# Patient Record
Sex: Female | Born: 1935 | Race: White | Hispanic: No | Marital: Married
Health system: Southern US, Community
[De-identification: ages and names within clinical notes are randomized; demographics above are authoritative.]

## PROBLEM LIST (undated history)

## (undated) ENCOUNTER — Emergency Department (HOSPITAL_COMMUNITY): Admission: EM | Disposition: A | Discharge status: Home / Self Care | Payer: Medicare PPO

## (undated) DIAGNOSIS — K7581 Nonalcoholic steatohepatitis (NASH): Secondary | ICD-10-CM

## (undated) DIAGNOSIS — I495 Sick sinus syndrome: Secondary | ICD-10-CM

## (undated) DIAGNOSIS — I619 Nontraumatic intracerebral hemorrhage, unspecified: Secondary | ICD-10-CM

## (undated) DIAGNOSIS — I639 Cerebral infarction, unspecified: Secondary | ICD-10-CM

## (undated) DIAGNOSIS — I071 Rheumatic tricuspid insufficiency: Secondary | ICD-10-CM

## (undated) DIAGNOSIS — K625 Hemorrhage of anus and rectum: Secondary | ICD-10-CM

## (undated) DIAGNOSIS — K649 Unspecified hemorrhoids: Secondary | ICD-10-CM

## (undated) DIAGNOSIS — Z950 Presence of cardiac pacemaker: Secondary | ICD-10-CM

## (undated) DIAGNOSIS — R600 Localized edema: Secondary | ICD-10-CM

## (undated) DIAGNOSIS — Z9289 Personal history of other medical treatment: Secondary | ICD-10-CM

## (undated) DIAGNOSIS — Z87440 Personal history of urinary (tract) infections: Secondary | ICD-10-CM

## (undated) DIAGNOSIS — E785 Hyperlipidemia, unspecified: Secondary | ICD-10-CM

## (undated) DIAGNOSIS — M353 Polymyalgia rheumatica: Secondary | ICD-10-CM

## (undated) DIAGNOSIS — E039 Hypothyroidism, unspecified: Secondary | ICD-10-CM

## (undated) DIAGNOSIS — I712 Thoracic aortic aneurysm, without rupture, unspecified: Secondary | ICD-10-CM

## (undated) DIAGNOSIS — Z860101 Personal history of adenomatous and serrated colon polyps: Secondary | ICD-10-CM

## (undated) DIAGNOSIS — M1990 Unspecified osteoarthritis, unspecified site: Secondary | ICD-10-CM

## (undated) DIAGNOSIS — I4821 Permanent atrial fibrillation: Secondary | ICD-10-CM

## (undated) DIAGNOSIS — I872 Venous insufficiency (chronic) (peripheral): Secondary | ICD-10-CM

## (undated) DIAGNOSIS — K759 Inflammatory liver disease, unspecified: Secondary | ICD-10-CM

## (undated) DIAGNOSIS — Z8673 Personal history of transient ischemic attack (TIA), and cerebral infarction without residual deficits: Secondary | ICD-10-CM

## (undated) DIAGNOSIS — Z87442 Personal history of urinary calculi: Secondary | ICD-10-CM

## (undated) DIAGNOSIS — I2510 Atherosclerotic heart disease of native coronary artery without angina pectoris: Secondary | ICD-10-CM

## (undated) DIAGNOSIS — G709 Myoneural disorder, unspecified: Secondary | ICD-10-CM

## (undated) DIAGNOSIS — R001 Bradycardia, unspecified: Secondary | ICD-10-CM

## (undated) DIAGNOSIS — I499 Cardiac arrhythmia, unspecified: Secondary | ICD-10-CM

## (undated) DIAGNOSIS — I5032 Chronic diastolic (congestive) heart failure: Secondary | ICD-10-CM

## (undated) DIAGNOSIS — Z86010 Personal history of colonic polyps: Secondary | ICD-10-CM

## (undated) DIAGNOSIS — N390 Urinary tract infection, site not specified: Secondary | ICD-10-CM

## (undated) DIAGNOSIS — I10 Essential (primary) hypertension: Secondary | ICD-10-CM

## (undated) HISTORY — PX: HERNIA REPAIR: SHX51

## (undated) HISTORY — DX: Cerebral infarction, unspecified: I63.9

## (undated) HISTORY — DX: Thoracic aortic aneurysm, without rupture, unspecified: I71.20

## (undated) HISTORY — DX: Bradycardia, unspecified: R00.1

## (undated) HISTORY — PX: BREAST SURGERY: SHX581

## (undated) HISTORY — PX: CARDIAC CATHETERIZATION: SHX172

## (undated) HISTORY — DX: Atherosclerotic heart disease of native coronary artery without angina pectoris: I25.10

## (undated) HISTORY — PX: CARDIAC PACEMAKER PLACEMENT: SHX583

## (undated) HISTORY — DX: Localized edema: R60.0

## (undated) HISTORY — DX: Personal history of other medical treatment: Z92.89

## (undated) HISTORY — DX: Presence of cardiac pacemaker: Z95.0

## (undated) HISTORY — DX: Rheumatic tricuspid insufficiency: I07.1

## (undated) HISTORY — PX: UMBILICAL HERNIA REPAIR: SHX196

## (undated) HISTORY — DX: Permanent atrial fibrillation: I48.21

## (undated) HISTORY — DX: Chronic diastolic (congestive) heart failure: I50.32

## (undated) HISTORY — DX: Polymyalgia rheumatica: M35.3

## (undated) HISTORY — DX: Nonalcoholic steatohepatitis (NASH): K75.81

## (undated) HISTORY — DX: Essential (primary) hypertension: I10

## (undated) HISTORY — DX: Thoracic aortic aneurysm, without rupture: I71.2

## (undated) HISTORY — PX: CYSTOSCOPY WITH RETROGRADE PYELOGRAM, URETEROSCOPY AND STENT PLACEMENT: SHX5789

## (undated) HISTORY — DX: Hyperlipidemia, unspecified: E78.5

---

## 1971-07-16 HISTORY — PX: INCISION AND DRAINAGE BREAST ABSCESS: SUR672

## 1998-03-08 ENCOUNTER — Other Ambulatory Visit: Admission: RE | Admit: 1998-03-08 | Discharge: 1998-03-08 | Payer: Self-pay | Admitting: Obstetrics and Gynecology

## 1999-02-23 ENCOUNTER — Encounter: Payer: Self-pay | Admitting: Family Medicine

## 1999-02-23 ENCOUNTER — Ambulatory Visit (HOSPITAL_COMMUNITY): Admission: RE | Admit: 1999-02-23 | Discharge: 1999-02-23 | Payer: Self-pay | Admitting: Family Medicine

## 1999-02-23 ENCOUNTER — Other Ambulatory Visit: Admission: RE | Admit: 1999-02-23 | Discharge: 1999-02-23 | Payer: Self-pay | Admitting: Family Medicine

## 1999-07-12 ENCOUNTER — Encounter: Admission: RE | Admit: 1999-07-12 | Discharge: 1999-07-12 | Payer: Self-pay | Admitting: Family Medicine

## 1999-07-12 ENCOUNTER — Encounter: Payer: Self-pay | Admitting: Family Medicine

## 1999-11-22 ENCOUNTER — Ambulatory Visit (HOSPITAL_COMMUNITY): Admission: RE | Admit: 1999-11-22 | Discharge: 1999-11-22 | Payer: Self-pay | Admitting: Family Medicine

## 1999-11-22 ENCOUNTER — Encounter: Payer: Self-pay | Admitting: Family Medicine

## 2000-01-08 ENCOUNTER — Encounter: Payer: Self-pay | Admitting: Family Medicine

## 2000-01-08 ENCOUNTER — Encounter: Admission: RE | Admit: 2000-01-08 | Discharge: 2000-01-08 | Payer: Self-pay | Admitting: Family Medicine

## 2000-02-26 ENCOUNTER — Other Ambulatory Visit: Admission: RE | Admit: 2000-02-26 | Discharge: 2000-02-26 | Payer: Self-pay | Admitting: Family Medicine

## 2001-01-13 ENCOUNTER — Encounter: Admission: RE | Admit: 2001-01-13 | Discharge: 2001-01-13 | Payer: Self-pay | Admitting: Family Medicine

## 2001-01-13 ENCOUNTER — Encounter: Payer: Self-pay | Admitting: Family Medicine

## 2001-02-26 ENCOUNTER — Other Ambulatory Visit: Admission: RE | Admit: 2001-02-26 | Discharge: 2001-02-26 | Payer: Self-pay | Admitting: Family Medicine

## 2001-02-27 ENCOUNTER — Encounter: Admission: RE | Admit: 2001-02-27 | Discharge: 2001-02-27 | Payer: Self-pay | Admitting: Family Medicine

## 2001-02-27 ENCOUNTER — Encounter: Payer: Self-pay | Admitting: Family Medicine

## 2002-01-20 ENCOUNTER — Encounter: Admission: RE | Admit: 2002-01-20 | Discharge: 2002-01-20 | Payer: Self-pay | Admitting: Family Medicine

## 2002-01-20 ENCOUNTER — Encounter: Payer: Self-pay | Admitting: Family Medicine

## 2002-03-01 ENCOUNTER — Other Ambulatory Visit: Admission: RE | Admit: 2002-03-01 | Discharge: 2002-03-01 | Payer: Self-pay | Admitting: Family Medicine

## 2003-01-25 ENCOUNTER — Encounter: Admission: RE | Admit: 2003-01-25 | Discharge: 2003-01-25 | Payer: Self-pay | Admitting: Family Medicine

## 2003-01-25 ENCOUNTER — Encounter: Payer: Self-pay | Admitting: Family Medicine

## 2003-03-03 ENCOUNTER — Other Ambulatory Visit: Admission: RE | Admit: 2003-03-03 | Discharge: 2003-03-03 | Payer: Self-pay | Admitting: Family Medicine

## 2003-03-08 ENCOUNTER — Encounter: Admission: RE | Admit: 2003-03-08 | Discharge: 2003-03-23 | Payer: Self-pay | Admitting: Family Medicine

## 2003-03-29 ENCOUNTER — Encounter: Admission: RE | Admit: 2003-03-29 | Discharge: 2003-03-29 | Payer: Self-pay | Admitting: Family Medicine

## 2003-03-29 ENCOUNTER — Encounter: Payer: Self-pay | Admitting: Family Medicine

## 2004-01-31 ENCOUNTER — Encounter: Admission: RE | Admit: 2004-01-31 | Discharge: 2004-01-31 | Payer: Self-pay | Admitting: Family Medicine

## 2004-05-16 ENCOUNTER — Ambulatory Visit: Payer: Self-pay | Admitting: Gastroenterology

## 2004-05-22 ENCOUNTER — Ambulatory Visit: Payer: Self-pay | Admitting: Cardiology

## 2004-05-29 ENCOUNTER — Ambulatory Visit: Payer: Self-pay | Admitting: Cardiology

## 2004-06-12 ENCOUNTER — Ambulatory Visit: Payer: Self-pay | Admitting: Cardiovascular Disease

## 2004-06-12 ENCOUNTER — Ambulatory Visit: Payer: Self-pay | Admitting: Internal Medicine

## 2004-06-25 ENCOUNTER — Ambulatory Visit: Payer: Self-pay | Admitting: Cardiology

## 2004-07-10 ENCOUNTER — Ambulatory Visit: Payer: Self-pay | Admitting: Internal Medicine

## 2004-07-23 ENCOUNTER — Ambulatory Visit: Payer: Self-pay | Admitting: Cardiovascular Disease

## 2004-08-13 ENCOUNTER — Ambulatory Visit: Payer: Self-pay | Admitting: Internal Medicine

## 2004-08-27 ENCOUNTER — Ambulatory Visit: Payer: Self-pay | Admitting: Cardiology

## 2004-09-03 ENCOUNTER — Ambulatory Visit: Payer: Self-pay | Admitting: *Deleted

## 2004-09-13 ENCOUNTER — Encounter: Admission: RE | Admit: 2004-09-13 | Discharge: 2004-09-13 | Payer: Self-pay | Admitting: Family Medicine

## 2004-09-17 ENCOUNTER — Ambulatory Visit: Payer: Self-pay | Admitting: Cardiovascular Disease

## 2004-10-08 ENCOUNTER — Ambulatory Visit: Payer: Self-pay | Admitting: Cardiology

## 2004-11-05 ENCOUNTER — Ambulatory Visit: Payer: Self-pay | Admitting: Cardiology

## 2004-11-19 ENCOUNTER — Ambulatory Visit: Payer: Self-pay | Admitting: Internal Medicine

## 2004-11-23 ENCOUNTER — Ambulatory Visit: Payer: Self-pay | Admitting: Cardiology

## 2004-12-07 ENCOUNTER — Ambulatory Visit: Payer: Self-pay | Admitting: Cardiology

## 2004-12-12 ENCOUNTER — Ambulatory Visit: Payer: Self-pay | Admitting: Internal Medicine

## 2004-12-28 ENCOUNTER — Ambulatory Visit: Payer: Self-pay | Admitting: Cardiology

## 2005-01-21 ENCOUNTER — Ambulatory Visit: Payer: Self-pay | Admitting: Internal Medicine

## 2005-01-31 ENCOUNTER — Encounter: Admission: RE | Admit: 2005-01-31 | Discharge: 2005-01-31 | Payer: Self-pay | Admitting: Family Medicine

## 2005-02-04 ENCOUNTER — Ambulatory Visit: Payer: Self-pay | Admitting: Internal Medicine

## 2005-02-25 ENCOUNTER — Ambulatory Visit: Payer: Self-pay | Admitting: Internal Medicine

## 2005-03-19 ENCOUNTER — Other Ambulatory Visit: Admission: RE | Admit: 2005-03-19 | Discharge: 2005-03-19 | Payer: Self-pay | Admitting: Family Medicine

## 2005-03-22 ENCOUNTER — Encounter: Admission: RE | Admit: 2005-03-22 | Discharge: 2005-03-22 | Payer: Self-pay | Admitting: Family Medicine

## 2005-03-25 ENCOUNTER — Ambulatory Visit: Payer: Self-pay | Admitting: Cardiology

## 2005-04-22 ENCOUNTER — Ambulatory Visit: Payer: Self-pay | Admitting: Cardiology

## 2005-05-17 ENCOUNTER — Ambulatory Visit: Payer: Self-pay | Admitting: Cardiology

## 2005-05-29 ENCOUNTER — Ambulatory Visit: Payer: Self-pay | Admitting: Internal Medicine

## 2005-06-03 ENCOUNTER — Ambulatory Visit: Payer: Self-pay | Admitting: Cardiology

## 2005-07-04 ENCOUNTER — Ambulatory Visit: Payer: Self-pay | Admitting: Cardiology

## 2005-07-19 ENCOUNTER — Encounter: Admission: RE | Admit: 2005-07-19 | Discharge: 2005-07-19 | Payer: Self-pay | Admitting: Family Medicine

## 2005-07-22 ENCOUNTER — Ambulatory Visit: Payer: Self-pay | Admitting: Cardiology

## 2005-08-12 ENCOUNTER — Ambulatory Visit: Payer: Self-pay | Admitting: Cardiology

## 2005-09-02 ENCOUNTER — Ambulatory Visit: Payer: Self-pay | Admitting: Cardiology

## 2005-09-23 ENCOUNTER — Ambulatory Visit: Payer: Self-pay | Admitting: Internal Medicine

## 2005-10-21 ENCOUNTER — Ambulatory Visit: Payer: Self-pay | Admitting: Internal Medicine

## 2005-11-12 ENCOUNTER — Ambulatory Visit: Payer: Self-pay | Admitting: Internal Medicine

## 2005-11-18 ENCOUNTER — Ambulatory Visit: Payer: Self-pay | Admitting: Cardiology

## 2005-12-02 ENCOUNTER — Ambulatory Visit: Payer: Self-pay | Admitting: Cardiology

## 2005-12-23 ENCOUNTER — Ambulatory Visit: Payer: Self-pay | Admitting: Cardiology

## 2006-01-13 ENCOUNTER — Ambulatory Visit: Payer: Self-pay | Admitting: Cardiology

## 2006-01-20 ENCOUNTER — Ambulatory Visit: Payer: Self-pay | Admitting: Internal Medicine

## 2006-01-31 ENCOUNTER — Ambulatory Visit: Payer: Self-pay | Admitting: Cardiology

## 2006-02-07 ENCOUNTER — Encounter: Admission: RE | Admit: 2006-02-07 | Discharge: 2006-02-07 | Payer: Self-pay | Admitting: Family Medicine

## 2006-02-11 ENCOUNTER — Ambulatory Visit: Payer: Self-pay | Admitting: Cardiology

## 2006-02-25 ENCOUNTER — Ambulatory Visit: Payer: Self-pay | Admitting: Cardiology

## 2006-03-24 ENCOUNTER — Ambulatory Visit: Payer: Self-pay | Admitting: Cardiology

## 2006-04-21 ENCOUNTER — Ambulatory Visit: Payer: Self-pay | Admitting: Cardiovascular Disease

## 2006-04-29 ENCOUNTER — Ambulatory Visit: Payer: Self-pay | Admitting: Cardiology

## 2006-05-09 ENCOUNTER — Ambulatory Visit: Payer: Self-pay | Admitting: Internal Medicine

## 2006-05-23 ENCOUNTER — Ambulatory Visit: Payer: Self-pay | Admitting: Internal Medicine

## 2006-06-09 ENCOUNTER — Ambulatory Visit: Payer: Self-pay | Admitting: Cardiology

## 2006-06-20 ENCOUNTER — Ambulatory Visit: Payer: Self-pay | Admitting: Cardiology

## 2006-07-10 ENCOUNTER — Ambulatory Visit: Payer: Self-pay | Admitting: Internal Medicine

## 2006-07-31 ENCOUNTER — Ambulatory Visit: Payer: Self-pay | Admitting: *Deleted

## 2006-08-07 ENCOUNTER — Ambulatory Visit: Payer: Self-pay | Admitting: Cardiology

## 2006-08-21 ENCOUNTER — Ambulatory Visit: Payer: Self-pay | Admitting: Cardiology

## 2006-09-05 ENCOUNTER — Ambulatory Visit: Payer: Self-pay | Admitting: Internal Medicine

## 2006-09-17 ENCOUNTER — Ambulatory Visit: Payer: Self-pay | Admitting: Cardiology

## 2006-09-26 ENCOUNTER — Ambulatory Visit: Payer: Self-pay | Admitting: Cardiology

## 2006-10-06 ENCOUNTER — Ambulatory Visit: Payer: Self-pay | Admitting: Internal Medicine

## 2006-10-20 ENCOUNTER — Ambulatory Visit: Payer: Self-pay | Admitting: Cardiovascular Disease

## 2006-10-27 ENCOUNTER — Encounter: Admission: RE | Admit: 2006-10-27 | Discharge: 2006-10-27 | Payer: Self-pay | Admitting: Family Medicine

## 2006-11-10 ENCOUNTER — Ambulatory Visit: Payer: Self-pay | Admitting: Cardiology

## 2006-11-28 ENCOUNTER — Ambulatory Visit: Payer: Self-pay | Admitting: Internal Medicine

## 2006-11-28 ENCOUNTER — Ambulatory Visit (HOSPITAL_COMMUNITY): Admission: RE | Admit: 2006-11-28 | Discharge: 2006-11-28 | Payer: Self-pay | Admitting: Internal Medicine

## 2006-11-28 ENCOUNTER — Emergency Department (HOSPITAL_COMMUNITY): Admission: EM | Admit: 2006-11-28 | Discharge: 2006-11-29 | Payer: Self-pay | Admitting: Emergency Medicine

## 2006-11-28 LAB — CONVERTED CEMR LAB
BUN: 17 mg/dL (ref 6–23)
CO2: 31 meq/L (ref 19–32)
Calcium: 9.9 mg/dL (ref 8.4–10.5)
Chloride: 101 meq/L (ref 96–112)
Creatinine, Ser: 1 mg/dL (ref 0.4–1.2)
INR: 2.1 — ABNORMAL HIGH (ref 0.9–2.0)
MCHC: 33.4 g/dL (ref 30.0–36.0)
MCV: 96.1 fL (ref 78.0–100.0)
Platelets: 253 10*3/uL (ref 150–400)
RBC: 4.41 M/uL (ref 3.87–5.11)
RDW: 13.4 % (ref 11.5–14.6)
WBC: 6.8 10*3/uL (ref 4.5–10.5)

## 2006-11-29 ENCOUNTER — Emergency Department (HOSPITAL_COMMUNITY): Admission: EM | Admit: 2006-11-29 | Discharge: 2006-11-30 | Payer: Self-pay | Admitting: Emergency Medicine

## 2006-12-02 ENCOUNTER — Encounter: Admission: RE | Admit: 2006-12-02 | Discharge: 2006-12-02 | Payer: Self-pay | Admitting: Gastroenterology

## 2006-12-05 ENCOUNTER — Ambulatory Visit: Payer: Self-pay | Admitting: Cardiovascular Disease

## 2006-12-05 ENCOUNTER — Inpatient Hospital Stay (HOSPITAL_BASED_OUTPATIENT_CLINIC_OR_DEPARTMENT_OTHER): Admission: RE | Admit: 2006-12-05 | Discharge: 2006-12-05 | Payer: Self-pay | Admitting: Cardiovascular Disease

## 2006-12-10 ENCOUNTER — Encounter: Admission: RE | Admit: 2006-12-10 | Discharge: 2006-12-10 | Payer: Self-pay | Admitting: Gastroenterology

## 2006-12-12 ENCOUNTER — Ambulatory Visit: Payer: Self-pay | Admitting: Internal Medicine

## 2006-12-12 LAB — CONVERTED CEMR LAB
INR: 5.2 — ABNORMAL HIGH (ref 0.9–2.0)
Prothrombin Time: 29.5 s — ABNORMAL HIGH (ref 10.0–14.0)

## 2006-12-24 ENCOUNTER — Ambulatory Visit: Payer: Self-pay | Admitting: Cardiology

## 2006-12-30 ENCOUNTER — Ambulatory Visit: Payer: Self-pay | Admitting: Internal Medicine

## 2007-01-05 ENCOUNTER — Ambulatory Visit: Payer: Self-pay | Admitting: Cardiology

## 2007-01-12 ENCOUNTER — Ambulatory Visit: Payer: Self-pay | Admitting: Cardiology

## 2007-01-19 ENCOUNTER — Ambulatory Visit: Payer: Self-pay | Admitting: Cardiology

## 2007-01-27 ENCOUNTER — Ambulatory Visit: Payer: Self-pay | Admitting: Cardiology

## 2007-02-09 ENCOUNTER — Ambulatory Visit: Payer: Self-pay | Admitting: Cardiology

## 2007-02-09 ENCOUNTER — Encounter: Admission: RE | Admit: 2007-02-09 | Discharge: 2007-02-09 | Payer: Self-pay | Admitting: Family Medicine

## 2007-02-23 ENCOUNTER — Ambulatory Visit: Payer: Self-pay | Admitting: Internal Medicine

## 2007-03-09 ENCOUNTER — Ambulatory Visit: Payer: Self-pay | Admitting: Cardiology

## 2007-03-23 ENCOUNTER — Ambulatory Visit: Payer: Self-pay | Admitting: Cardiology

## 2007-04-02 ENCOUNTER — Other Ambulatory Visit: Admission: RE | Admit: 2007-04-02 | Discharge: 2007-04-02 | Payer: Self-pay | Admitting: Family Medicine

## 2007-04-07 ENCOUNTER — Encounter: Admission: RE | Admit: 2007-04-07 | Discharge: 2007-04-07 | Payer: Self-pay | Admitting: Family Medicine

## 2007-04-10 ENCOUNTER — Encounter: Admission: RE | Admit: 2007-04-10 | Discharge: 2007-04-10 | Payer: Self-pay | Admitting: Gastroenterology

## 2007-04-13 ENCOUNTER — Ambulatory Visit: Payer: Self-pay | Admitting: Cardiology

## 2007-05-11 ENCOUNTER — Ambulatory Visit: Payer: Self-pay | Admitting: Cardiology

## 2007-05-27 ENCOUNTER — Ambulatory Visit: Payer: Self-pay | Admitting: Internal Medicine

## 2007-06-08 ENCOUNTER — Ambulatory Visit: Payer: Self-pay | Admitting: Cardiology

## 2007-07-01 ENCOUNTER — Ambulatory Visit: Payer: Self-pay | Admitting: Cardiovascular Disease

## 2007-07-29 ENCOUNTER — Ambulatory Visit: Payer: Self-pay | Admitting: Internal Medicine

## 2007-08-17 ENCOUNTER — Ambulatory Visit: Payer: Self-pay | Admitting: Internal Medicine

## 2007-09-15 ENCOUNTER — Ambulatory Visit: Payer: Self-pay | Admitting: Internal Medicine

## 2007-10-05 ENCOUNTER — Ambulatory Visit: Payer: Self-pay | Admitting: Cardiovascular Disease

## 2007-10-19 ENCOUNTER — Ambulatory Visit: Payer: Self-pay | Admitting: Cardiology

## 2007-11-02 ENCOUNTER — Ambulatory Visit: Payer: Self-pay | Admitting: Internal Medicine

## 2007-11-16 ENCOUNTER — Ambulatory Visit: Payer: Self-pay | Admitting: Cardiovascular Disease

## 2007-12-03 ENCOUNTER — Ambulatory Visit: Payer: Self-pay | Admitting: Internal Medicine

## 2007-12-08 ENCOUNTER — Ambulatory Visit: Payer: Self-pay | Admitting: Cardiology

## 2008-01-01 ENCOUNTER — Ambulatory Visit: Payer: Self-pay | Admitting: Internal Medicine

## 2008-02-01 ENCOUNTER — Ambulatory Visit: Payer: Self-pay | Admitting: Cardiology

## 2008-02-03 ENCOUNTER — Emergency Department (HOSPITAL_COMMUNITY): Admission: EM | Admit: 2008-02-03 | Discharge: 2008-02-03 | Payer: Self-pay | Admitting: Emergency Medicine

## 2008-02-10 ENCOUNTER — Encounter: Admission: RE | Admit: 2008-02-10 | Discharge: 2008-02-10 | Payer: Self-pay | Admitting: Family Medicine

## 2008-02-25 ENCOUNTER — Ambulatory Visit: Payer: Self-pay | Admitting: Internal Medicine

## 2008-02-29 ENCOUNTER — Ambulatory Visit: Payer: Self-pay | Admitting: Internal Medicine

## 2008-03-28 ENCOUNTER — Ambulatory Visit: Payer: Self-pay | Admitting: Cardiology

## 2008-04-25 ENCOUNTER — Ambulatory Visit: Payer: Self-pay | Admitting: Cardiology

## 2008-04-28 DIAGNOSIS — I10 Essential (primary) hypertension: Secondary | ICD-10-CM | POA: Insufficient documentation

## 2008-04-28 DIAGNOSIS — I4891 Unspecified atrial fibrillation: Secondary | ICD-10-CM | POA: Insufficient documentation

## 2008-05-23 ENCOUNTER — Ambulatory Visit: Payer: Self-pay | Admitting: Cardiovascular Disease

## 2008-05-23 ENCOUNTER — Ambulatory Visit: Payer: Self-pay | Admitting: Internal Medicine

## 2008-06-13 ENCOUNTER — Ambulatory Visit: Payer: Self-pay | Admitting: Cardiovascular Disease

## 2008-06-27 ENCOUNTER — Ambulatory Visit: Payer: Self-pay | Admitting: Cardiology

## 2008-07-18 ENCOUNTER — Ambulatory Visit: Payer: Self-pay | Admitting: Cardiology

## 2008-09-12 ENCOUNTER — Ambulatory Visit: Payer: Self-pay | Admitting: Cardiovascular Disease

## 2008-10-10 ENCOUNTER — Ambulatory Visit: Payer: Self-pay | Admitting: Internal Medicine

## 2008-11-07 ENCOUNTER — Ambulatory Visit: Payer: Self-pay | Admitting: Internal Medicine

## 2008-11-17 ENCOUNTER — Ambulatory Visit: Payer: Self-pay | Admitting: Internal Medicine

## 2008-11-17 ENCOUNTER — Encounter: Payer: Self-pay | Admitting: Cardiology

## 2008-11-17 DIAGNOSIS — I495 Sick sinus syndrome: Secondary | ICD-10-CM | POA: Insufficient documentation

## 2008-11-18 ENCOUNTER — Telehealth: Payer: Self-pay | Admitting: Internal Medicine

## 2008-11-29 ENCOUNTER — Ambulatory Visit: Payer: Self-pay | Admitting: Internal Medicine

## 2008-12-05 ENCOUNTER — Ambulatory Visit: Payer: Self-pay | Admitting: Cardiology

## 2008-12-14 ENCOUNTER — Encounter: Payer: Self-pay | Admitting: *Deleted

## 2009-01-02 ENCOUNTER — Ambulatory Visit: Payer: Self-pay | Admitting: Internal Medicine

## 2009-01-02 LAB — CONVERTED CEMR LAB
POC INR: 2
Protime: 17.3

## 2009-01-05 ENCOUNTER — Encounter: Admission: RE | Admit: 2009-01-05 | Discharge: 2009-01-05 | Payer: Self-pay | Admitting: Internal Medicine

## 2009-01-18 ENCOUNTER — Encounter: Payer: Self-pay | Admitting: *Deleted

## 2009-01-30 ENCOUNTER — Ambulatory Visit: Payer: Self-pay | Admitting: Cardiovascular Disease

## 2009-01-30 LAB — CONVERTED CEMR LAB
POC INR: 2.4
Prothrombin Time: 19 s

## 2009-02-10 ENCOUNTER — Encounter: Admission: RE | Admit: 2009-02-10 | Discharge: 2009-02-10 | Payer: Self-pay | Admitting: Internal Medicine

## 2009-02-27 ENCOUNTER — Ambulatory Visit: Payer: Self-pay | Admitting: Cardiology

## 2009-02-27 LAB — CONVERTED CEMR LAB: POC INR: 3.1

## 2009-03-27 ENCOUNTER — Ambulatory Visit: Payer: Self-pay | Admitting: Internal Medicine

## 2009-03-29 ENCOUNTER — Ambulatory Visit: Payer: Self-pay | Admitting: Internal Medicine

## 2009-04-07 ENCOUNTER — Telehealth: Payer: Self-pay | Admitting: Internal Medicine

## 2009-04-10 ENCOUNTER — Telehealth: Payer: Self-pay | Admitting: Internal Medicine

## 2009-04-24 ENCOUNTER — Ambulatory Visit: Payer: Self-pay | Admitting: Internal Medicine

## 2009-04-24 LAB — CONVERTED CEMR LAB: POC INR: 2.5

## 2009-04-25 ENCOUNTER — Encounter (INDEPENDENT_AMBULATORY_CARE_PROVIDER_SITE_OTHER): Payer: Self-pay | Admitting: *Deleted

## 2009-05-22 ENCOUNTER — Ambulatory Visit: Payer: Self-pay | Admitting: Cardiology

## 2009-06-05 ENCOUNTER — Telehealth: Payer: Self-pay | Admitting: Internal Medicine

## 2009-06-13 ENCOUNTER — Ambulatory Visit: Payer: Self-pay | Admitting: Cardiology

## 2009-06-13 LAB — CONVERTED CEMR LAB: POC INR: 3.3

## 2009-06-26 ENCOUNTER — Encounter: Payer: Self-pay | Admitting: Internal Medicine

## 2009-06-26 ENCOUNTER — Ambulatory Visit: Payer: Self-pay | Admitting: Cardiology

## 2009-06-26 LAB — CONVERTED CEMR LAB: POC INR: 2.7

## 2009-06-29 ENCOUNTER — Encounter: Payer: Self-pay | Admitting: Internal Medicine

## 2009-07-17 ENCOUNTER — Telehealth (INDEPENDENT_AMBULATORY_CARE_PROVIDER_SITE_OTHER): Payer: Self-pay | Admitting: *Deleted

## 2009-07-24 ENCOUNTER — Ambulatory Visit: Payer: Self-pay | Admitting: Cardiology

## 2009-07-24 LAB — CONVERTED CEMR LAB: POC INR: 2.5

## 2009-07-26 ENCOUNTER — Telehealth: Payer: Self-pay | Admitting: Internal Medicine

## 2009-07-27 ENCOUNTER — Telehealth: Payer: Self-pay | Admitting: Internal Medicine

## 2009-08-01 ENCOUNTER — Telehealth: Payer: Self-pay | Admitting: Internal Medicine

## 2009-08-01 ENCOUNTER — Telehealth (INDEPENDENT_AMBULATORY_CARE_PROVIDER_SITE_OTHER): Payer: Self-pay | Admitting: Cardiology

## 2009-08-02 ENCOUNTER — Encounter: Payer: Self-pay | Admitting: Internal Medicine

## 2009-08-04 ENCOUNTER — Encounter: Payer: Self-pay | Admitting: Internal Medicine

## 2009-08-04 ENCOUNTER — Telehealth: Payer: Self-pay | Admitting: Internal Medicine

## 2009-08-04 ENCOUNTER — Ambulatory Visit: Payer: Self-pay | Admitting: Cardiology

## 2009-08-07 ENCOUNTER — Ambulatory Visit: Payer: Self-pay | Admitting: Cardiology

## 2009-08-08 ENCOUNTER — Encounter: Payer: Self-pay | Admitting: Cardiology

## 2009-08-14 ENCOUNTER — Ambulatory Visit: Payer: Self-pay | Admitting: Cardiology

## 2009-08-14 LAB — CONVERTED CEMR LAB: POC INR: 2.2

## 2009-08-21 ENCOUNTER — Ambulatory Visit: Payer: Self-pay | Admitting: Cardiology

## 2009-08-28 ENCOUNTER — Ambulatory Visit: Payer: Self-pay | Admitting: Cardiology

## 2009-08-31 ENCOUNTER — Telehealth (INDEPENDENT_AMBULATORY_CARE_PROVIDER_SITE_OTHER): Payer: Self-pay | Admitting: *Deleted

## 2009-09-01 ENCOUNTER — Ambulatory Visit: Payer: Self-pay | Admitting: Internal Medicine

## 2009-09-04 ENCOUNTER — Ambulatory Visit: Payer: Self-pay | Admitting: Cardiovascular Disease

## 2009-09-04 LAB — CONVERTED CEMR LAB: POC INR: 4.1

## 2009-09-08 ENCOUNTER — Telehealth: Payer: Self-pay | Admitting: Cardiology

## 2009-09-11 ENCOUNTER — Telehealth (INDEPENDENT_AMBULATORY_CARE_PROVIDER_SITE_OTHER): Payer: Self-pay | Admitting: *Deleted

## 2009-09-14 ENCOUNTER — Ambulatory Visit: Payer: Self-pay | Admitting: Internal Medicine

## 2009-09-14 LAB — CONVERTED CEMR LAB: POC INR: 2.2

## 2009-09-22 ENCOUNTER — Telehealth (INDEPENDENT_AMBULATORY_CARE_PROVIDER_SITE_OTHER): Payer: Self-pay | Admitting: *Deleted

## 2009-09-22 ENCOUNTER — Encounter: Payer: Self-pay | Admitting: Cardiology

## 2009-09-22 ENCOUNTER — Ambulatory Visit: Payer: Self-pay | Admitting: Cardiology

## 2009-09-27 ENCOUNTER — Ambulatory Visit: Payer: Self-pay | Admitting: Internal Medicine

## 2009-09-27 LAB — CONVERTED CEMR LAB: POC INR: 3.9

## 2009-10-02 ENCOUNTER — Ambulatory Visit: Payer: Self-pay | Admitting: Internal Medicine

## 2009-10-02 ENCOUNTER — Encounter (INDEPENDENT_AMBULATORY_CARE_PROVIDER_SITE_OTHER): Payer: Self-pay | Admitting: Cardiology

## 2009-10-02 LAB — CONVERTED CEMR LAB: POC INR: 1.9

## 2009-10-16 ENCOUNTER — Ambulatory Visit: Payer: Self-pay | Admitting: Internal Medicine

## 2009-10-16 LAB — CONVERTED CEMR LAB: POC INR: 1.4

## 2009-10-17 ENCOUNTER — Ambulatory Visit: Payer: Self-pay | Admitting: Internal Medicine

## 2009-10-17 DIAGNOSIS — I4949 Other premature depolarization: Secondary | ICD-10-CM

## 2009-10-24 ENCOUNTER — Telehealth: Payer: Self-pay | Admitting: Internal Medicine

## 2009-10-26 ENCOUNTER — Ambulatory Visit: Payer: Self-pay | Admitting: Cardiology

## 2009-10-26 LAB — CONVERTED CEMR LAB: POC INR: 2.2

## 2009-11-13 ENCOUNTER — Ambulatory Visit: Payer: Self-pay | Admitting: Cardiology

## 2009-11-27 ENCOUNTER — Ambulatory Visit: Payer: Self-pay | Admitting: Internal Medicine

## 2009-12-15 ENCOUNTER — Encounter (INDEPENDENT_AMBULATORY_CARE_PROVIDER_SITE_OTHER): Payer: Self-pay | Admitting: *Deleted

## 2009-12-25 ENCOUNTER — Ambulatory Visit: Payer: Self-pay | Admitting: Cardiovascular Disease

## 2009-12-25 LAB — CONVERTED CEMR LAB: POC INR: 2.9

## 2010-01-08 ENCOUNTER — Telehealth (INDEPENDENT_AMBULATORY_CARE_PROVIDER_SITE_OTHER): Payer: Self-pay | Admitting: *Deleted

## 2010-01-22 ENCOUNTER — Ambulatory Visit: Payer: Self-pay | Admitting: Cardiology

## 2010-02-02 ENCOUNTER — Ambulatory Visit: Payer: Self-pay | Admitting: Cardiology

## 2010-02-12 ENCOUNTER — Encounter: Admission: RE | Admit: 2010-02-12 | Discharge: 2010-02-12 | Payer: Self-pay | Admitting: Internal Medicine

## 2010-02-19 ENCOUNTER — Ambulatory Visit: Payer: Self-pay | Admitting: Internal Medicine

## 2010-02-19 LAB — CONVERTED CEMR LAB: POC INR: 3.3

## 2010-03-12 ENCOUNTER — Ambulatory Visit: Payer: Self-pay | Admitting: Cardiology

## 2010-03-12 LAB — CONVERTED CEMR LAB: POC INR: 3.1

## 2010-04-02 ENCOUNTER — Ambulatory Visit: Payer: Self-pay | Admitting: Internal Medicine

## 2010-04-02 LAB — CONVERTED CEMR LAB: POC INR: 3

## 2010-04-23 ENCOUNTER — Ambulatory Visit: Payer: Self-pay | Admitting: Internal Medicine

## 2010-04-23 LAB — CONVERTED CEMR LAB: POC INR: 2.9

## 2010-04-24 ENCOUNTER — Ambulatory Visit: Payer: Self-pay | Admitting: Internal Medicine

## 2010-05-04 ENCOUNTER — Ambulatory Visit: Payer: Self-pay | Admitting: Internal Medicine

## 2010-05-04 LAB — CONVERTED CEMR LAB: POC INR: 4

## 2010-05-08 ENCOUNTER — Telehealth: Payer: Self-pay | Admitting: Internal Medicine

## 2010-05-21 ENCOUNTER — Ambulatory Visit: Payer: Self-pay | Admitting: Cardiovascular Disease

## 2010-05-21 LAB — CONVERTED CEMR LAB: POC INR: 1.9

## 2010-05-24 ENCOUNTER — Ambulatory Visit: Payer: Self-pay | Admitting: Internal Medicine

## 2010-05-24 ENCOUNTER — Telehealth: Payer: Self-pay | Admitting: Internal Medicine

## 2010-06-06 ENCOUNTER — Ambulatory Visit: Payer: Self-pay | Admitting: Internal Medicine

## 2010-06-18 ENCOUNTER — Ambulatory Visit: Payer: Self-pay | Admitting: Cardiovascular Disease

## 2010-06-25 ENCOUNTER — Encounter: Payer: Self-pay | Admitting: Internal Medicine

## 2010-07-10 ENCOUNTER — Ambulatory Visit: Payer: Self-pay | Admitting: Internal Medicine

## 2010-08-05 ENCOUNTER — Encounter: Payer: Self-pay | Admitting: Gastroenterology

## 2010-08-06 ENCOUNTER — Ambulatory Visit: Admission: RE | Admit: 2010-08-06 | Discharge: 2010-08-06 | Payer: Self-pay | Source: Home / Self Care

## 2010-08-12 LAB — CONVERTED CEMR LAB
BUN: 16 mg/dL (ref 6–23)
CO2: 30 meq/L (ref 19–32)
Calcium: 9.4 mg/dL (ref 8.4–10.5)
Calcium: 9.4 mg/dL (ref 8.4–10.5)
Chloride: 105 meq/L (ref 96–112)
Creatinine, Ser: 0.9 mg/dL (ref 0.4–1.2)
Creatinine, Ser: 0.9 mg/dL (ref 0.4–1.2)
GFR calc non Af Amer: 64.64 mL/min (ref >60)
GFR calc non Af Amer: 64.73 mL/min (ref >60)
Magnesium: 1.9 mg/dL (ref 1.5–2.5)

## 2010-08-13 ENCOUNTER — Encounter: Payer: Self-pay | Admitting: Internal Medicine

## 2010-08-16 NOTE — Medication Information (Signed)
Summary: [name] [name]  Anticoagulant Therapy  Managed by: Tula Nakayama, RN, BSN Referring MD: Virl Axe MD PCP: Dr. [name] Supervising MD: Ron Parker MD, Dellis Filbert Indication 1: Atrial Fibrillation (ICD-427.31) Indication 2: DCCV Pending (08/08/2009) see weekly Lab Used: [facility] Site: [facility] INR POC 3.1 INR RANGE 2 - 3  Dietary changes: no    Health status changes: no    Bleeding/hemorrhagic complications: no    Recent/future hospitalizations: no    Any changes in medication regimen? no    Recent/future dental: no  Any missed doses?: no       Is patient compliant with meds? yes       Allergies: 1)  ! Pcn 2)  ! Codeine 3)  ! * Statins  Anticoagulation Management History:      The patient is taking warfarin and comes in today for a routine follow up visit.  Positive risk factors for bleeding include an age of 75 years or older.  Negative risk factors for bleeding include no history of CVA/TIA.  The bleeding index is 'intermediate risk'.  Positive CHADS2 values include History of HTN and Age > 1 years old.  Negative CHADS2 values include History of Diabetes and Prior Stroke/CVA/TIA.  The start date was 12/24/2002.  Her last INR was 5.2 RATIO.  Anticoagulation responsible Koree Schopf: Ron Parker MD, Dellis Filbert.  INR POC: 3.1.  Cuvette Lot#: [id number].  Exp: 04/2011.    Anticoagulation Management Assessment/Plan:      The patient's current anticoagulation dose is Warfarin sodium 2.5 mg tabs: Use as directed by Anticoagualtion Clinic.  The target INR is 2 - 3.  The next INR is due 04/02/2010.  Anticoagulation instructions were given to patient.  Results were reviewed/authorized by Tula Nakayama, RN, BSN.  She was notified by Tula Nakayama, RN, BSN.         Prior Anticoagulation Instructions: INR 3.3  Skip today's dose.  Then resume taking regular regimen of 2 tablets (32m) every day except take 1 tablet (2.542m on Tues and Thurs.   Current Anticoagulation Instructions: INR 3.1 Today  take only take 2.56m36mthen resume 56mg656mveryday except 2.56mgs33m Tuesdays and Thursdays. Recheck in 3 weeks. Incorporate another serving of leafy green veggie per week.

## 2010-08-16 NOTE — Medication Information (Signed)
Summary: rov/eac  Anticoagulant Therapy  Managed by: Alinda Deem, PharmD, BCPS, CPP Referring MD: Virl Axe MD PCP: Dr. [name] Supervising MD: Harrington Challenger MD, Nevin Bloodgood Indication 1: Atrial Fibrillation (ICD-427.31) Indication 2: DCCV Pending (08/08/2009) see weekly Lab Used: [facility] Site: [facility] INR POC 1.9 INR RANGE 2 - 3  Dietary changes: no    Health status changes: no    Bleeding/hemorrhagic complications: no    Recent/future hospitalizations: no    Any changes in medication regimen? no    Recent/future dental: no  Any missed doses?: no       Is patient compliant with meds? yes       Current Medications (verified): 1)  Acebutolol Hcl 200 Mg Caps (Acebutolol Hcl) .Marland Kitchen.. 1 Tab By Mouth Two Times A Day. 2)  Diovan Hct 320-12.5 Mg Tabs (Valsartan-Hydrochlorothiazide) .... Take One Tablet Once Daily 3)  Propranolol Hcl 10 Mg Tabs (Propranolol Hcl) .... Take 1 Tablet As Needed 4)  Calcium 600/vitamin D 600-400 Mg-Unit Chew (Calcium Carbonate-Vitamin D) .... Take 1 Tablet Two Times A Day 5)  Multivitamins  Tabs (Multiple Vitamin) .... Take One Tablet Once Daily 6)  D 1000 Plus  Tabs (Fa-Cyanocobalamin-B6-D-Ca) .Marland Kitchen.. 1 Tab Daily 7)  Warfarin Sodium 2.5 Mg Tabs (Warfarin Sodium) .... Use As Directed By Anticoagualtion Clinic 8)  Tramadol Hcl 50 Mg Tabs (Tramadol Hcl) .... 1/2 To 1 Tablet Every 6 Hrs As Needed  Allergies (verified): 1)  ! Pcn 2)  ! Codeine 3)  ! * Statins  Anticoagulation Management History:      The patient is taking warfarin and comes in today for a routine follow up visit.  Positive risk factors for bleeding include an age of 75 years or older.  Negative risk factors for bleeding include no history of CVA/TIA.  The bleeding index is 'intermediate risk'.  Positive CHADS2 values include History of HTN and Age > 43 years old.  Negative CHADS2 values include History of Diabetes and Prior Stroke/CVA/TIA.  The start date was 12/24/2002.  Her last INR was 5.2  RATIO.  Anticoagulation responsible provider: Harrington Challenger MD, Nevin Bloodgood.  INR POC: 1.9.  Cuvette Lot#: [id number].  Exp: 11/2010.    Anticoagulation Management Assessment/Plan:      The patient's current anticoagulation dose is Warfarin sodium 2.5 mg tabs: Use as directed by Anticoagualtion Clinic.  The target INR is 2 - 3.  The next INR is due 10/16/2009.  Anticoagulation instructions were given to patient.  Results were reviewed/authorized by Alinda Deem, PharmD, BCPS, CPP.  She was notified by Alinda Deem PharmD, BCPS, CPP.         Prior Anticoagulation Instructions: INR 3.9  Skip today's dosage of coumadin, then take 1 tablet on Thursday and Friday, then resume normal dosage 1 tablet daily except 2 tablets on Mondays, Wednesdays, and Fridays.  Keep Monday appt.  Current Anticoagulation Instructions: INR 1.9  Continue normal dose 1 tab daily except 2 tabs on Monday, Wednesday, Friday.

## 2010-08-16 NOTE — Medication Information (Signed)
Summary: rov/ewj  Anticoagulant Therapy  Managed by: Freddrick March, RN, BSN Referring MD: Virl Axe MD PCP: Dr. [name] Supervising MD: Rayann Heman MD, Jeneen Rinks Indication 1: Atrial Fibrillation (ICD-427.31) Indication 2: DCCV Pending (08/08/2009) see weekly Lab Used: [facility] Site: [facility] INR POC 3.9 INR RANGE 2 - 3  Dietary changes: yes       Details: Still having decr appetite d/t GI bug.  Health status changes: no    Bleeding/hemorrhagic complications: no    Recent/future hospitalizations: no    Any changes in medication regimen? yes       Details: Continue on Flagyl until Friday 09/29/09.    Recent/future dental: no  Any missed doses?: no       Is patient compliant with meds? yes       Allergies: 1)  ! Pcn 2)  ! Codeine 3)  ! * Statins  Anticoagulation Management History:      The patient is taking warfarin and comes in today for a routine follow up visit.  Positive risk factors for bleeding include an age of 75 years or older.  Negative risk factors for bleeding include no history of CVA/TIA.  The bleeding index is 'intermediate risk'.  Positive CHADS2 values include History of HTN and Age > 75 years old.  Negative CHADS2 values include History of Diabetes and Prior Stroke/CVA/TIA.  The start date was 12/24/2002.  Her last INR was 5.2 RATIO.  Anticoagulation responsible provider: Prescious Hurless MD, Jeneen Rinks.  INR POC: 3.9.  Cuvette Lot#: [id number].  Exp: 11/2010.    Anticoagulation Management Assessment/Plan:      The patient's current anticoagulation dose is Warfarin sodium 2.5 mg tabs: Use as directed by Anticoagualtion Clinic.  The target INR is 2 - 3.  The next INR is due 10/02/2009.  Anticoagulation instructions were given to patient.  Results were reviewed/authorized by Freddrick March, RN, BSN.  She was notified by Freddrick March RN.         Prior Anticoagulation Instructions: INR 4.7  Skip Sat and Sun dosages of coumadin, take 1 tablet on Monday and Tuesday, then  recheck on Wednesday.    Current Anticoagulation Instructions: INR 3.9  Skip today's dosage of coumadin, then take 1 tablet on Thursday and Friday, then resume normal dosage 1 tablet daily except 2 tablets on Mondays, Wednesdays, and Fridays.  Keep Monday appt.

## 2010-08-16 NOTE — Progress Notes (Signed)
Summary: questions about coumadin/cortisone injections  Phone Note Call from Patient Call back at Home Phone [phone]   Reason for Call: Talk to Nurse Summary of Call: Received two cortisone injections today (hip and knee)  Will this effect Coumadin?  Does she need earlier appt? Initial call taken by: Alexander Bergeron,  July 17, 2009 3:51 PM  Follow-up for Phone Call        Spoke with pt. she is aware that there is no interaction with coumadin. She aware to call if there are any other concerns or questions.  Follow-up by: Tula Nakayama, RN, BSN,  July 17, 2009 4:44 PM

## 2010-08-16 NOTE — Medication Information (Signed)
Summary: rov/ez  Anticoagulant Therapy  Managed by: Alinda Deem, PharmD Referring MD: Virl Axe MD PCP: [name] Supervising MD: Stanford Breed MD, Aaron Edelman Indication 1: Atrial Fibrillation (ICD-427.31) Lab Used: [facility] Site: [facility] INR POC 2.5 INR RANGE 2 - 3  Dietary changes: no    Health status changes: no    Bleeding/hemorrhagic complications: no    Recent/future hospitalizations: no    Any changes in medication regimen? no       Details: Pt. finished Lovenox on 1/23.  Recent/future dental: no  Any missed doses?: no       Is patient compliant with meds? yes       Allergies: 1)  ! Pcn 2)  ! Codeine 3)  ! * Statins  Anticoagulation Management History:      The patient is taking warfarin and comes in today for a routine follow up visit.  Positive risk factors for bleeding include an age of 75 years or older.  Negative risk factors for bleeding include no history of CVA/TIA.  The bleeding index is 'intermediate risk'.  Positive CHADS2 values include History of HTN and Age > 63 years old.  Negative CHADS2 values include History of Diabetes and Prior Stroke/CVA/TIA.  The start date was 12/24/2002.  Her last INR was 5.2 RATIO.  Anticoagulation responsible provider: Stanford Breed MD, Aaron Edelman.  INR POC: 2.5.  Cuvette Lot#: [id number].  Exp: 08/2010.    Anticoagulation Management Assessment/Plan:      The patient's current anticoagulation dose is Warfarin sodium 2.5 mg tabs: Use as directed by Anticoagualtion Clinic.  The target INR is 2 - 3.  The next INR is due 08/14/2009.  Anticoagulation instructions were given to patient.  Results were reviewed/authorized by Alinda Deem, PharmD.  She was notified by Belenda Cruise, PharmD Candidate.         Prior Anticoagulation Instructions: INR 2.5 Continue same dose. If cleared last dose will be 07/27/09 for Colonoscopy. Follow up on 08/09/09 post procedure.   Current Anticoagulation Instructions: INR 2.5  Stop Lovenox. Continue taking  warfarin 2.2m daily except 547mon Mondays, Wednesdays, and Fridays. Recheck in 1 week.

## 2010-08-16 NOTE — Assessment & Plan Note (Signed)
Summary: per check out/sf   Primary Provider:  Dr. [name]  CC:  follow up.   [name] [name]  History of Present Illness: [name] [name] is seen in followup for paroxysmal atrial fibrillation.  she had a recent 14 day episode that occurred around the timing of her colonoscopy. Because of concerns that she had regarding thromboembolism she was bridged with Lovenox following the procedure. My thanks to GI for that.  Other night she has had only minimal episodes. She thinks that they are "easier now". She's had no palpitations today (see below).    As related to her knee surgery and consideration for placement, cortisone injections have been markedly beneficial     Current Medications (verified): 1)  Acebutolol Hcl 200 Mg Caps (Acebutolol Hcl) .[name] [name].. 1 Tab By Mouth Two Times A Day. 2)  Diovan Hct 320-12.5 Mg Tabs (Valsartan-Hydrochlorothiazide) .... Take One Tablet Once Daily 3)  Propranolol Hcl 10 Mg Tabs (Propranolol Hcl) .... Take 1 Tablet As Needed 4)  Calcium 600/vitamin D 600-400 Mg-Unit Chew (Calcium Carbonate-Vitamin D) .... Take 1 Tablet Two Times A Day 5)  Multivitamins  Tabs (Multiple Vitamin) .... Take One Tablet Once Daily 6)  D 1000 Plus  Tabs (Fa-Cyanocobalamin-B6-D-Ca) .[name] [name].. 1 Tab Daily 7)  Warfarin Sodium 2.5 Mg Tabs (Warfarin Sodium) .... Use As Directed By Anticoagualtion Clinic 8)  Tramadol Hcl 50 Mg Tabs (Tramadol Hcl) .... 1/2 To 1 Tablet Every 6 Hrs As Needed  Allergies (verified): 1)  ! Pcn 2)  ! Codeine 3)  ! * Statins  Past History:  Past Medical History: Last updated: 04/28/2008 1. Paroxysmal atrial fibrillation 2. Bradycardia. 3. Hypertension. 4. Nonobstructive CAD by [facility] 12/05/06 5. NASH 6. PMR, chronically treated with steroids 7. Hyperlipidemia  Past Surgical History: Last updated: 04/28/2008 1.  Umbilical hernia repair 5056P 2.  Breast abscess removed 1973  Family History: Last updated: 04/28/2008 Family History of Coronary Artery Disease:  Father  has Alzheimers Disease  Social History: Last updated: 04/28/2008 Retired Geophysical data processor.   She denies tobacco, ETOH, or drugs.  Vital Signs:  Patient profile:   77 year old female Height:      65 inches Weight:      180 pounds BMI:     30.06 Pulse rate:   57 / minute Pulse rhythm:   regular BP sitting:   136 / 82  (right arm) Cuff size:   regular  Vitals Entered By: Doug Sou CMA (October 17, 2009 9:38 AM)  Physical Exam  General:  The patient was alert and oriented  in no acute distress. HEENT Normal.  Neck veins were flat, carotids were brisk.  Lungs were clear.  Heart sounds were irregular without murmurs or gallops.  Abdomen was soft with active bowel sounds. There is no clubbing cyanosis or edema. Skin Warm and dry affect is engaging    EKG  Procedure date:  10/17/2009  Findings:      sinus rhythm at 57 Intervals 0.15/0.08/0.47 PVCs with a inferior axis morphology-frequent  Impression & Recommendations:  Problem # 1:  PAROXYSMAL ATRIAL FIBRILLATION (ICD-427.31)  She continues to have episodes of atrial fibrillation. She is quite satisfied with the current frequency Her updated medication list for this problem includes:    Acebutolol Hcl 200 Mg Caps (Acebutolol hcl) .[name] [name]... 1 tab by mouth two times a day.    Propranolol Hcl 10 Mg Tabs (Propranolol hcl) .[name] [name]... Take 1 tablet as needed    Warfarin Sodium 2.5 Mg Tabs (Warfarin sodium) .[name] KitchenMarland KitchenMarland KitchenMarland [name]  Use as directed by anticoagualtion clinic  Orders: TLB-BMP (Basic Metabolic Panel-BMET) (74142-[id number]) TLB-Magnesium (Mg) (83735-MG)  Problem # 2:  PREMATURE VENTRICULAR CONTRACTIONS (ICD-427.69) review cardiograms back through the EMR she had PVCs identified in September with identical morphology. Review of her laboratories at that time demonstrated a potassium of 3.5. Will plan to recheck her potassium and her magnesium today. Her updated medication list for this problem includes:    Acebutolol Hcl 200 Mg  Caps (Acebutolol hcl) .[name] [name]... 1 tab by mouth two times a day.    Propranolol Hcl 10 Mg Tabs (Propranolol hcl) .[name] [name]... Take 1 tablet as needed    Warfarin Sodium 2.5 Mg Tabs (Warfarin sodium) ..... Use as directed by anticoagualtion clinic  Patient Instructions: 1)  Your physician recommends that you return for lab work today. 2)  Your physician wants you to follow-up in: 6 months with Dr Caryl Comes.  You will receive a reminder letter in the mail two months in advance. If you don't receive a letter, please call our office to schedule the follow-up appointment.

## 2010-08-16 NOTE — Letter (Signed)
Summary: [facility]   Imported By: Marilynne Drivers 01/08/2010 08:20:57  _____________________________________________________________________  External Attachment:    Type:   Image     Comment:   External Document

## 2010-08-16 NOTE — Progress Notes (Signed)
  Phone Note Outgoing Call   Call placed by: Barnett Abu, RN, BSN,  September 11, 2009 3:58 PM Call placed to: Patient Summary of Call: Called patient and left message on machine To let the pt know that if there is any question if she still needs cardioversion, the answer is no. She was back in rythm at her nurse visit when she had her EKG. She will call me back if she has any further questions.  Initial call taken by: Barnett Abu, RN, BSN,  September 11, 2009 3:59 PM

## 2010-08-16 NOTE — Assessment & Plan Note (Signed)
Summary: EKG-please let Rhonda know when pt arrives  Nurse Visit   Impression & Recommendations: Pt. here for EKG. EKG done and reviewed by Dr.Klein. Per Dr. Caryl Comes pt OK to go off Coumadin as previously instructed for upcoming surgery. Pt. informed of information from Dr. Caryl Comes.   Allergies: 1)  ! Pcn 2)  ! Codeine 3)  ! * Statins

## 2010-08-16 NOTE — Medication Information (Signed)
Summary: rov/tm  Anticoagulant Therapy  Managed by: Freddrick March, RN, BSN Referring MD: Virl Axe MD PCP: Dr. [name] Supervising MD: Harrington Challenger MD, Nevin Bloodgood Indication 1: Atrial Fibrillation (ICD-427.31) Indication 2: DCCV Pending (08/08/2009) see weekly Lab Used: [facility] Site: [facility] INR POC 4.0 INR RANGE 2 - 3  Dietary changes: no    Health status changes: no    Bleeding/hemorrhagic complications: no    Recent/future hospitalizations: yes       Details: Right arm cyst will be surgically removed on 05/14/10. Awaiting surgeons reply to let pt know when to stop COumadin -either 4 or 5 days prior to sugery.  Any changes in medication regimen? yes       Details: 10 day course of Doxycycline for cyst infection under right arm - last dose take today.    Recent/future dental: no  Any missed doses?: no       Is patient compliant with meds? yes       Allergies: 1)  ! Pcn 2)  ! Codeine 3)  ! * Statins  Anticoagulation Management History:      The patient is taking warfarin and comes in today for a routine follow up visit.  Positive risk factors for bleeding include an age of 38 years or older.  Negative risk factors for bleeding include no history of CVA/TIA.  The bleeding index is 'intermediate risk'.  Positive CHADS2 values include History of HTN and Age > 88 years old.  Negative CHADS2 values include History of Diabetes and Prior Stroke/CVA/TIA.  The start date was 12/24/2002.  Her last INR was 5.2 RATIO.  Anticoagulation responsible Candise Crabtree: Harrington Challenger MD, Nevin Bloodgood.  INR POC: 4.0.  Cuvette Lot#: [id number].  Exp: 05/2011.    Anticoagulation Management Assessment/Plan:      The patient's current anticoagulation dose is Warfarin sodium 2.5 mg tabs: Use as directed by Anticoagualtion Clinic.  The target INR is 2 - 3.  The next INR is due 05/21/2010.  Anticoagulation instructions were given to patient.  Results were reviewed/authorized by Freddrick March, RN, BSN.  She was notified by  Ralene Bathe, PharmD Candidate.         Prior Anticoagulation Instructions: INR 2.9  Continue taking Coumadin 5 mg (2 tabs) on Sun, Mon, Wed, Fri, Sat and Coumadin 2.5 mg (1 tab) on Tues, Thur.  Return to clinic in 4 weeks.   Current Anticoagulation Instructions: INR 4.0  Skip tomorrow's dose, and take 1 tablet on Sunday.  Then resume normal Coumadin schedule of 2 tablets daily except 1 tablet on Tuesday and Thursday.    We will call you to inform you if the surgeon wants you to stop your Coumadin on Wednesday 10/26 or Thursday 10/27.    Resume normal Coumadin schedule after surgery, and return to clinic one week later, on November 7th, 2011.

## 2010-08-16 NOTE — Medication Information (Signed)
Summary: rov/tp  Anticoagulant Therapy  Managed by: Vanessa [place], PharmD Referring MD: Virl Axe MD PCP: Dr. [name] Supervising MD: Harrington Challenger MD, Nevin Bloodgood Indication 1: Atrial Fibrillation (ICD-427.31) Indication 2: DCCV Pending (08/08/2009) see weekly Lab Used: [facility] Site: [facility] INR POC 2.7 INR RANGE 2 - 3  Dietary changes: no    Health status changes: no    Bleeding/hemorrhagic complications: no    Recent/future hospitalizations: no    Any changes in medication regimen? no    Recent/future dental: no  Any missed doses?: no       Is patient compliant with meds? yes       Allergies: 1)  ! Pcn 2)  ! Codeine 3)  ! * Statins  Anticoagulation Management History:      The patient is taking warfarin and comes in today for a routine follow up visit.  Positive risk factors for bleeding include an age of 75 years or older.  Negative risk factors for bleeding include no history of CVA/TIA.  The bleeding index is 'intermediate risk'.  Positive CHADS2 values include History of HTN and Age > 5 years old.  Negative CHADS2 values include History of Diabetes and Prior Stroke/CVA/TIA.  The start date was 12/24/2002.  Her last INR was 5.2 RATIO.  Anticoagulation responsible provider: Harrington Challenger MD, Nevin Bloodgood.  INR POC: 2.7.  Cuvette Lot#: [id number].  Exp: 07/2011.    Anticoagulation Management Assessment/Plan:      The patient's current anticoagulation dose is Warfarin sodium 2.5 mg tabs: Use as directed by Anticoagualtion Clinic.  The target INR is 2 - 3.  The next INR is due 09/03/2010.  Anticoagulation instructions were given to patient.  Results were reviewed/authorized by Vanessa Stearns, PharmD.         Prior Anticoagulation Instructions: Cont with current regimen Return to clinic on Jan 23rd, at 9 am  Current Anticoagulation Instructions: INR:  2.7 (goal 2-3)  Your INR is at goal today.  Continue 5 mg everyday EXCEPT 2.5 mg on Tuesdays and Thursdays. Return in 4 weeks for  another INR check.  Give [place] a call when you know more about your shoulder surgery.

## 2010-08-16 NOTE — Letter (Signed)
Summary: Handout Printed  Printed Handout:  - Coumadin Instructions-w/out Meds 

## 2010-08-16 NOTE — Medication Information (Signed)
Summary: rov/cb  Anticoagulant Therapy  Managed by: Margaretha Sheffield, PharmD Referring MD: Virl Axe MD PCP: Dr. [name] Supervising MD: Ron Parker MD, Dellis Filbert Indication 1: Atrial Fibrillation (ICD-427.31) Indication 2: DCCV Pending (08/08/2009) see weekly Lab Used: [place] Site: [facility] INR POC 2.0 INR RANGE 2 - 3  Dietary changes: no    Health status changes: no    Bleeding/hemorrhagic complications: no    Recent/future hospitalizations: no    Any changes in medication regimen? no    Recent/future dental: no  Any missed doses?: no       Is patient compliant with meds? yes       Allergies: 1)  ! Pcn 2)  ! Codeine 3)  ! * Statins  Anticoagulation Management History:      The patient is taking warfarin and comes in today for a routine follow up visit.  Positive risk factors for bleeding include an age of 47 years or older.  Negative risk factors for bleeding include no history of CVA/TIA.  The bleeding index is 'intermediate risk'.  Positive CHADS2 values include History of HTN and Age > 49 years old.  Negative CHADS2 values include History of Diabetes and Prior Stroke/CVA/TIA.  The start date was 12/24/2002.  Her last INR was 5.2 RATIO.  Anticoagulation responsible provider: Ron Parker MD, Dellis Filbert.  INR POC: 2.0.  Cuvette Lot#: [id number].  Exp: 12/2010.    Anticoagulation Management Assessment/Plan:      The patient's current anticoagulation dose is Warfarin sodium 2.5 mg tabs: Use as directed by Anticoagualtion Clinic.  The target INR is 2 - 3.  The next INR is due 11/27/2009.  Anticoagulation instructions were given to patient.  Results were reviewed/authorized by Margaretha Sheffield, PharmD.  She was notified by Margaretha Sheffield.         Prior Anticoagulation Instructions: INR 2.2. Take 2 tablets daily except 1 tablet on Tues, Thurs, Sun.  Recheck in 2-3 weeks.  Current Anticoagulation Instructions: INR 2.0  Start NEW dosing schedule of 2 tablets every day, except take 1  tablet on Tuesday and Thursday.  Return to clinic in 2 weeks.

## 2010-08-16 NOTE — Miscellaneous (Signed)
Summary: change in GI care  Patient changed GI care to Dr. Penelope Coop we will note it in [place].

## 2010-08-16 NOTE — Medication Information (Signed)
Summary: rov/tm  Anticoagulant Therapy  Managed by: Gypsy Lore, PharmD Referring MD: Virl Axe MD PCP: Dr. [name] Supervising MD: Harrington Challenger MD, Nevin Bloodgood Indication 1: Atrial Fibrillation (ICD-427.31) Indication 2: DCCV Pending (08/08/2009) see weekly Lab Used: [facility] Site: [facility] INR POC 2.9 INR RANGE 2 - 3  Dietary changes: no    Health status changes: no    Bleeding/hemorrhagic complications: no    Recent/future hospitalizations: no    Any changes in medication regimen? no    Recent/future dental: no  Any missed doses?: no       Is patient compliant with meds? yes       Current Medications (verified): 1)  Acebutolol Hcl 200 Mg Caps (Acebutolol Hcl) .Marland Kitchen.. 1 Tab By Mouth Two Times A Day. 2)  Diovan Hct 320-12.5 Mg Tabs (Valsartan-Hydrochlorothiazide) .... Take One Tablet Once Daily 3)  Propranolol Hcl 10 Mg Tabs (Propranolol Hcl) .... Take 1 Tablet As Needed 4)  Calcium 600/vitamin D 600-400 Mg-Unit Chew (Calcium Carbonate-Vitamin D) .... Take 1 Tablet Two Times A Day 5)  Multivitamins  Tabs (Multiple Vitamin) .... Take One Tablet Once Daily 6)  D 1000 Plus  Tabs (Fa-Cyanocobalamin-B6-D-Ca) .Marland Kitchen.. 1 Tab Daily 7)  Warfarin Sodium 2.5 Mg Tabs (Warfarin Sodium) .... Use As Directed By Anticoagualtion Clinic 8)  Tramadol Hcl 50 Mg Tabs (Tramadol Hcl) .... 1/2 To 1 Tablet Every 6 Hrs As Needed  Allergies (verified): 1)  ! Pcn 2)  ! Codeine 3)  ! * Statins  Anticoagulation Management History:      The patient is taking warfarin and comes in today for a routine follow up visit.  Positive risk factors for bleeding include an age of 75 years or older.  Negative risk factors for bleeding include no history of CVA/TIA.  The bleeding index is 'intermediate risk'.  Positive CHADS2 values include History of HTN and Age > 29 years old.  Negative CHADS2 values include History of Diabetes and Prior Stroke/CVA/TIA.  The start date was 12/24/2002.  Her last INR was 5.2 RATIO.   Anticoagulation responsible [name] [name]: Harrington Challenger MD, Nevin Bloodgood.  INR POC: 2.9.  Cuvette Lot#: [id number].  Exp: 05/2011.    Anticoagulation Management Assessment/Plan:      The patient's current anticoagulation dose is Warfarin sodium 2.5 mg tabs: Use as directed by Anticoagualtion Clinic.  The target INR is 2 - 3.  The next INR is due 05/21/2010.  Anticoagulation instructions were given to patient.  Results were reviewed/authorized by Gypsy Lore, PharmD.  She was notified by Gypsy Lore, PharmD.         Prior Anticoagulation Instructions: INR 3.0 Today only take 1 pill then resume 2 pills everyday except 1 pill on Tuesdays and Thursdays. Recheck in 3 weeks.   Current Anticoagulation Instructions: INR 2.9  Continue taking Coumadin 5 mg (2 tabs) on Sun, Mon, Wed, Fri, Sat and Coumadin 2.5 mg (1 tab) on Tues, Thur.  Return to clinic in 4 weeks.

## 2010-08-16 NOTE — Medication Information (Signed)
Summary: rov/tm  Anticoagulant Therapy  Managed by: Bonnita Nasuti, PharmD, BCPS, CPP Referring MD: Virl Axe MD PCP: Dr. [name] Supervising MD: Haroldine Laws MD, Quillian Quince Indication 1: Atrial Fibrillation (ICD-427.31) Indication 2: DCCV Pending (08/08/2009) see weekly Lab Used: [facility] [place] Site: [facility] INR POC 2.6 INR RANGE 2 - 3  Dietary changes: no    Health status changes: no    Bleeding/hemorrhagic complications: no    Recent/future hospitalizations: no    Any changes in medication regimen? yes       Details: bactrium day 5/5  Recent/future dental: no  Any missed doses?: yes     Details: below   Comments: s/p abcess removal - off couamdin  - restarted wed 11/16   Current Medications (verified): 1)  Acebutolol Hcl 200 Mg Caps (Acebutolol Hcl) .Marland Kitchen.. 1 Tab By Mouth Two Times A Day. 2)  Diovan Hct 320-12.5 Mg Tabs (Valsartan-Hydrochlorothiazide) .... Take One Tablet Once Daily 3)  Propranolol Hcl 10 Mg Tabs (Propranolol Hcl) .... Take 1 Tablet As Needed 4)  Calcium 600/vitamin D 600-400 Mg-Unit Chew (Calcium Carbonate-Vitamin D) .... Take 1 Tablet Two Times A Day 5)  Multivitamins  Tabs (Multiple Vitamin) .... Take One Tablet Once Daily 6)  D 1000 Plus  Tabs (Fa-Cyanocobalamin-B6-D-Ca) .Marland Kitchen.. 1 Tab Daily 7)  Warfarin Sodium 2.5 Mg Tabs (Warfarin Sodium) .... Use As Directed By Anticoagualtion Clinic  Allergies: 1)  ! Pcn 2)  ! Codeine 3)  ! * Statins  Anticoagulation Management History:      Positive risk factors for bleeding include an age of 90 years or older.  Negative risk factors for bleeding include no history of CVA/TIA.  The bleeding index is 'intermediate risk'.  Positive CHADS2 values include History of HTN and Age > 13 years old.  Negative CHADS2 values include History of Diabetes and Prior Stroke/CVA/TIA.  The start date was 12/24/2002.  Her last INR was 5.2 RATIO.  Anticoagulation responsible provider: Afrika Brick MD, Quillian Quince.  INR POC: 2.6.  Exp: 05/2011.     Anticoagulation Management Assessment/Plan:      The patient's current anticoagulation dose is Warfarin sodium 2.5 mg tabs: Use as directed by Anticoagualtion Clinic.  The target INR is 2 - 3.  The next INR is due 06/18/2010.  Anticoagulation instructions were given to patient.  Results were reviewed/authorized by Bonnita Nasuti, PharmD, BCPS, CPP.         Prior Anticoagulation Instructions: INR 1.9 Today take 7.458ms then continue with 5758m everyday except 2.58m34mon Tuesdays and Thursdays.  Per MD insturctions take last dose on 05/27/10. Will call pt after Dr KleAquilla Hackerrse discuss this with him today.   Resume post procedure taking 2 tablets daily.   Current Anticoagulation Instructions: INR 2.6  Coumadin 2.58mg73mbs Take 1 tab today WED 11/23 then 1 tab Tue, Thur and 2 tabs all other days

## 2010-08-16 NOTE — Medication Information (Signed)
Summary: rov/tm  Anticoagulant Therapy  Managed by: Tula Nakayama, RN, BSN Referring MD: Virl Axe MD PCP: [name] Supervising MD: Stanford Breed MD, Aaron Edelman Indication 1: Atrial Fibrillation (ICD-427.31) Lab Used: [facility] Site: [facility] INR POC 2.5 INR RANGE 2 - 3  Dietary changes: no    Health status changes: no    Bleeding/hemorrhagic complications: no    Recent/future hospitalizations: no    Any changes in medication regimen? no    Recent/future dental: no  Any missed doses?: no       Is patient compliant with meds? yes      Comments: Pending Colonoscopy on 08/02/09 with Dr. Penelope Coop  Allergies: 1)  ! Pcn 2)  ! Codeine 3)  ! * Statins  Anticoagulation Management History:      The patient is taking warfarin and comes in today for a routine follow up visit.  Positive risk factors for bleeding include an age of 75 years or older.  Negative risk factors for bleeding include no history of CVA/TIA.  The bleeding index is 'intermediate risk'.  Positive CHADS2 values include History of HTN and Age > 57 years old.  Negative CHADS2 values include History of Diabetes and Prior Stroke/CVA/TIA.  The start date was 12/24/2002.  Her last INR was 5.2 RATIO.  Anticoagulation responsible provider: Stanford Breed MD, Aaron Edelman.  INR POC: 2.5.  Cuvette Lot#: [id number].  Exp: 08/2010.    Anticoagulation Management Assessment/Plan:      The patient's current anticoagulation dose is Warfarin sodium 2.5 mg tabs: Use as directed by Anticoagualtion Clinic.  The target INR is 2 - 3.  The next INR is due 08/09/2009.  Anticoagulation instructions were given to patient.  Results were reviewed/authorized by Tula Nakayama, RN, BSN.  She was notified by Tula Nakayama, RN, BSN.         Prior Anticoagulation Instructions: INR 2.7 Continue 2.7ms daily except 532m Mondays, Wednesdays, and Fridays. Recheck in 4 weeks.   Current Anticoagulation Instructions: INR 2.5 Continue same dose. If cleared last dose will be  07/27/09 for Colonoscopy. Follow up on 08/09/09 post procedure.

## 2010-08-16 NOTE — Medication Information (Signed)
Summary: rov/eac  Anticoagulant Therapy  Managed by: Margaretha Sheffield, PharmD Referring MD: Virl Axe MD PCP: Dr. [name] Supervising MD: Stanford Breed MD, Aaron Edelman Indication 1: Atrial Fibrillation (ICD-427.31) Indication 2: DCCV Pending (08/08/2009) see weekly Lab Used: [facility] Site: [facility] INR POC 2.1 INR RANGE 2 - 3  Dietary changes: no    Health status changes: no    Bleeding/hemorrhagic complications: no    Recent/future hospitalizations: no    Any changes in medication regimen? no    Recent/future dental: no  Any missed doses?: no       Is patient compliant with meds? yes      Comments: Pending cardioversion; however, pt is unsure if she is actually having a cardioversion.  Pt says she is back in rhythm and can feel when she goes in and out of rhythm.  I have flagged Dr. Caryl Comes to determine cardioversion status of this patient.  Allergies: 1)  ! Pcn 2)  ! Codeine 3)  ! * Statins  Anticoagulation Management History:      The patient is taking warfarin and comes in today for a routine follow up visit.  Positive risk factors for bleeding include an age of 54 years or older.  Negative risk factors for bleeding include no history of CVA/TIA.  The bleeding index is 'intermediate risk'.  Positive CHADS2 values include History of HTN and Age > 92 years old.  Negative CHADS2 values include History of Diabetes and Prior Stroke/CVA/TIA.  The start date was 12/24/2002.  Her last INR was 5.2 RATIO.  Anticoagulation responsible provider: Stanford Breed MD, Aaron Edelman.  INR POC: 2.1.  Cuvette Lot#: [id number].  Exp: 10/2010.    Anticoagulation Management Assessment/Plan:      The patient's current anticoagulation dose is Warfarin sodium 2.5 mg tabs: Use as directed by Anticoagualtion Clinic.  The target INR is 2 - 3.  The next INR is due 08/28/2009.  Anticoagulation instructions were given to patient.  Results were reviewed/authorized by Margaretha Sheffield, PharmD.  She was notified by Margaretha Sheffield.         Prior Anticoagulation Instructions: INR 2.2  Pending cardioversion. Start new dosing schedule of 2 tablets daily, except 1 tablet on Thursday and Saturday. Return to clinic 1 week.  Current Anticoagulation Instructions: INR 2.1  Take 2.5 tablets today.  Then start NEW dosing schedule of 2 tablets every day, except take 1 tablet on Saturday.   I will follow-up with Dr. Blake Divine and call you with their recommendation as well as to schedule a follow-up visit.

## 2010-08-16 NOTE — Assessment & Plan Note (Addendum)
Summary: 6 month rov.sl   Primary Provider:  Dr. [name]   History of Present Illness: [name] [name] is seen in followup for paroxysmal atrial fibrillation.  since her 14 day episode that occurred around the timing of her colonoscopylast winter she has had very few episodes none lasting more than an hour or so. Overall she feels great  there's been no chest pain shortness of breath or peripheral edema. She does have her blood pressure is elevated today at her primary care physician's office     Current Medications (verified): 1)  Acebutolol Hcl 200 Mg Caps (Acebutolol Hcl) .Marland Kitchen.. 1 Tab By Mouth Two Times A Day. 2)  Diovan Hct 320-12.5 Mg Tabs (Valsartan-Hydrochlorothiazide) .... Take One Tablet Once Daily 3)  Propranolol Hcl 10 Mg Tabs (Propranolol Hcl) .... Take 1 Tablet As Needed 4)  Calcium 600/vitamin D 600-400 Mg-Unit Chew (Calcium Carbonate-Vitamin D) .... Take 1 Tablet Two Times A Day 5)  Multivitamins  Tabs (Multiple Vitamin) .... Take One Tablet Once Daily 6)  D 1000 Plus  Tabs (Fa-Cyanocobalamin-B6-D-Ca) .Marland Kitchen.. 1 Tab Daily 7)  Warfarin Sodium 2.5 Mg Tabs (Warfarin Sodium) .... Use As Directed By Anticoagualtion Clinic  Allergies (verified): 1)  ! Pcn 2)  ! Codeine 3)  ! * Statins  Past History:  Past Medical History: Last updated: 04/28/2008 1. Paroxysmal atrial fibrillation 2. Bradycardia. 3. Hypertension. 4. Nonobstructive CAD by [facility] 12/05/06 5. NASH 6. PMR, chronically treated with steroids 7. Hyperlipidemia  Past Surgical History: Last updated: 04/28/2008 1.  Umbilical hernia repair 4196Q 2.  Breast abscess removed 1973  Family History: Last updated: 04/28/2008 Family History of Coronary Artery Disease:  Father has Alzheimers Disease  Social History: Last updated: 04/28/2008 Retired Geophysical data processor.   She denies tobacco, ETOH, or drugs.  Vital Signs:  Patient profile:   77 year old female Height:      65 inches Weight:      184  pounds BMI:     30.73 Pulse rate:   49 / minute Resp:     16 per minute BP sitting:   122 / 70  (right arm)  Vitals Entered By: Levora Angel, CNA (April 24, 2010 2:26 PM)  Physical Exam  General:  The patient was alert and oriented in no acute distress. HEENT Normal.  Neck veins were flat, carotids were brisk.  Lungs were clear.  Heart sounds were regular without murmurs; S4 present Abdomen was soft with active bowel sounds. There is no clubbing cyanosis or edema. Skin Warm and dry    EKG  Procedure date:  04/24/2010  Findings:      sinus rhythm at 49 Intervals 0.15/2007/0.47 Axis 24 nonspecificST-T changes  Impression & Recommendations:  Problem # 1:  PAROXYSMAL ATRIAL FIBRILLATION (ICD-427.31) the patient has had a few palpitations of either atrial fibrillation or PVCs and we will continue her on her current medication Her updated medication list for this problem includes:    Acebutolol Hcl 200 Mg Caps (Acebutolol hcl) .Marland Kitchen... 1 tab by mouth two times a day.    Propranolol Hcl 10 Mg Tabs (Propranolol hcl) .Marland Kitchen... Take 1 tablet as needed    Warfarin Sodium 2.5 Mg Tabs (Warfarin sodium) ..... Use as directed by anticoagualtion clinic  Problem # 2:  PREMATURE VENTRICULAR CONTRACTIONS (ICD-427.69) as above Her updated medication list for this problem includes:    Acebutolol Hcl 200 Mg Caps (Acebutolol hcl) .Marland Kitchen... 1 tab by mouth two times a day.    Propranolol Hcl 10  Mg Tabs (Propranolol hcl) .Marland Kitchen... Take 1 tablet as needed    Warfarin Sodium 2.5 Mg Tabs (Warfarin sodium) ..... Use as directed by anticoagualtion clinic  Problem # 3:  SICK SINUS/ TACHY-BRADY SYNDROME (ICD-427.81) her heart rate is adequate for her tasks. It does limit up titration over a subdural as a blood pressure option Her updated medication list for this problem includes:    Acebutolol Hcl 200 Mg Caps (Acebutolol hcl) .Marland Kitchen... 1 tab by mouth two times a day.    Propranolol Hcl 10 Mg Tabs (Propranolol hcl)  .Marland Kitchen... Take 1 tablet as needed    Warfarin Sodium 2.5 Mg Tabs (Warfarin sodium) ..... Use as directed by anticoagualtion clinic  Problem # 4:  HYPERTENSION, BENIGN (EYE-233.6) systolic blood pressure  was 170 at the primary care doctor's office today here at 1 20; she will record the numbers at home and follow up with her PCP Her updated medication list for this problem includes:    Acebutolol Hcl 200 Mg Caps (Acebutolol hcl) .Marland Kitchen... 1 tab by mouth two times a day.    Diovan Hct 320-12.5 Mg Tabs (Valsartan-hydrochlorothiazide) .Marland Kitchen... Take one tablet once daily    Propranolol Hcl 10 Mg Tabs (Propranolol hcl) .Marland Kitchen... Take 1 tablet as needed  Patient Instructions: 1)  Your physician recommends that you continue on your current medications as directed. Please refer to the Current Medication list given to you today. 2)  Your physician wants you to follow-up in: 6 months  You will receive a reminder letter in the mail two months in advance. If you don't receive a letter, please call our office to schedule the follow-up appointment.

## 2010-08-16 NOTE — Medication Information (Signed)
Summary: rov/tm  Anticoagulant Therapy  Managed by: Tula Nakayama, RN, BSN Referring MD: Virl Axe MD PCP: Dr. [name] Supervising MD: Harrington Challenger MD, Nevin Bloodgood Indication 1: Atrial Fibrillation (ICD-427.31) Indication 2: DCCV Pending (08/08/2009) see weekly Lab Used: [facility] Site: [facility] INR POC 3.0 INR RANGE 2 - 3  Dietary changes: no    Health status changes: no    Bleeding/hemorrhagic complications: no    Recent/future hospitalizations: no    Any changes in medication regimen? no    Recent/future dental: no  Any missed doses?: no       Is patient compliant with meds? yes      Comments: Pending Left eye cataract removal on 04/16/10.   Allergies: 1)  ! Pcn 2)  ! Codeine 3)  ! * Statins  Anticoagulation Management History:      The patient is taking warfarin and comes in today for a routine follow up visit.  Positive risk factors for bleeding include an age of 75 years or older.  Negative risk factors for bleeding include no history of CVA/TIA.  The bleeding index is 'intermediate risk'.  Positive CHADS2 values include History of HTN and Age > 3 years old.  Negative CHADS2 values include History of Diabetes and Prior Stroke/CVA/TIA.  The start date was 12/24/2002.  Her last INR was 5.2 RATIO.  Anticoagulation responsible Ottie Neglia: Harrington Challenger MD, Nevin Bloodgood.  INR POC: 3.0.  Cuvette Lot#: [id number].  Exp: 05/2011.    Anticoagulation Management Assessment/Plan:      The patient's current anticoagulation dose is Warfarin sodium 2.5 mg tabs: Use as directed by Anticoagualtion Clinic.  The target INR is 2 - 3.  The next INR is due 04/23/2010.  Anticoagulation instructions were given to patient.  Results were reviewed/authorized by Tula Nakayama, RN, BSN.  She was notified by Tula Nakayama, RN, BSN.         Prior Anticoagulation Instructions: INR 3.1 Today take only take 2.528ms then resume 512m everyday except 2.28m36mon Tuesdays and Thursdays. Recheck in 3 weeks. Incorporate another  serving of leafy green veggie per week.   Current Anticoagulation Instructions: INR 3.0 Today only take 1 pill then resume 2 pills everyday except 1 pill on Tuesdays and Thursdays. Recheck in 3 weeks.

## 2010-08-16 NOTE — Medication Information (Signed)
Summary: rov/tm  Anticoagulant Therapy  Managed by: Margaretha Sheffield, PharmD Referring MD: Virl Axe MD PCP: Dr. [name] Supervising MD: Harrington Challenger MD, Nevin Bloodgood Indication 1: Atrial Fibrillation (ICD-427.31) Indication 2: DCCV Pending (08/08/2009) see weekly Lab Used: [facility] Site: [facility] INR POC 2.2 INR RANGE 2 - 3  Dietary changes: no    Health status changes: no    Bleeding/hemorrhagic complications: no    Recent/future hospitalizations: no    Any changes in medication regimen? no    Recent/future dental: no  Any missed doses?: no       Is patient compliant with meds? yes      Comments: Pt back in SR, no DCCV planned.  Allergies: 1)  ! Pcn 2)  ! Codeine 3)  ! * Statins  Anticoagulation Management History:      The patient is taking warfarin and comes in today for a routine follow up visit.  Positive risk factors for bleeding include an age of 75 years or older.  Negative risk factors for bleeding include no history of CVA/TIA.  The bleeding index is 'intermediate risk'.  Positive CHADS2 values include History of HTN and Age > 17 years old.  Negative CHADS2 values include History of Diabetes and Prior Stroke/CVA/TIA.  The start date was 12/24/2002.  Her last INR was 5.2 RATIO.  Anticoagulation responsible provider: Harrington Challenger MD, Nevin Bloodgood.  INR POC: 2.2.  Cuvette Lot#: [id number].  Exp: 10/2010.    Anticoagulation Management Assessment/Plan:      The patient's current anticoagulation dose is Warfarin sodium 2.5 mg tabs: Use as directed by Anticoagualtion Clinic.  The target INR is 2 - 3.  The next INR is due 10/02/2009.  Anticoagulation instructions were given to patient.  Results were reviewed/authorized by Margaretha Sheffield, PharmD.  She was notified by Margaretha Sheffield.         Prior Anticoagulation Instructions: INR 4.1 Skip today's dose then change dose to 1 tablet everyday excpet 2 tablets on Mondays, Wednesdays and Fridays. Recheck in 10 days.   Current Anticoagulation  Instructions: INR 2.2  Continue current dosing schedule of 2 tablets on Monday, Wednesday, and Friday and 1 tablet all other days.  Return to clinic in 2 weeks.

## 2010-08-16 NOTE — Progress Notes (Signed)
Summary: stop coumadin  Phone Note From Other Clinic   Caller: nurse  shannon Summary of Call: pt needs to stop coumadin 3-5 days prior to colonoscopy on the 19th. Ofc [id number] fax [phone]. nurse states she sent a form to the doctor to fill out. Initial call taken by: Lorraine Lax,  July 26, 2009 11:01 AM  Follow-up for Phone Call        Form completed and faxed to Dr. Penelope Coop. Per Dr. Caryl Comes OK to stop as needed, resume ASAP. Follow-up by: Barnett Abu, RN, BSN,  July 26, 2009 5:51 PM

## 2010-08-16 NOTE — Medication Information (Signed)
Summary: Coumadin Clinic  Anticoagulant Therapy  Managed by: Freddrick March, RN, BSN Referring MD: Virl Axe MD PCP: Dr. [name] Supervising MD: Percival Spanish MD, Jeneen Rinks Indication 1: Atrial Fibrillation (ICD-427.31) Indication 2: DCCV Pending (08/08/2009) see weekly Lab Used: [facility] Site: [facility] INR POC 4.7 INR RANGE 2 - 3  Dietary changes: no    Health status changes: yes       Details: Pt has had the stomach flu x 1 week.  Diarrhea x 1 week.    Bleeding/hemorrhagic complications: no    Recent/future hospitalizations: no    Any changes in medication regimen? yes       Details: Started on Flagyl 3 days ago, has 4 more days.    Recent/future dental: no  Any missed doses?: no       Is patient compliant with meds? yes       Allergies: 1)  ! Pcn 2)  ! Codeine 3)  ! * Statins  Anticoagulation Management History:      The patient is taking warfarin and comes in today for a routine follow up visit.  Positive risk factors for bleeding include an age of 75 years or older.  Negative risk factors for bleeding include no history of CVA/TIA.  The bleeding index is 'intermediate risk'.  Positive CHADS2 values include History of HTN and Age > 75 years old.  Negative CHADS2 values include History of Diabetes and Prior Stroke/CVA/TIA.  The start date was 12/24/2002.  Her last INR was 5.2 RATIO.  Anticoagulation responsible provider: Percival Spanish MD, Jeneen Rinks.  INR POC: 4.7.  Cuvette Lot#: [id number].  Exp: 11/2010.    Anticoagulation Management Assessment/Plan:      The patient's current anticoagulation dose is Warfarin sodium 2.5 mg tabs: Use as directed by Anticoagualtion Clinic.  The target INR is 2 - 3.  The next INR is due 09/27/2009.  Anticoagulation instructions were given to patient.  Results were reviewed/authorized by Freddrick March, RN, BSN.  She was notified by Freddrick March RN.         Prior Anticoagulation Instructions: INR 2.2  Continue current dosing schedule of 2 tablets  on Monday, Wednesday, and Friday and 1 tablet all other days.  Return to clinic in 2 weeks.   Current Anticoagulation Instructions: INR 4.7  Skip Sat and Sun dosages of coumadin, take 1 tablet on Monday and Tuesday, then recheck on Wednesday.

## 2010-08-16 NOTE — Medication Information (Signed)
Summary: rov/sp  Anticoagulant Therapy  Managed by: Porfirio Oar, PharmD Referring MD: Virl Axe MD PCP: Dr. [name] Supervising MD: Harrington Challenger MD, Nevin Bloodgood Indication 1: Atrial Fibrillation (ICD-427.31) Indication 2: DCCV Pending (08/08/2009) see weekly Lab Used: [facility] Site: [facility] INR POC 3.3 INR RANGE 2 - 3  Dietary changes: no    Health status changes: no    Bleeding/hemorrhagic complications: no    Recent/future hospitalizations: no    Any changes in medication regimen? no    Recent/future dental: no  Any missed doses?: no       Is patient compliant with meds? yes       Allergies: 1)  ! Pcn 2)  ! Codeine 3)  ! * Statins  Anticoagulation Management History:      The patient is taking warfarin and comes in today for a routine follow up visit.  Positive risk factors for bleeding include an age of 7 years or older.  Negative risk factors for bleeding include no history of CVA/TIA.  The bleeding index is 'intermediate risk'.  Positive CHADS2 values include History of HTN and Age > 50 years old.  Negative CHADS2 values include History of Diabetes and Prior Stroke/CVA/TIA.  The start date was 12/24/2002.  Her last INR was 5.2 RATIO.  Anticoagulation responsible provider: Harrington Challenger MD, Nevin Bloodgood.  INR POC: 3.3.  Cuvette Lot#: [id number].  Exp: 04/2011.    Anticoagulation Management Assessment/Plan:      The patient's current anticoagulation dose is Warfarin sodium 2.5 mg tabs: Use as directed by Anticoagualtion Clinic.  The target INR is 2 - 3.  The next INR is due 03/12/2010.  Anticoagulation instructions were given to patient.  Results were reviewed/authorized by Porfirio Oar, PharmD.  She was notified by Vassie Loll, PharmD Candidate.         Prior Anticoagulation Instructions: INR 2.6  Continue same dose of 2 tablets every day except 1 tablet on Tuesday and Thursday.  Eat some extra green vegetables over weekend.   Current Anticoagulation Instructions: INR 3.3  Skip  today's dose.  Then resume taking regular regimen of 2 tablets (35m) every day except take 1 tablet (2.574m on Tues and Thurs.

## 2010-08-16 NOTE — Progress Notes (Signed)
Summary: speak to Coumadin Clinic  Phone Note Call from Patient Call back at Home Phone [phone] Call back at [phone]   Caller: Patient Reason for Call: Talk to Nurse Summary of Call: request to speak to [place] Initial call taken by: Darnell Level,  January 08, 2010 8:59 AM  Follow-up for Phone Call        Pt missed Saturday's dose and wanted to know what she should do.  Instructed her to take 2 tablets on Tuesday (rather than 1 tablet) and then continue same dose.  Will keep her original appt on 7/11.  Follow-up by: Porfirio Oar PharmD,  January 08, 2010 2:37 PM

## 2010-08-16 NOTE — Medication Information (Signed)
Summary: Coumadin Clinic  Anticoagulant Therapy  Managed by: Alinda Deem, PharmD Referring MD: Virl Axe MD PCP: Dr. [name] Supervising MD: Verl Blalock MD, Marcello Moores Indication 1: Atrial Fibrillation (ICD-427.31) Indication 2: DCCV Pending (08/08/2009) see weekly Lab Used: [place] Site: [facility] INR RANGE 2 - 3           Allergies: 1)  ! Pcn 2)  ! Codeine 3)  ! * Statins  Anticoagulation Management History:      Positive risk factors for bleeding include an age of 52 years or older.  Negative risk factors for bleeding include no history of CVA/TIA.  The bleeding index is 'intermediate risk'.  Positive CHADS2 values include History of HTN and Age > 67 years old.  Negative CHADS2 values include History of Diabetes and Prior Stroke/CVA/TIA.  The start date was 12/24/2002.  Her last INR was 5.2 RATIO.  Anticoagulation responsible provider: Verl Blalock MD, Marcello Moores.  Exp: 08/2010.    Anticoagulation Management Assessment/Plan:      The patient's current anticoagulation dose is Warfarin sodium 2.5 mg tabs: Use as directed by Anticoagualtion Clinic.  The target INR is 2 - 3.  The next INR is due 08/14/2009.  Anticoagulation instructions were given to patient.  Results were reviewed/authorized by Alinda Deem, PharmD.         Prior Anticoagulation Instructions: INR 2.5  Stop Lovenox. Continue taking warfarin 2.72m daily except 533mon Mondays, Wednesdays, and Fridays. Recheck in 1 week.

## 2010-08-16 NOTE — Medication Information (Signed)
Summary: rov/tm  Anticoagulant Therapy  Managed by: Alinda Deem, PharmD, BCPS, CPP Referring MD: Virl Axe MD PCP: Dr. [name] Supervising MD: Verl Blalock MD, Marcello Moores Indication 1: Atrial Fibrillation (ICD-427.31) Lab Used: [facility] Site: [facility] INR POC 1.7 INR RANGE 2 - 3  Vital Signs: Pulse Rate: 96  Rhythm: irregular  Respirations: 18     Dietary changes: no    Health status changes: yes       Details: Patient had a colonoscopy done on last Wednesday and was off coumadin 5 days before the procedure  Bleeding/hemorrhagic complications: no    Recent/future hospitalizations: no    Any changes in medication regimen? no    Recent/future dental: no  Any missed doses?: yes     Details: resumed warfarin on 1/18 after colon  Is patient compliant with meds? yes      Comments: Patient resarted coumadin, 7.50m on Wednesday and Thursday along with lovenox 30 mg sq bid per GI (pt concerned after procedure that she was in AF.)  Current Medications (verified): 1)  Acebutolol Hcl 200 Mg Caps (Acebutolol Hcl) ..Marland Kitchen. 1 Tab By Mouth Two Times A Day. 2)  Diovan Hct 320-12.5 Mg Tabs (Valsartan-Hydrochlorothiazide) .... Take One Tablet Once Daily 3)  Propranolol Hcl 10 Mg Tabs (Propranolol Hcl) .... Take 1 Tablet As Needed 4)  Calcium 600/vitamin D 600-400 Mg-Unit Chew (Calcium Carbonate-Vitamin D) .... Take 1 Tablet Two Times A Day 5)  Multivitamins  Tabs (Multiple Vitamin) .... Take One Tablet Once Daily 6)  D 1000 Plus  Tabs (Fa-Cyanocobalamin-B6-D-Ca) ..Marland Kitchen. 1 Tab Daily 7)  Warfarin Sodium 2.5 Mg Tabs (Warfarin Sodium) .... Use As Directed By Anticoagualtion Clinic 8)  Tramadol Hcl 50 Mg Tabs (Tramadol Hcl) .... 1/2 To 1 Tablet Every 6 Hrs As Needed 9)  Claritin 10 Mg Tabs (Loratadine) .... Take 1 Tablet By Mouth Once A Day As Needed  Allergies (verified): 1)  ! Pcn 2)  ! Codeine 3)  ! * Statins  Anticoagulation Management History:      The patient is taking warfarin and comes  in today for a routine follow up visit.  Positive risk factors for bleeding include an age of 861years or older.  Negative risk factors for bleeding include no history of CVA/TIA.  The bleeding index is 'intermediate risk'.  Positive CHADS2 values include History of HTN and Age > 739years old.  Negative CHADS2 values include History of Diabetes and Prior Stroke/CVA/TIA.  The start date was 12/24/2002.  Her last INR was 5.2 RATIO.  Anticoagulation responsible provider: WVerl BlalockMD, TMarcello Moores  INR POC: 1.7.  Cuvette Lot#: [id number]  Exp: 08/2010.    Anticoagulation Management Assessment/Plan:      The patient's current anticoagulation dose is Warfarin sodium 2.5 mg tabs: Use as directed by Anticoagualtion Clinic.  The target INR is 2 - 3.  The next INR is due 08/07/2009.  Anticoagulation instructions were given to patient.  Results were reviewed/authorized by MAlinda Deem PharmD, BCPS, CPP.  She was notified by EMerlyn Albert Pharm D Candidate.         Prior Anticoagulation Instructions: INR 2.5 Continue same dose. If cleared last dose will be 07/27/09 for Colonoscopy. Follow up on 08/09/09 post procedure.   Current Anticoagulation Instructions: INR: 1.7  Take 3 tablets  (total of 7.561m for 2 days then resume to 2.70m570maily except 70mg62m Mondays, Wednesdays and Fridays.  Continue lovenox at increased dose of 80 mg subcutaneously two times a day until  next visit.    Recheck on Monday 08/07/2009.  Patient verbalizes understanding that if HR elevates greater than 110 consistently, she will go to ER.   Prescriptions: LOVENOX 80 MG/0.8ML SOLN (ENOXAPARIN SODIUM) Inject one syringe subcutaneously into abdomen two times a day.  #5 x 0   Entered by:   Barnett Abu, RN, BSN   Authorized by:   Nikki Dom, MD, [facility]   Signed by:   Alinda Deem PharmD, BCPS, CPP on 08/04/2009   Method used:   Electronically to        [id number] (retail)       [address]       [place],   [address]       Ph: [phone] or [phone]       Fax: [phone]   RxID:   [id number]

## 2010-08-16 NOTE — Medication Information (Signed)
Summary: rov/ln  Anticoagulant Therapy  Managed by: Porfirio Oar, PharmD Referring MD: Virl Axe MD PCP: Dr. [name] Supervising MD: Stanford Breed MD, Aaron Edelman Indication 1: Atrial Fibrillation (ICD-427.31) Indication 2: DCCV Pending (08/08/2009) see weekly Lab Used: [facility] Site: [facility] INR POC 2.6 INR RANGE 2 - 3  Dietary changes: no    Health status changes: no    Bleeding/hemorrhagic complications: no    Recent/future hospitalizations: no    Any changes in medication regimen? yes       Details: on day 5 of 10 of Avelox  Recent/future dental: no  Any missed doses?: no       Is patient compliant with meds? yes       Allergies: 1)  ! Pcn 2)  ! Codeine 3)  ! * Statins  Anticoagulation Management History:      The patient is taking warfarin and comes in today for a routine follow up visit.  Positive risk factors for bleeding include an age of 75 years or older.  Negative risk factors for bleeding include no history of CVA/TIA.  The bleeding index is 'intermediate risk'.  Positive CHADS2 values include History of HTN and Age > 75 years old.  Negative CHADS2 values include History of Diabetes and Prior Stroke/CVA/TIA.  The start date was 12/24/2002.  Her last INR was 5.2 RATIO.  Anticoagulation responsible provider: Stanford Breed MD, Aaron Edelman.  INR POC: 2.6.  Cuvette Lot#: [id number].  Exp: 04/2011.    Anticoagulation Management Assessment/Plan:      The patient's current anticoagulation dose is Warfarin sodium 2.5 mg tabs: Use as directed by Anticoagualtion Clinic.  The target INR is 2 - 3.  The next INR is due 02/19/2010.  Anticoagulation instructions were given to patient.  Results were reviewed/authorized by Porfirio Oar, PharmD.  She was notified by Porfirio Oar PharmD.         Prior Anticoagulation Instructions: INR 2.3  Continue same regimen of 1 tab on Tuesday and Thursday and 2 tabs all other days. Re-check INR in 4 weeks.  Current Anticoagulation Instructions: INR  2.6  Continue same dose of 2 tablets every day except 1 tablet on Tuesday and Thursday.  Eat some extra green vegetables over weekend.

## 2010-08-16 NOTE — Assessment & Plan Note (Signed)
Summary: EKG (427.31)/ MMR  Nurse Visit   Vital Signs:  Patient profile:   77 year old female Height:      65 inches Weight:      187 pounds Pulse rate:   52 / minute Pulse rhythm:   regular Resp:     14 per minute CC: here for f/u EKG Comments PT HERE FOR F/U EKG WHICH DEMONSTRATES SINUS BRADY WITH OCCASIONAL PVC'S   Allergies: 1)  ! Pcn 2)  ! Codeine 3)  ! * Statins

## 2010-08-16 NOTE — Progress Notes (Signed)
Summary: Off Coumadin for Procedure  Phone Note Call from Patient   Caller: Patient Summary of Call: Pt called to ask when she was suppose to stop her Coumadin for upcoming procedure on 10/31.  Note was sent to Dr. Caryl Comes last week but no response yet.  Discussed with Dr. Caryl Comes.  Pt has no history of stroke and only risk factors are age and HTN.  Okay to stop Coumadin without Lovenox bridge.  Pt is aware.  Will take last dose of Coumadin on 10/19.  Will restart after procedure with 2 tablets every day until appt on 11/7.   Initial call taken by: Porfirio Oar PharmD,  May 08, 2010 11:57 AM     Appended Document: Off Coumadin for Procedure left message for pt that per Dr. Caryl Comes ok to stop coumadin on 11/13 for procedure on 11/17. Joan Flores, RN, BSN

## 2010-08-16 NOTE — Medication Information (Signed)
Summary: rov/eac  Anticoagulant Therapy  Managed by: Tula Nakayama, RN, BSN Referring MD: Virl Axe MD PCP: Dr. [name] Supervising MD: Johnsie Cancel MD, Collier Salina Indication 1: Atrial Fibrillation (ICD-427.31) Indication 2: DCCV Pending (08/08/2009) see weekly Lab Used: [facility] Site: [facility] INR POC 4.1 INR RANGE 2 - 3  Dietary changes: no    Health status changes: no    Bleeding/hemorrhagic complications: no    Recent/future hospitalizations: no    Any changes in medication regimen? no    Recent/future dental: no  Any missed doses?: no       Is patient compliant with meds? yes       Allergies: 1)  ! Pcn 2)  ! Codeine 3)  ! * Statins  Anticoagulation Management History:      The patient is taking warfarin and comes in today for a routine follow up visit.  Positive risk factors for bleeding include an age of 24 years or older.  Negative risk factors for bleeding include no history of CVA/TIA.  The bleeding index is 'intermediate risk'.  Positive CHADS2 values include History of HTN and Age > 37 years old.  Negative CHADS2 values include History of Diabetes and Prior Stroke/CVA/TIA.  The start date was 12/24/2002.  Her last INR was 5.2 RATIO.  Anticoagulation responsible provider: Johnsie Cancel MD, Collier Salina.  INR POC: 4.1.  Cuvette Lot#: [id number].  Exp: 10/2010.    Anticoagulation Management Assessment/Plan:      The patient's current anticoagulation dose is Warfarin sodium 2.5 mg tabs: Use as directed by Anticoagualtion Clinic.  The target INR is 2 - 3.  The next INR is due 09/14/2009.  Anticoagulation instructions were given to patient.  Results were reviewed/authorized by Tula Nakayama, RN, BSN.  She was notified by Tula Nakayama, RN, BSN.         Prior Anticoagulation Instructions: INR 2.1  Take 2.5 tablets today.  Then start NEW dosing schedule of 2 tablets every day, except take 1 tablet on Saturday.   I will follow-up with Dr. Blake Divine and call you with their  recommendation as well as to schedule a follow-up visit.  Current Anticoagulation Instructions: INR 4.1 Skip today's dose then change dose to 1 tablet everyday excpet 2 tablets on Mondays, Wednesdays and Fridays. Recheck in 10 days.  Prescriptions: WARFARIN SODIUM 2.5 MG TABS (WARFARIN SODIUM) Use as directed by Anticoagualtion Clinic Brand medically necessary #50 x 3   Entered by:   Tula Nakayama, RN, BSN   Authorized by:   Nikki Dom, MD, [facility]   Signed by:   Tula Nakayama, RN, BSN on 09/04/2009   Method used:   Electronically to        [id number]* (retail)       [address]       [place], [place]  [address]       Ph: [phone] or [phone]       Fax: [phone]   RxID:   [id number]

## 2010-08-16 NOTE — Progress Notes (Signed)
Summary: med questions regarding coumadin/ colonscopy  Phone Note Call from Patient Call back at Home Phone [phone] Call back at [phone]   Caller: Patient Reason for Call: Talk to Nurse Summary of Call: has some questions about stopping coumadin for colonoscopy Initial call taken by: Darnell Level,  July 27, 2009 8:24 AM  Follow-up for Phone Call        per shannon,  faxed form on 07-19-09. pt having colonscopy on 1-19-. need to stop coumadin 5 day prior. need an answer today. phone # [phone] Neil Crouch  July 27, 2009 8:49 AM   Additional Follow-up for Phone Call Additional follow up Details #1::        Pt wanted to know if Dr. Caryl Comes did in fact say it was ok for her to go off of her Coumadin. I told her yes that I had s/w him yesterday. She was concerned about having an episode of AF while off of her coumadin and if it would increase her stroke risk. I assured her that Dr. Caryl Comes would not have OK'd it if he did not feel that it was safe for her. She was thankful and felt reassured. Additional Follow-up by: Barnett Abu, RN, BSN,  July 27, 2009 12:15 PM

## 2010-08-16 NOTE — Progress Notes (Signed)
Summary:  /pt rtn your call/lg  Phone Note Outgoing Call Call back at Home Phone [phone]   Call placed by: Barnett Abu, RN, BSN,  August 04, 2009 3:29 PM Call placed to: Patient Summary of Call: Called patient and left message on machine Per Dr. Caryl Comes, the pt can either have a Cardioversion after 4 weeks of therapeutic INR's or if she doesn't want to wait we can do a TEE/Cardioversion after her INR is above 2.0. Barnett Abu, RN, BSN  August 04, 2009 3:31 PM   Follow-up for Phone Call        returning call, [name] [name]  August 04, 2009 4:38 PM   pt rtn your call you can reach her at home number/lg [name] [name]  August 07, 2009 1:51 PM   Additional Follow-up for Phone Call Additional follow up Details #1::        Pt would like to wait for 3 more INR's in hopes that she converts on her own. She will let me know if things change and she becomes symptomatic and decides to move forward with the TEE/DCCV.  Additional Follow-up by: Barnett Abu, RN, BSN,  August 07, 2009 5:26 PM

## 2010-08-16 NOTE — Progress Notes (Signed)
Summary: pt off coumadin/a fib x 6 days  Phone Note Call from Patient   Caller: Patient [phone] or [phone] Reason for Call: Talk to Nurse Summary of Call: pt calling re going off coumadin on sunday but has been in a fib for 6 days-pls advise Initial call taken by: Lorenda Hatchet,  May 24, 2010 8:50 AM  Follow-up for Phone Call        Discussed with her that risks are similar for AF parxoysms v persistent.  Her irregulaity is not similar to prior AF  ; she does have history of PVCc;  she will come in for ECG.  Her preference was to have her minor surgery as scheduled next week, and we would thus defer DCCV until reanticoagulated Follow-up by: Nikki Dom, MD, [facility],  May 24, 2010 9:55 AM

## 2010-08-16 NOTE — Medication Information (Signed)
Summary: rov/sp  Anticoagulant Therapy  Managed by: Ella Jubilee, PharmD Referring MD: Virl Axe MD PCP: Dr. [name] Supervising MD: Caryl Comes MD, Remo Lipps Indication 1: Atrial Fibrillation (ICD-427.31) Indication 2: DCCV Pending (08/08/2009) see weekly Lab Used: [place] Site: [facility] INR POC 2.5 INR RANGE 2 - 3  Dietary changes: no    Health status changes: no    Bleeding/hemorrhagic complications: no    Recent/future hospitalizations: no    Any changes in medication regimen? no    Recent/future dental: no  Any missed doses?: no       Is patient compliant with meds? yes       Current Medications (verified): 1)  Acebutolol Hcl 200 Mg Caps (Acebutolol Hcl) .Marland Kitchen.. 1 Tab By Mouth Two Times A Day. 2)  Diovan Hct 320-12.5 Mg Tabs (Valsartan-Hydrochlorothiazide) .... Take One Tablet Once Daily 3)  Propranolol Hcl 10 Mg Tabs (Propranolol Hcl) .... Take 1 Tablet As Needed 4)  Calcium 600/vitamin D 600-400 Mg-Unit Chew (Calcium Carbonate-Vitamin D) .... Take 1 Tablet Two Times A Day 5)  Multivitamins  Tabs (Multiple Vitamin) .... Take One Tablet Once Daily 6)  D 1000 Plus  Tabs (Fa-Cyanocobalamin-B6-D-Ca) .Marland Kitchen.. 1 Tab Daily 7)  Warfarin Sodium 2.5 Mg Tabs (Warfarin Sodium) .... Use As Directed By Anticoagualtion Clinic  Allergies (verified): 1)  ! Pcn 2)  ! Codeine 3)  ! * Statins  Anticoagulation Management History:      Positive risk factors for bleeding include an age of 75 years or older.  Negative risk factors for bleeding include no history of CVA/TIA.  The bleeding index is 'intermediate risk'.  Positive CHADS2 values include History of HTN and Age > 75 years old.  Negative CHADS2 values include History of Diabetes and Prior Stroke/CVA/TIA.  The start date was 12/24/2002.  Her last INR was 5.2 RATIO.  Anticoagulation responsible Anicka Stuckert: Caryl Comes MD, Remo Lipps.  INR POC: 2.5.  Exp: 04/2011.    Anticoagulation Management Assessment/Plan:      The patient's current  anticoagulation dose is Warfarin sodium 2.5 mg tabs: Use as directed by Anticoagualtion Clinic.  The target INR is 2 - 3.  The next INR is due 08/07/2010.  Anticoagulation instructions were given to patient.  Results were reviewed/authorized by Ella Jubilee, PharmD.         Prior Anticoagulation Instructions: INR 3.0  Continue same dose of 2 tablets every day except 1 tablet on Tuesday and Thursday.  Recheck INR in 3 weeks.   Current Anticoagulation Instructions: Cont with current regimen Return to clinic on Jan 23rd, at 9 am

## 2010-08-16 NOTE — Progress Notes (Signed)
Summary: pt has questions  Phone Note Call from Patient Call back at Home Phone [phone]   Caller: Patient Reason for Call: Talk to Nurse, Talk to Doctor Summary of Call: pt has questions because she is on an antibiotic and she has not been eating good and she wants to know if she needs a sooner appt Initial call taken by: Shelda Pal,  September 22, 2009 9:56 AM  Follow-up for Phone Call        Called spoke with pt.  Pt states she has had an intestinal flu and has been taking Flagyl and has had diarrhea x 1 week and decr appetite.  Made pt appt to have coumadin checked today in the office.   Follow-up by: Freddrick March RN,  September 22, 2009 10:02 AM

## 2010-08-16 NOTE — Medication Information (Signed)
Summary: rov/ewj  Anticoagulant Therapy  Managed by: Freddrick March, RN, BSN Referring MD: Virl Axe MD PCP: Dr. [name] Supervising MD: Stanford Breed MD, Aaron Edelman Indication 1: Atrial Fibrillation (ICD-427.31) Indication 2: DCCV Pending (08/08/2009) see weekly Lab Used: [facility] Site: [facility] INR POC 2.2 INR RANGE 2 - 3  Dietary changes: no    Health status changes: no    Bleeding/hemorrhagic complications: no    Recent/future hospitalizations: no    Any changes in medication regimen? no    Recent/future dental: no  Any missed doses?: no       Is patient compliant with meds? yes       Allergies (verified): 1)  ! Pcn 2)  ! Codeine 3)  ! * Statins  Anticoagulation Management History:      The patient is taking warfarin and comes in today for a routine follow up visit.  Positive risk factors for bleeding include an age of 45 years or older.  Negative risk factors for bleeding include no history of CVA/TIA.  The bleeding index is 'intermediate risk'.  Positive CHADS2 values include History of HTN and Age > 87 years old.  Negative CHADS2 values include History of Diabetes and Prior Stroke/CVA/TIA.  The start date was 12/24/2002.  Her last INR was 5.2 RATIO.  Anticoagulation responsible provider: Stanford Breed MD, Aaron Edelman.  INR POC: 2.2.  Cuvette Lot#: [id number].  Exp: 10/2010.    Anticoagulation Management Assessment/Plan:      The patient's current anticoagulation dose is Warfarin sodium 2.5 mg tabs: Use as directed by Anticoagualtion Clinic.  The target INR is 2 - 3.  The next INR is due 08/21/2009.  Anticoagulation instructions were given to patient.  Results were reviewed/authorized by Freddrick March, RN, BSN.  She was notified by Freddrick March RN.         Prior Anticoagulation Instructions: INR 2.5  Stop Lovenox. Continue taking warfarin 2.74m daily except 561mon Mondays, Wednesdays, and Fridays. Recheck in 1 week.  Current Anticoagulation Instructions: INR 2.2  Pending  cardioversion.  Take 2 tablets today and tomorrow then resume same dosage 2 tablets daily except 1 tablet on Tuesdays, Thursdays and Sundays.  Recheck in 1 week.

## 2010-08-16 NOTE — Progress Notes (Signed)
Summary: colonoscopy/afib  Phone Note Call from Patient Call back at Home Phone [phone]   Caller: Patient Reason for Call: Talk to Nurse Summary of Call: request to speak to nurse about colonoscopy tomorrow, pt is in afib now Initial call taken by: Darnell Level,  August 01, 2009 8:38 AM  Follow-up for Phone Call        Pt calling concerned because she is off her Coumadin for her procedure tomorrow and is in AF.  I called Dr. Caryl Comes and his suggestion is that she take one tablet Coumadin tonight, which will not take full effect until 48hours. He also wanted to pass on to her that people normally only know 15% of the time that they are in AF (she may actually be in it more than she even realizes).  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 States she would not be nervous at all had she not went back into AF.  This is a 5 yr routine procedure. I told her that she always has the option to reschedule and try again later, if she is that nervous. Pt understands and will s/w her surgeon.  Follow-up by: Barnett Abu, RN, BSN,  August 01, 2009 11:32 AM

## 2010-08-16 NOTE — Medication Information (Signed)
Summary: [facility]v coumadin - [facility]  Anticoagulant Therapy  Managed by: Porfirio Oar, PharmD Referring MD: Virl Axe MD PCP: Dr. [name] Supervising MD: Johnsie Cancel MD, Collier Salina Indication 1: Atrial Fibrillation (ICD-427.31) Indication 2: DCCV Pending (08/08/2009) see weekly Lab Used: [facility] Site: [facility] INR POC 3.0 INR RANGE 2 - 3  Dietary changes: no    Health status changes: no    Bleeding/hemorrhagic complications: no    Recent/future hospitalizations: no    Any changes in medication regimen? no    Recent/future dental: no  Any missed doses?: no       Is patient compliant with meds? yes       Allergies: 1)  ! Pcn 2)  ! Codeine 3)  ! * Statins  Anticoagulation Management History:      The patient is taking warfarin and comes in today for a routine follow up visit.  Positive risk factors for bleeding include an age of 42 years or older.  Negative risk factors for bleeding include no history of CVA/TIA.  The bleeding index is 'intermediate risk'.  Positive CHADS2 values include History of HTN and Age > 66 years old.  Negative CHADS2 values include History of Diabetes and Prior Stroke/CVA/TIA.  The start date was 12/24/2002.  Her last INR was 5.2 RATIO.  Anticoagulation responsible provider: Johnsie Cancel MD, Collier Salina.  INR POC: 3.0.  Cuvette Lot#: [id number].  Exp: 04/2011.    Anticoagulation Management Assessment/Plan:      The patient's current anticoagulation dose is Warfarin sodium 2.5 mg tabs: Use as directed by Anticoagualtion Clinic.  The target INR is 2 - 3.  The next INR is due 07/10/2010.  Anticoagulation instructions were given to patient.  Results were reviewed/authorized by Porfirio Oar, PharmD.  She was notified by Porfirio Oar PharmD.         Prior Anticoagulation Instructions: INR 2.6  Coumadin 2.42m tabs Take 1 tab today WED 11/23 then 1 tab Tue, Thur and 2 tabs all other days  Current Anticoagulation Instructions: INR 3.0  Continue same dose of 2 tablets every day  except 1 tablet on Tuesday and Thursday.  Recheck INR in 3 weeks.

## 2010-08-16 NOTE — Medication Information (Signed)
Summary: [name] [name]  Anticoagulant Therapy  Managed by: Gwynneth Albright, PharmD Referring MD: Virl Axe MD PCP: Dr. [name] Supervising MD: Johnsie Cancel MD, Collier Salina Indication 1: Atrial Fibrillation (ICD-427.31) Indication 2: DCCV Pending (08/08/2009) see weekly Lab Used: [facility] Site: [facility] INR POC 2.2 INR RANGE 2 - 3  Dietary changes: no    Health status changes: no    Bleeding/hemorrhagic complications: no    Recent/future hospitalizations: no    Any changes in medication regimen? no    Recent/future dental: no  Any missed doses?: no       Is patient compliant with meds? yes       Allergies: 1)  ! Pcn 2)  ! Codeine 3)  ! * Statins  Anticoagulation Management History:      The patient is taking warfarin and comes in today for a routine follow up visit.  Positive risk factors for bleeding include an age of 75 years or older.  Negative risk factors for bleeding include no history of CVA/TIA.  The bleeding index is 'intermediate risk'.  Positive CHADS2 values include History of HTN and Age > 75 years old.  Negative CHADS2 values include History of Diabetes and Prior Stroke/CVA/TIA.  The start date was 12/24/2002.  Her last INR was 5.2 RATIO.  Anticoagulation responsible provider: Johnsie Cancel MD, Collier Salina.  INR POC: 2.2.  Cuvette Lot#: [id number].  Exp: 11/2010.    Anticoagulation Management Assessment/Plan:      The patient's current anticoagulation dose is Warfarin sodium 2.5 mg tabs: Use as directed by Anticoagualtion Clinic.  The target INR is 2 - 3.  The next INR is due 11/13/2009.  Anticoagulation instructions were given to patient.  Results were reviewed/authorized by Gwynneth Albright, PharmD.  She was notified by Gwynneth Albright, PharmD.         Prior Anticoagulation Instructions: INR 1.4   Take an extra 1/2 tablet today, then 2 tablets tomorrow then start taking 2 tablets daily except 1 tablet on Tuesdays, Thursdays and Saturdays.  Recheck in 10 days.     Current  Anticoagulation Instructions: INR 2.2. Take 2 tablets daily except 1 tablet on Tues, Thurs, Sun.  Recheck in 2-3 weeks.

## 2010-08-16 NOTE — Medication Information (Signed)
Summary: rov/sl  Anticoagulant Therapy  Managed by: Tula Nakayama, RN, BSN Referring MD: Virl Axe MD PCP: Dr. [name] Supervising MD: Acie Fredrickson MD, Arnette Norris Indication 1: Atrial Fibrillation (ICD-427.31) Indication 2: DCCV Pending (08/08/2009) see weekly Lab Used: [facility] Site: [facility] INR POC 1.9 INR RANGE 2 - 3  Dietary changes: no    Health status changes: yes       Details: Pt states she has been in Afib since Saturday.   Bleeding/hemorrhagic complications: no    Recent/future hospitalizations: no    Any changes in medication regimen? no    Recent/future dental: no  Any missed doses?: yes     Details: Was holding for procedure but cancelled and restarted on  05/15/10  Is patient compliant with meds? yes      Comments: Previous Sx cancelled due to lingering infection, R/S to 05/31/10 per MD instructions take last dose on 05/27/10. Flag sent to Dr Olin Pia nurse about procedure and AFib.   Allergies: 1)  ! Pcn 2)  ! Codeine 3)  ! * Statins  Anticoagulation Management History:      The patient is taking warfarin and comes in today for a routine follow up visit.  Positive risk factors for bleeding include an age of 61 years or older.  Negative risk factors for bleeding include no history of CVA/TIA.  The bleeding index is 'intermediate risk'.  Positive CHADS2 values include History of HTN and Age > 67 years old.  Negative CHADS2 values include History of Diabetes and Prior Stroke/CVA/TIA.  The start date was 12/24/2002.  Her last INR was 5.2 RATIO.  Anticoagulation responsible provider: Nahser MD, Arnette Norris.  INR POC: 1.9.  Cuvette Lot#: [id number].  Exp: 05/2011.    Anticoagulation Management Assessment/Plan:      The patient's current anticoagulation dose is Warfarin sodium 2.5 mg tabs: Use as directed by Anticoagualtion Clinic.  The target INR is 2 - 3.  The next INR is due 06/06/2010.  Anticoagulation instructions were given to patient.  Results were reviewed/authorized  by Tula Nakayama, RN, BSN.  She was notified by Tula Nakayama, RN, BSN.         Prior Anticoagulation Instructions: INR 4.0  Skip tomorrow's dose, and take 1 tablet on Sunday.  Then resume normal Coumadin schedule of 2 tablets daily except 1 tablet on Tuesday and Thursday.    We will call you to inform you if the surgeon wants you to stop your Coumadin on Wednesday 10/26 or Thursday 10/27.    Resume normal Coumadin schedule after surgery, and return to clinic one week later, on November 7th, 2011.    Current Anticoagulation Instructions: INR 1.9 Today take 7.5mgs then continue with 5mgs everyday except 2.5mgs on Tuesdays and Thursdays.  Per MD insturctions take last dose on 05/27/10. Will call pt after Dr Kleins nurse discuss this with him today.   Resume post procedure taking 2 tablets daily.  Prescriptions: WARFARIN SODIUM 2.5 MG TABS (WARFARIN SODIUM) Use as directed by Anticoagualtion Clinic  #50 x 3   Entered by:   Tiffany Muse, RN, BSN   Authorized by:   Steven Cochran Klein, MD, FACC   Signed by:   Tiffany Muse, RN, BSN on 05/21/2010   Method used:   Electronically to        [facility]* (retail)       [address]  [place], [place]  [address]       Ph: [phone] or [phone]       Fax: [phone]   RxID:   [id number]

## 2010-08-16 NOTE — Medication Information (Signed)
Summary: rov/eac  Anticoagulant Therapy  Managed by: Porfirio Oar, PharmD Referring MD: Virl Axe MD PCP: Dr. [name] Supervising MD: Harrington Challenger MD, Nevin Bloodgood Indication 1: Atrial Fibrillation (ICD-427.31) Indication 2: DCCV Pending (08/08/2009) see weekly Lab Used: [facility] Site: [facility] INR POC 2.5 INR RANGE 2 - 3  Dietary changes: no    Health status changes: no    Bleeding/hemorrhagic complications: no    Recent/future hospitalizations: no    Any changes in medication regimen? no    Recent/future dental: no  Any missed doses?: no       Is patient compliant with meds? yes       Allergies: 1)  ! Pcn 2)  ! Codeine 3)  ! * Statins  Anticoagulation Management History:      The patient is taking warfarin and comes in today for a routine follow up visit.  Positive risk factors for bleeding include an age of 75 years or older.  Negative risk factors for bleeding include no history of CVA/TIA.  The bleeding index is 'intermediate risk'.  Positive CHADS2 values include History of HTN and Age > 35 years old.  Negative CHADS2 values include History of Diabetes and Prior Stroke/CVA/TIA.  The start date was 12/24/2002.  Her last INR was 5.2 RATIO.  Anticoagulation responsible provider: Harrington Challenger MD, Nevin Bloodgood.  INR POC: 2.5.  Cuvette Lot#: [id number].  Exp: 02/2011.    Anticoagulation Management Assessment/Plan:      The patient's current anticoagulation dose is Warfarin sodium 2.5 mg tabs: Use as directed by Anticoagualtion Clinic.  The target INR is 2 - 3.  The next INR is due 12/25/2009.  Anticoagulation instructions were given to patient.  Results were reviewed/authorized by Porfirio Oar, PharmD.  She was notified by Porfirio Oar PharmD.         Prior Anticoagulation Instructions: INR 2.0  Start NEW dosing schedule of 2 tablets every day, except take 1 tablet on Tuesday and Thursday.  Return to clinic in 2 weeks.    Current Anticoagulation Instructions: INR 2.5  Continue same dose of 2  tablets every day except 1 tablet on Tuesday and Thursday

## 2010-08-16 NOTE — Medication Information (Signed)
Summary: rov.mp  Anticoagulant Therapy  Managed by: Freddrick March, RN, BSN Referring MD: Virl Axe MD PCP: Dr. [name] Supervising MD: Harrington Challenger MD, Nevin Bloodgood Indication 1: Atrial Fibrillation (ICD-427.31) Indication 2: DCCV Pending (08/08/2009) see weekly Lab Used: [facility] Site: [facility] INR POC 1.4 INR RANGE 2 - 3  Dietary changes: no    Health status changes: no    Bleeding/hemorrhagic complications: no    Recent/future hospitalizations: no    Any changes in medication regimen? no    Recent/future dental: no  Any missed doses?: no       Is patient compliant with meds? yes       Allergies (verified): 1)  ! Pcn 2)  ! Codeine 3)  ! * Statins  Anticoagulation Management History:      The patient is taking warfarin and comes in today for a routine follow up visit.  Positive risk factors for bleeding include an age of 26 years or older.  Negative risk factors for bleeding include no history of CVA/TIA.  The bleeding index is 'intermediate risk'.  Positive CHADS2 values include History of HTN and Age > 95 years old.  Negative CHADS2 values include History of Diabetes and Prior Stroke/CVA/TIA.  The start date was 12/24/2002.  Her last INR was 5.2 RATIO.  Anticoagulation responsible provider: Harrington Challenger MD, Nevin Bloodgood.  INR POC: 1.4.  Cuvette Lot#: [id number].  Exp: 11/2010.    Anticoagulation Management Assessment/Plan:      The patient's current anticoagulation dose is Warfarin sodium 2.5 mg tabs: Use as directed by Anticoagualtion Clinic.  The target INR is 2 - 3.  The next INR is due 10/26/2009.  Anticoagulation instructions were given to patient.  Results were reviewed/authorized by Freddrick March, RN, BSN.  She was notified by Freddrick March RN.         Prior Anticoagulation Instructions: INR 1.9  Continue normal dose 1 tab daily except 2 tabs on Monday, Wednesday, Friday.    Current Anticoagulation Instructions: INR 1.4   Take an extra 1/2 tablet today, then 2 tablets tomorrow  then start taking 2 tablets daily except 1 tablet on Tuesdays, Thursdays and Saturdays.  Recheck in 10 days.

## 2010-08-16 NOTE — Progress Notes (Signed)
Summary: coumadin question  Phone Note Call from Patient Call back at Home Phone [phone] Call back at [phone]   Caller: Patient Reason for Call: Talk to Nurse Summary of Call: has question about coumadin Initial call taken by: Darnell Level,  September 08, 2009 9:38 AM  Follow-up for Phone Call        Spoke with pt. about Rx- generic called in and not brand. She will switch over to generic next week when Rx runs out. She is willing to switch to generic. Follow-up by: Tula Nakayama, RN, BSN,  September 08, 2009 11:00 AM

## 2010-08-16 NOTE — Progress Notes (Signed)
Summary: question about coumadin  Phone Note Call from Patient Call back at Home Phone [phone]   Caller: Patient Summary of Call: pt have question about her coumadin Initial call taken by: Delsa Sale,  August 01, 2009 12:58 PM  Follow-up for Phone Call        D/W pt at 08/01/2009 at 200 pm...Marland Kitchenok to restart warfarin immediately after procedure.   Follow-up by: Alinda Deem PharmD, BCPS, CPP,  August 01, 2009 2:32 PM

## 2010-08-16 NOTE — Medication Information (Signed)
Summary: rov/ewj  Anticoagulant Therapy  Managed by: Margaretha Sheffield, PharmD Referring MD: Virl Axe MD PCP: Dr. [name] Supervising MD: Stanford Breed MD, Aaron Edelman Indication 1: Atrial Fibrillation (ICD-427.31) Indication 2: DCCV Pending (08/08/2009) see weekly Lab Used: [facility] Site: [facility] INR POC 2.2 INR RANGE 2 - 3  Dietary changes: no    Health status changes: no    Bleeding/hemorrhagic complications: no    Recent/future hospitalizations: no    Any changes in medication regimen? no    Recent/future dental: no  Any missed doses?: no       Is patient compliant with meds? yes      Comments: Pt maybe pending cardioversion...not 100% sure if procedure still planned  Current Medications (verified): 1)  Acebutolol Hcl 200 Mg Caps (Acebutolol Hcl) .Marland Kitchen.. 1 Tab By Mouth Two Times A Day. 2)  Diovan Hct 320-12.5 Mg Tabs (Valsartan-Hydrochlorothiazide) .... Take One Tablet Once Daily 3)  Propranolol Hcl 10 Mg Tabs (Propranolol Hcl) .... Take 1 Tablet As Needed 4)  Calcium 600/vitamin D 600-400 Mg-Unit Chew (Calcium Carbonate-Vitamin D) .... Take 1 Tablet Two Times A Day 5)  Multivitamins  Tabs (Multiple Vitamin) .... Take One Tablet Once Daily 6)  D 1000 Plus  Tabs (Fa-Cyanocobalamin-B6-D-Ca) .Marland Kitchen.. 1 Tab Daily 7)  Warfarin Sodium 2.5 Mg Tabs (Warfarin Sodium) .... Use As Directed By Anticoagualtion Clinic 8)  Tramadol Hcl 50 Mg Tabs (Tramadol Hcl) .... 1/2 To 1 Tablet Every 6 Hrs As Needed 9)  Claritin 10 Mg Tabs (Loratadine) .... Take 1 Tablet By Mouth Once A Day As Needed  Allergies (verified): 1)  ! Pcn 2)  ! Codeine 3)  ! * Statins  Anticoagulation Management History:      The patient is taking warfarin and comes in today for a routine follow up visit.  Positive risk factors for bleeding include an age of 75 years or older.  Negative risk factors for bleeding include no history of CVA/TIA.  The bleeding index is 'intermediate risk'.  Positive CHADS2 values include  History of HTN and Age > 6 years old.  Negative CHADS2 values include History of Diabetes and Prior Stroke/CVA/TIA.  The start date was 12/24/2002.  Her last INR was 5.2 RATIO.  Anticoagulation responsible provider: Stanford Breed MD, Aaron Edelman.  INR POC: 2.2.  Cuvette Lot#: [id number].  Exp: 10/2010.    Anticoagulation Management Assessment/Plan:      The patient's current anticoagulation dose is Warfarin sodium 2.5 mg tabs: Use as directed by Anticoagualtion Clinic.  The target INR is 2 - 3.  The next INR is due 08/28/2009.  Anticoagulation instructions were given to patient.  Results were reviewed/authorized by Margaretha Sheffield, PharmD.  She was notified by Margaretha Sheffield.         Prior Anticoagulation Instructions: INR 2.2  Pending cardioversion.  Take 2 tablets today and tomorrow then resume same dosage 2 tablets daily except 1 tablet on Tuesdays, Thursdays and Sundays.  Recheck in 1 week.     Current Anticoagulation Instructions: INR 2.2  Pending cardioversion. Start new dosing schedule of 2 tablets daily, except 1 tablet on Thursday and Saturday. Return to clinic 1 week.

## 2010-08-16 NOTE — Medication Information (Signed)
Summary: rov/sp  Anticoagulant Therapy  Managed by: Porfirio Oar, PharmD Referring MD: Virl Axe MD PCP: Dr. [name] Supervising MD: Aundra Dubin MD, Kirk Ruths Indication 1: Atrial Fibrillation (ICD-427.31) Indication 2: DCCV Pending (08/08/2009) see weekly Lab Used: [facility] Site: [facility] INR POC 2.3 INR RANGE 2 - 3  Dietary changes: yes       Details: haven't had any green vegetables this week  Health status changes: yes       Details: has a cold  Bleeding/hemorrhagic complications: no    Recent/future hospitalizations: no    Any changes in medication regimen? yes       Details: started azithromycin 5 day course on friday 7/8  Recent/future dental: no  Any missed doses?: yes     Details: missed one dose, called in and was instructed to take an extra half dose  Is patient compliant with meds? yes       Allergies: 1)  ! Pcn 2)  ! Codeine 3)  ! * Statins  Anticoagulation Management History:      The patient is taking warfarin and comes in today for a routine follow up visit.  Positive risk factors for bleeding include an age of 75 years or older.  Negative risk factors for bleeding include no history of CVA/TIA.  The bleeding index is 'intermediate risk'.  Positive CHADS2 values include History of HTN and Age > 72 years old.  Negative CHADS2 values include History of Diabetes and Prior Stroke/CVA/TIA.  The start date was 12/24/2002.  Her last INR was 5.2 RATIO.  Anticoagulation responsible provider: Aundra Dubin MD, Neira Bentsen.  INR POC: 2.3.  Cuvette Lot#: [id number].  Exp: 03/2011.    Anticoagulation Management Assessment/Plan:      The patient's current anticoagulation dose is Warfarin sodium 2.5 mg tabs: Use as directed by Anticoagualtion Clinic.  The target INR is 2 - 3.  The next INR is due 02/16/2010.  Anticoagulation instructions were given to patient.  Results were reviewed/authorized by Porfirio Oar, PharmD.  She was notified by Lind Covert.         Prior Anticoagulation  Instructions: INR 2.9  Continue same dose of 2 tablets every day except 1 tablet on Tuesday and Thursday  Current Anticoagulation Instructions: INR 2.3  Continue same regimen of 1 tab on Tuesday and Thursday and 2 tabs all other days. Re-check INR in 4 weeks.

## 2010-08-16 NOTE — Progress Notes (Signed)
  Phone Note Outgoing Call   Call placed by: Barnett Abu, RN, BSN,  August 31, 2009 11:00 AM Call placed to: Patient Summary of Call: Pt will come in for Nurse Room visit for an EKG. This will determine whether she needs the Cardioversion or not.  Initial call taken by: Barnett Abu, RN, BSN,  August 31, 2009 11:01 AM

## 2010-08-16 NOTE — Progress Notes (Signed)
Summary: lab results  Phone Note Call from Patient Call back at Home Phone [phone] Call back at [phone]   Caller: Patient Reason for Call: Lab or Test Results Summary of Call: request lab results Initial call taken by: Darnell Level,  October 24, 2009 9:36 AM  Follow-up for Phone Call        pt aware labs ok Kevan Rosebush, RN  October 24, 2009 10:09 AM

## 2010-08-16 NOTE — Medication Information (Signed)
Summary: rov/sp  Anticoagulant Therapy  Managed by: Porfirio Oar, PharmD Referring MD: Virl Axe MD PCP: Dr. [name] Supervising MD: Johnsie Cancel MD, Collier Salina Indication 1: Atrial Fibrillation (ICD-427.31) Indication 2: DCCV Pending (08/08/2009) see weekly Lab Used: [facility] Site: [facility] INR POC 2.9 INR RANGE 2 - 3  Dietary changes: no    Health status changes: no    Bleeding/hemorrhagic complications: no    Recent/future hospitalizations: no    Any changes in medication regimen? no    Recent/future dental: no  Any missed doses?: no       Is patient compliant with meds? yes       Allergies: 1)  ! Pcn 2)  ! Codeine 3)  ! * Statins  Anticoagulation Management History:      The patient is taking warfarin and comes in today for a routine follow up visit.  Positive risk factors for bleeding include an age of 10 years or older.  Negative risk factors for bleeding include no history of CVA/TIA.  The bleeding index is 'intermediate risk'.  Positive CHADS2 values include History of HTN and Age > 57 years old.  Negative CHADS2 values include History of Diabetes and Prior Stroke/CVA/TIA.  The start date was 12/24/2002.  Her last INR was 5.2 RATIO.  Anticoagulation responsible provider: Johnsie Cancel MD, Collier Salina.  INR POC: 2.9.  Cuvette Lot#: [id number].  Exp: 02/2011.    Anticoagulation Management Assessment/Plan:      The patient's current anticoagulation dose is Warfarin sodium 2.5 mg tabs: Use as directed by Anticoagualtion Clinic.  The target INR is 2 - 3.  The next INR is due 01/22/2010.  Anticoagulation instructions were given to patient.  Results were reviewed/authorized by Porfirio Oar, PharmD.  She was notified by Porfirio Oar PharmD.         Prior Anticoagulation Instructions: INR 2.5  Continue same dose of 2 tablets every day except 1 tablet on Tuesday and Thursday   Current Anticoagulation Instructions: INR 2.9  Continue same dose of 2 tablets every day except 1 tablet on  Tuesday and Thursday

## 2010-08-20 ENCOUNTER — Telehealth: Payer: Self-pay | Admitting: Internal Medicine

## 2010-08-29 ENCOUNTER — Telehealth: Payer: Self-pay | Admitting: Internal Medicine

## 2010-08-30 ENCOUNTER — Encounter (INDEPENDENT_AMBULATORY_CARE_PROVIDER_SITE_OTHER): Payer: Self-pay | Admitting: *Deleted

## 2010-08-30 ENCOUNTER — Encounter: Payer: Self-pay | Admitting: Internal Medicine

## 2010-08-30 ENCOUNTER — Encounter (INDEPENDENT_AMBULATORY_CARE_PROVIDER_SITE_OTHER): Payer: Medicare Other

## 2010-08-30 DIAGNOSIS — I4891 Unspecified atrial fibrillation: Secondary | ICD-10-CM

## 2010-08-30 NOTE — Progress Notes (Addendum)
Summary: shoulder surgery  Phone Note Call from Patient Call back at Home Phone [phone]   Caller: Patient Summary of Call: pt having shoulder surgery. pt states  dr. supple  office sent a letter re pt coumadin dr office is waiting to her from the nurse or doctor re meds. Initial call taken by: Regan Lemming,  August 20, 2010 12:18 PM  Follow-up for Phone Call        Phone Call Completed NOTE FAXED  TO DR SUPPLE'S OFFICE Follow-up by: Devra Dopp, LPN,  August 20, 8370 5:50 PM     Appended Document: shoulder surgery NO BRIDGING  MAY HOLD COUMADIN 5 DAYS PRIOR TO SURGERY OR NEEDS CARDIOVERSION FIRST AND RESCHEDULE PROCEDURE./CY

## 2010-09-03 ENCOUNTER — Encounter: Payer: Self-pay | Admitting: Internal Medicine

## 2010-09-03 ENCOUNTER — Encounter (INDEPENDENT_AMBULATORY_CARE_PROVIDER_SITE_OTHER): Payer: Medicare Other

## 2010-09-03 DIAGNOSIS — Z7901 Long term (current) use of anticoagulants: Secondary | ICD-10-CM

## 2010-09-03 DIAGNOSIS — I4891 Unspecified atrial fibrillation: Secondary | ICD-10-CM

## 2010-09-04 DIAGNOSIS — I4891 Unspecified atrial fibrillation: Secondary | ICD-10-CM

## 2010-09-05 NOTE — Assessment & Plan Note (Signed)
Summary: EKG/427.31/dr. klein/wpa  Nurse Visit   Vital Signs:  Patient profile:   77 year old female Height:      65 inches Pulse rate:   88 / minute Pulse rhythm:   irregularly irregular Resp:     18 per minute BP sitting:   148 / 88  (right arm) Cuff size:   regular  Vitals Entered By: Sim Boast, RN (August 30, 2010 10:09 AM)  Current Medications (verified): 1)  Acebutolol Hcl 200 Mg Caps (Acebutolol Hcl) .Marland Kitchen.. 1 Tab By Mouth Two Times A Day. 2)  Diovan Hct 320-12.5 Mg Tabs (Valsartan-Hydrochlorothiazide) .... Take One Tablet Once Daily 3)  Calcium 600/vitamin D 600-400 Mg-Unit Chew (Calcium Carbonate-Vitamin D) .... Take 1 Tablet Two Times A Day 4)  Multivitamins  Tabs (Multiple Vitamin) .... Take One Tablet Once Daily 5)  D 1000 Plus  Tabs (Fa-Cyanocobalamin-B6-D-Ca) .Marland Kitchen.. 1 Tab Daily 6)  Warfarin Sodium 2.5 Mg Tabs (Warfarin Sodium) .... Use As Directed By Anticoagualtion Clinic  Allergies (verified): 1)  ! Pcn 2)  ! Codeine 3)  ! * Statins  Comments:  Provider: Pt here for EKG as she feels as though she is in At Fib and has been for 9 days now.  EKG does demonstrate At Fib with a rate of 88 to 92 with a rare PVC.  Pt is on coumadin but is concerned because she is to have right shoulder surgery in one week.  Reviewed with Dr Caryl Comes - at this point the pts options are to be cardioverted and delay her surgery for 1 month or go on and have the surgery and cardiovert 1 month after if she remains in At Fib.  The patient states she does not want to be "shocked" and that it is her intention to go ahead with surgery as planned.  She is hopeful she will convert on her on as she has in the past.  She was encouraged to call back if she become symptomatic or has any concerns.  She is in agreement and left with all questions answered.

## 2010-09-05 NOTE — Letter (Signed)
Summary: [facility]:   [facility]:   Imported By: Roddie Mc 08/30/2010 16:01:32  _____________________________________________________________________  External Attachment:    Type:   Image     Comment:   External Document

## 2010-09-05 NOTE — Progress Notes (Addendum)
Summary: pt in a-fib  Phone Note Call from Patient Call back at [phone]   Caller: Patient Reason for Call: Talk to Nurse, Talk to Doctor Summary of Call: pt has been in a-fib since last Wednesday and should she do anything or just let it go cause it is not bothering her  Initial call taken by: Shelda Pal,  August 29, 2010 8:05 AM  Follow-up for Phone Call        Patient stated that her pulse has been irregular and she feels she may be in afib. She has been feeling okay although she feels dizzy sometimes. Her BP is 114/70 and HR has been in the 80s. She is supposed to have shoulder surgery in a few weeks and will prob. need to hold her Coumadin. I have asked her to come into the office tomorrow for an EKG. She will come @ 10am. I will forward this to Dr. Caryl Comes for review. Whitney Jannett Celestine RN  August 29, 2010 8:54 AM  Follow-up by: Whitney Jannett Celestine RN,  August 29, 2010 8:55 AM  Additional Follow-up for Phone Call Additional follow up Details #1::        pt came in for EKG  Additional Follow-up by: Sim Boast, RN,  August 30, 2010 3:02 PM

## 2010-09-11 NOTE — Medication Information (Signed)
Summary: rov/sp  Anticoagulant Therapy  Managed by: Alycia Rossetti, PharmD Referring MD: Virl Axe MD PCP: Dr. [name] Supervising MD: Harrington Challenger MD, Nevin Bloodgood Indication 1: Atrial Fibrillation (ICD-427.31) Indication 2: DCCV Pending (08/08/2009) see weekly Lab Used: [facility]  Site: [facility] INR POC 3.6 INR RANGE 2 - 3  Dietary changes: no    Health status changes: no    Bleeding/hemorrhagic complications: no    Recent/future hospitalizations: yes       Details: Will begin holding coumadin on Thursday (2/22) for an orthoscopic shouldersurgery on 2/28 for bone spurs in the right shoulder  Any changes in medication regimen? yes       Details: Azithromycin 5 day course--ended about 1 1/2 weeks ago.  Recent/future dental: no  Any missed doses?: no       Is patient compliant with meds? yes       Allergies: 1)  ! Pcn 2)  ! Codeine 3)  ! * Statins  Anticoagulation Management History:      Positive risk factors for bleeding include an age of 81 years or older.  Negative risk factors for bleeding include no history of CVA/TIA.  The bleeding index is 'intermediate risk'.  Positive CHADS2 values include History of HTN and Age > 30 years old.  Negative CHADS2 values include History of Diabetes and Prior Stroke/CVA/TIA.  The start date was 12/24/2002.  Her last INR was 5.2 RATIO.  Anticoagulation responsible provider: Harrington Challenger MD, Nevin Bloodgood.  INR POC: 3.6.  Cuvette Lot#: [id number].  Exp: 07/2011.    Anticoagulation Management Assessment/Plan:      The patient's current anticoagulation dose is Warfarin sodium 2.5 mg tabs: Use as directed by Anticoagualtion Clinic.  The target INR is 2 - 3.  The next INR is due 09/03/2010.  Anticoagulation instructions were given to patient.  Results were reviewed/authorized by Alycia Rossetti, PharmD.  She was notified by Alycia Rossetti PharmD.         Prior Anticoagulation Instructions: INR:  2.7 (goal 2-3)  Your INR is at goal today.  Continue 5 mg  everyday EXCEPT 2.5 mg on Tuesdays and Thursdays. Return in 4 weeks for another INR check.  Give [place] a call when you know more about your shoulder surgery.  Current Anticoagulation Instructions: Take 1 tablet (2.5 mg) daily until Thursday. Then proceed with how your doctor has told you to hold your coumadin.  INR 3.6

## 2010-09-11 NOTE — Progress Notes (Signed)
Summary: [facility]: Office Visit  [facility]: Office Visit   Imported By: Roddie Mc 08/29/2010 17:11:52  _____________________________________________________________________  External Attachment:    Type:   Image     Comment:   External Document

## 2010-09-19 ENCOUNTER — Telehealth: Payer: Self-pay | Admitting: Internal Medicine

## 2010-09-19 ENCOUNTER — Encounter: Payer: Self-pay | Admitting: Cardiology

## 2010-09-19 ENCOUNTER — Encounter (INDEPENDENT_AMBULATORY_CARE_PROVIDER_SITE_OTHER): Payer: Medicare Other

## 2010-09-19 DIAGNOSIS — Z7901 Long term (current) use of anticoagulants: Secondary | ICD-10-CM

## 2010-09-19 DIAGNOSIS — I4891 Unspecified atrial fibrillation: Secondary | ICD-10-CM

## 2010-09-19 LAB — CONVERTED CEMR LAB: POC INR: 2.4

## 2010-09-25 NOTE — Medication Information (Signed)
Summary: rov/tm  Anticoagulant Therapy  Managed by: Danella Penton, RN Referring MD: Virl Axe MD PCP: Dr. [name] Supervising MD: Ron Parker MD, Dellis Filbert Indication 1: Atrial Fibrillation (ICD-427.31) Indication 2: DCCV Pending (08/08/2009) see weekly Lab Used: [facility] Site: [facility] INR POC 2.4 INR RANGE 2 - 3  Dietary changes: no    Health status changes: no    Bleeding/hemorrhagic complications: no    Recent/future hospitalizations: no    Any changes in medication regimen? no    Recent/future dental: no  Any missed doses?: yes     Details: Resumed coumadin Tuesday 09/11/2010 after cancelled surgery  Is patient compliant with meds? yes       Allergies: 1)  ! Pcn 2)  ! Codeine 3)  ! * Statins  Anticoagulation Management History:      The patient is taking warfarin and comes in today for a routine follow up visit.  Positive risk factors for bleeding include an age of 75 years or older.  Negative risk factors for bleeding include no history of CVA/TIA.  The bleeding index is 'intermediate risk'.  Positive CHADS2 values include History of HTN and Age > 75 years old.  Negative CHADS2 values include History of Diabetes and Prior Stroke/CVA/TIA.  The start date was 12/24/2002.  Her last INR was 5.2 RATIO.  Anticoagulation responsible provider: Ron Parker MD, Dellis Filbert.  INR POC: 2.4.  Cuvette Lot#: [id number].  Exp: 07/2011.    Anticoagulation Management Assessment/Plan:      The patient's current anticoagulation dose is Warfarin sodium 2.5 mg tabs: Use as directed by Anticoagualtion Clinic.  The target INR is 2 - 3.  The next INR is due 10/15/2010.  Anticoagulation instructions were given to patient.  Results were reviewed/authorized by Danella Penton, RN.  She was notified by Danella Penton, RN.         Prior Anticoagulation Instructions: Take 1 tablet (2.5 mg) daily until Thursday. Then proceed with how your doctor has told you to hold your coumadin.  INR 3.6  Current Anticoagulation  Instructions: INR 2.4 Continue taking 2 tablets every day, except take 1 tablet on Tuesdays and Thursdays. Recheck in 4 weeks.

## 2010-09-25 NOTE — Letter (Signed)
Summary: [facility] Addendum to Previous Note   [facility] Addendum to Previous Note   Imported By: Sallee Provencal 09/17/2010 15:47:09  _____________________________________________________________________  External Attachment:    Type:   Image     Comment:   External Document

## 2010-09-25 NOTE — Progress Notes (Signed)
Summary: c/o afib  Phone Note Call from Patient Call back at Home Phone [phone] P PH     Caller: Patient Reason for Call: Talk to Nurse Summary of Call: c/o afib for 4 weeks. no other side effect.  Initial call taken by: Neil Crouch,  September 19, 2010 2:58 PM  Follow-up for Phone Call        SPOKE WITH PT  CONT TO REMAIN IN AFIB AT A RATE AROUND 32  STATES FEELS FINE CANNOT TELL IS OUT OF RHTHYM ONLY WHEN CHECKS HR . DOES NOT WANT TO DO CARDIOVERSION IS THERE ANOTEHR OPTION? INR CHECKED TODAY WAS 2.4 . Follow-up by: Devra Dopp, LPN,  September 19, 3702 4:37 PM  Additional Follow-up for Phone Call Additional follow up Details #1::        if she feels fine than we needent doanything else thanks  o/w needs followup Additional Follow-up by: Nikki Dom, MD, [facility],  September 19, 2010 6:38 PM

## 2010-09-27 ENCOUNTER — Telehealth: Payer: Self-pay | Admitting: Internal Medicine

## 2010-10-02 NOTE — Progress Notes (Signed)
Summary: pt doing follow up call  Phone Note Call from Patient Call back at Home Phone [phone]   Caller: Patient Reason for Call: Talk to Nurse, Talk to Doctor Summary of Call: pt still has not heard anything after she talk to you last week so she is following up Initial call taken by: Shelda Pal,  September 27, 2010 12:19 PM  Follow-up for Phone Call        Phone Call Completed PER PT FEELS GOOD MAJORITY OF THE TIME HAS ON [place] FELT SOB  WHEN NOTES ELEVATD HR HAD PERSCRIPTION  FOR PROPRANOLOL  STATED NEEDED NEW SCRIPT ONE SENT VIA EMR TO [facility] PER PT APPT MADE OFR 10/30/10 WITH DR Caryl Comes IS DUE FOR 6 MO AT HTAT TIME Follow-up by: Devra Dopp, LPN,  September 27, 6757 9:35 AM  Additional Follow-up for Phone Call Additional follow up Details #1::        ok Additional Follow-up by: Nikki Dom, MD, [facility],  September 28, 2010 9:58 PM    New/Updated Medications: PROPRANOLOL HCL 10 MG TABS (PROPRANOLOL HCL) 1 once daily  as needed Prescriptions: PROPRANOLOL HCL 10 MG TABS (PROPRANOLOL HCL) 1 once daily  as needed  #30 x 2   Entered by:   Devra Dopp, LPN   Authorized by:   Nikki Dom, MD, [facility]   Signed by:   Devra Dopp, LPN on 16/38/4665   Method used:   Electronically to        [facility] ([facility])       [address] [place], [place]  [address]       Ph: [phone] or [phone]       Fax: [phone]   RxID:   [id number]

## 2010-10-08 ENCOUNTER — Telehealth: Payer: Self-pay | Admitting: Internal Medicine

## 2010-10-08 NOTE — Telephone Encounter (Signed)
Pt calling re setting up cardioversion if can't reach try [phone]

## 2010-10-08 NOTE — Telephone Encounter (Signed)
Pt returning your call

## 2010-10-08 NOTE — Telephone Encounter (Signed)
UNABLE TO LEAVE MESSAGE WILL TRY AGAIN TOMORROW.

## 2010-10-09 NOTE — Telephone Encounter (Signed)
Per pt is starting to feel bad  with afib per dr Caryl Comes needs f/u appt  Pt has f/u on 10/30/10.  Will keep appt and discuss tx plan at that visit pt verbalized  Understanding.

## 2010-10-15 ENCOUNTER — Ambulatory Visit (INDEPENDENT_AMBULATORY_CARE_PROVIDER_SITE_OTHER): Payer: Medicare Other | Admitting: *Deleted

## 2010-10-15 DIAGNOSIS — I4891 Unspecified atrial fibrillation: Secondary | ICD-10-CM

## 2010-10-15 DIAGNOSIS — Z7901 Long term (current) use of anticoagulants: Secondary | ICD-10-CM | POA: Insufficient documentation

## 2010-10-15 LAB — POCT INR: INR: 3.7

## 2010-10-15 NOTE — Patient Instructions (Signed)
Skip today's dosage of Coumadin, then resume same dosage 2 tablets daily except 1 tablet on Tuesdays and Thursdays.  Recheck in 3 weeks.

## 2010-10-30 ENCOUNTER — Encounter: Payer: Self-pay | Admitting: Internal Medicine

## 2010-10-30 ENCOUNTER — Ambulatory Visit (INDEPENDENT_AMBULATORY_CARE_PROVIDER_SITE_OTHER): Payer: Medicare Other | Admitting: Internal Medicine

## 2010-10-30 VITALS — BP 135/87 | HR 87 | Ht 66.0 in | Wt 186.0 lb

## 2010-10-30 DIAGNOSIS — I4891 Unspecified atrial fibrillation: Secondary | ICD-10-CM

## 2010-10-30 MED ORDER — ATENOLOL 50 MG PO TABS: ORAL_TABLET | ORAL | Status: DC

## 2010-10-30 MED ORDER — PROPRANOLOL HCL ER 120 MG PO CP24: ORAL_CAPSULE | ORAL | Status: DC

## 2010-10-30 MED ORDER — METOPROLOL SUCCINATE ER 50 MG PO TB24: ORAL_TABLET | ORAL | Status: DC

## 2010-10-30 NOTE — Patient Instructions (Addendum)
Your physician recommends that you schedule a follow-up appointment in: 6 weeks with DR [place] physician has recommended you make the following change in your medication: 3 NEW SCRIPTS TRY EACH MED 1 AT A TIME  FOR 2 WEEKS AND CHOOSE THE ONE THAT WORKS BEST FOR  S/S CALL OFFICE AND WILL GIVE MORE REFILLS ON THE ONE THAT WORKS BEST.

## 2010-10-30 NOTE — Progress Notes (Signed)
  HPI  [name] [name] is a 77 y.o. female seen in followup for paroxysmal atrial fibrillation which has been persistent since mid February. She is on which he thinks about 80% of the time and this is rather new. She does find however that she has dyspnea on exertion with rapid palpitations. She has had no peripheral edema.  She notes that she is very anxious about the assistance of atrial fibrillation and its attendant risk of stroke as well as the risks associated with discontinuing her anticoagulation in the event of surgery.  No past medical history on file.  No past surgical history on file.  Current Outpatient Prescriptions  Medication Sig Dispense Refill  . acebutolol (SECTRAL) 400 MG capsule Take 400 mg by mouth 2 (two) times daily.        . Calcium Carbonate-Vitamin D (CALCIUM + D) 600-200 MG-UNIT TABS Take by mouth. 1 BID       . Cholecalciferol (VITAMIN D3) 1000 UNITS tablet Take 1,000 Units by mouth daily.        . multivitamin (THERAGRAN) per tablet Take 1 tablet by mouth daily.        . propranolol (INDERAL) 10 MG tablet Take 10 mg by mouth 3 (three) times daily. AS DIRECTED       . valsartan-hydrochlorothiazide (DIOVAN-HCT) 320-25 MG per tablet Take 1 tablet by mouth daily.        Marland Kitchen warfarin (COUMADIN) 2.5 MG tablet Take by mouth as directed.          Allergies  Allergen Reactions  . Codeine     REACTION: feel funny  . Penicillins     REACTION: anaphylaxis  . Statins     REACTION: elevated LFT's  . Tylenol (Acetaminophen)     Review of Systems negative except from HPI and PMH  Physical Exam Well developed and well nourished in no acute distress HENT normal E scleral and icterus clear Neck Supple JVP flat; carotids brisk and full Mild kyphosis Clear to ausculation Irregularly irregular rate and rhythm without murmursSoft with active bowel sounds No clubbing cyanosis and edema Alert and oriented, grossly normal motor and sensory function Skin Warm and  Dry  ECG atrial fibrillation at 94 beats per minute intervals-/0.09/0.35  Assessment and  Plan

## 2010-10-30 NOTE — Assessment & Plan Note (Signed)
The bradycardia is a concern as noted previously.

## 2010-10-30 NOTE — Assessment & Plan Note (Signed)
The patient has atrial fibrillation that is persistent. Right now it is only moderately symptomatic. We had a lengthy greater than 25 minute discussion regarding alternatives including antiarrhythmic therapy and the potential for life-threatening proarrhythmia. She is disinclined at this time to pursue that. Hence we will try a rate controlling experiment for about 6 weeks. We have discussed the side effects of the beta blockers. We will prescribe atenolol 50, metoprolol succinate 50, and Inderal LA 120. We will see if she can tolerate one of these and if so whether it is successful in reducing her symptoms related to rapid rates. She has had bradycardia in sinus rhythm and we have not used these for her paroxysmal state.

## 2010-11-01 NOTE — Progress Notes (Signed)
Addended by: Fredia Beets on: 11/01/2010 12:03 PM   Modules accepted: Orders

## 2010-11-05 ENCOUNTER — Ambulatory Visit (INDEPENDENT_AMBULATORY_CARE_PROVIDER_SITE_OTHER): Payer: Medicare Other | Admitting: *Deleted

## 2010-11-05 DIAGNOSIS — I4891 Unspecified atrial fibrillation: Secondary | ICD-10-CM

## 2010-11-05 LAB — POCT INR: INR: 4

## 2010-11-19 ENCOUNTER — Ambulatory Visit (INDEPENDENT_AMBULATORY_CARE_PROVIDER_SITE_OTHER): Payer: Medicare Other | Admitting: *Deleted

## 2010-11-19 DIAGNOSIS — I4891 Unspecified atrial fibrillation: Secondary | ICD-10-CM

## 2010-11-19 LAB — POCT INR: INR: 3.3

## 2010-11-27 NOTE — Assessment & Plan Note (Signed)
[facility]                                 ON-CALL NOTE   NAME:[name] [name], [name] [name]                        MRN:          [id number]  DATE:12/04/2006                            DOB:          [birth year]    PRIMARY CARDIOLOGIST:  [name], MD, [facility]   [name] called because she is scheduled to have a heart  catheterization in the morning and had had some problems with abdominal  pain that was felt secondary to a fatty liver. She stated that her liver  enzymes had also been abnormal. She stated that she had felt bad on the  17th and 18th and in fact had been to the emergency room, but was  currently feeling much better and felt like her condition was  approximately 85-90% of normal. Of note, her liver enzymes were checked  on Nov 29, 2006 and her SGOT was 357 with an SGPT of 345. At that time,  Her BUN and creatinine were within normal limits at 11 and 0.89. Her  potassium was slightly low at 3.2 and this will be rechecked in the  morning. It is supplemented on a p.r.n. basis.   I told [name] that if she felt like she did not feel well enough for  the procedure then I would happy to cancel it for her. She also relayed  that she was having some anxiety about the procedure and felt like she  should get it over with as soon as possible. I advised her that she  might need to lie on the table for as long as six hours after the  procedure and she would need to be sure she could lie very still during  the procedure and she stated she thought she could do both of those  things. [name] stated that she was not sure and wanted to discuss it  with somebody. We did discuss it and she decided that she probably did  feel well enough to have the procedure done and wanted to go ahead and  get it out of the way. She stated that she would come in for the  procedure as scheduled on Dec 05, 2006.      Rosaria Ferries, PA-C  Electronically Signed      Denice Bors. Stanford Breed, MD, [facility]  Electronically Signed   RB/MedQ  DD: 12/04/2006  DT: 12/04/2006  Job #: [id number]

## 2010-11-27 NOTE — Cardiovascular Report (Signed)
NAMEOREAN, GIARRATANO NO.:  [id number]   MEDICAL RECORD NO.:  [id number]          PATIENT TYPE:  OIB   LOCATION:  1963                         FACILITY:  [facility]   PHYSICIAN:  Juanda Bond. Burt Knack, MD  DATE OF BIRTH:  1931/06/20   DATE OF PROCEDURE:  12/05/2006  DATE OF DISCHARGE:                            CARDIAC CATHETERIZATION   PROCEDURE:  Left heart catheterization, selective coronary angiography,  left ventricular angiography.   INDICATIONS:  Ms. Batt is a 77 year old woman who was recently seen by  Dr. Caryl Comes and presented there with chest pain.  She has atrial  fibrillation and a family history of coronary artery disease.  She was  referred for diagnostic catheterization in the setting of her risk  factors, need for intermittent antiarrhythmic use, and chest pain  syndrome.  Of note, since she has seeing Dr. Caryl Comes, she has developed  epigastric and right upper quadrant pain.  She has been evaluated in the  emergency room as well as by a gastroenterologist and has been diagnosed  with fatty liver.  Her transaminases are elevated.  She has discontinued  her statin and is scheduled to have recheck LFTs in the near future.  Her abdominal pain has improved, and she is clinically stable at this  time.  We elected to proceed with her diagnostic catheterization.   Risks and indications of the procedure were explained to the patient in  detail.  Informed consent was obtained.  The right groin was prepped,  draped, and anesthetized with 1% lidocaine.  Using the modified  Seldinger technique, a 4-French sheath was placed in the right femoral  artery without difficulty.  Multiple views of the left and right  coronary arteries were taken with standard catheters.  Following  selective coronary angiography, a 4-French angled pigtail catheter was  inserted into the left ventricle, and pressures were recorded.  A left  ventriculogram was performed.  Pullback across the  aortic valve was  done.  All catheter exchanges were performed over a guidewire.  There  were no immediate complications.   FINDINGS:  Aortic pressure 158/58 with mean of 96, left ventricular  pressure 157/5 with an end-diastolic pressure of 11.   Coronary angiography:  The left mainstem is angiographically normal.  It  bifurcates into the LAD and left circumflex.  The LAD is a medium-size  vessel that courses down and reaches the left ventricular apex.  There  is a large first diagonal branch and a medium-size second diagonal  branch.  There is no significant angiographic disease throughout the  LAD.  The mid and distal portions of the LAD are small.   The left circumflex is medium size.  It provides a large first OM  branch, and beyond that, the remaining portions of the AV groove  circumflex and the mid and distal segments are small.  The OM branch has  a nonobstructive plaque in its proximal aspect of about 20%.   The right coronary artery is dominant.  It is a medium caliber vessel  throughout its proximal mid and distal portions.  There is 20% to  30%  plaque in the proximal portion of the right coronary artery that is  clearly nonobstructive.  The remaining portions of the vessel are  angiographically normal.  It terminates in a PDA branch and posterior AV  segment that supplies two right posterolateral branches.   Left ventricular function assessed by 30-degree RAO ventriculography  showed normal wall motion with normal left ventricular ejection fraction  of 60% with no mitral regurgitation.   ASSESSMENT:  1. Angiographically normal LAD.  2. Nonobstructive plaque in the left circumflex.  3. Nonobstructive plaque in the right coronary artery.  4. Normal left ventricular function.   PLAN:  Ms. Kocsis has minor nonobstructive coronary artery disease and  is an excellent candidate for for medical therapy.  She has normal left  ventricular function as well.      Juanda Bond. Burt Knack, MD  Electronically Signed     MDC/MEDQ  D:  12/05/2006  T:  12/05/2006  Job:  [id number]   cc:   [name], MD, [facility]  Nelda Severe. Juventino Slovak, M.D.

## 2010-11-27 NOTE — Assessment & Plan Note (Signed)
[facility]                         ELECTROPHYSIOLOGY OFFICE NOTE   NAME:Malinoski, DERENDA GIDDINGS                      MRN:          [id number]  DATE:12/03/2007                            DOB:          30-May-1931    [name] [name] is seen in followup for atrial fibrillation which remains  paroxysmal.  The spells are occurring every couple of weeks.  They last  anywhere from minutes to a day and a half or so.  Many of these episodes  are largely now relatively asymptomatic.  Others when they awaken her  from sleep are more problematic.  She is taking the Inderal 10 mg as  needed.   She also describes recurrent substernal chest discomfort.  It  is  actually rather superficial and lasts but seconds.   She has chronic ankle edema, but has been no worse for some time.   MEDICATIONS:  1. Prednisone.  2. Atacand/HCT 32/12.5.  3. Acebutolol 200 b.i.d.  4. Coumadin.  5. Propranolol 10 p.r.n.   PHYSICAL EXAMINATION:  VITAL SIGNS:  Her weight today was 184, which is  stable.  Her blood pressure is 147/79, her pulse was 58.  LUNGS:  Clear.  HEART:  Sounds were regular, without murmurs or gallops.  ABDOMEN:  Soft.  EXTREMITIES:  Had no edema.  SKIN:  Warm and dry.   Electrocardiogram dated today demonstrated sinus rhythm at 54, with  intervals of 0.16/0.08/0.46.   IMPRESSION:  Paroxysmal atrial fibrillation - stable.  I told her to  take the Inderal up the 2 pills q.6 h.  Her blood pressures may need  augmented therapy.  I will defer this to Dr. Juventino Slovak.   I think her chest pains are noncardiac   I will see her again in 6 months' time.     [name], MD, [facility]  Electronically Signed    SCK/MedQ  DD: 12/03/2007  DT: 12/03/2007  Job #: [id number]   cc:   Nelda Severe. Juventino Slovak, M.D.  [place], M.D.

## 2010-11-27 NOTE — Assessment & Plan Note (Signed)
[facility] OFFICE NOTE   NAME:[name], [name] ROWLANDS                      MRN:          [id number]  DATE:11/28/2006                            DOB:          October 29, 1930    [name] [name] comes in today.  She has paroxysmal atrial fibrillation.  She is having more spells.  She has had a dozen in the last six months,  lasting from 17 to 41 hours.  There is some variability in the symptoms,  which she thinks tracks with the rate, the palpable rate of which is  between 70 and 90, and undoubtedly the electrical rate is somewhat  faster than that.   She also describes a new-onset left parasternal chest discomfort without  radiation.  It is unassociated with exertion.   Her cardiac risk factors are notable for dyslipidemia, hypertension, and  a very strong family history.   She also complains of dizziness that occurs with prolonged standing.  She notes that she has had a little bit of peripheral edema of late,  though this is more recently resolved.   CURRENT MEDICATIONS INCLUDE:  1. Atacand/HCT 32/12.5.  2. Fosamax.  3. Acebutolol 200 b.i.d.  4. Coumadin.  5. Vytorin 10/20.  6. AcipHex.  7. Prednisone 4 mg.   ON EXAMINATION:  Her blood pressure today was 134/63 with a pulse of 56.  LUNGS:  Clear.  HEART SOUNDS:  Regular with an S4.  EXTREMITIES:  Without edema.   Electrocardiogram demonstrates sinus rhythm at 56 with interval of  0.15/0.08/0.45.  The axis was 14 degrees.  She has long-standing ST-  segment depression and scooping in V4 to V6.  She now has it also  evident in 2, 3 and F.  It was previously present also in 1 and L.   IMPRESSION:  1. Chest pain, atypical, with multiple cardiac risk factors, as noted      above.  2. Atrial fibrillation, paroxysmal, with increasing symptoms,      presumably related to rate, both increased and recurrent anxiety.  3. Peripheral edema.  4. Light-headedness,  question orthostatic.   I have discussed with [name] [name] the abnormal electrocardiogram and the  potential role of using long-term antiarrhythmic drug therapy.  Given  the progressive abnormalities of the electrocardiogram and her strong  family history, we have elected to proceed with catheterization.  I have  reviewed with her the potential benefits, as well as the potential risks  of that, including, but not limited to death and vascular injury.  She  understands these risks and is willing to proceed.   In addition, we will check her orthostatics today.  I have reviewed with  her a salt-lowering diet.  We have also given her a prescription today  for Inderal 10 to take every 6-8 hours for symptomatic atrial  fibrillation.   We will see her again in six months.     [name] Sprang, MD, [facility]  Electronically Signed    SCK/MedQ  DD: 11/28/2006  DT: 11/28/2006  Job #: [id number]   cc:   [name] [name]. [name] [name], M.D.

## 2010-11-27 NOTE — Assessment & Plan Note (Signed)
[facility]                         ELECTROPHYSIOLOGY OFFICE NOTE   NAME:[name] [name], [name] [name]                      MRN:          [id number]  DATE:05/27/2007                            DOB:          [birth year]    The patient continues to have paroxysms of atrial fibrillation.  They  have been occurring about once a week over the last month or so lasting  about 24 hours.  They are associated rarely with a rapid ventricular  response.  This can be quite disruptive.  Mostly she tolerates them  pretty well and is able to continue.   She has had problems with bradycardia.  She takes acebutolol and  Coumadin.  She also takes Atacand hydrochlorothiazide and prednisone for  her polymyalgia rheumatica.   PHYSICAL EXAMINATION:  VITAL SIGNS:  Blood pressure today was 152/71,  pulse 55.  LUNGS:  Clear.  HEART: Sounds were regular.  EXTREMITIES:  Without edema.  SKIN:  Warm and dry.   Electrocardiogram dated today demonstrated a sinus rhythm at 55 with  intervals of 0.15/0.08/0.44.   IMPRESSION:  1. Atrial fibrillation, paroxysmal.  2. Tendency toward bradycardia.  3. Hypertension - elevated today.  4. Polymyalgia rheumatica on steroids.  5. Atypical chest pain.  6. Recent episode of hepatitis question mechanism.   The patient's atrial fibrillation is more frequent.  It is rapid when it  occurs.  We have discussed in the past entering a drug therapy; she is  not inclined to pursue that at this time.   I would concur.  I have given her a prescription today for Inderal 10 mg  to take with an episode of atrial fibrillation to see if that does not  help ameliorate her symptoms which are attributable at least in part to  a rapid ventricular response.  Given her bradycardia, she will only take  it with her AF.   She will be following up with Dr. Juventino Slovak and Dr. Marveen Reeks.   As it relates to chest pain, she has no obstructive coronary artery  disease.  These  episodes are quite brief and are unrelated almost  certainly to her heart.   I will see her again in six months' time.     [name], MD, [facility]  Electronically Signed    SCK/MedQ  DD: 05/27/2007  DT: 05/27/2007  Job #: [id number]   cc:   Nelda Severe. Juventino Slovak, M.D.  Anson [place], M.D.

## 2010-11-27 NOTE — Assessment & Plan Note (Signed)
[facility]                         ELECTROPHYSIOLOGY OFFICE NOTE   NAME:[name], [name] GLASPY                      MRN:          [id number]  DATE:05/23/2008                            DOB:          1931/04/28    [name] [name] comes in.  She continues to have complaints of paroxysmal  atrial fibrillation, although it does not bother her tremendously.  She  does have some fatigue and dyspnea with it.  She takes Inderal 20 mg q.6  while she is in atrial fibrillation episodes, they have been lasting 24-  48 hours.  She thinks they have shorter duration since she takes the  Inderal with it.   PHYSICAL EXAMINATION:  VITAL SIGNS:  Her blood pressure is 142/90, her  pulse is 75, her weight is 186, which is stable.  LUNGS:  Clear.  HEART:  Heart sounds were regular.  EXTREMITIES:  Without edema.   IMPRESSION:  1. Paroxysmal atrial fibrillation.  2. Propensity towards bradycardia.   [name] [name] is doing fine.  We will continue on her current medications,  which include the Coumadin and the acebutolol as well as Atacand HCT and  we will see her again in 6 months' time.     [name] Sprang, MD, [facility]  Electronically Signed    SCK/MedQ  DD: 05/23/2008  DT: 05/24/2008  Job #: [id number]   cc:   [name] [name], M.D.

## 2010-11-27 NOTE — Assessment & Plan Note (Signed)
[facility]                            CARDIOLOGY OFFICE NOTE   NAME:[name], [name] TELLO                      MRN:          [id number]  DATE:12/24/2006                            DOB:          Apr 27, 1931    PRIMARY CARDIOLOGIST:  [name] [name], M.D.   [name] is a 77 year old white female, who underwent left heart  catheterization on Dec 05, 2006, for chest pain.  This was performed by  Dr. Burt [name] and she was found to have minor nonobstructive coronary  artery disease and normal LV function, medical therapy recommended.  She  still says she has twinging chest pain that is more like a surface pain,  but is not very bothersome.  Her main problem is her liver.  She was  recently diagnosed with fatty liver with increased LFTs and was in the  emergency room over the weekend with more abdominal pain.  She was  referred to Dr. Penelope [name] and they stopped her Vytorin and Fosamax and are  consulting with [facility].  She says she may have to have a liver biopsy.  Her Coumadin has been on hold because her INR was so high.  She did have  an episode of atrial fibrillation yesterday.  She said it was controlled  rate.  Sometimes she has fast atrial fibrillation, but this was  controlled and started at 3 a.m. and lasted until 2 in the afternoon.  They drew her INR yesterday at Dr. Estell [name] office because she needed  other blood work and the Coumadin clinic here is aware that she is on  hold.  She has not heard the results of her INR.   CURRENT MEDICATIONS:  1. Atacand/HCTZ 32/12.5 mg daily.  2. Acebutolol 200 mg b.i.d.  3. Prednisone 4 mg daily.  4. AcipHex 20 mg every other day.   Her Fosamax, Coumadin, Vytorin, calcium and multivitamin are all on  hold.   PHYSICAL EXAM:  This is a pleasant, 77 year old white female, in no  acute distress.  Blood pressure 99/58, pulse 52, weight 183.  NECK:  Without JVD, HJR, bruit or thyroid enlargement.  LUNGS:  Clear, anterior,  posterior and lateral.  HEART:  Regular rate and rhythm at 52 beats per minute.  Normal S1 and  S2.  No murmur, rub, bruit, thrill or heave noted.  ABDOMEN:  Soft, without organomegaly, masses, lesions or abnormal  tenderness.  EXTREMITIES:  Without cyanosis, clubbing or edema.  She has good distal  pulses.  Right groin without hematoma or hemorrhage.   IMPRESSION:  1. Chest pain, felt to be noncardiac.  Nonobstructive coronary artery      disease and normal LV function on catheterization on Dec 05, 2006.  2. Abdominal pain with diagnosis of fatty liver, increased LFTs.      Question drug-induced.  Currently being worked up by Dr. Penelope [name].  3. PMR on steroids.  4. Paroxysmal atrial fibrillation.  5. Coumadin therapy, on hold secondary to increased INR and LFTs.  6. History of peripheral edema.  7. Dyslipidemia.  8. Hypertension.  33. Very strong family history of coronary artery  disease.   PLAN AT THIS TIME:  Patient is stable from a cardiac standpoint.  She  will closely monitor her INRs through our Coumadin clinic and Dr. Penelope [name]  and she will see Dr. Caryl [name] back in six months.      [name] Barrios, PA-C  Electronically Signed      Minus Breeding, MD, [facility]  Electronically Signed   ML/MedQ  DD: 12/24/2006  DT: 12/24/2006  Job #: [id number]

## 2010-11-27 NOTE — Assessment & Plan Note (Signed)
[facility]                         ELECTROPHYSIOLOGY OFFICE NOTE   NAME:[name] [name], [name] [name]                      MRN:          [id number]  DATE:02/25/2008                            DOB:          [birth year]    HISTORY OF PRESENT ILLNESS:  [name] [name] was seen at the hospital at the  end of July because of atrial fibrillation with a rapid ventricular  response.  She has had a propensity towards bradycardia and we have  given her at the last visit Inderal to take as needed.  She was taking  10 mg p.r.n., would sometimes will take 2.   These episodes were specifically lasting a day or so, they occur once a  week and may be recently a little more frequently.  They remain quite  discombobulated.  We have discussed treatment options in the past  including pacers and antiarrhythmic drugs and the risks in the range of  1-2 per 1000 are excessive in her mind.   CURRENT MEDICATIONS:  1. Atacand HCT.  2. Acebutolol  200 b.i.d.  3. Coumadin.   PHYSICAL EXAMINATION:  VITAL SIGNS:  Her blood pressure 122/72, her  pulse is 51.  Her weight was 183.  LUNGS:  Clear.  HEART:  Her heart sounds were regular.  EXTREMITIES: Without edema.  GENERAL:  She was in no acute distress.  SKIN:  Warm and dry.   Electrocardiogram demonstrated sinus rhythm at 51 with intervals of  0.15/0.08/0.47, the axis  was 45 degrees.   IMPRESSION:  1. Paroxysmal atrial fibrillation with a rapid ventricular response.  2. Bradycardia.  3. Hypertension.  4. Coumadin for the above.   [name] [name] would like to continue on her current course.  She does not  want to take any medications or procedures that have any risks at this  point.  I did till her she could take her Inderal up to 30 mg q. 6h.   We will see her again as scheduled in November.     [name], MD, [facility]  Electronically Signed    SCK/MedQ  DD: 02/25/2008  DT: 02/26/2008  Job #: [id number]

## 2010-11-30 NOTE — Assessment & Plan Note (Signed)
[facility]                           ELECTROPHYSIOLOGY OFFICE NOTE   NAME:[name], [name] SCARPELLI                      MRN:          [id number]  DATE:05/23/2006                            DOB:          [birth year]    [name] [name] is in today.  She has paroxysmal atrial fibrillation which she  continues to have, though these symptoms are becoming less oppressive to  her.  She has also had to go back on prednisone because of her polymyalgia.  She is tolerating her medications well.  Her blood pressure today was  140/76, pulse was 64, lungs were clear.  Heart sounds were regular.  Extremities were without edema.   Electrocardiogram demonstrates sinus rhythm at 64 with intervals at  .15/.08/.45.   IMPRESSION:  1. Paroxysmal atrial fibrillation.  2. Hypertension.  3. Polymyalgia rheumatica.   [name] [name] is doing fine.  She continues to carry a prescription for  flecainide to be given in a 300 mg dose in the event of a prolonged  recurrence.  We set an arbitrary time of about 2 days as most of her spells  are less than that.   I have encouraged her to just keep going on with her life and to not to be  too put out by her episodes and she is actually doing much better in this  regard.  We will see her back in one year's time.     [name], MD, [facility]  Electronically Signed    SCK/MedQ  DD: 05/23/2006  DT: 05/23/2006  Job #: [id number]   cc:   Nelda Severe. Juventino Slovak, M.D.

## 2010-12-03 ENCOUNTER — Ambulatory Visit (INDEPENDENT_AMBULATORY_CARE_PROVIDER_SITE_OTHER): Payer: Medicare Other | Admitting: *Deleted

## 2010-12-03 DIAGNOSIS — I4891 Unspecified atrial fibrillation: Secondary | ICD-10-CM

## 2010-12-06 ENCOUNTER — Telehealth: Payer: Self-pay | Admitting: Internal Medicine

## 2010-12-06 DIAGNOSIS — I4891 Unspecified atrial fibrillation: Secondary | ICD-10-CM

## 2010-12-06 MED ORDER — PROPRANOLOL HCL ER 120 MG PO CP24: 120.0000 mg | ORAL_CAPSULE | Freq: Every day | ORAL | Status: DC

## 2010-12-06 NOTE — Telephone Encounter (Signed)
Called patient back. She advised me that she liked the inderal better then the other 2 meds that Dr.Klein gave her. She needs more medication sent to [facility]. Will send in Inderal  LA 120. Has ROV with Dr. Caryl Comes in the beginning of June.

## 2010-12-06 NOTE — Telephone Encounter (Signed)
Dr Caryl Comes gave Pt 2 weeks worth of three different meds finished propanilol and wants to know if she needs to refill it until she see's him 6-4? What to do?  uses [facility] pt cell# [phone]

## 2010-12-07 ENCOUNTER — Encounter: Payer: Self-pay | Admitting: Internal Medicine

## 2010-12-07 ENCOUNTER — Telehealth: Payer: Self-pay | Admitting: Internal Medicine

## 2010-12-07 NOTE — Telephone Encounter (Signed)
Needs to talk to you about some medications.  This was mentioned yesterday during phone call.  She wants to know if she can go back on the old medicine.  Please call her.

## 2010-12-07 NOTE — Telephone Encounter (Signed)
Patient wanted to go back to take Acebutolol 200 mg twice a day. She was taken this medication prior starting Inderal. Pt.  though Inderal was making her heart rate to go up.  I let pt. Know that  inderal will bring the HR down instead. Patient changed her mind . She will stay on Inderal and will talk with MD on her next office visit.

## 2010-12-17 ENCOUNTER — Encounter: Payer: Self-pay | Admitting: *Deleted

## 2010-12-17 ENCOUNTER — Ambulatory Visit (INDEPENDENT_AMBULATORY_CARE_PROVIDER_SITE_OTHER): Payer: Medicare Other | Admitting: Internal Medicine

## 2010-12-17 ENCOUNTER — Telehealth: Payer: Self-pay | Admitting: Internal Medicine

## 2010-12-17 ENCOUNTER — Encounter: Payer: Self-pay | Admitting: Internal Medicine

## 2010-12-17 DIAGNOSIS — I10 Essential (primary) hypertension: Secondary | ICD-10-CM

## 2010-12-17 DIAGNOSIS — I4891 Unspecified atrial fibrillation: Secondary | ICD-10-CM

## 2010-12-17 DIAGNOSIS — I495 Sick sinus syndrome: Secondary | ICD-10-CM

## 2010-12-17 MED ORDER — FLECAINIDE ACETATE 100 MG PO TABS: 100.0000 mg | ORAL_TABLET | Freq: Two times a day (BID) | ORAL | Status: DC

## 2010-12-17 NOTE — Telephone Encounter (Signed)
Pt was seen today- pt on new medication . the printed out version is different from what  dr. Caryl Comes had told pt. pls clarify

## 2010-12-17 NOTE — Assessment & Plan Note (Signed)
Her blood pressure is elevated today; however, will address his following cardioversion.

## 2010-12-17 NOTE — Telephone Encounter (Signed)
I spoke with the patient. She called to clarify that she is to start flecainide on 12/18/10- this was on her patient instructions, but her DCCV letter stated to start on 12/20/10. I explained that was an error, and she is to start flecainide on 12/18/10. She voices understanding.

## 2010-12-17 NOTE — Assessment & Plan Note (Signed)
The patient has persistent atrial fibrillation with relatively reasonably controlled ventricular response. She has persistent symptoms of exercise intolerance and as such would like to pursue antiarrhythmic therapy and cardioversion. This comes after many months of discussion. We reviewed again today with her daughter present; she is a CCU nurse.  Given her propensity towards bradycardia we will use of flecainide. Will have her hold her acebutolol    for 48 hours prior to cardioversion. She will need a post cardioversion treadmill.  In anticipation of this we will stop her propranolol and resume her  acebutolol

## 2010-12-17 NOTE — Progress Notes (Signed)
HPI  [name] [name] is a 77 y.o. female seen in followup for paroxysmal atrial fibrillation which had been persistent since mid February. When we saw her in April we decided to undertake a beta blocker trial atenolol 50, metoprolol succinate 50, and Inderal LA 120 to see if she can tolerate one of these and if so whether it is successful in reducing her symptoms related to rapid rates.  Unfortunately we have not been able to effect a significant improvement in her symptoms. Both she and her daughter (CCU nurse) think that her symptoms are consistently worse and attributable to her atrial fibrillation. She is interested now in considering antiarrhythmic therapy which she was not interested in considering before.  She has had a catheterization in 2008 demonstrating normal left ventricular function and nonobstructive coronary disease   She has had bradycardia in sinus rhythm and we have not used these for her paroxysmal state.    .  Past Medical History  Diagnosis Date  . PAF (paroxysmal atrial fibrillation)   . Bradycardia   . HTN (hypertension)   . CAD (coronary artery disease)   . NASH (nonalcoholic steatohepatitis)   . PMR (polymyalgia rheumatica)   . HLD (hyperlipidemia)     Past Surgical History  Procedure Date  . Umbilical hernia repair 7062'B  . Incision and drainage breast abscess 1973    Current Outpatient Prescriptions  Medication Sig Dispense Refill  . acebutolol (SECTRAL) 200 MG capsule Take 1 tablet by mouth Twice daily.      . Calcium Carbonate-Vitamin D (CALCIUM + D) 600-200 MG-UNIT TABS Take by mouth. 1 BID       . Cholecalciferol (VITAMIN D3) 1000 UNITS tablet Take 1,000 Units by mouth daily.        . multivitamin (THERAGRAN) per tablet Take 1 tablet by mouth daily.        . propranolol (INDERAL LA) 120 MG 24 hr capsule Take 1 capsule (120 mg total) by mouth daily.  30 capsule  11  . propranolol (INDERAL LA) 120 MG 24 hr capsule 1 EVERY DAY         .  valsartan-hydrochlorothiazide (DIOVAN-HCT) 320-25 MG per tablet Take 1 tablet by mouth daily.        Marland Kitchen warfarin (COUMADIN) 2.5 MG tablet Take by mouth as directed.        Marland Kitchen DISCONTD: propranolol (INDERAL LA) 120 MG 24 hr capsule 1 EVERY DAY FOR 14 DAYS  14 capsule  0  . DISCONTD: atenolol (TENORMIN) 50 MG tablet 1 EVERY DAY FOR 14 DAYS  14 tablet  0  . DISCONTD: metoprolol (TOPROL XL) 50 MG 24 hr tablet 1 EVERY DAY FOR 14 DAYS  14 tablet  0  . DISCONTD: propranolol (INDERAL) 10 MG tablet Take 10 mg by mouth 3 (three) times daily. AS DIRECTED         Allergies  Allergen Reactions  . Codeine     REACTION: feel funny  . Penicillins     REACTION: anaphylaxis  . Statins     REACTION: elevated LFT's  . Tylenol (Acetaminophen)     Review of Systems negative except from HPI and PMH  Physical Exam Well developed and well nourished in no acute distress HENT normal E scleral and icterus clear Neck Supple JVP flat; carotids brisk and full Clear to ausculation Irregularly irregular without murmurs or gallops Soft with active bowel sounds No clubbing cyanosis and edema Alert and oriented, grossly normal motor and sensory function Skin Warm  and Dry    Assessment and  Plan

## 2010-12-17 NOTE — Telephone Encounter (Signed)
I attempted to call the patient at her home #- busy x 3 attempts. I have tried her alternate #'s and left a message for her to call at both #"s.

## 2010-12-17 NOTE — Assessment & Plan Note (Signed)
As above.

## 2010-12-17 NOTE — Patient Instructions (Addendum)
Your physician has recommended you make the following change in your medication:  1) stop inderal 2) we will resume acebutolol after your cardioversion, but do not take any prior to the cardioversion. 3) start Flecainide 164m twice daily on 12/18/10.  Your physician has recommended that you have a Cardioversion (DCCV). Electrical Cardioversion uses a jolt of electricity to your heart either through paddles or wired patches attached to your chest. This is a controlled, usually prescheduled, procedure. Defibrillation is done under light anesthesia in the hospital, and you usually go home the day of the procedure. This is done to get your heart back into a normal rhythm. You are not awake for the procedure. Please see the instruction sheet given to you today.  Your physician recommends that you schedule a follow-up appointment in: 4 weeks.

## 2010-12-19 ENCOUNTER — Telehealth: Payer: Self-pay | Admitting: Internal Medicine

## 2010-12-19 NOTE — Telephone Encounter (Signed)
Spoke with pt, she took the third dose of flecainide and her heart rate is running 100-115. She feels fine with no SOB or other symptoms. She is to be at the hosp at 9am in the morning for cardioversion. Per dr Lovena Le, should be fine [name] [name]

## 2010-12-19 NOTE — Telephone Encounter (Signed)
Pt told to go off propanalol and start flecinide  before cardioversion heart rate seems to being going up since stopping yesterday

## 2010-12-20 ENCOUNTER — Ambulatory Visit (HOSPITAL_COMMUNITY)
Admission: RE | Admit: 2010-12-20 | Discharge: 2010-12-20 | Disposition: A | Discharge status: Home / Self Care | Payer: Medicare Other | Source: Ambulatory Visit | Attending: Internal Medicine | Admitting: Internal Medicine

## 2010-12-20 DIAGNOSIS — I10 Essential (primary) hypertension: Secondary | ICD-10-CM | POA: Insufficient documentation

## 2010-12-20 DIAGNOSIS — I4891 Unspecified atrial fibrillation: Secondary | ICD-10-CM | POA: Insufficient documentation

## 2010-12-20 DIAGNOSIS — I2510 Atherosclerotic heart disease of native coronary artery without angina pectoris: Secondary | ICD-10-CM | POA: Insufficient documentation

## 2010-12-20 LAB — PROTIME-INR
INR: 2.45 — ABNORMAL HIGH (ref 0.00–1.49)
Prothrombin Time: 26.7 seconds — ABNORMAL HIGH (ref 11.6–15.2)

## 2010-12-20 LAB — BASIC METABOLIC PANEL
BUN: 16 mg/dL (ref 6–23)
Creatinine, Ser: 0.85 mg/dL (ref 0.4–1.2)
GFR calc non Af Amer: >60 mL/min (ref >60)
Potassium: 3.4 mEq/L — ABNORMAL LOW (ref 3.5–5.1)

## 2010-12-20 LAB — CBC
Hemoglobin: 13.6 g/dL (ref 12.0–15.0)
MCHC: 33.3 g/dL (ref 30.0–36.0)
RBC: 4.37 MIL/uL (ref 3.87–5.11)

## 2010-12-24 ENCOUNTER — Ambulatory Visit (INDEPENDENT_AMBULATORY_CARE_PROVIDER_SITE_OTHER): Payer: Medicare Other | Admitting: *Deleted

## 2010-12-24 ENCOUNTER — Encounter: Payer: Medicare Other | Admitting: *Deleted

## 2010-12-24 DIAGNOSIS — I4891 Unspecified atrial fibrillation: Secondary | ICD-10-CM

## 2010-12-24 LAB — POCT INR: INR: 2.7

## 2010-12-26 ENCOUNTER — Telehealth: Payer: Self-pay | Admitting: Internal Medicine

## 2010-12-26 NOTE — Telephone Encounter (Signed)
I spoke with the patient. She states she has been feeling fatigue and tiredness to her legs since Sunday. She also states she has been woken up during the night breathing hard. She was cardioverted on 12/20/10. She states that 2 days after her DCCV that she felt a couple episodes of fluttering that lasted about 10 minutes. She thinks that her HR has been in the 60's and her bp has been ok. She wonders if this is related to flecainide. I explained this could be her medication, but she may also be out of rhythm. I reviewed this with Dr. Caryl Comes and he wants her to come tomorrow for an EKG. He also recommended that she could decrease her flecainide to 15m twice daily. I explained this to her and she verbalizes understanding. She will come in the morning for an EKG. If she is in rhythm, we will leave her on flecainide 559mtwice daily to see if her symptoms improve at the lower dose. She is agreeable.

## 2010-12-26 NOTE — Telephone Encounter (Signed)
C/o Sided effect from 1 of medication - flecinide 100 mg.

## 2010-12-27 ENCOUNTER — Ambulatory Visit (INDEPENDENT_AMBULATORY_CARE_PROVIDER_SITE_OTHER): Payer: Medicare Other | Admitting: Physician Assistant

## 2010-12-27 ENCOUNTER — Inpatient Hospital Stay (HOSPITAL_COMMUNITY): Payer: Medicare Other

## 2010-12-27 ENCOUNTER — Encounter: Payer: Self-pay | Admitting: Physician Assistant

## 2010-12-27 ENCOUNTER — Inpatient Hospital Stay (HOSPITAL_COMMUNITY)
Admission: AD | Admit: 2010-12-27 | Discharge: 2010-12-28 | DRG: 293 | Disposition: A | Discharge status: Home Health | Payer: Medicare Other | Source: Ambulatory Visit | Attending: Cardiology | Admitting: Cardiology

## 2010-12-27 VITALS — BP 178/115 | HR 66 | Resp 14 | Ht 66.0 in | Wt 193.0 lb

## 2010-12-27 DIAGNOSIS — I4891 Unspecified atrial fibrillation: Secondary | ICD-10-CM

## 2010-12-27 DIAGNOSIS — Z886 Allergy status to analgesic agent status: Secondary | ICD-10-CM

## 2010-12-27 DIAGNOSIS — I5033 Acute on chronic diastolic (congestive) heart failure: Secondary | ICD-10-CM

## 2010-12-27 DIAGNOSIS — Z888 Allergy status to other drugs, medicaments and biological substances status: Secondary | ICD-10-CM

## 2010-12-27 DIAGNOSIS — I5032 Chronic diastolic (congestive) heart failure: Secondary | ICD-10-CM | POA: Insufficient documentation

## 2010-12-27 DIAGNOSIS — I5031 Acute diastolic (congestive) heart failure: Principal | ICD-10-CM | POA: Diagnosis present

## 2010-12-27 DIAGNOSIS — Z7901 Long term (current) use of anticoagulants: Secondary | ICD-10-CM

## 2010-12-27 DIAGNOSIS — E785 Hyperlipidemia, unspecified: Secondary | ICD-10-CM | POA: Diagnosis present

## 2010-12-27 DIAGNOSIS — Z882 Allergy status to sulfonamides status: Secondary | ICD-10-CM

## 2010-12-27 DIAGNOSIS — I509 Heart failure, unspecified: Secondary | ICD-10-CM | POA: Diagnosis present

## 2010-12-27 DIAGNOSIS — I10 Essential (primary) hypertension: Secondary | ICD-10-CM | POA: Diagnosis present

## 2010-12-27 DIAGNOSIS — M353 Polymyalgia rheumatica: Secondary | ICD-10-CM | POA: Diagnosis present

## 2010-12-27 LAB — COMPREHENSIVE METABOLIC PANEL
Alkaline Phosphatase: 106 U/L (ref 39–117)
BUN: 16 mg/dL (ref 6–23)
CO2: 26 mEq/L (ref 19–32)
Chloride: 104 mEq/L (ref 96–112)
Creatinine, Ser: 0.72 mg/dL (ref 0.4–1.2)
GFR calc Af Amer: >60 mL/min (ref >60)
GFR calc non Af Amer: >60 mL/min (ref >60)
Glucose, Bld: 86 mg/dL (ref 70–99)
Potassium: 3.8 mEq/L (ref 3.5–5.1)
Total Bilirubin: 0.5 mg/dL (ref 0.3–1.2)

## 2010-12-27 LAB — TSH: TSH: 0.933 u[IU]/mL (ref 0.350–4.500)

## 2010-12-27 LAB — CBC
HCT: 38 % (ref 36.0–46.0)
MCHC: 34.2 g/dL (ref 30.0–36.0)
MCV: 92.7 fL (ref 78.0–100.0)
RDW: 14.5 % (ref 11.5–15.5)

## 2010-12-27 LAB — CARDIAC PANEL(CRET KIN+CKTOT+MB+TROPI)
CK, MB: 1.8 ng/mL (ref 0.3–4.0)
Total CK: 50 U/L (ref 7–177)
Troponin I: <0.3 ng/mL (ref <0.30)

## 2010-12-27 NOTE — Assessment & Plan Note (Signed)
She had normal LVF at cath in 2008.  Likely had diastolic dysfunction.  Flecainide may cause new onset CHF 1-10% of the time (26% of patients with previous CHF develop worsened symptoms).  She is not interested in taking any antiarrhythmic now.  Stop flecainide.  Long discussion regarding diuresis at home.  But, patient very nervious.  Will therefore admit for diuresis.  Stop HCTZ.  Start Lasix 40 mg IV bid. Daily K+ 20 mEq BID.  Check echo.

## 2010-12-27 NOTE — Progress Notes (Addendum)
History of Present Illness: Primary Electrophysiologist:  Dr. Virl Axe  [name] [name] is a 77 y.o. female with paroxysmal/persistent AFib on coumadin.  She had a cardiac cath in 2008 with pOM 20% and pRCA 20-30% with EF 60%.  She was seen recently by Dr. Caryl Comes with adjustments in rate controlling meds.  Then, it was decided to pursue antiarrhythmic therapy and DCCV.  She started on flecainide and had DCCV last week.  She came today for EKG.  She is still in NSR.  But, notes problems with dyspnea and swelling.  She was added on to my schedule.  She describes class 3 DOE.  She has slept in a recliner for years.  She notes PND.  No chest pain.  No syncope.  She notes significant swelling.  Weight up 7 pounds since last week.   No history of CHF.  Past Medical History  Diagnosis Date  . PAF (paroxysmal atrial fibrillation)   . Bradycardia   . HTN (hypertension)   . CAD (coronary artery disease)   . NASH (nonalcoholic steatohepatitis)   . PMR (polymyalgia rheumatica)   . HLD (hyperlipidemia)     Current Outpatient Prescriptions  Medication Sig Dispense Refill  . acebutolol (SECTRAL) 200 MG capsule Take 1 tablet by mouth Twice daily.      . Calcium Carbonate-Vitamin D (CALCIUM + D) 600-200 MG-UNIT TABS Take by mouth. 1 BID       . Cholecalciferol (VITAMIN D3) 1000 UNITS tablet Take 1,000 Units by mouth daily.        . flecainide (TAMBOCOR) 100 MG tablet Take 50 mg by mouth 2 (two) times daily.        . multivitamin (THERAGRAN) per tablet Take 1 tablet by mouth daily.        . potassium chloride (KLOR-CON) 10 MEQ CR tablet Take 10 mEq by mouth daily.        . valsartan-hydrochlorothiazide (DIOVAN-HCT) 320-25 MG per tablet Take 1 tablet by mouth daily.        Marland Kitchen warfarin (COUMADIN) 2.5 MG tablet Take by mouth as directed.        Marland Kitchen DISCONTD: flecainide (TAMBOCOR) 100 MG tablet Take 1 tablet (100 mg total) by mouth 2 (two) times daily.  60 tablet  11    Allergies: Allergies  Allergen  Reactions  . Codeine     REACTION: feel funny  . Penicillins     REACTION: anaphylaxis  . Statins     REACTION: elevated LFT's  . Tylenol (Acetaminophen)     Social Hx:  Non smoker.  Married.  ROS:  See HPI.  No fevers, chills, cough, melena, hematochezia.  All other systems reviewed and negative.   Vital Signs: BP 178/115  Pulse 66  Resp 14  Ht 5' 6"  (1.676 m)  Wt 193 lb (87.544 kg)  BMI 31.15 kg/m2 Repeat BP by me on right:  178/84  PHYSICAL EXAM: Well nourished, well developed, in no acute distress HEENT: normal Neck: + JVD to angle of jaw Endo: no thyromegaly Vascular:  No carotid bruits Cardiac:  normal S1, S2; RRR; no murmur; + S3 Lungs:  clear to auscultation bilaterally, no wheezing, rhonchi or rales Abd: soft, nontender, no hepatomegaly Ext: 2+ bilateral edema Skin: warm and dry Neuro:  CNs 2-12 intact, no focal abnormalities noted Psych:  Normal affect; anxious  EKG:  NSR, HR 66, 1st degree AV block, NSSTTW changes  ASSESSMENT AND PLAN: Patient seen and examined. It will be optimal to admit  her to treat CHF and stop flecainide.

## 2010-12-27 NOTE — Assessment & Plan Note (Signed)
In NSR for now.  As above, stop flecainide.

## 2010-12-28 DIAGNOSIS — I059 Rheumatic mitral valve disease, unspecified: Secondary | ICD-10-CM

## 2010-12-28 DIAGNOSIS — I5031 Acute diastolic (congestive) heart failure: Secondary | ICD-10-CM

## 2010-12-28 LAB — CARDIAC PANEL(CRET KIN+CKTOT+MB+TROPI)
CK, MB: 1.5 ng/mL (ref 0.3–4.0)
Relative Index: INVALID (ref 0.0–2.5)
Total CK: 44 U/L (ref 7–177)
Total CK: 35 U/L (ref 7–177)
Troponin I: <0.3 ng/mL (ref <0.30)

## 2010-12-28 LAB — BASIC METABOLIC PANEL
Calcium: 9.7 mg/dL (ref 8.4–10.5)
GFR calc Af Amer: >60 mL/min (ref >60)
GFR calc non Af Amer: >60 mL/min (ref >60)
Glucose, Bld: 99 mg/dL (ref 70–99)
Potassium: 3.6 mEq/L (ref 3.5–5.1)
Sodium: 140 mEq/L (ref 135–145)

## 2011-01-02 ENCOUNTER — Other Ambulatory Visit (INDEPENDENT_AMBULATORY_CARE_PROVIDER_SITE_OTHER): Payer: Medicare Other | Admitting: *Deleted

## 2011-01-02 DIAGNOSIS — I5032 Chronic diastolic (congestive) heart failure: Secondary | ICD-10-CM

## 2011-01-02 DIAGNOSIS — I509 Heart failure, unspecified: Secondary | ICD-10-CM

## 2011-01-02 LAB — BASIC METABOLIC PANEL
BUN: 19 mg/dL (ref 6–23)
Chloride: 106 mEq/L (ref 96–112)
Creatinine, Ser: 0.9 mg/dL (ref 0.4–1.2)
GFR: 63.62 mL/min (ref >60.00)
Potassium: 4.1 mEq/L (ref 3.5–5.1)

## 2011-01-14 ENCOUNTER — Ambulatory Visit (INDEPENDENT_AMBULATORY_CARE_PROVIDER_SITE_OTHER): Payer: Medicare Other | Admitting: Internal Medicine

## 2011-01-14 ENCOUNTER — Encounter: Payer: Self-pay | Admitting: Internal Medicine

## 2011-01-14 DIAGNOSIS — I4891 Unspecified atrial fibrillation: Secondary | ICD-10-CM

## 2011-01-14 DIAGNOSIS — I495 Sick sinus syndrome: Secondary | ICD-10-CM

## 2011-01-14 DIAGNOSIS — I509 Heart failure, unspecified: Secondary | ICD-10-CM

## 2011-01-14 DIAGNOSIS — I10 Essential (primary) hypertension: Secondary | ICD-10-CM

## 2011-01-14 DIAGNOSIS — I5032 Chronic diastolic (congestive) heart failure: Secondary | ICD-10-CM

## 2011-01-14 LAB — BASIC METABOLIC PANEL
CO2: 31 mEq/L (ref 19–32)
Calcium: 9.4 mg/dL (ref 8.4–10.5)
GFR: 59.09 mL/min — ABNORMAL LOW (ref >60.00)
Potassium: 3.8 mEq/L (ref 3.5–5.1)
Sodium: 143 mEq/L (ref 135–145)

## 2011-01-14 NOTE — Assessment & Plan Note (Signed)
We'll continue her on furosemide; check a metabolic profile today

## 2011-01-14 NOTE — Assessment & Plan Note (Signed)
Bradycardia continues to be a background issue having an impact on antiarrhythmic drug choice

## 2011-01-14 NOTE — Progress Notes (Signed)
  HPI  [name] [name] is a 77 y.o. female Seen in followup for atrial fibrillation. She was recently hospitalized because of congestive heart failure that developed following cardioversion on flecainide. Retrospectively she appreciates that there have been problems with fluid overload prior to the cardioversion. She was hospitalized and treated with intravenous diuretics with marked improvement. She is currently continuing to take Lasix.  Evaluation in hospital demonstrated normal left ventricular function  Past Medical History  Diagnosis Date  . PAF (paroxysmal atrial fibrillation)   . Bradycardia   . HTN (hypertension)   . CAD (coronary artery disease)   . NASH (nonalcoholic steatohepatitis)   . PMR (polymyalgia rheumatica)   . HLD (hyperlipidemia)     Past Surgical History  Procedure Date  . Umbilical hernia repair 3845'X  . Incision and drainage breast abscess 1973    Current Outpatient Prescriptions  Medication Sig Dispense Refill  . acebutolol (SECTRAL) 200 MG capsule Take 1 tablet by mouth Twice daily.      . Calcium Carbonate-Vitamin D (CALCIUM + D) 600-200 MG-UNIT TABS Take by mouth. 1 BID       . Cholecalciferol (VITAMIN D3) 1000 UNITS tablet Take 1,000 Units by mouth daily.        . furosemide (LASIX) 20 MG tablet Take 20 mg by mouth daily.        . multivitamin (THERAGRAN) per tablet Take 1 tablet by mouth daily.        . potassium chloride SA (K-DUR,KLOR-CON) 20 MEQ tablet Take 20 mEq by mouth daily.        . valsartan (DIOVAN) 320 MG tablet Take 320 mg by mouth daily.        Marland Kitchen warfarin (COUMADIN) 2.5 MG tablet Take by mouth as directed.        Marland Kitchen DISCONTD: flecainide (TAMBOCOR) 100 MG tablet Take 1 tablet (100 mg total) by mouth 2 (two) times daily.  60 tablet  11  . DISCONTD: flecainide (TAMBOCOR) 100 MG tablet Take 50 mg by mouth 2 (two) times daily.        Marland Kitchen DISCONTD: potassium chloride (KLOR-CON) 10 MEQ CR tablet Take 10 mEq by mouth daily.        Marland Kitchen DISCONTD:  valsartan-hydrochlorothiazide (DIOVAN-HCT) 320-25 MG per tablet Take 1 tablet by mouth daily.          Allergies  Allergen Reactions  . Codeine     REACTION: feel funny  . Penicillins     REACTION: anaphylaxis  . Statins     REACTION: elevated LFT's  . Tylenol (Acetaminophen)     Review of Systems negative except from HPI and PMH  Physical Exam Well developed and well nourished in no acute distress HENT normal E scleral and icterus clear Neck Supple Clear to ausculation Regular rate and rhythm, no murmurs gallops or rub Soft with active bowel sounds No clubbing cyanosis and edema Alert and oriented, grossly normal motor and sensory function Skin Warm and Dry    Assessment and  Plan

## 2011-01-14 NOTE — Assessment & Plan Note (Signed)
Well-controlled currently

## 2011-01-14 NOTE — Patient Instructions (Signed)
Your physician has recommended you make the following change in your medication:  1) Restart flecainide at 118m 1/2 tablet by mouth twice daily.  Your physician recommends that you have lab work today: bmp ([phone].  Your physician wants you to follow-up in: 3 months. You will receive a reminder letter in the mail two months in advance. If you don't receive a letter, please call our office to schedule the follow-up appointment.

## 2011-01-14 NOTE — Assessment & Plan Note (Signed)
The patient is in sinus rhythm. His heart rate imagine that the flecainide was unresponsive for heart failure although it is a negative inotropic drugs. We will try her back on low-dose50 mg twice daily and next week she will let us know how she is doing weight was    We discussed alternatives to the flecainide including amiodarone andpropafenone which might aggravate her bradycardia and tikosyun which requires hospital initiation

## 2011-01-15 NOTE — Op Note (Signed)
  [name] [name], [name] [name] NO.:  [id number]  MEDICAL RECORD NO.:  [id number]  LOCATION:  [facility]                         FACILITY:  [facility]  PHYSICIAN:  [name], MD, FACCDATE OF BIRTH:  [birth year]  DATE OF PROCEDURE:  12/20/2010 DATE OF DISCHARGE:  12/20/2010                              OPERATIVE REPORT   PREOPERATIVE DIAGNOSIS:  Atrial fibrillation.  POSTOPERATIVE DIAGNOSIS:  Sinus rhythm.  INTERMEDIATE DIAGNOSIS:  Supraventricular tachycardia.  PROCEDURE:  Cardioversion and carotid sinus massage.  The patient received 80 mg of propofol under the care Dr. Al Corpus.  A 120 joules shock was delivered synchronously into atrial fibrillation restoring sinus rhythm.  About 3 minutes into sinus rhythm, the patient developed a regular supraventricular tachycardia.  No P-waves were discernible. Carotid sinus massage terminated the tachycardia.  The patient tolerated the procedures well.  The patient's laboratories were notable for potassium of 3.4.  The patient was supplemented with 40 mEq of potassium and then put on a prescription for 10 mEq a day in the setting of her ACE inhibitor therapy that is accompanied by hydrochlorothiazide.     [name], MD, [facility]     SCK/MEDQ  D:  12/20/2010  T:  12/21/2010  Job:  [id number]  Electronically Signed by Virl Axe MD [facility] on 01/15/2011 04:30:01 PM

## 2011-01-17 ENCOUNTER — Other Ambulatory Visit: Payer: Self-pay | Admitting: Internal Medicine

## 2011-01-17 DIAGNOSIS — Z1231 Encounter for screening mammogram for malignant neoplasm of breast: Secondary | ICD-10-CM

## 2011-01-17 NOTE — Discharge Summary (Signed)
[name] [name], APPERSON NO.:  [id number]  MEDICAL RECORD NO.:  [id number]  LOCATION:  3825                         FACILITY:  [facility]  PHYSICIAN:  Carlena Bjornstad, MD, FACCDATE OF BIRTH:  09-01-1930  DATE OF ADMISSION:  12/27/2010 DATE OF DISCHARGE:  12/28/2010                              DISCHARGE SUMMARY   PRIMARY CARDIOLOGIST:  [name], MD, [facility]  DISCHARGE DIAGNOSIS:  Acute diastolic congestive heart failure.  SECONDARY DIAGNOSES: 1. Paroxysmal atrial fibrillation, previously on flecainide therapy,     which is discontinued. 2. History of bradycardia. 3. Hypertension. 4. History of nonalcoholic steatohepatitis. 5. Polymyalgia rheumatica. 6. Hyperlipidemia.  ALLERGIES:  CODEINE, PENICILLIN, STATINS, TYLENOL.  PROCEDURES:  A 2-D echocardiogram performed on December 28, 2010, showing an EF of 60-65% with normal wall motion, mild mitral and tricuspid regurgitations.  HISTORY OF PRESENT ILLNESS:  A 77 year old female with history of atrial fibrillation who was recently started on flecainide and underwent cardioversion on December 20, 2010.  The patient was seen in the office on December 27, 2010, and was in sinus rhythm, but reported problems with dyspnea and swelling.  The patient was felt to have volume overload on exam and decision was made to admit her for further evaluation diuresis.  HOSPITAL COURSE:  The patient was treated with intravenous Lasix with symptomatic improvement and weight reduction down to 83.745 kg from 84.4 kg on admission.  The patient has expressed the wish to discontinue flecainide as this was her the most recent addition to her medications and was added just prior to symptom start.  Her flecainide has thus discontinued.  She will continue on beta-blocker and Coumadin therapy, and we will plan for discharge today in good condition.  We will send her home on Lasix and have arranged for an outpatient basic metabolic panel next  week.  DISCHARGE LABORATORIES:  INR 2.09.  Hemoglobin 13.0, hematocrit 38.0, WBC 6.1, platelets 248.  Sodium 140, potassium 3.6, chloride 103, CO2 of 29, BUN 17, creatinine 0.77, glucose 99.  Total bilirubin 0.5, alkaline phosphatase 106, AST 67, ALT 94, total protein 7.1, albumin 3.1, calcium 9.7.  CK 35, MB 1.5, troponin I less than 0.30.  TSH 0.93.  DISPOSITION:  The patient will be discharged home today in good condition.  FOLLOWUP PLANS AND APPOINTMENTS:  The patient will have basic metabolic panel in our office on January 02, 2011.  She will follow up with Dr. Caryl Comes on January 14, 2011, at 9 a.m.  DISCHARGE MEDICATIONS: 1. Diovan 320 mg daily. 2. Lasix 40 mg daily. 3. Potassium chloride 20 mEq daily. 4. Calcium carbonate plus D 1 tablet daily. 5. Coumadin 2.5 mg 2 tablets on Monday, Wednesday, Friday, 1 tablet     the other days. 6. Multivitamin 1 tablet daily. 7. Propranolol 10 mg on daily p.r.n. 8. Acebutolol 200 mg b.i.d. 9. Vitamin D3 1000 units daily.  OUTSTANDING LABORATORY STUDIES:  Follow up BMET next week.Duration of discharge encounter 35 minutes including physician time.     Murray Hodgkins, ANP   ______________________________ Carlena Bjornstad, MD, [facility]    CB/MEDQ  D:  12/28/2010  T:  12/29/2010  Job:  [id number]  Electronically Signed by Murray Hodgkins ANP on 01/04/2011 02:32:36 PM Electronically Signed by Dola Argyle MD [place] on 01/17/2011 11:58:30 AM

## 2011-01-18 ENCOUNTER — Telehealth: Payer: Self-pay | Admitting: Internal Medicine

## 2011-01-18 NOTE — Telephone Encounter (Signed)
I spoke with the patient and made her aware of Dr. Olin Pia recommendations. She verbalizes understanding. I have encouraged her to continue to weigh every day and she will let us know on Monday if she is still out of rhythm.

## 2011-01-18 NOTE — Telephone Encounter (Signed)
Pt on flecainide, C/O Afib since this am. Wt gain 2.8 lbs.  Swelling in ankles. Should pt increase lasix,

## 2011-01-18 NOTE — Telephone Encounter (Signed)
Would increase lasix to 20 bid and double potsassium alsoadn anticipate AF would revert to sinus   Call next week if hasnt

## 2011-01-18 NOTE — Telephone Encounter (Signed)
Will review with Dr. Caryl Comes.

## 2011-01-21 ENCOUNTER — Ambulatory Visit (INDEPENDENT_AMBULATORY_CARE_PROVIDER_SITE_OTHER): Payer: Medicare Other | Admitting: *Deleted

## 2011-01-21 ENCOUNTER — Telehealth: Payer: Self-pay | Admitting: *Deleted

## 2011-01-21 DIAGNOSIS — I4891 Unspecified atrial fibrillation: Secondary | ICD-10-CM

## 2011-01-21 NOTE — Telephone Encounter (Signed)
The patient was in the office today for her INR check. She states she is still in a-fib. I did feel her pulse and she is irregular at a rate around 78 bpm. She states her weight has come down. I explained I will reivew with Dr. Caryl Comes to see what his recommendations are and I will call her back. She will continue her current meds until I call her back.

## 2011-01-22 ENCOUNTER — Encounter: Payer: Self-pay | Admitting: Internal Medicine

## 2011-01-22 NOTE — Telephone Encounter (Signed)
Have and with [name] [name]. We discussed alternative drug options for right now we will try increasing flecainide from 50-100 twice daily and repeat the cardioversion on Friday.she is currently on warfarin

## 2011-01-22 NOTE — Telephone Encounter (Signed)
Forwarding to Dr. Caryl Comes.

## 2011-01-24 ENCOUNTER — Encounter: Payer: Self-pay | Admitting: *Deleted

## 2011-01-24 ENCOUNTER — Telehealth: Payer: Self-pay | Admitting: Internal Medicine

## 2011-01-24 ENCOUNTER — Ambulatory Visit (INDEPENDENT_AMBULATORY_CARE_PROVIDER_SITE_OTHER): Payer: Medicare Other

## 2011-01-24 ENCOUNTER — Other Ambulatory Visit: Payer: Self-pay | Admitting: *Deleted

## 2011-01-24 DIAGNOSIS — Z01810 Encounter for preprocedural cardiovascular examination: Secondary | ICD-10-CM

## 2011-01-24 DIAGNOSIS — I4891 Unspecified atrial fibrillation: Secondary | ICD-10-CM

## 2011-01-24 NOTE — Telephone Encounter (Signed)
FRONT DESK TO CALL WHEN PT ARRIVES  FOR EKG ./CY

## 2011-01-24 NOTE — Telephone Encounter (Signed)
Or cell [phone], pt calling re procedure she was to have at [facility] and hasn't heard back yet

## 2011-01-24 NOTE — Telephone Encounter (Signed)
SEE PREVIOUS NOTE./CY

## 2011-01-24 NOTE — Telephone Encounter (Signed)
PER PREVIOUS  PHONE NOTE DR Caryl Comes INCREASED FLECAINIDE AND WANTS PT TO HAVE CARDIOVERSION DONE ON FRI SCHEDULED  01-25-11 AT  12:00 PM./CY

## 2011-01-24 NOTE — Telephone Encounter (Signed)
Per pt call, returning call to nurse regarding cardioversion tomorrow. Pt thinks heart has gone back into rhythm, therefore pt would like to cancel cardioversion. Pt coming in to get lab work done at 4 pm. Please call back back and ensure pt is back in rhythm, and advise pt if she should cancel cardioversion or not

## 2011-01-24 NOTE — Telephone Encounter (Signed)
PT CAME IN FOR PRE PROCEDURE LABS  AND EKG WAS PERFORMED  PT THOUGHT WENT BACK INTO NORMAL RHYTHM   EKG SHOWS SR  PER DR Caryl Comes, CARDIOVERSION CANCELED FOR 01-25-11  AND LABS FOR TODAY CX  ALSO.PT WANTING TO TAKE FUROSEMIDE AND KCL AS ORIGINALLY PRESCRIBED  DISCUSSED WITH DR Caryl Comes  OKAY  TO CUT BACK ON MEDS TO ORIGINAL  DOSAGE.PT MAY TAKE EXTRA FUROSEMIDE AND KCL   IF NOTES 2-3 POUND WEIGHT GAIN IN 24 HOUR PERIOD PT AWARE AND AGREES WITH  ABOVE TX PLAN  .

## 2011-01-25 ENCOUNTER — Ambulatory Visit (HOSPITAL_COMMUNITY): Admission: RE | Admit: 2011-01-25 | Payer: Medicare Other | Source: Ambulatory Visit | Admitting: Cardiovascular Disease

## 2011-01-29 ENCOUNTER — Telehealth: Payer: Self-pay | Admitting: Internal Medicine

## 2011-01-29 NOTE — Telephone Encounter (Signed)
Pt is concerned about pulse rate she thinks the meds are making it slower it has been 46 - 50 and she was wondering was that too low

## 2011-01-29 NOTE — Telephone Encounter (Signed)
Reviewed with Dr. Caryl Comes. If the patient is feeling ok, then this is ok right now. I spoke with her and she is feeling fine. She will call back should she start to feel bad or should her rate become sustained in the low 40's.

## 2011-02-06 ENCOUNTER — Telehealth: Payer: Self-pay | Admitting: Internal Medicine

## 2011-02-06 NOTE — Telephone Encounter (Signed)
Per Dr. Caryl Comes, probably was a-fib. If this reoccurs, then have patient come in for DCCV. I have explained this to the patient and she is agreeable.

## 2011-02-06 NOTE — Telephone Encounter (Signed)
Per pt call, pt would like to report a-fib episode pt had last night. Pt said it was different than any episode she has ever had. Pt would like to inform MD and nurse of episode considering pt is on a new medication.  Please return pt call to advise/discuss.    Please call [phone] (cell) if pt does not answer at home.

## 2011-02-06 NOTE — Telephone Encounter (Signed)
I spoke with the patient. She states she was up reading around 3am this morning and she felt her heart start racing. She states this felt different than her previous episodes and she did not feel well. She go up and checked her BP/ pulse with her machine- BP- 180/120, & HR- 119 per her machine. She states this lasted about 45 minutes. She did call EMS to her house because she felt so bad. She states they did not do an EKG on her while they were there. She did start to feel better while they were there and so she did not want to go to the hospital. She is still on flecainide 196m bid. I explained I would forward this message to Dr. KCaryl Comesand see if he has any new recommendations, but I have advised her if she develops this again & she is not coming out of it, she is to go to the ER because she may require IV meds to at least help slow her rate. She is agreeable.

## 2011-02-18 ENCOUNTER — Ambulatory Visit (INDEPENDENT_AMBULATORY_CARE_PROVIDER_SITE_OTHER): Payer: Medicare Other | Admitting: *Deleted

## 2011-02-18 DIAGNOSIS — I4891 Unspecified atrial fibrillation: Secondary | ICD-10-CM

## 2011-02-19 ENCOUNTER — Ambulatory Visit
Admission: RE | Admit: 2011-02-19 | Discharge: 2011-02-19 | Disposition: A | Discharge status: Home / Self Care | Payer: Medicare Other | Source: Ambulatory Visit | Attending: Internal Medicine | Admitting: Internal Medicine

## 2011-02-19 DIAGNOSIS — Z1231 Encounter for screening mammogram for malignant neoplasm of breast: Secondary | ICD-10-CM

## 2011-02-27 ENCOUNTER — Encounter: Payer: Self-pay | Admitting: Internal Medicine

## 2011-03-05 ENCOUNTER — Telehealth: Payer: Self-pay | Admitting: Internal Medicine

## 2011-03-05 DIAGNOSIS — R001 Bradycardia, unspecified: Secondary | ICD-10-CM

## 2011-03-05 NOTE — Telephone Encounter (Signed)
LMTC

## 2011-03-05 NOTE — Telephone Encounter (Signed)
Per pt call, pt has been taking flecainide. Pt c/o of having no energy and being tired all the time pt also reported HR is in the 40's. Please return pt call to advise/discuss.

## 2011-03-11 ENCOUNTER — Other Ambulatory Visit: Payer: Self-pay | Admitting: *Deleted

## 2011-03-11 MED ORDER — WARFARIN SODIUM 2.5 MG PO TABS: ORAL_TABLET | ORAL | Status: DC

## 2011-03-12 NOTE — Telephone Encounter (Signed)
I spoke with the patient. She states that she has fatigue with a small amount of exertion. She notes on her automatic BP cuff, that her HR are more consistently running in the 40's. She states that her rates are around 47-49 typically. She will occasionally run 50-51. She is on flecainide 134m twice daily & acebutolol 2098mtwice daily . She states that her bp's are good around 130's/60's usually. She is not due to see Dr. KlCaryl Comesntil 04/16/11. I explained to the patient that I will forward to Dr. KlCaryl Comesor review. She is agreeable. I will call her back with recommendations.

## 2011-03-13 NOTE — Telephone Encounter (Signed)
i am concerned that this is her bradycardia becoing a problem--lets get a 48 hr holter  We may end up having to stop the acebutolol or think about pacing steve

## 2011-03-15 ENCOUNTER — Ambulatory Visit (INDEPENDENT_AMBULATORY_CARE_PROVIDER_SITE_OTHER): Payer: Medicare Other | Admitting: *Deleted

## 2011-03-15 DIAGNOSIS — I4891 Unspecified atrial fibrillation: Secondary | ICD-10-CM

## 2011-03-20 NOTE — Telephone Encounter (Signed)
I spoke with the patient. She is agreeable with wearing a 48 hour holter. I explained I will order this for her and then Rod Holler should be calling her to schedule this.

## 2011-03-27 ENCOUNTER — Encounter (INDEPENDENT_AMBULATORY_CARE_PROVIDER_SITE_OTHER): Payer: Medicare Other

## 2011-03-27 DIAGNOSIS — R001 Bradycardia, unspecified: Secondary | ICD-10-CM

## 2011-03-27 DIAGNOSIS — I498 Other specified cardiac arrhythmias: Secondary | ICD-10-CM

## 2011-04-02 ENCOUNTER — Telehealth: Payer: Self-pay | Admitting: *Deleted

## 2011-04-02 NOTE — Telephone Encounter (Signed)
I called the patient with her holter results and Dr. Olin Pia recommendations to decrease acebutolol to 157m bid. She is on 2054mbid. The acebutolol is a capsule per the patient and in reviewing this does not look like it comes in a 10073mapsule. The patient will take acebutolol 200m27mce daily and f/u with Dr. KleiCaryl Comes10/2 as previously scheduled.

## 2011-04-12 ENCOUNTER — Other Ambulatory Visit: Payer: Self-pay | Admitting: *Deleted

## 2011-04-12 LAB — POCT I-STAT, CHEM 8
BUN: 21
Creatinine, Ser: 1.2
Glucose, Bld: 103 — ABNORMAL HIGH
Hemoglobin: 14.3
TCO2: 28

## 2011-04-12 LAB — DIFFERENTIAL
Basophils Absolute: 0
Basophils Relative: 1
Lymphocytes Relative: 33
Neutro Abs: 2.6
Neutrophils Relative %: 49

## 2011-04-12 LAB — PROTIME-INR
INR: 2.9 — ABNORMAL HIGH
Prothrombin Time: 32.2 — ABNORMAL HIGH

## 2011-04-12 LAB — CBC
Hemoglobin: 14.1
MCHC: 33.8
Platelets: 247
RDW: 13.4

## 2011-04-12 LAB — POCT CARDIAC MARKERS
Myoglobin, poc: 118
Operator id: 277751

## 2011-04-12 MED ORDER — ACEBUTOLOL HCL 200 MG PO CAPS: 200.0000 mg | ORAL_CAPSULE | Freq: Every day | ORAL | Status: DC

## 2011-04-16 ENCOUNTER — Ambulatory Visit (INDEPENDENT_AMBULATORY_CARE_PROVIDER_SITE_OTHER): Payer: Medicare Other | Admitting: Internal Medicine

## 2011-04-16 ENCOUNTER — Encounter: Payer: Self-pay | Admitting: Internal Medicine

## 2011-04-16 ENCOUNTER — Ambulatory Visit (INDEPENDENT_AMBULATORY_CARE_PROVIDER_SITE_OTHER): Payer: Medicare Other | Admitting: *Deleted

## 2011-04-16 DIAGNOSIS — I4891 Unspecified atrial fibrillation: Secondary | ICD-10-CM

## 2011-04-16 DIAGNOSIS — I495 Sick sinus syndrome: Secondary | ICD-10-CM

## 2011-04-16 DIAGNOSIS — I10 Essential (primary) hypertension: Secondary | ICD-10-CM

## 2011-04-16 DIAGNOSIS — I498 Other specified cardiac arrhythmias: Secondary | ICD-10-CM

## 2011-04-16 NOTE — Assessment & Plan Note (Signed)
She is doing quite well on her current dose of flecainide. We will continue.

## 2011-04-16 NOTE — Assessment & Plan Note (Signed)
Currently well controllled

## 2011-04-16 NOTE — Patient Instructions (Signed)
Your physician wants you to follow-up in: 6 months with Dr. Caryl Comes. You will receive a reminder letter in the mail two months in advance. If you don't receive a letter, please call our office to schedule the follow-up appointment.  Your physician recommends that you continue on your current medications as directed. Please refer to the Current Medication list given to you today.

## 2011-04-16 NOTE — Assessment & Plan Note (Signed)
Cardia was noted on her Holter monitor with a mean rate of 58. Her acebutotl was decreased from 200-100 b.i.d.

## 2011-04-16 NOTE — Progress Notes (Signed)
  HPI  [name] [name] is a 77 y.o. female Seen in followup for atrial fibrillation.  Earlier this summer she   hospitalized because of congestive heart failure that developed following cardioversion on flecainide. Retrospectively she appreciates that there have been problems with fluid overload prior to the cardioversion. She was hospitalized and treated with intravenous diuretics with marked improvement. She is currently continuing to take Lasix.  Her heart has been amazingly quiet over the last month or 2. She is very grateful for this. She has had an intervening event where her heart was racing. She called at that time and the heart rate was about 120. It was quite frightening for her. It resolved spontaneously.  Evaluation in hospital demonstrated normal left ventricular function  Past Medical History  Diagnosis Date  . PAF (paroxysmal atrial fibrillation)   . Bradycardia   . HTN (hypertension)   . CAD (coronary artery disease)   . NASH (nonalcoholic steatohepatitis)   . PMR (polymyalgia rheumatica)   . HLD (hyperlipidemia)     Past Surgical History  Procedure Date  . Umbilical hernia repair 1610'R  . Incision and drainage breast abscess 1973    Current Outpatient Prescriptions  Medication Sig Dispense Refill  . acebutolol (SECTRAL) 200 MG capsule Take 1 tablet by mouth Twice daily.      . Calcium Carbonate-Vitamin D (CALCIUM + D) 600-200 MG-UNIT TABS Take by mouth. 1 BID       . Cholecalciferol (VITAMIN D3) 1000 UNITS tablet Take 1,000 Units by mouth daily.        . furosemide (LASIX) 20 MG tablet Take 20 mg by mouth daily.        . multivitamin (THERAGRAN) per tablet Take 1 tablet by mouth daily.        . potassium chloride SA (K-DUR,KLOR-CON) 20 MEQ tablet Take 20 mEq by mouth daily.        . valsartan (DIOVAN) 320 MG tablet Take 320 mg by mouth daily.        Marland Kitchen warfarin (COUMADIN) 2.5 MG tablet Take by mouth as directed.        Marland Kitchen DISCONTD: flecainide (TAMBOCOR) 100 MG  tablet Take 1 tablet (100 mg total) by mouth 2 (two) times daily.  60 tablet  11  . DISCONTD: flecainide (TAMBOCOR) 100 MG tablet Take 50 mg by mouth 2 (two) times daily.        Marland Kitchen DISCONTD: potassium chloride (KLOR-CON) 10 MEQ CR tablet Take 10 mEq by mouth daily.        Marland Kitchen DISCONTD: valsartan-hydrochlorothiazide (DIOVAN-HCT) 320-25 MG per tablet Take 1 tablet by mouth daily.          Allergies  Allergen Reactions  . Codeine     REACTION: feel funny  . Penicillins     REACTION: anaphylaxis  . Statins     REACTION: elevated LFT's  . Tylenol (Acetaminophen)     Review of Systems negative except from HPI and PMH  Physical Exam Well developed and well nourished in no acute distress HENT normal E scleral and icterus clear Neck Supple Clear to ausculation Regular rate and rhythm, no murmurs gallops or rub Soft with active bowel sounds No clubbing cyanosis and edema Alert and oriented, grossly normal motor function Skin Warm and Dry    Assessment and  Plan

## 2011-05-13 ENCOUNTER — Ambulatory Visit (INDEPENDENT_AMBULATORY_CARE_PROVIDER_SITE_OTHER): Payer: Medicare Other | Admitting: *Deleted

## 2011-05-13 DIAGNOSIS — I4891 Unspecified atrial fibrillation: Secondary | ICD-10-CM

## 2011-05-13 DIAGNOSIS — Z7901 Long term (current) use of anticoagulants: Secondary | ICD-10-CM

## 2011-05-13 LAB — POCT INR: INR: 1.9

## 2011-05-24 ENCOUNTER — Telehealth: Payer: Self-pay | Admitting: Internal Medicine

## 2011-05-24 NOTE — Telephone Encounter (Signed)
Follow up with pcp

## 2011-05-24 NOTE — Telephone Encounter (Signed)
I spoke with the patient. She states that she woke up this morning feeling like her heart was pounding. She sat up and felt a heaviness in her chest and pressure in her head. She thinks her pulse was regular and not fast. Her BP this morning is 151/64 & HR- 52. She has not taken any meds this morning. She did feel like she was going to faint when she was sitting on the side of her bed. She reports she has had similar episodes prior to today, but cannot relay to me how frequently she thinks they are occuring. She states they mostly happen at night and in the early morning. She thinks she has had two actual episodes of a-fib since she last saw Dr. Caryl Comes. She reports receiving a cortisone shot to her knee yesterday and also starting on Ceftin for her sinuses (x 10 days). I asked if she had an actual sinus infection. She reports that she has had a cough since September and went to her PCP. She was told she had white patches to her throat, which are being treated as possible infection. I explained that I am not certain as to what the cause of her symptoms are, but I will review with Dr. Caryl Comes and call her back. She is agreeable.

## 2011-05-24 NOTE — Telephone Encounter (Signed)
Pt having a "feeling" in her chest and head wants to see klein, pls advise

## 2011-05-24 NOTE — Telephone Encounter (Signed)
I spoke with the patient. She is aware of Dr. Olin Pia recommendations.

## 2011-05-27 ENCOUNTER — Ambulatory Visit (INDEPENDENT_AMBULATORY_CARE_PROVIDER_SITE_OTHER): Payer: Medicare Other | Admitting: *Deleted

## 2011-05-27 DIAGNOSIS — I4891 Unspecified atrial fibrillation: Secondary | ICD-10-CM

## 2011-05-27 DIAGNOSIS — Z7901 Long term (current) use of anticoagulants: Secondary | ICD-10-CM

## 2011-05-28 ENCOUNTER — Telehealth: Payer: Self-pay | Admitting: Internal Medicine

## 2011-05-28 DIAGNOSIS — I5032 Chronic diastolic (congestive) heart failure: Secondary | ICD-10-CM

## 2011-05-28 DIAGNOSIS — I4891 Unspecified atrial fibrillation: Secondary | ICD-10-CM

## 2011-05-28 NOTE — Telephone Encounter (Signed)
Pt calling re flecanide , thinks she has been taking the wrong dose, med list shows 1/2 tab twice a day and she's taking 1 tab twice a day

## 2011-05-28 NOTE — Telephone Encounter (Signed)
I spoke with the patient. She has been on Flecainide 125m twice daily. It looks like she was on Flecainide 562mtwice daily on 7/2, but this was increased around 7/10 to 10055mwice daily. I explained she should stay on her current dose of flecainide and I will change this in her chart.

## 2011-06-03 ENCOUNTER — Telehealth: Payer: Self-pay | Admitting: Internal Medicine

## 2011-06-03 NOTE — Telephone Encounter (Signed)
New problem She had an afib episode yesterday and wants to talk to you about it

## 2011-06-03 NOTE — Telephone Encounter (Signed)
I spoke with the patient. She states that yesterday she had an episode of a-fib that lasted about 9 hours. She states she felt ok, but she reports that the last couple of times that she has been out of rhythm and converted back to sinus, she has felt funny. She relates this to feelings of vertigo. She reports she will have to lay down for several minutes, then she is just washed out the rest of the day and the following day. She is concerned that this is related to the flecainide. I explained I am not certain if this is effects of flecainide vs a shift in her heart rates from high to low. She reports readings of 98 bpm while she was out of rhythm and then readings of 51 bpm after she converted. I explained I will review with Dr. Caryl Comes and call her back. She is agreeable.

## 2011-06-04 NOTE — Telephone Encounter (Signed)
I spoke with the patient last evening. She describes 2 episodes where with the termination of her palpitations she has abrupt presyncope. It lasts just moments. I interpreted this as posttermination pausing. I explained to her that she's had problems with bradycardia which is limited our ability to control her rates and/or use antiarrhythmic drugs. I suggested that a pacemaker would help this. She will consider this and will talk further

## 2011-06-11 ENCOUNTER — Telehealth: Payer: Self-pay | Admitting: Internal Medicine

## 2011-06-11 DIAGNOSIS — I4891 Unspecified atrial fibrillation: Secondary | ICD-10-CM

## 2011-06-11 NOTE — Telephone Encounter (Signed)
N/A.  LMTC. 

## 2011-06-11 NOTE — Telephone Encounter (Signed)
New Msg: Pt calling stating that she talked with Dr. Caryl Comes last week and MD told pt that he wanted her to have a monitor and nurse would contact pt to schedule monitor however, pt has not heard back from nurse/MD regarding putting on a monitor. Please return pt call to discuss further.

## 2011-06-11 NOTE — Telephone Encounter (Signed)
Pt states she talked to Dr Caryl Comes and was told she needed a monitor.  Does one need to be ordered?  If so, a holter or event monitor.   I wasn't able to see an order.

## 2011-06-17 ENCOUNTER — Ambulatory Visit (INDEPENDENT_AMBULATORY_CARE_PROVIDER_SITE_OTHER): Payer: Medicare Other | Admitting: *Deleted

## 2011-06-17 DIAGNOSIS — Z7901 Long term (current) use of anticoagulants: Secondary | ICD-10-CM

## 2011-06-17 DIAGNOSIS — I4891 Unspecified atrial fibrillation: Secondary | ICD-10-CM

## 2011-06-17 NOTE — Telephone Encounter (Signed)
Will have to review with Dr Caryl Comes and/or Nira Conn as I do not see any documentation or orders for a monitor.

## 2011-06-17 NOTE — Telephone Encounter (Signed)
Fu call  Pt is following up on this Does she need to wear one?

## 2011-06-18 NOTE — Telephone Encounter (Signed)
We had discussed pacing and it sounds like she prefers a monitor to try and document   This is not unreasonable  Auto detect monitor plx

## 2011-06-19 NOTE — Telephone Encounter (Signed)
Left message for pt on voice mail that she will be contacted to schedule by Rod Holler.

## 2011-06-25 ENCOUNTER — Other Ambulatory Visit: Payer: Self-pay | Admitting: Internal Medicine

## 2011-06-25 MED ORDER — VALSARTAN 320 MG PO TABS: 320.0000 mg | ORAL_TABLET | Freq: Every day | ORAL | Status: DC

## 2011-07-01 ENCOUNTER — Other Ambulatory Visit: Payer: Self-pay

## 2011-07-01 MED ORDER — FUROSEMIDE 20 MG PO TABS: 20.0000 mg | ORAL_TABLET | Freq: Every day | ORAL | Status: DC

## 2011-07-01 NOTE — Telephone Encounter (Signed)
.   Requested Prescriptions   Signed Prescriptions Disp Refills  . furosemide (LASIX) 20 MG tablet 30 tablet 6    Sig: Take 1 tablet (20 mg total) by mouth daily.    Authorizing Provider: Sherren Mocha D    Ordering User: Laurence Compton   E-scribe to [facility].

## 2011-07-05 ENCOUNTER — Telehealth: Payer: Self-pay | Admitting: Internal Medicine

## 2011-07-05 NOTE — Telephone Encounter (Signed)
Follow- up:   Patient returned your phone call. Please call back.

## 2011-07-05 NOTE — Telephone Encounter (Signed)
New message:  Pt would like to confirm the correct dose of lasix.  Please call her back with this information.

## 2011-07-05 NOTE — Telephone Encounter (Signed)
As we wree confused, if she is stable on the 20 that is just fine,  If she notices her weight going up or shortness of breath she should let us know

## 2011-07-05 NOTE — Telephone Encounter (Signed)
According to chart records, pt is supposed to be taking 54m of Lasix.  No answer at her number to let her know this.  [place]

## 2011-07-05 NOTE — Telephone Encounter (Signed)
Patient called, no answer.

## 2011-07-05 NOTE — Telephone Encounter (Signed)
Mrs Gosser states she has been taking Lasix 67m daily, the chart says 231m  Her old bottle states 4051mnd the new bottle states 89m62mShe states she wasn't ever told to change her dose but is not sure if she should be taking 89mg53m40mg.107m signs of swelling currently on the 40mg.21m

## 2011-07-10 NOTE — Telephone Encounter (Signed)
Pt was notified.  

## 2011-07-12 ENCOUNTER — Telehealth: Payer: Self-pay | Admitting: Internal Medicine

## 2011-07-12 NOTE — Telephone Encounter (Signed)
I spoke with the patient. I explained to her that the order for event monitor was placed on 06/19/11 and apologized that I was not certain why she had not been contacted about her monitor. I advised I would speak with Rod Holler today to see if I could find out what happened and when we could get her in for a monitor. The patient states she has had a couple of more episodes of converting from a-fib and feeling dizzy and weak, almost like a vertigo symptom, afterward. I explained to her that we do need to have her wear the event monitor as she could be having post-termination pauses. Her most recent episode was on 07/10/11. I will call her back after speaking with Rod Holler. Alvis Lemmings, RN, BSN   I have left a message for Rod Holler to call me about this. Alvis Lemmings, RN, BSN

## 2011-07-12 NOTE — Telephone Encounter (Signed)
I spoke with Rod Holler, she never received the order. She is aware and will contact the patient. I have left a message for the patient that she will be contacted.

## 2011-07-12 NOTE — Telephone Encounter (Signed)
Follow-up:   Patient called concerned about her Afib and stating that she has been waiting since 11/20 to receive a monitor placement and the last time she called in someone told her that Rod Holler would be calling her back to schedule and no one ever did.  Also has other issues she really wants to speak with you about.  If you cannot reach her on her home phone please feel free to call her cell phone.

## 2011-07-15 ENCOUNTER — Ambulatory Visit (INDEPENDENT_AMBULATORY_CARE_PROVIDER_SITE_OTHER): Payer: Medicare Other | Admitting: *Deleted

## 2011-07-15 ENCOUNTER — Encounter (INDEPENDENT_AMBULATORY_CARE_PROVIDER_SITE_OTHER): Payer: Medicare Other

## 2011-07-15 ENCOUNTER — Telehealth: Payer: Self-pay | Admitting: Physician Assistant

## 2011-07-15 DIAGNOSIS — I4891 Unspecified atrial fibrillation: Secondary | ICD-10-CM

## 2011-07-15 DIAGNOSIS — Z7901 Long term (current) use of anticoagulants: Secondary | ICD-10-CM

## 2011-07-15 LAB — POCT INR: INR: 2.1

## 2011-07-15 NOTE — Telephone Encounter (Signed)
Tiffany from [facility] called to report that Ms. Simonich had a pause this evening. She was in afib, had a 3.6sec pause then went back into sinus bradycardia. They have contacted her and she said she may have felt briefly dizzy (while sitting and talking to friends) but was now feeling perfectly fine and asymptomatic. I conveyed this to Dr. Caryl Comes who does not feel any other intervention is needed at this time.

## 2011-07-16 HISTORY — PX: PACEMAKER INSERTION: SHX728

## 2011-07-17 ENCOUNTER — Telehealth: Payer: Self-pay | Admitting: *Deleted

## 2011-07-17 ENCOUNTER — Other Ambulatory Visit: Payer: Self-pay

## 2011-07-17 DIAGNOSIS — I495 Sick sinus syndrome: Secondary | ICD-10-CM

## 2011-07-17 DIAGNOSIS — Z01810 Encounter for preprocedural cardiovascular examination: Secondary | ICD-10-CM

## 2011-07-17 MED ORDER — OLMESARTAN MEDOXOMIL 20 MG PO TABS: 20.0000 mg | ORAL_TABLET | Freq: Every day | ORAL | Status: DC

## 2011-07-17 NOTE — Telephone Encounter (Signed)
Event strips brought to me this morning by Rod Holler. The patient had her monitor placed on 07/15/11. She did have a documented 3.6 second (post termination) pause on 12/31. I notified Dr. Caryl Comes of this. He has attempted to contact the patient at her home number just now, but her line is ringing busy at home. Cell # given to Dr. Caryl Comes.

## 2011-07-18 NOTE — Telephone Encounter (Signed)
Discussed with Mrs. Hintze the findings on the monitor which demonstrated a 6 second run of atrial tachycardia followed by 3.6 second post termination pause given her prior symptoms with presyncope associated with these episodes, I think it is prudent to proceed with pacing. She also has significant resting bradycardia with heart rates in the 40s. Patient will allow chronotropic competence, medical therapy for tachycardia as well as prevention of post termination pause in. The patient is agreeable to proceeding.

## 2011-07-19 ENCOUNTER — Other Ambulatory Visit: Payer: Self-pay | Admitting: Internal Medicine

## 2011-07-19 ENCOUNTER — Encounter: Payer: Self-pay | Admitting: *Deleted

## 2011-07-19 ENCOUNTER — Encounter (HOSPITAL_COMMUNITY): Payer: Self-pay | Admitting: Pharmacy Technician

## 2011-07-19 DIAGNOSIS — I4891 Unspecified atrial fibrillation: Secondary | ICD-10-CM

## 2011-07-19 NOTE — Telephone Encounter (Signed)
I spoke with the patient. She will have a PPM implant on 07/29/11. She will come on 07/24/11 for pre-procedure labs. I explained I would put a letter of instructions at the front desk for her to pick up on 1/9 when she comes for labs. Will forward phone note to Dr. Caryl Comes for orders.

## 2011-07-22 ENCOUNTER — Telehealth: Payer: Self-pay | Admitting: Cardiovascular Disease

## 2011-07-22 DIAGNOSIS — I5032 Chronic diastolic (congestive) heart failure: Secondary | ICD-10-CM

## 2011-07-22 MED ORDER — VALSARTAN 320 MG PO TABS: 320.0000 mg | ORAL_TABLET | Freq: Every day | ORAL | Status: DC

## 2011-07-22 NOTE — Telephone Encounter (Signed)
This is Dr Olin Pia patient.

## 2011-07-22 NOTE — Telephone Encounter (Signed)
Pt calling re taking diovan a long time, refill was for benacar, wants to make sure this is correct, ok to leave message

## 2011-07-22 NOTE — Telephone Encounter (Signed)
Spoke with pt. She states pharmacy called to let her know benicar was ready. She does not take benicar but needs refill on diovan.  She has not picked up benicar.  I reviewed medication list and pt should be on Diovan 320 mg daily and not benicar. Pt confirms this is what she is taking. Will send to [facility] per her request. I called and confirmed with pharmacist at [facility] that they have diovan with refills and have removed benicar from profile.

## 2011-07-24 ENCOUNTER — Other Ambulatory Visit (INDEPENDENT_AMBULATORY_CARE_PROVIDER_SITE_OTHER): Payer: Medicare Other | Admitting: *Deleted

## 2011-07-24 ENCOUNTER — Telehealth: Payer: Self-pay | Admitting: Internal Medicine

## 2011-07-24 DIAGNOSIS — Z01810 Encounter for preprocedural cardiovascular examination: Secondary | ICD-10-CM

## 2011-07-24 DIAGNOSIS — I495 Sick sinus syndrome: Secondary | ICD-10-CM

## 2011-07-24 LAB — CBC WITH DIFFERENTIAL/PLATELET
Basophils Absolute: 0 10*3/uL (ref 0.0–0.1)
Basophils Relative: 0.3 % (ref 0.0–3.0)
Eosinophils Absolute: 0.2 10*3/uL (ref 0.0–0.7)
Hemoglobin: 12.8 g/dL (ref 12.0–15.0)
Lymphocytes Relative: 20.1 % (ref 12.0–46.0)
MCHC: 33.3 g/dL (ref 30.0–36.0)
Monocytes Relative: 8.9 % (ref 3.0–12.0)
Neutro Abs: 4.1 10*3/uL (ref 1.4–7.7)
Neutrophils Relative %: 67.1 % (ref 43.0–77.0)
RBC: 3.99 Mil/uL (ref 3.87–5.11)
RDW: 14.2 % (ref 11.5–14.6)

## 2011-07-24 LAB — PROTIME-INR
INR: 2.1 ratio — ABNORMAL HIGH (ref 0.8–1.0)
Prothrombin Time: 23.4 s — ABNORMAL HIGH (ref 10.2–12.4)

## 2011-07-24 LAB — BASIC METABOLIC PANEL
CO2: 30 mEq/L (ref 19–32)
Chloride: 107 mEq/L (ref 96–112)
Potassium: 4 mEq/L (ref 3.5–5.1)

## 2011-07-24 NOTE — Telephone Encounter (Signed)
I told [name] [name] that I did not think she needs to stop her coumadin but I will check with Dr Olin Pia nurse to be sure.  Most recent inr was 2.1 and pt does not need a sooner appt as long as her coumadin is not stopped per the coumadin clinic.

## 2011-07-24 NOTE — Telephone Encounter (Signed)
New Problem:     Patient called in because she is having a procedure on Monday and would like to know when to stop her coumadin, and when to reschedule her next coumadin appointment after her procedure.  Please call back.

## 2011-07-25 NOTE — Telephone Encounter (Signed)
I spoke with the patient and made her aware that Dr. Caryl Comes does want her to start holding her coumadin now in preparation for her procedure on Monday.

## 2011-07-28 MED ORDER — SODIUM CHLORIDE 0.9 % IR SOLN
80.0000 mg | Status: DC
  Filled 2011-07-28 (×2): qty 2

## 2011-07-28 MED ORDER — SODIUM CHLORIDE 0.9 % IV SOLN: INTRAVENOUS | Status: DC

## 2011-07-28 MED ORDER — VANCOMYCIN HCL IN DEXTROSE 1-5 GM/200ML-% IV SOLN
1000.0000 mg | INTRAVENOUS | Status: DC
  Administered 2011-07-29: 1000 mg via INTRAVENOUS
  Filled 2011-07-28 (×2): qty 200

## 2011-07-28 MED ORDER — SODIUM CHLORIDE 0.45 % IV SOLN
INTRAVENOUS | Status: DC
  Administered 2011-07-29: 07:00:00 via INTRAVENOUS

## 2011-07-29 ENCOUNTER — Ambulatory Visit (HOSPITAL_COMMUNITY)
Admission: RE | Admit: 2011-07-29 | Discharge: 2011-07-30 | Disposition: A | Discharge status: Home / Self Care | Payer: Medicare Other | Source: Ambulatory Visit | Attending: Internal Medicine | Admitting: Internal Medicine

## 2011-07-29 ENCOUNTER — Encounter (HOSPITAL_COMMUNITY)
Admission: RE | Disposition: A | Discharge status: Home / Self Care | Payer: Self-pay | Source: Ambulatory Visit | Attending: Internal Medicine

## 2011-07-29 ENCOUNTER — Encounter (HOSPITAL_COMMUNITY): Payer: Self-pay | Admitting: General Practice

## 2011-07-29 DIAGNOSIS — I4891 Unspecified atrial fibrillation: Secondary | ICD-10-CM | POA: Insufficient documentation

## 2011-07-29 DIAGNOSIS — I498 Other specified cardiac arrhythmias: Secondary | ICD-10-CM

## 2011-07-29 DIAGNOSIS — I2510 Atherosclerotic heart disease of native coronary artery without angina pectoris: Secondary | ICD-10-CM | POA: Insufficient documentation

## 2011-07-29 DIAGNOSIS — I10 Essential (primary) hypertension: Secondary | ICD-10-CM | POA: Insufficient documentation

## 2011-07-29 DIAGNOSIS — I5032 Chronic diastolic (congestive) heart failure: Secondary | ICD-10-CM

## 2011-07-29 DIAGNOSIS — I495 Sick sinus syndrome: Secondary | ICD-10-CM | POA: Insufficient documentation

## 2011-07-29 HISTORY — DX: Inflammatory liver disease, unspecified: K75.9

## 2011-07-29 HISTORY — DX: Myoneural disorder, unspecified: G70.9

## 2011-07-29 HISTORY — PX: PERMANENT PACEMAKER INSERTION: SHX5480

## 2011-07-29 LAB — SURGICAL PCR SCREEN: Staphylococcus aureus: NEGATIVE

## 2011-07-29 SURGERY — PERMANENT PACEMAKER INSERTION
Anesthesia: LOCAL

## 2011-07-29 MED ORDER — WARFARIN SODIUM 7.5 MG PO TABS
7.5000 mg | ORAL_TABLET | Freq: Once | ORAL | Status: AC
  Administered 2011-07-29: 7.5 mg via ORAL
  Filled 2011-07-29: qty 1

## 2011-07-29 MED ORDER — TRAMADOL HCL 50 MG PO TABS: 50.0000 mg | ORAL_TABLET | Freq: Two times a day (BID) | ORAL | Status: DC | PRN

## 2011-07-29 MED ORDER — MIDAZOLAM HCL 5 MG/5ML IJ SOLN
INTRAMUSCULAR | Status: AC
  Filled 2011-07-29: qty 5

## 2011-07-29 MED ORDER — FENTANYL CITRATE 0.05 MG/ML IJ SOLN
INTRAMUSCULAR | Status: AC
  Filled 2011-07-29: qty 2

## 2011-07-29 MED ORDER — VITAMIN D3 25 MCG (1000 UNIT) PO TABS
1000.0000 [IU] | ORAL_TABLET | Freq: Every day | ORAL | Status: DC
  Administered 2011-07-29: 1000 [IU] via ORAL
  Filled 2011-07-29 (×2): qty 1

## 2011-07-29 MED ORDER — POTASSIUM CHLORIDE CRYS ER 20 MEQ PO TBCR
20.0000 meq | EXTENDED_RELEASE_TABLET | Freq: Every day | ORAL | Status: DC
  Administered 2011-07-29: 20 meq via ORAL
  Filled 2011-07-29 (×2): qty 1

## 2011-07-29 MED ORDER — SODIUM CHLORIDE 0.9 % IV SOLN: 250.0000 mL | INTRAVENOUS | Status: DC | PRN

## 2011-07-29 MED ORDER — FLECAINIDE ACETATE 100 MG PO TABS
100.0000 mg | ORAL_TABLET | Freq: Two times a day (BID) | ORAL | Status: DC
  Administered 2011-07-29 – 2011-07-30 (×2): 100 mg via ORAL
  Filled 2011-07-29 (×4): qty 1

## 2011-07-29 MED ORDER — ONDANSETRON HCL 4 MG/2ML IJ SOLN: 4.0000 mg | Freq: Four times a day (QID) | INTRAMUSCULAR | Status: DC | PRN

## 2011-07-29 MED ORDER — VANCOMYCIN HCL IN DEXTROSE 1-5 GM/200ML-% IV SOLN
1000.0000 mg | Freq: Two times a day (BID) | INTRAVENOUS | Status: AC
  Administered 2011-07-29: 1000 mg via INTRAVENOUS
  Filled 2011-07-29 (×2): qty 200

## 2011-07-29 MED ORDER — MUPIROCIN 2 % EX OINT
TOPICAL_OINTMENT | CUTANEOUS | Status: AC
  Administered 2011-07-29: 1 via NASAL
  Filled 2011-07-29: qty 22

## 2011-07-29 MED ORDER — OLMESARTAN 10 MG HALF TABLET
10.0000 mg | ORAL_TABLET | Freq: Every day | ORAL | Status: DC
  Administered 2011-07-30: 10 mg via ORAL
  Filled 2011-07-29 (×2): qty 1

## 2011-07-29 MED ORDER — SODIUM CHLORIDE 0.9 % IJ SOLN: 3.0000 mL | INTRAMUSCULAR | Status: DC | PRN

## 2011-07-29 MED ORDER — METOPROLOL TARTRATE 50 MG PO TABS
50.0000 mg | ORAL_TABLET | Freq: Two times a day (BID) | ORAL | Status: DC
  Administered 2011-07-29 – 2011-07-30 (×2): 50 mg via ORAL
  Filled 2011-07-29 (×4): qty 1

## 2011-07-29 MED ORDER — WARFARIN SODIUM 2.5 MG PO TABS: 2.5000 mg | ORAL_TABLET | Freq: Every day | ORAL | Status: DC

## 2011-07-29 MED ORDER — SODIUM CHLORIDE 0.9 % IJ SOLN
3.0000 mL | Freq: Two times a day (BID) | INTRAMUSCULAR | Status: DC
  Administered 2011-07-29: 3 mL via INTRAVENOUS

## 2011-07-29 MED ORDER — LIDOCAINE HCL (PF) 1 % IJ SOLN
INTRAMUSCULAR | Status: AC
  Filled 2011-07-29: qty 60

## 2011-07-29 MED ORDER — HEPARIN (PORCINE) IN NACL 2-0.9 UNIT/ML-% IJ SOLN
INTRAMUSCULAR | Status: AC
  Filled 2011-07-29: qty 1000

## 2011-07-29 NOTE — Progress Notes (Signed)
ANTICOAGULATION CONSULT NOTE - Initial Consult  Pharmacy Consult for coumadin Indication: atrial fibrillation  Allergies  Allergen Reactions  . Codeine     REACTION: feel funny  . Penicillins     REACTION: anaphylaxis  . Statins     REACTION: elevated LFT's  . Tylenol (Acetaminophen) Other (See Comments)    If taken with Vytorin at risk for liver damage    Patient Measurements: Height: 5' 6"  (167.6 cm) Weight: 173 lb (78.472 kg) IBW/kg (Calculated) : 59.3   Vital Signs: Temp: 97.6 F (36.4 C) (01/14 0956) Temp src: Oral (01/14 0956) BP: 120/63 mmHg (01/14 1100) Pulse Rate: 60  (01/14 1100)  Labs:  Basename 07/29/11 0558  HGB --  HCT --  PLT --  APTT --  LABPROT 15.2  INR 1.18  HEPARINUNFRC --  CREATININE --  CKTOTAL --  CKMB --  TROPONINI --   Estimated Creatinine Clearance: 49 ml/min (by C-G formula based on Cr of 1).  Medical History: Past Medical History  Diagnosis Date  . PAF (paroxysmal atrial fibrillation)     flecanide  . Bradycardia   . HTN (hypertension)   . CAD (coronary artery disease)   . NASH (nonalcoholic steatohepatitis)   . PMR (polymyalgia rheumatica)   . HLD (hyperlipidemia)   . Shortness of breath   . Neuromuscular disorder   . Hepatitis     hx of medication induced hepatitis    Medications:  Prescriptions prior to admission  Medication Sig Dispense Refill  . acebutolol (SECTRAL) 200 MG capsule Take 1 capsule (200 mg total) by mouth daily.  30 capsule  6  . Calcium Carbonate-Vitamin D (CALCIUM + D) 600-200 MG-UNIT TABS Take by mouth. 1 BID       . Cholecalciferol (VITAMIN D3) 1000 UNITS tablet Take 1,000 Units by mouth daily.        . flecainide (TAMBOCOR) 100 MG tablet Take 1 tablet (100 mg total) by mouth 2 (two) times daily.      . furosemide (LASIX) 20 MG tablet Take 1 tablet (20 mg total) by mouth daily.  30 tablet  6  . multivitamin (THERAGRAN) per tablet Take 1 tablet by mouth daily.        . potassium chloride SA  (K-DUR,KLOR-CON) 20 MEQ tablet Take 20 mEq by mouth daily.        . valsartan (DIOVAN) 320 MG tablet Take 1 tablet (320 mg total) by mouth daily.  30 tablet  11  . warfarin (COUMADIN) 2.5 MG tablet Take 2.5-5 mg by mouth daily. Take as directed by Anticoagulation clinic...currently 11m Mon, Wed, Fri and 2.544mall other days          Assessment: 7861of s/p PPM placement. Was on coumadin PTA for afib and now to resume post-op. INR is subtherapeutic at 1.18. No bleeding or other problems noted. Appears pt was stable on her home regimen so will give larger dose today but hopefully can resume home regimen soon.   Goal of Therapy:  INR 2-3   Plan:  1. Coumadin 7.54m43mO x 1 tonight 2. Daily INR  Halie Gass, RacRande Lawman14/2013,11:24 AM

## 2011-07-29 NOTE — H&P (Signed)
CARDIOLOGY ADMISSION NOTE [facility]    Patient ID: [name] [name] MRN: [id number], DOB/AGE: 76/08/1930 76 y.o.  Admit date: 07/29/2011   Primary Physician: [name] Gravel, MD, MD Primary Cardiologist: [name] [name]   Chief Complaint: pauses   HPI: <[name] [name] admitted for pacemaker insertion because of tachybrady syndrome with posttermination pauses resulting in presyncope and lightheadedness  Earlier this summer she hospitalized because of congestive heart failure that developed following cardioversion on flecainide. Retrospectively she appreciates that there have been problems with fluid overload prior to the cardioversion. She was hospitalized and treated with intravenous diuretics with marked improvement. She is currently continuing to take Lasix.     Evaluation in hospital demonstrated normal left ventricular function    Past Medical History  Diagnosis Date  . PAF (paroxysmal atrial fibrillation)     flecanide  . Bradycardia   . HTN (hypertension)   . CAD (coronary artery disease)   . NASH (nonalcoholic steatohepatitis)   . PMR (polymyalgia rheumatica)   . HLD (hyperlipidemia)       Surgical History:  Past Surgical History  Procedure Date  . Umbilical hernia repair 6073'[id number]  . Incision and drainage breast abscess 1973     Prescriptions prior to admission  Medication Sig Dispense Refill  . acebutolol (SECTRAL) 200 MG capsule Take 1 capsule (200 mg total) by mouth daily.  30 capsule  6  . Calcium Carbonate-Vitamin D (CALCIUM + D) 600-200 MG-UNIT TABS Take by mouth. 1 BID       . Cholecalciferol (VITAMIN D3) 1000 UNITS tablet Take 1,000 Units by mouth daily.        . flecainide (TAMBOCOR) 100 MG tablet Take 1 tablet (100 mg total) by mouth 2 (two) times daily.      . furosemide (LASIX) 20 MG tablet Take 1 tablet (20 mg total) by mouth daily.  30 tablet  6  . multivitamin (THERAGRAN) per tablet Take 1 tablet by mouth daily.        . potassium chloride SA (K-DUR,KLOR-CON)  20 MEQ tablet Take 20 mEq by mouth daily.        . valsartan (DIOVAN) 320 MG tablet Take 1 tablet (320 mg total) by mouth daily.  30 tablet  11  . warfarin (COUMADIN) 2.5 MG tablet Take 2.5-5 mg by mouth daily. Take as directed by Anticoagulation clinic...currently 40m Mon, Wed, Fri and 2.573mall other days          Inpatient Medications:    . fentaNYL      . gentamicin irrigation  80 mg Irrigation On Call  . heparin      . lidocaine      . midazolam      . mupirocin ointment      . vancomycin  1,000 mg Intravenous On Call    Allergies:  Allergies  Allergen Reactions  . Codeine     REACTION: feel funny  . Penicillins     REACTION: anaphylaxis  . Statins     REACTION: elevated LFT's  . Tylenol (Acetaminophen) Other (See Comments)    If taken with Vytorin at risk for liver damage    History   Social History  . Marital Status: Married    Spouse Name: N/A    Number of Children: N/A  . Years of Education: N/A   Occupational History  . Not on file.   Social History Main Topics  . Smoking status: Former Smoker    Types: Cigarettes    Quit date: 07/16/1990  .  Smokeless tobacco: Not on file  . Alcohol Use: No  . Drug Use: No  . Sexually Active: Not on file   Other Topics Concern  . Not on file   Social History Narrative  . No narrative on file     Family History  Problem Relation Age of Onset  . Coronary artery disease    . Alzheimer's disease         Physical Exam:  Blood pressure 163/72, pulse 55, temperature 97 F (36.1 C), temperature source Oral, resp. rate 16, height 5' 6"  (1.676 m), weight 173 lb (78.472 kg), SpO2 98.00%. Well developed and nourished in no acute distress HENT normal Neck supple with JVP-flat Carotids brisk and full without bruits Clear Regular rate and rhythm, no murmurs or gallops Abd-soft with active BS without hepatomegaly No Clubbing cyanosis edema Skin-warm and dry A & Oriented  Grossly normal sensory and motor  function      Labs: CBC:  Lab Results  Component Value Date   WBC 6.1 07/24/2011   HGB 12.8 07/24/2011   HCT 38.5 07/24/2011   MCV 96.5 07/24/2011   PLT 227.0 07/24/2011     BMET: Lab 07/24/11 0954  NA 144  K 4.0  CL 107  CO2 30  BUN 22  CREATININE 1.0  CALCIUM 9.5  PROT --  BILITOT --  ALKPHOS --  ALT --  AST --  GLUCOSE 80    Cardiac Enzymes:No results found for this basename: CKTOTAL:4,CKMB:4,TROPONINI:4 in the last 72 hours  Miscellaneous:  No components found with this basename: POCBNP:3 No results found for this basename: CHOL, HDL, LDLCALC, TRIG   Lab Results  Component Value Date   DDIMER 0.32 12/27/2010       Radiology/Studies: No results found.   For pacer  No interval change  Denies recent chest pain shortness of breath  But a little dizziness   Patient Active Hospital Problem List: post termination pauses/.sinus bradycardia (11/17/2008)   Assessment: for pacer insertion   Signed, Virl Axe MD

## 2011-07-29 NOTE — Brief Op Note (Signed)
07/29/2011  9:06 AM  PATIENT:  [name] [name]  78 y.o. female  PRE-OPERATIVE DIAGNOSIS:  Tachy/Brady  POST-OPERATIVE DIAGNOSIS:   Tachy/Brady PROCEDURE:  Procedure(s): PERMANENT PACEMAKER INSERTION  SURGEON:  Surgeon(s): [name], MD  PHYSICIAN ASSISTANT:   ASSISTANTS: none   ANESTHESIA:   local and IV sedation  DICTATION: .Other Dictation: Dictation Number [id number]

## 2011-07-30 ENCOUNTER — Other Ambulatory Visit: Payer: Self-pay

## 2011-07-30 ENCOUNTER — Ambulatory Visit (HOSPITAL_COMMUNITY): Payer: Medicare Other

## 2011-07-30 DIAGNOSIS — I495 Sick sinus syndrome: Secondary | ICD-10-CM

## 2011-07-30 MED ORDER — METOPROLOL TARTRATE 50 MG PO TABS: 50.0000 mg | ORAL_TABLET | Freq: Two times a day (BID) | ORAL | Status: DC

## 2011-07-30 NOTE — Progress Notes (Signed)
Called Water engineer ,RN concerning pt dizziness and elevated BP 174/75.  Pt leaned forward to assess lungs sounds and said she felt bad and dizzy.  Checked BP and gave metoprolol 94m, benicar 162mand flecainide 1001m Asked pt to stay in chair and rest till episode passes.  Will use bedside commode until reassessed.  Will continue to monitor and will recheck BP.  Pt feels like she needs to have a BM but does not want to use bedside commode.  Pt resting now.  Call bell within reach.  Will continue to monitor. BurPayton Emerald

## 2011-07-30 NOTE — Progress Notes (Signed)
   ELECTROPHYSIOLOGY ROUNDING NOTE    Patient Name: [name] [name] Date of Encounter: 07-30-2011    SUBJECTIVE:Patient is status post pacemaker insertion for tachy brady syndrome.  Feels well.  No chest pain or shortness of breath.  Only very mild incisional soreness.  She wants to go home  TELEMETRY: Reviewed telemetry pt atrial pacing with intrinsic ventricular conduction- PR 356mec  Physical Exam: Filed Vitals:   07/29/11 2002 07/29/11 2100 07/30/11 0643 07/30/11 0656  BP: 119/68  180/73 164/83  Pulse:  60 60   Temp:  98.1 F (36.7 C) 97.5 F (36.4 C)   TempSrc:  Oral Oral   Resp:  18 19   Height:      Weight:      SpO2:  96% 98%     GEN- The patient is well appearing, alert and oriented x 3 today. Up to chair   Head- normocephalic, atraumatic Eyes-  Sclera clear, conjunctiva pink Ears- hearing intact Oropharynx- clear Neck- supple, no JVP Lymph- no cervical lymphadenopathy Lungs- Clear to ausculation bilaterally, normal work of breathing Chest- pacemaker pocket is without hematoma Heart- Regular rate and rhythm, no murmurs, rubs or gallops, PMI not laterally displaced GI- soft, NT, ND, + BS Extremities- no clubbing, cyanosis, or edema  ekg today reveals atrial pacing  Pacemaker interrogation- reviewed in detail today    LABS: INR 1.18- Coumadin held prior to procedure  Radiology/Studies:  Final result pending, atrial lead pulled back   DEVICE INTERROGATION: Device interrogated by industry.  Lead values including impedence, sensing, threshold within normal values.  Atrial lead numbers consistent with implant.   A/P Bradycardia- doing well s/p PPM CXR reviewed,  Interrogation today is normal Will discharge to home Routine wound care and follow-up  JTrude Mcburney7:41 AM 07/30/2011

## 2011-07-30 NOTE — Progress Notes (Signed)
Pt/family given discharge instructions, medication list, follow up appointments, and s/sx of infection and when to call the doctor.  Pt/family verbalized understanding. Payton Emerald

## 2011-07-30 NOTE — Op Note (Signed)
NAMEKASHMERE, DAYWALT NO.:  [id number]  MEDICAL RECORD NO.:  [id number]  LOCATION:  2037                         FACILITY:  [facility]  PHYSICIAN:  [name], MD, FACCDATE OF BIRTH:  1930-12-21  DATE OF PROCEDURE: DATE OF DISCHARGE:                              OPERATIVE REPORT   PREOPERATIVE DIAGNOSIS:  Tachy-brady syndrome with post termination pauses and pre syncope.  POSTOPERATIVE DIAGNOSIS:  Tachy-brady syndrome with post termination pauses and pre syncope.  PROCEDURE:  Dual-chamber pacemaker implantation.  Following obtaining informed consent, the patient was brought to electrophysiology laboratory and placed on the fluoroscopic table in supine position.  After routine prep and drape of the left upper chest, lidocaine was infiltrated in prepectoral subclavicular region.  An incision was made and carried down to layer of the prepectoral fascia using electrocautery and sharp dissection.  A pocket was performed similarly.  Hemostasis was obtained.  Thereafter, attention was turned to gain access to the extrathoracic left subclavian vein, which was accomplished without difficulty without the aspiration of air or puncture of the artery.  Two separate venipunctures were accomplished.  Guidewires were placed and retained. Sequentially, 7-French sheaths were placed through which we passed a Britton, 58 cm passive fixation ventricular lead, serial number [id number] and a Medtronic 5076, 52 cm active fixation atrial lead, serial number [id number].  These leads were manipulated to the right ventricular apex and the right atrial posterior wall respectively where the bipolar R-wave was 14.8 with a pace impedance of 829, a threshold of 0.6 at 0.5.  There is no diaphragmatic pacing at 10 V.  This lead was secured to the prepectoral fascia.  Multiple sites in the right atrial appendage on the right atrial septal area were mapped with an adequate pacing.  On  our final location, the P- wave was 3.4 with a pace impedance of 835, a threshold of 2.6 at 0.5. Current of injury was reasonable, and there was no diaphragmatic pacing at 10 V.  These leads were secured to the prepectoral fascia and attached to Medtronic Adapta L pulse generator, serial number [id number].  Ventricular pacing and then atrial pacing were identified.  The pocket was copiously irrigated with antibiotic-containing saline solution.  Hemostasis was assured.  Leads and pulse generator were placed in the pocket and secured to the prepectoral fascia.  The wound was closed in 2 layers in a normal fashion.  The wound was washed, dried, and a benzoin and Steri-Strip dressing was applied.  Needle counts, sponge counts, and instrument counts were correct at the end of the procedure.  FLUOROSCOPY TIME:  4 minutes.    [name], MD, [facility]    SCK/MEDQ  D:  07/29/2011  T:  07/30/2011  Job:  [id number]

## 2011-07-30 NOTE — Plan of Care (Signed)
Problem: Phase II Progression Outcomes Goal: Other Phase II Outcomes/Goals Outcome: Completed/Met Date Met:  07/30/11 Activity level and plan of care for the night

## 2011-08-01 ENCOUNTER — Telehealth: Payer: Self-pay | Admitting: Internal Medicine

## 2011-08-01 DIAGNOSIS — R0602 Shortness of breath: Secondary | ICD-10-CM

## 2011-08-01 NOTE — Telephone Encounter (Signed)
I spoke with the patient and she reports that before she left the hospital on Tuesday s/p PPM implant, that she had an episode of dizziness and feelings of pre-syncope. She was told this was probably related to her BP- she thinks her SBP was around 170. Yesterday, she reports two episodes of dizziness and like she might pass out. She checked her BP and she was 183/85 in the morning, 154/68 mid-day, and 177/84 last night. This morning she reports her reading at 181/88. The week prior to her implant, she was about 140-169/72. Her acebutolol was d/c'ed in the hospital and replaced with metoprolol 43m BID. She is also on lasix 280monce daily and Diovan 32073mnce daily. She is concerned that the flecainide is causing some of the symptoms she is having. I explained to her that flecainide should not effect her BP. I will review with Dr. HocPercival SpanishOD) to see what he recommends for BP control today and then forward to Dr. KleCaryl Comes he can be aware. The patient is requesting that Dr. KleCaryl Comesow her concerns about the flecainide. She states she "googled" flecainide and her symptoms correlate with what she has read.

## 2011-08-01 NOTE — Telephone Encounter (Signed)
I spoke with Dr. Percival Spanish. Orders received to have the patient increase metoprolol tart to 70m TID. I have advised the patient of this and she verbalizes understanding. I made her aware that I will still pass along her concerns for the flecainide to Dr. KCaryl Comes She thinks that she did have some feelings of possible a-fib yesterday. I am uncertain if her episodes may actually be related to intermittent a-fib vs her elevated BP. She will continue to track her BP's. I will call her back after Dr. KCaryl Comesreviews.

## 2011-08-01 NOTE — Telephone Encounter (Signed)
Pt had pacer implant on Monday, came home yesterday, BP very high, has a headache, dizzy spells, pl [phone]

## 2011-08-05 NOTE — Telephone Encounter (Signed)
Flecanide can certainly cause dizziness and would agree that we should stop it.  If metoprolol is not doing the job with her blood pressure than we stop it and try labetolol 200 bid and add HCTZ to her diiovan 12.5 ;  Her BP should not be the explanation for her dizziness

## 2011-08-06 ENCOUNTER — Other Ambulatory Visit: Payer: Self-pay

## 2011-08-06 MED ORDER — POTASSIUM CHLORIDE CRYS ER 20 MEQ PO TBCR: 20.0000 meq | EXTENDED_RELEASE_TABLET | Freq: Every day | ORAL | Status: DC

## 2011-08-06 MED ORDER — HYDROCHLOROTHIAZIDE 12.5 MG PO CAPS: 12.5000 mg | ORAL_CAPSULE | Freq: Every day | ORAL | Status: DC

## 2011-08-06 NOTE — Telephone Encounter (Signed)
I spoke with the patient regarding her symptoms and Dr. Olin Pia recommendations. She is very concerned that her SOB is persistent and was not until after she had her device and was taken off her acebutolol. Dr. Caryl Comes was in the office and I reviewed this with him. He recommends that the patient have an echo and chest x-ray for SOB. We will d/c her flecainide as this may be contributing to her dizziness. Her BP yesterday was 142/75 (the lowest she has recorded since increasing her metoprolol last week) and 157/80 this morning. Per Dr. Caryl Comes, d/c metoprolol and resume acebutolol. The patient reports that her BP was controlled on acebutolol. She was previously on 200 mg once daily. We will resume this dose. We will also add hctz 12.5 mg to her regimen for possible fluid. Per Dr. Caryl Comes, he would like the patient to follow up with Truitt Merle, NP after her echo and chest x-ray. She will be here on Monday 1/28 for coumadin and a wound check. She will see Cecille Rubin at 9:15 am. She will come tomorrow for her echo to be done at 1:00pm and she will go to [place] for her x-ray. She will monitor and record her BP readings and a-fib. She verbalizes understanding of the above.

## 2011-08-06 NOTE — Telephone Encounter (Signed)
F/U  Patient is still experiencing dizziness and shortness of breath and requests a call from Dr. Danie Chandler Nurse.  Patient can be reached at hm# today.

## 2011-08-07 ENCOUNTER — Ambulatory Visit (HOSPITAL_COMMUNITY): Payer: Medicare Other | Attending: Cardiology | Admitting: Radiology

## 2011-08-07 ENCOUNTER — Ambulatory Visit
Admission: RE | Admit: 2011-08-07 | Discharge: 2011-08-07 | Disposition: A | Discharge status: Home / Self Care | Payer: Medicare Other | Source: Ambulatory Visit | Attending: Internal Medicine | Admitting: Internal Medicine

## 2011-08-07 DIAGNOSIS — R0602 Shortness of breath: Secondary | ICD-10-CM

## 2011-08-07 DIAGNOSIS — I10 Essential (primary) hypertension: Secondary | ICD-10-CM | POA: Insufficient documentation

## 2011-08-07 DIAGNOSIS — I4891 Unspecified atrial fibrillation: Secondary | ICD-10-CM | POA: Insufficient documentation

## 2011-08-07 DIAGNOSIS — E785 Hyperlipidemia, unspecified: Secondary | ICD-10-CM | POA: Insufficient documentation

## 2011-08-12 ENCOUNTER — Ambulatory Visit (INDEPENDENT_AMBULATORY_CARE_PROVIDER_SITE_OTHER): Payer: Medicare Other | Admitting: *Deleted

## 2011-08-12 ENCOUNTER — Encounter: Payer: Self-pay | Admitting: Nurse Practitioner

## 2011-08-12 ENCOUNTER — Encounter: Payer: Self-pay | Admitting: Internal Medicine

## 2011-08-12 ENCOUNTER — Ambulatory Visit (INDEPENDENT_AMBULATORY_CARE_PROVIDER_SITE_OTHER): Payer: Medicare Other | Admitting: Nurse Practitioner

## 2011-08-12 VITALS — BP 138/80 | HR 78 | Ht 66.0 in | Wt 171.8 lb

## 2011-08-12 DIAGNOSIS — I4891 Unspecified atrial fibrillation: Secondary | ICD-10-CM

## 2011-08-12 DIAGNOSIS — I10 Essential (primary) hypertension: Secondary | ICD-10-CM

## 2011-08-12 DIAGNOSIS — Z7901 Long term (current) use of anticoagulants: Secondary | ICD-10-CM

## 2011-08-12 DIAGNOSIS — I7789 Other specified disorders of arteries and arterioles: Secondary | ICD-10-CM

## 2011-08-12 LAB — BASIC METABOLIC PANEL
BUN: 21 mg/dL (ref 6–23)
CO2: 30 mEq/L (ref 19–32)
Calcium: 9.7 mg/dL (ref 8.4–10.5)
Chloride: 102 mEq/L (ref 96–112)
Creatinine, Ser: 1.1 mg/dL (ref 0.4–1.2)
GFR: 52.69 mL/min — ABNORMAL LOW (ref >60.00)
Glucose, Bld: 106 mg/dL — ABNORMAL HIGH (ref 70–99)
Potassium: 3.7 mEq/L (ref 3.5–5.1)
Sodium: 140 mEq/L (ref 135–145)

## 2011-08-12 NOTE — Assessment & Plan Note (Addendum)
She is currently in sinus. Pacemaker check shows that her rhythm has basically been ok. She remains off of her Flecainide. She is on coumadin. Could just manage with rate control and anticoagulation. I suspect that what she was feeling over the weekend was more anxiety driven.

## 2011-08-12 NOTE — Progress Notes (Signed)
[name] [name] Date of Birth: June 25, 1931 Medical Record #[id number]  History of Present Illness: [name] [name] is seen today for a work in visit. She is seen for Dr. Caryl Comes. She has had a recent PTVP implant for afib and bradycardia. She has had multiple complaints since her procedure. Her history is a little hard to follow. She is anxious as a general rule. She notes that she had her pacemaker implanted earlier this month. She has been on Flecainide for her PAF but this was stopped due to dizziness. She has had her beta blocker changed from acebutolol to metoprolol but is now back on her acebutolol. She was short of breath after the procedure, but this is better. She is now on HCTZ and Lasix in low doses. We will need to check her chemistries today. She has had a follow up CXR which was ok. She has had an echo since the pacemaker which showed a normal EF at 55% but with a dilated ascending aorta at 4.8 cm.   She notes that she feels no different off the Flecainide. She thinks she had some atrial fib over the weekend. She is back on her coumadin. Her blood pressure was elevated but has trended down nicely over the last week. She is quite anxious.   Current Outpatient Prescriptions on File Prior to Visit  Medication Sig Dispense Refill  . acebutolol (SECTRAL) 200 MG capsule Take 1 capsule (200 mg total) by mouth 2 (two) times daily.      . Calcium Carbonate-Vitamin D (CALCIUM + D) 600-200 MG-UNIT TABS Take by mouth. 1 BID       . Cholecalciferol (VITAMIN D3) 1000 UNITS tablet Take 1,000 Units by mouth daily.        . furosemide (LASIX) 20 MG tablet Take 1 tablet (20 mg total) by mouth daily.  30 tablet  6  . hydrochlorothiazide (MICROZIDE) 12.5 MG capsule Take 1 capsule (12.5 mg total) by mouth daily.  30 capsule  6  . multivitamin (THERAGRAN) per tablet Take 1 tablet by mouth daily.        . potassium chloride SA (K-DUR,KLOR-CON) 20 MEQ tablet Take 1 tablet (20 mEq total) by mouth daily.  30 tablet   6  . valsartan (DIOVAN) 320 MG tablet Take 1 tablet (320 mg total) by mouth daily.  30 tablet  11  . warfarin (COUMADIN) 2.5 MG tablet Take 2.5-5 mg by mouth daily. Take as directed by Anticoagulation clinic...currently 49m Mon, Wed, Fri and 2.534mall other days          Allergies  Allergen Reactions  . Codeine     REACTION: feel funny  . Penicillins     REACTION: anaphylaxis  . Statins     REACTION: elevated LFT's  . Tylenol (Acetaminophen) Other (See Comments)    If taken with Vytorin at risk for liver damage    Past Medical History  Diagnosis Date  . PAF (paroxysmal atrial fibrillation)     Previously on flecanide  . Bradycardia     s/p PTVP Jan 2014  . HTN (hypertension)   . CAD (coronary artery disease)   . NASH (nonalcoholic steatohepatitis)   . PMR (polymyalgia rheumatica)   . HLD (hyperlipidemia)   . Shortness of breath   . Neuromuscular disorder   . Hepatitis     hx of medication induced hepatitis  . Ascending aorta enlargement     4.8 cm per echo 08/07/11    Past Surgical History  Procedure  Date  . Umbilical hernia repair 7159'B  . Incision and drainage breast abscess 1973  . Cardiac catheterization   . Hernia repair   . Pacemaker insertion Jan 2013    History  Smoking status  . Former Smoker  . Types: Cigarettes  . Quit date: 07/16/1990  Smokeless tobacco  . Never Used    History  Alcohol Use No    Family History  Problem Relation Age of Onset  . Coronary artery disease    . Alzheimer's disease      Review of Systems: The review of systems is per the HPI.  All other systems were reviewed and are negative.  Physical Exam: BP 138/80  Pulse 78  Ht 5' 6"  (1.676 m)  Wt 171 lb 12.8 oz (77.928 kg)  BMI 27.73 kg/m2 Patient is very pleasant, but anxious and in no acute distress. Skin is warm and dry. Color is normal.  HEENT is unremarkable. Normocephalic/atraumatic. PERRL. Sclera are nonicteric. Neck is supple. No masses. No JVD. Lungs are  clear. Pacemaker in the left upper chest looks ok. Steri strips are loosened up. Cardiac exam shows a regular rate and rhythm. Abdomen is soft. Extremities are without edema. Gait and ROM are intact. No gross neurologic deficits noted.   LABORATORY DATA: Her pacemaker is checked today. Only one mode switch since implant but it was less than a minute. She is currently overriding her device. Rate response was turned on.    Assessment / Plan:

## 2011-08-12 NOTE — Patient Instructions (Signed)
We are going to check some labs today.  We need to arrange for a scan on your chest to see how big your aorta is. It does appear enlarged off the ultrasound of your heart.  Keep a check on your blood pressure.  I will see you in 2 weeks.  Call the [facility] office at [phone] if you have any questions, problems or concerns.

## 2011-08-12 NOTE — Assessment & Plan Note (Signed)
Echo shows aortic enlargement. Will arrange for CT angio to further define. Patient was informed. Stressed the importance of good blood pressure control. I will see her back in about 2 weeks with Dr. Caryl Comes. Patient is agreeable to this plan and will call if any problems develop in the interim.

## 2011-08-12 NOTE — Progress Notes (Signed)
Wound check pacer in clinic  

## 2011-08-12 NOTE — Assessment & Plan Note (Signed)
She has had several med changes over the last week or so. I have left her on her current regimen for now. We do need to check some chemistries today. I doubt she will need to be on both Lasix and HCTZ. She will continue to monitor at home.

## 2011-08-13 ENCOUNTER — Ambulatory Visit (INDEPENDENT_AMBULATORY_CARE_PROVIDER_SITE_OTHER)
Admission: RE | Admit: 2011-08-13 | Discharge: 2011-08-13 | Disposition: A | Discharge status: Home / Self Care | Payer: Medicare Other | Source: Ambulatory Visit | Attending: Nurse Practitioner | Admitting: Nurse Practitioner

## 2011-08-13 DIAGNOSIS — I7789 Other specified disorders of arteries and arterioles: Secondary | ICD-10-CM

## 2011-08-13 MED ORDER — IOHEXOL 300 MG/ML  SOLN
100.0000 mL | Freq: Once | INTRAMUSCULAR | Status: AC | PRN
  Administered 2011-08-13: 100 mL via INTRAVENOUS

## 2011-08-14 ENCOUNTER — Other Ambulatory Visit: Payer: Medicare Other

## 2011-08-16 ENCOUNTER — Ambulatory Visit (INDEPENDENT_AMBULATORY_CARE_PROVIDER_SITE_OTHER): Payer: Medicare Other | Admitting: Pharmacist

## 2011-08-16 DIAGNOSIS — I4891 Unspecified atrial fibrillation: Secondary | ICD-10-CM

## 2011-08-16 DIAGNOSIS — Z7901 Long term (current) use of anticoagulants: Secondary | ICD-10-CM

## 2011-08-16 LAB — POCT INR: INR: 2.5

## 2011-08-19 ENCOUNTER — Encounter: Payer: Self-pay | Admitting: *Deleted

## 2011-08-26 ENCOUNTER — Ambulatory Visit: Payer: Medicare Other | Admitting: Nurse Practitioner

## 2011-08-27 ENCOUNTER — Other Ambulatory Visit: Payer: Self-pay | Admitting: Pharmacist

## 2011-08-27 MED ORDER — WARFARIN SODIUM 2.5 MG PO TABS: ORAL_TABLET | ORAL | Status: DC

## 2011-09-03 ENCOUNTER — Ambulatory Visit (INDEPENDENT_AMBULATORY_CARE_PROVIDER_SITE_OTHER): Payer: Medicare Other | Admitting: Nurse Practitioner

## 2011-09-03 ENCOUNTER — Encounter: Payer: Self-pay | Admitting: Nurse Practitioner

## 2011-09-03 VITALS — BP 142/82 | HR 80 | Ht 66.0 in | Wt 172.0 lb

## 2011-09-03 DIAGNOSIS — I10 Essential (primary) hypertension: Secondary | ICD-10-CM

## 2011-09-03 DIAGNOSIS — I7789 Other specified disorders of arteries and arterioles: Secondary | ICD-10-CM

## 2011-09-03 DIAGNOSIS — I4891 Unspecified atrial fibrillation: Secondary | ICD-10-CM

## 2011-09-03 DIAGNOSIS — Z7901 Long term (current) use of anticoagulants: Secondary | ICD-10-CM

## 2011-09-03 MED ORDER — ACEBUTOLOL HCL 200 MG PO CAPS: 200.0000 mg | ORAL_CAPSULE | Freq: Two times a day (BID) | ORAL | Status: DC

## 2011-09-03 NOTE — Assessment & Plan Note (Signed)
Not interested in getting back on her Flecainide. She was on Sectral BID prior to her pacemaker implant. Will increase her dose back to BID. She will see Dr. Caryl Comes as scheduled in April. Patient is agreeable to this plan and will call if any problems develop in the interim.

## 2011-09-03 NOTE — Assessment & Plan Note (Signed)
CT measurement reading a 5.0 cm ascending aorta. Dr. Caryl Comes has been discussing this with Dr. Servando Snare. Will make a follow up visit with him.

## 2011-09-03 NOTE — Assessment & Plan Note (Signed)
Blood pressure looks good. Her last BMET was ok. Will continue to monitor.

## 2011-09-03 NOTE — Progress Notes (Signed)
[name] [name] Date of Birth: 1930/09/12 Medical Record #[id number]  History of Present Illness: [name] [name] is seen back today for a 2 week check. She is seen for Dr. Caryl Comes. She has PAF and has a new pacemaker in place. She is no longer on Flecainide due to dizziness and remains on her chronic Coumadin. She did not tolerate Metoprolol and is back on her Sectral. She did have shortness of breath post procedure. She is now on some low dose Lasix and HCTZ. Her echo was ok with a normal EF but with a dilated ascending aorta. She has had her CT which measures it as 5.0 cm. Dr. Caryl Comes has corresponded with Dr. Servando Snare and she will need an OV with him to discuss further.  She comes in today. She remains very anxious. Blood pressure readings from home are satisfactory. She continues to have some good days and bad days. Yesterday was a bad one. She felt like there was a "slow ballooning feeling that then recedes". No real palpitations. She used to take her Sectral BID and is asking to go back on this dose.   Current Outpatient Prescriptions on File Prior to Visit  Medication Sig Dispense Refill  . Calcium Carbonate-Vitamin D (CALCIUM + D) 600-200 MG-UNIT TABS Take by mouth. 1 BID       . Cholecalciferol (VITAMIN D3) 1000 UNITS tablet Take 1,000 Units by mouth daily.        . furosemide (LASIX) 20 MG tablet Take 1 tablet (20 mg total) by mouth daily.  30 tablet  6  . hydrochlorothiazide (MICROZIDE) 12.5 MG capsule Take 1 capsule (12.5 mg total) by mouth daily.  30 capsule  6  . multivitamin (THERAGRAN) per tablet Take 1 tablet by mouth daily.        . potassium chloride SA (K-DUR,KLOR-CON) 20 MEQ tablet Take 1 tablet (20 mEq total) by mouth daily.  30 tablet  6  . valsartan (DIOVAN) 320 MG tablet Take 1 tablet (320 mg total) by mouth daily.  30 tablet  11  . warfarin (COUMADIN) 2.5 MG tablet Take as directed by Anticoagulation clinic..  60 tablet  3  . DISCONTD: acebutolol (SECTRAL) 200 MG capsule Take  200 mg by mouth daily.         Allergies  Allergen Reactions  . Codeine     REACTION: feel funny  . Penicillins     REACTION: anaphylaxis  . Statins     REACTION: elevated LFT's  . Tylenol (Acetaminophen) Other (See Comments)    If taken with Vytorin at risk for liver damage    Past Medical History  Diagnosis Date  . PAF (paroxysmal atrial fibrillation)     Previously on flecanide  . Bradycardia     s/p PTVP Jan 2014  . HTN (hypertension)   . CAD (coronary artery disease)   . NASH (nonalcoholic steatohepatitis)   . PMR (polymyalgia rheumatica)   . HLD (hyperlipidemia)   . Shortness of breath   . Neuromuscular disorder   . Hepatitis     hx of medication induced hepatitis  . Ascending aorta enlargement     4.8 cm per echo 08/07/11; 5.0 by CT in Jan 2013    Past Surgical History  Procedure Date  . Umbilical hernia repair 7846'N  . Incision and drainage breast abscess 1973  . Cardiac catheterization   . Hernia repair   . Pacemaker insertion Jan 2013    History  Smoking status  . Former Smoker  .  Types: Cigarettes  . Quit date: 07/16/1990  Smokeless tobacco  . Never Used    History  Alcohol Use No    Family History  Problem Relation Age of Onset  . Coronary artery disease    . Alzheimer's disease      Review of Systems: The review of systems is per the HPI.  All other systems were reviewed and are negative.  Physical Exam: BP 142/82  Pulse 80  Ht 5' 6"  (1.676 m)  Wt 172 lb (78.019 kg)  BMI 27.76 kg/m2 Patient is very pleasant and in no acute distress. She is very anxious. Skin is warm and dry. Color is normal.  HEENT is unremarkable. Normocephalic/atraumatic. PERRL. Sclera are nonicteric. Neck is supple. No masses. No JVD. Lungs are clear. Cardiac exam shows a regular rate and rhythm. Pacemaker in the left upper chest looks good. Abdomen is soft. Extremities are without edema. Gait and ROM are intact. No gross neurologic deficits noted.   LABORATORY  DATA:   Assessment / Plan:

## 2011-09-03 NOTE — Assessment & Plan Note (Signed)
She remains on her coumadin. No adverse reaction noted.

## 2011-09-03 NOTE — Patient Instructions (Signed)
Increase the Sectral to two times a day.  We will get you an appointment to see Dr. Servando Snare regarding your aorta.  Keep your April appointment with Dr. Caryl Comes.  Call the [facility] office at [phone] if you have any questions, problems or concerns.

## 2011-09-04 ENCOUNTER — Telehealth: Payer: Self-pay | Admitting: Internal Medicine

## 2011-09-04 NOTE — Telephone Encounter (Signed)
Filled 09/03/11

## 2011-09-04 NOTE — Telephone Encounter (Signed)
Fu call Pt called back and said a rx has already been called to pharmacy

## 2011-09-04 NOTE — Telephone Encounter (Signed)
Pt wants to have rx called in for  acebutolol  was increased to two a day, will need 30 day supply, uses [facility]

## 2011-09-09 ENCOUNTER — Ambulatory Visit (INDEPENDENT_AMBULATORY_CARE_PROVIDER_SITE_OTHER): Payer: Medicare Other | Admitting: Pharmacist

## 2011-09-09 DIAGNOSIS — I4891 Unspecified atrial fibrillation: Secondary | ICD-10-CM

## 2011-09-09 DIAGNOSIS — Z7901 Long term (current) use of anticoagulants: Secondary | ICD-10-CM

## 2011-09-09 LAB — POCT INR: INR: 2.2

## 2011-09-26 ENCOUNTER — Institutional Professional Consult (permissible substitution) (INDEPENDENT_AMBULATORY_CARE_PROVIDER_SITE_OTHER): Payer: Medicare Other | Admitting: Cardiothoracic Surgery

## 2011-09-26 ENCOUNTER — Encounter: Payer: Self-pay | Admitting: Cardiothoracic Surgery

## 2011-09-26 VITALS — BP 144/92 | HR 76 | Resp 18 | Ht 66.0 in | Wt 175.0 lb

## 2011-09-26 DIAGNOSIS — I712 Thoracic aortic aneurysm, without rupture: Secondary | ICD-10-CM

## 2011-09-27 ENCOUNTER — Telehealth: Payer: Self-pay | Admitting: Internal Medicine

## 2011-09-27 NOTE — Telephone Encounter (Signed)
Pt saw Dr. Servando Snare yesterday for evaluation of AAA.  Dr. Servando Snare reccommended that pt contact our office to adjust her BP meds.  Her blood pressure while in his office yesterday was 140/90.  He said her bp should be lower.  Per Truitt Merle, NP in our office pt should keep an accurate log of blood pressures for 2 weeks then report back to [place] for bp med adjustment.  Pt agrees.

## 2011-09-27 NOTE — Telephone Encounter (Signed)
Please return call to patient at [facility] or mobile #   Patient would like to speak with nurse regarding blood pressure med. Patient is leaving to go out of town at [employer] today.

## 2011-10-01 ENCOUNTER — Encounter: Payer: Self-pay | Admitting: Cardiothoracic Surgery

## 2011-10-01 NOTE — Patient Instructions (Signed)
Thoracic Aortic Aneurysm An aneurysm is the enlargement (dilatation), bulging or ballooning out of part of the wall of a vein or artery. An Aortic Aneurysm is a bulging in the largest artery of the body. This artery supplies blood from the heart to the rest of the body. The first part of the aorta is called the thoracic aorta. It leaves the heart, ascends (rises), arches, and descends (goes down) through the chest until it reaches the diaphragm (the muscular partition between the chest and abdomen (belly). The second part of the aorta is then called the abdominal aorta after it has passed the diaphragm and continues down through the abdomen. The abdominal aorta ends where it splits to form the two iliac arteries that go to the legs. Aortic aneurysms can develop anywhere along the length of the aorta. A thoracic aortic aneurysm (TAA) occurs in the first part of the aorta, between the heart and the diaphragm. The major importance of an aneurysm is that it can rupture or tear (dissect), causing death unless diagnosed and treated promptly. CAUSES  Most thoracic aortic aneurysms are related to arteriosclerosis. The arteriosclerosis can weaken the aortic wall. The pressure of the blood being pumped through the aorta causes it to balloon out at the site of weakness. Therefore, elevated blood pressure (hypertension) is associated with aneurysm. Other risk factors include:  Age over 39.   Tobacco use.   Female sex.   Family history of aneurysm.  Additional causes of thoracic aortic aneurysms include:  Genetics (passed by birth).   Injury: After physical trauma to the aorta.   Inflammation of blood vessels.   Hardening of the arteries.   Infection.  SYMPTOMS  Many aneurysms do not cause problems. A small, unchanging or slowly changing aneurysm may produce no symptoms until it suddenly ruptures or dissects (separation of the layers of the aortic wall) without warning. It may then cause death. The  symptoms (problems) of a developing aneurysm will partly depend on its size and rate of growth. Thoracic aortic aneurysms may cause pain in the:  Chest.   Back.   Sides.   Abdomen.  The pain most often has a deep quality as if it is boring into the person. It may cause:  Heart failure.   Heart attack.   Hoarseness.   Cough.   Shortness of breath.   Swallowing problems.  DIAGNOSIS  A thoracic aortic aneurysm may be suspected based on your symptoms. It may also be detected by x-ray or CT studies done for unrelated reasons.  Several different imaging studies can be used to confirm a TAA:  An echocardiogram is an ultrasound test to examine the heart. It can also examine the first parts of the aorta. Sometimes, this test is done by putting you to sleep and inserting a flexible telescope through your mouth into your esophagus, which is next to the aorta; excellent pictures of the aorta can be obtained. This is called a transesophageal echocardiogram (TEE).   CT scanning of the chest is accurate at showing the exact size and shape of the aneurysm.   MRI scanning is accurate, and is used for certain types of TAA.   An aortic angiogram shows the source of the major blood vessels arising from the aorta. It reveals the size and extent of any aneurysm. It can also show a clot clinging to the wall of the aneurysm (mural thrombus). The angiogram may give information about a tear of the aorta.  TREATMENT  Treatment for a  thoracic aortic aneurysm depends on:  Location.   Size.   Other factors.   Rate of growth.   Underlying cause.  Medical treatment is used for smaller or complicated aneurysms, or those that do not cause symptoms. These include:  Stopping smoking.   Blood pressure control.   Control of cholesterol.  Surgical treatment is used for aneurysms that cause symptoms, or for those that are large or growing in size. The surgical technique depends on the location of the  aneurysm. HOME CARE INSTRUCTIONS   If you smoke, stop. If you do not, do not start.   Take all medications as prescribed.   Your caregiver will tell you when to have your aneurysm rechecked, either by echocardiogram or CT scan. Be sure to keep this and all follow-up appointments.  SEEK MEDICAL CARE IF:   You develop mild pain in your chest, upper back, sides, or abdomen.   You develop cough, hoarseness or trouble swallowing.  SEEK IMMEDIATE MEDICAL CARE IF:   You develop severe chest or abdominal pain, or severe pain moving (radiating) to your back.   You suddenly develop cold or blue toes or feet.   You suddenly develop lightheadedness or fainting spells.   You develop trouble breathing.  Document Released: 07/01/2005 Document Revised: 06/20/2011 Document Reviewed: 05/20/2007 [facility] Patient Information 2012 [place].

## 2011-10-01 NOTE — Progress Notes (Signed)
[address]            [place] [address]          [phone]      [name] [name] [place] Medical Record #[id number] Date of Birth: 03-May-1931  Referring: Burtis Junes, NP Primary Care: [name], MD, MD  Chief Complaint:    Chief Complaint  Patient presents with  . Thoracic Aortic Aneurysm    Referral from Dr Caryl Comes for eval on Thoracic Aortic Aneurysm, CTA Chest 08/13/11    History of Present Illness:    Patient is a 78 year old female with a history of atrial fib on Coumadin. On June 14 a pacemaker was placed. In followup an echocardiogram was performed that demonstrated dilated descending aorta. Further evaluation with a CT scan of the chest was performed. Patient's referred for evaluation of dilated descending aorta. She has no history of aortic valve disease.      Current Activity/ Functional Status: Patient is independent with mobility/ambulation, transfers, ADL's, IADL's.   Past Medical History  Diagnosis Date  . PAF (paroxysmal atrial fibrillation)     Previously on flecanide  . Bradycardia     s/p PTVP Jan 2014  . HTN (hypertension)   . CAD (coronary artery disease)   . NASH (nonalcoholic steatohepatitis)   . PMR (polymyalgia rheumatica)     no steriods for 5 years  . HLD (hyperlipidemia)   . Shortness of breath   . Neuromuscular disorder   . Hepatitis     hx of medication induced hepatitis  . Ascending aorta enlargement     4.8 cm per echo 08/07/11; 5.0 by CT in Jan 2013    Past Surgical History  Procedure Date  . Umbilical hernia repair 2585'I  . Incision and drainage breast abscess 1973  . Cardiac catheterization   . Hernia repair   . Pacemaker insertion Jan 2013    Family History  Problem Relation Age of Onset  . Coronary artery disease    . Alzheimer's disease        History  Smoking status  . Former Smoker  . Types: Cigarettes  . Quit date: 07/16/1990  Smokeless tobacco  . Never Used    History  Alcohol Use No     Allergies  Allergen Reactions  . Codeine     REACTION: feel funny  . Penicillins     REACTION: anaphylaxis  . Statins     REACTION: elevated LFT's  . Tylenol (Acetaminophen) Other (See Comments)    If taken with Vytorin at risk for liver damage    Current Outpatient Prescriptions  Medication Sig Dispense Refill  . acebutolol (SECTRAL) 200 MG capsule Take 1 capsule (200 mg total) by mouth 2 (two) times daily.  60 capsule  6  . Calcium Carbonate-Vitamin D (CALCIUM + D) 600-200 MG-UNIT TABS Take by mouth. 1 BID       . Cholecalciferol (VITAMIN D3) 1000 UNITS tablet Take 1,000 Units by mouth daily.        . furosemide (LASIX) 20 MG tablet Take 1 tablet (20 mg total) by mouth daily.  30 tablet  6  . hydrochlorothiazide (MICROZIDE) 12.5 MG capsule Take 1 capsule (12.5 mg total) by mouth daily.  30 capsule  6  . multivitamin (THERAGRAN) per tablet Take 1 tablet by mouth daily.        . potassium chloride  SA (K-DUR,KLOR-CON) 20 MEQ tablet Take 1 tablet (20 mEq total) by mouth daily.  30 tablet  6  . valsartan (DIOVAN) 320 MG tablet Take 1 tablet (320 mg total) by mouth daily.  30 tablet  11  . warfarin (COUMADIN) 2.5 MG tablet Take as directed by Anticoagulation clinic..  60 tablet  3       Review of Systems:     Cardiac Review of Systems: Y or N  Chest Pain [ n   ]  Resting SOB [   ] Exertional SOB  [n  ]  Orthopnea Florencio.Farrier ]   Pedal Edema [ n  ]    Palpitations Blue.Reese  ] Syncope  Florencio.Farrier  ]   Presyncope Florencio.Farrier   ]  General Review of Systems: [Y] = yes [  ]=no Constitional: recent weight change [  ]; anorexia [  ]; fatigue [  ]; nausea [  ]; night sweats [  ]; fever [  ]; or chills [  ];                                                                                                                                          Dental: poor dentition[  n]; Last Dentist visit: regular every 6 months  Eye : blurred vision [  ]; diplopia [   ]; vision changes [  ];  Amaurosis  fugax[  ]; Resp: cough [  ];  wheezing[  ];  hemoptysis[  ]; shortness of breath[  ]; paroxysmal nocturnal dyspnea[  ]; dyspnea on exertion[  ]; or orthopnea[  ];  GI:  gallstones[  ], vomiting[  ];  dysphagia[  ]; melena[  ];  hematochezia [  ]; heartburn[  ];   Hx of  Colonoscopy[  ]; GU: kidney stones [  ]; hematuria[  ];   dysuria [  ];  nocturia[  ];  history of     obstruction [  ];             Skin: rash, swelling[  ];, hair loss[  ];  peripheral edema[  ];  or itching[  ]; Musculosketetal: myalgias[ y ];  joint swelling[ y ];  joint erythema[  ];  joint pain[  ];  back pain[  ];  Heme/Lymph: bruising[ n ];  bleeding[  n];  anemia[  ];  Neuro: TIA[  ];  headaches[  ];  stroke[  ];  vertigo[  ];  seizures[  ];   paresthesias[  ];  difficulty walking[  ];  Psych:depression[  ]; anxiety[  ];  Endocrine: diabetes[ n ];  thyroid dysfunction[ n ];  Immunizations: Flu Blue.Reese  ]; Pneumococcal[y  ];  Other:  Physical Exam: BP 144/92  Pulse 76  Resp 18  Ht 5' 6"  (1.676 m)  Wt 175 lb (79.379 kg)  BMI 28.25 kg/m2  SpO2 98%  General  appearance: alert, cooperative, appears stated age and no distress Neurologic: intact Heart: irregularly irregular rhythm Lungs: clear to auscultation bilaterally Abdomen: soft, non-tender; bowel sounds normal; no masses,  no organomegaly Extremities: extremities normal, atraumatic, no cyanosis or edema and Homans sign is negative, no sign of DVT   Diagnostic Studies & Laboratory data:     Recent Radiology Findings:  Clinical Data: Shortness of breath post pacemaker placement.  CT ANGIOGRAPHY CHEST  Technique: Multidetector CT imaging of the chest using the  standard protocol during bolus administration of intravenous  contrast. Multiplanar reconstructed images including MIPs were  obtained and reviewed to evaluate the vascular anatomy.  Contrast: 1104m OMNIPAQUE IOHEXOL 300 MG/ML IV SOLN  Comparison: None.  Findings: There is good contrast opacification  of the pulmonary  artery branches. No discrete filling defect to suggest acute  PE.There is fusiform dilatation of the ascending aorta,  measuring 3 cm diameter at the sinotubular junction, 5 cm in its  midportion, 3.9 cm distally. Proximal arch 3.8 cm diameter, distal  arch 3.1 cm, proximal descending 3.1 cm, tapering to a diameter of  2.5 cm above the diaphragm. No dissection. Scattered atheromatous  calcifications throughout the thoracic aorta. There is classic  three-vessel brachiocephalic arterial arch anatomy without proximal  stenosis. No significant mural thrombus.  No mediastinal hematoma. No pleural or pericardial effusion. No  hilar or mediastinal adenopathy. Sub centimeter AP window and  precarinal nodes are noted. Left subclavian transvenous pacemaker  leads extend into the right atrium and to the right ventricular  apex. No pneumothorax. Minimal linear scarring or subsegmental  atelectasis in the posterior basal segment right lower lobe and  inferior lingula. Lungs otherwise clear. Visualized portions of  upper abdomen unremarkable. Minimal spondylitic changes in the mid  thoracic spine.  IMPRESSION:  1. Negative for acute PE.  2. 5 cm fusiform ascending aortic aneurysm without complicating  features.  Original Report Authenticated By: DTrecia Rogers M.D.      I have reviewed films and measure aorta maxi um at 4.8 cm    ECHO: Transthoracic Echocardiography  Patient: WSharayah, RenfrowMR #: [id number] Date: 08/07/2011 Gender: F Age: [age]Height: 167.6cm Weight: 78.5kg BSA: 1.832m Pt. Status: Room:  SONOGRAPHER PaCharlann NossTTENDING DaLoralie ChampagneMD PERFORMING [facility] ORDERING KlVirl AxeECarney Livingc: Dr. JaJani Gravel------------------------------------------------------------ LV EF: 55% - 60%  ------------------------------------------------------------ Indications: Atrial fibrillation -  427.31.  ------------------------------------------------------------ History: PMH: Acquired from the patient and from the patient's chart. Atrial fibrillation. Risk factors: Hypertension. Dyslipidemia.  ------------------------------------------------------------ Study Conclusions  - Left ventricle: The cavity size was normal. Wall thickness was normal. Systolic function was normal. The estimated ejection fraction was 55%. Although no diagnostic regional wall motion abnormality was identified, this possibility cannot be completely excluded on the basis of this study. Features are consistent with a pseudonormal left ventricular filling pattern, with concomitant abnormal relaxation and increased filling pressure (grade 2 diastolic dysfunction). - Aortic valve: There was no stenosis. - Aorta: Normal aortic root size. Ascending aortic aneurysm reaching 4.8 cm. - Mitral valve: Mild regurgitation. - Left atrium: The atrium was moderately dilated. - Right ventricle: The cavity size was normal. Pacer wire or catheter noted in right ventricle. Systolic function was normal. - Right atrium: The atrium was mildly dilated. - Tricuspid valve: Peak RV-RA gradient:2127mg (S). - Pulmonary arteries: PA systolic pressure 27-97-67Hg. - Systemic veins: IVC measured 2.0 cm with normal respirophasic variation, suggesting RA pressure 6-10  mmHg. - Pericardium, extracardiac: A trivial pericardial effusion was identified. Impressions:  - Normal LV size and systolic function, EF 56%. Moderate diastolic dysfunction. Biatrial enlargement. Normal RV size and systolic function. Mild mitral regurgitation. Ascending aortic aneurysm reaching 4.8 cm by echo. Would consider MRA chest to fully assess thoracic aorta. Transthoracic echocardiography. M-mode, complete 2D, spectral Doppler, and color Doppler. Height: Height: 167.6cm. Height: 66in. Weight: Weight: 78.5kg. Weight: 172.6lb. Body mass index: BMI:  27.9kg/m^2. Body surface area: BSA: 1.86m2. Blood pressure: 164/83. Patient status: Outpatient. Location: [facility]  ------------------------------------------------------------  ------------------------------------------------------------ Left ventricle: The cavity size was normal. Wall thickness was normal. Systolic function was normal. The estimated ejection fraction was 55%. Although no diagnostic regional wall motion abnormality was identified, this possibility cannot be completely excluded on the basis of this study. Features are consistent with a pseudonormal left ventricular filling pattern, with concomitant abnormal relaxation and increased filling pressure (grade 2 diastolic dysfunction).  ------------------------------------------------------------ Aortic valve: Trileaflet; mildly calcified leaflets. Doppler: There was no stenosis. No regurgitation.  ------------------------------------------------------------ Aorta: Normal aortic root size. Ascending aortic aneurysm reaching 4.8 cm.  ------------------------------------------------------------ Mitral valve: Doppler: There was no evidence for stenosis. Mild regurgitation.  ------------------------------------------------------------ Left atrium: The atrium was moderately dilated.  ------------------------------------------------------------ Right ventricle: The cavity size was normal. Pacer wire or catheter noted in right ventricle. Systolic function was normal.  ------------------------------------------------------------ Pulmonic valve: Structurally normal valve. Cusp separation was normal. Doppler: Transvalvular velocity was within the normal range. No regurgitation.  ------------------------------------------------------------ Tricuspid valve: Doppler: Trivial regurgitation.  ------------------------------------------------------------ Pulmonary artery: PA systolic pressure 231-49 mmHg.  ------------------------------------------------------------ Right atrium: The atrium was mildly dilated.  ------------------------------------------------------------ Pericardium: A trivial pericardial effusion was identified.  ------------------------------------------------------------ Systemic veins: IVC measured 2.0 cm with normal respirophasic variation, suggesting RA pressure 6-10 mmHg.  ------------------------------------------------------------ Post procedure conclusions Ascending Aorta:  - Normal aortic root size. Ascending aortic aneurysm reaching 4.8 cm.  ------------------------------------------------------------  2D measurements Normal Doppler measurements Norma Left ventricle l LVID ED, 44.3 mm 43-52 Left ventricle chord, Ea, lat 8.6 cm/s ----- PLAX ann, tiss 6 LVID ES, 30.9 mm 23-38 DP chord, E/Ea, lat 6.6 ----- PLAX ann, tiss 7 FS, chord, 30 % >29 DP PLAX Ea, med 4.9 cm/s ----- LVPW, ED 8.92 mm ------ ann, tiss 3 IVS/LVPW 0.88 <1.3 DP ratio, ED E/Ea, med 11. ----- Ventricular septum ann, tiss 72 IVS, ED 7.85 mm ------ DP LVOT LVOT Diam, S 21 mm ------ Peak vel, 103 cm/s ----- Area 3.46 cm^2 ------ S Diam 21 mm ------ VTI, S 25. cm ----- Aorta 8 Root diam, 32 mm ------ HR 60 bpm ----- ED Stroke 89. ml ----- Left atrium vol 4 AP dim 2.23 cm/m^2 <2.2 Cardiac 5.4 L/min ----- index output Cardiac 2.9 L/(min-m ----- index ^2) Stroke 47. ml/m^2 ----- index 5 Mitral valve Peak E 57. cm/s ----- vel 8 Peak A 54. cm/s ----- vel 8 Decelerat 391 ms 150-2 ion time 30 Peak E/A 1.1 ----- ratio Tricuspid valve Peak 19 mm Hg ----- RV-RA gradient, S  ------------------------------------------------------------ Prepared and Electronically Authenticated by  DLoralie Champagne MD 2013-01-23T20:42:[id number]   Recent Lab Findings: Lab Results  Component Value Date   WBC 6.1 07/24/2011   HGB 12.8 07/24/2011   HCT 38.5 07/24/2011   PLT 227.0 07/24/2011    GLUCOSE 106* 08/12/2011   ALT 94* 12/27/2010   AST 67* 12/27/2010   NA 140 08/12/2011   K 3.7 08/12/2011   CL 102 08/12/2011   CREATININE 1.1 08/12/2011   BUN 21  08/12/2011   CO2 30 08/12/2011   TSH 0.933 12/27/2010   INR 2.2 09/09/2011      Assessment / Plan:     I have reviewed the patient's films echocardiogram and CT scan, she does have a dilated ascending aorta approximately 4.8 cm maximum diameter without evidence of aortic valve disease. At this point with her age of 60 years and a aorta measuring approximately 4.8 cm I would not recommend elective repair of her descending aorta. I've discussed with her the risks of dissection and consequences of that versus the risk of elective repair. She understands a good blood pressure control including beta blockers important. We'll plan to see her back in approximately 6 months with a followup CTA of the aorta to evaluate any change in size.    Grace Isaac MD  Beeper [phone] Office [phone] 10/01/2011 4:59 PM

## 2011-10-07 ENCOUNTER — Ambulatory Visit (INDEPENDENT_AMBULATORY_CARE_PROVIDER_SITE_OTHER): Payer: Medicare Other

## 2011-10-07 DIAGNOSIS — I4891 Unspecified atrial fibrillation: Secondary | ICD-10-CM

## 2011-10-07 DIAGNOSIS — Z7901 Long term (current) use of anticoagulants: Secondary | ICD-10-CM

## 2011-10-07 LAB — POCT INR: INR: 2.1

## 2011-10-14 ENCOUNTER — Telehealth: Payer: Self-pay | Admitting: Internal Medicine

## 2011-10-14 NOTE — Telephone Encounter (Signed)
LMTCB ./CY 

## 2011-10-14 NOTE — Telephone Encounter (Signed)
Spoke with pt, aware of lori gerhadt's recommendations.

## 2011-10-14 NOTE — Telephone Encounter (Signed)
Fu call Pt returning your call

## 2011-10-14 NOTE — Telephone Encounter (Signed)
These readings look great. No change in her current regimen. She should continue to keep a BP diary and bring in at her next appointment for review.

## 2011-10-14 NOTE — Telephone Encounter (Signed)
Spoke with pt, [name] gerhardt, np asked pt to check her bp and report how it runs at home. She reports bp ranging from 103-129/65-77. Will forward for [name]'s review.

## 2011-10-14 NOTE — Telephone Encounter (Signed)
Pt calling re keeping track of BP for two weeks and needs to report it to [facility]

## 2011-10-30 ENCOUNTER — Telehealth: Payer: Self-pay | Admitting: *Deleted

## 2011-10-30 NOTE — Telephone Encounter (Signed)
Patient missed her coumadin dose last night, due to her last INR of 2.1 on 2-3 range 10/07/11, instructed her to take her normal dose today of 5 mg and then tomorrow take 5 mg instead of 2.5 mg, she states she understands. Instructed to call for any problems.

## 2011-11-04 ENCOUNTER — Ambulatory Visit (INDEPENDENT_AMBULATORY_CARE_PROVIDER_SITE_OTHER): Payer: Medicare Other

## 2011-11-04 DIAGNOSIS — I4891 Unspecified atrial fibrillation: Secondary | ICD-10-CM

## 2011-11-04 DIAGNOSIS — Z7901 Long term (current) use of anticoagulants: Secondary | ICD-10-CM

## 2011-11-11 ENCOUNTER — Encounter: Payer: Self-pay | Admitting: *Deleted

## 2011-11-12 ENCOUNTER — Encounter: Payer: Self-pay | Admitting: Internal Medicine

## 2011-11-12 ENCOUNTER — Ambulatory Visit (INDEPENDENT_AMBULATORY_CARE_PROVIDER_SITE_OTHER): Payer: Medicare Other | Admitting: Internal Medicine

## 2011-11-12 VITALS — BP 116/74 | HR 75 | Ht 66.5 in | Wt 176.0 lb

## 2011-11-12 DIAGNOSIS — Z950 Presence of cardiac pacemaker: Secondary | ICD-10-CM

## 2011-11-12 DIAGNOSIS — I495 Sick sinus syndrome: Secondary | ICD-10-CM

## 2011-11-12 DIAGNOSIS — I4891 Unspecified atrial fibrillation: Secondary | ICD-10-CM

## 2011-11-12 DIAGNOSIS — I10 Essential (primary) hypertension: Secondary | ICD-10-CM

## 2011-11-12 HISTORY — DX: Presence of cardiac pacemaker: Z95.0

## 2011-11-12 LAB — PACEMAKER DEVICE OBSERVATION
AL AMPLITUDE: 5.6 mv
AL IMPEDENCE PM: 556 Ohm
ATRIAL PACING PM: 93
RV LEAD IMPEDENCE PM: 908 Ohm
RV LEAD THRESHOLD: 0.5 V
VENTRICULAR PACING PM: 1

## 2011-11-12 MED ORDER — METOPROLOL SUCCINATE ER 50 MG PO TB24: 50.0000 mg | ORAL_TABLET | Freq: Every day | ORAL | Status: DC

## 2011-11-12 MED ORDER — ATENOLOL 50 MG PO TABS: 50.0000 mg | ORAL_TABLET | Freq: Every day | ORAL | Status: DC

## 2011-11-12 MED ORDER — NADOLOL 20 MG PO TABS: 20.0000 mg | ORAL_TABLET | Freq: Every day | ORAL | Status: DC

## 2011-11-12 NOTE — Patient Instructions (Addendum)
Your physician has recommended you make the following change in your medication:  1) Stop acebutolol. - you are being given prescriptions for 3 different beta blockers to try. You may try these in any order. Please try to give at least 2 weeks on each drug to see how you tolerate them.  DO NOT take more than medication at a time. 2) atenolol 50 mg once daily. 3) metoprolol succinate 50 mg once daily. 4) corgard 20 mg once daily.  Your physician recommends that you schedule a follow-up appointment in: 3 months with Dr. Caryl Comes.

## 2011-11-12 NOTE — Assessment & Plan Note (Signed)
The patient's device was interrogated and the information was fully reviewed.  The device was reprogrammed to  [facility] longevity

## 2011-11-12 NOTE — Assessment & Plan Note (Signed)
She is now 100% atrially paced. She feels better.

## 2011-11-12 NOTE — Progress Notes (Signed)
  HPI  [name] [name] is a 78 y.o. female S/p Pacer for pace termination pauses  She has PAF was prev managed with AAD but now w rate control  She also has an aortic root enlargement followed by Dr Gerilyn Pilgrim; she is to retain betablocker therapy  She has had periodic episodes of atrial fibrillation which remained quite disruptive.  Overall however, her energy is improved. Her  Past Medical History  Diagnosis Date  . PAF (paroxysmal atrial fibrillation)     Previously on flecanide  . Bradycardia     s/p PTVP Jan 2014  . HTN (hypertension)   . CAD (coronary artery disease)   . NASH (nonalcoholic steatohepatitis)   . PMR (polymyalgia rheumatica)     no steriods for 5 years  . HLD (hyperlipidemia)   . Shortness of breath   . Neuromuscular disorder   . Hepatitis     hx of medication induced hepatitis  . Ascending aorta enlargement     4.8 cm per echo 08/07/11; 5.0 by CT in Jan 2013    Past Surgical History  Procedure Date  . Umbilical hernia repair 2353'I  . Incision and drainage breast abscess 1973  . Cardiac catheterization   . Hernia repair   . Pacemaker insertion Jan 2013    Current Outpatient Prescriptions  Medication Sig Dispense Refill  . acebutolol (SECTRAL) 200 MG capsule Take 200 mg by mouth daily.      . Calcium Carbonate-Vitamin D (CALCIUM + D) 600-200 MG-UNIT TABS Take by mouth. 1 BID       . Cholecalciferol (VITAMIN D3) 1000 UNITS tablet Take 1,000 Units by mouth daily.        . furosemide (LASIX) 20 MG tablet Take 1 tablet (20 mg total) by mouth daily.  30 tablet  6  . hydrochlorothiazide (MICROZIDE) 12.5 MG capsule Take 1 capsule (12.5 mg total) by mouth daily.  30 capsule  6  . multivitamin (THERAGRAN) per tablet Take 1 tablet by mouth daily.        . potassium chloride SA (K-DUR,KLOR-CON) 20 MEQ tablet Take 1 tablet (20 mEq total) by mouth daily.  30 tablet  6  . valsartan (DIOVAN) 320 MG tablet Take 1 tablet (320 mg total) by mouth daily.  30 tablet  11  .  warfarin (COUMADIN) 2.5 MG tablet Take as directed by Anticoagulation clinic..  60 tablet  3  . DISCONTD: acebutolol (SECTRAL) 200 MG capsule Take 1 capsule (200 mg total) by mouth 2 (two) times daily.  60 capsule  6    Allergies  Allergen Reactions  . Codeine     REACTION: feel funny  . Penicillins     REACTION: anaphylaxis  . Statins     REACTION: elevated LFT's  . Tylenol (Acetaminophen) Other (See Comments)    If taken with Vytorin at risk for liver damage    Review of Systems negative except from HPI and PMH  Physical Exam BP 116/74  Pulse 75  Ht 5' 6.5" (1.689 m)  Wt 176 lb (79.833 kg)  BMI 27.98 kg/m2 Well developed and well nourished in no acute distress HENT normal E scleral and icterus clear Neck Supple JVP flat; carotids brisk and full Clear to ausculation Regular rate and rhythm, 2/6 systolic murmur  Soft with active bowel sounds No clubbing cyanosis none Edema Alert and oriented, grossly normal motor and sensory function Skin Warm and Dry   Assessment and  Plan

## 2011-11-12 NOTE — Assessment & Plan Note (Signed)
Well controlled 

## 2011-11-12 NOTE — Assessment & Plan Note (Signed)
Her symptoms remain quite disruptive. Now that we have backup bradycardia pacing, we can take a more aggressive approach of rate control and see if we cannot improve the situation.  To this end we will change her so we change her ISA blocker to a standard beta blocker and I have given her prescriptions for atenolol, metoprolol succinate, and Corgard. She'll take them sequentially and see if we can find one that she tolerates well. She does have alopecia. She continues to be symptomatic despite adequate rate control, consideration can be re given to rhythm control either by way of drugs or by ablation

## 2011-11-12 NOTE — Assessment & Plan Note (Signed)
We will continue her on beta blockers as we stop her acebutolol  See bleow

## 2011-11-25 ENCOUNTER — Ambulatory Visit (INDEPENDENT_AMBULATORY_CARE_PROVIDER_SITE_OTHER): Payer: Medicare Other | Admitting: *Deleted

## 2011-11-25 DIAGNOSIS — I4891 Unspecified atrial fibrillation: Secondary | ICD-10-CM

## 2011-11-25 DIAGNOSIS — Z7901 Long term (current) use of anticoagulants: Secondary | ICD-10-CM

## 2011-11-25 LAB — POCT INR: INR: 1.9

## 2011-11-26 ENCOUNTER — Telehealth: Payer: Self-pay | Admitting: Internal Medicine

## 2011-11-26 DIAGNOSIS — I495 Sick sinus syndrome: Secondary | ICD-10-CM

## 2011-11-26 DIAGNOSIS — I4891 Unspecified atrial fibrillation: Secondary | ICD-10-CM

## 2011-11-26 MED ORDER — ATENOLOL 50 MG PO TABS: 50.0000 mg | ORAL_TABLET | Freq: Every day | ORAL | Status: DC

## 2011-11-26 NOTE — Telephone Encounter (Signed)
Patient return call to patient regarding drug sample and selection of medication. She can be reached at [phone].

## 2011-11-26 NOTE — Telephone Encounter (Signed)
I left a message for the patient to call. 

## 2011-11-26 NOTE — Telephone Encounter (Signed)
I spoke with the patient. She states she started taking the atenolol as the first beta blocker in the trial of drugs Dr. Caryl Comes gave her. She states she feels good on this. I explained I would go ahead and refill this for her. She is agreeable. She will hold on to the other 2 RX for beta blockers that was given to her in case she needs to try these at some point.

## 2011-12-11 ENCOUNTER — Ambulatory Visit (INDEPENDENT_AMBULATORY_CARE_PROVIDER_SITE_OTHER): Payer: Medicare Other | Admitting: *Deleted

## 2011-12-11 DIAGNOSIS — I4891 Unspecified atrial fibrillation: Secondary | ICD-10-CM

## 2011-12-11 DIAGNOSIS — Z7901 Long term (current) use of anticoagulants: Secondary | ICD-10-CM

## 2011-12-11 LAB — POCT INR: INR: 2.8

## 2011-12-30 ENCOUNTER — Ambulatory Visit (INDEPENDENT_AMBULATORY_CARE_PROVIDER_SITE_OTHER): Payer: Medicare Other | Admitting: *Deleted

## 2011-12-30 DIAGNOSIS — Z7901 Long term (current) use of anticoagulants: Secondary | ICD-10-CM

## 2011-12-30 DIAGNOSIS — I4891 Unspecified atrial fibrillation: Secondary | ICD-10-CM

## 2011-12-30 LAB — POCT INR: INR: 4.2

## 2012-01-02 ENCOUNTER — Other Ambulatory Visit: Payer: Self-pay | Admitting: Cardiothoracic Surgery

## 2012-01-02 DIAGNOSIS — I712 Thoracic aortic aneurysm, without rupture: Secondary | ICD-10-CM

## 2012-01-13 ENCOUNTER — Ambulatory Visit (INDEPENDENT_AMBULATORY_CARE_PROVIDER_SITE_OTHER): Payer: Medicare Other | Admitting: *Deleted

## 2012-01-13 DIAGNOSIS — I4891 Unspecified atrial fibrillation: Secondary | ICD-10-CM

## 2012-01-13 DIAGNOSIS — Z7901 Long term (current) use of anticoagulants: Secondary | ICD-10-CM

## 2012-01-13 LAB — POCT INR: INR: 3.1

## 2012-01-22 ENCOUNTER — Telehealth: Payer: Self-pay | Admitting: Internal Medicine

## 2012-01-22 NOTE — Telephone Encounter (Signed)
New Problem:    PAtient missed on of her doses and would like to know how to make it up.  Please clal back.

## 2012-01-22 NOTE — Telephone Encounter (Signed)
Telephoned back and discussed missed dose, dose adjustment given to have pt take 2 tabs on Thursday instead of 1 tablet. Keep regular appt.

## 2012-01-24 ENCOUNTER — Other Ambulatory Visit: Payer: Self-pay | Admitting: Cardiothoracic Surgery

## 2012-01-24 DIAGNOSIS — I712 Thoracic aortic aneurysm, without rupture: Secondary | ICD-10-CM

## 2012-01-24 LAB — BUN: BUN: 25 mg/dL — ABNORMAL HIGH (ref 6–23)

## 2012-01-25 LAB — CREATININE, SERUM: Creat: 0.91 mg/dL (ref 0.50–1.10)

## 2012-01-27 ENCOUNTER — Other Ambulatory Visit: Payer: Self-pay | Admitting: Internal Medicine

## 2012-01-27 ENCOUNTER — Other Ambulatory Visit: Payer: Self-pay | Admitting: *Deleted

## 2012-01-27 DIAGNOSIS — Z1231 Encounter for screening mammogram for malignant neoplasm of breast: Secondary | ICD-10-CM

## 2012-01-27 MED ORDER — FUROSEMIDE 20 MG PO TABS: 20.0000 mg | ORAL_TABLET | Freq: Every day | ORAL | Status: DC

## 2012-01-28 ENCOUNTER — Ambulatory Visit
Admission: RE | Admit: 2012-01-28 | Discharge: 2012-01-28 | Disposition: A | Discharge status: Home / Self Care | Payer: Medicare Other | Source: Ambulatory Visit | Attending: Cardiothoracic Surgery | Admitting: Cardiothoracic Surgery

## 2012-01-28 DIAGNOSIS — I712 Thoracic aortic aneurysm, without rupture: Secondary | ICD-10-CM

## 2012-01-28 MED ORDER — IOHEXOL 300 MG/ML  SOLN
100.0000 mL | Freq: Once | INTRAMUSCULAR | Status: AC | PRN
  Administered 2012-01-28: 100 mL via INTRAVENOUS

## 2012-01-30 ENCOUNTER — Encounter: Payer: Self-pay | Admitting: Cardiothoracic Surgery

## 2012-01-30 ENCOUNTER — Ambulatory Visit (INDEPENDENT_AMBULATORY_CARE_PROVIDER_SITE_OTHER): Payer: Medicare Other | Admitting: Cardiothoracic Surgery

## 2012-01-30 VITALS — BP 137/76 | HR 83 | Resp 16 | Ht 66.0 in | Wt 170.0 lb

## 2012-01-30 DIAGNOSIS — I712 Thoracic aortic aneurysm, without rupture: Secondary | ICD-10-CM

## 2012-01-30 DIAGNOSIS — Z09 Encounter for follow-up examination after completed treatment for conditions other than malignant neoplasm: Secondary | ICD-10-CM

## 2012-01-30 NOTE — Progress Notes (Signed)
[address]            [place] [address]          [phone]      Amita W Drozdowski [place] Medical Record #[id number] Date of Birth: 05-07-1931  Referring: [name], MD Primary Care: [name], MD  Chief Complaint:    Chief Complaint  Patient presents with  . TAA    4 month f/u with CTA CHEST    History of Present Illness:    Patient is a 78 year old female with a history of atrial fib on Coumadin. On January 14 a pacemaker was placed. In followup an echocardiogram was performed that demonstrated dilated descending aorta. Further evaluation with a CT scan of the chest was performed. Patient's referred for evaluation of dilated descending aorta. She has no history of aortic valve disease.  The patient returns today with a followup CT scan for comparison to the scan done in January of 2013.   She has no family history of aneurysm disease or sudden death, her grandfather did die in his 64s of a cerebral aneurysm.  She has been diligent about blood pressure control and taking her beta blocker.     Current Activity/ Functional Status: Patient is independent with mobility/ambulation, transfers, ADL's, IADL's.   Past Medical History  Diagnosis Date  . PAF (paroxysmal atrial fibrillation)     Previously on flecanide  . Bradycardia     s/p PTVP Jan 2014  . HTN (hypertension)   . CAD (coronary artery disease)   . NASH (nonalcoholic steatohepatitis)   . PMR (polymyalgia rheumatica)     no steriods for 5 years  . HLD (hyperlipidemia)   . Shortness of breath   . Neuromuscular disorder   . Hepatitis     hx of medication induced hepatitis  . Ascending aorta enlargement     4.8 cm per echo 08/07/11; 5.0 by CT in Jan 2013    Past Surgical History  Procedure Date  . Umbilical hernia repair 5643'P  . Incision and drainage breast abscess 1973  . Cardiac catheterization   . Hernia repair   . Pacemaker insertion Jan 2013     Family History  Problem Relation Age of Onset  . Coronary artery disease    . Alzheimer's disease        History  Smoking status  . Former Smoker  . Types: Cigarettes  . Quit date: 07/16/1990  Smokeless tobacco  . Never Used    History  Alcohol Use No     Allergies  Allergen Reactions  . Codeine     REACTION: feel funny  . Penicillins     REACTION: anaphylaxis  . Statins     REACTION: elevated LFT's  . Tylenol (Acetaminophen) Other (See Comments)    If taken with Vytorin at risk for liver damage    Current Outpatient Prescriptions  Medication Sig Dispense Refill  . atenolol (TENORMIN) 50 MG tablet Take 1 tablet (50 mg total) by mouth daily.  30 tablet  6  . Calcium Carbonate-Vitamin D (CALCIUM + D) 600-200 MG-UNIT TABS Take by mouth. 1 BID       . Cholecalciferol (VITAMIN D3) 1000 UNITS tablet Take 1,000 Units by mouth daily.        . furosemide (LASIX) 20 MG tablet Take 1 tablet (20 mg total) by mouth daily.  30 tablet  6  . hydrochlorothiazide (MICROZIDE) 12.5 MG capsule Take 1 capsule (12.5 mg total) by mouth daily.  30 capsule  6  . multivitamin (THERAGRAN) per tablet Take 1 tablet by mouth daily.        . potassium chloride SA (K-DUR,KLOR-CON) 20 MEQ tablet Take 1 tablet (20 mEq total) by mouth daily.  30 tablet  6  . valsartan (DIOVAN) 320 MG tablet Take 1 tablet (320 mg total) by mouth daily.  30 tablet  11  . warfarin (COUMADIN) 2.5 MG tablet Take as directed by Anticoagulation clinic..  60 tablet  3       Review of Systems:     Cardiac Review of Systems: Y or N  Chest Pain [ n   ]  Resting SOB [   ] Exertional SOB  [n  ]  Orthopnea Florencio.Farrier ]   Pedal Edema [ n  ]    Palpitations Blue.Reese  ] Syncope  Florencio.Farrier  ]   Presyncope Florencio.Farrier   ]  General Review of Systems: [Y] = yes [  ]=no Constitional: recent weight change [  ]; anorexia [  ]; fatigue [  ]; nausea [  ]; night sweats [  ]; fever [  ]; or chills [  ];                                                                                                                                           Dental: poor dentition[  n]; Last Dentist visit: regular every 6 months  Eye : blurred vision [  ]; diplopia [   ]; vision changes [  ];  Amaurosis fugax[  ]; Resp: cough [  ];  wheezing[  ];  hemoptysis[  ]; shortness of breath[  ]; paroxysmal nocturnal dyspnea[  ]; dyspnea on exertion[  ]; or orthopnea[  ];  GI:  gallstones[  ], vomiting[  ];  dysphagia[  ]; melena[  ];  hematochezia [  ]; heartburn[  ];   Hx of  Colonoscopy[  ]; GU: kidney stones [  ]; hematuria[  ];   dysuria [  ];  nocturia[  ];  history of     obstruction [  ];             Skin: rash, swelling[  ];, hair loss[  ];  peripheral edema[  ];  or itching[  ]; Musculosketetal: myalgias[ y ];  joint swelling[ y ];  joint erythema[  ];  joint pain[  ];  back pain[  ];  Heme/Lymph: bruising[ n ];  bleeding[  n];  anemia[  ];  Neuro: TIA[  ];  headaches[  ];  stroke[  ];  vertigo[  ];  seizures[  ];   paresthesias[  ];  difficulty walking[  ];  Psych:depression[  ]; anxiety[  ];  Endocrine: diabetes[ n ];  thyroid dysfunction[ n ];  Immunizations: Flu Blue.Reese  ]; Pneumococcal[y  ];  Other:  Physical Exam: BP 137/76  Pulse 83  Resp 16  Ht 5' 6"  (1.676 m)  Wt 170 lb (77.111 kg)  BMI 27.44 kg/m2  SpO2 98%  General appearance: alert, cooperative, appears stated age and no distress Neurologic: intact Heart: irregularly irregular rhythm Lungs: clear to auscultation bilaterally Abdomen: soft, non-tender; bowel sounds normal; no masses,  no organomegaly Extremities: extremities normal, atraumatic, no cyanosis or edema and Homans sign is negative, no sign of DVT   Diagnostic Studies & Laboratory data:     Recent Radiology Findings:  Clinical Data: Shortness of breath post pacemaker placement.  CT ANGIOGRAPHY CHEST  Technique: Multidetector CT imaging of the chest using the  standard protocol during bolus administration of intravenous  contrast. Multiplanar  reconstructed images including MIPs were  obtained and reviewed to evaluate the vascular anatomy.  Contrast: 173m OMNIPAQUE IOHEXOL 300 MG/ML IV SOLN  Comparison: None.  Findings: There is good contrast opacification of the pulmonary  artery branches. No discrete filling defect to suggest acute  PE.There is fusiform dilatation of the ascending aorta,  measuring 3 cm diameter at the sinotubular junction, 5 cm in its  midportion, 3.9 cm distally. Proximal arch 3.8 cm diameter, distal  arch 3.1 cm, proximal descending 3.1 cm, tapering to a diameter of  2.5 cm above the diaphragm. No dissection. Scattered atheromatous  calcifications throughout the thoracic aorta. There is classic  three-vessel brachiocephalic arterial arch anatomy without proximal  stenosis. No significant mural thrombus.  No mediastinal hematoma. No pleural or pericardial effusion. No  hilar or mediastinal adenopathy. Sub centimeter AP window and  precarinal nodes are noted. Left subclavian transvenous pacemaker  leads extend into the right atrium and to the right ventricular  apex. No pneumothorax. Minimal linear scarring or subsegmental  atelectasis in the posterior basal segment right lower lobe and  inferior lingula. Lungs otherwise clear. Visualized portions of  upper abdomen unremarkable. Minimal spondylitic changes in the mid  thoracic spine.  IMPRESSION:  1. Negative for acute PE.  2. 5 cm fusiform ascending aortic aneurysm without complicating  features.  Original Report Authenticated By: DTrecia Rogers M.D.      I have reviewed films and measure aorta maxi um at 4.8 cm    ECHO: Transthoracic Echocardiography  Patient: WSagan, WurzelMR #: [id number] Date: 08/07/2011 Gender: F Age: [age]Height: 167.6cm Weight: 78.5kg BSA: 1.8107m Pt. Status: Room:  SONOGRAPHER PaCharlann NossTTENDING DaLoralie ChampagneMD PERFORMING [facility] PontesSite [address] ORDERING KlVirl AxeECarney Livingc: Dr. JaJani Gravel------------------------------------------------------------ LV EF: 55% - 60%  ------------------------------------------------------------ Indications: Atrial fibrillation - 427.31.  ------------------------------------------------------------ History: PMH: Acquired from the patient and from the patient's chart. Atrial fibrillation. Risk factors: Hypertension. Dyslipidemia.  ------------------------------------------------------------ Study Conclusions  - Left ventricle: The cavity size was normal. Wall thickness was normal. Systolic function was normal. The estimated ejection fraction was 55%. Although no diagnostic regional wall motion abnormality was identified, this possibility cannot be completely excluded on the basis of this study. Features are consistent with a pseudonormal left ventricular filling pattern, with concomitant abnormal relaxation and increased filling pressure (grade 2 diastolic dysfunction). - Aortic valve: There was no stenosis. - Aorta: Normal aortic root size. Ascending aortic aneurysm reaching 4.8 cm. - Mitral valve: Mild regurgitation. - Left atrium: The atrium was moderately dilated. - Right ventricle: The cavity size was normal. Pacer wire or catheter noted in right ventricle. Systolic  function was normal. - Right atrium: The atrium was mildly dilated. - Tricuspid valve: Peak RV-RA gradient:57m Hg (S). - Pulmonary arteries: PA systolic pressure 265-99mmHg. - Systemic veins: IVC measured 2.0 cm with normal respirophasic variation, suggesting RA pressure 6-10 mmHg. - Pericardium, extracardiac: A trivial pericardial effusion was identified. Impressions:  - Normal LV size and systolic function, EF 535% Moderate diastolic dysfunction. Biatrial enlargement. Normal RV size and systolic function. Mild mitral regurgitation. Ascending aortic aneurysm reaching 4.8 cm by echo. Would consider MRA chest to fully assess  thoracic aorta. Transthoracic echocardiography. M-mode, complete 2D, spectral Doppler, and color Doppler. Height: Height: 167.6cm. Height: 66in. Weight: Weight: 78.5kg. Weight: 172.6lb. Body mass index: BMI: 27.9kg/m^2. Body surface area: BSA: 1.852m. Blood pressure: 164/83. Patient status: Outpatient. Location: [facility]  ------------------------------------------------------------  ------------------------------------------------------------ Left ventricle: The cavity size was normal. Wall thickness was normal. Systolic function was normal. The estimated ejection fraction was 55%. Although no diagnostic regional wall motion abnormality was identified, this possibility cannot be completely excluded on the basis of this study. Features are consistent with a pseudonormal left ventricular filling pattern, with concomitant abnormal relaxation and increased filling pressure (grade 2 diastolic dysfunction).  ------------------------------------------------------------ Aortic valve: Trileaflet; mildly calcified leaflets. Doppler: There was no stenosis. No regurgitation.  ------------------------------------------------------------ Aorta: Normal aortic root size. Ascending aortic aneurysm reaching 4.8 cm.  ------------------------------------------------------------ Mitral valve: Doppler: There was no evidence for stenosis. Mild regurgitation.  ------------------------------------------------------------ Left atrium: The atrium was moderately dilated.  ------------------------------------------------------------ Right ventricle: The cavity size was normal. Pacer wire or catheter noted in right ventricle. Systolic function was normal.  ------------------------------------------------------------ Pulmonic valve: Structurally normal valve. Cusp separation was normal. Doppler: Transvalvular velocity was within the normal range. No  regurgitation.  ------------------------------------------------------------ Tricuspid valve: Doppler: Trivial regurgitation.  ------------------------------------------------------------ Pulmonary artery: PA systolic pressure 2770-17mHg.  ------------------------------------------------------------ Right atrium: The atrium was mildly dilated.  ------------------------------------------------------------ Pericardium: A trivial pericardial effusion was identified.  ------------------------------------------------------------ Systemic veins: IVC measured 2.0 cm with normal respirophasic variation, suggesting RA pressure 6-10 mmHg.  ------------------------------------------------------------ Post procedure conclusions Ascending Aorta:  - Normal aortic root size. Ascending aortic aneurysm reaching 4.8 cm.  ------------------------------------------------------------  2D measurements Normal Doppler measurements Norma Left ventricle l LVID ED, 44.3 mm 43-52 Left ventricle chord, Ea, lat 8.6 cm/s ----- PLAX ann, tiss 6 LVID ES, 30.9 mm 23-38 DP chord, E/Ea, lat 6.6 ----- PLAX ann, tiss 7 FS, chord, 30 % >29 DP PLAX Ea, med 4.9 cm/s ----- LVPW, ED 8.92 mm ------ ann, tiss 3 IVS/LVPW 0.88 <1.3 DP ratio, ED E/Ea, med 11. ----- Ventricular septum ann, tiss 72 IVS, ED 7.85 mm ------ DP LVOT LVOT Diam, S 21 mm ------ Peak vel, 103 cm/s ----- Area 3.46 cm^2 ------ S Diam 21 mm ------ VTI, S 25. cm ----- Aorta 8 Root diam, 32 mm ------ HR 60 bpm ----- ED Stroke 89. ml ----- Left atrium vol 4 AP dim 2.23 cm/m^2 <2.2 Cardiac 5.4 L/min ----- index output Cardiac 2.9 L/(min-m ----- index ^2) Stroke 47. ml/m^2 ----- index 5 Mitral valve Peak E 57. cm/s ----- vel 8 Peak A 54. cm/s ----- vel 8 Decelerat 391 ms 150-2 ion time 30 Peak E/A 1.1 ----- ratio Tricuspid valve Peak 19 mm Hg  ----- RV-RA gradient, S  ------------------------------------------------------------ Prepared and Electronically Authenticated by  DaLoralie ChampagneMD 2013-01-23T20:42:[id number]   Followup CT scan of the chest done July 2013 Ct Angio Chest W/cm &/or Wo Cm  01/28/2012  *RADIOLOGY REPORT*  Clinical Data: Follow-up aortic  aneurysm.  CT ANGIOGRAPHY CHEST  Technique:  Multidetector CT imaging of the chest using the standard protocol during bolus administration of intravenous contrast. Multiplanar reconstructed images including MIPs were obtained and reviewed to evaluate the vascular anatomy.  Contrast: 143m OMNIPAQUE IOHEXOL 300 MG/ML  SOLN  Comparison: 08/13/2011  Findings: Again noted is the mild aneurysmal dilatation of the ascending aorta, currently measuring maximally 4.7 cm compared 5.0 cm previously.  No dissection or significant change.  Proximal descending thoracic aorta measures maximally 3.1 cm.  Descending thoracic aorta at the aortic hiatus measures 2.8 cm.  Distal thoracic aortic calcifications noted.  Left-sided pacer remains in place, unchanged.  Coronary artery calcifications present. No mediastinal, hilar, or axillary adenopathy.  Linear scarring in the lung bases.  Lungs otherwise clear.  No pleural effusions.  No suspicious pulmonary nodules or masses.  Visualized chest wall soft tissues are unremarkable.  Imaging into the upper abdomen demonstrates no acute findings.  No acute bony abnormality.  Mild degenerative changes and kyphosis in the thoracic spine.  IMPRESSION: Stable ascending aortic aneurysm, measuring maximally 4.7 cm.  Coronary artery disease.  Original Report Authenticated By: KRaelyn Number M.D.    Recent Lab Findings: Lab Results  Component Value Date   WBC 6.1 07/24/2011   HGB 12.8 07/24/2011   HCT 38.5 07/24/2011   PLT 227.0 07/24/2011   GLUCOSE 106* 08/12/2011   ALT 94* 12/27/2010   AST 67* 12/27/2010   NA 140 08/12/2011   K 3.7 08/12/2011   CL 102 08/12/2011    CREATININE 0.91 01/24/2012   BUN 25* 01/24/2012   CO2 30 08/12/2011   TSH 0.933 12/27/2010   INR 3.1 01/13/2012      Assessment / Plan:     I have reviewed the patient's films echocardiogram and CT scan, she does have a dilated ascending aorta approximately 4.6-4.7  cm maximum diameter without evidence of aortic valve disease. There has been no change over a 6 month interval.  At this point with her age of 89years  I would not recommend elective repair of her  ascending aorta. I've discussed with her the risks of dissection and consequences of that versus the risk of elective repair. She understands a good blood pressure control including beta blockers important. We'll plan to see her back in approximately 8 months with a followup CTA of the aorta to evaluate any change in size.    EGrace IsaacMD  Beeper [phone] [phone] 67797/18/2013 1:49 PM

## 2012-02-03 ENCOUNTER — Ambulatory Visit (INDEPENDENT_AMBULATORY_CARE_PROVIDER_SITE_OTHER): Payer: Medicare Other | Admitting: *Deleted

## 2012-02-03 DIAGNOSIS — Z7901 Long term (current) use of anticoagulants: Secondary | ICD-10-CM

## 2012-02-03 DIAGNOSIS — I4891 Unspecified atrial fibrillation: Secondary | ICD-10-CM

## 2012-02-12 ENCOUNTER — Other Ambulatory Visit: Payer: Self-pay | Admitting: Cardiology

## 2012-02-12 MED ORDER — WARFARIN SODIUM 2.5 MG PO TABS: ORAL_TABLET | ORAL | Status: DC

## 2012-02-17 ENCOUNTER — Telehealth: Payer: Self-pay | Admitting: Internal Medicine

## 2012-02-17 NOTE — Telephone Encounter (Signed)
I spoke with the patient. She states that a week ago this past Saturday, she went in to a-fib. She typically will stay in this about a day and a half. She was out of town at the time and believes that she did stay in a-fib about a week. She states that in the last 2 days, she thinks she may be going in and out of a-fib. She feels ok. Her HR is in the 80-90's. She is currently on the atenolol. She did not try the metoprolol succinate or nadolol that was also given to her back in April as part of her beta blocker trial. She thinks she is in rhythm now. I advised that if she goes back out of rhythm, she should try filling one of the other beta blockers to see how this helps her. I do not think we need to do anything further at this point as she is asymptomatic. She will call me back if she cannot find the other prescriptions. She is agreeable with plan. She has an appointment with Dr. Caryl Comes on 02/25/12.

## 2012-02-17 NOTE — Telephone Encounter (Signed)
New problem:  Patient calling C/O Afib for 10 days.

## 2012-02-21 ENCOUNTER — Ambulatory Visit
Admission: RE | Admit: 2012-02-21 | Discharge: 2012-02-21 | Disposition: A | Discharge status: Home / Self Care | Payer: Medicare Other | Source: Ambulatory Visit | Attending: Internal Medicine | Admitting: Internal Medicine

## 2012-02-21 DIAGNOSIS — Z1231 Encounter for screening mammogram for malignant neoplasm of breast: Secondary | ICD-10-CM

## 2012-02-24 ENCOUNTER — Other Ambulatory Visit: Payer: Self-pay

## 2012-02-24 MED ORDER — POTASSIUM CHLORIDE CRYS ER 20 MEQ PO TBCR: 20.0000 meq | EXTENDED_RELEASE_TABLET | Freq: Every day | ORAL | Status: DC

## 2012-02-24 NOTE — Telephone Encounter (Signed)
..   Requested Prescriptions   Signed Prescriptions Disp Refills  . potassium chloride SA (K-DUR,KLOR-CON) 20 MEQ tablet 30 tablet 6    Sig: Take 1 tablet (20 mEq total) by mouth daily.    Authorizing Provider: Sherren Mocha    Ordering User: Ardis Hughs, Donley Harland Jerilynn Mages

## 2012-02-25 ENCOUNTER — Encounter: Payer: Self-pay | Admitting: *Deleted

## 2012-02-25 ENCOUNTER — Ambulatory Visit (INDEPENDENT_AMBULATORY_CARE_PROVIDER_SITE_OTHER): Payer: Medicare Other | Admitting: Pharmacist

## 2012-02-25 ENCOUNTER — Encounter: Payer: Self-pay | Admitting: Internal Medicine

## 2012-02-25 ENCOUNTER — Ambulatory Visit (INDEPENDENT_AMBULATORY_CARE_PROVIDER_SITE_OTHER): Payer: Medicare Other | Admitting: Internal Medicine

## 2012-02-25 VITALS — BP 102/68 | HR 108 | Ht 66.0 in | Wt 178.4 lb

## 2012-02-25 DIAGNOSIS — Z950 Presence of cardiac pacemaker: Secondary | ICD-10-CM

## 2012-02-25 DIAGNOSIS — Z7901 Long term (current) use of anticoagulants: Secondary | ICD-10-CM

## 2012-02-25 DIAGNOSIS — I4891 Unspecified atrial fibrillation: Secondary | ICD-10-CM

## 2012-02-25 DIAGNOSIS — I5032 Chronic diastolic (congestive) heart failure: Secondary | ICD-10-CM

## 2012-02-25 DIAGNOSIS — I10 Essential (primary) hypertension: Secondary | ICD-10-CM

## 2012-02-25 LAB — PACEMAKER DEVICE OBSERVATION
AL IMPEDENCE PM: 509 Ohm
ATRIAL PACING PM: 88
BATTERY VOLTAGE: 2.79 V
BRDY-0004RV: 120 {beats}/min
RV LEAD IMPEDENCE PM: 850 Ohm
RV LEAD THRESHOLD: 1 V
VENTRICULAR PACING PM: 1

## 2012-02-25 LAB — BASIC METABOLIC PANEL
BUN: 19 mg/dL (ref 6–23)
CO2: 28 mEq/L (ref 19–32)
Calcium: 9.7 mg/dL (ref 8.4–10.5)
GFR: 63.43 mL/min (ref >60.00)
Glucose, Bld: 85 mg/dL (ref 70–99)
Sodium: 138 mEq/L (ref 135–145)

## 2012-02-25 MED ORDER — PROPAFENONE HCL 225 MG PO TABS: ORAL_TABLET | ORAL | Status: DC

## 2012-02-25 MED ORDER — VALSARTAN 160 MG PO TABS: 160.0000 mg | ORAL_TABLET | Freq: Every day | ORAL | Status: DC

## 2012-02-25 NOTE — Assessment & Plan Note (Signed)
Will decrease diovan as we had propafenone

## 2012-02-25 NOTE — Patient Instructions (Addendum)
Your physician has recommended you make the following change in your medication:  1) Decrease diovan to 160 mg once daily. 2) Start Rhythmol 225 mg twice daily.  Your physician recommends that you have lab work today: bmp/magnesium.  Your physician has recommended that you have a Cardioversion (DCCV). Electrical Cardioversion uses a jolt of electricity to your heart either through paddles or wired patches attached to your chest. This is a controlled, usually prescheduled, procedure. Defibrillation is done under light anesthesia in the hospital, and you usually go home the day of the procedure. This is done to get your heart back into a normal rhythm. You are not awake for the procedure. Please see the instruction sheet given to you today.

## 2012-02-25 NOTE — Progress Notes (Signed)
HPI  [name] [name] is a 78 y.o. female S/p Pacer Jan 2013 for pace termination pauses  She has PAF was prev managed with AAD but now w rate control  She reverted to AF a few weeks ago and this was assoc with both palps-intermittent and exercise intolerance   She also has an aortic root enlargement followed by Dr Gerilyn Pilgrim; she is to retain betablocker therapy       Past Medical History  Diagnosis Date  . PAF (paroxysmal atrial fibrillation)     Previously on flecanide  . Bradycardia     s/p PTVP Jan 2014  . HTN (hypertension)   . CAD (coronary artery disease)   . NASH (nonalcoholic steatohepatitis)   . PMR (polymyalgia rheumatica)     no steriods for 5 years  . HLD (hyperlipidemia)   . Shortness of breath   . Neuromuscular disorder   . Hepatitis     hx of medication induced hepatitis  . Ascending aorta enlargement     4.8 cm per echo 08/07/11; 5.0 by CT in Jan 2013    Past Surgical History  Procedure Date  . Umbilical hernia repair 2706'C  . Incision and drainage breast abscess 1973  . Cardiac catheterization   . Hernia repair   . Pacemaker insertion Jan 2013    Current Outpatient Prescriptions  Medication Sig Dispense Refill  . atenolol (TENORMIN) 50 MG tablet Take 1 tablet (50 mg total) by mouth daily.  30 tablet  6  . Calcium Carbonate-Vitamin D (CALCIUM + D) 600-200 MG-UNIT TABS Take by mouth. 1 BID       . Cholecalciferol (VITAMIN D3) 1000 UNITS tablet Take 1,000 Units by mouth daily.        . furosemide (LASIX) 20 MG tablet Take 1 tablet (20 mg total) by mouth daily.  30 tablet  6  . hydrochlorothiazide (MICROZIDE) 12.5 MG capsule Take 1 capsule (12.5 mg total) by mouth daily.  30 capsule  6  . multivitamin (THERAGRAN) per tablet Take 1 tablet by mouth daily.        . potassium chloride SA (K-DUR,KLOR-CON) 20 MEQ tablet Take 1 tablet (20 mEq total) by mouth daily.  30 tablet  6  . valsartan (DIOVAN) 320 MG tablet Take 1 tablet (320 mg total) by mouth daily.  30  tablet  11  . warfarin (COUMADIN) 2.5 MG tablet Take as directed by Anticoagulation clinic..  60 tablet  3    Allergies  Allergen Reactions  . Codeine     REACTION: feel funny  . Penicillins     REACTION: anaphylaxis  . Statins     REACTION: elevated LFT's  . Tylenol (Acetaminophen) Other (See Comments)    If taken with Vytorin at risk for liver damage    Review of Systems negative except from HPI and PMH  Physical Exam BP 102/68  Pulse 108  Ht 5' 6"  (1.676 m)  Wt 178 lb 6.4 oz (80.922 kg)  BMI 28.79 kg/m2 Well developed and well nourished in no acute distress HENT normal E scleral and icterus clear Neck Supple JVP flat; carotids brisk and full Clear to ausculation iregularly irregular 2/6 systolic murmur  Soft with active bowel sounds No clubbing cyanosis none Edema Alert and oriented, grossly normal motor and sensory function Skin Warm and Dry  ECG: AF  @108             Intervals  -/08/42  Axis -10     Assessment and  Plan

## 2012-02-25 NOTE — Assessment & Plan Note (Signed)
The patient's device was interrogated.  The information was reviewed. No changes were made in the programming.    

## 2012-02-25 NOTE — Assessment & Plan Note (Signed)
atrail fibrillation is persisting and we discussed rate vs rhythm control sstrrategies.  She would like to pursue the former,  She had a problem concurrent with flecanide in the past so will try propafenone  INR is 2.7 Will check mg adn BMET  Discussed for more than 50%of 30 minute visist

## 2012-02-25 NOTE — Assessment & Plan Note (Signed)
Acute on chronic CHF with out evidence of sign volume overload, likely related to af and rapid rate

## 2012-02-26 ENCOUNTER — Other Ambulatory Visit: Payer: Self-pay | Admitting: Cardiology

## 2012-02-26 MED ORDER — HYDROCHLOROTHIAZIDE 12.5 MG PO CAPS: 12.5000 mg | ORAL_CAPSULE | Freq: Every day | ORAL | Status: DC

## 2012-02-28 ENCOUNTER — Other Ambulatory Visit: Payer: Self-pay | Admitting: Internal Medicine

## 2012-02-28 DIAGNOSIS — I4891 Unspecified atrial fibrillation: Secondary | ICD-10-CM

## 2012-03-02 ENCOUNTER — Encounter (HOSPITAL_COMMUNITY): Payer: Self-pay | Admitting: *Deleted

## 2012-03-02 ENCOUNTER — Ambulatory Visit (HOSPITAL_COMMUNITY): Payer: Medicare Other | Admitting: *Deleted

## 2012-03-02 ENCOUNTER — Ambulatory Visit (HOSPITAL_COMMUNITY)
Admission: RE | Admit: 2012-03-02 | Discharge: 2012-03-02 | Disposition: A | Discharge status: Home / Self Care | Payer: Medicare Other | Source: Ambulatory Visit | Attending: Internal Medicine | Admitting: Internal Medicine

## 2012-03-02 ENCOUNTER — Encounter (HOSPITAL_COMMUNITY)
Admission: RE | Disposition: A | Discharge status: Home / Self Care | Payer: Self-pay | Source: Ambulatory Visit | Attending: Internal Medicine

## 2012-03-02 DIAGNOSIS — I5032 Chronic diastolic (congestive) heart failure: Secondary | ICD-10-CM | POA: Insufficient documentation

## 2012-03-02 DIAGNOSIS — I10 Essential (primary) hypertension: Secondary | ICD-10-CM | POA: Insufficient documentation

## 2012-03-02 DIAGNOSIS — Z950 Presence of cardiac pacemaker: Secondary | ICD-10-CM | POA: Insufficient documentation

## 2012-03-02 DIAGNOSIS — I4891 Unspecified atrial fibrillation: Secondary | ICD-10-CM

## 2012-03-02 HISTORY — PX: CARDIOVERSION: SHX1299

## 2012-03-02 LAB — BASIC METABOLIC PANEL
Calcium: 9.8 mg/dL (ref 8.4–10.5)
GFR calc Af Amer: 65 mL/min — ABNORMAL LOW (ref >90)
GFR calc non Af Amer: 56 mL/min — ABNORMAL LOW (ref >90)
Glucose, Bld: 100 mg/dL — ABNORMAL HIGH (ref 70–99)
Potassium: 3.3 mEq/L — ABNORMAL LOW (ref 3.5–5.1)
Sodium: 143 mEq/L (ref 135–145)

## 2012-03-02 LAB — PROTIME-INR
INR: 2.53 — ABNORMAL HIGH (ref 0.00–1.49)
Prothrombin Time: 27.7 seconds — ABNORMAL HIGH (ref 11.6–15.2)

## 2012-03-02 SURGERY — CARDIOVERSION
Anesthesia: Monitor Anesthesia Care | Wound class: Clean

## 2012-03-02 MED ORDER — SODIUM CHLORIDE 0.9 % IV SOLN
INTRAVENOUS | Status: AC
  Administered 2012-03-02: 500 mL via INTRAVENOUS

## 2012-03-02 MED ORDER — PROPOFOL 10 MG/ML IV BOLUS
INTRAVENOUS | Status: DC | PRN
  Administered 2012-03-02: 140 mg via INTRAVENOUS

## 2012-03-02 NOTE — Procedures (Signed)
Electrical Cardioversion Procedure Note [name] [name] [id number] 07-31-1930  Procedure: Electrical Cardioversion Indications:  Atrial Fibrillation  Procedure Details Consent: Risks of procedure as well as the alternatives and risks of each were explained to the (patient/caregiver).  Consent for procedure obtained. Time Out: Verified patient identification, verified procedure, site/side was marked, verified correct patient position, special equipment/implants available, medications/allergies/relevent history reviewed, required imaging and test results available.  Performed  Patient placed on cardiac monitor, pulse oximetry, supplemental oxygen as necessary.  Sedation given: Diprovan 150 mg IV administered by anesthesia. Pacer pads placed anterior and posterior chest.  Cardioverted 1 time(s).  Cardioverted at 120J.  Evaluation Findings: Post procedure EKG shows: NSR Complications: None Patient did tolerate procedure well.   Kirk Ruths 03/02/2012, 10:51 AM

## 2012-03-02 NOTE — Anesthesia Preprocedure Evaluation (Signed)
Anesthesia Evaluation  Patient identified by MRN, date of birth, ID band Patient awake    Reviewed: Allergy & Precautions, H&P , NPO status , Patient's Chart, lab work & pertinent test results, reviewed documented beta blocker date and time   Airway Mallampati: II TM Distance: >3 FB Neck ROM: Full    Dental  (+) Teeth Intact and Dental Advisory Given   Pulmonary shortness of breath,          Cardiovascular hypertension, Pt. on medications and Pt. on home beta blockers + CAD + dysrhythmias Atrial Fibrillation + pacemaker     Neuro/Psych    GI/Hepatic (+) Hepatitis -, Unspecified  Endo/Other    Renal/GU      Musculoskeletal   Abdominal   Peds  Hematology   Anesthesia Other Findings   Reproductive/Obstetrics                           Anesthesia Physical Anesthesia Plan  ASA: III  Anesthesia Plan: MAC   Post-op Pain Management:    Induction: Intravenous  Airway Management Planned: Mask  Additional Equipment:   Intra-op Plan:   Post-operative Plan:   Informed Consent: I have reviewed the patients History and Physical, chart, labs and discussed the procedure including the risks, benefits and alternatives for the proposed anesthesia with the patient or authorized representative who has indicated his/her understanding and acceptance.     Plan Discussed with: CRNA, Anesthesiologist and Surgeon  Anesthesia Plan Comments:         Anesthesia Quick Evaluation

## 2012-03-02 NOTE — Transfer of Care (Signed)
Immediate Anesthesia Transfer of Care Note  Patient: [name] [name]  Procedure(s) Performed: Procedure(s) (LRB): CARDIOVERSION (N/A)  Patient Location: PACU and Endoscopy Unit  Anesthesia Type: General  Level of Consciousness: awake, alert  and oriented  Airway & Oxygen Therapy: Patient Spontanous Breathing and Patient connected to nasal cannula oxygen  Post-op Assessment: Report given to PACU RN and Post -op Vital signs reviewed and stable  Post vital signs: Reviewed and stable  Complications: No apparent anesthesia complications

## 2012-03-02 NOTE — Anesthesia Postprocedure Evaluation (Signed)
  Anesthesia Post-op Note  Patient: [name] [name]  Procedure(s) Performed: Procedure(s) (LRB): CARDIOVERSION (N/A)  Patient Location: PACU and Endoscopy Unit  Anesthesia Type: MAC  Level of Consciousness: awake, alert  and oriented  Airway and Oxygen Therapy: Patient Spontanous Breathing and Patient connected to nasal cannula oxygen  Post-op Pain: none  Post-op Assessment: Post-op Vital signs reviewed and Patient's Cardiovascular Status Stable  Post-op Vital Signs: Reviewed and stable  Complications: No apparent anesthesia complications

## 2012-03-03 ENCOUNTER — Encounter (HOSPITAL_COMMUNITY): Payer: Self-pay | Admitting: Cardiology

## 2012-03-03 ENCOUNTER — Telehealth: Payer: Self-pay | Admitting: Physician Assistant

## 2012-03-03 NOTE — Telephone Encounter (Signed)
Pt called because she has been in afib most of the day. Tonight, she noticed her heart rate higher and her BP was higher as well. She was concerned.   Advised pt she could take an extra 1/2 Nadolol tonight. Call back if she gets chest pain, dizziness or SOB.   The office is to call her in am to get her in to be seen.

## 2012-03-03 NOTE — H&P (Signed)
[name] [name]   02/25/2012 10:45 AM Office Visit  MRN: [id number]   Description: 78 year old female  Provider: Virl Axe, MD  Department: [facility]        Referring Provider     Arlyss Repress, MD      Diagnoses     Atrial fibrillation   - Primary    427.31    Atrial fibrillation     427.31    Pacemaker     V45.01    Chronic diastolic heart failure     428.32    Essential hypertension, benign     401.1      Reason for Visit     Follow-up    Device Check         Progress Notes     Virl Axe, MD  02/28/2012  5:41 PM  Signed   HPI   [name] [name] is a 78 y.o. female S/p Pacer Jan 2013 for pace termination pauses   She has PAF was prev managed with AAD but now w rate control   She reverted to AF a few weeks ago and this was assoc with both palps-intermittent and exercise intolerance    She also has an aortic root enlargement followed by Dr Gerilyn Pilgrim; she is to retain betablocker therapy           Past Medical History   Diagnosis  Date   .  PAF (paroxysmal atrial fibrillation)         Previously on flecanide   .  Bradycardia         s/p PTVP Jan 2014   .  HTN (hypertension)     .  CAD (coronary artery disease)     .  NASH (nonalcoholic steatohepatitis)     .  PMR (polymyalgia rheumatica)         no steriods for 5 years   .  HLD (hyperlipidemia)     .  Shortness of breath     .  Neuromuscular disorder     .  Hepatitis         hx of medication induced hepatitis   .  Ascending aorta enlargement         4.8 cm per echo 08/07/11; 5.0 by CT in Jan 2013       Past Surgical History   Procedure  Date   .  Umbilical hernia repair  1950's   .  Incision and drainage breast abscess  1973   .  Cardiac catheterization     .  Hernia repair     .  Pacemaker insertion  Jan 2013       Current Outpatient Prescriptions   Medication  Sig  Dispense  Refill   .  atenolol (TENORMIN) 50 MG tablet  Take 1 tablet (50 mg total) by mouth daily.   30  tablet   6   .  Calcium Carbonate-Vitamin D (CALCIUM + D) 600-200 MG-UNIT TABS  Take by mouth. 1 BID          .  Cholecalciferol (VITAMIN D3) 1000 UNITS tablet  Take 1,000 Units by mouth daily.           .  furosemide (LASIX) 20 MG tablet  Take 1 tablet (20 mg total) by mouth daily.   30 tablet   6   .  hydrochlorothiazide (MICROZIDE) 12.5 MG capsule  Take 1 capsule (12.5 mg total) by mouth daily.  30 capsule   6   .  multivitamin (THERAGRAN) per tablet  Take 1 tablet by mouth daily.           .  potassium chloride SA (K-DUR,KLOR-CON) 20 MEQ tablet  Take 1 tablet (20 mEq total) by mouth daily.   30 tablet   6   .  valsartan (DIOVAN) 320 MG tablet  Take 1 tablet (320 mg total) by mouth daily.   30 tablet   11   .  warfarin (COUMADIN) 2.5 MG tablet  Take as directed by Anticoagulation clinic..   60 tablet   3       Allergies   Allergen  Reactions   .  Codeine         REACTION: feel funny   .  Penicillins         REACTION: anaphylaxis   .  Statins         REACTION: elevated LFT's   .  Tylenol (Acetaminophen)  Other (See Comments)       If taken with Vytorin at risk for liver damage      Review of Systems negative except from HPI and PMH   Physical Exam BP 102/68  Pulse 108  Ht 5' 6"  (1.676 m)  Wt 178 lb 6.4 oz (80.922 kg)  BMI 28.79 kg/m2 Well developed and well nourished in no acute distress HENT normal E scleral and icterus clear Neck Supple JVP flat; carotids brisk and full Clear to ausculation iregularly irregular 2/6 systolic murmur   Soft with active bowel sounds No clubbing cyanosis none Edema Alert and oriented, grossly normal motor and sensory function Skin Warm and Dry   ECG: AF  @108               Intervals  -/08/42             Axis -10                            Assessment and  Plan        atrial fibrillation-paroxysmal - Virl Axe, MD  02/25/2012 11:21 AM  Signed atrail fibrillation is persisting and we discussed rate vs rhythm control  sstrrategies.  She would like to pursue the former,  She had a problem concurrent with flecanide in the past so will try propafenone  INR is 2.7 Will check mg adn BMET   Discussed for more than 50%of 30 minute visist  Pacemaker-Medtronic - Virl Axe, MD  02/25/2012 11:21 AM  Signed The patient's device was interrogated.  The information was reviewed. No changes were made in the programming.       Chronic diastolic heart failure - Virl Axe, MD  02/25/2012 11:21 AM  Signed Acute on chronic CHF with out evidence of sign volume overload, likely related to af and rapid rate  HYPERTENSION, BENIGN - Virl Axe, MD  02/25/2012 11:22 AM  Signed Will decrease diovan as we had propafenone     Electronic signature on 02/25/2012          Vitals - Last Recorded       BP Pulse Ht Wt BMI      102/68 108 5' 6"  (1.676 m) 178 lb 6.4 oz (80.922 kg) 28.79 kg/m2         Orders Placed This Encounter     Basic Metabolic Panel (BMET) [FHL45 Custom]    Magnesium [LAB103 Custom]    Pacemaker Device Observation [  VAS2001 Custom]    EKG 12-Lead [EKG1 Custom]       Results are available for this encounter       Ordered Medications         Disp Refills Start End    valsartan (DIOVAN) 160 MG tablet 30 tablet 6 02/25/2012 02/24/2013    Take 1 tablet (160 mg total) by mouth daily. - Oral    propafenone (RYTHMOL) 225 MG tablet 60 tablet 6 02/25/2012      Take one tablet by mouth twice daily.       Discontinued Medications         Reason for Discontinue    atenolol (TENORMIN) 50 MG tablet Error    valsartan (DIOVAN) 320 MG tablet Dose change       Level of Service     PR OFFICE OUTPATIENT VISIT 40 MINUTES [94585]         All Charges for This Encounter       Code Description Service Date Service Provider Modifiers Quantity    93000 PR ELECTROCARDIOGRAM, COMPLETE 02/25/2012 [name], MD 59 1    [phone] PR PROGRAM EVAL IMPLANTABLE IN PERSN DUAL LD PACER 02/25/2012 [name], MD    1    [phone] PR OFFICE OUTPATIENT VISIT 40 MINUTES 02/25/2012 [name], MD   1    [phone] CHG BASIC METABOLIC PANEL CALCIUM TOTAL 02/25/2012 [name], MD   1    [phone] CHG ASSAY OF MAGNESIUM 02/25/2012 [name], MD   1    [phone] PR COLLECTION VENOUS BLOOD,VENIPUNCTURE 02/25/2012 [name], MD   1    [id number] PR RX CERTIFIED EHR 04/03/1659 [name], MD   1    [phone] PR CURRENT TOBACCO NON-USER 02/25/2012 [name], MD   1      Patient Instructions     Your physician has recommended you make the following change in your medication:   1) Decrease diovan to 160 mg once daily. 2) Start Rhythmol 225 mg twice daily.   Your physician recommends that you have lab work today: bmp/magnesium.   Your physician has recommended that you have a Cardioversion (DCCV). Electrical Cardioversion uses a jolt of electricity to your heart either through paddles or wired patches attached to your chest. This is a controlled, usually prescheduled, procedure. Defibrillation is done under light anesthesia in the hospital, and you usually go home the day of the procedure. This is done to get your heart back into a normal rhythm. You are not awake for the procedure. Please see the instruction sheet given to you today.             Patient Instructions History Recorded          Previous Visit         Provider Department Encounter #    02/24/2012  2:38 PM Virl Axe, MD [place] [id number]

## 2012-03-04 ENCOUNTER — Telehealth: Payer: Self-pay | Admitting: Cardiovascular Disease

## 2012-03-04 ENCOUNTER — Encounter: Payer: Self-pay | Admitting: *Deleted

## 2012-03-04 DIAGNOSIS — I4891 Unspecified atrial fibrillation: Secondary | ICD-10-CM

## 2012-03-04 NOTE — Telephone Encounter (Signed)
New problem:  Patient calling C/O Afib last night. Called the on-call MD.

## 2012-03-04 NOTE — Telephone Encounter (Signed)
The patient has been set up for a DCCV for 83/0 with Dr. Johnsie Cancel at 1:00 pm. She is aware of her instructions.

## 2012-03-04 NOTE — Telephone Encounter (Signed)
I reviewed the patient with Dr. Caryl Comes, he has recommended increasing her rhythmol to 225 mg every 8 hours with plans for a DCCV in 3-4 days. I explained to him that the patient is going out of town Sunday. He states she can have this done when she gets back. Alvis Lemmings, RN, BSN  The patient is aware of Dr. Olin Pia recommendations to increase rhythmol. She verbalizes understanding. She will be gone out of town from Sunday through Thursday. I have explained to her that I will set her up for DCCV for 8/30. She can call and let us know if she converts on her own. Alvis Lemmings, RN, BSN

## 2012-03-04 NOTE — Telephone Encounter (Signed)
I spoke with the patient. She had a DCCV on 8/19. She woke up yesterday morning and had a little flutter feeling in her chest, but states this did not bother her and she felt great until about 3-4 pm yesterday when she went back out of rhythm. Per the patient, she became concerned when her BP went up to  143/99 and her HR was around 125 bpm. She called the PA on call and was instructed to take an extra 1/2 of nadolol last night, which she did about 9:30 pm. She is still out of rhythm now. Her BP this morning around 11:30 am was 99/61 HR- 90. She did take her nadolol and rhythmol this morning. I advised I will review with Dr. Caryl Comes what he would like to do. The patient is supposed to go out of town to [facility] on Sunday and she wants to make sure she will be able to go. I will call her back after reviewing with Dr. Caryl Comes. She is agreeable.

## 2012-03-10 ENCOUNTER — Other Ambulatory Visit: Payer: Self-pay | Admitting: Internal Medicine

## 2012-03-10 MED ORDER — NADOLOL 20 MG PO TABS: 20.0000 mg | ORAL_TABLET | Freq: Every day | ORAL | Status: DC

## 2012-03-12 ENCOUNTER — Telehealth: Payer: Self-pay | Admitting: *Deleted

## 2012-03-12 NOTE — Telephone Encounter (Signed)
I spoke with the patient. She just returned from an out of town trip this afternoon. She will come to our office tomorrow at 11:00 am for a CVRR appointment to check her INR prior to DCCV at 1:00 pm.

## 2012-03-12 NOTE — Telephone Encounter (Signed)
Message also left for [facility] in device clinic to help me arrange for [employer] to be available for DCCV tomorrow.

## 2012-03-12 NOTE — Telephone Encounter (Signed)
I left a message for the patient yesterday to call regarding coming today for an INR check. I spoke with her husband today who states she is coming back in town today and is on her way. I have left a message with him to have her call me when she is back. I explained I need to have her INR checked again before her DCCV on 8/30, but that I could bring her in tomorrow morning. He will have her call me when she is back.

## 2012-03-12 NOTE — Telephone Encounter (Signed)
Follow-up:    Patient returned your call.  Please call back. 

## 2012-03-13 ENCOUNTER — Encounter (HOSPITAL_COMMUNITY): Payer: Self-pay | Admitting: Anesthesiology

## 2012-03-13 ENCOUNTER — Ambulatory Visit (HOSPITAL_COMMUNITY): Payer: Medicare Other | Admitting: Anesthesiology

## 2012-03-13 ENCOUNTER — Ambulatory Visit (INDEPENDENT_AMBULATORY_CARE_PROVIDER_SITE_OTHER): Payer: Medicare Other | Admitting: Pharmacist

## 2012-03-13 ENCOUNTER — Encounter (HOSPITAL_COMMUNITY): Payer: Self-pay | Admitting: *Deleted

## 2012-03-13 ENCOUNTER — Encounter (HOSPITAL_COMMUNITY)
Admission: RE | Disposition: A | Discharge status: Home / Self Care | Payer: Self-pay | Source: Ambulatory Visit | Attending: Cardiovascular Disease

## 2012-03-13 ENCOUNTER — Ambulatory Visit (HOSPITAL_COMMUNITY)
Admission: RE | Admit: 2012-03-13 | Discharge: 2012-03-13 | Disposition: A | Discharge status: Home / Self Care | Payer: Medicare Other | Source: Ambulatory Visit | Attending: Cardiovascular Disease | Admitting: Cardiovascular Disease

## 2012-03-13 DIAGNOSIS — I10 Essential (primary) hypertension: Secondary | ICD-10-CM | POA: Insufficient documentation

## 2012-03-13 DIAGNOSIS — I5032 Chronic diastolic (congestive) heart failure: Secondary | ICD-10-CM | POA: Insufficient documentation

## 2012-03-13 DIAGNOSIS — Z7901 Long term (current) use of anticoagulants: Secondary | ICD-10-CM

## 2012-03-13 DIAGNOSIS — Z472 Encounter for removal of internal fixation device: Secondary | ICD-10-CM | POA: Insufficient documentation

## 2012-03-13 DIAGNOSIS — I4891 Unspecified atrial fibrillation: Secondary | ICD-10-CM

## 2012-03-13 DIAGNOSIS — I509 Heart failure, unspecified: Secondary | ICD-10-CM | POA: Insufficient documentation

## 2012-03-13 HISTORY — PX: CARDIOVERSION: SHX1299

## 2012-03-13 LAB — POCT INR: INR: 4

## 2012-03-13 SURGERY — CARDIOVERSION
Anesthesia: Monitor Anesthesia Care

## 2012-03-13 NOTE — Anesthesia Postprocedure Evaluation (Signed)
  Anesthesia Post-op Note  Patient: [name] [name]  Procedure(s) Performed: Procedure(s) (LRB): CARDIOVERSION (N/A)  Patient Location: PACU and Endoscopy Unit  Anesthesia Type: General  Level of Consciousness: awake and alert   Airway and Oxygen Therapy: Patient Spontanous Breathing  Post-op Pain: none  Post-op Assessment: Post-op Vital signs reviewed, Patient's Cardiovascular Status Stable, Respiratory Function Stable, Patent Airway, No signs of Nausea or vomiting and Pain level controlled  Post-op Vital Signs: stable  Complications: No apparent anesthesia complications

## 2012-03-13 NOTE — Transfer of Care (Signed)
Immediate Anesthesia Transfer of Care Note  Patient: [name] [name]  Procedure(s) Performed: Procedure(s) (LRB): CARDIOVERSION (N/A)  Patient Location: PACU and Endoscopy Unit  Anesthesia Type: General  Level of Consciousness: awake, alert  and oriented  Airway & Oxygen Therapy: Patient Spontanous Breathing and Patient connected to nasal cannula oxygen  Post-op Assessment: Report given to PACU RN, Post -op Vital signs reviewed and stable and Patient moving all extremities X 4  Post vital signs: Reviewed and stable  Complications: No apparent anesthesia complications

## 2012-03-13 NOTE — H&P (View-Only) (Signed)
[name] [name]   02/25/2012 10:45 AM Office Visit  MRN: [id number]   Description: 78 year old female  Provider: Virl Axe, MD  Department: [facility]        Referring Provider     Arlyss Repress, MD      Diagnoses     Atrial fibrillation   - Primary    427.31    Atrial fibrillation     427.31    Pacemaker     V45.01    Chronic diastolic heart failure     428.32    Essential hypertension, benign     401.1      Reason for Visit     Follow-up    Device Check         Progress Notes     Virl Axe, MD  02/28/2012  5:41 PM  Signed   HPI   [name] [name] is a 78 y.o. female S/p Pacer Jan 2013 for pace termination pauses   She has PAF was prev managed with AAD but now w rate control   She reverted to AF a few weeks ago and this was assoc with both palps-intermittent and exercise intolerance    She also has an aortic root enlargement followed by Dr Gerilyn Pilgrim; she is to retain betablocker therapy           Past Medical History   Diagnosis  Date   .  PAF (paroxysmal atrial fibrillation)         Previously on flecanide   .  Bradycardia         s/p PTVP Jan 2014   .  HTN (hypertension)     .  CAD (coronary artery disease)     .  NASH (nonalcoholic steatohepatitis)     .  PMR (polymyalgia rheumatica)         no steriods for 5 years   .  HLD (hyperlipidemia)     .  Shortness of breath     .  Neuromuscular disorder     .  Hepatitis         hx of medication induced hepatitis   .  Ascending aorta enlargement         4.8 cm per echo 08/07/11; 5.0 by CT in Jan 2013       Past Surgical History   Procedure  Date   .  Umbilical hernia repair  1950's   .  Incision and drainage breast abscess  1973   .  Cardiac catheterization     .  Hernia repair     .  Pacemaker insertion  Jan 2013       Current Outpatient Prescriptions   Medication  Sig  Dispense  Refill   .  atenolol (TENORMIN) 50 MG tablet  Take 1 tablet (50 mg total) by mouth daily.   30  tablet   6   .  Calcium Carbonate-Vitamin D (CALCIUM + D) 600-200 MG-UNIT TABS  Take by mouth. 1 BID          .  Cholecalciferol (VITAMIN D3) 1000 UNITS tablet  Take 1,000 Units by mouth daily.           .  furosemide (LASIX) 20 MG tablet  Take 1 tablet (20 mg total) by mouth daily.   30 tablet   6   .  hydrochlorothiazide (MICROZIDE) 12.5 MG capsule  Take 1 capsule (12.5 mg total) by mouth daily.  30 capsule   6   .  multivitamin (THERAGRAN) per tablet  Take 1 tablet by mouth daily.           .  potassium chloride SA (K-DUR,KLOR-CON) 20 MEQ tablet  Take 1 tablet (20 mEq total) by mouth daily.   30 tablet   6   .  valsartan (DIOVAN) 320 MG tablet  Take 1 tablet (320 mg total) by mouth daily.   30 tablet   11   .  warfarin (COUMADIN) 2.5 MG tablet  Take as directed by Anticoagulation clinic..   60 tablet   3       Allergies   Allergen  Reactions   .  Codeine         REACTION: feel funny   .  Penicillins         REACTION: anaphylaxis   .  Statins         REACTION: elevated LFT's   .  Tylenol (Acetaminophen)  Other (See Comments)       If taken with Vytorin at risk for liver damage      Review of Systems negative except from HPI and PMH   Physical Exam BP 102/68  Pulse 108  Ht 5' 6"  (1.676 m)  Wt 178 lb 6.4 oz (80.922 kg)  BMI 28.79 kg/m2 Well developed and well nourished in no acute distress HENT normal E scleral and icterus clear Neck Supple JVP flat; carotids brisk and full Clear to ausculation iregularly irregular 2/6 systolic murmur   Soft with active bowel sounds No clubbing cyanosis none Edema Alert and oriented, grossly normal motor and sensory function Skin Warm and Dry   ECG: AF  @108               Intervals  -/08/42             Axis -10                            Assessment and  Plan        atrial fibrillation-paroxysmal - Virl Axe, MD  02/25/2012 11:21 AM  Signed atrail fibrillation is persisting and we discussed rate vs rhythm control  sstrrategies.  She would like to pursue the former,  She had a problem concurrent with flecanide in the past so will try propafenone  INR is 2.7 Will check mg adn BMET   Discussed for more than 50%of 30 minute visist  Pacemaker-Medtronic - Virl Axe, MD  02/25/2012 11:21 AM  Signed The patient's device was interrogated.  The information was reviewed. No changes were made in the programming.       Chronic diastolic heart failure - Virl Axe, MD  02/25/2012 11:21 AM  Signed Acute on chronic CHF with out evidence of sign volume overload, likely related to af and rapid rate  HYPERTENSION, BENIGN - Virl Axe, MD  02/25/2012 11:22 AM  Signed Will decrease diovan as we had propafenone     Electronic signature on 02/25/2012          Vitals - Last Recorded       BP Pulse Ht Wt BMI      102/68 108 5' 6"  (1.676 m) 178 lb 6.4 oz (80.922 kg) 28.79 kg/m2         Orders Placed This Encounter     Basic Metabolic Panel (BMET) [ZRA07 Custom]    Magnesium [LAB103 Custom]    Pacemaker Device Observation [  VAS2001 Custom]    EKG 12-Lead [EKG1 Custom]       Results are available for this encounter       Ordered Medications         Disp Refills Start End    valsartan (DIOVAN) 160 MG tablet 30 tablet 6 02/25/2012 02/24/2013    Take 1 tablet (160 mg total) by mouth daily. - Oral    propafenone (RYTHMOL) 225 MG tablet 60 tablet 6 02/25/2012      Take one tablet by mouth twice daily.       Discontinued Medications         Reason for Discontinue    atenolol (TENORMIN) 50 MG tablet Error    valsartan (DIOVAN) 320 MG tablet Dose change       Level of Service     PR OFFICE OUTPATIENT VISIT 40 MINUTES [82641]         All Charges for This Encounter       Code Description Service Date Service Provider Modifiers Quantity    93000 PR ELECTROCARDIOGRAM, COMPLETE 02/25/2012 [name], MD 59 1    [phone] PR PROGRAM EVAL IMPLANTABLE IN PERSN DUAL LD PACER 02/25/2012 [name], MD    1    [phone] PR OFFICE OUTPATIENT VISIT 40 MINUTES 02/25/2012 [name], MD   1    [phone] CHG BASIC METABOLIC PANEL CALCIUM TOTAL 02/25/2012 [name], MD   1    [phone] CHG ASSAY OF MAGNESIUM 02/25/2012 [name], MD   1    [phone] PR COLLECTION VENOUS BLOOD,VENIPUNCTURE 02/25/2012 [name], MD   1    [id number] PR RX CERTIFIED EHR 01/10/6380 [name], MD   1    [phone] PR CURRENT TOBACCO NON-USER 02/25/2012 [name], MD   1      Patient Instructions     Your physician has recommended you make the following change in your medication:   1) Decrease diovan to 160 mg once daily. 2) Start Rhythmol 225 mg twice daily.   Your physician recommends that you have lab work today: bmp/magnesium.   Your physician has recommended that you have a Cardioversion (DCCV). Electrical Cardioversion uses a jolt of electricity to your heart either through paddles or wired patches attached to your chest. This is a controlled, usually prescheduled, procedure. Defibrillation is done under light anesthesia in the hospital, and you usually go home the day of the procedure. This is done to get your heart back into a normal rhythm. You are not awake for the procedure. Please see the instruction sheet given to you today.             Patient Instructions History Recorded          Previous Visit         Provider Department Encounter #    02/24/2012  2:38 PM Virl Axe, MD [place] [id number]

## 2012-03-13 NOTE — Preoperative (Signed)
Beta Blockers   Reason not to administer Beta Blockers:Not Applicable 

## 2012-03-13 NOTE — Interval H&P Note (Signed)
History and Physical Interval Note:  03/13/2012 1:29 PM  [name] [name]  has presented today for surgery, with the diagnosis of atrial fib  The various methods of treatment have been discussed with the patient and family. After consideration of risks, benefits and other options for treatment, the patient has consented to  Procedure(s) (LRB): CARDIOVERSION (N/A) as a surgical intervention .  The patient's history has been reviewed, patient examined, no change in status, stable for surgery.  I have reviewed the patient's chart and labs.  Questions were answered to the patient's satisfaction.     Jenkins Rouge

## 2012-03-13 NOTE — CV Procedure (Signed)
Card[facility] INR 4.0 Rhythm Atrial fibrillation rate 92 DCC x1 120 J biphasic Converted to NSR and AV pacing rate 72 Received 42m of Propofol from anesthesia  No immediate neurologic sequelae  F/U Dr KCristino Martes

## 2012-03-13 NOTE — Progress Notes (Signed)
1336 Direct current cardioversion 150 joules. Converted to sinus brady with pvc's.

## 2012-03-13 NOTE — Anesthesia Preprocedure Evaluation (Addendum)
Anesthesia Evaluation  Patient identified by MRN, date of birth, ID band Patient awake    Reviewed: Allergy & Precautions, H&P , NPO status , Patient's Chart, lab work & pertinent test results, reviewed documented beta blocker date and time   Airway Mallampati: II TM Distance: >3 FB Neck ROM: Full    Dental  (+) Teeth Intact and Dental Advisory Given   Pulmonary shortness of breath,  breath sounds clear to auscultation        Cardiovascular hypertension, Pt. on medications and Pt. on home beta blockers + CAD + dysrhythmias Atrial Fibrillation + pacemaker Rhythm:Irregular Rate:Normal     Neuro/Psych    GI/Hepatic (+) Hepatitis -, Unspecified  Endo/Other    Renal/GU      Musculoskeletal   Abdominal   Peds  Hematology   Anesthesia Other Findings   Reproductive/Obstetrics                           Anesthesia Physical Anesthesia Plan  ASA: III  Anesthesia Plan: General   Post-op Pain Management:    Induction: Intravenous  Airway Management Planned: Mask  Additional Equipment:   Intra-op Plan:   Post-operative Plan:   Informed Consent: I have reviewed the patients History and Physical, chart, labs and discussed the procedure including the risks, benefits and alternatives for the proposed anesthesia with the patient or authorized representative who has indicated his/her understanding and acceptance.   Dental Advisory Given  Plan Discussed with: CRNA and Surgeon  Anesthesia Plan Comments:        Anesthesia Quick Evaluation

## 2012-03-17 ENCOUNTER — Encounter (HOSPITAL_COMMUNITY): Payer: Self-pay | Admitting: Cardiovascular Disease

## 2012-03-18 ENCOUNTER — Telehealth: Payer: Self-pay | Admitting: Internal Medicine

## 2012-03-18 NOTE — Telephone Encounter (Signed)
I spoke with the patient. She states that her BP after her DCCV has been ranging 140/70 range. She noticed today that her face was red around 1:00 pm - her BP at that time was 175/106. At 2 pm - 149/86, at 4 pm- 139/76. She has a follow up appointment with the PA tomorrow at 9:00 am. I have advised that since her readings are coming down, that I do not think we need to to anything urgently with her medications. I have advised her to check this again around bed time. I have advised if her readings are going back up, she can take an extra hctz 12.5 mg. Otherwise, we will see her in the morning and adjust her meds then. She verbalizes understanding.

## 2012-03-18 NOTE — Telephone Encounter (Signed)
Pt had cardioversion on Thursday the 29th and her b/p has been a little elevated and today it was 172/107 and she would like to know what to do because she is not in a-fib

## 2012-03-19 ENCOUNTER — Encounter: Payer: Self-pay | Admitting: Physician Assistant

## 2012-03-19 ENCOUNTER — Ambulatory Visit (INDEPENDENT_AMBULATORY_CARE_PROVIDER_SITE_OTHER): Payer: Medicare Other | Admitting: Physician Assistant

## 2012-03-19 ENCOUNTER — Ambulatory Visit (INDEPENDENT_AMBULATORY_CARE_PROVIDER_SITE_OTHER): Payer: Medicare Other | Admitting: Pharmacist

## 2012-03-19 VITALS — BP 160/82 | HR 62 | Ht 66.5 in | Wt 179.0 lb

## 2012-03-19 DIAGNOSIS — I4891 Unspecified atrial fibrillation: Secondary | ICD-10-CM

## 2012-03-19 DIAGNOSIS — I5032 Chronic diastolic (congestive) heart failure: Secondary | ICD-10-CM

## 2012-03-19 DIAGNOSIS — I10 Essential (primary) hypertension: Secondary | ICD-10-CM

## 2012-03-19 DIAGNOSIS — Z7901 Long term (current) use of anticoagulants: Secondary | ICD-10-CM

## 2012-03-19 LAB — POCT INR: INR: 2.6

## 2012-03-19 MED ORDER — VALSARTAN 320 MG PO TABS: 320.0000 mg | ORAL_TABLET | Freq: Every day | ORAL | Status: DC

## 2012-03-19 NOTE — Progress Notes (Signed)
HPI:  This is a very  pleasant 78 year old white female patient of Dr. Jolyn Nap who has a history of recurrent atrial fibrillation who underwent 2 cardioversions last month on 8/19 and 8/29. Her Rythmol was increased to 225 mg q.8 hours. Her Diovan was decreased. She felt great the morning after the cardioversion but since then she says her blood pressures been elevated as high as 172/107. She also has mild ankle edema. She has a history of diastolic heart failure and is watching her sodium closely. She denies palpitations, chest pain, dyspnea, dizziness, or presyncope.   Allergies:  -- Codeine    --  REACTION: feel funny  -- Penicillins    --  REACTION: anaphylaxis  -- Statins    --  REACTION: elevated LFT's  -- Tylenol (Acetaminophen) -- Other (See Comments)   --  If taken with Vytorin at risk for liver damage  Current Outpatient Prescriptions on File Prior to Visit: Calcium Carbonate-Vitamin D (CALCIUM + D) 600-200 MG-UNIT TABS, Take by mouth. 1 BID , Disp: , Rfl:  Cholecalciferol (VITAMIN D3) 1000 UNITS tablet, Take 1,000 Units by mouth daily.  , Disp: , Rfl:  furosemide (LASIX) 20 MG tablet, Take 1 tablet (20 mg total) by mouth daily., Disp: 30 tablet, Rfl: 6 hydrochlorothiazide (MICROZIDE) 12.5 MG capsule, Take 1 capsule (12.5 mg total) by mouth daily., Disp: 30 capsule, Rfl: 6 multivitamin (THERAGRAN) per tablet, Take 1 tablet by mouth daily.  , Disp: , Rfl:  nadolol (CORGARD) 20 MG tablet, Take 1 tablet (20 mg total) by mouth daily., Disp: 30 tablet, Rfl: 5 potassium chloride SA (K-DUR,KLOR-CON) 20 MEQ tablet, Take 1 tablet (20 mEq total) by mouth daily., Disp: 30 tablet, Rfl: 6 propafenone (RYTHMOL) 225 MG tablet, Take 1 tablet (225 mg total) by mouth every 8 (eight) hours., Disp: , Rfl:  warfarin (COUMADIN) 2.5 MG tablet, Take as directed by Anticoagulation clinic.., Disp: 60 tablet, Rfl: 3    Past Medical History:   PAF (paroxysmal atrial fibrillation)                             Comment:Previously on flecanide   sinus node dysfunction//post termination pauses              HTN (hypertension)                                           CAD (coronary artery disease)                                NASH (nonalcoholic steatohepatitis)                          PMR (polymyalgia rheumatica)                                   Comment:no steriods for 5 years   HLD (hyperlipidemia)                                         Shortness of breath  Neuromuscular disorder                                       Hepatitis                                                      Comment:hx of medication induced hepatitis   Ascending aorta enlargement                                    Comment:4.8 cm per echo 08/07/11; 5.0 by CT in Jan 2013   Pacemaker-Medtronic                             11/12/2011      Comment:Implanted 2013   Past Surgical History:   UMBILICAL HERNIA REPAIR                         1950's       INCISION AND DRAINAGE BREAST ABSCESS            1973         CARDIAC CATHETERIZATION                                      HERNIA REPAIR                                                PACEMAKER INSERTION                             Jan 2013     CARDIOVERSION                                   03/02/2012      Comment:Procedure: CARDIOVERSION;  Surgeon: Lelon Perla, MD;  Location: [facility] ENDOSCOPY;                Service: Cardiovascular;  Laterality: N/A;   CARDIOVERSION                                   03/13/2012      Comment:Procedure: CARDIOVERSION;  Surgeon: Josue Hector, MD;  Location: [facility] ENDOSCOPY;  Service:               Cardiovascular;  Laterality: N/A;  Review of patient's family history indicates:   Coronary artery disease                                 Alzheimer's  disease                                     Social History   Marital Status: Married             Spouse Name:                       Years of Education:                 Number of children:             Occupational History   None on file  Social History Main Topics   Smoking Status: Former Smoker                   Packs/Day:       Years:           Types: Cigarettes     Quit date: 07/16/1990   Smokeless Status: Never Used                       Alcohol Use: No             Drug Use: No             Sexual Activity: No                 Other Topics            Concern   None on file  Social History Narrative   None on file    ROS:see history of present illness otherwise negative   PHYSICAL EXAM: Well-nournished, in no acute distress. Neck: No JVD, HJR, Bruit, or thyroid enlargement  Lungs: No tachypnea, clear without wheezing, rales, or rhonchi  Cardiovascular: RRR, PMI not displaced, positive S4 and 2/6 systolic murmur at the left sternal border, no bruit, thrill, or heave.  Abdomen: BS normal. Soft without organomegaly, masses, lesions or tenderness.  Extremities:trace of ankle edema bilaterally, lower extremities without cyanosis, clubbing . Good distal pulses bilateral  SKin: Warm, no lesions or rashes   Musculoskeletal: No deformities  Neuro: no focal signs  BP 160/82  Pulse 62  Ht 5' 6.5" (1.689 m)  Wt 179 lb (81.194 kg)  BMI 28.46 kg/m2   VQQ:UIVHOY sinus rhythm

## 2012-03-19 NOTE — Assessment & Plan Note (Signed)
Patient developed mild ankle edema since her blood pressures been elevated. I asked her to take an extra Lasix tomorrow morning if it is not improved.

## 2012-03-19 NOTE — Assessment & Plan Note (Signed)
Patient's blood pressures been up the past few days as high as 172/107. Her Diovan was decreased. I will increase her Diovan to 320 mg daily. She will continue to take her blood pressure at home. She states she is on a low-sodium diet which she will continue.

## 2012-03-19 NOTE — Assessment & Plan Note (Signed)
Patient underwent cardioversion on 819/and a 29/13 she is now on Rythmol 225 mg q.8 hours. She is maintaining normal sinus rhythm.

## 2012-03-19 NOTE — Patient Instructions (Addendum)
Please increase your Diovan to 320 mg a day You may take an extra 20 mg of Furosemide daily if needed for swelling Continue all other medications as listed.  Follow up with Dr Caryl Comes in 2 weeks.  2 Gram Low Sodium Diet A 2 gram sodium diet restricts the amount of sodium in the diet to no more than 2 g or 2000 mg daily. Limiting the amount of sodium is often used to help lower blood pressure. It is important if you have heart, liver, or kidney problems. Many foods contain sodium for flavor and sometimes as a preservative. When the amount of sodium in a diet needs to be low, it is important to know what to look for when choosing foods and drinks. The following includes some information and guidelines to help make it easier for you to adapt to a low sodium diet. QUICK TIPS  Do not add salt to food.   Avoid convenience items and fast food.   Choose unsalted snack foods.   Buy lower sodium products, often labeled as "lower sodium" or "no salt added."   Check food labels to learn how much sodium is in 1 serving.   When eating at a restaurant, ask that your food be prepared with less salt or none, if possible.  READING FOOD LABELS FOR SODIUM INFORMATION The nutrition facts label is a good place to find how much sodium is in foods. Look for products with no more than 500 to 600 mg of sodium per meal and no more than 150 mg per serving. Remember that 2 g = 2000 mg. The food label may also list foods as:  Sodium-free: Less than 5 mg in a serving.   Very low sodium: 35 mg or less in a serving.   Low-sodium: 140 mg or less in a serving.   Light in sodium: 50% less sodium in a serving. For example, if a food that usually has 300 mg of sodium is changed to become light in sodium, it will have 150 mg of sodium.   Reduced sodium: 25% less sodium in a serving. For example, if a food that usually has 400 mg of sodium is changed to reduced sodium, it will have 300 mg of sodium.  CHOOSING  FOODS Grains  Avoid: Salted crackers and snack items. Some cereals, including instant hot cereals. Bread stuffing and biscuit mixes. Seasoned rice or pasta mixes.   Choose: Unsalted snack items. Low-sodium cereals, oats, puffed wheat and rice, shredded wheat. English muffins and bread. Pasta.  Meats  Avoid: Salted, canned, smoked, spiced, pickled meats, including fish and poultry. Bacon, ham, sausage, cold cuts, hot dogs, anchovies.   Choose: Low-sodium canned tuna and salmon. Fresh or frozen meat, poultry, and fish.  Dairy  Avoid: Processed cheese and spreads. Cottage cheese. Buttermilk and condensed milk. Regular cheese.   Choose: Milk. Low-sodium cottage cheese. Yogurt. Sour cream. Low-sodium cheese.  Fruits and Vegetables  Avoid: Regular canned vegetables. Regular canned tomato sauce and paste. Frozen vegetables in sauces. Olives. Angie Fava. Relishes. Sauerkraut.   Choose: Low-sodium canned vegetables. Low-sodium tomato sauce and paste. Frozen or fresh vegetables. Fresh and frozen fruit.  Condiments  Avoid: Canned and packaged gravies. Worcestershire sauce. Tartar sauce. Barbecue sauce. Soy sauce. Steak sauce. Ketchup. Onion, garlic, and table salt. Meat flavorings and tenderizers.   Choose: Fresh and dried herbs and spices. Low-sodium varieties of mustard and ketchup. Lemon juice. Tabasco sauce. Horseradish.  SAMPLE 2 GRAM SODIUM MEAL PLAN Breakfast / Sodium (mg)  1  cup low-fat milk / 768 mg   2 slices whole-wheat toast / 270 mg   1 tbs heart-healthy margarine / 153 mg   1 hard-boiled egg / 139 mg   1 small orange / 0 mg  Lunch / Sodium (mg)  1 cup raw carrots / 76 mg    cup hummus / 298 mg   1 cup low-fat milk / 143 mg    cup red grapes / 2 mg   1 whole-wheat pita bread / 356 mg  Dinner / Sodium (mg)  1 cup whole-wheat pasta / 2 mg   1 cup low-sodium tomato sauce / 73 mg   3 oz lean ground beef / 57 mg   1 small side salad (1 cup raw spinach leaves,   cup cucumber,  cup yellow bell pepper) with 1 tsp olive oil and 1 tsp red wine vinegar / 25 mg  Snack / Sodium (mg)  1 container low-fat vanilla yogurt / 107 mg   3 graham cracker squares / 127 mg  Nutrient Analysis  Calories: 2033   Protein: 77 g   Carbohydrate: 282 g   Fat: 72 g   Sodium: 1971 mg  Document Released: 07/01/2005 Document Revised: 06/20/2011 Document Reviewed: 10/02/2009 [facility] Patient Information 2012 [place], [place].

## 2012-04-06 ENCOUNTER — Ambulatory Visit (INDEPENDENT_AMBULATORY_CARE_PROVIDER_SITE_OTHER): Payer: Medicare Other | Admitting: *Deleted

## 2012-04-06 VITALS — BP 120/80 | HR 88 | Resp 18

## 2012-04-06 DIAGNOSIS — Z7901 Long term (current) use of anticoagulants: Secondary | ICD-10-CM

## 2012-04-06 DIAGNOSIS — I4891 Unspecified atrial fibrillation: Secondary | ICD-10-CM

## 2012-04-06 LAB — POCT INR: INR: 3.8

## 2012-04-06 NOTE — Progress Notes (Signed)
The patient was in the office today for an INR check (3.8). She informed me that she had been in a-fib for the last 3 days. EKG does confirm a-fib with a HR of 88. She feels ok, but does notice that her HR will increase a little with activity. She has not tolerated flecainide in the past. She is currently on rhythmol 225 mg every 8 hours. She also take nadolol 20 mg daily. I reviewed with Dr. Rayann Heman. He made no recommendations for changes today, but to make Dr. Caryl Comes aware. I have advised the patient that I will forward a message to Dr. Caryl Comes that she is back in a-fib and see what his recommendations will be. She has been advised to call back should she become symptomatic with her a-fib. She also questioned if she could take an additional lasix as needed for lower extremity edema that she tends to develop with her a-fib. Last renal function from 8/19 was normal, but K+ low at 3.3. I have advised if her weight is going up and she has edema, she may take an extra lasix as needed with an additional potassium. She verbalizes understanding.

## 2012-04-07 ENCOUNTER — Other Ambulatory Visit: Payer: Self-pay | Admitting: Internal Medicine

## 2012-04-07 DIAGNOSIS — I4891 Unspecified atrial fibrillation: Secondary | ICD-10-CM

## 2012-04-07 MED ORDER — PROPAFENONE HCL 225 MG PO TABS: 225.0000 mg | ORAL_TABLET | Freq: Three times a day (TID) | ORAL | Status: DC

## 2012-04-08 ENCOUNTER — Other Ambulatory Visit: Payer: Self-pay | Admitting: Cardiology

## 2012-04-09 ENCOUNTER — Telehealth: Payer: Self-pay | Admitting: Internal Medicine

## 2012-04-09 NOTE — Telephone Encounter (Signed)
Patient called stated she has been in Atrial Fib since last Saturday 04/04/12.Stated her B/P today 142/90.Stated she talked to [facility] 04/06/12 and she was going to send message to [place].Patient was told Dr.Klein out of office.Stated she has appointment with Richardson Dopp PA 04/14/12.Advised to keep appointment and call back if needed.

## 2012-04-09 NOTE — Telephone Encounter (Signed)
Pt calling re BP running high, been in a-fib all week , pls call  [phone]

## 2012-04-14 ENCOUNTER — Telehealth: Payer: Self-pay | Admitting: Internal Medicine

## 2012-04-14 ENCOUNTER — Encounter: Payer: Self-pay | Admitting: Physician Assistant

## 2012-04-14 ENCOUNTER — Ambulatory Visit (INDEPENDENT_AMBULATORY_CARE_PROVIDER_SITE_OTHER): Payer: Medicare Other | Admitting: Physician Assistant

## 2012-04-14 ENCOUNTER — Encounter: Payer: Self-pay | Admitting: *Deleted

## 2012-04-14 ENCOUNTER — Ambulatory Visit (INDEPENDENT_AMBULATORY_CARE_PROVIDER_SITE_OTHER): Payer: Medicare Other

## 2012-04-14 VITALS — BP 144/102 | HR 105 | Ht 66.5 in | Wt 177.1 lb

## 2012-04-14 DIAGNOSIS — I4891 Unspecified atrial fibrillation: Secondary | ICD-10-CM

## 2012-04-14 DIAGNOSIS — I7789 Other specified disorders of arteries and arterioles: Secondary | ICD-10-CM

## 2012-04-14 DIAGNOSIS — Z7901 Long term (current) use of anticoagulants: Secondary | ICD-10-CM

## 2012-04-14 DIAGNOSIS — I10 Essential (primary) hypertension: Secondary | ICD-10-CM

## 2012-04-14 LAB — CBC WITH DIFFERENTIAL/PLATELET
Basophils Absolute: 0 10*3/uL (ref 0.0–0.1)
Basophils Relative: 0.4 % (ref 0.0–3.0)
Eosinophils Absolute: 0.2 10*3/uL (ref 0.0–0.7)
Lymphocytes Relative: 17.8 % (ref 12.0–46.0)
MCHC: 32.2 g/dL (ref 30.0–36.0)
MCV: 95.7 fl (ref 78.0–100.0)
Monocytes Absolute: 0.7 10*3/uL (ref 0.1–1.0)
Neutrophils Relative %: 71.4 % (ref 43.0–77.0)
Platelets: 278 10*3/uL (ref 150.0–400.0)
RDW: 14.4 % (ref 11.5–14.6)

## 2012-04-14 LAB — HEPATIC FUNCTION PANEL
ALT: 53 U/L — ABNORMAL HIGH (ref 0–35)
AST: 42 U/L — ABNORMAL HIGH (ref 0–37)
Albumin: 3.4 g/dL — ABNORMAL LOW (ref 3.5–5.2)
Total Protein: 6.8 g/dL (ref 6.0–8.3)

## 2012-04-14 LAB — BASIC METABOLIC PANEL
BUN: 25 mg/dL — ABNORMAL HIGH (ref 6–23)
CO2: 27 mEq/L (ref 19–32)
Calcium: 9.3 mg/dL (ref 8.4–10.5)
Chloride: 105 mEq/L (ref 96–112)
Creatinine, Ser: 1 mg/dL (ref 0.4–1.2)
Glucose, Bld: 95 mg/dL (ref 70–99)

## 2012-04-14 MED ORDER — NADOLOL 80 MG PO TABS: 80.0000 mg | ORAL_TABLET | Freq: Every day | ORAL | Status: DC

## 2012-04-14 MED ORDER — AMIODARONE HCL 200 MG PO TABS: 400.0000 mg | ORAL_TABLET | Freq: Two times a day (BID) | ORAL | Status: DC

## 2012-04-14 MED ORDER — AMIODARONE HCL 200 MG PO TABS: 400.0000 mg | ORAL_TABLET | ORAL | Status: DC

## 2012-04-14 NOTE — Patient Instructions (Addendum)
Your physician has recommended you make the following change in your medication:  1.)  INCREASE NADOLOL ONE TABLET (80 MG) DAILY 2.) START AMIODARONE ONE TABLET ( 400 MG) TWICE A DAY FOR TWO WEEKS THAN DECREASE TO ONE HALF TABLET ( 200 MG) TWICE A DAY 3.) 24 HOURS AFTER CARDIOVERSION DISCONTINUE RYTHMOL  Your physician recommends that you HAVE lab work TODAY: tsh/lft/bmet/cbc/w diff  Your physician has recommended that you have a Cardioversion (DCCV). DX: AFIB  Electrical Cardioversion uses a jolt of electricity to your heart either through paddles or wired patches attached to your chest. This is a controlled, usually prescheduled, procedure. Defibrillation is done under light anesthesia in the hospital, and you usually go home the day of the procedure. This is done to get your heart back into a normal rhythm. You are not awake for the procedure. Please see the instruction sheet given to you today.  Your physician recommends that you schedule a follow-up appointment with coumadin on the October 9th 8:45am You have been referred to DR. ALLRED IN 4-6 WEEKS FOR AFIB ABLATION.  IF YOU HAVE ANY QUESTIONS PLEASE FEEL FREE TO CONTACT OUR OFFICE AT [phone]

## 2012-04-14 NOTE — H&P (Signed)
History and Physical  Date:  04/14/2012   Name:  [name] [name]   DOB:  1930-12-15   MRN:  [id number]  PCP:  [name], MD  Primary Cardiologist/Primary Electrophysiologist:  Dr. Virl Axe    History of Present Illness: [name] [name] is a 78 y.o. female who returns for follow up.  She has a hx of AFib, previously on Flecainide, post termination pauses, s/p pacer, ascending aortic aneurysm, non-obstructive CAD at cath in 2008, HTN, NASH, PMR, HL.  Noted to be back in AFib 02/28/12 and Dr. Virl Axe placed her on Propafenone.  DCCV done 8/19.  She returned to AFib and dose was increased.  Repeat DCCV done 8/30.  Saw Gerrianne Scale, PA-C 03/19/12.  BP was elevated and she had some ankle edema.  She was in NSR then.  Diovan was increased.  She was asked to follow up today.  States she went back in AFib 11 days ago.  She feels awful in this.  Feels her heart beating hard and fast, especially when lying on her back.  Feels somewhat more dyspneic.  No chest pain. No syncope.  Does feel lightheaded.  No significant edema.  She is concerned that her BP is high.  It seems to be more elevated since she started taking Rythmol TID.     Wt Readings from Last 3 Encounters:  04/14/12 177 lb 1.9 oz (80.341 kg)  03/19/12 179 lb (81.194 kg)  03/02/12 178 lb (80.74 kg)     Past Medical History  Diagnosis Date  . PAF (paroxysmal atrial fibrillation)     Previously on flecanide  . sinus node dysfunction//post termination pauses   . HTN (hypertension)   . CAD (coronary artery disease)     LHC 5/08:  pOM 20%, pRCA 20-30%, EF 60%  . NASH (nonalcoholic steatohepatitis)   . PMR (polymyalgia rheumatica)     no steriods for 5 years  . HLD (hyperlipidemia)   . Shortness of breath   . Neuromuscular disorder   . Hepatitis     hx of medication induced hepatitis  . Ascending aorta enlargement     4.8 cm per echo 08/07/11; 5.0 by CT in Jan 2013  . Pacemaker-Medtronic 11/12/2011    Implanted 2013     Current  Outpatient Prescriptions  Medication Sig Dispense Refill  . Calcium Carbonate-Vitamin D (CALCIUM + D) 600-200 MG-UNIT TABS Take by mouth. 1 BID       . Cholecalciferol (VITAMIN D3) 1000 UNITS tablet Take 1,000 Units by mouth daily.        . furosemide (LASIX) 20 MG tablet Take 1 tablet (20 mg total) by mouth daily.  30 tablet  6  . hydrochlorothiazide (MICROZIDE) 12.5 MG capsule Take 1 capsule (12.5 mg total) by mouth daily.  30 capsule  6  . multivitamin (THERAGRAN) per tablet Take 1 tablet by mouth daily.        . nadolol (CORGARD) 20 MG tablet Take 1 tablet (20 mg total) by mouth daily.  30 tablet  5  . potassium chloride SA (K-DUR,KLOR-CON) 20 MEQ tablet Take 1 tablet (20 mEq total) by mouth daily.  30 tablet  6  . propafenone (RYTHMOL) 225 MG tablet Take 1 tablet (225 mg total) by mouth every 8 (eight) hours.  90 tablet  6  . valsartan (DIOVAN) 320 MG tablet Take 1 tablet (320 mg total) by mouth daily.  30 tablet  6  . warfarin (COUMADIN) 2.5 MG tablet Take as directed  by Anticoagulation clinic..  60 tablet  3    Allergies: Allergies  Allergen Reactions  . Codeine     REACTION: feel funny  . Penicillins     REACTION: anaphylaxis  . Statins     REACTION: elevated LFT's  . Tylenol (Acetaminophen) Other (See Comments)    If taken with Vytorin at risk for liver damage    History  Substance Use Topics  . Smoking status: Former Smoker    Types: Cigarettes    Quit date: 07/16/1990  . Smokeless tobacco: Never Used  . Alcohol Use: No    Family History  Problem Relation Age of Onset  . Coronary artery disease    . Alzheimer's disease      ROS:  Please see the history of present illness.   She has had some sinus congestion.  All other systems reviewed and negative.   PHYSICAL EXAM: VS:  BP 144/102  Pulse 105  Ht 5' 6.5" (1.689 m)  Wt 177 lb 1.9 oz (80.341 kg)  BMI 28.16 kg/m2 Well nourished, well developed, in no acute distress HEENT: normal Neck: no JVD Cardiac:  normal  S1, S2; rapid irregular rhythm, no obvious murmur Lungs:  clear to auscultation bilaterally, no wheezing, rhonchi or rales Abd: soft, nontender, no hepatomegaly Ext: no edema Skin: warm and dry Neuro:  CNs 2-12 intact, no focal abnormalities noted  EKG:  AFib, HR 105, normal axis, NSSTTW changes      ASSESSMENT AND PLAN:  1. Atrial Fibrillation:  She has now failed 2 AADs.  Reviewed with Dr. Virl Axe.  He also saw the patient.  Her QTc is likely too long to try Tikosyn.  Will start Amiodarone 400 mg bid.  Stay on Rythmol.  INR today therapeutic.  Schedule DCCV this week.  She will stop Rythmol 24 hrs after DCCV.  Decrease amiodarone to 200 mg bid after 2 weeks.  Check LFTs and TSH.  Refer to Dr. Thompson Grayer to discuss possible AFib ablation.   Lab Results  Component Value Date   INR 3.0 04/14/2012   INR 3.8 04/06/2012   INR 2.6 03/19/2012         2. Hypertension:  BPs elevated recently.  Will increase Nadolol to 80 mg QD.  Need long term BP control due to ascending aorta aneurysm.  3. Ascending Aorta Aneurysm:  Followed by Dr. Festus Barren.  4. NASH:  Will need to keep close eye on LFTs with amiodarone.   Signed, Richardson Dopp, PA-C  9:19 AM 04/14/2012

## 2012-04-14 NOTE — Telephone Encounter (Signed)
plz return call to patient [phone] regarding prep for hosp procedure

## 2012-04-14 NOTE — Telephone Encounter (Signed)
Okay to take fluid pill tomorrow  Patient aware

## 2012-04-14 NOTE — Progress Notes (Signed)
[place], [place]  [address] Phone: [phone] Fax:  [phone]  Date:  04/14/2012   Name:  [name] [name]   DOB:  Jul 03, 1931   MRN:  [id number]  PCP:  [name], MD  Primary Cardiologist/Primary Electrophysiologist:  Dr. Virl Axe    History of Present Illness: [name] [name] is a 78 y.o. female who returns for follow up.  She has a hx of AFib, previously on Flecainide, post termination pauses, s/p pacer, ascending aortic aneurysm, non-obstructive CAD at cath in 2008, HTN, NASH, PMR, HL.  Noted to be back in AFib 02/28/12 and Dr. Virl Axe placed her on Propafenone.  DCCV done 8/19.  She returned to AFib and dose was increased.  Repeat DCCV done 8/30.  Saw Gerrianne Scale, PA-C 03/19/12.  BP was elevated and she had some ankle edema.  She was in NSR then.  Diovan was increased.  She was asked to follow up today.  States she went back in AFib 11 days ago.  She feels awful in this.  Feels her heart beating hard and fast, especially when lying on her back.  Feels somewhat more dyspneic.  No chest pain. No syncope.  Does feel lightheaded.  No significant edema.  She is concerned that her BP is high.  It seems to be more elevated since she started taking Rythmol TID.     Wt Readings from Last 3 Encounters:  04/14/12 177 lb 1.9 oz (80.341 kg)  03/19/12 179 lb (81.194 kg)  03/02/12 178 lb (80.74 kg)     Past Medical History  Diagnosis Date  . PAF (paroxysmal atrial fibrillation)     Previously on flecanide  . sinus node dysfunction//post termination pauses   . HTN (hypertension)   . CAD (coronary artery disease)     LHC 5/08:  pOM 20%, pRCA 20-30%, EF 60%  . NASH (nonalcoholic steatohepatitis)   . PMR (polymyalgia rheumatica)     no steriods for 5 years  . HLD (hyperlipidemia)   . Shortness of breath   . Neuromuscular disorder   . Hepatitis     hx of medication induced hepatitis  . Ascending aorta enlargement     4.8 cm per echo 08/07/11; 5.0  by CT in Jan 2013  . Pacemaker-Medtronic 11/12/2011    Implanted 2013     Current Outpatient Prescriptions  Medication Sig Dispense Refill  . Calcium Carbonate-Vitamin D (CALCIUM + D) 600-200 MG-UNIT TABS Take by mouth. 1 BID       . Cholecalciferol (VITAMIN D3) 1000 UNITS tablet Take 1,000 Units by mouth daily.        . furosemide (LASIX) 20 MG tablet Take 1 tablet (20 mg total) by mouth daily.  30 tablet  6  . hydrochlorothiazide (MICROZIDE) 12.5 MG capsule Take 1 capsule (12.5 mg total) by mouth daily.  30 capsule  6  . multivitamin (THERAGRAN) per tablet Take 1 tablet by mouth daily.        . nadolol (CORGARD) 20 MG tablet Take 1 tablet (20 mg total) by mouth daily.  30 tablet  5  . potassium chloride SA (K-DUR,KLOR-CON) 20 MEQ tablet Take 1 tablet (20 mEq total) by mouth daily.  30 tablet  6  . propafenone (RYTHMOL) 225 MG tablet Take 1 tablet (225 mg total) by mouth every 8 (eight) hours.  90 tablet  6  . valsartan (DIOVAN) 320 MG tablet Take 1 tablet (320 mg total) by mouth daily.  30 tablet  6  . warfarin (COUMADIN) 2.5 MG tablet Take as directed by Anticoagulation clinic..  60 tablet  3    Allergies: Allergies  Allergen Reactions  . Codeine     REACTION: feel funny  . Penicillins     REACTION: anaphylaxis  . Statins     REACTION: elevated LFT's  . Tylenol (Acetaminophen) Other (See Comments)    If taken with Vytorin at risk for liver damage    History  Substance Use Topics  . Smoking status: Former Smoker    Types: Cigarettes    Quit date: 07/16/1990  . Smokeless tobacco: Never Used  . Alcohol Use: No    Family History  Problem Relation Age of Onset  . Coronary artery disease    . Alzheimer's disease      ROS:  Please see the history of present illness.   She has had some sinus congestion.  All other systems reviewed and negative.   PHYSICAL EXAM: VS:  BP 144/102  Pulse 105  Ht 5' 6.5" (1.689 m)  Wt 177 lb 1.9 oz (80.341 kg)  BMI 28.16 kg/m2 Well  nourished, well developed, in no acute distress HEENT: normal Neck: no JVD Cardiac:  normal S1, S2; rapid irregular rhythm, no obvious murmur Lungs:  clear to auscultation bilaterally, no wheezing, rhonchi or rales Abd: soft, nontender, no hepatomegaly Ext: no edema Skin: warm and dry Neuro:  CNs 2-12 intact, no focal abnormalities noted  EKG:  AFib, HR 105, normal axis, NSSTTW changes      ASSESSMENT AND PLAN:  1. Atrial Fibrillation:  She has now failed 2 AADs.  Reviewed with Dr. Virl Axe.  He also saw the patient.  Her QTc is likely too long to try Tikosyn.  Will start Amiodarone 400 mg bid.  Stay on Rythmol.  INR today therapeutic.  Schedule DCCV this week.  She will stop Rythmol 24 hrs after DCCV.  Decrease amiodarone to 200 mg bid after 2 weeks.  Check LFTs and TSH.  Refer to Dr. Thompson Grayer to discuss possible AFib ablation.   Lab Results  Component Value Date   INR 3.0 04/14/2012   INR 3.8 04/06/2012   INR 2.6 03/19/2012         2. Hypertension:  BPs elevated recently.  Will increase Nadolol to 80 mg QD.  Need long term BP control due to ascending aorta aneurysm.  3. Ascending Aorta Aneurysm:  Followed by Dr. Festus Barren.  4. NASH:  Will need to keep close eye on LFTs with amiodarone.   Signed, Richardson Dopp, PA-C  9:19 AM 04/14/2012

## 2012-04-15 ENCOUNTER — Ambulatory Visit (HOSPITAL_COMMUNITY)
Admission: RE | Admit: 2012-04-15 | Discharge: 2012-04-15 | Disposition: A | Discharge status: Home / Self Care | Payer: Medicare Other | Source: Ambulatory Visit | Attending: Cardiovascular Disease | Admitting: Cardiovascular Disease

## 2012-04-15 ENCOUNTER — Ambulatory Visit (HOSPITAL_COMMUNITY): Payer: Medicare Other | Admitting: Anesthesiology

## 2012-04-15 ENCOUNTER — Encounter (HOSPITAL_COMMUNITY): Payer: Self-pay | Admitting: Anesthesiology

## 2012-04-15 ENCOUNTER — Encounter (HOSPITAL_COMMUNITY)
Admission: RE | Disposition: A | Discharge status: Home / Self Care | Payer: Self-pay | Source: Ambulatory Visit | Attending: Cardiovascular Disease

## 2012-04-15 DIAGNOSIS — I10 Essential (primary) hypertension: Secondary | ICD-10-CM | POA: Insufficient documentation

## 2012-04-15 DIAGNOSIS — I4891 Unspecified atrial fibrillation: Secondary | ICD-10-CM | POA: Insufficient documentation

## 2012-04-15 DIAGNOSIS — Z950 Presence of cardiac pacemaker: Secondary | ICD-10-CM | POA: Insufficient documentation

## 2012-04-15 DIAGNOSIS — I2510 Atherosclerotic heart disease of native coronary artery without angina pectoris: Secondary | ICD-10-CM | POA: Insufficient documentation

## 2012-04-15 HISTORY — PX: CARDIOVERSION: SHX1299

## 2012-04-15 SURGERY — CARDIOVERSION
Anesthesia: Monitor Anesthesia Care | Wound class: Clean

## 2012-04-15 MED ORDER — PROPOFOL 10 MG/ML IV BOLUS
INTRAVENOUS | Status: DC | PRN
  Administered 2012-04-15: 60 mg via INTRAVENOUS

## 2012-04-15 MED ORDER — SODIUM CHLORIDE 0.9 % IV SOLN
INTRAVENOUS | Status: DC
  Administered 2012-04-15: 15:00:00 via INTRAVENOUS

## 2012-04-15 NOTE — Transfer of Care (Signed)
Immediate Anesthesia Transfer of Care Note  Patient: [name] [name]  Procedure(s) Performed: Procedure(s) (LRB) with comments: CARDIOVERSION (N/A)  Patient Location: PACU  Anesthesia Type: MAC  Level of Consciousness: awake, alert  and oriented  Airway & Oxygen Therapy: Patient Spontanous Breathing and Patient connected to nasal cannula oxygen  Post-op Assessment: Report given to PACU RN, Post -op Vital signs reviewed and stable and Patient moving all extremities X 4  Post vital signs: Reviewed and stable  Complications: No apparent anesthesia complications

## 2012-04-15 NOTE — Anesthesia Preprocedure Evaluation (Addendum)
Anesthesia Evaluation  Patient identified by MRN, date of birth, ID band Patient awake    Reviewed: Allergy & Precautions, H&P , NPO status , Patient's Chart, lab work & pertinent test results  Airway Mallampati: II TM Distance: >3 FB Neck ROM: Full    Dental  (+) Dental Advisory Given and Teeth Intact   Pulmonary  breath sounds clear to auscultation        Cardiovascular hypertension, Pt. on medications and Pt. on home beta blockers + CAD + dysrhythmias Atrial Fibrillation + pacemaker Rhythm:Irregular Rate:Tachycardia     Neuro/Psych    GI/Hepatic   Endo/Other    Renal/GU      Musculoskeletal   Abdominal   Peds  Hematology   Anesthesia Other Findings   Reproductive/Obstetrics                         Anesthesia Physical Anesthesia Plan  ASA: III  Anesthesia Plan: MAC   Post-op Pain Management:    Induction: Intravenous  Airway Management Planned: Mask  Additional Equipment:   Intra-op Plan:   Post-operative Plan:   Informed Consent: I have reviewed the patients History and Physical, chart, labs and discussed the procedure including the risks, benefits and alternatives for the proposed anesthesia with the patient or authorized representative who has indicated his/her understanding and acceptance.   Dental advisory given  Plan Discussed with: Anesthesiologist, Surgeon and CRNA  Anesthesia Plan Comments:        Anesthesia Quick Evaluation

## 2012-04-15 NOTE — CV Procedure (Signed)
Electrical Cardioversion Procedure Note [name] [name] [id number] [birth year]  Procedure: Electrical Cardioversion Indications:  Atrial Fibrillation  Procedure Details Consent: Risks of procedure as well as the alternatives and risks of each were explained to the (patient/caregiver).  Consent for procedure obtained. Time Out: Verified patient identification, verified procedure, site/side was marked, verified correct patient position, special equipment/implants available, medications/allergies/relevent history reviewed, required imaging and test results available.  Performed  Patient placed on cardiac monitor, pulse oximetry, supplemental oxygen as necessary.  Sedation given: Muscle relaxants Pacer pads placed anterior and posterior chest.  Cardioverted 1 time(s).  Cardioverted at 120J.  Evaluation Findings: Post procedure EKG shows: NSR Complications: None Patient did tolerate procedure well.   [name] [name] 04/15/2012, 2:29 PM

## 2012-04-15 NOTE — Preoperative (Signed)
Beta Blockers   Reason not to administer Beta Blockers:Not Applicable 

## 2012-04-15 NOTE — Anesthesia Postprocedure Evaluation (Signed)
  Anesthesia Post-op Note  Patient: [name] [name]  Procedure(s) Performed: Procedure(s) (LRB) with comments: CARDIOVERSION (N/A)  Patient Location:Endoscopy  Anesthesia Type: General  Level of Consciousness: awake and alert   Airway and Oxygen Therapy: Patient Spontanous Breathing and Patient connected to nasal cannula oxygen  Post-op Pain: none  Post-op Assessment: Post-op Vital signs reviewed and Patient's Cardiovascular Status Stable  Post-op Vital Signs: stable  Complications: No apparent anesthesia complications

## 2012-04-16 ENCOUNTER — Other Ambulatory Visit: Payer: Self-pay | Admitting: *Deleted

## 2012-04-16 ENCOUNTER — Telehealth: Payer: Self-pay | Admitting: *Deleted

## 2012-04-16 ENCOUNTER — Encounter (HOSPITAL_COMMUNITY): Payer: Self-pay | Admitting: Cardiology

## 2012-04-16 DIAGNOSIS — I5032 Chronic diastolic (congestive) heart failure: Secondary | ICD-10-CM

## 2012-04-16 NOTE — Telephone Encounter (Signed)
Message copied by Michae Kava on Thu Apr 16, 2012  3:48 PM ------      Message from: [place], [place] T      Created: Tue Apr 14, 2012  4:42 PM       Mildly elevated transaminases      With addition of Amiodarone, repeat LFTs in 4 weeks.            TSH ok      Hgb ok.      Creatinine ok            K+ low normal      Increase K+ from 20 mEq QD to 20 mEq BID.      Check BMET in 1-2 weeks.      Richardson Dopp, PA-C  4:42 PM 04/14/2012

## 2012-04-16 NOTE — Telephone Encounter (Signed)
Pt notified about lab results and to increase K+ 20 bid, bmet 04/29/12 and pt states she feels LFT should be sooner than 4 weeks since she has had a h/o hepatitis due to statins and is very uncomfortable about waiting 4 weeks. Told pt that I will d/w PA and let him know of her concern.

## 2012-04-17 ENCOUNTER — Telehealth: Payer: Self-pay | Admitting: *Deleted

## 2012-04-17 DIAGNOSIS — I10 Essential (primary) hypertension: Secondary | ICD-10-CM

## 2012-04-17 NOTE — Telephone Encounter (Signed)
Message copied by Michae Kava on Fri Apr 17, 2012 12:25 PM ------      Message from: [place], [place] T      Created: Fri Apr 17, 2012  8:40 AM       Ok      Repeat LFTs in 2 weeks and make sure results go to Dr. Virl Axe       Richardson Dopp, PA-C  8:40 AM 04/17/2012

## 2012-04-17 NOTE — Telephone Encounter (Signed)
lmom ok per Brynda Rim. to have LFT done in 2 weeks with bmet 10/16

## 2012-04-22 ENCOUNTER — Ambulatory Visit (INDEPENDENT_AMBULATORY_CARE_PROVIDER_SITE_OTHER): Payer: Medicare Other | Admitting: Pharmacist

## 2012-04-22 DIAGNOSIS — I4891 Unspecified atrial fibrillation: Secondary | ICD-10-CM

## 2012-04-22 DIAGNOSIS — Z7901 Long term (current) use of anticoagulants: Secondary | ICD-10-CM

## 2012-04-29 ENCOUNTER — Other Ambulatory Visit (INDEPENDENT_AMBULATORY_CARE_PROVIDER_SITE_OTHER): Payer: Medicare Other

## 2012-04-29 DIAGNOSIS — I10 Essential (primary) hypertension: Secondary | ICD-10-CM

## 2012-04-29 DIAGNOSIS — I5032 Chronic diastolic (congestive) heart failure: Secondary | ICD-10-CM

## 2012-04-29 LAB — BASIC METABOLIC PANEL
BUN: 21 mg/dL (ref 6–23)
Calcium: 9.4 mg/dL (ref 8.4–10.5)
Creatinine, Ser: 1.1 mg/dL (ref 0.4–1.2)
GFR: 50.41 mL/min — ABNORMAL LOW (ref >60.00)
Glucose, Bld: 86 mg/dL (ref 70–99)

## 2012-04-29 LAB — HEPATIC FUNCTION PANEL
Albumin: 3.1 g/dL — ABNORMAL LOW (ref 3.5–5.2)
Total Protein: 6.6 g/dL (ref 6.0–8.3)

## 2012-05-01 ENCOUNTER — Telehealth: Payer: Self-pay | Admitting: *Deleted

## 2012-05-01 DIAGNOSIS — I5032 Chronic diastolic (congestive) heart failure: Secondary | ICD-10-CM

## 2012-05-01 MED ORDER — POTASSIUM CHLORIDE CRYS ER 20 MEQ PO TBCR: 40.0000 meq | EXTENDED_RELEASE_TABLET | Freq: Every day | ORAL | Status: DC

## 2012-05-01 NOTE — Telephone Encounter (Signed)
lmptcb to increase K+ to 40 meq daily and bmet 2 weeks. I put in a tenative lab appt for 05/15/12, lm tcb if not a good day for lab appt

## 2012-05-01 NOTE — Telephone Encounter (Signed)
Message copied by Michae Kava on Fri May 01, 2012  3:25 PM ------      Message from: [place], [place] T      Created: Wed Apr 29, 2012  5:29 PM       She is on K+ 20 mEq QD.  Increase K+ to 40 mEq QD.      Check BMET in 2 weeks.      Richardson Dopp, PA-C  5:29 PM 04/29/2012

## 2012-05-04 ENCOUNTER — Ambulatory Visit (INDEPENDENT_AMBULATORY_CARE_PROVIDER_SITE_OTHER): Payer: Medicare Other | Admitting: *Deleted

## 2012-05-04 ENCOUNTER — Other Ambulatory Visit: Payer: Self-pay | Admitting: *Deleted

## 2012-05-04 DIAGNOSIS — I4891 Unspecified atrial fibrillation: Secondary | ICD-10-CM

## 2012-05-04 DIAGNOSIS — I10 Essential (primary) hypertension: Secondary | ICD-10-CM

## 2012-05-04 DIAGNOSIS — Z7901 Long term (current) use of anticoagulants: Secondary | ICD-10-CM

## 2012-05-04 DIAGNOSIS — I5032 Chronic diastolic (congestive) heart failure: Secondary | ICD-10-CM

## 2012-05-04 LAB — POCT INR: INR: 6

## 2012-05-04 LAB — PROTIME-INR: INR: 5.7 ratio (ref 0.8–1.0)

## 2012-05-04 MED ORDER — AMIODARONE HCL 200 MG PO TABS: 200.0000 mg | ORAL_TABLET | Freq: Two times a day (BID) | ORAL | Status: DC

## 2012-05-04 MED ORDER — POTASSIUM CHLORIDE CRYS ER 20 MEQ PO TBCR: 60.0000 meq | EXTENDED_RELEASE_TABLET | Freq: Every day | ORAL | Status: DC

## 2012-05-04 NOTE — Patient Instructions (Addendum)
Anticoagulation safety

## 2012-05-08 ENCOUNTER — Ambulatory Visit (INDEPENDENT_AMBULATORY_CARE_PROVIDER_SITE_OTHER): Payer: Medicare Other

## 2012-05-08 DIAGNOSIS — I4891 Unspecified atrial fibrillation: Secondary | ICD-10-CM

## 2012-05-08 DIAGNOSIS — Z7901 Long term (current) use of anticoagulants: Secondary | ICD-10-CM

## 2012-05-08 LAB — POCT INR: INR: 1.8

## 2012-05-13 ENCOUNTER — Ambulatory Visit (INDEPENDENT_AMBULATORY_CARE_PROVIDER_SITE_OTHER): Payer: Medicare Other | Admitting: Internal Medicine

## 2012-05-13 ENCOUNTER — Encounter: Payer: Self-pay | Admitting: Internal Medicine

## 2012-05-13 ENCOUNTER — Other Ambulatory Visit (INDEPENDENT_AMBULATORY_CARE_PROVIDER_SITE_OTHER): Payer: Medicare Other

## 2012-05-13 ENCOUNTER — Other Ambulatory Visit: Payer: Self-pay | Admitting: *Deleted

## 2012-05-13 VITALS — BP 153/86 | HR 82 | Ht 66.5 in | Wt 174.0 lb

## 2012-05-13 DIAGNOSIS — I495 Sick sinus syndrome: Secondary | ICD-10-CM

## 2012-05-13 DIAGNOSIS — I4949 Other premature depolarization: Secondary | ICD-10-CM

## 2012-05-13 DIAGNOSIS — I4891 Unspecified atrial fibrillation: Secondary | ICD-10-CM

## 2012-05-13 DIAGNOSIS — I5032 Chronic diastolic (congestive) heart failure: Secondary | ICD-10-CM

## 2012-05-13 DIAGNOSIS — I10 Essential (primary) hypertension: Secondary | ICD-10-CM

## 2012-05-13 DIAGNOSIS — I4819 Other persistent atrial fibrillation: Secondary | ICD-10-CM

## 2012-05-13 LAB — HEPATIC FUNCTION PANEL
Albumin: 3.4 g/dL — ABNORMAL LOW (ref 3.5–5.2)
Alkaline Phosphatase: 81 U/L (ref 39–117)
Bilirubin, Direct: 0.1 mg/dL (ref 0.0–0.3)

## 2012-05-13 LAB — BASIC METABOLIC PANEL
Calcium: 9.9 mg/dL (ref 8.4–10.5)
GFR: 52.02 mL/min — ABNORMAL LOW (ref >60.00)
Glucose, Bld: 90 mg/dL (ref 70–99)
Sodium: 141 mEq/L (ref 135–145)

## 2012-05-13 NOTE — Assessment & Plan Note (Signed)
The patient has symptomatic persistent atrial fibrillation.  She has failed medical therapy with flecainide and rhythmol.  She has recently begun amiodarone and has had no afib or side effects from the medicine since that time. Therapeutic strategies for afib including medicine and ablation were discussed in detail with the patient today. Risk, benefits, and alternatives to EP study and radiofrequency ablation for afib were also discussed in detail today.   At this time, she would like to continue medical therapy with amiodarone.  I have advised that she decrease amiodarone to 231m daily in 1 week.  She will then follow-up with Dr KCaryl Comesfor LFTs/TFTs in 6 weeks.  If she has further episodes of afib or does not tolerate amiodarone long term then she would favor ablation at that time. She will follow-up with Dr KCaryl Comesand I will see as needed.

## 2012-05-13 NOTE — Patient Instructions (Signed)
Your physician recommends that you schedule a follow-up appointment in: 6 weeks with Dr Caryl Comes  In 7 days decrease your Amiodarone to 262m daily

## 2012-05-13 NOTE — Progress Notes (Signed)
Primary Care Physician: [name], MD Referring Physician:  Dr Porfirio Oar is a 78 y.o. female with a h/o persistent atrial fibrillation who presents today for further evaluation and consideration for ablation.  She was originally diagnosed with atrial fibrillation several years ago after presenting to her primary care physician with fatigue, SOB, and palpitations.  She was found to have afib with RVR.  She was placed on coumadin and a beta blocker.  Initially afib would only last several hours at a time.  She had increasing frequency and duration of atrial fibrillation.  She was started on flecainide 2012 but did not find this helpful.  She did not tolerate this due to dizziness.  She began having presyncope and was discovered to have post termination pauses for which she underwent PPM (MDT) implantation by Dr Caryl Comes 1/13.  She was placed on rhythmol several months later. She required cardioversion 8/19 and 8/30 of this year.  Due to subsequent recurrence of afib, she was placed on amiodarone rhythmol was discontinued.  She underwent most recent cardioversion 04/15/12.  She has been in sinus rhythm since that time.  She reports feeling "much better".  Her exercise tolerance and energy is much improved.  She is unaware of difficulties with amiodarone thus far. Today, she denies symptoms of palpitations, chest pain, shortness of breath, orthopnea, PND, lower extremity edema, dizziness, presyncope, syncope, or neurologic sequela. The patient is tolerating medications without difficulties and is otherwise without complaint today.   Past Medical History  Diagnosis Date  . Persistent atrial fibrillation   . sinus node dysfunction//post termination pauses   . HTN (hypertension)   . CAD (coronary artery disease)     LHC 5/08:  pOM 20%, pRCA 20-30%, EF 60%  . NASH (nonalcoholic steatohepatitis)   . PMR (polymyalgia rheumatica)     no steriods for 5 years  . HLD (hyperlipidemia)   .  Neuromuscular disorder   . Hepatitis     hx of medication induced hepatitis (Vytorin per pt)  . Ascending aorta enlargement     4.8 cm per echo 08/07/11; 5.0 by CT in Jan 2013  . Pacemaker-Medtronic 11/12/2011    Implanted 2013   . Hx of echocardiogram     echo 1/13: EF 55%, Gr 2 diast dysfn, Asc Ao aneurysm 4.8 cm, mild MR, mod LAE, mild RAE   Past Surgical History  Procedure Date  . Umbilical hernia repair [id number]  . Incision and drainage breast abscess 1973  . Cardiac catheterization   . Hernia repair   . Pacemaker insertion Jan 2013  . Cardioversion 03/02/2012    Procedure: CARDIOVERSION;  Surgeon: Lelon Perla, MD;  Location: [facility] ENDOSCOPY;  Service: Cardiovascular;  Laterality: N/A;  . Cardioversion 03/13/2012    Procedure: CARDIOVERSION;  Surgeon: Josue Hector, MD;  Location: [facility] ENDOSCOPY;  Service: Cardiovascular;  Laterality: N/A;  . Cardioversion 04/15/2012    Procedure: CARDIOVERSION;  Surgeon: [name], MD;  Location: [facility] ENDOSCOPY;  Service: Cardiovascular;  Laterality: N/A;    Current Outpatient Prescriptions  Medication Sig Dispense Refill  . amiodarone (PACERONE) 200 MG tablet Take 1 tablet (200 mg total) by mouth 2 (two) times daily.      . Calcium Carbonate-Vitamin D (CALCIUM + D) 600-200 MG-UNIT TABS Take by mouth. 1 BID       . Cholecalciferol (VITAMIN D3) 1000 UNITS tablet Take 1,000 Units by mouth daily.        . furosemide (LASIX) 20 MG  tablet Take 1 tablet (20 mg total) by mouth daily.  30 tablet  6  . hydrochlorothiazide (MICROZIDE) 12.5 MG capsule Take 1 capsule (12.5 mg total) by mouth daily.  30 capsule  6  . multivitamin (THERAGRAN) per tablet Take 1 tablet by mouth daily.        . nadolol (CORGARD) 80 MG tablet Take 1 tablet (80 mg total) by mouth daily.  30 tablet  9  . potassium chloride SA (K-DUR,KLOR-CON) 20 MEQ tablet Take 3 tablets (60 mEq total) by mouth daily.  90 tablet  11  . valsartan (DIOVAN) 320 MG tablet Take 1 tablet (320 mg total)  by mouth daily.  30 tablet  6  . warfarin (COUMADIN) 2.5 MG tablet Take as directed by Anticoagulation clinic..  60 tablet  3  . WELCHOL 625 MG tablet         Allergies  Allergen Reactions  . Codeine     REACTION: feel funny  . Penicillins     REACTION: anaphylaxis  . Statins     REACTION: elevated LFT's  . Tylenol (Acetaminophen) Other (See Comments)    If taken with Vytorin at risk for liver damage    History   Social History  . Marital Status: Married    Spouse Name: N/A    Number of Children: N/A  . Years of Education: N/A   Occupational History  . Not on file.   Social History Main Topics  . Smoking status: Former Smoker    Types: Cigarettes    Quit date: 07/16/1990  . Smokeless tobacco: Never Used  . Alcohol Use: No  . Drug Use: No  . Sexually Active: No   Other Topics Concern  . Not on file   Social History Narrative   Pt lives in [place] with spouse.  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 Coronary artery disease    . Alzheimer's disease      ROS- All systems are reviewed and negative except as per the HPI above  Physical Exam: Filed Vitals:   05/13/12 1112  BP: 153/86  Pulse: 82  Height: 5' 6.5" (1.689 m)  Weight: 174 lb (78.926 kg)    GEN- The patient is well appearing, alert and oriented x 3 today.   Head- normocephalic, atraumatic Eyes-  Sclera clear, conjunctiva pink Ears- hearing intact Oropharynx- clear Neck- supple, no JVP Lymph- no cervical lymphadenopathy Lungs- Clear to ausculation bilaterally, normal work of breathing Heart- Regular rate and rhythm, no murmurs, rubs or gallops, PMI not laterally displaced GI- soft, NT, ND, + BS Extremities- no clubbing, cyanosis, or edema MS- no significant deformity or atrophy Skin- no rash or lesion Psych- euthymic mood, full affect Neuro- strength and sensation are intact  ekgs and echo are reviewed  today  Assessment and Plan:

## 2012-05-13 NOTE — Assessment & Plan Note (Signed)
Normal pacemaker function See Pace Art report No changes today  Device interrogation reveals no afib since starting amiodarone

## 2012-05-13 NOTE — Assessment & Plan Note (Signed)
Stable No change required today  

## 2012-05-18 ENCOUNTER — Ambulatory Visit (INDEPENDENT_AMBULATORY_CARE_PROVIDER_SITE_OTHER): Payer: Medicare Other | Admitting: *Deleted

## 2012-05-18 DIAGNOSIS — I4819 Other persistent atrial fibrillation: Secondary | ICD-10-CM

## 2012-05-18 DIAGNOSIS — I4891 Unspecified atrial fibrillation: Secondary | ICD-10-CM

## 2012-05-18 DIAGNOSIS — Z7901 Long term (current) use of anticoagulants: Secondary | ICD-10-CM

## 2012-05-25 ENCOUNTER — Ambulatory Visit (INDEPENDENT_AMBULATORY_CARE_PROVIDER_SITE_OTHER): Payer: Medicare Other | Admitting: Pharmacist

## 2012-05-25 DIAGNOSIS — I4819 Other persistent atrial fibrillation: Secondary | ICD-10-CM

## 2012-05-25 DIAGNOSIS — Z7901 Long term (current) use of anticoagulants: Secondary | ICD-10-CM

## 2012-05-25 DIAGNOSIS — I4891 Unspecified atrial fibrillation: Secondary | ICD-10-CM

## 2012-05-25 LAB — POCT INR: INR: 3.2

## 2012-06-08 ENCOUNTER — Ambulatory Visit (INDEPENDENT_AMBULATORY_CARE_PROVIDER_SITE_OTHER): Payer: Medicare Other | Admitting: Pharmacist

## 2012-06-08 DIAGNOSIS — I4891 Unspecified atrial fibrillation: Secondary | ICD-10-CM

## 2012-06-08 DIAGNOSIS — I4819 Other persistent atrial fibrillation: Secondary | ICD-10-CM

## 2012-06-08 DIAGNOSIS — Z7901 Long term (current) use of anticoagulants: Secondary | ICD-10-CM

## 2012-06-22 ENCOUNTER — Ambulatory Visit (INDEPENDENT_AMBULATORY_CARE_PROVIDER_SITE_OTHER): Payer: Medicare Other

## 2012-06-22 DIAGNOSIS — I4819 Other persistent atrial fibrillation: Secondary | ICD-10-CM

## 2012-06-22 DIAGNOSIS — I4891 Unspecified atrial fibrillation: Secondary | ICD-10-CM

## 2012-06-22 DIAGNOSIS — Z7901 Long term (current) use of anticoagulants: Secondary | ICD-10-CM

## 2012-06-22 LAB — POCT INR: INR: 2.7

## 2012-06-25 ENCOUNTER — Encounter: Payer: Self-pay | Admitting: Internal Medicine

## 2012-06-25 ENCOUNTER — Ambulatory Visit (INDEPENDENT_AMBULATORY_CARE_PROVIDER_SITE_OTHER): Payer: Medicare Other | Admitting: Internal Medicine

## 2012-06-25 VITALS — BP 132/70 | HR 65 | Resp 18 | Ht 67.0 in | Wt 180.8 lb

## 2012-06-25 DIAGNOSIS — R42 Dizziness and giddiness: Secondary | ICD-10-CM | POA: Insufficient documentation

## 2012-06-25 DIAGNOSIS — I5032 Chronic diastolic (congestive) heart failure: Secondary | ICD-10-CM

## 2012-06-25 DIAGNOSIS — I4891 Unspecified atrial fibrillation: Secondary | ICD-10-CM

## 2012-06-25 DIAGNOSIS — I495 Sick sinus syndrome: Secondary | ICD-10-CM

## 2012-06-25 DIAGNOSIS — I10 Essential (primary) hypertension: Secondary | ICD-10-CM

## 2012-06-25 LAB — BASIC METABOLIC PANEL
CO2: 31 mEq/L (ref 19–32)
Chloride: 101 mEq/L (ref 96–112)
Glucose, Bld: 70 mg/dL (ref 70–99)
Sodium: 137 mEq/L (ref 135–145)

## 2012-06-25 LAB — PACEMAKER DEVICE OBSERVATION
AL IMPEDENCE PM: 454 Ohm
ATRIAL PACING PM: 100
BATTERY VOLTAGE: 2.79 V
RV LEAD IMPEDENCE PM: 851 Ohm

## 2012-06-25 MED ORDER — HYDROCHLOROTHIAZIDE 12.5 MG PO CAPS: 25.0000 mg | ORAL_CAPSULE | Freq: Every day | ORAL | Status: DC

## 2012-06-25 MED ORDER — NADOLOL 80 MG PO TABS: 80.0000 mg | ORAL_TABLET | Freq: Every day | ORAL | Status: DC

## 2012-06-25 MED ORDER — POTASSIUM CHLORIDE CRYS ER 20 MEQ PO TBCR: 40.0000 meq | EXTENDED_RELEASE_TABLET | Freq: Every day | ORAL | Status: DC

## 2012-06-25 NOTE — Patient Instructions (Signed)
Stop Lasix.  Increase HCTZ to 36m daily.  Change Corgard to daily at bedtime.  Labs today:  BMET  Labs in Mid-January:  CMET and TSH  Labs in April:  Liver Panel and TSH  Your physician wants you to follow-up in: April with Dr. KCaryl Comes You will receive a reminder letter in the mail two months in advance. If you don't receive a letter, please call our office to schedule the follow-up appointment.

## 2012-06-25 NOTE — Assessment & Plan Note (Signed)
As above.

## 2012-06-25 NOTE — Assessment & Plan Note (Signed)
She is euvolemic and holding sinus rhythm

## 2012-06-25 NOTE — Assessment & Plan Note (Signed)
Is 100% atrially paced. I suspect that palpitations associated with changes in gait are related to the rate sense her. We discussed the programming the device; she would like to keep an eye on it.

## 2012-06-25 NOTE — Assessment & Plan Note (Addendum)
Sh e is currently on amiodarone having previously failed other antiarrhythmic drugs. She is tolerating it well. There is a little bit of dizziness which may be a manifestation of neurotoxicity we will have to keep our eye on this. It could also be related to orthostasis from hypertensive heart disease Amiodarone surveillance laboratories are doing January and I will see her again in April

## 2012-06-25 NOTE — Assessment & Plan Note (Signed)
The patient's device was interrogated.  The information was reviewed. No changes were made in the programming.    

## 2012-06-25 NOTE — Progress Notes (Signed)
Patient Care Team: Arlyss Repress, MD as PCP - General (Internal Medicine) [name], MD (Cardiology)   HPI  [name] [name] is a 78 y.o. female Seen in followup for atrial fibrillation in the setting of modest nonobstructive coronary disease and normal left ventricular function. She also has a thoracic aortic aneurysm followed by Dr. Servando Snare She is status post pacemaker implantation for sinus node dysfunction and posttermination pauses. She's been treated with flecainide and subsequently with Rythmol and most recently amiodarone. He saw Dr. Rayann Heman to consider catheter ablation but at this juncture would like to continue taking amiodarone. Liver function tests 10/13 with normal as was her TSH    She has a multitude of concerns. The first is that she has noticed her blood pressure, following cardioversion, has gone from 120/70 to about 138/85-90. In the context of her aneurysm she is concerned about that.  She has noted some lightheadedness which she thinks is new. It is while standing and often shortly after standing.  She has noted some palpitations associated with vigorous changes in gait. She is 100% atrially paced.  She also had an episode the other day that was quite brief where she was profoundly weak and thought that she had gone into atrial fibrillation. Her monitor in her pacemaker confirms one episode although there is no daytime standpoint.  She is not having any significant chest pain or shortness of breath and is otherwise tolerating her amiodarone well without complaints of nausea or cough  Past Medical History  Diagnosis Date  . Persistent atrial fibrillation   . sinus node dysfunction//post termination pauses   . HTN (hypertension)   . CAD (coronary artery disease)     LHC 5/08:  pOM 20%, pRCA 20-30%, EF 60%  . NASH (nonalcoholic steatohepatitis)   . PMR (polymyalgia rheumatica)     no steriods for 5 years  . HLD (hyperlipidemia)   . Neuromuscular disorder   .  Hepatitis     hx of medication induced hepatitis (Vytorin per pt)  . Ascending aorta enlargement     4.8 cm per echo 08/07/11; 5.0 by CT in Jan 2013  . Pacemaker-Medtronic 11/12/2011    Implanted 2013   . Hx of echocardiogram     echo 1/13: EF 55%, Gr 2 diast dysfn, Asc Ao aneurysm 4.8 cm, mild MR, mod LAE, mild RAE    Past Surgical History  Procedure Date  . Umbilical hernia repair 9470'J  . Incision and drainage breast abscess 1973  . Cardiac catheterization   . Hernia repair   . Pacemaker insertion Jan 2013  . Cardioversion 03/02/2012    Procedure: CARDIOVERSION;  Surgeon: Lelon Perla, MD;  Location: [facility] ENDOSCOPY;  Service: Cardiovascular;  Laterality: N/A;  . Cardioversion 03/13/2012    Procedure: CARDIOVERSION;  Surgeon: Josue Hector, MD;  Location: [facility] ENDOSCOPY;  Service: Cardiovascular;  Laterality: N/A;  . Cardioversion 04/15/2012    Procedure: CARDIOVERSION;  Surgeon: [name], MD;  Location: [facility] ENDOSCOPY;  Service: Cardiovascular;  Laterality: N/A;    Current Outpatient Prescriptions  Medication Sig Dispense Refill  . amiodarone (PACERONE) 200 MG tablet Take 1 tablet (200 mg total) by mouth 2 (two) times daily.      . Calcium Carbonate-Vitamin D (CALCIUM + D) 600-200 MG-UNIT TABS Take by mouth. 1 BID       . Cholecalciferol (VITAMIN D3) 1000 UNITS tablet Take 1,000 Units by mouth daily.        . furosemide (LASIX) 20 MG  tablet Take 1 tablet (20 mg total) by mouth daily.  30 tablet  6  . hydrochlorothiazide (MICROZIDE) 12.5 MG capsule Take 1 capsule (12.5 mg total) by mouth daily.  30 capsule  6  . multivitamin (THERAGRAN) per tablet Take 1 tablet by mouth daily.        . nadolol (CORGARD) 80 MG tablet Take 1 tablet (80 mg total) by mouth daily.  30 tablet  9  . potassium chloride SA (K-DUR,KLOR-CON) 20 MEQ tablet Take 3 tablets (60 mEq total) by mouth daily.  90 tablet  11  . valsartan (DIOVAN) 320 MG tablet Take 1 tablet (320 mg total) by mouth daily.  30  tablet  6  . warfarin (COUMADIN) 2.5 MG tablet Take as directed by Anticoagulation clinic..  60 tablet  3  . WELCHOL 625 MG tablet         Allergies  Allergen Reactions  . Codeine     REACTION: feel funny  . Penicillins     REACTION: anaphylaxis  . Statins     REACTION: elevated LFT's  . Tylenol (Acetaminophen) Other (See Comments)    If taken with Vytorin at risk for liver damage    Review of Systems negative except from HPI and PMH  Physical Exam BP 132/70  Pulse 65  Resp 18  Ht 5' 7"  (1.702 m)  Wt 180 lb 12.8 oz (82.01 kg)  BMI 28.32 kg/m2  SpO2 98% Well developed and well nourished in no acute distress HENT normal E scleral and icterus clear Neck Supple JVP flat; carotids brisk and full Clear to ausculation  Regular rate and rhythm, no murmurs gallops or rub Soft with active bowel sounds No clubbing cyanosis none Edema Alert and oriented, grossly normal motor and sensory function Skin Warm and Dry  ECG demonstrates atrial pacing at 62 intervals 16/09/43 Assessment and  Plan

## 2012-06-29 ENCOUNTER — Other Ambulatory Visit: Payer: Self-pay | Admitting: *Deleted

## 2012-06-29 DIAGNOSIS — I5032 Chronic diastolic (congestive) heart failure: Secondary | ICD-10-CM

## 2012-06-29 DIAGNOSIS — I4891 Unspecified atrial fibrillation: Secondary | ICD-10-CM

## 2012-06-29 DIAGNOSIS — I10 Essential (primary) hypertension: Secondary | ICD-10-CM

## 2012-06-29 DIAGNOSIS — I495 Sick sinus syndrome: Secondary | ICD-10-CM

## 2012-06-29 MED ORDER — HYDROCHLOROTHIAZIDE 25 MG PO TABS: 25.0000 mg | ORAL_TABLET | Freq: Every day | ORAL | Status: DC

## 2012-07-06 ENCOUNTER — Telehealth: Payer: Self-pay | Admitting: Internal Medicine

## 2012-07-06 NOTE — Telephone Encounter (Signed)
Pt states she is having slight increase in ankle/ feet edema since being taken off lasix and put on hctz. She wanted to take a lasix. Advised pt to continue to monitor her sodium restrictions, elevate legs 3-4 times a day for 15 minutes and wear support hose / non prescription. Pt agreed to plan and told her to call with further questions or concerns, pt agreed to plan.

## 2012-07-06 NOTE — Telephone Encounter (Signed)
Pt has had edema in ankles and feet and she wants to know can she take some lasix

## 2012-07-13 ENCOUNTER — Ambulatory Visit (INDEPENDENT_AMBULATORY_CARE_PROVIDER_SITE_OTHER): Payer: Medicare Other | Admitting: *Deleted

## 2012-07-13 ENCOUNTER — Other Ambulatory Visit (INDEPENDENT_AMBULATORY_CARE_PROVIDER_SITE_OTHER): Payer: Medicare Other

## 2012-07-13 DIAGNOSIS — Z7901 Long term (current) use of anticoagulants: Secondary | ICD-10-CM

## 2012-07-13 DIAGNOSIS — I4891 Unspecified atrial fibrillation: Secondary | ICD-10-CM

## 2012-07-13 DIAGNOSIS — I10 Essential (primary) hypertension: Secondary | ICD-10-CM

## 2012-07-13 LAB — BASIC METABOLIC PANEL
BUN: 22 mg/dL (ref 6–23)
Chloride: 106 mEq/L (ref 96–112)
Creatinine, Ser: 1 mg/dL (ref 0.4–1.2)

## 2012-07-13 LAB — POCT INR: INR: 2.7

## 2012-07-16 ENCOUNTER — Encounter (HOSPITAL_COMMUNITY): Payer: Self-pay | Admitting: Emergency Medicine

## 2012-07-16 ENCOUNTER — Emergency Department (HOSPITAL_COMMUNITY)
Admission: EM | Admit: 2012-07-16 | Discharge: 2012-07-16 | Disposition: A | Discharge status: Home / Self Care | Payer: PRIVATE HEALTH INSURANCE | Attending: Emergency Medicine | Admitting: Emergency Medicine

## 2012-07-16 ENCOUNTER — Emergency Department (HOSPITAL_COMMUNITY): Payer: PRIVATE HEALTH INSURANCE

## 2012-07-16 DIAGNOSIS — M25561 Pain in right knee: Secondary | ICD-10-CM

## 2012-07-16 DIAGNOSIS — S8990XA Unspecified injury of unspecified lower leg, initial encounter: Secondary | ICD-10-CM | POA: Insufficient documentation

## 2012-07-16 DIAGNOSIS — Z87891 Personal history of nicotine dependence: Secondary | ICD-10-CM | POA: Insufficient documentation

## 2012-07-16 DIAGNOSIS — Z7901 Long term (current) use of anticoagulants: Secondary | ICD-10-CM | POA: Insufficient documentation

## 2012-07-16 DIAGNOSIS — Z8679 Personal history of other diseases of the circulatory system: Secondary | ICD-10-CM | POA: Insufficient documentation

## 2012-07-16 DIAGNOSIS — S0003XA Contusion of scalp, initial encounter: Secondary | ICD-10-CM | POA: Insufficient documentation

## 2012-07-16 DIAGNOSIS — E785 Hyperlipidemia, unspecified: Secondary | ICD-10-CM | POA: Insufficient documentation

## 2012-07-16 DIAGNOSIS — Z950 Presence of cardiac pacemaker: Secondary | ICD-10-CM | POA: Insufficient documentation

## 2012-07-16 DIAGNOSIS — S20219A Contusion of unspecified front wall of thorax, initial encounter: Secondary | ICD-10-CM

## 2012-07-16 DIAGNOSIS — Z8619 Personal history of other infectious and parasitic diseases: Secondary | ICD-10-CM | POA: Insufficient documentation

## 2012-07-16 DIAGNOSIS — S99929A Unspecified injury of unspecified foot, initial encounter: Secondary | ICD-10-CM | POA: Insufficient documentation

## 2012-07-16 DIAGNOSIS — W19XXXA Unspecified fall, initial encounter: Secondary | ICD-10-CM

## 2012-07-16 DIAGNOSIS — W010XXA Fall on same level from slipping, tripping and stumbling without subsequent striking against object, initial encounter: Secondary | ICD-10-CM | POA: Insufficient documentation

## 2012-07-16 DIAGNOSIS — Z8719 Personal history of other diseases of the digestive system: Secondary | ICD-10-CM | POA: Insufficient documentation

## 2012-07-16 DIAGNOSIS — S59909A Unspecified injury of unspecified elbow, initial encounter: Secondary | ICD-10-CM | POA: Insufficient documentation

## 2012-07-16 DIAGNOSIS — S1093XA Contusion of unspecified part of neck, initial encounter: Secondary | ICD-10-CM | POA: Insufficient documentation

## 2012-07-16 DIAGNOSIS — I2510 Atherosclerotic heart disease of native coronary artery without angina pectoris: Secondary | ICD-10-CM | POA: Insufficient documentation

## 2012-07-16 DIAGNOSIS — Y9389 Activity, other specified: Secondary | ICD-10-CM | POA: Insufficient documentation

## 2012-07-16 DIAGNOSIS — S0083XA Contusion of other part of head, initial encounter: Secondary | ICD-10-CM

## 2012-07-16 DIAGNOSIS — Z8669 Personal history of other diseases of the nervous system and sense organs: Secondary | ICD-10-CM | POA: Insufficient documentation

## 2012-07-16 DIAGNOSIS — Y9229 Other specified public building as the place of occurrence of the external cause: Secondary | ICD-10-CM | POA: Insufficient documentation

## 2012-07-16 DIAGNOSIS — Z9861 Coronary angioplasty status: Secondary | ICD-10-CM | POA: Insufficient documentation

## 2012-07-16 DIAGNOSIS — Z79899 Other long term (current) drug therapy: Secondary | ICD-10-CM | POA: Insufficient documentation

## 2012-07-16 DIAGNOSIS — I10 Essential (primary) hypertension: Secondary | ICD-10-CM | POA: Insufficient documentation

## 2012-07-16 DIAGNOSIS — Z8739 Personal history of other diseases of the musculoskeletal system and connective tissue: Secondary | ICD-10-CM | POA: Insufficient documentation

## 2012-07-16 DIAGNOSIS — M25532 Pain in left wrist: Secondary | ICD-10-CM

## 2012-07-16 DIAGNOSIS — S6990XA Unspecified injury of unspecified wrist, hand and finger(s), initial encounter: Secondary | ICD-10-CM | POA: Insufficient documentation

## 2012-07-16 MED ORDER — OXYCODONE HCL 5 MG PO TABS: 5.0000 mg | ORAL_TABLET | ORAL | Status: DC | PRN

## 2012-07-16 NOTE — ED Notes (Signed)
Ortho at bedside.

## 2012-07-16 NOTE — ED Notes (Signed)
Ticket to ride placed on pts bed.

## 2012-07-16 NOTE — ED Notes (Signed)
Pt leaving ED in wheelchair. Pt has wrist splint applied by ortho tech before leaving ED. Pt has been educated on sign and symptoms that she needs to watch for and return to ED if certain symptoms occur. Pt verbalized understanding of d/c teaching and pt has no further questions. Pt does not appear to be in acute distress upon d/c.

## 2012-07-16 NOTE — Progress Notes (Signed)
Orthopedic Tech Progress Note Patient Details:  [name] [name] [birth year] [id number]  Ortho Devices Type of Ortho Device: Thumb velcro splint Ortho Device/Splint Location: (L) UE Ortho Device/Splint Interventions: Application   Braulio Bosch 07/16/2012, 7:20 PM

## 2012-07-16 NOTE — ED Provider Notes (Signed)
History     CSN: [id number]  Arrival date & time 07/16/12  1402   First MD Initiated Contact with Patient 07/16/12 1732      Chief Complaint  Patient presents with  . Fall    (Consider location/radiation/quality/duration/timing/severity/associated sxs/prior treatment) Patient is a 79 y.o. female presenting with fall. The history is provided by the patient.  Fall  She tripped and fell while in a supermarket and hit her head against the wall without loss of consciousness. She does so I injured her left wrist, left chest wall, and right knee. Pain is moderate to severe and she rates it at 7/10. She is on warfarin for atrial fibrillation last INR was done 3 days ago was 2.7. She denies headache, nausea, vomiting, dizziness, incoordination.  Past Medical History  Diagnosis Date  . Persistent atrial fibrillation   . sinus node dysfunction//post termination pauses   . HTN (hypertension)   . CAD (coronary artery disease)     LHC 5/08:  pOM 20%, pRCA 20-30%, EF 60%  . NASH (nonalcoholic steatohepatitis)   . PMR (polymyalgia rheumatica)     no steriods for 5 years  . HLD (hyperlipidemia)   . Neuromuscular disorder   . Hepatitis     hx of medication induced hepatitis (Vytorin per pt)  . Ascending aorta enlargement     4.8 cm per echo 08/07/11; 5.0 by CT in Jan 2013  . Pacemaker-Medtronic 11/12/2011    Implanted 2013   . Hx of echocardiogram     echo 1/13: EF 55%, Gr 2 diast dysfn, Asc Ao aneurysm 4.8 cm, mild MR, mod LAE, mild RAE    Past Surgical History  Procedure Date  . Umbilical hernia repair 2585'I  . Incision and drainage breast abscess 1973  . Cardiac catheterization   . Hernia repair   . Pacemaker insertion Jan 2013  . Cardioversion 03/02/2012    Procedure: CARDIOVERSION;  Surgeon: Lelon Perla, MD;  Location: [facility] ENDOSCOPY;  Service: Cardiovascular;  Laterality: N/A;  . Cardioversion 03/13/2012    Procedure: CARDIOVERSION;  Surgeon: Josue Hector, MD;  Location: [place]  ENDOSCOPY;  Service: Cardiovascular;  Laterality: N/A;  . Cardioversion 04/15/2012    Procedure: CARDIOVERSION;  Surgeon: [name], MD;  Location: [facility] ENDOSCOPY;  Service: Cardiovascular;  Laterality: N/A;    Family History  Problem Relation Age of Onset  . Coronary artery disease    . Alzheimer's disease      History  Substance Use Topics  . Smoking status: Former Smoker    Types: Cigarettes    Quit date: 07/16/1990  . Smokeless tobacco: Never Used  . Alcohol Use: No    OB History    Grav Para Term Preterm Abortions TAB SAB Ect Mult Living                  Review of Systems  All other systems reviewed and are negative.    Allergies  Codeine; Penicillins; Statins; and Tylenol  Home Medications   Current Outpatient Rx  Name  Route  Sig  Dispense  Refill  . AMIODARONE HCL 200 MG PO TABS   Oral   Take 200 mg by mouth daily.         Marland Kitchen CALCIUM CARBONATE-VITAMIN D 600-200 MG-UNIT PO TABS   Oral   Take by mouth. 1 BID          . VITAMIN D3 1000 UNITS PO TABS   Oral   Take 1,000 Units by mouth daily.           Marland Kitchen  HYDROCHLOROTHIAZIDE 25 MG PO TABS   Oral   Take 1 tablet (25 mg total) by mouth daily.   90 tablet   3     Please file Rx for next refill.   . MULTIVITAMINS PO TABS   Oral   Take 1 tablet by mouth daily.           Marland Kitchen NADOLOL 80 MG PO TABS   Oral   Take 1 tablet (80 mg total) by mouth at bedtime.   30 tablet   9   . POTASSIUM CHLORIDE CRYS ER 20 MEQ PO TBCR   Oral   Take 2 tablets (40 mEq total) by mouth daily.   90 tablet   11   . VALSARTAN 320 MG PO TABS   Oral   Take 1 tablet (320 mg total) by mouth daily.   30 tablet   6   . WARFARIN SODIUM 2.5 MG PO TABS      Take as directed by Anticoagulation clinic..   60 tablet   3   . WELCHOL 625 MG PO TABS   Oral   Take 625 mg by mouth 2 (two) times daily with a meal.            BP 151/73  Pulse 62  Temp 98.7 F (37.1 C) (Oral)  Resp 16  SpO2 97%  Physical Exam   Nursing note and vitals reviewed. 79 year old female, resting comfortably and in no acute distress. Vital signs are significant for hypertension with blood pressure 121/73. Oxygen saturation is 97%, which is normal. Head is normocephalic and atraumatic. PERRLA, EOMI. Oropharynx is clear. Neck is nontender without adenopathy or JVD. Back is nontender and there is no CVA tenderness. Lungs are clear without rales, wheezes, or rhonchi. Chest is mildly tender on the left side laterally without any point tenderness. Heart has regular rate and rhythm without murmur. Abdomen is soft, flat, nontender without masses or hepatosplenomegaly and peristalsis is normoactive. Extremities have no cyanosis or edema, full range of motion is present. There is mild swelling of the right knee without any localized tenderness. There is no instability of the right knee. Left wrist has mild tenderness on the radial side with some tenderness in the anatomic snuffbox, but note tenderness with axial loading of the thumb. There is no significant swelling of the left wrist and full passive range of motion is present. Skin is warm and dry without rash. Neurologic: Mental status is normal, cranial nerves are intact, there are no motor or sensory deficits.\   ED Course  Procedures (including critical care time)  Results for orders placed in visit on 01/75/10  BASIC METABOLIC PANEL      Component Value Range   Sodium 142  135 - 145 mEq/L   Potassium 3.8  3.5 - 5.1 mEq/L   Chloride 106  96 - 112 mEq/L   CO2 30  19 - 32 mEq/L   Glucose, Bld 91  70 - 99 mg/dL   BUN 22  6 - 23 mg/dL   Creatinine, Ser 1.0  0.4 - 1.2 mg/dL   Calcium 9.5  8.4 - 10.5 mg/dL   GFR 57.49 (*) >60.00 mL/min   Dg Ribs Unilateral W/chest Left  07/16/2012  *RADIOLOGY REPORT*  Clinical Data: Fall, left mid and lower rib pain  LEFT RIBS AND CHEST - 3+ VIEW  Comparison: Prior CT chest 01/28/2012  Findings: Unchanged mild cardiomegaly.  Aortic  atherosclerosis. Left subclavian cardiac rhythm maintenance device with  leads projecting right atrium and right ventricle.  Negative for pulmonary edema, focal airspace consolidation, pleural effusion or pneumothorax.  No focal displaced rib fracture.  Extensive costochondral calcifications.  IMPRESSION:  1.  No acute cardiopulmonary disease 2.  No acute rib fracture identified   Original Report Authenticated By: Jacqulynn Cadet, M.D.    Dg Wrist Complete Left  07/16/2012  *RADIOLOGY REPORT*  Clinical Data: Left wrist pain secondary to a fall.  LEFT WRIST - COMPLETE 3+ VIEW  Comparison: None.  Findings: No fracture or dislocation.  Osteopenia.  Arthritis at the first carpal metacarpal joint.  IMPRESSION: No acute abnormality.   Original Report Authenticated By: Lorriane Shire, M.D.    Ct Head Wo Contrast  07/16/2012  *RADIOLOGY REPORT*  Clinical Data: Fall.  CT HEAD WITHOUT CONTRAST  Technique:  Contiguous axial images were obtained from the base of the skull through the vertex without contrast.  Comparison: None.  Findings: No skull fracture or intracranial hemorrhage.  No CT evidence of large acute infarct.  No intracranial mass lesion detected on this unenhanced exam.  No hydrocephalus.  Vascular calcifications.  Orbital structures intact.  Visualized paranasal sinuses, mastoid air cells and middle ear cavities are clear.  IMPRESSION: No skull fracture or intracranial hemorrhage.   Original Report Authenticated By: Genia Del, M.D.    Dg Knee Complete 4 Views Right  07/16/2012  *RADIOLOGY REPORT*  Clinical Data: [place] hitting knee.  Pain just below patella anteriorly.  RIGHT KNEE - COMPLETE 4+ VIEW  Comparison: None.  Findings: There is no fracture or dislocation.  Joint space narrowing is prominent medially.  There is a small suprapatellar bursa effusion.  In an infrapatellar location, there is an area of rounded soft tissue calcification, possibly dystrophic related to prior trauma. It does not represent  an acute bony fragment from the patella.  IMPRESSION: Chronic changes as described.  Small effusion.  No visible fracture.   Original Report Authenticated By: Rolla Flatten, M.D.     Images viewed by me.   1. Fall   2. Chest wall contusion   3. Pain in left wrist   4. Pain in right knee   5. Forehead contusion       MDM  Fall with injury to the head, left chest, left wrist, right knee. X-rays of ribs and right knee are unremarkable. Wrist x-ray has been ordered. CT of the head is unremarkable. However, because she is on warfarin, she is advised of the need to observe for signs of delayed bleeding for the next 24-48 hours. She is placed in a wrist splint and is to maintain that until she is seen by hand surgery and followup. Followup appointment with hand surgery in 5 days. Prescription is given for oxycodone for pain.        Delora Fuel, MD 91/69/45 [id number]

## 2012-07-16 NOTE — ED Notes (Addendum)
Pt states she accidently hit her head on a wall while walking at the grocery store just prior to arrival. Pt reports she fell down to the ground. Pt did not pass out. Also c/o pain to left ribs and right knee. Pt does take coumadin. No neuro deficits noted. NAD.

## 2012-07-16 NOTE — ED Notes (Signed)
Pt states she tripped over an item in the grocery store. Pt states she hit her head on wall during fall. Pt denies numbness and tingling. Pt denies nausea. Pt denies blurred vision. Pt mentating appropriately. Pt able to move all extremities.

## 2012-07-16 NOTE — ED Notes (Signed)
Paged Ortho

## 2012-07-29 ENCOUNTER — Other Ambulatory Visit (INDEPENDENT_AMBULATORY_CARE_PROVIDER_SITE_OTHER): Payer: Medicare Other

## 2012-07-29 DIAGNOSIS — I4891 Unspecified atrial fibrillation: Secondary | ICD-10-CM

## 2012-07-29 LAB — COMPREHENSIVE METABOLIC PANEL
Albumin: 3.2 g/dL — ABNORMAL LOW (ref 3.5–5.2)
BUN: 17 mg/dL (ref 6–23)
CO2: 30 mEq/L (ref 19–32)
Calcium: 9.4 mg/dL (ref 8.4–10.5)
Chloride: 107 mEq/L (ref 96–112)
Creatinine, Ser: 1 mg/dL (ref 0.4–1.2)
GFR: 60.29 mL/min (ref >60.00)
Glucose, Bld: 99 mg/dL (ref 70–99)
Potassium: 4.1 mEq/L (ref 3.5–5.1)

## 2012-07-29 LAB — TSH: TSH: 1.64 u[IU]/mL (ref 0.35–5.50)

## 2012-08-04 ENCOUNTER — Encounter: Payer: Medicare Other | Admitting: Internal Medicine

## 2012-08-10 ENCOUNTER — Ambulatory Visit (INDEPENDENT_AMBULATORY_CARE_PROVIDER_SITE_OTHER): Payer: Medicare Other

## 2012-08-10 DIAGNOSIS — Z7901 Long term (current) use of anticoagulants: Secondary | ICD-10-CM

## 2012-08-10 DIAGNOSIS — I4891 Unspecified atrial fibrillation: Secondary | ICD-10-CM

## 2012-08-10 LAB — POCT INR: INR: 2.3

## 2012-08-18 ENCOUNTER — Telehealth: Payer: Self-pay | Admitting: Internal Medicine

## 2012-08-18 NOTE — Telephone Encounter (Signed)
New problem   Patient calling wants to know can she take a lasix . If so should an extra potassium be taken as well.     C/O swelling in ankle & feet .

## 2012-08-18 NOTE — Telephone Encounter (Signed)
I spoke with the patient. She states she has noticed her weight going up a little, but she is unsure if this is caloric intake versus fluid. She does have a little edema in her ankles to report. She is off lasix daily, but wanted to know if she could take a lasix 20 mg x one dose to see how this helps her swelling. I have advised this is ok. K+ was 4.1 on 07/29/12. I advised a little extra potassium in her diet when taking the lasix. She verbalizes understanding.

## 2012-08-24 ENCOUNTER — Other Ambulatory Visit: Payer: Self-pay | Admitting: *Deleted

## 2012-08-24 DIAGNOSIS — I7789 Other specified disorders of arteries and arterioles: Secondary | ICD-10-CM

## 2012-09-07 ENCOUNTER — Ambulatory Visit (INDEPENDENT_AMBULATORY_CARE_PROVIDER_SITE_OTHER): Payer: Medicare Other

## 2012-09-07 DIAGNOSIS — I4891 Unspecified atrial fibrillation: Secondary | ICD-10-CM

## 2012-09-07 DIAGNOSIS — Z7901 Long term (current) use of anticoagulants: Secondary | ICD-10-CM

## 2012-09-07 LAB — POCT INR: INR: 2.6

## 2012-09-14 ENCOUNTER — Other Ambulatory Visit: Payer: Self-pay | Admitting: Cardiothoracic Surgery

## 2012-09-14 LAB — BUN: BUN: 20 mg/dL (ref 6–23)

## 2012-09-14 LAB — CREATININE, SERUM: Creat: 0.9 mg/dL (ref 0.50–1.10)

## 2012-09-17 ENCOUNTER — Other Ambulatory Visit: Payer: Medicare Other

## 2012-09-17 ENCOUNTER — Ambulatory Visit: Payer: Medicare Other | Admitting: Cardiothoracic Surgery

## 2012-09-22 ENCOUNTER — Other Ambulatory Visit: Payer: Self-pay | Admitting: Emergency Medicine

## 2012-09-22 MED ORDER — WARFARIN SODIUM 2.5 MG PO TABS: ORAL_TABLET | ORAL | Status: DC

## 2012-09-22 NOTE — Telephone Encounter (Signed)
Refill request from [facility]

## 2012-09-23 ENCOUNTER — Ambulatory Visit
Admission: RE | Admit: 2012-09-23 | Discharge: 2012-09-23 | Disposition: A | Discharge status: Home / Self Care | Payer: Medicare Other | Source: Ambulatory Visit | Attending: Cardiothoracic Surgery | Admitting: Cardiothoracic Surgery

## 2012-09-23 DIAGNOSIS — I7789 Other specified disorders of arteries and arterioles: Secondary | ICD-10-CM

## 2012-09-23 MED ORDER — IOHEXOL 350 MG/ML SOLN
80.0000 mL | Freq: Once | INTRAVENOUS | Status: AC | PRN
  Administered 2012-09-23: 80 mL via INTRAVENOUS

## 2012-09-28 ENCOUNTER — Other Ambulatory Visit: Payer: Self-pay

## 2012-09-28 ENCOUNTER — Ambulatory Visit (INDEPENDENT_AMBULATORY_CARE_PROVIDER_SITE_OTHER): Payer: Medicare Other | Admitting: *Deleted

## 2012-09-28 DIAGNOSIS — Z950 Presence of cardiac pacemaker: Secondary | ICD-10-CM

## 2012-09-28 DIAGNOSIS — I495 Sick sinus syndrome: Secondary | ICD-10-CM

## 2012-10-04 LAB — REMOTE PACEMAKER DEVICE
AL IMPEDENCE PM: 435 Ohm
AL THRESHOLD: 0.625 V
ATRIAL PACING PM: 100
BAMS-0001: 150 {beats}/min
BATTERY VOLTAGE: 2.79 V
RV LEAD AMPLITUDE: 22.4 mv
RV LEAD IMPEDENCE PM: 847 Ohm
RV LEAD THRESHOLD: 0.625 V
VENTRICULAR PACING PM: 1

## 2012-10-05 ENCOUNTER — Ambulatory Visit (INDEPENDENT_AMBULATORY_CARE_PROVIDER_SITE_OTHER): Payer: Medicare Other | Admitting: *Deleted

## 2012-10-05 DIAGNOSIS — I4891 Unspecified atrial fibrillation: Secondary | ICD-10-CM

## 2012-10-05 DIAGNOSIS — Z7901 Long term (current) use of anticoagulants: Secondary | ICD-10-CM

## 2012-10-05 LAB — POCT INR: INR: 4

## 2012-10-08 ENCOUNTER — Telehealth: Payer: Self-pay | Admitting: Internal Medicine

## 2012-10-08 NOTE — Telephone Encounter (Signed)
New problem     Pt has question about a medication-wants to make sure it doesn't interfere with her coumadin

## 2012-10-15 ENCOUNTER — Encounter: Payer: Self-pay | Admitting: *Deleted

## 2012-10-15 ENCOUNTER — Ambulatory Visit (INDEPENDENT_AMBULATORY_CARE_PROVIDER_SITE_OTHER): Payer: Medicare Other | Admitting: Cardiothoracic Surgery

## 2012-10-15 ENCOUNTER — Encounter: Payer: Self-pay | Admitting: Cardiothoracic Surgery

## 2012-10-15 VITALS — BP 138/70 | HR 83 | Resp 20 | Ht 67.0 in | Wt 182.0 lb

## 2012-10-15 DIAGNOSIS — I712 Thoracic aortic aneurysm, without rupture: Secondary | ICD-10-CM

## 2012-10-15 DIAGNOSIS — I7789 Other specified disorders of arteries and arterioles: Secondary | ICD-10-CM

## 2012-10-15 NOTE — Patient Instructions (Signed)
Thoracic Aortic Aneurysm An aneurysm is the enlargement (dilatation), bulging or ballooning out of part of the wall of a vein or artery. An Aortic Aneurysm is a bulging in the largest artery of the body. This artery supplies blood from the heart to the rest of the body. The first part of the aorta is called the thoracic aorta. It leaves the heart, ascends (rises), arches, and descends (goes down) through the chest until it reaches the diaphragm (the muscular partition between the chest and abdomen (belly). The second part of the aorta is then called the abdominal aorta after it has passed the diaphragm and continues down through the abdomen. The abdominal aorta ends where it splits to form the two iliac arteries that go to the legs. Aortic aneurysms can develop anywhere along the length of the aorta. A thoracic aortic aneurysm (TAA) occurs in the first part of the aorta, between the heart and the diaphragm. The major importance of an aneurysm is that it can rupture or tear (dissect), causing death unless diagnosed and treated promptly. CAUSES  Most thoracic aortic aneurysms are related to arteriosclerosis. The arteriosclerosis can weaken the aortic wall. The pressure of the blood being pumped through the aorta causes it to balloon out at the site of weakness. Therefore, elevated blood pressure (hypertension) is associated with aneurysm. Other risk factors include:  Age over 23.  Tobacco use.  Female sex.  Family history of aneurysm. Additional causes of thoracic aortic aneurysms include:  Genetics (passed by birth).  Injury: After physical trauma to the aorta.  Inflammation of blood vessels.  Hardening of the arteries.  Infection. SYMPTOMS  Many aneurysms do not cause problems. A small, unchanging or slowly changing aneurysm may produce no symptoms until it suddenly ruptures or dissects (separation of the layers of the aortic wall) without warning. It may then cause death. The symptoms  (problems) of a developing aneurysm will partly depend on its size and rate of growth. Thoracic aortic aneurysms may cause pain in the:  Chest.  Back.  Sides.  Abdomen. The pain most often has a deep quality as if it is boring into the person. It may cause:  Heart failure.  Heart attack.  Hoarseness.  Cough.  Shortness of breath.  Swallowing problems. DIAGNOSIS  A thoracic aortic aneurysm may be suspected based on your symptoms. It may also be detected by x-ray or CT studies done for unrelated reasons.  Several different imaging studies can be used to confirm a TAA:  An echocardiogram is an ultrasound test to examine the heart. It can also examine the first parts of the aorta. Sometimes, this test is done by putting you to sleep and inserting a flexible telescope through your mouth into your esophagus, which is next to the aorta; excellent pictures of the aorta can be obtained. This is called a transesophageal echocardiogram (TEE).  CT scanning of the chest is accurate at showing the exact size and shape of the aneurysm.  MRI scanning is accurate, and is used for certain types of TAA.  An aortic angiogram shows the source of the major blood vessels arising from the aorta. It reveals the size and extent of any aneurysm. It can also show a clot clinging to the wall of the aneurysm (mural thrombus). The angiogram may give information about a tear of the aorta. TREATMENT  Treatment for a thoracic aortic aneurysm depends on:  Location.  Size.  Other factors.  Rate of growth.  Underlying cause. Medical treatment is used  for smaller or complicated aneurysms, or those that do not cause symptoms. These include:  Stopping smoking.  Blood pressure control.  Control of cholesterol. Surgical treatment is used for aneurysms that cause symptoms, or for those that are large or growing in size. The surgical technique depends on the location of the aneurysm. HOME CARE INSTRUCTIONS     If you smoke, stop. If you do not, do not start.  Take all medications as prescribed.  Your caregiver will tell you when to have your aneurysm rechecked, either by echocardiogram or CT scan. Be sure to keep this and all follow-up appointments. SEEK MEDICAL CARE IF:   You develop mild pain in your chest, upper back, sides, or abdomen.  You develop cough, hoarseness or trouble swallowing. SEEK IMMEDIATE MEDICAL CARE IF:   You develop severe chest or abdominal pain, or severe pain moving (radiating) to your back.  You suddenly develop cold or blue toes or feet.  You suddenly develop lightheadedness or fainting spells.  You develop trouble breathing. Document Released: 07/01/2005 Document Revised: 09/23/2011 Document Reviewed: 05/20/2007 [facility] Patient Information 2013 [place].

## 2012-10-15 NOTE — Progress Notes (Signed)
[address]            [place] [address]          [phone]      Harlow W Schmale  Medical Record #[id number] Date of Birth: 04/11/1931  Referring: Arlyss Repress., MD Primary Care: [name], MD  Chief Complaint:    Chief Complaint  Patient presents with  . Thoracic Aortic Aneurysm    8 month f/u with CTA Chest surveillance on dilated ascending aortic aneurysm    History of Present Illness:    Patient is a 79 year old female with a history of atrial fib on Coumadin. On January 14 a pacemaker was placed. In followup an echocardiogram was performed that demonstrated dilated descending aorta. Further evaluation with a CT scan of the chest was performed. Patient's referred for evaluation of dilated acescending aorta. She has no history of aortic valve disease.  The patient returns today with a followup CT scan for comparison to the scan done in 8 months ago.  She has no family history of aneurysm disease or sudden death, her grandfather did die in his 64s of a cerebral aneurysm.  She has been diligent about blood pressure control and taking her beta blocker.   Patient denies any shortness of breath or chest discomfort, she has had several episodes of cardioversion over the past 8 months.  Current Activity/ Functional Status: Patient is independent with mobility/ambulation, transfers, ADL's, IADL's.   Past Medical History  Diagnosis Date  . Persistent atrial fibrillation   . sinus node dysfunction//post termination pauses   . HTN (hypertension)   . CAD (coronary artery disease)     LHC 5/08:  pOM 20%, pRCA 20-30%, EF 60%  . NASH (nonalcoholic steatohepatitis)   . PMR (polymyalgia rheumatica)     no steriods for 5 years  . HLD (hyperlipidemia)   . Neuromuscular disorder   . Hepatitis     hx of medication induced hepatitis (Vytorin per pt)  . Ascending aorta enlargement     4.8 cm per echo 08/07/11; 5.0 by CT in Jan 2013  .  Pacemaker-Medtronic 11/12/2011    Implanted 2013   . Hx of echocardiogram     echo 1/13: EF 55%, Gr 2 diast dysfn, Asc Ao aneurysm 4.8 cm, mild MR, mod LAE, mild RAE    Past Surgical History  Procedure Laterality Date  . Umbilical hernia repair  1950's  . Incision and drainage breast abscess  1973  . Cardiac catheterization    . Hernia repair    . Pacemaker insertion  Jan 2013  . Cardioversion  03/02/2012    Procedure: CARDIOVERSION;  Surgeon: Lelon Perla, MD;  Location: [facility] ENDOSCOPY;  Service: Cardiovascular;  Laterality: N/A;  . Cardioversion  03/13/2012    Procedure: CARDIOVERSION;  Surgeon: Josue Hector, MD;  Location: [facility] ENDOSCOPY;  Service: Cardiovascular;  Laterality: N/A;  . Cardioversion  04/15/2012    Procedure: CARDIOVERSION;  Surgeon: [name], MD;  Location: [facility] ENDOSCOPY;  Service: Cardiovascular;  Laterality: N/A;    Family History  Problem Relation Age of Onset  . Coronary artery disease    . Alzheimer's disease        History  Smoking status  . Former Smoker  . Types: Cigarettes  . Quit date: 07/16/1990  Smokeless tobacco  . Never Used    History  Alcohol Use No  Allergies  Allergen Reactions  . Codeine     REACTION: feel funny  . Penicillins     REACTION: anaphylaxis  . Statins     REACTION: elevated LFT's  . Tylenol (Acetaminophen) Other (See Comments)    If taken with Vytorin at risk for liver damage    Current Outpatient Prescriptions  Medication Sig Dispense Refill  . amiodarone (PACERONE) 200 MG tablet Take 200 mg by mouth daily.      . Calcium Carbonate-Vitamin D (CALCIUM + D) 600-200 MG-UNIT TABS Take 1 tablet by mouth 2 (two) times daily.       . cetirizine (ZYRTEC) 10 MG tablet Take 10 mg by mouth as needed.       . Cholecalciferol (VITAMIN D3) 1000 UNITS tablet Take 1,000 Units by mouth daily.        . furosemide (LASIX) 20 MG tablet Take 20 mg by mouth daily.      . hydrochlorothiazide (HYDRODIURIL) 25 MG tablet  Take 25 mg by mouth daily.      . multivitamin (THERAGRAN) per tablet Take 1 tablet by mouth daily.        . nadolol (CORGARD) 40 MG tablet Take 80 mg by mouth every morning.      . potassium chloride SA (K-DUR,KLOR-CON) 20 MEQ tablet Take 40 mEq by mouth daily.      . valsartan (DIOVAN) 320 MG tablet Take 1 tablet (320 mg total) by mouth daily.  30 tablet  6  . warfarin (COUMADIN) 2.5 MG tablet Take as directed by coumadin clinic  40 tablet  3  . WELCHOL 625 MG tablet Take 625 mg by mouth 2 (two) times daily with a meal.        No current facility-administered medications for this visit.       Review of Systems:     Cardiac Review of Systems: Y or N  Chest Pain [ n   ]  Resting SOB [   ] Exertional SOB  [n  ]  Orthopnea Florencio.Farrier ]   Pedal Edema [ n  ]    Palpitations Blue.Reese  ] Syncope  Florencio.Farrier  ]   Presyncope Florencio.Farrier   ]  General Review of Systems: [Y] = yes [  ]=no Constitional: recent weight change [  ]; anorexia [  ]; fatigue [  ]; nausea [  ]; night sweats [  ]; fever [  ]; or chills [  ];                                                                                                                                          Dental: poor dentition[  n]; Last Dentist visit: regular every 6 months  Eye : blurred vision [  ]; diplopia [   ]; vision changes [  ];  Amaurosis fugax[  ]; Resp: cough [  ];  wheezing[  ];  hemoptysis[  ]; shortness of breath[  ]; paroxysmal nocturnal dyspnea[  ]; dyspnea on exertion[  ]; or orthopnea[  ];  GI:  gallstones[  ], vomiting[  ];  dysphagia[  ]; melena[  ];  hematochezia [  ]; heartburn[  ];   Hx of  Colonoscopy[  ]; GU: kidney stones [  ]; hematuria[  ];   dysuria [  ];  nocturia[  ];  history of     obstruction [  ];             Skin: rash, swelling[  ];, hair loss[  ];  peripheral edema[  ];  or itching[  ]; Musculosketetal: myalgias[ y ];  joint swelling[ y ];  joint erythema[  ];  joint pain[  ];  back pain[  ];  Heme/Lymph: bruising[ n ];  bleeding[  n];   anemia[  ];  Neuro: TIA[  ];  headaches[  ];  stroke[  ];  vertigo[  ];  seizures[  ];   paresthesias[  ];  difficulty walking[  ];  Psych:depression[  ]; anxiety[  ];  Endocrine: diabetes[ n ];  thyroid dysfunction[ n ];  Immunizations: Flu Blue.Reese  ]; Pneumococcal[y  ];  Other:  Physical Exam: BP 138/70  Pulse 83  Resp 20  Ht 5' 7"  (1.702 m)  Wt 182 lb (82.555 kg)  BMI 28.5 kg/m2  SpO2 98%  General appearance: alert, cooperative, appears stated age and no distress Neurologic: intact Heart: irregularly irregular rhythm, she has no murmur of aortic insufficiency Lungs: clear to auscultation bilaterally Abdomen: soft, non-tender; bowel sounds normal; no masses,  no organomegaly Extremities: extremities normal, atraumatic, no cyanosis or edema and Homans sign is negative, no sign of DVT   Diagnostic Studies & Laboratory data:     Recent Radiology Findings:  Ct Angio Chest W/cm &/or Wo Cm  09/23/2012  *RADIOLOGY REPORT*  Clinical Data: Follow-up ascending aortic aneurysm.  CT ANGIOGRAPHY CHEST  Technique:  Multidetector CT imaging of the chest using the standard protocol during bolus administration of intravenous contrast. Multiplanar reconstructed images including MIPs were obtained and reviewed to evaluate the vascular anatomy.  Contrast: 46m OMNIPAQUE IOHEXOL 350 MG/ML SOLN  Comparison: 01/28/2012.  Findings: Low attenuation lesions in the thyroid measure up to 1.4 cm on the right, as before.  Ascending aorta measures up to 4.9 cm, unchanged from the prior examination.  Transverse aorta measures up to 3.2 cm and proximal descending thoracic aorta, 3.1 cm, also unchanged.  There is atherosclerotic irregularity and calcification of the aorta with calcification seen in the coronary arteries. Heart is mildly enlarged.  No pericardial effusion.  No pathologically enlarged mediastinal, hilar or axillary lymph nodes.  Scattered scarring in the right lower lobe and along the left major fissure.  No  pleural fluid.  Airway is unremarkable.  Incidental imaging of the upper abdomen shows no acute findings. No worrisome lytic or sclerotic lesions.  IMPRESSION:  1.  Stable ascending aortic aneurysm. Transverse and descending thoracic aortic diameters are stable as well. 2.  Coronary artery calcification.   Original Report Authenticated By: MLorin Picket M .     ECHO: Transthoracic Echocardiography  Patient: WJenna, RoutzahnMR #: [id number] Date: 08/07/2011 Gender: F Age: [age]Height: 167.6cm Weight: 78.5kg BSA: 1.833m Pt. Status: Room:  SONOGRAPHER PaCharlann NossTTENDING DaLoralie ChampagneMD PERFORMING MoZacarias PontesSite 3 ORDERING KlVirl AxeECarney Livingc: Dr. JaJani Gravel------------------------------------------------------------ LV EF: 55% - 60%  ------------------------------------------------------------  Indications: Atrial fibrillation - 427.31.  ------------------------------------------------------------ History: PMH: Acquired from the patient and from the patient's chart. Atrial fibrillation. Risk factors: Hypertension. Dyslipidemia.  ------------------------------------------------------------ Study Conclusions  - Left ventricle: The cavity size was normal. Wall thickness was normal. Systolic function was normal. The estimated ejection fraction was 55%. Although no diagnostic regional wall motion abnormality was identified, this possibility cannot be completely excluded on the basis of this study. Features are consistent with a pseudonormal left ventricular filling pattern, with concomitant abnormal relaxation and increased filling pressure (grade 2 diastolic dysfunction). - Aortic valve: There was no stenosis. - Aorta: Normal aortic root size. Ascending aortic aneurysm reaching 4.8 cm. - Mitral valve: Mild regurgitation. - Left atrium: The atrium was moderately dilated. - Right ventricle: The cavity size was normal. Pacer wire  or catheter noted in right ventricle. Systolic function was normal. - Right atrium: The atrium was mildly dilated. - Tricuspid valve: Peak RV-RA gradient:13m Hg (S). - Pulmonary arteries: PA systolic pressure 263-14mmHg. - Systemic veins: IVC measured 2.0 cm with normal respirophasic variation, suggesting RA pressure 6-10 mmHg. - Pericardium, extracardiac: A trivial pericardial effusion was identified. Impressions:  - Normal LV size and systolic function, EF 597% Moderate diastolic dysfunction. Biatrial enlargement. Normal RV size and systolic function. Mild mitral regurgitation. Ascending aortic aneurysm reaching 4.8 cm by echo. Would consider MRA chest to fully assess thoracic aorta. Transthoracic echocardiography. M-mode, complete 2D, spectral Doppler, and color Doppler. Height: Height: 167.6cm. Height: 66in. Weight: Weight: 78.5kg. Weight: 172.6lb. Body mass index: BMI: 27.9kg/m^2. Body surface area: BSA: 1.849m. Blood pressure: 164/83. Patient status: Outpatient. Location: [facility]  ------------------------------------------------------------  ------------------------------------------------------------ Left ventricle: The cavity size was normal. Wall thickness was normal. Systolic function was normal. The estimated ejection fraction was 55%. Although no diagnostic regional wall motion abnormality was identified, this possibility cannot be completely excluded on the basis of this study. Features are consistent with a pseudonormal left ventricular filling pattern, with concomitant abnormal relaxation and increased filling pressure (grade 2 diastolic dysfunction).  ------------------------------------------------------------ Aortic valve: Trileaflet; mildly calcified leaflets. Doppler: There was no stenosis. No regurgitation.  ------------------------------------------------------------ Aorta: Normal aortic root size. Ascending aortic aneurysm reaching 4.8  cm.  ------------------------------------------------------------ Mitral valve: Doppler: There was no evidence for stenosis. Mild regurgitation.  ------------------------------------------------------------ Left atrium: The atrium was moderately dilated.  ------------------------------------------------------------ Right ventricle: The cavity size was normal. Pacer wire or catheter noted in right ventricle. Systolic function was normal.  ------------------------------------------------------------ Pulmonic valve: Structurally normal valve. Cusp separation was normal. Doppler: Transvalvular velocity was within the normal range. No regurgitation.  ------------------------------------------------------------ Tricuspid valve: Doppler: Trivial regurgitation.  ------------------------------------------------------------ Pulmonary artery: PA systolic pressure 2702-63mHg.  ------------------------------------------------------------ Right atrium: The atrium was mildly dilated.  ------------------------------------------------------------ Pericardium: A trivial pericardial effusion was identified.  ------------------------------------------------------------ Systemic veins: IVC measured 2.0 cm with normal respirophasic variation, suggesting RA pressure 6-10 mmHg.  ------------------------------------------------------------ Post procedure conclusions Ascending Aorta:  - Normal aortic root size. Ascending aortic aneurysm reaching 4.8 cm.  ------------------------------------------------------------  2D measurements Normal Doppler measurements Norma Left ventricle l LVID ED, 44.3 mm 43-52 Left ventricle chord, Ea, lat 8.6 cm/s ----- PLAX ann, tiss 6 LVID ES, 30.9 mm 23-38 DP chord, E/Ea, lat 6.6 ----- PLAX ann, tiss 7 FS, chord, 30 % >29 DP PLAX Ea, med 4.9 cm/s ----- LVPW, ED 8.92 mm ------ ann, tiss 3 IVS/LVPW 0.88 <1.3 DP ratio, ED E/Ea, med 11. ----- Ventricular  septum ann, tiss 72 IVS, ED 7.85 mm ------ DP LVOT LVOT Diam, S  21 mm ------ Peak vel, 103 cm/s ----- Area 3.46 cm^2 ------ S Diam 21 mm ------ VTI, S 25. cm ----- Aorta 8 Root diam, 32 mm ------ HR 60 bpm ----- ED Stroke 89. ml ----- Left atrium vol 4 AP dim 2.23 cm/m^2 <2.2 Cardiac 5.4 L/min ----- index output Cardiac 2.9 L/(min-m ----- index ^2) Stroke 47. ml/m^2 ----- index 5 Mitral valve Peak E 57. cm/s ----- vel 8 Peak A 54. cm/s ----- vel 8 Decelerat 391 ms 150-2 ion time 30 Peak E/A 1.1 ----- ratio Tricuspid valve Peak 19 mm Hg ----- RV-RA gradient, S  ------------------------------------------------------------ Prepared and Electronically Authenticated by  Loralie Champagne, MD 2013-01-23T20:42:[id number]   Followup CT scan of the chest done July 2013 Ct Angio Chest W/cm &/or Wo Cm  01/28/2012  *RADIOLOGY REPORT*  Clinical Data: Follow-up aortic aneurysm.  CT ANGIOGRAPHY CHEST  Technique:  Multidetector CT imaging of the chest using the standard protocol during bolus administration of intravenous contrast. Multiplanar reconstructed images including MIPs were obtained and reviewed to evaluate the vascular anatomy.  Contrast: 153m OMNIPAQUE IOHEXOL 300 MG/ML  SOLN  Comparison: 08/13/2011  Findings: Again noted is the mild aneurysmal dilatation of the ascending aorta, currently measuring maximally 4.7 cm compared 5.0 cm previously.  No dissection or significant change.  Proximal descending thoracic aorta measures maximally 3.1 cm.  Descending thoracic aorta at the aortic hiatus measures 2.8 cm.  Distal thoracic aortic calcifications noted.  Left-sided pacer remains in place, unchanged.  Coronary artery calcifications present. No mediastinal, hilar, or axillary adenopathy.  Linear scarring in the lung bases.  Lungs otherwise clear.  No pleural effusions.  No suspicious pulmonary nodules or masses.  Visualized chest wall soft tissues are unremarkable.  Imaging into the upper  abdomen demonstrates no acute findings.  No acute bony abnormality.  Mild degenerative changes and kyphosis in the thoracic spine.  IMPRESSION: Stable ascending aortic aneurysm, measuring maximally 4.7 cm.  Coronary artery disease.  Original Report Authenticated By: KRaelyn Number M.D.    Recent Lab Findings: Lab Results  Component Value Date   WBC 8.7 04/14/2012   HGB 13.3 04/14/2012   HCT 41.1 04/14/2012   PLT 278.0 04/14/2012   GLUCOSE 99 07/29/2012   ALT 25 07/29/2012   AST 24 07/29/2012   NA 141 07/29/2012   K 4.1 07/29/2012   CL 107 07/29/2012   CREATININE 0.90 09/14/2012   BUN 20 09/14/2012   CO2 30 07/29/2012   TSH 1.64 07/29/2012   INR 4.0 10/05/2012      Assessment / Plan:   Aortic Size Index=      4.9   /Body surface area is 1.98 meters squared. = 2.47 index         Ascending/decending= 1.6  < 2.75 cm/m2      4% risk per year 2.75 to 4.25          8% risk per year > 4.25 cm/m2    20% risk per year   I have reviewed the patient'sCT scan, she does have a dilated ascending aorta approximately 4.9 cm maximum diameter without evidence of aortic valve disease. There has been no change over a 8 month interval. At this point with her age of 89years size less then 5.5 cm and index 2.47  I would not recommend elective repair of her ascending aorta as her yearly risk from aortic  rupture is 4%. I've discussed with her the risks of dissection and consequences of that versus the risk  of elective repair. She understands a good blood pressure control including beta blockers important. We'll plan to see her back in approximately 12 months with a followup CTA of the aorta to evaluate any change in size.    Grace Isaac MD  Beeper [phone] Office [phone] 10/15/2012 5:56 PM

## 2012-10-19 ENCOUNTER — Ambulatory Visit (INDEPENDENT_AMBULATORY_CARE_PROVIDER_SITE_OTHER): Payer: Medicare Other | Admitting: *Deleted

## 2012-10-19 DIAGNOSIS — I4891 Unspecified atrial fibrillation: Secondary | ICD-10-CM

## 2012-10-19 DIAGNOSIS — Z7901 Long term (current) use of anticoagulants: Secondary | ICD-10-CM

## 2012-10-19 LAB — POCT INR: INR: 2.9

## 2012-11-09 ENCOUNTER — Ambulatory Visit (INDEPENDENT_AMBULATORY_CARE_PROVIDER_SITE_OTHER): Payer: Medicare Other

## 2012-11-09 DIAGNOSIS — Z7901 Long term (current) use of anticoagulants: Secondary | ICD-10-CM

## 2012-11-09 DIAGNOSIS — I4891 Unspecified atrial fibrillation: Secondary | ICD-10-CM

## 2012-11-20 ENCOUNTER — Ambulatory Visit (INDEPENDENT_AMBULATORY_CARE_PROVIDER_SITE_OTHER): Payer: Medicare Other | Admitting: Pharmacist

## 2012-11-20 ENCOUNTER — Other Ambulatory Visit (INDEPENDENT_AMBULATORY_CARE_PROVIDER_SITE_OTHER): Payer: Medicare Other

## 2012-11-20 DIAGNOSIS — I5032 Chronic diastolic (congestive) heart failure: Secondary | ICD-10-CM

## 2012-11-20 DIAGNOSIS — I4891 Unspecified atrial fibrillation: Secondary | ICD-10-CM

## 2012-11-20 DIAGNOSIS — I495 Sick sinus syndrome: Secondary | ICD-10-CM

## 2012-11-20 DIAGNOSIS — I10 Essential (primary) hypertension: Secondary | ICD-10-CM

## 2012-11-20 DIAGNOSIS — Z7901 Long term (current) use of anticoagulants: Secondary | ICD-10-CM

## 2012-11-20 LAB — HEPATIC FUNCTION PANEL
Albumin: 3.3 g/dL — ABNORMAL LOW (ref 3.5–5.2)
Alkaline Phosphatase: 67 U/L (ref 39–117)
Total Protein: 6.8 g/dL (ref 6.0–8.3)

## 2012-11-20 LAB — BASIC METABOLIC PANEL
CO2: 27 mEq/L (ref 19–32)
Calcium: 9.3 mg/dL (ref 8.4–10.5)
Creatinine, Ser: 1.1 mg/dL (ref 0.4–1.2)
GFR: 53.67 mL/min — ABNORMAL LOW (ref >60.00)
Glucose, Bld: 89 mg/dL (ref 70–99)
Sodium: 139 mEq/L (ref 135–145)

## 2012-11-20 LAB — POCT INR: INR: 2.8

## 2012-11-27 ENCOUNTER — Ambulatory Visit (INDEPENDENT_AMBULATORY_CARE_PROVIDER_SITE_OTHER): Payer: Medicare Other

## 2012-11-27 ENCOUNTER — Encounter: Payer: Self-pay | Admitting: Internal Medicine

## 2012-11-27 ENCOUNTER — Ambulatory Visit (INDEPENDENT_AMBULATORY_CARE_PROVIDER_SITE_OTHER): Payer: Medicare Other | Admitting: Internal Medicine

## 2012-11-27 VITALS — BP 142/74 | HR 79 | Ht 66.0 in | Wt 182.0 lb

## 2012-11-27 DIAGNOSIS — I4891 Unspecified atrial fibrillation: Secondary | ICD-10-CM

## 2012-11-27 DIAGNOSIS — Z7901 Long term (current) use of anticoagulants: Secondary | ICD-10-CM

## 2012-11-27 DIAGNOSIS — I495 Sick sinus syndrome: Secondary | ICD-10-CM

## 2012-11-27 LAB — POCT INR: INR: 3

## 2012-11-27 LAB — PACEMAKER DEVICE OBSERVATION
AL AMPLITUDE: 4 mv
AL IMPEDENCE PM: 423 Ohm
AL THRESHOLD: 0.75 V
ATRIAL PACING PM: 100
RV LEAD AMPLITUDE: 22.4 mv
RV LEAD IMPEDENCE PM: 800 Ohm
RV LEAD THRESHOLD: 0.75 V

## 2012-11-27 MED ORDER — AMIODARONE HCL 200 MG PO TABS: ORAL_TABLET | ORAL | Status: DC

## 2012-11-27 MED ORDER — FUROSEMIDE 20 MG PO TABS: ORAL_TABLET | ORAL | Status: DC

## 2012-11-27 NOTE — Patient Instructions (Addendum)
STOP HCTZ  Take your Lasix (furosemide) 79m every other day  Decrease your Amiodarone to 5 days a week  Please send a carelink transmission on 03/01/13  Your physician recommends that you schedule a follow-up appointment in: January 2015 with Dr. KCaryl Comes

## 2012-11-27 NOTE — Assessment & Plan Note (Signed)
The patient's device was interrogated.  The information was reviewed. No changes were made in the programming.    

## 2012-11-27 NOTE — Assessment & Plan Note (Signed)
She has chronic diastolic heart failure and a little bit volume overloaded. While her story of her edema isn't noted unusual in that it is largely over the forefoot of her foot as well as not particularly worse at the end of the day he did respond nicely to Lasix. The other part of the differential diagnosis would be lymphedema. Will anticipate increasing her diuresis by taking her HCTZ to Lasix 20 every other day. She'll continue on her potassium supplementation.  Part of her dyspnea may also be related to chronotropic competence. We spent a long time, greater than 20 minutes discussing the mechanism of rate response with her pacemaker i.e. Accelerometer. After these discussed and she elected not to have [place] try to "tweak" her rate response program

## 2012-11-27 NOTE — Progress Notes (Signed)
Patient Care Team: Arlyss Repress, MD as PCP - General (Internal Medicine) [name], MD (Cardiology)   HPI  [name] [name] is a 79 y.o. female Seen in followup for atrial fibrillation in the setting of modest nonobstructive coronary disease and normal left ventricular function. She also has a thoracic aortic aneurysm followed by Dr. Servando Snare She is status post pacemaker implantation for sinus node dysfunction and posttermination pauses. She's been treated with flecainide and subsequently with Rythmol and most recently amiodarone.  She saw Dr. Rayann Heman to consider catheter ablation but at this juncture would like to continue taking amiodarone. Liver function tests 5/14 with normal as was her TSH   She complains of problems with exercise tolerance. This is with stairs or walking too far. Notably she denies a cough.  She also complains of swelling of her feet and ankles. She is not sure whether this is worse at the beginning of the day or the end of the day but she does recall that one day she took her Lasix and this was associated with a marked improvement in her foot swelling.      Past Medical History  Diagnosis Date  . Persistent atrial fibrillation   . sinus node dysfunction//post termination pauses   . HTN (hypertension)   . CAD (coronary artery disease)     LHC 5/08:  pOM 20%, pRCA 20-30%, EF 60%  . NASH (nonalcoholic steatohepatitis)   . PMR (polymyalgia rheumatica)     no steriods for 5 years  . HLD (hyperlipidemia)   . Neuromuscular disorder   . Hepatitis     hx of medication induced hepatitis (Vytorin per pt)  . Ascending aorta enlargement     4.8 cm per echo 08/07/11; 5.0 by CT in Jan 2013  . Pacemaker-Medtronic 11/12/2011    Implanted 2013   . Hx of echocardiogram     echo 1/13: EF 55%, Gr 2 diast dysfn, Asc Ao aneurysm 4.8 cm, mild MR, mod LAE, mild RAE    Past Surgical History  Procedure Laterality Date  . Umbilical hernia repair  1950's  . Incision and  drainage breast abscess  1973  . Cardiac catheterization    . Hernia repair    . Pacemaker insertion  Jan 2013  . Cardioversion  03/02/2012    Procedure: CARDIOVERSION;  Surgeon: Lelon Perla, MD;  Location: [facility] ENDOSCOPY;  Service: Cardiovascular;  Laterality: N/A;  . Cardioversion  03/13/2012    Procedure: CARDIOVERSION;  Surgeon: Josue Hector, MD;  Location: [facility] ENDOSCOPY;  Service: Cardiovascular;  Laterality: N/A;  . Cardioversion  04/15/2012    Procedure: CARDIOVERSION;  Surgeon: [name], MD;  Location: [facility] ENDOSCOPY;  Service: Cardiovascular;  Laterality: N/A;    Current Outpatient Prescriptions  Medication Sig Dispense Refill  . amiodarone (PACERONE) 200 MG tablet Take 200 mg by mouth daily.      . Calcium Carbonate-Vitamin D (CALCIUM + D) 600-200 MG-UNIT TABS Take 1 tablet by mouth 2 (two) times daily.       . cetirizine (ZYRTEC) 10 MG tablet Take 10 mg by mouth as needed.       . Cholecalciferol (VITAMIN D3) 1000 UNITS tablet Take 1,000 Units by mouth daily.        . hydrochlorothiazide (HYDRODIURIL) 25 MG tablet Take 25 mg by mouth daily.      . multivitamin (THERAGRAN) per tablet Take 1 tablet by mouth daily.        . nadolol (CORGARD) 40 MG tablet  Take 80 mg by mouth every morning.      . potassium chloride SA (K-DUR,KLOR-CON) 20 MEQ tablet 2 tablets daily and 3 tablets every other day      . valsartan (DIOVAN) 320 MG tablet Take 1 tablet (320 mg total) by mouth daily.  30 tablet  6  . warfarin (COUMADIN) 2.5 MG tablet Take as directed by coumadin clinic  40 tablet  3  . WELCHOL 625 MG tablet Take 625 mg by mouth 2 (two) times daily with a meal.        No current facility-administered medications for this visit.    Allergies  Allergen Reactions  . Codeine     REACTION: feel funny  . Penicillins     REACTION: anaphylaxis  . Statins     REACTION: elevated LFT's  . Tylenol (Acetaminophen) Other (See Comments)    If taken with Vytorin at risk for liver damage     Review of Systems negative except from HPI and PMH  Physical Exam BP 142/74  Pulse 79  Ht 5' 6"  (1.676 m)  Wt 182 lb (82.555 kg)  BMI 29.39 kg/m2 Well developed and well nourished in no acute distress HENT normal E scleral and icterus clear Neck Supple JVP flat; carotids brisk and full Clear to ausculation  Regular rate and rhythm, no murmurs gallops or rub Soft with active bowel sounds No clubbing cyanosis Trace Edema Alert and oriented, grossly normal motor and sensory function Skin Warm and Dry  Electrocardiogram today demonstrates atrial pacing with intrinsic conduction to long AV interval Intervals 30/10/41 Axis is leftward at -15  Assessment and  Plan

## 2012-11-27 NOTE — Assessment & Plan Note (Signed)
She is chronotropic incompetence and 100% atrially paced.

## 2012-11-27 NOTE — Assessment & Plan Note (Signed)
Permanent. We will decrease her amiodarone dose from 7 days a week to 5 days a week. She is on anticoagulation. She feels terrific and sinus rhythm

## 2012-12-03 ENCOUNTER — Telehealth: Payer: Self-pay | Admitting: Internal Medicine

## 2012-12-03 NOTE — Telephone Encounter (Signed)
New problem     Recently seen in the office was ask by Dr. Caryl Comes if her feet are swollen patient did not notice at that time.     C/O both feet are swollen.

## 2012-12-03 NOTE — Telephone Encounter (Signed)
Spoke with pt, when she saw dr Caryl Comes he changed her HCTZ to furosemide every other day. She reports today is not a day for the lasix and she has a lot of swelling in her feet and legs. She is up one lb overnight. Told pt ok to take furosemide everyday for the next three days to get the edema under control. She will also elevate her legs when sitting and she will also try support hose. She is currently taking a potassium pill and will cont that. She will call back after three days if she cont to have issues with edema. She denies SOB or trouble lying flat to sleep.

## 2012-12-16 ENCOUNTER — Encounter: Payer: Self-pay | Admitting: Internal Medicine

## 2012-12-17 ENCOUNTER — Encounter: Payer: Self-pay | Admitting: Internal Medicine

## 2012-12-21 ENCOUNTER — Ambulatory Visit (INDEPENDENT_AMBULATORY_CARE_PROVIDER_SITE_OTHER): Payer: Medicare Other | Admitting: *Deleted

## 2012-12-21 DIAGNOSIS — Z7901 Long term (current) use of anticoagulants: Secondary | ICD-10-CM

## 2012-12-21 DIAGNOSIS — I4891 Unspecified atrial fibrillation: Secondary | ICD-10-CM

## 2013-01-04 ENCOUNTER — Ambulatory Visit (INDEPENDENT_AMBULATORY_CARE_PROVIDER_SITE_OTHER): Payer: Medicare Other | Admitting: *Deleted

## 2013-01-04 DIAGNOSIS — I4891 Unspecified atrial fibrillation: Secondary | ICD-10-CM

## 2013-01-04 DIAGNOSIS — Z7901 Long term (current) use of anticoagulants: Secondary | ICD-10-CM

## 2013-01-19 ENCOUNTER — Ambulatory Visit (INDEPENDENT_AMBULATORY_CARE_PROVIDER_SITE_OTHER): Payer: Medicare Other | Admitting: Pharmacist

## 2013-01-19 DIAGNOSIS — Z7901 Long term (current) use of anticoagulants: Secondary | ICD-10-CM

## 2013-01-19 DIAGNOSIS — I4891 Unspecified atrial fibrillation: Secondary | ICD-10-CM

## 2013-01-21 ENCOUNTER — Telehealth: Payer: Self-pay | Admitting: Internal Medicine

## 2013-01-21 MED ORDER — FUROSEMIDE 20 MG PO TABS: ORAL_TABLET | ORAL | Status: DC

## 2013-01-21 MED ORDER — HYDROCHLOROTHIAZIDE 25 MG PO TABS: 25.0000 mg | ORAL_TABLET | Freq: Every day | ORAL | Status: DC

## 2013-01-21 NOTE — Telephone Encounter (Signed)
Discussed with dr Caryl Comes, Spoke with pt, she will restart the HCTZ 25 mg once daily, it will be at controlling her bp. She will decrease the lasix to prn swelling or SOB. She will cont to track her bp and let us know of problems.

## 2013-01-21 NOTE — Telephone Encounter (Signed)
New Problem  Pt states that her BP has been running high. She said that the highest it has gotten is 176/101

## 2013-01-21 NOTE — Telephone Encounter (Signed)
Spoke with pt, last week she started having headaches, which is unusual for her, so she started tracking her bp. She is consistantly getting 150's-160's to 170's/ 77-101. Her meds were confirm. She denies edema and reports her episodes of SOB have lessened since starting the lasix. Will discuss with dr Caryl Comes

## 2013-01-25 ENCOUNTER — Telehealth: Payer: Self-pay | Admitting: Internal Medicine

## 2013-01-25 NOTE — Telephone Encounter (Signed)
Discussed with dr Caryl Comes, pt instructed to cont to monitor bp and call back on Friday if not better. Pt agreed with this plan.

## 2013-01-25 NOTE — Telephone Encounter (Signed)
New problem  Pt wants to speak with you regarding her BP. She said that she spoke with you on Thursday regarding it.

## 2013-01-25 NOTE — Telephone Encounter (Signed)
Spoke with pt, she restarted the HCTZ on Thursday. Her bp has gotten some better but this morning her bp before 155/99. Her meds were confirmed. Will discuss with dr Caryl Comes

## 2013-01-29 ENCOUNTER — Telehealth: Payer: Self-pay | Admitting: Internal Medicine

## 2013-01-29 DIAGNOSIS — I4891 Unspecified atrial fibrillation: Secondary | ICD-10-CM

## 2013-01-29 MED ORDER — AMIODARONE HCL 200 MG PO TABS: ORAL_TABLET | ORAL | Status: DC

## 2013-01-29 NOTE — Telephone Encounter (Signed)
Spoke with pt, she is still having some issues with her bp. It is ranging from 130/70 to 151/106. She also reports that since the decrease in the amiodarone she is having episodes of fast heart rate that causes chest discomfort and headache. Will discuss with dr Caryl Comes

## 2013-01-29 NOTE — Telephone Encounter (Signed)
Discussed with dr Caryl Comes, Spoke with pt, she was instructed to send a carelink transmission on Monday morning. She will also increase amiodarone to 2 tablets once daily x 2 weeks then decrease to one tablet daily. New script sent to pharm. Pt voiced understanding.

## 2013-01-29 NOTE — Telephone Encounter (Signed)
New problem  Pt would like to speak with you regarding her bp

## 2013-02-01 ENCOUNTER — Encounter: Payer: Self-pay | Admitting: Internal Medicine

## 2013-02-02 NOTE — Telephone Encounter (Signed)
Spoke with pt, aware the care-link transmission looked good. Pt reports her bp is still running high 150-160/80-90. Her resting heart rate is running 80's and 90's. Will make dr Caryl Comes aware.

## 2013-02-02 NOTE — Telephone Encounter (Signed)
Discussed with dr Caryl Comes, Damaris Schooner with pt, office visit scheduled for Thursday at 3:45 pm

## 2013-02-03 ENCOUNTER — Other Ambulatory Visit: Payer: Self-pay

## 2013-02-03 ENCOUNTER — Encounter: Payer: Self-pay | Admitting: *Deleted

## 2013-02-03 DIAGNOSIS — Z1231 Encounter for screening mammogram for malignant neoplasm of breast: Secondary | ICD-10-CM

## 2013-02-04 ENCOUNTER — Ambulatory Visit (INDEPENDENT_AMBULATORY_CARE_PROVIDER_SITE_OTHER): Payer: Medicare Other | Admitting: Internal Medicine

## 2013-02-04 ENCOUNTER — Encounter: Payer: Self-pay | Admitting: Internal Medicine

## 2013-02-04 VITALS — BP 160/81 | HR 70 | Ht 66.0 in | Wt 181.4 lb

## 2013-02-04 DIAGNOSIS — I10 Essential (primary) hypertension: Secondary | ICD-10-CM

## 2013-02-04 DIAGNOSIS — I5032 Chronic diastolic (congestive) heart failure: Secondary | ICD-10-CM

## 2013-02-04 DIAGNOSIS — I4891 Unspecified atrial fibrillation: Secondary | ICD-10-CM

## 2013-02-04 DIAGNOSIS — R079 Chest pain, unspecified: Secondary | ICD-10-CM

## 2013-02-04 DIAGNOSIS — Z950 Presence of cardiac pacemaker: Secondary | ICD-10-CM

## 2013-02-04 MED ORDER — NADOLOL 40 MG PO TABS: ORAL_TABLET | ORAL | Status: DC

## 2013-02-04 NOTE — Assessment & Plan Note (Signed)
She has had increasing problems with hypertension particularly in the mornings. The context of her ascending aortic aneurysm  a reasonable target may be 120-130. To that end, we will increase her nocturnal Corgard from 80--120

## 2013-02-04 NOTE — Assessment & Plan Note (Signed)
She has atypical chest pain. She had years ago minor nonobstructive coronary disease. She has known vascular disease. Given her pacemaker and her baseline ST segment changes we'll undertake Myoview scanning

## 2013-02-04 NOTE — Progress Notes (Signed)
Patient Care Team: Arlyss Repress, MD as PCP - General (Internal Medicine) [name], MD (Cardiology)   HPI  [name] [name] is a 79 y.o. female Seen because of concerns of blood pressure in the setting of ascending aortic aneurysm.  She also has had some episodic fullness in her chest that develops and received over seconds. It also extends up into her head she is left with a residual fatigue. There is no associated brackish taste.Catheterization 2008 demonstrated no obstructive disease; she has had no Myoview scanning  She's also had some palpitations; these however did not correlate with any identified arrhythmia in her device  Past Medical History  Diagnosis Date  . Persistent atrial fibrillation   . sinus node dysfunction//post termination pauses   . HTN (hypertension)   . CAD (coronary artery disease)     LHC 5/08:  pOM 20%, pRCA 20-30%, EF 60%  . NASH (nonalcoholic steatohepatitis)   . PMR (polymyalgia rheumatica)     no steriods for 5 years  . HLD (hyperlipidemia)   . Neuromuscular disorder   . Hepatitis     hx of medication induced hepatitis (Vytorin per pt)  . Ascending aorta enlargement     4.8 cm per echo 08/07/11; 5.0 by CT in Jan 2013  . Pacemaker-Medtronic 11/12/2011    Implanted 2013   . Hx of echocardiogram     echo 1/13: EF 55%, Gr 2 diast dysfn, Asc Ao aneurysm 4.8 cm, mild MR, mod LAE, mild RAE    Past Surgical History  Procedure Laterality Date  . Umbilical hernia repair  1950's  . Incision and drainage breast abscess  1973  . Cardiac catheterization    . Hernia repair    . Pacemaker insertion  Jan 2013  . Cardioversion  03/02/2012    Procedure: CARDIOVERSION;  Surgeon: Lelon Perla, MD;  Location: [facility] ENDOSCOPY;  Service: Cardiovascular;  Laterality: N/A;  . Cardioversion  03/13/2012    Procedure: CARDIOVERSION;  Surgeon: Josue Hector, MD;  Location: [facility] ENDOSCOPY;  Service: Cardiovascular;  Laterality: N/A;  . Cardioversion  04/15/2012     Procedure: CARDIOVERSION;  Surgeon: [name], MD;  Location: [facility] ENDOSCOPY;  Service: Cardiovascular;  Laterality: N/A;    Current Outpatient Prescriptions  Medication Sig Dispense Refill  . amiodarone (PACERONE) 200 MG tablet Take 200 mg by mouth daily. 2 tablets once daily.      . Calcium Carbonate-Vitamin D (CALCIUM + D) 600-200 MG-UNIT TABS Take 1 tablet by mouth 2 (two) times daily.       . cetirizine (ZYRTEC) 10 MG tablet Take 10 mg by mouth as needed.       . Cholecalciferol (VITAMIN D3) 1000 UNITS tablet Take 1,000 Units by mouth daily.        . hydrochlorothiazide (HYDRODIURIL) 25 MG tablet Take 1 tablet (25 mg total) by mouth daily.  90 tablet  3  . multivitamin (THERAGRAN) per tablet Take 1 tablet by mouth daily.        . nadolol (CORGARD) 40 MG tablet Take 80 mg by mouth every morning.      . potassium chloride SA (K-DUR,KLOR-CON) 20 MEQ tablet 2 tablets daily and 3 tablets every other day      . valsartan (DIOVAN) 320 MG tablet Take 1 tablet (320 mg total) by mouth daily.  30 tablet  6  . warfarin (COUMADIN) 2.5 MG tablet Take as directed by coumadin clinic  40 tablet  3  . WELCHOL 625 MG  tablet Take 625 mg by mouth 2 (two) times daily with a meal.        No current facility-administered medications for this visit.    Allergies  Allergen Reactions  . Codeine     REACTION: feel funny  . Penicillins     REACTION: anaphylaxis  . Statins     REACTION: elevated LFT's  . Tylenol (Acetaminophen) Other (See Comments)    If taken with Vytorin at risk for liver damage    Review of Systems negative except from HPI and PMH  Physical Exam BP 160/81  Pulse 70  Ht 5' 6"  (1.676 m)  Wt 181 lb 6.4 oz (82.283 kg)  BMI 29.29 kg/m2 Well developed and well nourished in no acute distress HENT normal E scleral and icterus clear Neck Supple JVP flat; carotids brisk and full Clear to ausculation regular rate and rhythm, no murmurs gallops or rub Soft with active bowel  sounds No clubbing cyanosis none Edema Alert and oriented, grossly normal motor and sensory function Skin Warm and Dry  ECG demonstrates atrial paced rhythm at 70 Interval 17/10/41 Diffuse ST segment depression of 1 mm. This is unchanged from 5/14  Assessment and  Plan

## 2013-02-04 NOTE — Assessment & Plan Note (Signed)
She is euvolemic.

## 2013-02-04 NOTE — Patient Instructions (Addendum)
Your physician has requested that you have a lexiscan myoview. For further information please visit [email or web address]. Please follow instruction sheet, as given.  Your physician has recommended you make the following change in your medication:  1) Increase nadolol (corgard) to 40 mg three tablets by mouth every evening  Remote monitoring is used to monitor your Pacemaker of ICD from home. This monitoring reduces the number of office visits required to check your device to one time per year. It allows [place] to keep an eye on the functioning of your device to ensure it is working properly. You are scheduled for a device check from home on 05/10/13. You may send your transmission at any time that day. If you have a wireless device, the transmission will be sent automatically. After your physician reviews your transmission, you will receive a postcard with your next transmission date.  Your physician wants you to follow-up in: January 2015 with Dr. Caryl Comes. You will receive a reminder letter in the mail two months in advance. If you don't receive a letter, please call our office to schedule the follow-up appointment.

## 2013-02-04 NOTE — Assessment & Plan Note (Addendum)
No intercurrent atrial fibrillation. We'll continue her on amiodarone 200 mg a day. Last surveillance labs were 5/14

## 2013-02-08 ENCOUNTER — Ambulatory Visit (INDEPENDENT_AMBULATORY_CARE_PROVIDER_SITE_OTHER): Payer: Medicare Other | Admitting: *Deleted

## 2013-02-08 DIAGNOSIS — I4891 Unspecified atrial fibrillation: Secondary | ICD-10-CM

## 2013-02-08 DIAGNOSIS — Z7901 Long term (current) use of anticoagulants: Secondary | ICD-10-CM

## 2013-02-08 LAB — POCT INR: INR: 2.7

## 2013-02-10 ENCOUNTER — Ambulatory Visit (HOSPITAL_COMMUNITY): Payer: Medicare Other | Attending: Cardiovascular Disease | Admitting: Radiology

## 2013-02-10 ENCOUNTER — Encounter: Payer: Self-pay | Admitting: Internal Medicine

## 2013-02-10 VITALS — BP 180/99 | Ht 66.0 in | Wt 181.0 lb

## 2013-02-10 DIAGNOSIS — I2510 Atherosclerotic heart disease of native coronary artery without angina pectoris: Secondary | ICD-10-CM | POA: Insufficient documentation

## 2013-02-10 DIAGNOSIS — R079 Chest pain, unspecified: Secondary | ICD-10-CM

## 2013-02-10 DIAGNOSIS — R5383 Other fatigue: Secondary | ICD-10-CM | POA: Insufficient documentation

## 2013-02-10 DIAGNOSIS — I714 Abdominal aortic aneurysm, without rupture, unspecified: Secondary | ICD-10-CM | POA: Insufficient documentation

## 2013-02-10 DIAGNOSIS — I739 Peripheral vascular disease, unspecified: Secondary | ICD-10-CM | POA: Insufficient documentation

## 2013-02-10 DIAGNOSIS — R0602 Shortness of breath: Secondary | ICD-10-CM

## 2013-02-10 DIAGNOSIS — R5381 Other malaise: Secondary | ICD-10-CM | POA: Insufficient documentation

## 2013-02-10 DIAGNOSIS — I4891 Unspecified atrial fibrillation: Secondary | ICD-10-CM

## 2013-02-10 DIAGNOSIS — I10 Essential (primary) hypertension: Secondary | ICD-10-CM | POA: Insufficient documentation

## 2013-02-10 DIAGNOSIS — R0789 Other chest pain: Secondary | ICD-10-CM

## 2013-02-10 DIAGNOSIS — J438 Other emphysema: Secondary | ICD-10-CM | POA: Insufficient documentation

## 2013-02-10 DIAGNOSIS — Z950 Presence of cardiac pacemaker: Secondary | ICD-10-CM | POA: Insufficient documentation

## 2013-02-10 MED ORDER — REGADENOSON 0.4 MG/5ML IV SOLN
0.4000 mg | Freq: Once | INTRAVENOUS | Status: AC
  Administered 2013-02-10: 0.4 mg via INTRAVENOUS

## 2013-02-10 MED ORDER — TECHNETIUM TC 99M SESTAMIBI GENERIC - CARDIOLITE
33.0000 | Freq: Once | INTRAVENOUS | Status: AC | PRN
  Administered 2013-02-10: 33 via INTRAVENOUS

## 2013-02-10 MED ORDER — AMINOPHYLLINE 25 MG/ML IV SOLN
150.0000 mg | Freq: Two times a day (BID) | INTRAVENOUS | Status: DC | PRN
  Administered 2013-02-10: 150 mg via INTRAVENOUS

## 2013-02-10 MED ORDER — TECHNETIUM TC 99M SESTAMIBI GENERIC - CARDIOLITE
11.0000 | Freq: Once | INTRAVENOUS | Status: AC | PRN
  Administered 2013-02-10: 11 via INTRAVENOUS

## 2013-02-10 NOTE — Progress Notes (Signed)
[facility] [address] [place], [place] [address] [phone]    Cardiology Nuclear Med Study  [name] [name] is a 79 y.o. female     MRN : [id number]     DOB: 08-06-1930  Procedure Date: 02/10/2013  Nuclear Med Background Indication for Stress Test:  Evaluation for Ischemia History:  Emphysema and AFIB, AAA 5.0,&#39;08 Heart Cath: N/O CAD EF: 60%, 11/29/08 GXT,  01/13 CT: AAA 5.a cm with calcification  2013: Cardioversion/Pacemaker 1/13 ECHO: EF: 55% mild MR, mod LAE  Cardiac Risk Factors: Hypertension, Lipids and PVD  Symptoms:  Chest Pressure and Fatigue   Nuclear Pre-Procedure Caffeine/Decaff Intake:  None> 12hrs NPO After: 7:00pm   Lungs:  clear O2 Sat: 97% on room air. IV 0.9% NS with Angio Cath:  20g  IV Site: R Antecubital x 1, tolerated well IV Started by:  Irven Baltimore, RN  Chest Size (in):  38 Cup Size: C  Height: 5' 6"  (1.676 m)  Weight:  181 lb (82.101 kg)  BMI:  Body mass index is 29.23 kg/(m^2). Tech Comments:  Took Amiodarone, Diovan this am, and Corgard last night. This patient received 150 mg for extreme symptoms with the Lexiscan injection. All symptoms were resolved after the Aminophylline injection.    Nuclear Med Study 1 or 2 day study: 1 day  Stress Test Type:  Carlton Adam  Reading MD: Jenkins Rouge, MD  Order Authorizing Provider:  Virl Axe, MD  Resting Radionuclide: Technetium 44mSestamibi  Resting Radionuclide Dose: 11.0 mCi   Stress Radionuclide:  Technetium 918mestamibi  Stress Radionuclide Dose: 33.0 mCi           Stress Protocol Rest HR: 60 Stress HR: 61  Rest BP: 180/99 Stress BP: 200/94  Exercise Time (min): n/a METS: n/a   Predicted Max HR: 141 bpm % Max HR: 43.26 bpm Rate Pressure Product: 12200   Dose of Adenosine (mg):  n/a Dose of Lexiscan: 0.4 mg  Dose of Atropine (mg): n/a Dose of Dobutamine: n/a mcg/kg/min (at max HR)  Stress Test Technologist: SaPerrin MalteseEMT-P  Nuclear Technologist:   StCharlton AmorCNMT     Rest Procedure:  Myocardial perfusion imaging was performed at rest 45 minutes following the intravenous administration of Technetium 9974mstamibi. Rest ECG: NSR LVH and lateral ST segment chagnes  Stress Procedure:  The patient received IV Lexiscan 0.4 mg over 15-seconds.  Technetium 11m30mtamibi injected at 30-seconds. The patient was sob, Lt.headed, nausea, and headache. Quantitative spect images were obtained after a 45 minute delay. Stress ECG: No significant change from baseline ECG  QPS Raw Data Images:  Patient motion noted. Stress Images:  Normal homogeneous uptake in all areas of the myocardium. Rest Images:  Normal homogeneous uptake in all areas of the myocardium. Subtraction (SDS):  No evidence of ischemia. Transient Ischemic Dilatation (Normal <1.22):  n/a Lung/Heart Ratio (Normal <0.45):  0.39  Quantitative Gated Spect Images QGS EDV:  63 ml QGS ESV:  22 ml  Impression Exercise Capacity:  Lexiscan with no exercise. BP Response:  Normal BP response Clinical Symptoms:  There is dyspnea. ECG Impression:  No significant ST segment change suggestive of ischemia. Comparison with Prior Nuclear Study: No images to compare  Overall Impression:  Normal stress nuclear study.  LV Ejection Fraction: 64%.  LV Wall Motion:  NL LV Function; NL Wall Motion  PeteJenkins Rouge

## 2013-02-16 ENCOUNTER — Telehealth: Payer: Self-pay | Admitting: Internal Medicine

## 2013-02-16 NOTE — Telephone Encounter (Signed)
Follow Up    Pt following up on stress test results. Please call.

## 2013-02-16 NOTE — Telephone Encounter (Signed)
I spoke with the patient gave her prelim results on her myoview. I made her aware Dr. Caryl Comes has not signed off. I will call her back should her make any recommendations for her. She is agreeable.

## 2013-02-16 NOTE — Telephone Encounter (Signed)
I left a message for the patient to call. 

## 2013-02-22 ENCOUNTER — Ambulatory Visit
Admission: RE | Admit: 2013-02-22 | Discharge: 2013-02-22 | Disposition: A | Discharge status: Home / Self Care | Payer: Medicare Other | Source: Ambulatory Visit

## 2013-02-22 DIAGNOSIS — Z1231 Encounter for screening mammogram for malignant neoplasm of breast: Secondary | ICD-10-CM

## 2013-03-08 ENCOUNTER — Ambulatory Visit (INDEPENDENT_AMBULATORY_CARE_PROVIDER_SITE_OTHER): Payer: Medicare Other | Admitting: *Deleted

## 2013-03-08 DIAGNOSIS — I4891 Unspecified atrial fibrillation: Secondary | ICD-10-CM

## 2013-03-08 DIAGNOSIS — Z7901 Long term (current) use of anticoagulants: Secondary | ICD-10-CM

## 2013-03-09 ENCOUNTER — Other Ambulatory Visit: Payer: Self-pay | Admitting: *Deleted

## 2013-03-09 DIAGNOSIS — I4891 Unspecified atrial fibrillation: Secondary | ICD-10-CM

## 2013-03-09 MED ORDER — VALSARTAN 320 MG PO TABS: 320.0000 mg | ORAL_TABLET | Freq: Every day | ORAL | Status: DC

## 2013-03-29 ENCOUNTER — Other Ambulatory Visit: Payer: Self-pay | Admitting: *Deleted

## 2013-03-29 MED ORDER — WARFARIN SODIUM 2.5 MG PO TABS: ORAL_TABLET | ORAL | Status: DC

## 2013-04-02 ENCOUNTER — Other Ambulatory Visit: Payer: Self-pay | Admitting: Registered Nurse

## 2013-04-02 ENCOUNTER — Other Ambulatory Visit: Payer: Self-pay | Admitting: Internal Medicine

## 2013-04-02 ENCOUNTER — Other Ambulatory Visit (HOSPITAL_COMMUNITY)
Admission: RE | Admit: 2013-04-02 | Discharge: 2013-04-02 | Disposition: A | Discharge status: Home / Self Care | Payer: Medicare Other | Source: Ambulatory Visit | Attending: Internal Medicine | Admitting: Internal Medicine

## 2013-04-02 DIAGNOSIS — N950 Postmenopausal bleeding: Secondary | ICD-10-CM

## 2013-04-02 DIAGNOSIS — Z8041 Family history of malignant neoplasm of ovary: Secondary | ICD-10-CM

## 2013-04-02 DIAGNOSIS — Z124 Encounter for screening for malignant neoplasm of cervix: Secondary | ICD-10-CM | POA: Insufficient documentation

## 2013-04-05 ENCOUNTER — Ambulatory Visit (INDEPENDENT_AMBULATORY_CARE_PROVIDER_SITE_OTHER): Payer: Medicare Other | Admitting: *Deleted

## 2013-04-05 DIAGNOSIS — I4891 Unspecified atrial fibrillation: Secondary | ICD-10-CM

## 2013-04-05 DIAGNOSIS — Z7901 Long term (current) use of anticoagulants: Secondary | ICD-10-CM

## 2013-04-06 ENCOUNTER — Ambulatory Visit
Admission: RE | Admit: 2013-04-06 | Discharge: 2013-04-06 | Disposition: A | Discharge status: Home / Self Care | Payer: Medicare Other | Source: Ambulatory Visit | Attending: Internal Medicine | Admitting: Internal Medicine

## 2013-04-06 DIAGNOSIS — N950 Postmenopausal bleeding: Secondary | ICD-10-CM

## 2013-04-06 DIAGNOSIS — Z8041 Family history of malignant neoplasm of ovary: Secondary | ICD-10-CM

## 2013-04-19 ENCOUNTER — Other Ambulatory Visit: Payer: Self-pay | Admitting: Obstetrics and Gynecology

## 2013-04-23 ENCOUNTER — Ambulatory Visit (INDEPENDENT_AMBULATORY_CARE_PROVIDER_SITE_OTHER): Payer: Medicare Other | Admitting: *Deleted

## 2013-04-23 DIAGNOSIS — I4891 Unspecified atrial fibrillation: Secondary | ICD-10-CM

## 2013-04-23 DIAGNOSIS — Z7901 Long term (current) use of anticoagulants: Secondary | ICD-10-CM

## 2013-04-23 LAB — POCT INR: INR: 2.4

## 2013-05-10 ENCOUNTER — Ambulatory Visit (INDEPENDENT_AMBULATORY_CARE_PROVIDER_SITE_OTHER): Payer: Medicare Other | Admitting: *Deleted

## 2013-05-10 ENCOUNTER — Encounter: Payer: Self-pay | Admitting: Internal Medicine

## 2013-05-10 DIAGNOSIS — I495 Sick sinus syndrome: Secondary | ICD-10-CM

## 2013-05-10 DIAGNOSIS — Z950 Presence of cardiac pacemaker: Secondary | ICD-10-CM

## 2013-05-14 LAB — REMOTE PACEMAKER DEVICE
AL THRESHOLD: 0.625 V
BAMS-0001: 150 {beats}/min
BATTERY VOLTAGE: 2.79 V
RV LEAD AMPLITUDE: 22.4 mv
RV LEAD THRESHOLD: 0.625 V
VENTRICULAR PACING PM: 1

## 2013-05-21 ENCOUNTER — Ambulatory Visit (INDEPENDENT_AMBULATORY_CARE_PROVIDER_SITE_OTHER): Payer: Medicare Other | Admitting: *Deleted

## 2013-05-21 ENCOUNTER — Encounter: Payer: Self-pay | Admitting: *Deleted

## 2013-05-21 DIAGNOSIS — I4891 Unspecified atrial fibrillation: Secondary | ICD-10-CM

## 2013-05-21 DIAGNOSIS — Z23 Encounter for immunization: Secondary | ICD-10-CM

## 2013-05-21 DIAGNOSIS — Z7901 Long term (current) use of anticoagulants: Secondary | ICD-10-CM

## 2013-05-21 LAB — POCT INR: INR: 3.1

## 2013-06-08 ENCOUNTER — Other Ambulatory Visit: Payer: Self-pay

## 2013-06-18 ENCOUNTER — Other Ambulatory Visit: Payer: Self-pay

## 2013-06-18 MED ORDER — POTASSIUM CHLORIDE CRYS ER 20 MEQ PO TBCR: EXTENDED_RELEASE_TABLET | ORAL | Status: DC

## 2013-06-21 ENCOUNTER — Ambulatory Visit (INDEPENDENT_AMBULATORY_CARE_PROVIDER_SITE_OTHER): Payer: Medicare Other | Admitting: *Deleted

## 2013-06-21 DIAGNOSIS — Z7901 Long term (current) use of anticoagulants: Secondary | ICD-10-CM

## 2013-06-21 DIAGNOSIS — I4891 Unspecified atrial fibrillation: Secondary | ICD-10-CM

## 2013-07-19 ENCOUNTER — Ambulatory Visit (INDEPENDENT_AMBULATORY_CARE_PROVIDER_SITE_OTHER): Payer: Medicare Other | Admitting: *Deleted

## 2013-07-19 DIAGNOSIS — I4891 Unspecified atrial fibrillation: Secondary | ICD-10-CM

## 2013-07-19 DIAGNOSIS — Z7901 Long term (current) use of anticoagulants: Secondary | ICD-10-CM

## 2013-07-19 LAB — POCT INR: INR: 2.1

## 2013-07-26 ENCOUNTER — Ambulatory Visit (INDEPENDENT_AMBULATORY_CARE_PROVIDER_SITE_OTHER): Payer: Medicare Other | Admitting: Internal Medicine

## 2013-07-26 ENCOUNTER — Encounter: Payer: Self-pay | Admitting: Internal Medicine

## 2013-07-26 VITALS — BP 148/86 | HR 86 | Ht 66.5 in | Wt 185.1 lb

## 2013-07-26 DIAGNOSIS — R0609 Other forms of dyspnea: Secondary | ICD-10-CM

## 2013-07-26 DIAGNOSIS — I10 Essential (primary) hypertension: Secondary | ICD-10-CM

## 2013-07-26 DIAGNOSIS — R0989 Other specified symptoms and signs involving the circulatory and respiratory systems: Secondary | ICD-10-CM

## 2013-07-26 DIAGNOSIS — I495 Sick sinus syndrome: Secondary | ICD-10-CM

## 2013-07-26 DIAGNOSIS — I4891 Unspecified atrial fibrillation: Secondary | ICD-10-CM

## 2013-07-26 DIAGNOSIS — R42 Dizziness and giddiness: Secondary | ICD-10-CM

## 2013-07-26 DIAGNOSIS — I7789 Other specified disorders of arteries and arterioles: Secondary | ICD-10-CM

## 2013-07-26 DIAGNOSIS — Z950 Presence of cardiac pacemaker: Secondary | ICD-10-CM

## 2013-07-26 LAB — MDC_IDC_ENUM_SESS_TYPE_INCLINIC
Battery Impedance: 138 Ohm
Battery Remaining Longevity: 129 mo
Battery Voltage: 2.79 V
Brady Statistic AP VP Percent: 2 %
Brady Statistic AP VS Percent: 98 %
Lead Channel Pacing Threshold Amplitude: 0.75 V
Lead Channel Pacing Threshold Pulse Width: 0.4 ms
Lead Channel Sensing Intrinsic Amplitude: 22.4 mV
Lead Channel Sensing Intrinsic Amplitude: 4 mV
Lead Channel Setting Pacing Amplitude: 2 V
Lead Channel Setting Pacing Amplitude: 2.5 V
Lead Channel Setting Pacing Pulse Width: 0.4 ms
MDC IDC MSMT LEADCHNL RA IMPEDANCE VALUE: 417 Ohm
MDC IDC MSMT LEADCHNL RA PACING THRESHOLD AMPLITUDE: 0.75 V
MDC IDC MSMT LEADCHNL RA PACING THRESHOLD PULSEWIDTH: 0.4 ms
MDC IDC MSMT LEADCHNL RV IMPEDANCE VALUE: 817 Ohm
MDC IDC SESS DTM: 20150112094502
MDC IDC SET LEADCHNL RV SENSING SENSITIVITY: 5.6 mV
MDC IDC STAT BRADY AS VP PERCENT: 0 %
MDC IDC STAT BRADY AS VS PERCENT: 0 %

## 2013-07-26 LAB — HEPATIC FUNCTION PANEL
ALK PHOS: 58 U/L (ref 39–117)
ALT: 25 U/L (ref 0–35)
AST: 27 U/L (ref 0–37)
Albumin: 3.5 g/dL (ref 3.5–5.2)
BILIRUBIN DIRECT: 0.1 mg/dL (ref 0.0–0.3)
BILIRUBIN TOTAL: 0.7 mg/dL (ref 0.3–1.2)
Total Protein: 7.1 g/dL (ref 6.0–8.3)

## 2013-07-26 LAB — TSH: TSH: 0.78 u[IU]/mL (ref 0.35–5.50)

## 2013-07-26 MED ORDER — AMIODARONE HCL 200 MG PO TABS: ORAL_TABLET | ORAL | Status: DC

## 2013-07-26 NOTE — Assessment & Plan Note (Signed)
Stable follwoed by Dr Gerilyn Pilgrim

## 2013-07-26 NOTE — Assessment & Plan Note (Addendum)
I suspect is related to have test. It may be aggravated by her heart rate excursion on her pacemaker. We'll undertake an in office exercise test.  It turned out that she did better with her rate parameters programmed at 2 instead of 3 urea

## 2013-07-26 NOTE — Patient Instructions (Signed)
Your physician has recommended you make the following change in your medication:  1) Change how you take your Amiodarone - only take it 5 days a week  Your physician recommends that you return for lab work today: TSH/LFT  Remote monitoring is used to monitor your Pacemaker of ICD from home. This monitoring reduces the number of office visits required to check your device to one time per year. It allows [place] to keep an eye on the functioning of your device to ensure it is working properly. You are scheduled for a device check from home on 10/27/13. You may send your transmission at any time that day. If you have a wireless device, the transmission will be sent automatically. After your physician reviews your transmission, you will receive a postcard with your next transmission date.  Your physician wants you to follow-up in: 6 months with Ileene Hutchinson, PAC.  You will receive a reminder letter in the mail two months in advance. If you don't receive a letter, please call our office to schedule the follow-up appointment.

## 2013-07-26 NOTE — Progress Notes (Signed)
Patient Care Team: [name], MD as PCP - General (Internal Medicine) [name], MD (Cardiology)   HPI  [name] [name] is a 80 y.o. female  Seen in followup for atrial fibrillation in the setting of modest nonobstructive coronary disease and normal left ventricular function. She also has a thoracic aortic aneurysm followed by Dr. Servando Snare She is status post pacemaker implantation for sinus node dysfunction and posttermination pauses. She's been treated with flecainide and subsequently with Rythmol and most recently amiodarone.    She saw Dr. Rayann Heman to consider catheter ablation but at this juncture would like to continue taking amiodarone      When she was last seen in May 2014 the issue of edema with a differential of lymphedema as was in the dorsum of her foot. Her diuretics were changed from HCTZ--Lasix because of inadequate blood pressure control HCTZ was resumed.  Myoview scanning for atypical chest pain and dyspnea on exertion was undertaken 7/14 and was normal  She has DOE and feels like she cant do what she wouldn like to do   Past Medical History  Diagnosis Date  . Persistent atrial fibrillation   . sinus node dysfunction//post termination pauses   . HTN (hypertension)   . CAD (coronary artery disease)     LHC 5/08:  pOM 20%, pRCA 20-30%, EF 60%  . NASH (nonalcoholic steatohepatitis)   . PMR (polymyalgia rheumatica)     no steriods for 5 years  . HLD (hyperlipidemia)   . Neuromuscular disorder   . Hepatitis     hx of medication induced hepatitis (Vytorin per pt)  . Ascending aorta enlargement     4.8 cm per echo 08/07/11; 5.0 by CT in Jan 2013  . Pacemaker-Medtronic 11/12/2011    Implanted 2013   . Hx of echocardiogram     echo 1/13: EF 55%, Gr 2 diast dysfn, Asc Ao aneurysm 4.8 cm, mild MR, mod LAE, mild RAE    Past Surgical History  Procedure Laterality Date  . Umbilical hernia repair  1950's  . Incision and drainage breast abscess  1973  .  Cardiac catheterization    . Hernia repair    . Pacemaker insertion  Jan 2013  . Cardioversion  03/02/2012    Procedure: CARDIOVERSION;  Surgeon: Lelon Perla, MD;  Location: [facility] ENDOSCOPY;  Service: Cardiovascular;  Laterality: N/A;  . Cardioversion  03/13/2012    Procedure: CARDIOVERSION;  Surgeon: Josue Hector, MD;  Location: [facility] ENDOSCOPY;  Service: Cardiovascular;  Laterality: N/A;  . Cardioversion  04/15/2012    Procedure: CARDIOVERSION;  Surgeon: [name], MD;  Location: [facility] ENDOSCOPY;  Service: Cardiovascular;  Laterality: N/A;    Current Outpatient Prescriptions  Medication Sig Dispense Refill  . amiodarone (PACERONE) 200 MG tablet Take 200 mg by mouth daily. 2 tablets once daily.      . Calcium Carbonate-Vitamin D (CALCIUM + D) 600-200 MG-UNIT TABS Take 1 tablet by mouth 2 (two) times daily.       . cetirizine (ZYRTEC) 10 MG tablet Take 10 mg by mouth as needed.       . Cholecalciferol (VITAMIN D3) 1000 UNITS tablet Take 1,000 Units by mouth daily.        . hydrochlorothiazide (HYDRODIURIL) 25 MG tablet Take 1 tablet (25 mg total) by mouth daily.  90 tablet  3  . multivitamin (THERAGRAN) per tablet Take 1 tablet by mouth daily.        . nadolol (CORGARD)  40 MG tablet Take three tablets by mouth every evening  90 tablet  11  . potassium chloride SA (K-DUR,KLOR-CON) 20 MEQ tablet 2 tablets daily and 3 tablets every other day  75 tablet  6  . valsartan (DIOVAN) 320 MG tablet Take 1 tablet (320 mg total) by mouth daily.  30 tablet  11  . warfarin (COUMADIN) 2.5 MG tablet Take as directed by coumadin clinic  40 tablet  3  . WELCHOL 625 MG tablet Take 625 mg by mouth 2 (two) times daily with a meal.        No current facility-administered medications for this visit.    Allergies  Allergen Reactions  . Penicillins     REACTION: anaphylaxis  . Codeine     REACTION: feel funny  . Statins     REACTION: elevated LFT's  . Tylenol [Acetaminophen] Other (See Comments)    If  taken with Vytorin at risk for liver damage    Review of Systems negative except from HPI and PMH  Physical Exam BP 148/86  Pulse 86  Ht 5' 6.5" (1.689 m)  Wt 185 lb 1.9 oz (83.97 kg)  BMI 29.44 kg/m2 Well developed and nourished in no acute distress HENT normal Neck supple with JVP-flat Clear Regular rate and rhythm, no murmurs or gallops Abd-soft with active BS No Clubbing cyanosis tr edema Skin-warm and dry A & Oriented  Grossly normal sensory and motor function ry    Assessment and  Plan

## 2013-07-26 NOTE — Assessment & Plan Note (Signed)
Is significant atrial fibrillation. We'll continue her currently on amiodarone at 200 mg daily. We will decrease amiodarone to 200 mg 5 days a week. We'll check surveillance laboratories. Continue anticoagulation

## 2013-07-26 NOTE — Assessment & Plan Note (Signed)
imprved

## 2013-07-26 NOTE — Assessment & Plan Note (Signed)
.  sfy The patient's device was interrogated and the information was fully reviewed.  The device was reprogrammed as above

## 2013-07-26 NOTE — Assessment & Plan Note (Signed)
Not excessive

## 2013-07-29 ENCOUNTER — Encounter: Payer: Self-pay | Admitting: Internal Medicine

## 2013-08-10 ENCOUNTER — Telehealth: Payer: Self-pay | Admitting: Internal Medicine

## 2013-08-10 NOTE — Telephone Encounter (Signed)
New message  Patient would like to know her test results. Please call and advise.

## 2013-08-11 NOTE — Telephone Encounter (Signed)
A user error has taken place: encounter opened in error, closed for administrative reasons.    Pt informed and documented in lab results

## 2013-08-16 ENCOUNTER — Ambulatory Visit (INDEPENDENT_AMBULATORY_CARE_PROVIDER_SITE_OTHER): Payer: Medicare Other

## 2013-08-16 DIAGNOSIS — Z7901 Long term (current) use of anticoagulants: Secondary | ICD-10-CM

## 2013-08-16 DIAGNOSIS — I4891 Unspecified atrial fibrillation: Secondary | ICD-10-CM

## 2013-08-16 LAB — POCT INR: INR: 2.5

## 2013-08-29 ENCOUNTER — Encounter (HOSPITAL_COMMUNITY): Payer: Self-pay | Admitting: Emergency Medicine

## 2013-08-29 ENCOUNTER — Emergency Department (HOSPITAL_COMMUNITY)
Admission: EM | Admit: 2013-08-29 | Discharge: 2013-08-29 | Disposition: A | Discharge status: Home / Self Care | Payer: Medicare Other | Source: Home / Self Care

## 2013-08-29 DIAGNOSIS — R05 Cough: Secondary | ICD-10-CM

## 2013-08-29 DIAGNOSIS — J988 Other specified respiratory disorders: Secondary | ICD-10-CM

## 2013-08-29 DIAGNOSIS — R059 Cough, unspecified: Secondary | ICD-10-CM

## 2013-08-29 MED ORDER — LEVOFLOXACIN 250 MG PO TABS: 250.0000 mg | ORAL_TABLET | Freq: Every day | ORAL | Status: DC

## 2013-08-29 MED ORDER — METHYLPREDNISOLONE ACETATE 80 MG/ML IJ SUSP
80.0000 mg | Freq: Once | INTRAMUSCULAR | Status: AC
  Administered 2013-08-29: 80 mg via INTRAMUSCULAR

## 2013-08-29 MED ORDER — METHYLPREDNISOLONE ACETATE 80 MG/ML IJ SUSP
INTRAMUSCULAR | Status: AC
  Filled 2013-08-29: qty 1

## 2013-08-29 NOTE — ED Notes (Signed)
Pt  Reports    Symptoms  Of  Body  Aches           Coughing        sorethroat          sorethroat    The  Cough has been  Lingering and  Has  Been persistant for  sev  Weeks       The  Pt is  awwake  And  Alert  And  Oriented       Speaking in  Complete  sentances  And  Is in no  Acute  distress

## 2013-08-29 NOTE — Discharge Instructions (Signed)
Cough, Adult  A cough is a reflex that helps clear your throat and airways. It can help heal the body or may be a reaction to an irritated airway. A cough may only last 2 or 3 weeks (acute) or may last more than 8 weeks (chronic).  CAUSES Acute cough:  Viral or bacterial infections. Chronic cough:  Infections.  Allergies.  Asthma.  Post-nasal drip.  Smoking.  Heartburn or acid reflux.  Some medicines.  Chronic lung problems (COPD).  Cancer. SYMPTOMS   Cough.  Fever.  Chest pain.  Increased breathing rate.  High-pitched whistling sound when breathing (wheezing).  Colored mucus that you cough up (sputum). TREATMENT   A bacterial cough may be treated with antibiotic medicine.  A viral cough must run its course and will not respond to antibiotics.  Your caregiver may recommend other treatments if you have a chronic cough. HOME CARE INSTRUCTIONS   Only take over-the-counter or prescription medicines for pain, discomfort, or fever as directed by your caregiver. Use cough suppressants only as directed by your caregiver.  Use a cold steam vaporizer or humidifier in your bedroom or home to help loosen secretions.  Sleep in a semi-upright position if your cough is worse at night.  Rest as needed.  Stop smoking if you smoke. SEEK IMMEDIATE MEDICAL CARE IF:   You have pus in your sputum.  Your cough starts to worsen.  You cannot control your cough with suppressants and are losing sleep.  You begin coughing up blood.  You have difficulty breathing.  You develop pain which is getting worse or is uncontrolled with medicine.  You have a fever. MAKE SURE YOU:   Understand these instructions.  Will watch your condition.  Will get help right away if you are not doing well or get worse. Document Released: 12/28/2010 Document Revised: 09/23/2011 Document Reviewed: 12/28/2010 [facility] Patient Information 2014 [place].   Use Delsym OTC as directed  for cough. Complete ABX and f/u with PCP if no improvement.

## 2013-08-29 NOTE — ED Provider Notes (Signed)
CSN: [id number]     Arrival date & time 08/29/13  1511 History   None    Chief Complaint  Patient presents with  . URI     (Consider location/radiation/quality/duration/timing/severity/associated sxs/prior Treatment) HPI Comments: Patient reports a 2 week history of a non-productive "nagging" cough; however in the last 2 days she has been having malaise, low grade temps and myalgias. Cough is still non-productive and no upper resp symptoms.   Patient is a 80 y.o. female presenting with URI. The history is provided by the patient.  URI   Past Medical History  Diagnosis Date  . Persistent atrial fibrillation   . sinus node dysfunction//post termination pauses   . HTN (hypertension)   . CAD (coronary artery disease)     LHC 5/08:  pOM 20%, pRCA 20-30%, EF 60%  . NASH (nonalcoholic steatohepatitis)   . PMR (polymyalgia rheumatica)     no steriods for 5 years  . HLD (hyperlipidemia)   . Neuromuscular disorder   . Hepatitis     hx of medication induced hepatitis (Vytorin per pt)  . Ascending aorta enlargement     4.8 cm per echo 08/07/11; 5.0 by CT in Jan 2013  . Pacemaker-[employer] 11/12/2011    Implanted 2013   . Hx of echocardiogram     echo 1/13: EF 55%, Gr 2 diast dysfn, Asc Ao aneurysm 4.8 cm, mild MR, mod LAE, mild RAE   Past Surgical History  Procedure Laterality Date  . Umbilical hernia repair  1950's  . Incision and drainage breast abscess  1973  . Cardiac catheterization    . Hernia repair    . Pacemaker insertion  Jan 2013  . Cardioversion  03/02/2012    Procedure: CARDIOVERSION;  Surgeon: Lelon Perla, MD;  Location: [facility] ENDOSCOPY;  Service: Cardiovascular;  Laterality: N/A;  . Cardioversion  03/13/2012    Procedure: CARDIOVERSION;  Surgeon: Josue Hector, MD;  Location: [facility] ENDOSCOPY;  Service: Cardiovascular;  Laterality: N/A;  . Cardioversion  04/15/2012    Procedure: CARDIOVERSION;  Surgeon: [name], MD;  Location: [facility] ENDOSCOPY;  Service:  Cardiovascular;  Laterality: N/A;   Family History  Problem Relation Age of Onset  . Coronary artery disease    . Alzheimer's disease     History  Substance Use Topics  . Smoking status: Former Smoker    Types: Cigarettes    Quit date: 07/16/1990  . Smokeless tobacco: Never Used  . Alcohol Use: No   OB History   Grav Para Term Preterm Abortions TAB SAB Ect Mult Living                 Review of Systems  All other systems reviewed and are negative.      Allergies  Penicillins; Codeine; Statins; and Tylenol  Home Medications   Current Outpatient Rx  Name  Route  Sig  Dispense  Refill  . amiodarone (PACERONE) 200 MG tablet      Take one tablet (236m total) by mouth 5 days per week   20 tablet   11   . Calcium Carbonate-Vitamin D (CALCIUM + D) 600-200 MG-UNIT TABS   Oral   Take 1 tablet by mouth 2 (two) times daily.          . cetirizine (ZYRTEC) 10 MG tablet   Oral   Take 10 mg by mouth as needed.          . Cholecalciferol (VITAMIN D3) 1000 UNITS tablet   Oral  Take 1,000 Units by mouth daily.           . hydrochlorothiazide (HYDRODIURIL) 25 MG tablet   Oral   Take 1 tablet (25 mg total) by mouth daily.   90 tablet   3   . levofloxacin (LEVAQUIN) 250 MG tablet   Oral   Take 1 tablet (250 mg total) by mouth daily.   7 tablet   0   . multivitamin (THERAGRAN) per tablet   Oral   Take 1 tablet by mouth daily.           . nadolol (CORGARD) 40 MG tablet      Take three tablets by mouth every evening   90 tablet   11   . potassium chloride SA (K-DUR,KLOR-CON) 20 MEQ tablet      2 tablets daily and 3 tablets every other day   75 tablet   6   . valsartan (DIOVAN) 320 MG tablet   Oral   Take 1 tablet (320 mg total) by mouth daily.   30 tablet   11   . warfarin (COUMADIN) 2.5 MG tablet      Take as directed by coumadin clinic   40 tablet   3   . WELCHOL 625 MG tablet   Oral   Take 625 mg by mouth 2 (two) times daily with a  meal.           BP 158/83  Pulse 86  Temp(Src) 98.9 F (37.2 C) (Oral)  Resp 18  SpO2 98% Physical Exam  Nursing note and vitals reviewed. Constitutional: She is oriented to person, place, and time. She appears well-developed and well-nourished. No distress.  HENT:  Head: Normocephalic and atraumatic.  Right Ear: External ear normal.  Left Ear: External ear normal.  Mouth/Throat: Oropharynx is clear and moist. No oropharyngeal exudate.  Eyes: Pupils are equal, round, and reactive to light. Right eye exhibits no discharge. Left eye exhibits no discharge.  Neck: Normal range of motion. Neck supple. No tracheal deviation present.  Cardiovascular: Normal rate and regular rhythm.   Pulmonary/Chest: Effort normal. No respiratory distress. She has no wheezes. She has no rales.  Rhonci bilateral bases  Abdominal: Soft.  Musculoskeletal: Normal range of motion.  Lymphadenopathy:    She has no cervical adenopathy.  Neurological: She is alert and oriented to person, place, and time. Coordination normal.  Skin: Skin is warm and dry. She is not diaphoretic.  Psychiatric: Her behavior is normal.    ED Course  Procedures (including critical care time) Labs Review Labs Reviewed - No data to display Imaging Review No results found.    MDM   Final diagnoses:  Respiratory infection  Cough    Greater than 2 weeks of cough; most likely bacterial at this point, cover with an abx. Delsym for cough. F/U with PCP if worsens.    Bjorn Pippin, PA-C 08/29/13 [phone]

## 2013-08-29 NOTE — ED Provider Notes (Signed)
Medical screening examination/treatment/procedure(s) were performed by a resident physician or non-physician practitioner and as the supervising physician I was immediately available for consultation/collaboration.  Lynne Leader, MD    Gregor Hams, MD 08/29/13 [phone]

## 2013-09-03 ENCOUNTER — Ambulatory Visit (INDEPENDENT_AMBULATORY_CARE_PROVIDER_SITE_OTHER): Payer: Medicare Other | Admitting: Pharmacist

## 2013-09-03 DIAGNOSIS — Z7901 Long term (current) use of anticoagulants: Secondary | ICD-10-CM

## 2013-09-03 DIAGNOSIS — I4891 Unspecified atrial fibrillation: Secondary | ICD-10-CM

## 2013-09-03 LAB — POCT INR: INR: 2.8

## 2013-09-13 ENCOUNTER — Other Ambulatory Visit: Payer: Self-pay

## 2013-09-13 MED ORDER — HYDROCHLOROTHIAZIDE 25 MG PO TABS: 25.0000 mg | ORAL_TABLET | Freq: Every day | ORAL | Status: DC

## 2013-09-22 LAB — PROTIME-INR: INR: 3.2 — AB (ref 0.9–1.1)

## 2013-09-23 ENCOUNTER — Ambulatory Visit (INDEPENDENT_AMBULATORY_CARE_PROVIDER_SITE_OTHER): Payer: Medicare Other | Admitting: Internal Medicine

## 2013-09-23 DIAGNOSIS — I4891 Unspecified atrial fibrillation: Secondary | ICD-10-CM

## 2013-09-23 DIAGNOSIS — Z7901 Long term (current) use of anticoagulants: Secondary | ICD-10-CM

## 2013-09-30 ENCOUNTER — Other Ambulatory Visit: Payer: Self-pay

## 2013-09-30 DIAGNOSIS — I7789 Other specified disorders of arteries and arterioles: Secondary | ICD-10-CM

## 2013-10-04 ENCOUNTER — Ambulatory Visit (INDEPENDENT_AMBULATORY_CARE_PROVIDER_SITE_OTHER): Payer: Medicare Other | Admitting: *Deleted

## 2013-10-04 DIAGNOSIS — Z7901 Long term (current) use of anticoagulants: Secondary | ICD-10-CM

## 2013-10-04 DIAGNOSIS — Z5181 Encounter for therapeutic drug level monitoring: Secondary | ICD-10-CM

## 2013-10-04 DIAGNOSIS — I4891 Unspecified atrial fibrillation: Secondary | ICD-10-CM

## 2013-10-04 LAB — POCT INR: INR: 3

## 2013-10-11 ENCOUNTER — Other Ambulatory Visit: Payer: Self-pay | Admitting: Cardiothoracic Surgery

## 2013-10-11 LAB — CREATININE, SERUM: Creat: 0.87 mg/dL (ref 0.50–1.10)

## 2013-10-11 LAB — BUN: BUN: 15 mg/dL (ref 6–23)

## 2013-10-18 ENCOUNTER — Telehealth: Payer: Self-pay | Admitting: *Deleted

## 2013-10-18 NOTE — Telephone Encounter (Signed)
Called patient this morning to inform her that handicap parking form completed and left at front desk for pick up. She is agreeable to this.  We also discussed possible GXT testing - pt tells me that since adjustments at last visit she has felt better, and she has bad knees making it hard to walk at times. After discussion with Caryl Comes, we will hold off on testing for now.  Left message explaining this and asking pt to call us if symptoms re-occur/worsen and we can consider pursing testing at a later date if necessary.

## 2013-10-25 ENCOUNTER — Ambulatory Visit (INDEPENDENT_AMBULATORY_CARE_PROVIDER_SITE_OTHER): Payer: Medicare Other | Admitting: Pharmacist

## 2013-10-25 DIAGNOSIS — Z7901 Long term (current) use of anticoagulants: Secondary | ICD-10-CM

## 2013-10-25 DIAGNOSIS — Z5181 Encounter for therapeutic drug level monitoring: Secondary | ICD-10-CM

## 2013-10-25 DIAGNOSIS — I4891 Unspecified atrial fibrillation: Secondary | ICD-10-CM

## 2013-10-25 LAB — POCT INR: INR: 2.6

## 2013-10-27 ENCOUNTER — Encounter: Payer: Medicare Other | Admitting: *Deleted

## 2013-10-27 ENCOUNTER — Other Ambulatory Visit: Payer: Self-pay | Admitting: *Deleted

## 2013-10-27 DIAGNOSIS — I495 Sick sinus syndrome: Secondary | ICD-10-CM

## 2013-10-27 LAB — MDC_IDC_ENUM_SESS_TYPE_REMOTE
Battery Impedance: 162 Ohm
Battery Voltage: 2.79 V
Brady Statistic AP VP Percent: 1 %
Brady Statistic AP VS Percent: 98 %
Brady Statistic AS VP Percent: 0 %
Brady Statistic AS VS Percent: 0 %
Date Time Interrogation Session: 20150415110426
Lead Channel Impedance Value: 828 Ohm
Lead Channel Pacing Threshold Amplitude: 0.5 V
Lead Channel Pacing Threshold Amplitude: 0.625 V
Lead Channel Pacing Threshold Pulse Width: 0.4 ms
Lead Channel Setting Pacing Amplitude: 2.5 V
Lead Channel Setting Pacing Pulse Width: 0.4 ms
MDC IDC MSMT BATTERY REMAINING LONGEVITY: 124 mo
MDC IDC MSMT LEADCHNL RA IMPEDANCE VALUE: 407 Ohm
MDC IDC MSMT LEADCHNL RV PACING THRESHOLD PULSEWIDTH: 0.4 ms
MDC IDC MSMT LEADCHNL RV SENSING INTR AMPL: 16 mV
MDC IDC SET LEADCHNL RA PACING AMPLITUDE: 2 V
MDC IDC SET LEADCHNL RV SENSING SENSITIVITY: 5.6 mV

## 2013-10-27 MED ORDER — WARFARIN SODIUM 2.5 MG PO TABS: ORAL_TABLET | ORAL | Status: DC

## 2013-11-04 ENCOUNTER — Ambulatory Visit (INDEPENDENT_AMBULATORY_CARE_PROVIDER_SITE_OTHER): Payer: Medicare Other | Admitting: Cardiothoracic Surgery

## 2013-11-04 ENCOUNTER — Ambulatory Visit
Admission: RE | Admit: 2013-11-04 | Discharge: 2013-11-04 | Disposition: A | Discharge status: Home / Self Care | Payer: Medicare Other | Source: Ambulatory Visit | Attending: Cardiothoracic Surgery | Admitting: Cardiothoracic Surgery

## 2013-11-04 ENCOUNTER — Encounter: Payer: Self-pay | Admitting: Cardiothoracic Surgery

## 2013-11-04 VITALS — BP 133/73 | HR 78 | Resp 16 | Ht 67.0 in | Wt 179.0 lb

## 2013-11-04 DIAGNOSIS — I712 Thoracic aortic aneurysm, without rupture, unspecified: Secondary | ICD-10-CM

## 2013-11-04 DIAGNOSIS — I7789 Other specified disorders of arteries and arterioles: Secondary | ICD-10-CM

## 2013-11-04 MED ORDER — IOHEXOL 350 MG/ML SOLN
80.0000 mL | Freq: Once | INTRAVENOUS | Status: AC | PRN
  Administered 2013-11-04: 80 mL via INTRAVENOUS

## 2013-11-04 NOTE — Progress Notes (Signed)
[address]       [place] [address]             [phone]        Jaleya W Doeden [facility] Medical Record #[id number] Date of Birth: [birth year]  Referring: [name], MD Primary Care: [name], MD  Chief Complaint:    Chief Complaint  Patient presents with  . TAA    1 yr f/u with CTA CHEST    History of Present Illness:    Patient is a 80 -year-old female with a history of atrial fib on Coumadin. On July 29 2011 a pacemaker was placed. In followup an echocardiogram was performed that demonstrated dilated descending aorta. Further evaluation with a CT scan of the chest was performed. Patient's referred for evaluation of dilated acescending aorta. She has no history of aortic valve disease.  The patient returns today with a followup CT scan for comparison to the scan done in 12 months ago.  She has no family history of aneurysm disease or sudden death, her grandfather did die in his 71s of a cerebral aneurysm.  She has been diligent about blood pressure control and taking her beta blocker but notes it is as high as 140's /80's  Patient denies any shortness of breath or chest discomfort at rest, some mild sob with exertion , no angina Current Activity/ Functional Status: Patient is independent with mobility/ambulation, transfers, ADL's, IADL's.   Past Medical History  Diagnosis Date  . Persistent atrial fibrillation   . sinus node dysfunction//post termination pauses   . HTN (hypertension)   . CAD (coronary artery disease)     LHC 5/08:  pOM 20%, pRCA 20-30%, EF 60%  . NASH (nonalcoholic steatohepatitis)   . PMR (polymyalgia rheumatica)     no steriods for 5 years  . HLD (hyperlipidemia)   . Neuromuscular disorder   . Hepatitis     hx of medication induced hepatitis (Vytorin per pt)  . Ascending aorta enlargement     4.8 cm per echo 08/07/11; 5.0 by CT in Jan 2013  . Pacemaker-Medtronic 11/12/2011    Implanted 2013   . Hx of  echocardiogram     echo 1/13: EF 55%, Gr 2 diast dysfn, Asc Ao aneurysm 4.8 cm, mild MR, mod LAE, mild RAE    Past Surgical History  Procedure Laterality Date  . Umbilical hernia repair  1950's  . Incision and drainage breast abscess  1973  . Cardiac catheterization    . Hernia repair    . Pacemaker insertion  Jan 2013  . Cardioversion  03/02/2012    Procedure: CARDIOVERSION;  Surgeon: Lelon Perla, MD;  Location: [facility] ENDOSCOPY;  Service: Cardiovascular;  Laterality: N/A;  . Cardioversion  03/13/2012    Procedure: CARDIOVERSION;  Surgeon: Josue Hector, MD;  Location: [facility] ENDOSCOPY;  Service: Cardiovascular;  Laterality: N/A;  . Cardioversion  04/15/2012    Procedure: CARDIOVERSION;  Surgeon: [name], MD;  Location: [facility] ENDOSCOPY;  Service: Cardiovascular;  Laterality: N/A;    Family History  Problem Relation Age of Onset  . Coronary artery disease    . Alzheimer's disease        History  Smoking status  . Former Smoker  . Types: Cigarettes  . Quit date: 07/16/1990  Smokeless tobacco  . Never Used    History  Alcohol Use No     Allergies  Allergen Reactions  . Penicillins     REACTION:  anaphylaxis  . Codeine     REACTION: feel funny  . Statins     REACTION: elevated LFT's  . Tylenol [Acetaminophen] Other (See Comments)    If taken with Vytorin at risk for liver damage    Current Outpatient Prescriptions  Medication Sig Dispense Refill  . amiodarone (PACERONE) 200 MG tablet Take one tablet (261m total) by mouth 5 days per week  20 tablet  11  . Calcium Carbonate-Vitamin D (CALCIUM + D) 600-200 MG-UNIT TABS Take 1 tablet by mouth 2 (two) times daily.       . cetirizine (ZYRTEC) 10 MG tablet Take 10 mg by mouth as needed.       . Cholecalciferol (VITAMIN D3) 1000 UNITS tablet Take 1,000 Units by mouth daily.        . hydrochlorothiazide (HYDRODIURIL) 25 MG tablet Take 1 tablet (25 mg total) by mouth daily.  90 tablet  3  . multivitamin (THERAGRAN) per  tablet Take 1 tablet by mouth daily.        . nadolol (CORGARD) 40 MG tablet Take three tablets by mouth every evening  90 tablet  11  . potassium chloride SA (K-DUR,KLOR-CON) 20 MEQ tablet 2 tablets daily and 3 tablets every other day  75 tablet  6  . valsartan (DIOVAN) 320 MG tablet Take 1 tablet (320 mg total) by mouth daily.  30 tablet  11  . warfarin (COUMADIN) 2.5 MG tablet Take as directed by coumadin clinic  40 tablet  3  . WELCHOL 625 MG tablet Take 625 mg by mouth 2 (two) times daily with a meal.        No current facility-administered medications for this visit.       Review of Systems:     Cardiac Review of Systems: Y or N  Chest Pain [ n   ]  Resting SOB [   ] Exertional SOB  [n  ]  Orthopnea [Florencio.Farrier]   Pedal Edema [ n  ]    Palpitations [Blue.Reese ] Syncope  [Florencio.Farrier ]   Presyncope [Florencio.Farrier  ]  General Review of Systems: [Y] = yes [  ]=no Constitional: recent weight change [  ]; anorexia [  ]; fatigue [  ]; nausea [  ]; night sweats [  ]; fever [  ]; or chills [  ];                                                                                                                                          Dental: poor dentition[  n]; Last Dentist visit: regular every 6 months  Eye : blurred vision [  ]; diplopia [   ]; vision changes [  ];  Amaurosis fugax[  ]; Resp: cough [  ];  wheezing[  ];  hemoptysis[  ]; shortness of breath[  ]; paroxysmal nocturnal dyspnea[  ];  dyspnea on exertion[  ]; or orthopnea[  ];  GI:  gallstones[  ], vomiting[  ];  dysphagia[  ]; melena[  ];  hematochezia [  ]; heartburn[  ];   Hx of  Colonoscopy[  ]; GU: kidney stones [  ]; hematuria[  ];   dysuria [  ];  nocturia[  ];  history of     obstruction [  ];             Skin: rash, swelling[  ];, hair loss[  ];  peripheral edema[  ];  or itching[  ]; Musculosketetal: myalgias[ y ];  joint swelling[ y ];  joint erythema[  ];  joint pain[  ];  back pain[  ];  Heme/Lymph: bruising[ n ];  bleeding[  n];  anemia[  ];  Neuro:  TIA[  ];  headaches[  ];  stroke[  ];  vertigo[  ];  seizures[  ];   paresthesias[  ];  difficulty walking[  ];  Psych:depression[  ]; anxiety[  ];  Endocrine: diabetes[ n ];  thyroid dysfunction[ n ];  Immunizations: Flu Blue.Reese  ]; Pneumococcal[y  ];  Other:  Physical Exam: BP 133/73  Pulse 78  Resp 16  Ht 5' 7"  (1.702 m)  Wt 179 lb (81.194 kg)  BMI 28.03 kg/m2  SpO2 98%  General appearance: alert, cooperative, appears stated age and no distress Neurologic: intact Heart: irregularly irregular rhythm, she has no murmur of aortic insufficiency or MR Lungs: clear to auscultation bilaterally Abdomen: soft, non-tender; bowel sounds normal; no masses,  no organomegaly Extremities: extremities normal, atraumatic, no cyanosis or edema and Homans sign is negative, no sign of DVT, does have venous statis changes both lower extremities No carotid bruits, 1+ dp and PT pulses  Diagnostic Studies & Laboratory data:     Recent Radiology Findings:  Ct Angio Chest Aorta W/cm &/or Wo/cm  11/04/2013   CLINICAL DATA:  Ascending aortic enlargement  EXAM: CT ANGIOGRAPHY CHEST WITH CONTRAST  TECHNIQUE: Multidetector CT imaging of the chest was performed using the standard protocol during bolus administration of intravenous contrast. Multiplanar CT image reconstructions and MIPs were obtained to evaluate the vascular anatomy.  CONTRAST:  73m OMNIPAQUE IOHEXOL 350 MG/ML SOLN  COMPARISON:  09/23/2012  FINDINGS: Stable small hypodense thyroid lesions. Dual lead pacer noted. At the level of the pulmonary artery on image 57 of series 4, the ascending aortic transverse caliber is 4.9 cm, no change from 09/23/2012. Central arch 3.2 cm on image 28 of series 4 (stable) and proximal descending thoracic aorta 3.1 cm (likewise stable) if scattered atherosclerotic calcification involving the ascending aorta, aortic arch, descending thoracic aorta, and branch vasculature noted. Mild cardiomegaly is again noted. No pathologic  thoracic adenopathy or pericardial effusion.  There is a slightly greater amount of right eccentric mural thrombus in the descending thoracic aorta adjacent to the aortic hiatus on image 89 of series 4. The celiac trunk and nodes prompt proximal branches appear patent. An exophytic right kidney upper pole lesion is likely a cyst, measuring 13 Hounsfield units.  Stable mild right lower lobe scarring. Thoracic kyphosis and thoracic spondylosis noted.  Review of the MIP images confirms the above findings.  IMPRESSION: 1. No change in size or appearance of ascending aortic aneurysm (which measures up to 4.9 cm) or descending thoracic aortic ectasia. 2. Thoracic kyphosis and spondylosis.   Electronically Signed   By: WSherryl BartersM.D.   On: 11/04/2013 09:10   Ct Angio Chest  W/cm &/or Wo Cm  09/23/2012  *RADIOLOGY REPORT*  Clinical Data: Follow-up ascending aortic aneurysm.  CT ANGIOGRAPHY CHEST  Technique:  Multidetector CT imaging of the chest using the standard protocol during bolus administration of intravenous contrast. Multiplanar reconstructed images including MIPs were obtained and reviewed to evaluate the vascular anatomy.  Contrast: 53m OMNIPAQUE IOHEXOL 350 MG/ML SOLN  Comparison: 01/28/2012.  Findings: Low attenuation lesions in the thyroid measure up to 1.4 cm on the right, as before.  Ascending aorta measures up to 4.9 cm, unchanged from the prior examination.  Transverse aorta measures up to 3.2 cm and proximal descending thoracic aorta, 3.1 cm, also unchanged.  There is atherosclerotic irregularity and calcification of the aorta with calcification seen in the coronary arteries. Heart is mildly enlarged.  No pericardial effusion.  No pathologically enlarged mediastinal, hilar or axillary lymph nodes.  Scattered scarring in the right lower lobe and along the left major fissure.  No pleural fluid.  Airway is unremarkable.  Incidental imaging of the upper abdomen shows no acute findings. No worrisome  lytic or sclerotic lesions.  IMPRESSION:  1.  Stable ascending aortic aneurysm. Transverse and descending thoracic aortic diameters are stable as well. 2.  Coronary artery calcification.   Original Report Authenticated By: MLorin Picket M .     ECHO: most recent echo 2013  Transthoracic Echocardiography  Patient: WMeagon, DuskinMR #: [id number] Date: 08/07/2011 Gender: F Age: [age]Height: 167.6cm Weight: 78.5kg BSA: 1.816m Pt. Status: Room:  SONOGRAPHER PaCharlann NossTTENDING DaLoralie ChampagneMD PERFORMING MoZacarias PontesSite 3 ORDERING KlVirl AxeECarney Livingc: Dr. JaJani Gravel------------------------------------------------------------ LV EF: 55% - 60%  ------------------------------------------------------------ Indications: Atrial fibrillation - 427.31.  ------------------------------------------------------------ History: PMH: Acquired from the patient and from the patient's chart. Atrial fibrillation. Risk factors: Hypertension. Dyslipidemia.  ------------------------------------------------------------ Study Conclusions  - Left ventricle: The cavity size was normal. Wall thickness was normal. Systolic function was normal. The estimated ejection fraction was 55%. Although no diagnostic regional wall motion abnormality was identified, this possibility cannot be completely excluded on the basis of this study. Features are consistent with a pseudonormal left ventricular filling pattern, with concomitant abnormal relaxation and increased filling pressure (grade 2 diastolic dysfunction). - Aortic valve: There was no stenosis. - Aorta: Normal aortic root size. Ascending aortic aneurysm reaching 4.8 cm. - Mitral valve: Mild regurgitation. - Left atrium: The atrium was moderately dilated. - Right ventricle: The cavity size was normal. Pacer wire or catheter noted in right ventricle. Systolic function was normal. - Right atrium: The atrium was  mildly dilated. - Tricuspid valve: Peak RV-RA gradient:2133mg (S). - Pulmonary arteries: PA systolic pressure 27-85-63Hg. - Systemic veins: IVC measured 2.0 cm with normal respirophasic variation, suggesting RA pressure 6-10 mmHg. - Pericardium, extracardiac: A trivial pericardial effusion was identified. Impressions:  - Normal LV size and systolic function, EF 55%14%oderate diastolic dysfunction. Biatrial enlargement. Normal RV size and systolic function. Mild mitral regurgitation. Ascending aortic aneurysm reaching 4.8 cm by echo. Would consider MRA chest to fully assess thoracic aorta. Transthoracic echocardiography. M-mode, complete 2D, spectral Doppler, and color Doppler. Height: Height: 167.6cm. Height: 66in. Weight: Weight: 78.5kg. Weight: 172.6lb. Body mass index: BMI: 27.9kg/m^2. Body surface area: BSA: 1.20m20mBlood pressure: 164/83. Patient status: Outpatient. Location: [place] [facility]  ------------------------------------------------------------  ------------------------------------------------------------ Left ventricle: The cavity size was normal. Wall thickness was normal. Systolic function was normal. The estimated ejection fraction was 55%. Although no diagnostic regional wall motion abnormality  was identified, this possibility cannot be completely excluded on the basis of this study. Features are consistent with a pseudonormal left ventricular filling pattern, with concomitant abnormal relaxation and increased filling pressure (grade 2 diastolic dysfunction).  ------------------------------------------------------------ Aortic valve: Trileaflet; mildly calcified leaflets. Doppler: There was no stenosis. No regurgitation.  ------------------------------------------------------------ Aorta: Normal aortic root size. Ascending aortic aneurysm reaching 4.8 cm.  ------------------------------------------------------------ Mitral valve: Doppler: There was no  evidence for stenosis. Mild regurgitation.  ------------------------------------------------------------ Left atrium: The atrium was moderately dilated.  ------------------------------------------------------------ Right ventricle: The cavity size was normal. Pacer wire or catheter noted in right ventricle. Systolic function was normal.  ------------------------------------------------------------ Pulmonic valve: Structurally normal valve. Cusp separation was normal. Doppler: Transvalvular velocity was within the normal range. No regurgitation.  ------------------------------------------------------------ Tricuspid valve: Doppler: Trivial regurgitation.  ------------------------------------------------------------ Pulmonary artery: PA systolic pressure 62-26 mmHg.  ------------------------------------------------------------ Right atrium: The atrium was mildly dilated.  ------------------------------------------------------------ Pericardium: A trivial pericardial effusion was identified.  ------------------------------------------------------------ Systemic veins: IVC measured 2.0 cm with normal respirophasic variation, suggesting RA pressure 6-10 mmHg.  ------------------------------------------------------------ Post procedure conclusions Ascending Aorta:  - Normal aortic root size. Ascending aortic aneurysm reaching 4.8 cm.  ------------------------------------------------------------  2D measurements Normal Doppler measurements Norma Left ventricle l LVID ED, 44.3 mm 43-52 Left ventricle chord, Ea, lat 8.6 cm/s ----- PLAX ann, tiss 6 LVID ES, 30.9 mm 23-38 DP chord, E/Ea, lat 6.6 ----- PLAX ann, tiss 7 FS, chord, 30 % >29 DP PLAX Ea, med 4.9 cm/s ----- LVPW, ED 8.92 mm ------ ann, tiss 3 IVS/LVPW 0.88 <1.3 DP ratio, ED E/Ea, med 11. ----- Ventricular septum ann, tiss 72 IVS, ED 7.85 mm ------ DP LVOT LVOT Diam, S 21 mm ------ Peak vel, 103 cm/s  ----- Area 3.46 cm^2 ------ S Diam 21 mm ------ VTI, S 25. cm ----- Aorta 8 Root diam, 32 mm ------ HR 60 bpm ----- ED Stroke 89. ml ----- Left atrium vol 4 AP dim 2.23 cm/m^2 <2.2 Cardiac 5.4 L/min ----- index output Cardiac 2.9 L/(min-m ----- index ^2) Stroke 47. ml/m^2 ----- index 5 Mitral valve Peak E 57. cm/s ----- vel 8 Peak A 54. cm/s ----- vel 8 Decelerat 391 ms 150-2 ion time 30 Peak E/A 1.1 ----- ratio Tricuspid valve Peak 19 mm Hg ----- RV-RA gradient, S  ------------------------------------------------------------ Prepared and Electronically Authenticated by  Loralie Champagne, MD 2013-01-23T20:42:[id number]   Followup CT scan of the chest done July 2013 Ct Angio Chest W/cm &/or Wo Cm  01/28/2012  *RADIOLOGY REPORT*  Clinical Data: Follow-up aortic aneurysm.  CT ANGIOGRAPHY CHEST  Technique:  Multidetector CT imaging of the chest using the standard protocol during bolus administration of intravenous contrast. Multiplanar reconstructed images including MIPs were obtained and reviewed to evaluate the vascular anatomy.  Contrast: 166m OMNIPAQUE IOHEXOL 300 MG/ML  SOLN  Comparison: 08/13/2011  Findings: Again noted is the mild aneurysmal dilatation of the ascending aorta, currently measuring maximally 4.7 cm compared 5.0 cm previously.  No dissection or significant change.  Proximal descending thoracic aorta measures maximally 3.1 cm.  Descending thoracic aorta at the aortic hiatus measures 2.8 cm.  Distal thoracic aortic calcifications noted.  Left-sided pacer remains in place, unchanged.  Coronary artery calcifications present. No mediastinal, hilar, or axillary adenopathy.  Linear scarring in the lung bases.  Lungs otherwise clear.  No pleural effusions.  No suspicious pulmonary nodules or masses.  Visualized chest wall soft tissues are unremarkable.  Imaging into the upper abdomen demonstrates no acute findings.  No acute bony abnormality.  Mild degenerative changes and  kyphosis in the thoracic spine.  IMPRESSION: Stable ascending aortic aneurysm, measuring maximally 4.7 cm.  Coronary artery disease.  Original Report Authenticated By: Raelyn Number, M.D.    Recent Lab Findings: Lab Results  Component Value Date   WBC 8.7 04/14/2012   HGB 13.3 04/14/2012   HCT 41.1 04/14/2012   PLT 278.0 04/14/2012   GLUCOSE 89 11/20/2012   ALT 25 07/26/2013   AST 27 07/26/2013   NA 139 11/20/2012   K 3.9 11/20/2012   CL 104 11/20/2012   CREATININE 0.87 10/11/2013   BUN 15 10/11/2013   CO2 27 11/20/2012   TSH 0.78 07/26/2013   INR 2.6 10/25/2013      Assessment / Plan:  1/  ascending aortic aneurysm (which measures up to 4.9 cm)  with no change by CTA over the past year          Aortic Size Index=      4.9   /Body surface area is 1.96 meters squared. = 2.47 index         Ascending/decending= 1.6               < 2.75 cm/m2      4% risk per year               2.75 to 4.25          8% risk per year              > 4.25 cm/m2    20% risk per year  2/descending thoracic aortic ectasia 3/Thoracic kyphosis and spondylosis 4/afib on coumadin  I have reviewed the patient'sCT scan, she does have a dilated ascending aorta approximately 4.9 cm maximum diameter without evidence of aortic valve disease. There has been no change over a 18  month interval. At this point with her age of 36 years size less then 5.5 cm and index 2.47  I would not recommend elective repair of her ascending aorta as her yearly risk from aortic  rupture is 4%. I've discussed with her the risks of dissection and consequences of that versus the risk of elective repair. She understands a good blood pressure control including beta blockers important. We'll plan to see her back in approximately 12 months with a followup CTA of the aorta to evaluate any change in size.    Grace Isaac MD  Beeper [phone] Office [phone] 11/04/2013 10:21 AM

## 2013-11-11 ENCOUNTER — Encounter: Payer: Self-pay | Admitting: *Deleted

## 2013-11-19 ENCOUNTER — Telehealth: Payer: Self-pay | Admitting: Internal Medicine

## 2013-11-19 NOTE — Telephone Encounter (Signed)
New message          Pt is gaining water weight and feet are swelling. How much lasix can she take?

## 2013-11-23 ENCOUNTER — Ambulatory Visit (INDEPENDENT_AMBULATORY_CARE_PROVIDER_SITE_OTHER): Payer: Medicare Other | Admitting: Pharmacist

## 2013-11-23 DIAGNOSIS — Z5181 Encounter for therapeutic drug level monitoring: Secondary | ICD-10-CM

## 2013-11-23 DIAGNOSIS — I4891 Unspecified atrial fibrillation: Secondary | ICD-10-CM

## 2013-11-23 DIAGNOSIS — Z7901 Long term (current) use of anticoagulants: Secondary | ICD-10-CM

## 2013-11-23 LAB — POCT INR: INR: 2.8

## 2013-11-24 NOTE — Telephone Encounter (Signed)
Patient was in office yesterday for Coumadin check and asked me to look at her pedal edema. Noted 2+ pitting upon assessment. Pt stated she had gained 3.4 pds since last Monday, and swelling has never stayed like it is this time. infomred patient that I would  Review this w/ Dr. Caryl Comes and let her know recommendations. She was agreeable to this.    Instructed patient to take Lasix 20 mg daily for the next 5 days - she then proceeds to tell me that she has been taking Lasix since last Tuesday (she did not tell me this yesterday when we spoke and states she may have forgotten to tell me). She also wants [place] aware that she watches her sodium intake. Explained that I would have Dr. Caryl Comes review again for suggestions (I told her that she may need to follow up with her PCP about this matter, but I would confirm w/ Caryl Comes first). She is agreeable to plan.

## 2013-11-26 NOTE — Telephone Encounter (Signed)
Reviewed w/ Caryl Comes this morning. Instructed pt to take Lasix 40 mg daily x 3 days. (pt is currently taking potassium pills). Pt purchased compression hose yesterday, which seems to be helping. She is also trying to elevate feet as advised. She will call next week if no improvement. Patient verbalized understanding and agreeable to plan.

## 2013-11-29 ENCOUNTER — Telehealth: Payer: Self-pay | Admitting: Internal Medicine

## 2013-11-29 ENCOUNTER — Encounter: Payer: Self-pay | Admitting: Internal Medicine

## 2013-11-29 NOTE — Telephone Encounter (Signed)
New message     Was told to take an extra dosage of medication . Call the office back     The swelling has gone away everything is fine.

## 2013-12-20 ENCOUNTER — Ambulatory Visit (INDEPENDENT_AMBULATORY_CARE_PROVIDER_SITE_OTHER): Payer: Medicare Other

## 2013-12-20 DIAGNOSIS — Z5181 Encounter for therapeutic drug level monitoring: Secondary | ICD-10-CM

## 2013-12-20 DIAGNOSIS — Z7901 Long term (current) use of anticoagulants: Secondary | ICD-10-CM

## 2013-12-20 DIAGNOSIS — I4891 Unspecified atrial fibrillation: Secondary | ICD-10-CM

## 2013-12-20 LAB — POCT INR: INR: 3.6

## 2013-12-29 ENCOUNTER — Encounter: Payer: Self-pay | Admitting: Cardiology

## 2014-01-03 ENCOUNTER — Ambulatory Visit (INDEPENDENT_AMBULATORY_CARE_PROVIDER_SITE_OTHER): Payer: Medicare Other | Admitting: *Deleted

## 2014-01-03 DIAGNOSIS — Z5181 Encounter for therapeutic drug level monitoring: Secondary | ICD-10-CM

## 2014-01-03 DIAGNOSIS — Z7901 Long term (current) use of anticoagulants: Secondary | ICD-10-CM

## 2014-01-03 DIAGNOSIS — I4891 Unspecified atrial fibrillation: Secondary | ICD-10-CM

## 2014-01-03 LAB — POCT INR: INR: 2.3

## 2014-01-25 ENCOUNTER — Ambulatory Visit: Payer: Medicare Other | Admitting: Cardiology

## 2014-01-25 ENCOUNTER — Ambulatory Visit (INDEPENDENT_AMBULATORY_CARE_PROVIDER_SITE_OTHER): Payer: Medicare Other | Admitting: *Deleted

## 2014-01-25 DIAGNOSIS — I4891 Unspecified atrial fibrillation: Secondary | ICD-10-CM

## 2014-01-25 DIAGNOSIS — Z5181 Encounter for therapeutic drug level monitoring: Secondary | ICD-10-CM

## 2014-01-25 DIAGNOSIS — Z7901 Long term (current) use of anticoagulants: Secondary | ICD-10-CM

## 2014-01-25 LAB — POCT INR: INR: 2.4

## 2014-01-26 ENCOUNTER — Other Ambulatory Visit: Payer: Self-pay

## 2014-01-26 DIAGNOSIS — I5032 Chronic diastolic (congestive) heart failure: Secondary | ICD-10-CM

## 2014-01-26 MED ORDER — NADOLOL 40 MG PO TABS: ORAL_TABLET | ORAL | Status: DC

## 2014-01-27 ENCOUNTER — Other Ambulatory Visit: Payer: Self-pay

## 2014-01-27 DIAGNOSIS — Z1231 Encounter for screening mammogram for malignant neoplasm of breast: Secondary | ICD-10-CM

## 2014-02-15 ENCOUNTER — Ambulatory Visit (INDEPENDENT_AMBULATORY_CARE_PROVIDER_SITE_OTHER): Payer: Medicare Other | Admitting: Internal Medicine

## 2014-02-15 ENCOUNTER — Encounter: Payer: Self-pay | Admitting: Internal Medicine

## 2014-02-15 VITALS — BP 148/81 | HR 84 | Ht 66.0 in | Wt 188.0 lb

## 2014-02-15 DIAGNOSIS — I480 Paroxysmal atrial fibrillation: Secondary | ICD-10-CM

## 2014-02-15 DIAGNOSIS — I4891 Unspecified atrial fibrillation: Secondary | ICD-10-CM

## 2014-02-15 DIAGNOSIS — I4819 Other persistent atrial fibrillation: Secondary | ICD-10-CM

## 2014-02-15 DIAGNOSIS — I495 Sick sinus syndrome: Secondary | ICD-10-CM

## 2014-02-15 DIAGNOSIS — Z950 Presence of cardiac pacemaker: Secondary | ICD-10-CM

## 2014-02-15 MED ORDER — FUROSEMIDE 20 MG PO TABS
20.0000 mg | ORAL_TABLET | Freq: Every day | ORAL | Status: DC
Start: 2014-02-15 — End: 2014-05-09

## 2014-02-15 NOTE — Progress Notes (Signed)
Patient Care Team: [name], MD as PCP - General (Internal Medicine) [name], MD (Cardiology)   HPI  [name] [name] is a 80 y.o. female  Seen in followup for atrial fibrillation in the setting of modest nonobstructive coronary disease and normal left ventricular function. She is status post pacemaker implantation for sinus node dysfunction and posttermination pauses. She's been treated with flecainide and subsequently with Rythmol and most recently amiodarone.   She saw Dr. Rayann Heman to consider catheter ablation but at this juncture would like to continue taking amiodarone  She also has a thoracic aortic aneurysm followed by Dr. Servando Snare    When she was last seen in May 2014 the issue of edema with a differential of lymphedema as was in the dorsum of her foot. Her diuretics were changed from HCTZ--Lasix because of inadequate blood pressure control HCTZ was resumed.  Myoview scanning for atypical chest pain and dyspnea on exertion was undertaken 7/14 and was normal  She has a sensation that she gets chest discomfort when she tries to get walking too quickly. It is not present when she walks more slowly. At our last office visit we did a lot of walking to try to optimize rate response on her device.   Past Medical History  Diagnosis Date  . Persistent atrial fibrillation   . sinus node dysfunction//post termination pauses   . HTN (hypertension)   . CAD (coronary artery disease)     LHC 5/08:  pOM 20%, pRCA 20-30%, EF 60%  . NASH (nonalcoholic steatohepatitis)   . PMR (polymyalgia rheumatica)     no steriods for 5 years  . HLD (hyperlipidemia)   . Neuromuscular disorder   . Hepatitis     hx of medication induced hepatitis (Vytorin per pt)  . Ascending aorta enlargement     4.8 cm per echo 08/07/11; 5.0 by CT in Jan 2013  . Pacemaker-Medtronic 11/12/2011    Implanted 2013   . Hx of echocardiogram     echo 1/13: EF 55%, Gr 2 diast dysfn, Asc Ao aneurysm 4.8 cm, mild  MR, mod LAE, mild RAE    Past Surgical History  Procedure Laterality Date  . Umbilical hernia repair  1950's  . Incision and drainage breast abscess  1973  . Cardiac catheterization    . Hernia repair    . Pacemaker insertion  Jan 2013  . Cardioversion  03/02/2012    Procedure: CARDIOVERSION;  Surgeon: Lelon Perla, MD;  Location: [facility] ENDOSCOPY;  Service: Cardiovascular;  Laterality: N/A;  . Cardioversion  03/13/2012    Procedure: CARDIOVERSION;  Surgeon: Josue Hector, MD;  Location: [facility] ENDOSCOPY;  Service: Cardiovascular;  Laterality: N/A;  . Cardioversion  04/15/2012    Procedure: CARDIOVERSION;  Surgeon: [name], MD;  Location: [facility] ENDOSCOPY;  Service: Cardiovascular;  Laterality: N/A;    Current Outpatient Prescriptions  Medication Sig Dispense Refill  . amiodarone (PACERONE) 200 MG tablet Take one tablet (235m total) by mouth 5 days per week  20 tablet  11  . Calcium Carbonate-Vitamin D (CALCIUM + D) 600-200 MG-UNIT TABS Take 1 tablet by mouth 2 (two) times daily.       . cetirizine (ZYRTEC) 10 MG tablet Take 10 mg by mouth as needed.       . Cholecalciferol (VITAMIN D3) 1000 UNITS tablet Take 1,000 Units by mouth daily.        . hydrochlorothiazide (HYDRODIURIL) 25 MG tablet Take 1 tablet (25 mg  total) by mouth daily.  90 tablet  3  . multivitamin (THERAGRAN) per tablet Take 1 tablet by mouth daily.        . nadolol (CORGARD) 40 MG tablet Take three tablets by mouth every evening  90 tablet  6  . potassium chloride SA (K-DUR,KLOR-CON) 20 MEQ tablet 2 tablets daily and 3 tablets every other day  75 tablet  6  . valsartan (DIOVAN) 320 MG tablet Take 1 tablet (320 mg total) by mouth daily.  30 tablet  11  . warfarin (COUMADIN) 2.5 MG tablet Take as directed by coumadin clinic  40 tablet  3  . WELCHOL 625 MG tablet Take 625 mg by mouth 2 (two) times daily with a meal. Every other day take 3 tabs.       No current facility-administered medications for this visit.     Allergies  Allergen Reactions  . Penicillins     REACTION: anaphylaxis  . Codeine     REACTION: feel funny  . Statins     REACTION: elevated LFT's  . Tylenol [Acetaminophen] Other (See Comments)    If taken with Vytorin at risk for liver damage    Review of Systems negative except from HPI and PMH  Physical Exam BP 148/81  Pulse 84  Ht 5' 6"  (1.676 m)  Wt 188 lb (85.276 kg)  BMI 30.36 kg/m2 Well developed and nourished in no acute distress HENT normal Neck supple with JVP 8-10 Clear Regular rate and rhythm, no murmurs or gallops Abd-soft with active BS No Clubbing cyanosis  R>L edema.; nonpitting and involving primarily the forefoot Skin-warm and dry A & Oriented  Grossly normal sensory and motor function ry   thisECG demonstrates atrial pacing at 84 Intervals-/12/41  Assessment and  Plan  Edema  Atrial fibrillaton  Sinus node dysfunction   Hypertension  Chest discomfort  Pacemaker  Pt with edema that while asymmetric did improve somewhat with diuretics previously and then recurred. She now has evidence of JVD.  The edema pattern is not strongly supportive however, of cardiogenic edema. However, given the constellation above, will undertake a diuretic trial . Last to follow with her PCP about this next week or 2. Metabolic profile be necessary to check her potassium.  Blood pressure is reasonably controlled.  We'll also check amiodarone surveillance labs  -Atypicalthis discomfort is noted with positive exercise. We discussed treatment device threshold. He is inclined to leave it alone for now.

## 2014-02-15 NOTE — Patient Instructions (Addendum)
Your physician has recommended you make the following change in your medication:  1) STOP Hydrochlorothiazide 2) START Furosemide 20 mg daily  Please have CMET and TSH in 10 days when you follow up with Dr. [name] -- (please change appointment out to 10-14 days with Dr. Maudie Mercury)  Remote monitoring is used to monitor your Pacemaker of ICD from home. This monitoring reduces the number of office visits required to check your device to one time per year. It allows [place] to keep an eye on the functioning of your device to ensure it is working properly. You are scheduled for a device check from home on 05/19/14. You may send your transmission at any time that day. If you have a wireless device, the transmission will be sent automatically. After your physician reviews your transmission, you will receive a postcard with your next transmission date.  Your physician wants you to follow-up in: 1 year with Dr. Caryl Comes.  You will receive a reminder letter in the mail two months in advance. If you don't receive a letter, please call our office to schedule the follow-up appointment.

## 2014-02-21 ENCOUNTER — Encounter: Payer: Self-pay | Admitting: Internal Medicine

## 2014-02-21 ENCOUNTER — Ambulatory Visit (INDEPENDENT_AMBULATORY_CARE_PROVIDER_SITE_OTHER): Payer: Medicare Other | Admitting: Pharmacist

## 2014-02-21 DIAGNOSIS — I4891 Unspecified atrial fibrillation: Secondary | ICD-10-CM

## 2014-02-21 DIAGNOSIS — Z5181 Encounter for therapeutic drug level monitoring: Secondary | ICD-10-CM

## 2014-02-21 DIAGNOSIS — Z7901 Long term (current) use of anticoagulants: Secondary | ICD-10-CM

## 2014-02-21 LAB — POCT INR: INR: 3

## 2014-02-23 ENCOUNTER — Ambulatory Visit
Admission: RE | Admit: 2014-02-23 | Discharge: 2014-02-23 | Disposition: A | Discharge status: Home / Self Care | Payer: Medicare Other | Source: Ambulatory Visit

## 2014-02-23 DIAGNOSIS — Z1231 Encounter for screening mammogram for malignant neoplasm of breast: Secondary | ICD-10-CM

## 2014-03-03 ENCOUNTER — Telehealth: Payer: Self-pay | Admitting: Internal Medicine

## 2014-03-03 ENCOUNTER — Other Ambulatory Visit: Payer: Self-pay | Admitting: *Deleted

## 2014-03-03 ENCOUNTER — Encounter: Payer: Self-pay | Admitting: Internal Medicine

## 2014-03-03 MED ORDER — VALSARTAN 320 MG PO TABS: 320.0000 mg | ORAL_TABLET | Freq: Every day | ORAL | Status: DC

## 2014-03-03 MED ORDER — AMIODARONE HCL 200 MG PO TABS: ORAL_TABLET | ORAL | Status: DC

## 2014-03-03 NOTE — Telephone Encounter (Deleted)
Error

## 2014-03-07 ENCOUNTER — Ambulatory Visit (INDEPENDENT_AMBULATORY_CARE_PROVIDER_SITE_OTHER): Payer: Medicare Other | Admitting: *Deleted

## 2014-03-07 DIAGNOSIS — Z5181 Encounter for therapeutic drug level monitoring: Secondary | ICD-10-CM

## 2014-03-07 DIAGNOSIS — I4891 Unspecified atrial fibrillation: Secondary | ICD-10-CM

## 2014-03-07 DIAGNOSIS — Z7901 Long term (current) use of anticoagulants: Secondary | ICD-10-CM

## 2014-03-07 LAB — POCT INR: INR: 2.5

## 2014-03-07 NOTE — Telephone Encounter (Signed)
Calling stating she is taking Lasix 20 mg daily for swelling in ankles and feet but doesn't seem to be helping.  States her feet and ankles stay swollen.  She keeps them elevated as much as she can and watches her salt intake. No SOB. Also states her abdomen seems to be swollen -clothes feel tighter around waist. She is taking KCL 20 meq 2 tabs daily and every other day 3 tabs.  She just wants to know if she needs another medication or increase Lasix. Advised Dr. Caryl Comes not in office today but will forward message to him for further recommendations regarding swelling. She understands and will wait for someone to call her back.

## 2014-03-07 NOTE — Telephone Encounter (Signed)
New Message  Pt called states that her feet are really puffy and the lasix isn't helping.. Requests a call back to determine if there is alternative medication that she can take to get the swelling down.

## 2014-03-08 ENCOUNTER — Other Ambulatory Visit: Payer: Self-pay

## 2014-03-08 MED ORDER — AMIODARONE HCL 200 MG PO TABS: ORAL_TABLET | ORAL | Status: DC

## 2014-03-09 ENCOUNTER — Other Ambulatory Visit: Payer: Self-pay | Admitting: *Deleted

## 2014-03-09 DIAGNOSIS — R609 Edema, unspecified: Secondary | ICD-10-CM

## 2014-03-09 DIAGNOSIS — Z79899 Other long term (current) drug therapy: Secondary | ICD-10-CM

## 2014-03-11 NOTE — Telephone Encounter (Addendum)
Late Entry: Spoke with paitent 03/09/14. Per Dr.Klein increase Lasix 40 mg BID for 3 days, then 40 mg daily for 7 days, then return to normal dosing.Take one extra K tablet while taking increased dosage of Lasix. Recent K level on 7/2 was 4.1. Patient will follow up with a BMET on 9/8. She will follow up with PCP in 2-3 weeks about this. She is agreeable to this.

## 2014-03-13 ENCOUNTER — Encounter: Payer: Medicare Other | Admitting: Internal Medicine

## 2014-03-22 ENCOUNTER — Other Ambulatory Visit: Payer: Medicare Other

## 2014-03-23 ENCOUNTER — Other Ambulatory Visit (INDEPENDENT_AMBULATORY_CARE_PROVIDER_SITE_OTHER): Payer: Medicare Other

## 2014-03-23 DIAGNOSIS — Z79899 Other long term (current) drug therapy: Secondary | ICD-10-CM

## 2014-03-23 DIAGNOSIS — R609 Edema, unspecified: Secondary | ICD-10-CM

## 2014-03-23 LAB — BASIC METABOLIC PANEL
BUN: 19 mg/dL (ref 6–23)
CHLORIDE: 108 meq/L (ref 96–112)
CO2: 25 meq/L (ref 19–32)
Calcium: 9.2 mg/dL (ref 8.4–10.5)
Creatinine, Ser: 1 mg/dL (ref 0.4–1.2)
GFR: 59.32 mL/min — ABNORMAL LOW (ref >60.00)
Glucose, Bld: 82 mg/dL (ref 70–99)
Potassium: 4.2 mEq/L (ref 3.5–5.1)
Sodium: 142 mEq/L (ref 135–145)

## 2014-03-25 ENCOUNTER — Telehealth: Payer: Self-pay | Admitting: *Deleted

## 2014-03-25 ENCOUNTER — Other Ambulatory Visit: Payer: Self-pay

## 2014-03-25 MED ORDER — POTASSIUM CHLORIDE CRYS ER 20 MEQ PO TBCR: EXTENDED_RELEASE_TABLET | ORAL | Status: DC

## 2014-03-25 NOTE — Telephone Encounter (Signed)
Called received from pt stating she has been in bathroom for about 30 minutes and is having rectal bleeding states she has hemorrhoids and the bleeding is dark red and has seen small clot Pt instructed to call her PCP and noitfy him and to call us back as we will check her INR today as well and she states understanding.

## 2014-03-25 NOTE — Telephone Encounter (Signed)
Spoke with pt to see how she was feeling and she states she did get bleeding stopped and did have supp that had been ordered earlier for hemorrhoids and she states she is going to use them and  states is not having any more bleeding   States she did not want to come today to have her INR checked Instructed to call again if needed

## 2014-04-04 ENCOUNTER — Ambulatory Visit (INDEPENDENT_AMBULATORY_CARE_PROVIDER_SITE_OTHER): Payer: Medicare Other | Admitting: *Deleted

## 2014-04-04 DIAGNOSIS — Z7901 Long term (current) use of anticoagulants: Secondary | ICD-10-CM

## 2014-04-04 DIAGNOSIS — I4891 Unspecified atrial fibrillation: Secondary | ICD-10-CM

## 2014-04-04 DIAGNOSIS — Z5181 Encounter for therapeutic drug level monitoring: Secondary | ICD-10-CM

## 2014-04-04 LAB — POCT INR: INR: 2.6

## 2014-05-09 ENCOUNTER — Ambulatory Visit (INDEPENDENT_AMBULATORY_CARE_PROVIDER_SITE_OTHER): Payer: Medicare Other | Admitting: Pharmacist

## 2014-05-09 ENCOUNTER — Other Ambulatory Visit: Payer: Self-pay | Admitting: Internal Medicine

## 2014-05-09 DIAGNOSIS — I4891 Unspecified atrial fibrillation: Secondary | ICD-10-CM

## 2014-05-09 DIAGNOSIS — Z5181 Encounter for therapeutic drug level monitoring: Secondary | ICD-10-CM

## 2014-05-09 DIAGNOSIS — Z7901 Long term (current) use of anticoagulants: Secondary | ICD-10-CM

## 2014-05-09 LAB — POCT INR: INR: 2.8

## 2014-05-19 ENCOUNTER — Ambulatory Visit (INDEPENDENT_AMBULATORY_CARE_PROVIDER_SITE_OTHER): Payer: Medicare Other | Admitting: *Deleted

## 2014-05-19 DIAGNOSIS — I495 Sick sinus syndrome: Secondary | ICD-10-CM

## 2014-05-19 LAB — MDC_IDC_ENUM_SESS_TYPE_REMOTE
Battery Remaining Longevity: 120 mo
Brady Statistic AP VS Percent: 98.9 %
Brady Statistic AS VP Percent: 0.1 %
Lead Channel Impedance Value: 432 Ohm
Lead Channel Impedance Value: 825 Ohm
Lead Channel Pacing Threshold Pulse Width: 0.4 ms
Lead Channel Setting Pacing Amplitude: 2.5 V
Lead Channel Setting Sensing Sensitivity: 5.6 mV
MDC IDC MSMT BATTERY VOLTAGE: 2.79 V
MDC IDC MSMT LEADCHNL RA PACING THRESHOLD AMPLITUDE: 0.625 V
MDC IDC MSMT LEADCHNL RV PACING THRESHOLD AMPLITUDE: 0.5 V
MDC IDC MSMT LEADCHNL RV PACING THRESHOLD PULSEWIDTH: 0.4 ms
MDC IDC MSMT LEADCHNL RV SENSING INTR AMPL: 16 mV
MDC IDC SET LEADCHNL RA PACING AMPLITUDE: 2 V
MDC IDC SET LEADCHNL RV PACING PULSEWIDTH: 0.4 ms
MDC IDC STAT BRADY AP VP PERCENT: 0.5 %
MDC IDC STAT BRADY AS VS PERCENT: 0.6 %

## 2014-05-19 NOTE — Progress Notes (Signed)
Remote pacemaker transmission.   

## 2014-05-30 ENCOUNTER — Other Ambulatory Visit: Payer: Self-pay | Admitting: *Deleted

## 2014-05-30 MED ORDER — WARFARIN SODIUM 2.5 MG PO TABS: ORAL_TABLET | ORAL | Status: DC

## 2014-06-06 ENCOUNTER — Encounter: Payer: Self-pay | Admitting: *Deleted

## 2014-06-06 ENCOUNTER — Encounter: Payer: Self-pay | Admitting: Internal Medicine

## 2014-06-06 ENCOUNTER — Ambulatory Visit (INDEPENDENT_AMBULATORY_CARE_PROVIDER_SITE_OTHER): Payer: Medicare Other | Admitting: Internal Medicine

## 2014-06-06 ENCOUNTER — Ambulatory Visit (INDEPENDENT_AMBULATORY_CARE_PROVIDER_SITE_OTHER): Payer: Medicare Other

## 2014-06-06 VITALS — BP 142/78 | HR 85 | Ht 66.0 in | Wt 182.0 lb

## 2014-06-06 DIAGNOSIS — I481 Persistent atrial fibrillation: Secondary | ICD-10-CM

## 2014-06-06 DIAGNOSIS — Z7901 Long term (current) use of anticoagulants: Secondary | ICD-10-CM

## 2014-06-06 DIAGNOSIS — Z01812 Encounter for preprocedural laboratory examination: Secondary | ICD-10-CM

## 2014-06-06 DIAGNOSIS — I495 Sick sinus syndrome: Secondary | ICD-10-CM

## 2014-06-06 DIAGNOSIS — I5032 Chronic diastolic (congestive) heart failure: Secondary | ICD-10-CM

## 2014-06-06 DIAGNOSIS — I4891 Unspecified atrial fibrillation: Secondary | ICD-10-CM

## 2014-06-06 DIAGNOSIS — Z5181 Encounter for therapeutic drug level monitoring: Secondary | ICD-10-CM

## 2014-06-06 LAB — MDC_IDC_ENUM_SESS_TYPE_INCLINIC
Battery Remaining Longevity: 125 mo
Battery Voltage: 2.79 V
Brady Statistic AP VP Percent: 0 %
Brady Statistic AP VS Percent: 98 %
Brady Statistic AS VP Percent: 0 %
Brady Statistic AS VS Percent: 2 %
Lead Channel Impedance Value: 828 Ohm
Lead Channel Sensing Intrinsic Amplitude: 4 mV
Lead Channel Setting Pacing Amplitude: 2 V
Lead Channel Setting Pacing Amplitude: 2.5 V
Lead Channel Setting Pacing Pulse Width: 0.4 ms
MDC IDC MSMT BATTERY IMPEDANCE: 162 Ohm
MDC IDC MSMT LEADCHNL RA IMPEDANCE VALUE: 441 Ohm
MDC IDC SESS DTM: 20151123120000
MDC IDC SET LEADCHNL RV SENSING SENSITIVITY: 5.6 mV

## 2014-06-06 LAB — CBC WITH DIFFERENTIAL/PLATELET
BASOS PCT: 0.5 % (ref 0.0–3.0)
Basophils Absolute: 0 10*3/uL (ref 0.0–0.1)
EOS PCT: 4.8 % (ref 0.0–5.0)
Eosinophils Absolute: 0.3 10*3/uL (ref 0.0–0.7)
HEMATOCRIT: 42.4 % (ref 36.0–46.0)
HEMOGLOBIN: 13.8 g/dL (ref 12.0–15.0)
LYMPHS ABS: 1.4 10*3/uL (ref 0.7–4.0)
Lymphocytes Relative: 24.3 % (ref 12.0–46.0)
MCHC: 32.6 g/dL (ref 30.0–36.0)
MCV: 96.8 fl (ref 78.0–100.0)
MONO ABS: 0.5 10*3/uL (ref 0.1–1.0)
Monocytes Relative: 9.1 % (ref 3.0–12.0)
NEUTROS ABS: 3.5 10*3/uL (ref 1.4–7.7)
Neutrophils Relative %: 61.3 % (ref 43.0–77.0)
Platelets: 210 10*3/uL (ref 150.0–400.0)
RBC: 4.38 Mil/uL (ref 3.87–5.11)
RDW: 15.8 % — ABNORMAL HIGH (ref 11.5–15.5)
WBC: 5.6 10*3/uL (ref 4.0–10.5)

## 2014-06-06 LAB — PROTIME-INR
INR: 3.6 ratio — ABNORMAL HIGH (ref 0.8–1.0)
Prothrombin Time: 38.1 s — ABNORMAL HIGH (ref 9.6–13.1)

## 2014-06-06 LAB — BASIC METABOLIC PANEL
BUN: 16 mg/dL (ref 6–23)
CHLORIDE: 106 meq/L (ref 96–112)
CO2: 25 mEq/L (ref 19–32)
Calcium: 9.4 mg/dL (ref 8.4–10.5)
Creatinine, Ser: 1 mg/dL (ref 0.4–1.2)
GFR: 60.01 mL/min (ref >60.00)
GLUCOSE: 106 mg/dL — AB (ref 70–99)
POTASSIUM: 4.2 meq/L (ref 3.5–5.1)
SODIUM: 139 meq/L (ref 135–145)

## 2014-06-06 LAB — POCT INR: INR: 3.8

## 2014-06-06 NOTE — Patient Instructions (Signed)
Your physician recommends that you continue on your current medications as directed. Please refer to the Current Medication list given to you today.  Pre-procedure labs today: BMET, CBCD, INR  Your physician has recommended that you have a Cardioversion (DCCV). Electrical Cardioversion uses a jolt of electricity to your heart either through paddles or wired patches attached to your chest. This is a controlled, usually prescheduled, procedure. Defibrillation is done under light anesthesia in the hospital, and you usually go home the day of the procedure. This is done to get your heart back into a normal rhythm. You are not awake for the procedure. Please see the instruction sheet given to you today.

## 2014-06-06 NOTE — Progress Notes (Signed)
Patient Care Team: [name], MD as PCP - General (Internal Medicine) [name], MD (Cardiology)   HPI  [name] [name] is a 80 y.o. female  Seen in followup for atrial fibrillation in the setting of modest nonobstructive coronary disease and normal left ventricular function. She is status post pacemaker implantation for sinus node dysfunction and posttermination pauses. She's been treated with flecainide and subsequently with Rythmol and most recently amiodarone.    she developed symptoms of exercise intolerance and shortness of breath on Thursday ; taking her pulse is relatively regular. She became aware that this might be atrial fibrillation. She came to Coumadin clinic today ECG verified in fact that she is in atrial fibrillation  She saw Dr. Rayann Heman to consider catheter ablation but at that juncture wanted to continue taking amiodarone  She also has a thoracic aortic aneurysm followed by Dr. Servando Snare    Myoview scanning for atypical chest pain and dyspnea on exertion was undertaken 7/14 and was normal      Past Medical History  Diagnosis Date  . Persistent atrial fibrillation   . sinus node dysfunction//post termination pauses   . HTN (hypertension)   . CAD (coronary artery disease)     LHC 5/08:  pOM 20%, pRCA 20-30%, EF 60%  . NASH (nonalcoholic steatohepatitis)   . PMR (polymyalgia rheumatica)     no steriods for 5 years  . HLD (hyperlipidemia)   . Neuromuscular disorder   . Hepatitis     hx of medication induced hepatitis (Vytorin per pt)  . Ascending aorta enlargement     4.8 cm per echo 08/07/11; 5.0 by CT in Jan 2013  . Pacemaker-Medtronic 11/12/2011    Implanted 2013   . Hx of echocardiogram     echo 1/13: EF 55%, Gr 2 diast dysfn, Asc Ao aneurysm 4.8 cm, mild MR, mod LAE, mild RAE    Past Surgical History  Procedure Laterality Date  . Umbilical hernia repair  1950's  . Incision and drainage breast abscess  1973  . Cardiac catheterization      . Hernia repair    . Pacemaker insertion  Jan 2013  . Cardioversion  03/02/2012    Procedure: CARDIOVERSION;  Surgeon: Lelon Perla, MD;  Location: [facility] ENDOSCOPY;  Service: Cardiovascular;  Laterality: N/A;  . Cardioversion  03/13/2012    Procedure: CARDIOVERSION;  Surgeon: Josue Hector, MD;  Location: [facility] ENDOSCOPY;  Service: Cardiovascular;  Laterality: N/A;  . Cardioversion  04/15/2012    Procedure: CARDIOVERSION;  Surgeon: [name], MD;  Location: [facility] ENDOSCOPY;  Service: Cardiovascular;  Laterality: N/A;    Current Outpatient Prescriptions  Medication Sig Dispense Refill  . amiodarone (PACERONE) 200 MG tablet Take one tablet (230m total) by mouth 5 days per week 30 tablet 10  . Calcium Carbonate-Vitamin D (CALCIUM + D) 600-200 MG-UNIT TABS Take 1 tablet by mouth 2 (two) times daily.     . cetirizine (ZYRTEC) 10 MG tablet Take 10 mg by mouth as needed.     . Cholecalciferol (VITAMIN D3) 1000 UNITS tablet Take 1,000 Units by mouth daily.      . furosemide (LASIX) 20 MG tablet TAKE 1 TABLET BY MOUTH DAILY 30 tablet 12  . multivitamin (THERAGRAN) per tablet Take 1 tablet by mouth daily.      . nadolol (CORGARD) 40 MG tablet Take three tablets by mouth every evening 90 tablet 6  . potassium chloride SA (K-DUR,KLOR-CON) 20 MEQ tablet 2  tablets daily and 3 tablets every other day 75 tablet 6  . valsartan (DIOVAN) 320 MG tablet Take 1 tablet (320 mg total) by mouth daily. 30 tablet 11  . warfarin (COUMADIN) 2.5 MG tablet Take as directed by coumadin clinic 40 tablet 3  . WELCHOL 625 MG tablet Take 625 mg by mouth 3 (three) times daily.      No current facility-administered medications for this visit.    Allergies  Allergen Reactions  . Penicillins Anaphylaxis    REACTION: anaphylaxis  . Statins Other (See Comments)    REACTION: elevated LFT's  . Tylenol [Acetaminophen] Other (See Comments)    If taken with Vytorin at risk for liver damage  . Codeine Other (See Comments)     REACTION: feel funny    Review of Systems negative except from HPI and PMH  Physical Exam BP 142/78 mmHg  Pulse 85  Ht 5' 6"  (1.676 m)  Wt 82.555 kg (182 lb)  BMI 29.39 kg/m2 Well developed and nourished in no acute distress HENT normal Neck supple with JVP 8-10 Clear Irregularly irregular rate and rhythm, no murmurs or gallops Abd-soft with active BS No Clubbing cyanosis  R>L edema.; nonpitting and involving primarily the forefoot Skin-warm and dry A & Oriented  Grossly normal sensory and motor function ry   thisECG demonstrates atrial pacing at 84 Intervals-/12/41  Assessment and  Plan  Edema  Atrial fibrillaton persistent  Sinus node dysfunction   Hypertension  Pacemaker The patient's device was interrogated.  The information was reviewed. No changes were made in the programming.      She has developed symptomatic atrial fibrillation   We will plan to  [facility] cardioversion. We have discussed the potential for reversion  Hopefully this will be infrequent. We will maintain her amiodarone dosing as it currently is.   blood pressure is modestly elevated; if this persists , especially given the evidence of volume overload,  Adding a little bit HCT to her ARB in conjunction with her Lasix) appropriate.     Her INR today is 3.8

## 2014-06-07 ENCOUNTER — Ambulatory Visit (HOSPITAL_COMMUNITY)
Admission: RE | Admit: 2014-06-07 | Discharge: 2014-06-07 | Disposition: A | Discharge status: Home / Self Care | Payer: Medicare Other | Source: Ambulatory Visit | Attending: Internal Medicine | Admitting: Internal Medicine

## 2014-06-07 ENCOUNTER — Encounter (HOSPITAL_COMMUNITY)
Admission: RE | Disposition: A | Discharge status: Home / Self Care | Payer: Medicare Other | Source: Ambulatory Visit | Attending: Internal Medicine

## 2014-06-07 ENCOUNTER — Ambulatory Visit (HOSPITAL_COMMUNITY): Payer: Medicare Other | Admitting: Anesthesiology

## 2014-06-07 ENCOUNTER — Encounter (HOSPITAL_COMMUNITY): Payer: Self-pay | Admitting: Internal Medicine

## 2014-06-07 DIAGNOSIS — I712 Thoracic aortic aneurysm, without rupture: Secondary | ICD-10-CM | POA: Insufficient documentation

## 2014-06-07 DIAGNOSIS — E785 Hyperlipidemia, unspecified: Secondary | ICD-10-CM | POA: Insufficient documentation

## 2014-06-07 DIAGNOSIS — I480 Paroxysmal atrial fibrillation: Secondary | ICD-10-CM

## 2014-06-07 DIAGNOSIS — I493 Ventricular premature depolarization: Secondary | ICD-10-CM | POA: Diagnosis not present

## 2014-06-07 DIAGNOSIS — I2510 Atherosclerotic heart disease of native coronary artery without angina pectoris: Secondary | ICD-10-CM | POA: Diagnosis not present

## 2014-06-07 DIAGNOSIS — I10 Essential (primary) hypertension: Secondary | ICD-10-CM | POA: Diagnosis not present

## 2014-06-07 DIAGNOSIS — Z87891 Personal history of nicotine dependence: Secondary | ICD-10-CM | POA: Diagnosis not present

## 2014-06-07 DIAGNOSIS — I4891 Unspecified atrial fibrillation: Secondary | ICD-10-CM | POA: Insufficient documentation

## 2014-06-07 DIAGNOSIS — I5032 Chronic diastolic (congestive) heart failure: Secondary | ICD-10-CM

## 2014-06-07 HISTORY — PX: CARDIOVERSION: SHX1299

## 2014-06-07 SURGERY — CARDIOVERSION
Anesthesia: General

## 2014-06-07 MED ORDER — LIDOCAINE HCL (CARDIAC) 20 MG/ML IV SOLN
INTRAVENOUS | Status: DC | PRN
  Administered 2014-06-07: 60 mg via INTRAVENOUS

## 2014-06-07 MED ORDER — PROPOFOL 10 MG/ML IV BOLUS
INTRAVENOUS | Status: DC | PRN
  Administered 2014-06-07: 20 mg via INTRAVENOUS
  Administered 2014-06-07: 40 mg via INTRAVENOUS

## 2014-06-07 MED ORDER — SODIUM CHLORIDE 0.9 % IV SOLN
INTRAVENOUS | Status: DC
Start: 2014-06-07 — End: 2014-06-07
  Administered 2014-06-07: 500 mL via INTRAVENOUS

## 2014-06-07 NOTE — Transfer of Care (Signed)
Immediate Anesthesia Transfer of Care Note  Patient: [name] [name]  Procedure(s) Performed: Procedure(s): CARDIOVERSION (N/A)  Patient Location: Endoscopy Unit  Anesthesia Type:MAC  Level of Consciousness: awake  Airway & Oxygen Therapy: Patient Spontanous Breathing  Post-op Assessment: Report given to PACU RN  Post vital signs: Reviewed and stable  Complications: No apparent anesthesia complications

## 2014-06-07 NOTE — Discharge Instructions (Addendum)
Electrical Cardioversion, Care After Refer to this sheet in the next few weeks. These instructions provide you with information on caring for yourself after your procedure. Your health care provider may also give you more specific instructions. Your treatment has been planned according to current medical practices, but problems sometimes occur. Call your health care provider if you have any problems or questions after your procedure. WHAT TO EXPECT AFTER THE PROCEDURE After your procedure, it is typical to have the following sensations:  Some redness on the skin where the shocks were delivered. If this is tender, a sunburn lotion or hydrocortisone cream may help.  Possible return of an abnormal heart rhythm within hours or days after the procedure. HOME CARE INSTRUCTIONS  Take medicines only as directed by your health care provider. Be sure you understand how and when to take your medicine.  Learn how to feel your pulse and check it often.  Limit your activity for 48 hours after the procedure or as directed by your health care provider.  Avoid or minimize caffeine and other stimulants as directed by your health care provider. SEEK MEDICAL CARE IF:  You feel like your heart is beating too fast or your pulse is not regular.  You have any questions about your medicines.  You have bleeding that will not stop. SEEK IMMEDIATE MEDICAL CARE IF:  You are dizzy or feel faint.  It is hard to breathe or you feel short of breath.  There is a change in discomfort in your chest.  Your speech is slurred or you have trouble moving an arm or leg on one side of your body.  You get a serious muscle cramp that does not go away.  Your fingers or toes turn cold or blue. Document Released: 04/21/2013 Document Revised: 11/15/2013 Document Reviewed: 04/21/2013 [facility] Patient Information 2015 [place], [place]. This information is not intended to replace advice given to you by your health care provider.  Make sure you discuss any questions you have with your health care provider.   Monitored Anesthesia Care Monitored anesthesia care is an anesthesia service for a medical procedure. Anesthesia is the loss of the ability to feel pain. It is produced by medicines called anesthetics. It may affect a small area of your body (local anesthesia), a large area of your body (regional anesthesia), or your entire body (general anesthesia). The need for monitored anesthesia care depends your procedure, your condition, and the potential need for regional or general anesthesia. It is often provided during procedures where:   General anesthesia may be needed if there are complications. This is because you need special care when you are under general anesthesia.   You will be under local or regional anesthesia. This is so that you are able to have higher levels of anesthesia if needed.   You will receive calming medicines (sedatives). This is especially the case if sedatives are given to put you in a semi-conscious state of relaxation (deep sedation). This is because the amount of sedative needed to produce this state can be hard to predict. Too much of a sedative can produce general anesthesia. Monitored anesthesia care is performed by one or more health care providers who have special training in all types of anesthesia. You will need to meet with these health care providers before your procedure. During this meeting, they will ask you about your medical history. They will also give you instructions to follow. (For example, you will need to stop eating and drinking before your procedure.  You may also need to stop or change medicines you are taking.) During your procedure, your health care providers will stay with you. They will:   Watch your condition. This includes watching your blood pressure, breathing, and level of pain.   Diagnose and treat problems that occur.   Give medicines if they are needed. These  may include calming medicines (sedatives) and anesthetics.   Make sure you are comfortable.  Having monitored anesthesia care does not necessarily mean that you will be under anesthesia. It does mean that your health care providers will be able to manage anesthesia if you need it or if it occurs. It also means that you will be able to have a different type of anesthesia than you are having if you need it. When your procedure is complete, your health care providers will continue to watch your condition. They will make sure any medicines wear off before you are allowed to go home.  Document Released: 03/27/2005 Document Revised: 11/15/2013 Document Reviewed: 08/12/2012 [facility] Patient Information 2015 [place], [place]. This information is not intended to replace advice given to you by your health care provider. Make sure you discuss any questions you have with your health care provider.

## 2014-06-07 NOTE — H&P (Signed)
     INTERVAL PROCEDURE H&P  History and Physical Interval Note:  06/07/2014 1:34 PM  [name] [name] has presented today for their planned procedure. The various methods of treatment have been discussed with the patient and family. After consideration of risks, benefits and other options for treatment, the patient has consented to the procedure.  The patients' outpatient history has been reviewed, patient examined, and no change in status from most recent office note within the past 30 days. I have reviewed the patients' chart and labs and will proceed as planned. Questions were answered to the patient's satisfaction.   [name] Casino, MD, [facility] Attending Cardiologist [facility]  [name] [name] 06/07/2014, 1:34 PM

## 2014-06-07 NOTE — CV Procedure (Signed)
    CARDIOVERSION NOTE  Procedure: Electrical Cardioversion Indications:  Atrial Fibrillation  Procedure Details:  Consent: Risks of procedure as well as the alternatives and risks of each were explained to the (patient/caregiver).  Consent for procedure obtained.  Time Out: Verified patient identification, verified procedure, site/side was marked, verified correct patient position, special equipment/implants available, medications/allergies/relevent history reviewed, required imaging and test results available.  Performed  Patient placed on cardiac monitor, pulse oximetry, supplemental oxygen as necessary.  Sedation given: Propofol per anesthesia Pacer pads placed anterior and posterior chest.  Cardioverted 2 time(s).  Cardioverted at 150J and 200J biphasic.  Impression: Findings: Post procedure EKG shows: NSR Complications: None Patient did tolerate procedure well.  Plan: 1. Follow-up with Dr. Caryl Comes.  Time Spent Directly with the Patient:  30 minutes   [name], MD, [facility] Attending Cardiologist [facility]  Abrey Bradway C 06/07/2014, 1:57 PM

## 2014-06-07 NOTE — Anesthesia Preprocedure Evaluation (Addendum)
Anesthesia Evaluation  Patient identified by MRN, date of birth, ID band Patient awake    Reviewed: Allergy & Precautions, H&P , NPO status , Patient's Chart, lab work & pertinent test results  Airway Mallampati: II  TM Distance: >3 FB Neck ROM: Full    Dental  (+) Teeth Intact, Dental Advisory Given   Pulmonary former smoker,          Cardiovascular hypertension, + CAD and + Peripheral Vascular Disease + dysrhythmias Atrial Fibrillation + pacemaker  EF 55%. Ascending aortic aneurysm.   Neuro/Psych negative neurological ROS     GI/Hepatic negative GI ROS, (+) Hepatitis - ([facility])  Endo/Other  negative endocrine ROS  Renal/GU negative Renal ROS     Musculoskeletal negative musculoskeletal ROS (+)   Abdominal   Peds  Hematology negative hematology ROS (+)   Anesthesia Other Findings   Reproductive/Obstetrics                            Anesthesia Physical Anesthesia Plan  ASA: III  Anesthesia Plan: General   Post-op Pain Management:    Induction: Intravenous  Airway Management Planned: Mask  Additional Equipment:   Intra-op Plan:   Post-operative Plan:   Informed Consent: I have reviewed the patients History and Physical, chart, labs and discussed the procedure including the risks, benefits and alternatives for the proposed anesthesia with the patient or authorized representative who has indicated his/her understanding and acceptance.     Plan Discussed with: CRNA and Surgeon  Anesthesia Plan Comments:         Anesthesia Quick Evaluation

## 2014-06-07 NOTE — Anesthesia Postprocedure Evaluation (Signed)
  Anesthesia Post-op Note  Patient: [name] [name]  Procedure(s) Performed: Procedure(s): CARDIOVERSION (N/A)  Patient Location: Endoscopy Unit  Anesthesia Type:MAC  Level of Consciousness: awake and oriented  Airway and Oxygen Therapy: Patient Spontanous Breathing  Post-op Pain: none  Post-op Assessment: Post-op Vital signs reviewed, Patient's Cardiovascular Status Stable and Respiratory Function Stable  Post-op Vital Signs: Reviewed and stable  Last Vitals:  Filed Vitals:   06/07/14 1217  BP: 144/77  Pulse: 81  Temp: 36.4 C  Resp: 20    Complications: No apparent anesthesia complications

## 2014-06-08 ENCOUNTER — Encounter: Payer: Self-pay | Admitting: Internal Medicine

## 2014-06-08 ENCOUNTER — Encounter (HOSPITAL_COMMUNITY): Payer: Self-pay | Admitting: Internal Medicine

## 2014-06-13 ENCOUNTER — Telehealth: Payer: Self-pay | Admitting: Internal Medicine

## 2014-06-13 NOTE — Telephone Encounter (Signed)
New problem    Pt stated she had a cardioversion last week and need to speak to nurse concerning when she need to come in.

## 2014-06-14 NOTE — Telephone Encounter (Signed)
Left message explaining I would review with Dr. Caryl Comes and scheduler would call her to make follow up appointment.

## 2014-06-15 ENCOUNTER — Encounter: Payer: Self-pay | Admitting: Cardiology

## 2014-06-20 ENCOUNTER — Encounter: Payer: Self-pay | Admitting: Internal Medicine

## 2014-06-21 ENCOUNTER — Ambulatory Visit (INDEPENDENT_AMBULATORY_CARE_PROVIDER_SITE_OTHER): Payer: Medicare Other

## 2014-06-21 ENCOUNTER — Ambulatory Visit (INDEPENDENT_AMBULATORY_CARE_PROVIDER_SITE_OTHER): Payer: Medicare Other | Admitting: Internal Medicine

## 2014-06-21 ENCOUNTER — Encounter: Payer: Self-pay | Admitting: Internal Medicine

## 2014-06-21 VITALS — BP 152/90 | HR 70 | Ht 66.5 in | Wt 182.0 lb

## 2014-06-21 DIAGNOSIS — I4891 Unspecified atrial fibrillation: Secondary | ICD-10-CM

## 2014-06-21 DIAGNOSIS — I4819 Other persistent atrial fibrillation: Secondary | ICD-10-CM

## 2014-06-21 DIAGNOSIS — I481 Persistent atrial fibrillation: Secondary | ICD-10-CM

## 2014-06-21 DIAGNOSIS — Z7901 Long term (current) use of anticoagulants: Secondary | ICD-10-CM

## 2014-06-21 DIAGNOSIS — Z5181 Encounter for therapeutic drug level monitoring: Secondary | ICD-10-CM

## 2014-06-21 LAB — HEPATIC FUNCTION PANEL
ALT: 20 U/L (ref 0–35)
AST: 23 U/L (ref 0–37)
Albumin: 3.6 g/dL (ref 3.5–5.2)
Alkaline Phosphatase: 66 U/L (ref 39–117)
BILIRUBIN TOTAL: 0.6 mg/dL (ref 0.2–1.2)
Bilirubin, Direct: 0.1 mg/dL (ref 0.0–0.3)
Total Protein: 7.3 g/dL (ref 6.0–8.3)

## 2014-06-21 LAB — MDC_IDC_ENUM_SESS_TYPE_INCLINIC
Battery Impedance: 186 Ohm
Battery Remaining Longevity: 121 mo
Brady Statistic AP VP Percent: 1 %
Brady Statistic AS VS Percent: 0 %
Lead Channel Impedance Value: 436 Ohm
Lead Channel Impedance Value: 824 Ohm
Lead Channel Pacing Threshold Pulse Width: 0.4 ms
Lead Channel Sensing Intrinsic Amplitude: 4 mV
Lead Channel Setting Pacing Amplitude: 2 V
Lead Channel Setting Pacing Amplitude: 2.5 V
Lead Channel Setting Pacing Pulse Width: 0.4 ms
Lead Channel Setting Sensing Sensitivity: 5.6 mV
MDC IDC MSMT BATTERY VOLTAGE: 2.79 V
MDC IDC MSMT LEADCHNL RA PACING THRESHOLD AMPLITUDE: 0.5 V
MDC IDC MSMT LEADCHNL RA PACING THRESHOLD PULSEWIDTH: 0.4 ms
MDC IDC MSMT LEADCHNL RV PACING THRESHOLD AMPLITUDE: 0.5 V
MDC IDC MSMT LEADCHNL RV SENSING INTR AMPL: 22.4 mV
MDC IDC SESS DTM: 20151208184759
MDC IDC STAT BRADY AP VS PERCENT: 99 %
MDC IDC STAT BRADY AS VP PERCENT: 0 %

## 2014-06-21 LAB — POCT INR: INR: 2.6

## 2014-06-21 LAB — TSH: TSH: 1.82 u[IU]/mL (ref 0.35–4.50)

## 2014-06-21 MED ORDER — FUROSEMIDE 20 MG PO TABS: 40.0000 mg | ORAL_TABLET | ORAL | Status: DC

## 2014-06-21 NOTE — Progress Notes (Signed)
Patient Care Team: [name], MD as PCP - General (Internal Medicine) [name], MD (Cardiology)   HPI  [name] [name] is a 80 y.o. female  Seen in followup for atrial fibrillation in the setting of modest nonobstructive coronary disease and normal left ventricular function. She is status post pacemaker implantation for sinus node dysfunction and posttermination pauses. She's been treated with flecainide and subsequently with Rythmol and most recently amiodarone.    she developed symptoms of exercise intolerance and shortness of breath on Thursday   ECG verified in fact that she is in atrial fibrillation;  she was cardioverted 11/24. She has felt considerably better since then. She does have some complaints about the procedure with a little bit of residual fall as well as a burn.  She has persistent edema She saw Dr. Rayann Heman to consider catheter ablation but at that juncture wanted to continue taking amiodarone.   She also has a thoracic aortic aneurysm followed by Dr. Servando Snare    Myoview scanning for atypical chest pain and dyspnea on exertion was undertaken 7/14 and was normal      Past Medical History  Diagnosis Date  . Persistent atrial fibrillation   . sinus node dysfunction//post termination pauses   . HTN (hypertension)   . CAD (coronary artery disease)     LHC 5/08:  pOM 20%, pRCA 20-30%, EF 60%  . NASH (nonalcoholic steatohepatitis)   . PMR (polymyalgia rheumatica)     no steriods for 5 years  . HLD (hyperlipidemia)   . Neuromuscular disorder   . Hepatitis     hx of medication induced hepatitis (Vytorin per pt)  . Ascending aorta enlargement     4.8 cm per echo 08/07/11; 5.0 by CT in Jan 2013  . Pacemaker-Medtronic 11/12/2011    Implanted 2013   . Hx of echocardiogram     echo 1/13: EF 55%, Gr 2 diast dysfn, Asc Ao aneurysm 4.8 cm, mild MR, mod LAE, mild RAE    Past Surgical History  Procedure Laterality Date  . Umbilical hernia repair  1950's  .  Incision and drainage breast abscess  1973  . Cardiac catheterization    . Hernia repair    . Pacemaker insertion  Jan 2013  . Cardioversion  03/02/2012    Procedure: CARDIOVERSION;  Surgeon: Lelon Perla, MD;  Location: [facility] ENDOSCOPY;  Service: Cardiovascular;  Laterality: N/A;  . Cardioversion  03/13/2012    Procedure: CARDIOVERSION;  Surgeon: Josue Hector, MD;  Location: [facility] ENDOSCOPY;  Service: Cardiovascular;  Laterality: N/A;  . Cardioversion  04/15/2012    Procedure: CARDIOVERSION;  Surgeon: [name], MD;  Location: [place];  Service: Cardiovascular;  Laterality: N/A;  . Cardioversion N/A 06/07/2014    Procedure: CARDIOVERSION;  Surgeon: [name], MD;  Location: [facility] ENDOSCOPY;  Service: Cardiovascular;  Laterality: N/A;    Current Outpatient Prescriptions  Medication Sig Dispense Refill  . amiodarone (PACERONE) 200 MG tablet Take 200 mg by mouth daily.    Marland Kitchen amLODipine (NORVASC) 5 MG tablet Take 5 mg by mouth daily.    . Calcium Carbonate-Vitamin D (CALCIUM + D) 600-200 MG-UNIT TABS Take 1 tablet by mouth 2 (two) times daily.     . cetirizine (ZYRTEC) 10 MG tablet Take 10 mg by mouth as needed for allergies.     . Cholecalciferol (VITAMIN D3) 1000 UNITS tablet Take 1,000 Units by mouth daily.      . furosemide (LASIX) 20 MG  tablet TAKE 1 TABLET BY MOUTH DAILY 30 tablet 12  . multivitamin (THERAGRAN) per tablet Take 1 tablet by mouth daily.      . nadolol (CORGARD) 40 MG tablet Take three tablets by mouth every evening 90 tablet 6  . nadolol (CORGARD) 40 MG tablet Take 120 mg by mouth every evening.    . potassium chloride SA (K-DUR,KLOR-CON) 20 MEQ tablet Take 40-60 mEq by mouth daily. Take 40 mg daily and 60 mg every other day    . valsartan (DIOVAN) 320 MG tablet Take 1 tablet (320 mg total) by mouth daily. 30 tablet 11  . warfarin (COUMADIN) 2.5 MG tablet Take as directed by coumadin clinic 40 tablet 3  . WELCHOL 625 MG tablet Take 625-1,250 mg by mouth 2  (two) times daily. Take 625 mg every morning and 1250 mg at bedtime.     No current facility-administered medications for this visit.    Allergies  Allergen Reactions  . Penicillins Anaphylaxis    REACTION: anaphylaxis  . Statins Other (See Comments)    REACTION: elevated LFT's  . Tylenol [Acetaminophen] Other (See Comments)    If taken with Vytorin at risk for liver damage  . Codeine Other (See Comments)    REACTION: feel funny    Review of Systems negative except from HPI and PMH  Physical Exam BP 152/90 mmHg  Pulse 70  Ht 5' 6.5" (1.689 m)  Wt 182 lb (82.555 kg)  BMI 28.94 kg/m2 Well developed and nourished in no acute distress HENT normal Neck supple with JVP 8  Clear Irregularly irregular rate and rhythm, no murmurs or gallops Abd-soft with active BS No Clubbing cyanosis  R>L edema.; nonpitting and involving primarily the forefoot Skin-warm and dry A & Oriented  Grossly normal sensory and motor function ry   thisECG demonstrates atrial pacing at 84 Intervals-/12/41  Assessment and  Plan  Edema  Atrial fibrillaton paroxysmal   Sinus node dysfunction   Hypertension  Gait instability  High risk medication surveillance  Pacemaker The patient's device was interrogated.  The information was reviewed. No changes were made in the programming.      Her blood pressure remains elevated. Because of that I'm reluctant to discontinue her amlodipine although it may be contributing to her edema. Alternatives that I would consider would be clonidine and hydralazine.  We have discussed atrial fibrillation at some length and the amiodarone with its potential for side effects.  We're concerned that amiodarone may be contributing to neurotoxicity manifested by a tremor and gait instability. This makes her less inclined to continue with amiodarone and more interested in considering ablation. We spent a fair amount of time discussing the risks of the procedure and the minimal  risk contributory risk related to anesthesia itself.  For now she would like to continue on amiodarone. We will actually increase it from 5 days--7 days per week because of the interval episodes of atrial fibrillation. We will be very attentive to whether there is worsening neurotoxicity. We'll check other  surveillance laboratories today.  We will plan to regroup in about 3 months and she will discuss in the interval with her family the possibility of pursuing catheter ablation

## 2014-06-21 NOTE — Patient Instructions (Addendum)
Your physician has recommended you make the following change in your medication:  1) INCREASE Amiodarone to 200 mg daily 2) CHANGE/INCREASE Furosemide to 40 mg every other day  Labs today: TSH, Hepatic function panel  Remote monitoring is used to monitor your Pacemaker of ICD from home. This monitoring reduces the number of office visits required to check your device to one time per year. It allows [place] to keep an eye on the functioning of your device to ensure it is working properly. You are scheduled for a device check from home on 09/20/14. You may send your transmission at any time that day. If you have a wireless device, the transmission will be sent automatically. After your physician reviews your transmission, you will receive a postcard with your next transmission date.  Your physician wants you to follow-up in: 3 months with Dr. Caryl Comes. You will receive a reminder letter in the mail two months in advance. If you don't receive a letter, please call our office to schedule the follow-up appointment.

## 2014-06-21 NOTE — Telephone Encounter (Signed)
Pt scheduled for today at 1:30 pm with Dr. Caryl Comes.

## 2014-06-23 ENCOUNTER — Encounter (HOSPITAL_COMMUNITY): Payer: Self-pay | Admitting: Internal Medicine

## 2014-06-27 ENCOUNTER — Encounter: Payer: Self-pay | Admitting: Internal Medicine

## 2014-06-29 ENCOUNTER — Encounter: Payer: Self-pay | Admitting: Internal Medicine

## 2014-06-29 NOTE — Telephone Encounter (Signed)
This encounter was created in error - please disregard.

## 2014-06-29 NOTE — Telephone Encounter (Signed)
New Msg      Pt returning call please contact at [phone].

## 2014-07-05 ENCOUNTER — Other Ambulatory Visit: Payer: Self-pay

## 2014-07-05 MED ORDER — AMIODARONE HCL 200 MG PO TABS: 200.0000 mg | ORAL_TABLET | Freq: Every day | ORAL | Status: DC

## 2014-07-14 ENCOUNTER — Ambulatory Visit (INDEPENDENT_AMBULATORY_CARE_PROVIDER_SITE_OTHER): Payer: Medicare Other | Admitting: Pharmacist Clinician (PhC)/ Clinical Pharmacy Specialist

## 2014-07-14 DIAGNOSIS — Z7901 Long term (current) use of anticoagulants: Secondary | ICD-10-CM

## 2014-07-14 DIAGNOSIS — Z5181 Encounter for therapeutic drug level monitoring: Secondary | ICD-10-CM

## 2014-07-14 DIAGNOSIS — I4819 Other persistent atrial fibrillation: Secondary | ICD-10-CM

## 2014-07-14 DIAGNOSIS — I4891 Unspecified atrial fibrillation: Secondary | ICD-10-CM

## 2014-07-14 DIAGNOSIS — I481 Persistent atrial fibrillation: Secondary | ICD-10-CM

## 2014-07-14 LAB — POCT INR: INR: 2.4

## 2014-08-10 ENCOUNTER — Ambulatory Visit (INDEPENDENT_AMBULATORY_CARE_PROVIDER_SITE_OTHER): Payer: Medicare Other | Admitting: *Deleted

## 2014-08-10 DIAGNOSIS — I481 Persistent atrial fibrillation: Secondary | ICD-10-CM

## 2014-08-10 DIAGNOSIS — I4819 Other persistent atrial fibrillation: Secondary | ICD-10-CM

## 2014-08-10 DIAGNOSIS — Z7901 Long term (current) use of anticoagulants: Secondary | ICD-10-CM

## 2014-08-10 DIAGNOSIS — I4891 Unspecified atrial fibrillation: Secondary | ICD-10-CM

## 2014-08-10 DIAGNOSIS — Z5181 Encounter for therapeutic drug level monitoring: Secondary | ICD-10-CM

## 2014-08-10 LAB — POCT INR: INR: 2.3

## 2014-08-11 ENCOUNTER — Telehealth: Payer: Self-pay | Admitting: Internal Medicine

## 2014-08-11 NOTE — Telephone Encounter (Signed)
New message    Patient calling    Patient C/O Palpitations:  High priority if patient C/O lightheadedness and shortness of breath.  1. How long have you been having palpitations? No palpitations  - patient stating she having  Afib since yesterday morning about 7 am    2. Are you currently experiencing lightheadedness and shortness of breath? Sob when moving around   3. Have you checked your BP and heart rate? (document readings)  No reading this am    4. Are you experiencing any other symptoms?  No

## 2014-08-11 NOTE — Telephone Encounter (Signed)
Patient has been in rhythm since increasing Amiodarone to daily in December. She states she has been out of rhythm since yesterday, experiencing the usual SOB when moving around. She would like to stay on Amiodarone as long as the medication will work. She further explains that "if ablation only has a 50-60% success rate she does not want to go through that".  She states "this is not a big enough success rate for her to go through procedure, recovery, and possible repeat if AFib re-occurs". (she and her family have discussed this at length) Patient aware that I will call her with Dr. Olin Pia recommendations.

## 2014-08-12 NOTE — Telephone Encounter (Signed)
Spoke with pt we will increase amiodarone from 200qd --400 mg twice daily   We'll anticipate cardioversion at the end of next week with resumption of amiodarone initially at 400 mg a day for a couple of weeks and then drop it back down to 200 mg a day and see if she does. In the event that she reversed relatively early we will consider adjunctive use of ranolazine

## 2014-08-15 ENCOUNTER — Other Ambulatory Visit (INDEPENDENT_AMBULATORY_CARE_PROVIDER_SITE_OTHER): Payer: Medicare Other | Admitting: *Deleted

## 2014-08-15 ENCOUNTER — Other Ambulatory Visit: Payer: Self-pay | Admitting: *Deleted

## 2014-08-15 ENCOUNTER — Encounter: Payer: Self-pay | Admitting: *Deleted

## 2014-08-15 ENCOUNTER — Encounter (HOSPITAL_COMMUNITY): Payer: Self-pay | Admitting: Pharmacy Technician

## 2014-08-15 DIAGNOSIS — Z01812 Encounter for preprocedural laboratory examination: Secondary | ICD-10-CM

## 2014-08-15 DIAGNOSIS — I480 Paroxysmal atrial fibrillation: Secondary | ICD-10-CM

## 2014-08-15 LAB — CBC WITH DIFFERENTIAL/PLATELET
Basophils Absolute: 0 10*3/uL (ref 0.0–0.1)
Basophils Relative: 0.3 % (ref 0.0–3.0)
EOS ABS: 0.4 10*3/uL (ref 0.0–0.7)
EOS PCT: 5.8 % — AB (ref 0.0–5.0)
HEMATOCRIT: 41.2 % (ref 36.0–46.0)
HEMOGLOBIN: 13.8 g/dL (ref 12.0–15.0)
LYMPHS ABS: 1.2 10*3/uL (ref 0.7–4.0)
Lymphocytes Relative: 18 % (ref 12.0–46.0)
MCHC: 33.5 g/dL (ref 30.0–36.0)
MCV: 95 fl (ref 78.0–100.0)
Monocytes Absolute: 0.7 10*3/uL (ref 0.1–1.0)
Monocytes Relative: 10.7 % (ref 3.0–12.0)
NEUTROS PCT: 65.2 % (ref 43.0–77.0)
Neutro Abs: 4.2 10*3/uL (ref 1.4–7.7)
Platelets: 235 10*3/uL (ref 150.0–400.0)
RBC: 4.34 Mil/uL (ref 3.87–5.11)
RDW: 15.2 % (ref 11.5–15.5)
WBC: 6.5 10*3/uL (ref 4.0–10.5)

## 2014-08-15 LAB — BASIC METABOLIC PANEL
BUN: 17 mg/dL (ref 6–23)
CALCIUM: 9.2 mg/dL (ref 8.4–10.5)
CO2: 28 meq/L (ref 19–32)
Chloride: 108 mEq/L (ref 96–112)
Creatinine, Ser: 0.92 mg/dL (ref 0.40–1.20)
GFR: 62.24 mL/min (ref >60.00)
Glucose, Bld: 95 mg/dL (ref 70–99)
Potassium: 3.8 mEq/L (ref 3.5–5.1)
Sodium: 141 mEq/L (ref 135–145)

## 2014-08-15 LAB — PROTIME-INR
INR: 3.3 ratio — ABNORMAL HIGH (ref 0.8–1.0)
Prothrombin Time: 35.4 s — ABNORMAL HIGH (ref 9.6–13.1)

## 2014-08-15 MED ORDER — AMIODARONE HCL 200 MG PO TABS: ORAL_TABLET | ORAL | Status: DC

## 2014-08-15 NOTE — Telephone Encounter (Signed)
Scheduled DCCV 2/4. Letter of instructions reviewed with patient and left at front desk for her pick up.  Pre procedure labs today. Patient verbalized understanding and agreeable to plan.

## 2014-08-15 NOTE — Addendum Note (Signed)
Addended by: Eulis Foster on: 08/15/2014 12:55 PM   Modules accepted: Orders

## 2014-08-18 ENCOUNTER — Ambulatory Visit (HOSPITAL_COMMUNITY): Payer: Medicare Other | Admitting: Anesthesiology

## 2014-08-18 ENCOUNTER — Encounter (HOSPITAL_COMMUNITY)
Admission: RE | Disposition: A | Discharge status: Home / Self Care | Payer: Self-pay | Source: Ambulatory Visit | Attending: Cardiology

## 2014-08-18 ENCOUNTER — Encounter (HOSPITAL_COMMUNITY): Payer: Self-pay | Admitting: Anesthesiology

## 2014-08-18 ENCOUNTER — Ambulatory Visit (HOSPITAL_COMMUNITY)
Admission: RE | Admit: 2014-08-18 | Discharge: 2014-08-18 | Disposition: A | Discharge status: Home / Self Care | Payer: Medicare Other | Source: Ambulatory Visit | Attending: Cardiology | Admitting: Cardiology

## 2014-08-18 DIAGNOSIS — M353 Polymyalgia rheumatica: Secondary | ICD-10-CM | POA: Diagnosis not present

## 2014-08-18 DIAGNOSIS — Z7901 Long term (current) use of anticoagulants: Secondary | ICD-10-CM | POA: Diagnosis not present

## 2014-08-18 DIAGNOSIS — Z950 Presence of cardiac pacemaker: Secondary | ICD-10-CM | POA: Insufficient documentation

## 2014-08-18 DIAGNOSIS — Z880 Allergy status to penicillin: Secondary | ICD-10-CM | POA: Diagnosis not present

## 2014-08-18 DIAGNOSIS — I739 Peripheral vascular disease, unspecified: Secondary | ICD-10-CM | POA: Diagnosis not present

## 2014-08-18 DIAGNOSIS — J449 Chronic obstructive pulmonary disease, unspecified: Secondary | ICD-10-CM | POA: Insufficient documentation

## 2014-08-18 DIAGNOSIS — Z888 Allergy status to other drugs, medicaments and biological substances status: Secondary | ICD-10-CM | POA: Insufficient documentation

## 2014-08-18 DIAGNOSIS — I10 Essential (primary) hypertension: Secondary | ICD-10-CM | POA: Insufficient documentation

## 2014-08-18 DIAGNOSIS — Z87891 Personal history of nicotine dependence: Secondary | ICD-10-CM | POA: Diagnosis not present

## 2014-08-18 DIAGNOSIS — I4891 Unspecified atrial fibrillation: Secondary | ICD-10-CM | POA: Insufficient documentation

## 2014-08-18 DIAGNOSIS — I2510 Atherosclerotic heart disease of native coronary artery without angina pectoris: Secondary | ICD-10-CM | POA: Diagnosis not present

## 2014-08-18 DIAGNOSIS — Z886 Allergy status to analgesic agent status: Secondary | ICD-10-CM | POA: Diagnosis not present

## 2014-08-18 DIAGNOSIS — E785 Hyperlipidemia, unspecified: Secondary | ICD-10-CM | POA: Insufficient documentation

## 2014-08-18 HISTORY — PX: CARDIOVERSION: SHX1299

## 2014-08-18 SURGERY — CARDIOVERSION
Anesthesia: General

## 2014-08-18 MED ORDER — SODIUM CHLORIDE 0.9 % IV SOLN
INTRAVENOUS | Status: DC | PRN
  Administered 2014-08-18: 13:00:00 via INTRAVENOUS

## 2014-08-18 MED ORDER — LIDOCAINE HCL (CARDIAC) 20 MG/ML IV SOLN
INTRAVENOUS | Status: DC | PRN
  Administered 2014-08-18: 20 mg via INTRAVENOUS

## 2014-08-18 MED ORDER — PROPOFOL 10 MG/ML IV BOLUS
INTRAVENOUS | Status: DC | PRN
  Administered 2014-08-18: 50 mg via INTRAVENOUS

## 2014-08-18 NOTE — H&P (Signed)
The patient presented for DCCV for atrial fibrillation.  The last office note from Dr Caryl Comes.  Progress Notes   [name] [name] (MR# [id number])      Progress Notes Info    Author Note Status Last Update User Last Update Date/Time   [name], MD Signed [name], MD 06/21/2014 2:19 PM    Progress Notes    Expand All Collapse All        Patient Care Team: [name], MD as PCP - General (Internal Medicine) [name], MD (Cardiology)   HPI  [name] [name] is a 81 y.o. female  Seen in followup for atrial fibrillation in the setting of modest nonobstructive coronary disease and normal left ventricular function. She is status post pacemaker implantation for sinus node dysfunction and posttermination pauses. She's been treated with flecainide and subsequently with Rythmol and most recently amiodarone.   she developed symptoms of exercise intolerance and shortness of breath on Thursday ECG verified in fact that she is in atrial fibrillation; she was cardioverted 11/24. She has felt considerably better since then. She does have some complaints about the procedure with a little bit of residual fall as well as a burn.  She has persistent edema She saw Dr. Rayann Heman to consider catheter ablation but at that juncture wanted to continue taking amiodarone.  She also has a thoracic aortic aneurysm followed by Dr. Servando Snare   Myoview scanning for atypical chest pain and dyspnea on exertion was undertaken 7/14 and was normal     Past Medical History  Diagnosis Date  . Persistent atrial fibrillation   . sinus node dysfunction//post termination pauses   . HTN (hypertension)   . CAD (coronary artery disease)     LHC 5/08: pOM 20%, pRCA 20-30%, EF 60%  . NASH (nonalcoholic steatohepatitis)   . PMR (polymyalgia rheumatica)     no steriods for 5 years  . HLD (hyperlipidemia)   . Neuromuscular disorder   . Hepatitis     hx  of medication induced hepatitis (Vytorin per pt)  . Ascending aorta enlargement     4.8 cm per echo 08/07/11; 5.0 by CT in Jan 2013  . Pacemaker-Medtronic 11/12/2011    Implanted 2013   . Hx of echocardiogram     echo 1/13: EF 55%, Gr 2 diast dysfn, Asc Ao aneurysm 4.8 cm, mild MR, mod LAE, mild RAE    Past Surgical History  Procedure Laterality Date  . Umbilical hernia repair  1950's  . Incision and drainage breast abscess  1973  . Cardiac catheterization    . Hernia repair    . Pacemaker insertion  Jan 2013  . Cardioversion  03/02/2012    Procedure: CARDIOVERSION; Surgeon: Lelon Perla, MD; Location: [facility] ENDOSCOPY; Service: Cardiovascular; Laterality: N/A;  . Cardioversion  03/13/2012    Procedure: CARDIOVERSION; Surgeon: Josue Hector, MD; Location: [facility] ENDOSCOPY; Service: Cardiovascular; Laterality: N/A;  . Cardioversion  04/15/2012    Procedure: CARDIOVERSION; Surgeon: [name], MD; Location: [place]; Service: Cardiovascular; Laterality: N/A;  . Cardioversion N/A 06/07/2014    Procedure: CARDIOVERSION; Surgeon: [name], MD; Location: [facility] ENDOSCOPY; Service: Cardiovascular; Laterality: N/A;    Current Outpatient Prescriptions  Medication Sig Dispense Refill  . amiodarone (PACERONE) 200 MG tablet Take 200 mg by mouth daily.    Marland Kitchen amLODipine (NORVASC) 5 MG tablet Take 5 mg by mouth daily.    . Calcium Carbonate-Vitamin D (CALCIUM + D) 600-200 MG-UNIT TABS Take 1 tablet by  mouth 2 (two) times daily.     . cetirizine (ZYRTEC) 10 MG tablet Take 10 mg by mouth as needed for allergies.     . Cholecalciferol (VITAMIN D3) 1000 UNITS tablet Take 1,000 Units by mouth daily.     . furosemide (LASIX) 20 MG tablet TAKE 1 TABLET BY MOUTH DAILY 30 tablet 12  . multivitamin (THERAGRAN) per tablet Take 1 tablet by mouth daily.     . nadolol  (CORGARD) 40 MG tablet Take three tablets by mouth every evening 90 tablet 6  . nadolol (CORGARD) 40 MG tablet Take 120 mg by mouth every evening.    . potassium chloride SA (K-DUR,KLOR-CON) 20 MEQ tablet Take 40-60 mEq by mouth daily. Take 40 mg daily and 60 mg every other day    . valsartan (DIOVAN) 320 MG tablet Take 1 tablet (320 mg total) by mouth daily. 30 tablet 11  . warfarin (COUMADIN) 2.5 MG tablet Take as directed by coumadin clinic 40 tablet 3  . WELCHOL 625 MG tablet Take 625-1,250 mg by mouth 2 (two) times daily. Take 625 mg every morning and 1250 mg at bedtime.     No current facility-administered medications for this visit.    Allergies  Allergen Reactions  . Penicillins Anaphylaxis    REACTION: anaphylaxis  . Statins Other (See Comments)    REACTION: elevated LFT's  . Tylenol [Acetaminophen] Other (See Comments)    If taken with Vytorin at risk for liver damage  . Codeine Other (See Comments)    REACTION: feel funny    Review of Systems negative except from HPI and PMH  Physical Exam BP 152/90 mmHg  Pulse 70  Ht 5' 6.5" (1.689 m)  Wt 182 lb (82.555 kg)  BMI 28.94 kg/m2 Well developed and nourished in no acute distress HENT normal Neck supple with JVP 8  Clear Irregularly irregular rate and rhythm, no murmurs or gallops Abd-soft with active BS No Clubbing cyanosis R>L edema.; nonpitting and involving primarily the forefoot Skin-warm and dry A & Oriented Grossly normal sensory and motor function ry  thisECG demonstrates atrial pacing at 84 Intervals-/12/41  Assessment and Plan  Edema  Atrial fibrillaton paroxysmal   Sinus node dysfunction   Hypertension  Gait instability  High risk medication surveillance  Pacemaker The patient's device was interrogated. The information was reviewed. No changes were made in the programming.    Her blood pressure remains elevated. Because of  that I'm reluctant to discontinue her amlodipine although it may be contributing to her edema. Alternatives that I would consider would be clonidine and hydralazine.  We have discussed atrial fibrillation at some length and the amiodarone with its potential for side effects. We're concerned that amiodarone may be contributing to neurotoxicity manifested by a tremor and gait instability. This makes her less inclined to continue with amiodarone and more interested in considering ablation. We spent a fair amount of time discussing the risks of the procedure and the minimal risk contributory risk related to anesthesia itself.  For now she would like to continue on amiodarone. We will actually increase it from 5 days--7 days per week because of the interval episodes of atrial fibrillation. We will be very attentive to whether there is worsening neurotoxicity. We'll check other surveillance laboratories today.  We will plan to regroup in about 3 months and she will discuss in the interval with her family the possibility of pursuing catheter ablation

## 2014-08-18 NOTE — CV Procedure (Signed)
    Cardioversion Note  [name] [name] [id number] 05/12/1931  Procedure: DC Cardioversion Indications: atrial fibrillation  Procedure Details Consent: Obtained Time Out: Verified patient identification, verified procedure, site/side was marked, verified correct patient position, special equipment/implants available, Radiology Safety Procedures followed,  medications/allergies/relevent history reviewed, required imaging and test results available.  Performed  The patient has been on adequate anticoagulation.  The patient received IV lidocain and Propofol for sedation.  Synchronous cardioversion was performed at 120 joules.  The cardioversion was successful.   Complications: No apparent complications Patient did tolerate procedure well.   [name] Spark, MD, [facility] 08/18/2014, 11:05 AM

## 2014-08-18 NOTE — Discharge Instructions (Signed)
Electrical Cardioversion Electrical cardioversion is the delivery of a jolt of electricity to change the rhythm of the heart. Sticky patches or metal paddles are placed on the chest to deliver the electricity from a device. This is done to restore a normal rhythm. A rhythm that is too fast or not regular keeps the heart from pumping well. Electrical cardioversion is done in an emergency if:   There is low or no blood pressure as a result of the heart rhythm.   Normal rhythm must be restored as fast as possible to protect the brain and heart from further damage.   It may save a life. Cardioversion may be done for heart rhythms that are not immediately life threatening, such as atrial fibrillation or flutter, in which:   The heart is beating too fast or is not regular.   Medicine to change the rhythm has not worked.   It is safe to wait in order to allow time for preparation.  Symptoms of the abnormal rhythm are bothersome.  The risk of stroke and other serious problems can be reduced. LET [facility] CARE PROVIDER KNOW ABOUT:   Any allergies you have.  All medicines you are taking, including vitamins, herbs, eye drops, creams, and over-the-counter medicines.  Previous problems you or members of your family have had with the use of anesthetics.   Any blood disorders you have.   Previous surgeries you have had.   Medical conditions you have. RISKS AND COMPLICATIONS  Generally, this is a safe procedure. However, problems can occur and include:   Breathing problems related to the anesthetic used.  A blood clot that breaks free and travels to other parts of your body. This could cause a stroke or other problems. The risk of this is lowered by use of blood-thinning medicine (anticoagulant) prior to the procedure.  Cardiac arrest (rare). BEFORE THE PROCEDURE   You may have tests to detect blood clots in your heart and to evaluate heart function.  You may start taking  anticoagulants so your blood does not clot as easily.   Medicines may be given to help stabilize your heart rate and rhythm. PROCEDURE  You will be given medicine through an IV tube to reduce discomfort and make you sleepy (sedative).   An electrical shock will be delivered. AFTER THE PROCEDURE Your heart rhythm will be watched to make sure it does not change.  Document Released: 06/21/2002 Document Revised: 11/15/2013 Document Reviewed: 01/13/2013 [facility] Patient Information 2015 [place], [place]. This information is not intended to replace advice given to you by your health care provider. Make sure you discuss any questions you have with your health care provider. Monitored Anesthesia Care Monitored anesthesia care is an anesthesia service for a medical procedure. Anesthesia is the loss of the ability to feel pain. It is produced by medicines called anesthetics. It may affect a small area of your body (local anesthesia), a large area of your body (regional anesthesia), or your entire body (general anesthesia). The need for monitored anesthesia care depends your procedure, your condition, and the potential need for regional or general anesthesia. It is often provided during procedures where:   General anesthesia may be needed if there are complications. This is because you need special care when you are under general anesthesia.   You will be under local or regional anesthesia. This is so that you are able to have higher levels of anesthesia if needed.   You will receive calming medicines (sedatives). This is especially  the case if sedatives are given to put you in a semi-conscious state of relaxation (deep sedation). This is because the amount of sedative needed to produce this state can be hard to predict. Too much of a sedative can produce general anesthesia. Monitored anesthesia care is performed by one or more health care providers who have special training in all types of anesthesia.  You will need to meet with these health care providers before your procedure. During this meeting, they will ask you about your medical history. They will also give you instructions to follow. (For example, you will need to stop eating and drinking before your procedure. You may also need to stop or change medicines you are taking.) During your procedure, your health care providers will stay with you. They will:   Watch your condition. This includes watching your blood pressure, breathing, and level of pain.   Diagnose and treat problems that occur.   Give medicines if they are needed. These may include calming medicines (sedatives) and anesthetics.   Make sure you are comfortable.  Having monitored anesthesia care does not necessarily mean that you will be under anesthesia. It does mean that your health care providers will be able to manage anesthesia if you need it or if it occurs. It also means that you will be able to have a different type of anesthesia than you are having if you need it. When your procedure is complete, your health care providers will continue to watch your condition. They will make sure any medicines wear off before you are allowed to go home.  Document Released: 03/27/2005 Document Revised: 11/15/2013 Document Reviewed: 08/12/2012 [facility] Patient Information 2015 [place], [place]. This information is not intended to replace advice given to you by your health care provider. Make sure you discuss any questions you have with your health care provider.   Electrical Cardioversion, Care After Refer to this sheet in the next few weeks. These instructions provide you with information on caring for yourself after your procedure. Your health care provider may also give you more specific instructions. Your treatment has been planned according to current medical practices, but problems sometimes occur. Call your health care provider if you have any problems or questions after your  procedure. WHAT TO EXPECT AFTER THE PROCEDURE After your procedure, it is typical to have the following sensations:  Some redness on the skin where the shocks were delivered. If this is tender, a sunburn lotion or hydrocortisone cream may help.  Possible return of an abnormal heart rhythm within hours or days after the procedure. HOME CARE INSTRUCTIONS  Take medicines only as directed by your health care provider. Be sure you understand how and when to take your medicine.  Learn how to feel your pulse and check it often.  Limit your activity for 48 hours after the procedure or as directed by your health care provider.  Avoid or minimize caffeine and other stimulants as directed by your health care provider. SEEK MEDICAL CARE IF:  You feel like your heart is beating too fast or your pulse is not regular.  You have any questions about your medicines.  You have bleeding that will not stop. SEEK IMMEDIATE MEDICAL CARE IF:  You are dizzy or feel faint.  It is hard to breathe or you feel short of breath.  There is a change in discomfort in your chest.  Your speech is slurred or you have trouble moving an arm or leg on one side of your body.  You get a serious muscle cramp that does not go away.  Your fingers or toes turn cold or blue. Document Released: 04/21/2013 Document Revised: 11/15/2013 Document Reviewed: 04/21/2013 [facility] Patient Information 2015 [place], [place]. This information is not intended to replace advice given to you by your health care provider. Make sure you discuss any questions you have with your health care provider.

## 2014-08-18 NOTE — Anesthesia Postprocedure Evaluation (Signed)
  Anesthesia Post-op Note  Patient: [name] [name]  Procedure(s) Performed: Procedure(s): CARDIOVERSION (N/A)  Patient Location: Endoscopy Unit  Anesthesia Type:General  Level of Consciousness: awake, alert  and oriented  Airway and Oxygen Therapy: Patient Spontanous Breathing and Patient connected to nasal cannula oxygen  Post-op Pain: none  Post-op Assessment: Post-op Vital signs reviewed, Patient's Cardiovascular Status Stable, Respiratory Function Stable, Patent Airway, No signs of Nausea or vomiting, Adequate PO intake, Pain level controlled and Pain level not controlled  Post-op Vital Signs: Reviewed and stable  Last Vitals:  Filed Vitals:   08/18/14 1327  BP: 165/83  Pulse: 76  Resp: 17    Complications: No apparent anesthesia complications

## 2014-08-18 NOTE — Anesthesia Preprocedure Evaluation (Addendum)
Anesthesia Evaluation  Patient identified by MRN, date of birth, ID band Patient awake    Reviewed: Allergy & Precautions, NPO status , Patient's Chart, lab work & pertinent test results, reviewed documented beta blocker date and time   History of Anesthesia Complications Negative for: history of anesthetic complications  Airway Mallampati: II  TM Distance: >3 FB Neck ROM: full    Dental  (+) Teeth Intact, Dental Advidsory Given   Pulmonary COPD COPD inhaler, former smoker (quit 1992),  breath sounds clear to auscultation        Cardiovascular hypertension, Pt. on medications and Pt. on home beta blockers + CAD (non-obstructive by cath '08) and + Peripheral Vascular Disease (asc aorta 4.8 cm per echo 08/07/11; 5.0 by CT in Jan 2013) + dysrhythmias Atrial Fibrillation + pacemaker Rhythm:Irregular Rate:Normal  '14 myoview: normal '13 ECHO: EF 55%, Gr 2 diast dysfn,    Neuro/Psych negative neurological ROS     GI/Hepatic negative GI ROS, (+) Hepatitis -, Unspecified  Endo/Other  Polymyalgia rheumatica  Renal/GU negative Renal ROS     Musculoskeletal   Abdominal   Peds  Hematology  (+) Blood dyscrasia (coumadin, INR 3.3), ,   Anesthesia Other Findings   Reproductive/Obstetrics                          Anesthesia Physical Anesthesia Plan  ASA: III  Anesthesia Plan: General   Post-op Pain Management:    Induction: Intravenous  Airway Management Planned: Mask and Natural Airway  Additional Equipment:   Intra-op Plan:   Post-operative Plan:   Informed Consent: I have reviewed the patients History and Physical, chart, labs and discussed the procedure including the risks, benefits and alternatives for the proposed anesthesia with the patient or authorized representative who has indicated his/her understanding and acceptance.   Dental Advisory Given  Plan Discussed with: Anesthesiologist,  CRNA and Surgeon  Anesthesia Plan Comments: (Plan routine monitors, GA for cardioversion)       Anesthesia Quick Evaluation

## 2014-08-18 NOTE — Transfer of Care (Signed)
Immediate Anesthesia Transfer of Care Note  Patient: [name] [name]  Procedure(s) Performed: Procedure(s): CARDIOVERSION (N/A)  Patient Location: Endoscopy Unit  Anesthesia Type:General  Level of Consciousness: awake, alert  and oriented  Airway & Oxygen Therapy: Patient Spontanous Breathing and Patient connected to nasal cannula oxygen  Post-op Assessment: Report given to RN, Post -op Vital signs reviewed and stable and Patient moving all extremities X 4  Post vital signs: Reviewed and stable  Last Vitals:  Filed Vitals:   08/18/14 1327  BP: 165/83  Pulse: 76  Resp: 17    Complications: No apparent anesthesia complications

## 2014-08-19 ENCOUNTER — Telehealth: Payer: Self-pay | Admitting: Internal Medicine

## 2014-08-19 ENCOUNTER — Encounter (HOSPITAL_COMMUNITY): Payer: Self-pay | Admitting: Cardiology

## 2014-08-19 NOTE — Telephone Encounter (Signed)
New Message  Cardioversion yesterday, experiencing bouts of A-fib, wants to know what to do going forward, wanted to leave message. Please call back and discus.   If # not working, cell is okay.

## 2014-08-19 NOTE — Telephone Encounter (Signed)
She tells me that she has never experienced AFib right after DCCV, and she has had previous DCCVs.   She is aware that Dr. Caryl Comes is out of the office today but that I will review this with him Monday. She and I discussed possibly adding another medication to see if this helps (Ranolazine). She and I will touch base Monday morning to see if she had any episodes over the weekend and then discuss plan with Dr. Caryl Comes. Patient verbalized understanding and agreeable to plan.

## 2014-08-22 ENCOUNTER — Telehealth: Payer: Self-pay | Admitting: Internal Medicine

## 2014-08-22 NOTE — Telephone Encounter (Signed)
Called to check to see how patient did over the weekend.  She tells me that she has been out all weekend.  She states that just when she thinks she is ok and back in rhythm, she gets up and can tell she is still out of rhythm.  I asked patient to send in transmission and we would review with Dr. Caryl Comes.  She is agreeable to this.

## 2014-08-22 NOTE — Telephone Encounter (Signed)
After reviewing transmission informed patient that she has not been out of rhythm.  She is very concerned about why she is feeling so bad. She can't do anything.   Patient advised to follow up with a PA/NP, per Dr. Caryl Comes if symptoms are distressing.  She would like to make an appt.   Notified that scheduler will call her to arrange appt.  She is thankful and hoping for next day or two.

## 2014-08-22 NOTE — Telephone Encounter (Signed)
Patient very worried about not feeling well.  She wonders if this is related to Amiodarone she is on. I explained that she may review this with Cecille Rubin, NP tomorrow at office visit.  Patient is fine with that.

## 2014-08-22 NOTE — Telephone Encounter (Signed)
New Message  Pt requested to speak w/ Rn. Please call back and discuss.

## 2014-08-23 ENCOUNTER — Ambulatory Visit (INDEPENDENT_AMBULATORY_CARE_PROVIDER_SITE_OTHER): Payer: Medicare Other | Admitting: *Deleted

## 2014-08-23 ENCOUNTER — Ambulatory Visit (INDEPENDENT_AMBULATORY_CARE_PROVIDER_SITE_OTHER): Payer: Medicare Other | Admitting: Nurse Practitioner

## 2014-08-23 ENCOUNTER — Encounter: Payer: Self-pay | Admitting: Nurse Practitioner

## 2014-08-23 VITALS — BP 158/90 | HR 66 | Ht 66.0 in | Wt 181.8 lb

## 2014-08-23 DIAGNOSIS — I4891 Unspecified atrial fibrillation: Secondary | ICD-10-CM

## 2014-08-23 DIAGNOSIS — Z7901 Long term (current) use of anticoagulants: Secondary | ICD-10-CM

## 2014-08-23 DIAGNOSIS — I4819 Other persistent atrial fibrillation: Secondary | ICD-10-CM

## 2014-08-23 DIAGNOSIS — Z5181 Encounter for therapeutic drug level monitoring: Secondary | ICD-10-CM

## 2014-08-23 DIAGNOSIS — I481 Persistent atrial fibrillation: Secondary | ICD-10-CM

## 2014-08-23 LAB — CBC
HCT: 41.1 % (ref 36.0–46.0)
Hemoglobin: 14.1 g/dL (ref 12.0–15.0)
MCHC: 34.2 g/dL (ref 30.0–36.0)
MCV: 93.4 fl (ref 78.0–100.0)
Platelets: 281 10*3/uL (ref 150.0–400.0)
RBC: 4.4 Mil/uL (ref 3.87–5.11)
RDW: 15.4 % (ref 11.5–15.5)
WBC: 6.1 10*3/uL (ref 4.0–10.5)

## 2014-08-23 LAB — HEPATIC FUNCTION PANEL
ALT: 20 U/L (ref 0–35)
AST: 17 U/L (ref 0–37)
Albumin: 3.6 g/dL (ref 3.5–5.2)
Alkaline Phosphatase: 70 U/L (ref 39–117)
Bilirubin, Direct: 0 mg/dL (ref 0.0–0.3)
Total Bilirubin: 0.5 mg/dL (ref 0.2–1.2)
Total Protein: 7 g/dL (ref 6.0–8.3)

## 2014-08-23 LAB — BASIC METABOLIC PANEL
BUN: 18 mg/dL (ref 6–23)
CO2: 30 mEq/L (ref 19–32)
Calcium: 9.7 mg/dL (ref 8.4–10.5)
Chloride: 107 mEq/L (ref 96–112)
Creatinine, Ser: 0.94 mg/dL (ref 0.40–1.20)
GFR: 60.71 mL/min (ref >60.00)
Glucose, Bld: 104 mg/dL — ABNORMAL HIGH (ref 70–99)
Potassium: 4.2 mEq/L (ref 3.5–5.1)
Sodium: 141 mEq/L (ref 135–145)

## 2014-08-23 LAB — POCT INR: INR: 3.8

## 2014-08-23 MED ORDER — AMIODARONE HCL 200 MG PO TABS: 200.0000 mg | ORAL_TABLET | Freq: Every day | ORAL | Status: DC

## 2014-08-23 NOTE — Progress Notes (Signed)
CARDIOLOGY OFFICE NOTE  Date:  08/23/2014    [name] [name] Date of Birth: [birth year] Medical Record #[id number]  PCP:  [name], MD  Cardiologist:  Caryl Comes  Chief Complaint  Patient presents with  . Atrial Fibrillation    s/p cardioversion - seen for Dr. Caryl Comes    History of Present Illness: [name] [name] is a 81 y.o. female who presents today for a post cardioversion visit. Seen for Dr. Caryl Comes. She has had PAF. She has modest nonobstructive CAD per remote cath from 2008 and is managed medically. Normal LV function. Last echo in 2013. Normal Myoview in 2014.  Has PPM in place and has been on Flecainide, Rythmol and most recently amiodarone for her AF. She has discussed possible ablation with Dr. Rayann Heman but has elected to stay on amiodarone. She has a known thoracic aortic aneurysm followed by EBG. Her other issues are as noted below.   Last seen here in December of 2015 by Dr. Caryl Comes. I saw her 3 years ago.   Referred for repeat cardioversion due to recurrent AF noted at the end of January - performed earlier this month - this was successful. She had previously been cardioverted back in November of 2015. Chart continues to note that she desires to continue with amiodarone therapy due to the success rate with ablation discussed with Dr. Rayann Heman.   Comes back today. Here with her husband. She is quite anxious. She does not feel well. She is more short of breath. Has more "shaking". Still with edema in her legs.  Appetite is "less". She is not nauseated. No cough. No fever or chills. Some "tinges" of chest pain that she felt right after cardioversion - felt to be more "surface".  Trying to restrict salt. Does not wear support stockings. Sits with her legs down most of the day. Says BP is better at home.    Past Medical History  Diagnosis Date  . Persistent atrial fibrillation   . sinus node dysfunction//post termination pauses   . HTN (hypertension)   . CAD (coronary artery  disease)     LHC 5/08:  pOM 20%, pRCA 20-30%, EF 60%  . NASH (nonalcoholic steatohepatitis)   . PMR (polymyalgia rheumatica)     no steriods for 5 years  . HLD (hyperlipidemia)   . Neuromuscular disorder   . Hepatitis     hx of medication induced hepatitis (Vytorin per pt)  . Ascending aorta enlargement     4.8 cm per echo 08/07/11; 5.0 by CT in Jan 2013  . Pacemaker-Medtronic 11/12/2011    Implanted 2013   . Hx of echocardiogram     echo 1/13: EF 55%, Gr 2 diast dysfn, Asc Ao aneurysm 4.8 cm, mild MR, mod LAE, mild RAE    Past Surgical History  Procedure Laterality Date  . Umbilical hernia repair  1950's  . Incision and drainage breast abscess  1973  . Cardiac catheterization    . Hernia repair    . Pacemaker insertion  Jan 2013  . Cardioversion  03/02/2012    Procedure: CARDIOVERSION;  Surgeon: Lelon Perla, MD;  Location: [facility] ENDOSCOPY;  Service: Cardiovascular;  Laterality: N/A;  . Cardioversion  03/13/2012    Procedure: CARDIOVERSION;  Surgeon: Josue Hector, MD;  Location: [facility] ENDOSCOPY;  Service: Cardiovascular;  Laterality: N/A;  . Cardioversion  04/15/2012    Procedure: CARDIOVERSION;  Surgeon: [name], MD;  Location: [place];  Service: Cardiovascular;  Laterality: N/A;  .  Cardioversion N/A 06/07/2014    Procedure: CARDIOVERSION;  Surgeon: [name], MD;  Location: [facility] ENDOSCOPY;  Service: Cardiovascular;  Laterality: N/A;  . Permanent pacemaker insertion N/A 07/29/2011    Procedure: PERMANENT PACEMAKER INSERTION;  Surgeon: [name], MD;  Location: [facility] CATH LAB;  Service: Cardiovascular;  Laterality: N/A;  . Cardioversion N/A 08/18/2014    Procedure: CARDIOVERSION;  Surgeon: [name], MD;  Location: [facility] ENDOSCOPY;  Service: Cardiovascular;  Laterality: N/A;     Medications: Current Outpatient Prescriptions  Medication Sig Dispense Refill  . amiodarone (PACERONE) 200 MG tablet Take 1 tablet (200 mg total) by mouth daily. 30 tablet 6  .  amLODipine (NORVASC) 2.5 MG tablet Take 2.5 mg by mouth daily.     . Calcium Carbonate-Vitamin D (CALTRATE 600+D PO) Take 1 tablet by mouth 2 (two) times daily.    . cetirizine (ZYRTEC) 10 MG tablet Take 10 mg by mouth as needed for allergies.     . Cholecalciferol (VITAMIN D3) 1000 UNITS tablet Take 1,000 Units by mouth daily.      . diclofenac sodium (VOLTAREN) 1 % GEL Apply 2 g topically daily as needed (for pain).    . furosemide (LASIX) 20 MG tablet Take 2 tablets (40 mg total) by mouth every other day. (Patient taking differently: Take 20 mg by mouth daily. ) 30 tablet 12  . ipratropium (ATROVENT) 0.03 % nasal spray Place 2 sprays into both nostrils daily as needed for rhinitis.    . multivitamin (THERAGRAN) per tablet Take 1 tablet by mouth daily.      . nadolol (CORGARD) 40 MG tablet Take three tablets by mouth every evening (Patient taking differently: Take 120 mg by mouth daily. ) 90 tablet 6  . potassium chloride SA (K-DUR,KLOR-CON) 20 MEQ tablet Take 40 mEq by mouth daily.     . valsartan (DIOVAN) 320 MG tablet Take 1 tablet (320 mg total) by mouth daily. 30 tablet 11  . warfarin (COUMADIN) 2.5 MG tablet Take as directed by coumadin clinic (Patient taking differently: Take 1.25-2.5 mg by mouth daily. Take 1.53m by mouth on Monday, Wednesday, Friday and Sunday. Take 2.573mby mouth on all other days) 40 tablet 3  . WELCHOL 625 MG tablet Take 625-1,250 mg by mouth 2 (two) times daily. Take 625 mg every morning and 1250 mg at bedtime.     No current facility-administered medications for this visit.    Allergies: Allergies  Allergen Reactions  . Penicillins Anaphylaxis  . Statins Other (See Comments)    REACTION: elevated LFT's  . Tylenol [Acetaminophen] Other (See Comments)    If taken with Vytorin at risk for liver damage  . Codeine Other (See Comments)    REACTION: feel funny    Social History: The patient  reports that she quit smoking about 24 years ago. Her smoking use  included Cigarettes. She has never used smokeless tobacco. She reports that she does not drink alcohol or use illicit drugs.   Family History: The patient's family history includes Alzheimer's disease in an other family member; Coronary artery disease in an other family member.   Review of Systems: Please see the history of present illness.   Otherwise, the review of systems is positive for easy bruising.   All other systems are reviewed and negative.   Physical Exam: VS:  BP 158/90 mmHg  Pulse 66  Ht 5' 6"  (1.676 m)  Wt 181 lb 12.8 oz (82.464 kg)  BMI 29.36 kg/m2  SpO2 98% .  BMI Body mass index is 29.36 kg/(m^2).  Wt Readings from Last 3 Encounters:  08/23/14 181 lb 12.8 oz (82.464 kg)  08/18/14 180 lb (81.647 kg)  06/21/14 182 lb (82.555 kg)    General: Pleasant. Well developed, well nourished and in no acute distress. She is obese.  HEENT: Normal. Neck: Supple, no JVD, carotid bruits, or masses noted.  Cardiac: Regular rate and rhythm. No murmurs, rubs, or gallops. No edema.  Respiratory:  Lungs are clear to auscultation bilaterally with normal work of breathing.  GI: Soft and nontender.  MS: No deformity or atrophy. Gait and ROM intact. Skin: Warm and dry. Color is normal.  Neuro:  Strength and sensation are intact and no gross focal deficits noted.  Psych: Alert, appropriate and with normal affect.   LABORATORY DATA:  EKG:  EKG is ordered today. This demonstrates what looks to be underlying sinus with A pacing.  I have interrogated her PPM - no spells noted of AF.  Lab Results  Component Value Date   WBC 6.5 08/15/2014   HGB 13.8 08/15/2014   HCT 41.2 08/15/2014   PLT 235.0 08/15/2014   GLUCOSE 95 08/15/2014   ALT 20 06/21/2014   AST 23 06/21/2014   NA 141 08/15/2014   K 3.8 08/15/2014   CL 108 08/15/2014   CREATININE 0.92 08/15/2014   BUN 17 08/15/2014   CO2 28 08/15/2014   TSH 1.82 06/21/2014   INR 3.3* 08/15/2014    BNP (last 3 results) No results  for input(s): BNP in the last 8760 hours.  ProBNP (last 3 results) No results for input(s): PROBNP in the last 8760 hours.   Lab Results  Component Value Date   INR 3.3* 08/15/2014   INR 2.3 08/10/2014   INR 2.4 07/14/2014   PROTIME 17.3 01/02/2009    Other Studies Reviewed Today:  Procedure: DC Cardioversion Indications: atrial fibrillation  Procedure Details Consent: Obtained Time Out: Verified patient identification, verified procedure, site/side was marked, verified correct patient position, special equipment/implants available, Radiology Safety Procedures followed, medications/allergies/relevent history reviewed, required imaging and test results available. Performed  The patient has been on adequate anticoagulation. The patient received IV lidocain and Propofol for sedation. Synchronous cardioversion was performed at 120 joules.  The cardioversion was successful.  Complications: No apparent complications Patient did tolerate procedure well.  [name], MD, [facility] 08/18/2014, 11:05 AM    Assessment/Plan: 1. PAF - s/p recent cardioversion - on amiodarone - but on higher dose than what was prescribed - holding in sinus. Discussed with Dr. Caryl Comes - restart amiodarone at 200 mg a day - to start back on Saturday.   2. CAD - no active symptoms - continue with CV risk factor modification  3. HTN - BP still up some - she will monitor - I do not think we should cut medicines back at this time.  4. Chronic anticoagulation - no problems noted. Will need to get her INR checked since she has been on higher doses of amiodarone than recommended.   5. High risk medication - cutting amiodarone back. Send for PFTs with diffusion capacity.   I suspect a lot of her symptoms will improve with time and reduction of her dose of amiodarone. She does need PFTs' and these are arranged today. To see Dr. Caryl Comes back as planned.  Current medicines are reviewed with the patient today.   The patient does not have concerns regarding medicines other than what has been noted  above.  The following changes have been made:  See above.  Labs/ tests ordered today include:    Orders Placed This Encounter  Procedures  . Basic metabolic panel  . CBC  . Hepatic function panel  . EKG 12-Lead  . Pulmonary function test     Disposition:   FU with Dr. Caryl Comes  In March as planned.   Patient is agreeable to this plan and will call if any problems develop in the interim.   Signed: Burtis Junes, RN, ANP-C 08/23/2014 2:37 PM  [place] [address] [place], [place]  [address] Phone: [phone] Fax: [phone]

## 2014-08-23 NOTE — Patient Instructions (Signed)
We will be checking the following labs today BMET, CBC, TSh, HPF  We will arrange for PFTs with diffusion capacity- at [facility]  Stay on your current medicines but cut the amiodarone back to just 200 mg once a day - start back on this tomorrow.  Monitor your BP at home - keep a diary and bring to your next visit along with your cuff.  We will check your coumadin level today  See Dr. Caryl Comes back as planned  Call the [place] office at [phone] if you have any questions, problems or concerns.

## 2014-08-26 ENCOUNTER — Encounter: Payer: Self-pay | Admitting: Internal Medicine

## 2014-08-26 ENCOUNTER — Ambulatory Visit (HOSPITAL_COMMUNITY)
Admission: RE | Admit: 2014-08-26 | Discharge: 2014-08-26 | Disposition: A | Discharge status: Home / Self Care | Payer: Medicare Other | Source: Ambulatory Visit | Attending: Nurse Practitioner | Admitting: Nurse Practitioner

## 2014-08-26 DIAGNOSIS — I481 Persistent atrial fibrillation: Secondary | ICD-10-CM | POA: Diagnosis present

## 2014-08-26 DIAGNOSIS — I4819 Other persistent atrial fibrillation: Secondary | ICD-10-CM

## 2014-08-26 LAB — PULMONARY FUNCTION TEST
DL/VA % pred: 112 %
DL/VA: 5.54 ml/min/mmHg/L
DLCO cor % pred: 62 %
DLCO cor: 15.93 ml/min/mmHg
DLCO unc % pred: 62 %
DLCO unc: 15.93 ml/min/mmHg
FEF 25-75 Post: 1.17 L/sec
FEF 25-75 Pre: 1.2 L/sec
FEF2575-%Change-Post: -2 %
FEF2575-%Pred-Post: 83 %
FEF2575-%Pred-Pre: 85 %
FEV1-%Change-Post: 0 %
FEV1-%Pred-Post: 89 %
FEV1-%Pred-Pre: 90 %
FEV1-Post: 1.79 L
FEV1-Pre: 1.79 L
FEV1FVC-%Change-Post: 0 %
FEV1FVC-%Pred-Pre: 98 %
FEV6-%Change-Post: -1 %
FEV6-%Pred-Post: 94 %
FEV6-%Pred-Pre: 95 %
FEV6-Post: 2.39 L
FEV6-Pre: 2.42 L
FEV6FVC-%Change-Post: 0 %
FEV6FVC-%Pred-Post: 104 %
FEV6FVC-%Pred-Pre: 104 %
FVC-%Change-Post: -1 %
FVC-%Pred-Post: 90 %
FVC-%Pred-Pre: 91 %
FVC-Post: 2.44 L
FVC-Pre: 2.46 L
Post FEV1/FVC ratio: 73 %
Post FEV6/FVC ratio: 99 %
Pre FEV1/FVC ratio: 73 %
Pre FEV6/FVC Ratio: 98 %
RV % pred: 99 %
RV: 2.44 L
TLC % pred: 93 %
TLC: 4.85 L

## 2014-08-26 MED ORDER — ALBUTEROL SULFATE (2.5 MG/3ML) 0.083% IN NEBU
2.5000 mg | INHALATION_SOLUTION | Freq: Once | RESPIRATORY_TRACT | Status: AC
Start: 2014-08-26 — End: 2014-08-26
  Administered 2014-08-26: 2.5 mg via RESPIRATORY_TRACT

## 2014-08-27 ENCOUNTER — Other Ambulatory Visit: Payer: Self-pay | Admitting: Internal Medicine

## 2014-08-30 ENCOUNTER — Other Ambulatory Visit: Payer: Self-pay | Admitting: Internal Medicine

## 2014-09-05 ENCOUNTER — Ambulatory Visit (INDEPENDENT_AMBULATORY_CARE_PROVIDER_SITE_OTHER): Payer: Medicare Other | Admitting: *Deleted

## 2014-09-05 DIAGNOSIS — I481 Persistent atrial fibrillation: Secondary | ICD-10-CM

## 2014-09-05 DIAGNOSIS — Z5181 Encounter for therapeutic drug level monitoring: Secondary | ICD-10-CM

## 2014-09-05 DIAGNOSIS — I4891 Unspecified atrial fibrillation: Secondary | ICD-10-CM

## 2014-09-05 DIAGNOSIS — I4819 Other persistent atrial fibrillation: Secondary | ICD-10-CM

## 2014-09-05 DIAGNOSIS — Z7901 Long term (current) use of anticoagulants: Secondary | ICD-10-CM

## 2014-09-05 LAB — POCT INR: INR: 3

## 2014-09-23 ENCOUNTER — Ambulatory Visit (INDEPENDENT_AMBULATORY_CARE_PROVIDER_SITE_OTHER): Payer: Medicare Other | Admitting: *Deleted

## 2014-09-23 ENCOUNTER — Ambulatory Visit (INDEPENDENT_AMBULATORY_CARE_PROVIDER_SITE_OTHER): Payer: Medicare Other | Admitting: Internal Medicine

## 2014-09-23 ENCOUNTER — Encounter: Payer: Self-pay | Admitting: Internal Medicine

## 2014-09-23 VITALS — BP 140/80 | HR 75 | Ht 66.0 in | Wt 182.4 lb

## 2014-09-23 DIAGNOSIS — I480 Paroxysmal atrial fibrillation: Secondary | ICD-10-CM

## 2014-09-23 DIAGNOSIS — Z5181 Encounter for therapeutic drug level monitoring: Secondary | ICD-10-CM

## 2014-09-23 DIAGNOSIS — I4891 Unspecified atrial fibrillation: Secondary | ICD-10-CM

## 2014-09-23 DIAGNOSIS — Z45018 Encounter for adjustment and management of other part of cardiac pacemaker: Secondary | ICD-10-CM

## 2014-09-23 DIAGNOSIS — Z7901 Long term (current) use of anticoagulants: Secondary | ICD-10-CM

## 2014-09-23 LAB — MDC_IDC_ENUM_SESS_TYPE_INCLINIC
Battery Remaining Longevity: 121 mo
Battery Voltage: 2.79 V
Brady Statistic AP VP Percent: 1 %
Brady Statistic AS VP Percent: 0 %
Brady Statistic AS VS Percent: 0 %
Date Time Interrogation Session: 20160311090409
Lead Channel Pacing Threshold Amplitude: 0.5 V
Lead Channel Pacing Threshold Amplitude: 0.5 V
Lead Channel Pacing Threshold Pulse Width: 0.4 ms
Lead Channel Sensing Intrinsic Amplitude: 22.4 mV
Lead Channel Sensing Intrinsic Amplitude: 4 mV
Lead Channel Setting Pacing Amplitude: 2.5 V
Lead Channel Setting Pacing Pulse Width: 0.4 ms
Lead Channel Setting Sensing Sensitivity: 5.6 mV
MDC IDC MSMT BATTERY IMPEDANCE: 186 Ohm
MDC IDC MSMT LEADCHNL RA IMPEDANCE VALUE: 448 Ohm
MDC IDC MSMT LEADCHNL RV IMPEDANCE VALUE: 800 Ohm
MDC IDC MSMT LEADCHNL RV PACING THRESHOLD PULSEWIDTH: 0.4 ms
MDC IDC SET LEADCHNL RA PACING AMPLITUDE: 2 V
MDC IDC STAT BRADY AP VS PERCENT: 99 %

## 2014-09-23 LAB — POCT INR: INR: 3.2

## 2014-09-23 NOTE — Progress Notes (Signed)
Patient Care Team: [name], MD as PCP - General (Internal Medicine) [name], MD (Cardiology)   HPI  [name] [name] is a 81 y.o. female  Seen in followup for atrial fibrillation in the setting of modest nonobstructive coronary disease and normal left ventricular function. She is status post pacemaker implantation for sinus node dysfunction and posttermination pauses. She's been treated with flecainide and subsequently with Rythmol and most recently amiodarone.    she developed symptoms of exercise intolerance and shortness of breath on Thursday   ECG verified in fact that she is in atrial fibrillation;  she was cardioverted 11/24. She has felt considerably better since then. She does have some complaints about the procedure with a little bit of residual fall as well as a burn.  She has persistent edema She saw Dr. Rayann Heman to consider catheter ablation but at that juncture wanted to continue taking amiodarone.   She also has a thoracic aortic aneurysm followed by Dr. Servando Snare  IN Feb she underwent cardioversion for recurrent afib, was seen shortly thereafeter by [facility] and amio was decreased  She was admitted for pulmonary function testing; her DLCO was 62% predicted. She denies cough.  She's had problems with swelling in her feet; this is at least coincidental with the up titration of her amlodipine. She also complains of some balance heart;   this is not particularly worsened over time.       Myoview scanning for atypical chest pain and dyspnea on exertion was undertaken 7/14 and was normal      Past Medical History  Diagnosis Date  . Persistent atrial fibrillation   . sinus node dysfunction//post termination pauses   . HTN (hypertension)   . CAD (coronary artery disease)     LHC 5/08:  pOM 20%, pRCA 20-30%, EF 60%  . NASH (nonalcoholic steatohepatitis)   . PMR (polymyalgia rheumatica)     no steriods for 5 years  . HLD (hyperlipidemia)   . Neuromuscular  disorder   . Hepatitis     hx of medication induced hepatitis (Vytorin per pt)  . Ascending aorta enlargement     4.8 cm per echo 08/07/11; 5.0 by CT in Jan 2013  . Pacemaker-[employer]tr[employer] 11/12/2011    Implanted 2013   . Hx of echocardiogram     echo 1/13: EF 55%, Gr 2 diast dysfn, Asc Ao aneurysm 4.8 cm, mild MR, mod LAE, mild RAE    Past Surgical History  Procedure Laterality Date  . Umbilical hernia repair  1950's  . Incision and drainage breast abscess  1973  . Cardiac catheterization    . Hernia repair    . Pacemaker insertion  Jan 2013  . Cardioversion  03/02/2012    Procedure: CARDIOVERSION;  Surgeon: Lelon Perla, MD;  Location: [place];  Service: Cardiovascular;  Laterality: N/A;  . Cardioversion  03/13/2012    Procedure: CARDIOVERSION;  Surgeon: Josue Hector, MD;  Location: [facility] ENDOSCOPY;  Service: Cardiovascular;  Laterality: N/A;  . Cardioversion  04/15/2012    Procedure: CARDIOVERSION;  Surgeon: [name], MD;  Location: [facility] ENDOSCOPY;  Service: Cardiovascular;  Laterality: N/A;  . Cardioversion N/A 06/07/2014    Procedure: CARDIOVERSION;  Surgeon: [name], MD;  Location: [place];  Service: Cardiovascular;  Laterality: N/A;  . Permanent pacemaker insertion N/A 07/29/2011    Procedure: PERMANENT PACEMAKER INSERTION;  Surgeon: [name], MD;  Location: [facility] CATH LAB;  Service: Cardiovascular;  Laterality: N/A;  .  Cardioversion N/A 08/18/2014    Procedure: CARDIOVERSION;  Surgeon: [name], MD;  Location: [facility] ENDOSCOPY;  Service: Cardiovascular;  Laterality: N/A;    Current Outpatient Prescriptions  Medication Sig Dispense Refill  . amiodarone (PACERONE) 200 MG tablet Take 1 tablet (200 mg total) by mouth daily. 30 tablet 6  . amLODipine (NORVASC) 5 MG tablet Take 5 mg by mouth daily.    . Calcium Carbonate-Vitamin D (CALTRATE 600+D PO) Take 1 tablet by mouth 2 (two) times daily.    . cetirizine (ZYRTEC) 10 MG tablet Take 10 mg by mouth as  needed for allergies.     . Cholecalciferol (VITAMIN D3) 1000 UNITS tablet Take 1,000 Units by mouth daily.      . diclofenac sodium (VOLTAREN) 1 % GEL Apply 2 g topically daily as needed (for pain).    . furosemide (LASIX) 20 MG tablet Take 2 tablets (40 mg total) by mouth every other day. (Patient taking differently: Take 20 mg by mouth daily. ) 30 tablet 12  . ipratropium (ATROVENT) 0.03 % nasal spray Place 2 sprays into both nostrils daily as needed for rhinitis.    . multivitamin (THERAGRAN) per tablet Take 1 tablet by mouth daily.      . nadolol (CORGARD) 40 MG tablet TAKE THREE TABLETS BY MOUTH EVERY EVENING 90 tablet 6  . potassium chloride SA (K-DUR,KLOR-CON) 20 MEQ tablet Take 40 mEq by mouth daily.     . valsartan (DIOVAN) 320 MG tablet Take 1 tablet (320 mg total) by mouth daily. 30 tablet 11  . warfarin (COUMADIN) 2.5 MG tablet Take as directed by coumadin clinic (Patient taking differently: Take 1.25-2.5 mg by mouth daily. Take 1.14m by mouth on Monday, Wednesday, Friday and Sunday. Take 2.511mby mouth on all other days) 40 tablet 3  . WELCHOL 625 MG tablet Take 625-1,250 mg by mouth 2 (two) times daily. Take 625 mg every morning and 1250 mg at bedtime.     No current facility-administered medications for this visit.    Allergies  Allergen Reactions  . Penicillins Anaphylaxis  . Statins Other (See Comments)    REACTION: elevated LFT's  . Tylenol [Acetaminophen] Other (See Comments)    If taken with Vytorin at risk for liver damage  . Codeine Other (See Comments)    REACTION: feel funny    Review of Systems negative except from HPI and PMH  Physical Exam BP 140/80 mmHg  Pulse 75  Ht 5' 6"  (1.676 m)  Wt 182 lb 6.4 oz (82.736 kg)  BMI 29.45 kg/m2 Well developed and nourished in no acute distress HENT normal Neck supple with JVP 8  Clear Irregularly irregular rate and rhythm, no murmurs or gallops Abd-soft with active BS No Clubbing cyanosis  R>L 2+ edema.;  nonpitting and involving primarily the forefoot Skin-warm and dry A & Oriented  Grossly normal sensory and motor function   ECG was ordered.  ECG demonstrates atrial pacing at  75 25/11/40 Axis left -48   Ovary function tests were reviewed. DLCO was 62%. Other laboratories were reviewed including amiodarone surveillance laboratories normal 2/16 and 1/60.  Assessment and  Plan  Edema  Atrial fibrillaton paroxysmal   Sinus node dysfunction   Hypertension  Gait instability  High risk medication surveillance  Pacemaker The patient's device was interrogated.  The information was reviewed. No changes were made in the programming.     The patient has paroxysmal atrial fibrillation. He then discussed the role of catheter ablation versus  intermittent cardioversion. She would like to continue with amiodarone and intermittent cardioversion as a strategy of choice.  We discussed her pulmonary function tests with a DLCO of 62%. She does not have cough which is a frequent concomitant pulmonary lung injury. We will anticipate repeat ovary function testing in 6 months just to make sure that these values are stable.  Discussed also the possibility that her gait instability is related to amiodarone; I suggested that she follow-up with her PCP and will be in contact with him regarding the consideration of balance rehabilitation.  As relates to her edema, it is worse in the context of her amlodipine. We will discontinue the amlodipine and see if it abates prior to adjusting her diuretics. She will let us know in 2 weeks time the response to the Norvasc exclusion experime   blood pressure is reasonably controlled at this time although we anticipate we'll change with the Norvasc exclusion as outlined above

## 2014-09-23 NOTE — Patient Instructions (Signed)
Your physician recommends that you continue on your current medications as directed. Please refer to the Current Medication list given to you today.  Please hold your Amlodipine for 10 days -- then call the office and let [name] Goettel, RN how you are doing -- we will determine whether or not to re-start this medication.   Remote monitoring is used to monitor your Pacemaker of ICD from home. This monitoring reduces the number of office visits required to check your device to one time per year. It allows [place] to keep an eye on the functioning of your device to ensure it is working properly. You are scheduled for a device check from home on 12/26/14. You may send your transmission at any time that day. If you have a wireless device, the transmission will be sent automatically. After your physician reviews your transmission, you will receive a postcard with your next transmission date.  Your physician wants you to follow-up in: July/August with [name] Palau, NP.  You will receive a reminder letter in the mail two months in advance. If you don't receive a letter, please call our office to schedule the follow-up appointment.

## 2014-09-28 ENCOUNTER — Other Ambulatory Visit: Payer: Self-pay | Admitting: *Deleted

## 2014-09-28 DIAGNOSIS — I719 Aortic aneurysm of unspecified site, without rupture: Secondary | ICD-10-CM

## 2014-10-04 ENCOUNTER — Encounter: Payer: Self-pay | Admitting: Internal Medicine

## 2014-10-07 ENCOUNTER — Ambulatory Visit (INDEPENDENT_AMBULATORY_CARE_PROVIDER_SITE_OTHER): Payer: Medicare Other | Admitting: *Deleted

## 2014-10-07 DIAGNOSIS — I4891 Unspecified atrial fibrillation: Secondary | ICD-10-CM

## 2014-10-07 DIAGNOSIS — Z7901 Long term (current) use of anticoagulants: Secondary | ICD-10-CM | POA: Diagnosis not present

## 2014-10-07 DIAGNOSIS — Z5181 Encounter for therapeutic drug level monitoring: Secondary | ICD-10-CM

## 2014-10-07 LAB — POCT INR: INR: 2.5

## 2014-10-11 ENCOUNTER — Encounter: Payer: Self-pay | Admitting: *Deleted

## 2014-10-17 LAB — BUN: BUN: 17 mg/dL (ref 6–23)

## 2014-10-17 LAB — CREATININE, SERUM: Creat: 0.9 mg/dL (ref 0.50–1.10)

## 2014-10-20 ENCOUNTER — Ambulatory Visit (INDEPENDENT_AMBULATORY_CARE_PROVIDER_SITE_OTHER): Payer: Medicare Other | Admitting: Cardiothoracic Surgery

## 2014-10-20 ENCOUNTER — Ambulatory Visit
Admission: RE | Admit: 2014-10-20 | Discharge: 2014-10-20 | Disposition: A | Discharge status: Home / Self Care | Payer: Medicare Other | Source: Ambulatory Visit | Attending: Cardiothoracic Surgery | Admitting: Cardiothoracic Surgery

## 2014-10-20 ENCOUNTER — Encounter: Payer: Self-pay | Admitting: Cardiothoracic Surgery

## 2014-10-20 VITALS — BP 125/73 | HR 92 | Resp 20 | Ht 66.0 in | Wt 182.0 lb

## 2014-10-20 DIAGNOSIS — I712 Thoracic aortic aneurysm, without rupture, unspecified: Secondary | ICD-10-CM

## 2014-10-20 DIAGNOSIS — I719 Aortic aneurysm of unspecified site, without rupture: Secondary | ICD-10-CM

## 2014-10-20 MED ORDER — IOPAMIDOL (ISOVUE-370) INJECTION 76%
75.0000 mL | Freq: Once | INTRAVENOUS | Status: AC | PRN
  Administered 2014-10-20: 75 mL via INTRAVENOUS

## 2014-10-20 NOTE — Progress Notes (Signed)
[name]       [name],[name] [address]             [phone]        [name] [name] [facility] Medical Record #[id number] Date of Birth: 03-09-1931  Referring: [name], MD Primary Care: [name], MD  Chief Complaint:    Chief Complaint  Patient presents with  . Thoracic Aortic Aneurysm    1 year f/u with CTA Chest    History of Present Illness:    Patient is a 81 -year-old female with a history of atrial fib on Coumadin. On July 29 2011 a pacemaker was placed. In followup an echocardiogram was performed that demonstrated dilated descending aorta. Further evaluation with a CT scan of the chest was performed. Patient's referred for evaluation of dilated acescending aorta. She has no history of aortic valve disease.  The patient returns today with a followup CT scan for comparison to the scan done in 12 months ago.  She has no family history of aneurysm disease or sudden death, her grandfather did die in his 75s of a cerebral aneurysm.  She has been diligent about blood pressure control and taking her beta blocker but notes it is as high as 140's /80's. She was also on Norvasc but last month stopped it because of lower extremity edema. This edema has persisted. She is also on amiodarone, there was a concern by cardiology about gait disturbance, she notes she still has some instability with ambulation.  Patient denies any shortness of breath or chest discomfort at rest, some mild sob with exertion , no angina  Current Activity/ Functional Status: Patient is independent with mobility/ambulation, transfers, ADL's, IADL's.   Past Medical History  Diagnosis Date  . Persistent atrial fibrillation   . sinus node dysfunction//post termination pauses   . HTN (hypertension)   . CAD (coronary artery disease)     LHC 5/08:  pOM 20%, pRCA 20-30%, EF 60%  . NASH (nonalcoholic steatohepatitis)   . PMR (polymyalgia rheumatica)     no steriods for 5 years  .  HLD (hyperlipidemia)   . Neuromuscular disorder   . Hepatitis     hx of medication induced hepatitis (Vytorin per pt)  . Ascending aorta enlargement     4.8 cm per echo 08/07/11; 5.0 by CT in Jan 2013  . Pacemaker-Medtronic 11/12/2011    Implanted 2013   . Hx of echocardiogram     echo 1/13: EF 55%, Gr 2 diast dysfn, Asc Ao aneurysm 4.8 cm, mild MR, mod LAE, mild RAE    Past Surgical History  Procedure Laterality Date  . Umbilical hernia repair  1950's  . Incision and drainage breast abscess  1973  . Cardiac catheterization    . Hernia repair    . Pacemaker insertion  Jan 2013  . Cardioversion  03/02/2012    Procedure: CARDIOVERSION;  Surgeon: Lelon Perla, MD;  Location: [facility] ENDOSCOPY;  Service: Cardiovascular;  Laterality: N/A;  . Cardioversion  03/13/2012    Procedure: CARDIOVERSION;  Surgeon: Josue Hector, MD;  Location: [facility] ENDOSCOPY;  Service: Cardiovascular;  Laterality: N/A;  . Cardioversion  04/15/2012    Procedure: CARDIOVERSION;  Surgeon: [name], MD;  Location: [facility] ENDOSCOPY;  Service: Cardiovascular;  Laterality: N/A;  . Cardioversion N/A 06/07/2014    Procedure: CARDIOVERSION;  Surgeon: [name], MD;  Location: [place];  Service: Cardiovascular;  Laterality: N/A;  . Permanent pacemaker insertion N/A  07/29/2011    Procedure: PERMANENT PACEMAKER INSERTION;  Surgeon: [name], MD;  Location: [facility] CATH LAB;  Service: Cardiovascular;  Laterality: N/A;  . Cardioversion N/A 08/18/2014    Procedure: CARDIOVERSION;  Surgeon: [name], MD;  Location: [facility] ENDOSCOPY;  Service: Cardiovascular;  Laterality: N/A;    Family History  Problem Relation Age of Onset  . Coronary artery disease    . Alzheimer's disease        History  Smoking status  . Former Smoker  . Types: Cigarettes  . Quit date: 07/16/1990  Smokeless tobacco  . Never Used    History  Alcohol Use No     Allergies  Allergen Reactions  . Penicillins Anaphylaxis  .  Statins Other (See Comments)    REACTION: elevated LFT's  . Tylenol [Acetaminophen] Other (See Comments)    If taken with Vytorin at risk for liver damage  . Codeine Other (See Comments)    REACTION: feel funny    Current Outpatient Prescriptions  Medication Sig Dispense Refill  . amiodarone (PACERONE) 200 MG tablet Take 1 tablet (200 mg total) by mouth daily. 30 tablet 6  . Calcium Carbonate-Vitamin D (CALTRATE 600+D PO) Take 1 tablet by mouth 2 (two) times daily.    . cetirizine (ZYRTEC) 10 MG tablet Take 10 mg by mouth as needed for allergies.     . Cholecalciferol (VITAMIN D3) 1000 UNITS tablet Take 1,000 Units by mouth daily.      . diclofenac sodium (VOLTAREN) 1 % GEL Apply 2 g topically daily as needed (for pain).    . furosemide (LASIX) 20 MG tablet Take 2 tablets (40 mg total) by mouth every other day. (Patient taking differently: Take 20 mg by mouth daily. ) 30 tablet 12  . ipratropium (ATROVENT) 0.03 % nasal spray Place 2 sprays into both nostrils daily as needed for rhinitis.    . multivitamin (THERAGRAN) per tablet Take 1 tablet by mouth daily.      . nadolol (CORGARD) 40 MG tablet TAKE THREE TABLETS BY MOUTH EVERY EVENING 90 tablet 6  . potassium chloride SA (K-DUR,KLOR-CON) 20 MEQ tablet Take 40 mEq by mouth daily.     . valsartan (DIOVAN) 320 MG tablet Take 1 tablet (320 mg total) by mouth daily. 30 tablet 11  . warfarin (COUMADIN) 2.5 MG tablet Take as directed by coumadin clinic (Patient taking differently: Take 1.25-2.5 mg by mouth daily. Take 1.26m by mouth on Monday, Wednesday, Friday and Sunday. Take 2.525mby mouth on all other days) 40 tablet 3  . WELCHOL 625 MG tablet Take 625-1,250 mg by mouth 2 (two) times daily. Take 625 mg every morning and 1250 mg at bedtime.     No current facility-administered medications for this visit.       Review of Systems:     Cardiac Review of Systems: Y or N  Chest Pain [ n   ]  Resting SOB [   ] Exertional SOB  [n  ]    Orthopnea [nFlorencio.Farrier   Pedal Edema [ n  ]    Palpitations [yBlue.Reese] Syncope  [nFlorencio.Farrier]   Presyncope [nFlorencio.Farrier ]  General Review of Systems: [Y] = yes [  ]=no Constitional: recent weight change [  ]; anorexia [  ]; fatigue [  ]; nausea [  ]; night sweats [  ]; fever [  ]; or chills [  ];  Dental: poor dentition[  n]; Last Dentist visit: regular every 6 months  Eye : blurred vision [  ]; diplopia [   ]; vision changes [  ];  Amaurosis fugax[  ]; Resp: cough [  ];  wheezing[  ];  hemoptysis[  ]; shortness of breath[  ]; paroxysmal nocturnal dyspnea[  ]; dyspnea on exertion[  ]; or orthopnea[  ];  GI:  gallstones[  ], vomiting[  ];  dysphagia[  ]; melena[  ];  hematochezia [  ]; heartburn[  ];   Hx of  Colonoscopy[  ]; GU: kidney stones [  ]; hematuria[  ];   dysuria [  ];  nocturia[  ];  history of     obstruction [  ];             Skin: rash, swelling[  ];, hair loss[  ];  peripheral edema[  ];  or itching[  ]; Musculosketetal: myalgias[ y ];  joint swelling[ y ];  joint erythema[  ];  joint pain[  ];  back pain[  ];  Heme/Lymph: bruising[ n ];  bleeding[  n];  anemia[  ];  Neuro: TIA[  ];  headaches[  ];  stroke[  ];  vertigo[  ];  seizures[  ];   paresthesias[  ];  difficulty walking[  ];  Psych:depression[  ]; anxiety[  ];  Endocrine: diabetes[ n ];  thyroid dysfunction[ n ];  Immunizations: Flu Blue.Reese  ]; Pneumococcal[y  ];  Other:  Physical Exam: BP 125/73 mmHg  Pulse 92  Resp 20  Ht 5' 6"  (1.676 m)  Wt 182 lb (82.555 kg)  BMI 29.39 kg/m2  SpO2 98%  General appearance: alert, cooperative, appears stated age and no distress Neurologic: intact Heart: irregularly irregular rhythm, she has no murmur of aortic insufficiency or MR Lungs: clear to auscultation bilaterally Abdomen: soft, non-tender; bowel sounds normal; no masses,  no organomegaly Extremities: extremities  normal, atraumatic, no cyanosis  and Homans sign is negative, no sign of DVT, does have venous statis changes both lower extremities. Patient has 2+ pitting edema No carotid bruits, 1+ dp and PT pulses  Diagnostic Studies & Laboratory data:     Recent Radiology Findings:  Ct Angio Chest Aorta W/cm &/or Wo/cm  11/04/2013   CLINICAL DATA:  Ascending aortic enlargement  EXAM: CT ANGIOGRAPHY CHEST WITH CONTRAST  TECHNIQUE: Multidetector CT imaging of the chest was performed using the standard protocol during bolus administration of intravenous contrast. Multiplanar CT image reconstructions and MIPs were obtained to evaluate the vascular anatomy.  CONTRAST:  77m OMNIPAQUE IOHEXOL 350 MG/ML SOLN  COMPARISON:  09/23/2012  FINDINGS: Stable small hypodense thyroid lesions. Dual lead pacer noted. At the level of the pulmonary artery on image 57 of series 4, the ascending aortic transverse caliber is 4.9 cm, no change from 09/23/2012. Central arch 3.2 cm on image 28 of series 4 (stable) and proximal descending thoracic aorta 3.1 cm (likewise stable) if scattered atherosclerotic calcification involving the ascending aorta, aortic arch, descending thoracic aorta, and branch vasculature noted. Mild cardiomegaly is again noted. No pathologic thoracic adenopathy or pericardial effusion.  There is a slightly greater amount of right eccentric mural thrombus in the descending thoracic aorta adjacent to the aortic hiatus on image 89 of series 4. The celiac trunk and nodes prompt proximal branches appear patent. An exophytic right kidney upper pole lesion is likely a cyst, measuring 13 Hounsfield units.  Stable mild right lower lobe scarring. Thoracic kyphosis and thoracic  spondylosis noted.  Review of the MIP images confirms the above findings.  IMPRESSION: 1. No change in size or appearance of ascending aortic aneurysm (which measures up to 4.9 cm) or descending thoracic aortic ectasia. 2. Thoracic kyphosis and spondylosis.    Electronically Signed   By: Sherryl Barters M.D.   On: 11/04/2013 09:10   Ct Angio Chest W/cm &/or Wo Cm  09/23/2012  *RADIOLOGY REPORT*  Clinical Data: Follow-up ascending aortic aneurysm.  CT ANGIOGRAPHY CHEST  Technique:  Multidetector CT imaging of the chest using the standard protocol during bolus administration of intravenous contrast. Multiplanar reconstructed images including MIPs were obtained and reviewed to evaluate the vascular anatomy.  Contrast: 2m OMNIPAQUE IOHEXOL 350 MG/ML SOLN  Comparison: 01/28/2012.  Findings: Low attenuation lesions in the thyroid measure up to 1.4 cm on the right, as before.  Ascending aorta measures up to 4.9 cm, unchanged from the prior examination.  Transverse aorta measures up to 3.2 cm and proximal descending thoracic aorta, 3.1 cm, also unchanged.  There is atherosclerotic irregularity and calcification of the aorta with calcification seen in the coronary arteries. Heart is mildly enlarged.  No pericardial effusion.  No pathologically enlarged mediastinal, hilar or axillary lymph nodes.  Scattered scarring in the right lower lobe and along the left major fissure.  No pleural fluid.  Airway is unremarkable.  Incidental imaging of the upper abdomen shows no acute findings. No worrisome lytic or sclerotic lesions.  IMPRESSION:  1.  Stable ascending aortic aneurysm. Transverse and descending thoracic aortic diameters are stable as well. 2.  Coronary artery calcification.   Original Report Authenticated By: MLorin Picket M .     ECHO: most recent echo 2013  Transthoracic Echocardiography  Patient: [name] [name], SchuffMR #: [id number] Date: 08/07/2011 Gender: F Age: [age]Height: 167.6cm Weight: 78.5kg BSA: 1.85m Pt. Status: Room:  SONOGRAPHER PaCharlann NossTTENDING DaLoralie ChampagneMD PERFORMING MoZacarias PontesSite 3 ORDERING KlVirl AxeECarney Livingc: Dr. JaJani Gravel------------------------------------------------------------ LV EF:  55% - 60%  ------------------------------------------------------------ Indications: Atrial fibrillation - 427.31.  ------------------------------------------------------------ History: PMH: Acquired from the patient and from the patient's chart. Atrial fibrillation. Risk factors: Hypertension. Dyslipidemia.  ------------------------------------------------------------ Study Conclusions  - Left ventricle: The cavity size was normal. Wall thickness was normal. Systolic function was normal. The estimated ejection fraction was 55%. Although no diagnostic regional wall motion abnormality was identified, this possibility cannot be completely excluded on the basis of this study. Features are consistent with a pseudonormal left ventricular filling pattern, with concomitant abnormal relaxation and increased filling pressure (grade 2 diastolic dysfunction). - Aortic valve: There was no stenosis. - Aorta: Normal aortic root size. Ascending aortic aneurysm reaching 4.8 cm. - Mitral valve: Mild regurgitation. - Left atrium: The atrium was moderately dilated. - Right ventricle: The cavity size was normal. Pacer wire or catheter noted in right ventricle. Systolic function was normal. - Right atrium: The atrium was mildly dilated. - Tricuspid valve: Peak RV-RA gradient:2125mg (S). - Pulmonary arteries: PA systolic pressure 27-96-75Hg. - Systemic veins: IVC measured 2.0 cm with normal respirophasic variation, suggesting RA pressure 6-10 mmHg. - Pericardium, extracardiac: A trivial pericardial effusion was identified. Impressions:  - Normal LV size and systolic function, EF 55%91%oderate diastolic dysfunction. Biatrial enlargement. Normal RV size and systolic function. Mild mitral regurgitation. Ascending aortic aneurysm reaching 4.8 cm by echo. Would consider MRA chest to fully assess thoracic aorta. Transthoracic echocardiography. M-mode, complete 2D, spectral Doppler, and color  Doppler.  Height: Height: 167.6cm. Height: 66in. Weight: Weight: 78.5kg. Weight: 172.6lb. Body mass index: BMI: 27.9kg/m^2. Body surface area: BSA: 1.31m2. Blood pressure: 164/83. Patient status: Outpatient. Location: [place] [facility]  ------------------------------------------------------------  ------------------------------------------------------------ Left ventricle: The cavity size was normal. Wall thickness was normal. Systolic function was normal. The estimated ejection fraction was 55%. Although no diagnostic regional wall motion abnormality was identified, this possibility cannot be completely excluded on the basis of this study. Features are consistent with a pseudonormal left ventricular filling pattern, with concomitant abnormal relaxation and increased filling pressure (grade 2 diastolic dysfunction).  ------------------------------------------------------------ Aortic valve: Trileaflet; mildly calcified leaflets. Doppler: There was no stenosis. No regurgitation.  ------------------------------------------------------------ Aorta: Normal aortic root size. Ascending aortic aneurysm reaching 4.8 cm.  ------------------------------------------------------------ Mitral valve: Doppler: There was no evidence for stenosis. Mild regurgitation.  ------------------------------------------------------------ Left atrium: The atrium was moderately dilated.  ------------------------------------------------------------ Right ventricle: The cavity size was normal. Pacer wire or catheter noted in right ventricle. Systolic function was normal.  ------------------------------------------------------------ Pulmonic valve: Structurally normal valve. Cusp separation was normal. Doppler: Transvalvular velocity was within the normal range. No regurgitation.  ------------------------------------------------------------ Tricuspid valve: Doppler: Trivial  regurgitation.  ------------------------------------------------------------ Pulmonary artery: PA systolic pressure 254-65mmHg.  ------------------------------------------------------------ Right atrium: The atrium was mildly dilated.  ------------------------------------------------------------ Pericardium: A trivial pericardial effusion was identified.  ------------------------------------------------------------ Systemic veins: IVC measured 2.0 cm with normal respirophasic variation, suggesting RA pressure 6-10 mmHg.  ------------------------------------------------------------ Post procedure conclusions Ascending Aorta:  - Normal aortic root size. Ascending aortic aneurysm reaching 4.8 cm.  ------------------------------------------------------------  2D measurements Normal Doppler measurements Norma Left ventricle l LVID ED, 44.3 mm 43-52 Left ventricle chord, Ea, lat 8.6 cm/s ----- PLAX ann, tiss 6 LVID ES, 30.9 mm 23-38 DP chord, E/Ea, lat 6.6 ----- PLAX ann, tiss 7 FS, chord, 30 % >29 DP PLAX Ea, med 4.9 cm/s ----- LVPW, ED 8.92 mm ------ ann, tiss 3 IVS/LVPW 0.88 <1.3 DP ratio, ED E/Ea, med 11. ----- Ventricular septum ann, tiss 72 IVS, ED 7.85 mm ------ DP LVOT LVOT Diam, S 21 mm ------ Peak vel, 103 cm/s ----- Area 3.46 cm^2 ------ S Diam 21 mm ------ VTI, S 25. cm ----- Aorta 8 Root diam, 32 mm ------ HR 60 bpm ----- ED Stroke 89. ml ----- Left atrium vol 4 AP dim 2.23 cm/m^2 <2.2 Cardiac 5.4 L/min ----- index output Cardiac 2.9 L/(min-m ----- index ^2) Stroke 47. ml/m^2 ----- index 5 Mitral valve Peak E 57. cm/s ----- vel 8 Peak A 54. cm/s ----- vel 8 Decelerat 391 ms 150-2 ion time 30 Peak E/A 1.1 ----- ratio Tricuspid valve Peak 19 mm Hg ----- RV-RA gradient, S  ------------------------------------------------------------ Prepared and Electronically Authenticated by  DLoralie Champagne MD 2013-01-23T20:42:[id number]   Followup CT scan  of the chest done July 2013 Ct Angio Chest Aorta W/cm &/or Wo/cm  10/20/2014   CLINICAL DATA:  Thoracic aortic aneurysm.  EXAM: CT ANGIOGRAPHY CHEST WITH CONTRAST  TECHNIQUE: Multidetector CT imaging of the chest was performed using the standard protocol during bolus administration of intravenous contrast. Multiplanar CT image reconstructions and MIPs were obtained to evaluate the vascular anatomy.  CONTRAST:  75 mL of Isovue 370 intravenously.  COMPARISON:  CT scan of November 04, 2013.  FINDINGS: No pneumothorax or pleural effusion is noted. No acute pulmonary disease is noted. Ascending thoracic aorta has maximum measured diameter of 4.9 cm which is not significantly changed compared to prior exam. Transverse aortic arch measures 3.1 cm which is not significantly changed compared to  prior exam. Proximal portion of descending thoracic aorta measures 3.1 cm which is not significantly changed compared to prior exam. No dissection is noted. Atherosclerotic calcifications are again noted. No mediastinal mass or adenopathy is noted. Left-sided pacemaker is unchanged in position. Pulmonary arteries appear grossly normal. Stable mild cardiomegaly is noted. Stable exophytic cyst is seen arising from upper pole of right kidney.  Review of the MIP images confirms the above findings.  IMPRESSION: Stable ascending thoracic aortic aneurysm with maximum measured diameter 4.9 cm. No significant changes noted compared to prior exam.   Electronically Signed   By: Marijo Conception, M.D.   On: 10/20/2014 13:17    Ct Angio Chest W/cm &/or Wo Cm  01/28/2012  *RADIOLOGY REPORT*  Clinical Data: Follow-up aortic aneurysm.  CT ANGIOGRAPHY CHEST  Technique:  Multidetector CT imaging of the chest using the standard protocol during bolus administration of intravenous contrast. Multiplanar reconstructed images including MIPs were obtained and reviewed to evaluate the vascular anatomy.  Contrast: 172m OMNIPAQUE IOHEXOL 300 MG/ML  SOLN   Comparison: 08/13/2011  Findings: Again noted is the mild aneurysmal dilatation of the ascending aorta, currently measuring maximally 4.7 cm compared 5.0 cm previously.  No dissection or significant change.  Proximal descending thoracic aorta measures maximally 3.1 cm.  Descending thoracic aorta at the aortic hiatus measures 2.8 cm.  Distal thoracic aortic calcifications noted.  Left-sided pacer remains in place, unchanged.  Coronary artery calcifications present. No mediastinal, hilar, or axillary adenopathy.  Linear scarring in the lung bases.  Lungs otherwise clear.  No pleural effusions.  No suspicious pulmonary nodules or masses.  Visualized chest wall soft tissues are unremarkable.  Imaging into the upper abdomen demonstrates no acute findings.  No acute bony abnormality.  Mild degenerative changes and kyphosis in the thoracic spine.  IMPRESSION: Stable ascending aortic aneurysm, measuring maximally 4.7 cm.  Coronary artery disease.  Original Report Authenticated By: KRaelyn Number M.D.    Recent Lab Findings: Lab Results  Component Value Date   WBC 6.1 08/23/2014   HGB 14.1 08/23/2014   HCT 41.1 08/23/2014   PLT 281.0 08/23/2014   GLUCOSE 104* 08/23/2014   ALT 20 08/23/2014   AST 17 08/23/2014   NA 141 08/23/2014   K 4.2 08/23/2014   CL 107 08/23/2014   CREATININE 0.90 09/28/2014   BUN 17 10/17/2014   CO2 30 08/23/2014   TSH 1.82 06/21/2014   INR 2.5 10/07/2014      Assessment / Plan:  1/  ascending aortic aneurysm (which measures up to 4.9 cm)  with no change by CTA over the past year          Aortic Size Index=      4.9 /Body surface area is 1.96 meters squared. = 2.5 index                       < 2.75 cm/m2      4% risk per year               2.75 to 4.25          8% risk per year              > 4.25 cm/m2    20% risk per year  2/descending thoracic aortic ectasia 3/Thoracic kyphosis and spondylosis 4/ history of afib on coumadin 5/  I have reviewed the patient'sCT scan,  she does have a dilated ascending aorta approximately 4.9 cm maximum  diameter without evidence of aortic valve disease. There has been no change over a since 2013  . At this point with her age of 9 years size less then 5.5 cm and index 2.47  I would not recommend elective repair of her ascending aorta as her yearly risk from aortic  rupture is 4%. I've discussed with her the risks of dissection and consequences of that versus the risk of elective repair. She understands a good blood pressure control including beta blockers important. We'll plan to see her back in approximately 12 months with a followup CTA of the aorta to evaluate any change in size.   She will discuss with cardiology the persistent lower extremity edema even after stopping her [place] MD  Beeper [phone] Office [phone] 10/20/2014 2:46 PM

## 2014-10-31 ENCOUNTER — Ambulatory Visit (INDEPENDENT_AMBULATORY_CARE_PROVIDER_SITE_OTHER): Payer: Medicare Other | Admitting: *Deleted

## 2014-10-31 DIAGNOSIS — I4891 Unspecified atrial fibrillation: Secondary | ICD-10-CM

## 2014-10-31 DIAGNOSIS — Z5181 Encounter for therapeutic drug level monitoring: Secondary | ICD-10-CM

## 2014-10-31 DIAGNOSIS — Z7901 Long term (current) use of anticoagulants: Secondary | ICD-10-CM | POA: Diagnosis not present

## 2014-10-31 LAB — POCT INR: INR: 3

## 2014-11-24 ENCOUNTER — Other Ambulatory Visit: Payer: Self-pay | Admitting: Internal Medicine

## 2014-11-25 NOTE — Telephone Encounter (Signed)
Please advise on how patient should be taking this. Thanks, MI

## 2014-11-28 ENCOUNTER — Ambulatory Visit (INDEPENDENT_AMBULATORY_CARE_PROVIDER_SITE_OTHER): Payer: Medicare Other | Admitting: *Deleted

## 2014-11-28 DIAGNOSIS — Z5181 Encounter for therapeutic drug level monitoring: Secondary | ICD-10-CM

## 2014-11-28 DIAGNOSIS — Z7901 Long term (current) use of anticoagulants: Secondary | ICD-10-CM

## 2014-11-28 DIAGNOSIS — I4891 Unspecified atrial fibrillation: Secondary | ICD-10-CM | POA: Diagnosis not present

## 2014-11-28 LAB — POCT INR: INR: 2.3

## 2014-11-29 NOTE — Telephone Encounter (Signed)
She should be taking 40 mEq daily.  (This looks like it was an old rx)

## 2014-12-13 ENCOUNTER — Telehealth: Payer: Self-pay | Admitting: Internal Medicine

## 2014-12-13 NOTE — Telephone Encounter (Signed)
Feet swollen for several days now.  Took an extra Lasix yesterday, with no decrease in pedal edema. But did lose 1 to 1 1/2 pds. States she went to PCP today who stopped Amlodipine to see if this was the cause of edema. He started her on a different med but she cannot remember the name of it. I advised patient to call me back next week if edema did not improve after stopping medication.  Advised to also contact if edema worsens or weight gain of 3 lb overnight/5 lb in a week. Patient verbalized understanding and agreeable to plan.

## 2014-12-13 NOTE — Telephone Encounter (Signed)
Pt c/o swelling: STAT is pt has developed SOB within 24 hours  1. How long have you been experiencing swelling? 1 week   2. Where is the swelling located? Both feet   3.  Are you currently taking a "fluid pill"? Yes   4.  Are you currently SOB? no  5.  Have you traveled recently? no

## 2014-12-16 ENCOUNTER — Telehealth: Payer: Self-pay | Admitting: *Deleted

## 2014-12-16 NOTE — Telephone Encounter (Signed)
Patient called to inform us that she is being started on Apresoline 25 mg twice a day by her PCP. Advised that the medication does not interfere with Coumadin/Warfarin and is safe to take as prescribed by her physician. She states she will inform us of any other changes as needed.

## 2014-12-26 ENCOUNTER — Ambulatory Visit (INDEPENDENT_AMBULATORY_CARE_PROVIDER_SITE_OTHER): Payer: Medicare Other | Admitting: *Deleted

## 2014-12-26 DIAGNOSIS — I495 Sick sinus syndrome: Secondary | ICD-10-CM

## 2014-12-26 NOTE — Progress Notes (Signed)
Remote pacemaker transmission.   

## 2014-12-30 LAB — CUP PACEART REMOTE DEVICE CHECK
Battery Remaining Longevity: 120 mo
Brady Statistic AP VP Percent: 1 %
Brady Statistic AP VS Percent: 99 %
Brady Statistic AS VP Percent: 0 %
Brady Statistic AS VS Percent: 0 %
Lead Channel Impedance Value: 441 Ohm
Lead Channel Impedance Value: 800 Ohm
Lead Channel Pacing Threshold Amplitude: 0.5 V
Lead Channel Pacing Threshold Pulse Width: 0.4 ms
Lead Channel Sensing Intrinsic Amplitude: 16 mV
Lead Channel Setting Pacing Amplitude: 2 V
Lead Channel Setting Sensing Sensitivity: 5.6 mV
MDC IDC MSMT BATTERY IMPEDANCE: 186 Ohm
MDC IDC MSMT BATTERY VOLTAGE: 2.79 V
MDC IDC MSMT LEADCHNL RV PACING THRESHOLD AMPLITUDE: 0.5 V
MDC IDC MSMT LEADCHNL RV PACING THRESHOLD PULSEWIDTH: 0.4 ms
MDC IDC SESS DTM: 20160613105810
MDC IDC SET LEADCHNL RV PACING AMPLITUDE: 2.5 V
MDC IDC SET LEADCHNL RV PACING PULSEWIDTH: 0.4 ms

## 2015-01-02 ENCOUNTER — Ambulatory Visit (INDEPENDENT_AMBULATORY_CARE_PROVIDER_SITE_OTHER): Payer: Medicare Other | Admitting: Pharmacist

## 2015-01-02 DIAGNOSIS — I4891 Unspecified atrial fibrillation: Secondary | ICD-10-CM | POA: Diagnosis not present

## 2015-01-02 DIAGNOSIS — Z5181 Encounter for therapeutic drug level monitoring: Secondary | ICD-10-CM

## 2015-01-02 DIAGNOSIS — Z7901 Long term (current) use of anticoagulants: Secondary | ICD-10-CM

## 2015-01-02 LAB — POCT INR: INR: 2.5

## 2015-01-20 ENCOUNTER — Encounter: Payer: Self-pay | Admitting: Cardiology

## 2015-01-20 ENCOUNTER — Encounter: Payer: Self-pay | Admitting: Internal Medicine

## 2015-01-23 ENCOUNTER — Other Ambulatory Visit: Payer: Self-pay

## 2015-01-23 ENCOUNTER — Other Ambulatory Visit: Payer: Self-pay | Admitting: Internal Medicine

## 2015-01-23 DIAGNOSIS — Z1231 Encounter for screening mammogram for malignant neoplasm of breast: Secondary | ICD-10-CM

## 2015-01-26 ENCOUNTER — Ambulatory Visit (INDEPENDENT_AMBULATORY_CARE_PROVIDER_SITE_OTHER): Payer: Medicare Other | Admitting: *Deleted

## 2015-01-26 ENCOUNTER — Encounter: Payer: Self-pay | Admitting: Internal Medicine

## 2015-01-26 DIAGNOSIS — I481 Persistent atrial fibrillation: Secondary | ICD-10-CM

## 2015-01-26 DIAGNOSIS — I495 Sick sinus syndrome: Secondary | ICD-10-CM

## 2015-01-26 DIAGNOSIS — I4819 Other persistent atrial fibrillation: Secondary | ICD-10-CM

## 2015-01-26 NOTE — Progress Notes (Signed)
Patient seen in device clinic for [facility] upgrade only (N/C). After explaining both of the upgrade options to patient she chose the wirex as the best option for her. A demonstration was performed on how to use the equipment. All of the patient's questions were answered. Patient voiced her understanding of all of the information provided. Patient to follow up via [facility] on 9/14 and with SK as scheduled. Tyler ordered in [place].

## 2015-01-30 ENCOUNTER — Ambulatory Visit (INDEPENDENT_AMBULATORY_CARE_PROVIDER_SITE_OTHER): Payer: Medicare Other | Admitting: Pharmacist

## 2015-01-30 DIAGNOSIS — I4819 Other persistent atrial fibrillation: Secondary | ICD-10-CM

## 2015-01-30 DIAGNOSIS — Z7901 Long term (current) use of anticoagulants: Secondary | ICD-10-CM

## 2015-01-30 DIAGNOSIS — I4891 Unspecified atrial fibrillation: Secondary | ICD-10-CM

## 2015-01-30 DIAGNOSIS — I481 Persistent atrial fibrillation: Secondary | ICD-10-CM | POA: Diagnosis not present

## 2015-01-30 DIAGNOSIS — Z5181 Encounter for therapeutic drug level monitoring: Secondary | ICD-10-CM | POA: Diagnosis not present

## 2015-01-30 LAB — POCT INR: INR: 2.8

## 2015-02-13 ENCOUNTER — Other Ambulatory Visit: Payer: Self-pay | Admitting: Internal Medicine

## 2015-02-21 ENCOUNTER — Other Ambulatory Visit: Payer: Self-pay

## 2015-02-21 MED ORDER — VALSARTAN 320 MG PO TABS: 320.0000 mg | ORAL_TABLET | Freq: Every day | ORAL | Status: DC

## 2015-02-27 ENCOUNTER — Ambulatory Visit
Admission: RE | Admit: 2015-02-27 | Discharge: 2015-02-27 | Disposition: A | Discharge status: Home / Self Care | Payer: Medicare Other | Source: Ambulatory Visit

## 2015-02-27 ENCOUNTER — Ambulatory Visit (INDEPENDENT_AMBULATORY_CARE_PROVIDER_SITE_OTHER): Payer: Medicare Other | Admitting: *Deleted

## 2015-02-27 DIAGNOSIS — Z1231 Encounter for screening mammogram for malignant neoplasm of breast: Secondary | ICD-10-CM

## 2015-02-27 DIAGNOSIS — I481 Persistent atrial fibrillation: Secondary | ICD-10-CM | POA: Diagnosis not present

## 2015-02-27 DIAGNOSIS — I4891 Unspecified atrial fibrillation: Secondary | ICD-10-CM

## 2015-02-27 DIAGNOSIS — Z7901 Long term (current) use of anticoagulants: Secondary | ICD-10-CM | POA: Diagnosis not present

## 2015-02-27 DIAGNOSIS — I4819 Other persistent atrial fibrillation: Secondary | ICD-10-CM

## 2015-02-27 DIAGNOSIS — Z5181 Encounter for therapeutic drug level monitoring: Secondary | ICD-10-CM

## 2015-02-27 LAB — POCT INR: INR: 2.3

## 2015-03-09 ENCOUNTER — Telehealth: Payer: Self-pay | Admitting: Internal Medicine

## 2015-03-09 NOTE — Telephone Encounter (Signed)
Spoke with pt and she wanted to know why she was being sent to see Roderic Palau, NP vs one of the PA/NPs in our office. I informed pt that she was sent to Roderic Palau because of her Afib. Pt asked if PA/NPs here weren't qualified to take care of that. Advised pt that all of our providers were qualified to take care of pt's with Afib but she was sent to [facility] because she is in the [facility]. Pt verbalized understanding and was agreement to proceed with appt at Afib clinic.

## 2015-03-09 NOTE — Telephone Encounter (Signed)
New Message        Pt calling wanting to ask a question about her appt w/ Doristine Devoid for Monday. If pt doesn't answer home phone call mobile number. Please call back and advise.

## 2015-03-13 ENCOUNTER — Ambulatory Visit (HOSPITAL_COMMUNITY)
Admission: RE | Admit: 2015-03-13 | Discharge: 2015-03-13 | Disposition: A | Discharge status: Home / Self Care | Payer: Medicare Other | Source: Ambulatory Visit | Attending: Nurse Practitioner | Admitting: Nurse Practitioner

## 2015-03-13 ENCOUNTER — Encounter (HOSPITAL_COMMUNITY): Payer: Self-pay | Admitting: Nurse Practitioner

## 2015-03-13 VITALS — BP 130/86 | HR 74 | Ht 66.0 in | Wt 177.6 lb

## 2015-03-13 DIAGNOSIS — Z950 Presence of cardiac pacemaker: Secondary | ICD-10-CM | POA: Diagnosis not present

## 2015-03-13 DIAGNOSIS — M353 Polymyalgia rheumatica: Secondary | ICD-10-CM | POA: Insufficient documentation

## 2015-03-13 DIAGNOSIS — I480 Paroxysmal atrial fibrillation: Secondary | ICD-10-CM

## 2015-03-13 DIAGNOSIS — Z7901 Long term (current) use of anticoagulants: Secondary | ICD-10-CM | POA: Insufficient documentation

## 2015-03-13 DIAGNOSIS — Z87891 Personal history of nicotine dependence: Secondary | ICD-10-CM | POA: Diagnosis not present

## 2015-03-13 DIAGNOSIS — Z79899 Other long term (current) drug therapy: Secondary | ICD-10-CM | POA: Insufficient documentation

## 2015-03-13 DIAGNOSIS — I2510 Atherosclerotic heart disease of native coronary artery without angina pectoris: Secondary | ICD-10-CM | POA: Insufficient documentation

## 2015-03-13 DIAGNOSIS — E785 Hyperlipidemia, unspecified: Secondary | ICD-10-CM | POA: Insufficient documentation

## 2015-03-13 DIAGNOSIS — Z8249 Family history of ischemic heart disease and other diseases of the circulatory system: Secondary | ICD-10-CM | POA: Insufficient documentation

## 2015-03-13 DIAGNOSIS — I10 Essential (primary) hypertension: Secondary | ICD-10-CM | POA: Insufficient documentation

## 2015-03-13 DIAGNOSIS — Z880 Allergy status to penicillin: Secondary | ICD-10-CM | POA: Insufficient documentation

## 2015-03-13 NOTE — Progress Notes (Signed)
Patient ID: [name] [name], female   DOB: [birth year], 81 y.o.   MRN: [id number]     Primary Care Physician: [name], MD Referring Physician:Dr. Prerana Strayer is a 81 y.o. female with a h/o PAF in the setting of modest nonobstructive cad and normal left ventricular function. S/p PPM for sinus node dysfunction and post termination pauses. She has been treated with flecainide, rythmal  and most recently amiodarone. Her amiodarone dose was decreased to 100 mg the first of the year, unfortunately did not hold her in rhythm and she had to be cardioverted. She has seen Dr. Rayann Heman for a possible ablation but he quoted 50-60% percent chance of success and pt is concerned with complications. She does report that since her dose of amiodarone was increased to 200 mg a day that she has not noted anymore afib. She had her last PFT in 2/15, which was slightly abnormal  and will be repeated now to make sure lung status is stable, per Dr. Olin Pia recommendation, labs in June with PCP, TSH normal and she brings in labs just done 8/9 with PCP,with a normal liver panel. She does not complain of any cough or dyspnea. Tolerating warfarin.   Today, she denies symptoms of palpitations, chest pain, shortness of breath, orthopnea, PND, lower extremity edema, dizziness, presyncope, syncope, or neurologic sequela. The patient is tolerating medications without difficulties and is otherwise without complaint today.   Past Medical History  Diagnosis Date  . Persistent atrial fibrillation   . sinus node dysfunction//post termination pauses   . HTN (hypertension)   . CAD (coronary artery disease)     LHC 5/08:  pOM 20%, pRCA 20-30%, EF 60%  . NASH (nonalcoholic steatohepatitis)   . PMR (polymyalgia rheumatica)     no steriods for 5 years  . HLD (hyperlipidemia)   . Neuromuscular disorder   . Hepatitis     hx of medication induced hepatitis (Vytorin per pt)  . Ascending aorta enlargement     4.8 cm per echo  08/07/11; 5.0 by CT in Jan 2013  . Pacemaker-Medtronic 11/12/2011    Implanted 2013   . Hx of echocardiogram     echo 1/13: EF 55%, Gr 2 diast dysfn, Asc Ao aneurysm 4.8 cm, mild MR, mod LAE, mild RAE   Past Surgical History  Procedure Laterality Date  . Umbilical hernia repair  1950's  . Incision and drainage breast abscess  1973  . Cardiac catheterization    . Hernia repair    . Pacemaker insertion  Jan 2013  . Cardioversion  03/02/2012    Procedure: CARDIOVERSION;  Surgeon: Lelon Perla, MD;  Location: [place];  Service: Cardiovascular;  Laterality: N/A;  . Cardioversion  03/13/2012    Procedure: CARDIOVERSION;  Surgeon: Josue Hector, MD;  Location: [facility] ENDOSCOPY;  Service: Cardiovascular;  Laterality: N/A;  . Cardioversion  04/15/2012    Procedure: CARDIOVERSION;  Surgeon: [name], MD;  Location: [facility] ENDOSCOPY;  Service: Cardiovascular;  Laterality: N/A;  . Cardioversion N/A 06/07/2014    Procedure: CARDIOVERSION;  Surgeon: [name], MD;  Location: [place];  Service: Cardiovascular;  Laterality: N/A;  . Permanent pacemaker insertion N/A 07/29/2011    Procedure: PERMANENT PACEMAKER INSERTION;  Surgeon: [name], MD;  Location: [facility] CATH LAB;  Service: Cardiovascular;  Laterality: N/A;  . Cardioversion N/A 08/18/2014    Procedure: CARDIOVERSION;  Surgeon: [name], MD;  Location: [place];  Service: Cardiovascular;  Laterality: N/A;  Current Outpatient Prescriptions  Medication Sig Dispense Refill  . amiodarone (PACERONE) 200 MG tablet Take 1 tablet (200 mg total) by mouth daily. 30 tablet 6  . Calcium Carbonate-Vitamin D (CALTRATE 600+D PO) Take 1 tablet by mouth 2 (two) times daily.    . cetirizine (ZYRTEC) 10 MG tablet Take 10 mg by mouth as needed for allergies.     . Cholecalciferol (VITAMIN D3) 1000 UNITS tablet Take 1,000 Units by mouth daily.      . diclofenac sodium (VOLTAREN) 1 % GEL Apply 2 g topically daily as needed (for pain).     . furosemide (LASIX) 20 MG tablet Take 2 tablets (40 mg total) by mouth every other day. (Patient taking differently: Take 20 mg by mouth daily. ) 30 tablet 12  . ipratropium (ATROVENT) 0.03 % nasal spray Place 2 sprays into both nostrils daily as needed for rhinitis.    . multivitamin (THERAGRAN) per tablet Take 1 tablet by mouth daily.      . nadolol (CORGARD) 40 MG tablet TAKE THREE TABLETS BY MOUTH EVERY EVENING 90 tablet 6  . potassium chloride SA (K-DUR,KLOR-CON) 20 MEQ tablet Take 2 tablets (40 mEq total) by mouth daily. 60 tablet 5  . valsartan (DIOVAN) 320 MG tablet Take 1 tablet (320 mg total) by mouth daily. 30 tablet 1  . warfarin (COUMADIN) 2.5 MG tablet TAKE AS DIRECTED 40 tablet 3  . WELCHOL 625 MG tablet Take 625-1,250 mg by mouth 2 (two) times daily. Take 625 mg every morning and 1250 mg at bedtime.     No current facility-administered medications for this encounter.    Allergies  Allergen Reactions  . Penicillins Anaphylaxis  . Statins Other (See Comments)    REACTION: elevated LFT's  . Tylenol [Acetaminophen] Other (See Comments)    If taken with Vytorin at risk for liver damage  . Codeine Other (See Comments)    REACTION: feel funny    Social History   Social History  . Marital Status: Married    Spouse Name: N/A  . Number of Children: N/A  . Years of Education: N/A   Occupational History  . Not on file.   Social History Main Topics  . Smoking status: Former Smoker    Types: Cigarettes    Quit date: 07/16/1990  . Smokeless tobacco: Never Used  . Alcohol Use: No  . Drug Use: No  . Sexual Activity: No   Other Topics Concern  . Not on file   Social History Narrative   Pt lives in [place] with spouse.  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 Coronary artery disease    . Alzheimer's disease      ROS- All systems are reviewed and negative except as per the HPI  above  Physical Exam: Filed Vitals:   03/13/15 0901  BP: 130/86  Pulse: 74  Height: 5' 6"  (1.676 m)  Weight: 177 lb 9.6 oz (80.559 kg)    GEN- The patient is well appearing, alert and oriented x 3 today.   Head- normocephalic, atraumatic Eyes-  Sclera clear, conjunctiva pink Ears- hearing intact Oropharynx- clear Neck- supple, no JVP Lymph- no cervical lymphadenopathy Lungs- Clear to ausculation bilaterally, normal work of breathing Heart- Regular rate and rhythm, no murmurs, rubs or gallops, PMI not laterally displaced GI- soft, NT, ND, + BS Extremities- no clubbing, cyanosis, or edema MS- no significant deformity or atrophy Skin- no rash  or lesion Psych- euthymic mood, full affect Neuro- strength and sensation are intact  EKG- Atrial paced rhythm with prolonged AV conduction. V rate 74 bpm, Pr int 300 ms, QTc 486 ms. LABS, TSH- 6/29 2.8, 8/9 comprehensive panel, Creat 1.02, Liver panel unremarkable.  Assessment and Plan:  1. PAF Maintaining SR on amiodarone PFT's to be scheduled Labs up to date and normal for amiodarone therapy.  2. PPM  F/u with device clinic 9/14  3. Chadsvasc score of at least 4  Continue with warfarin.  F/u in 3 months.  Geroge Baseman Carroll, [facility] [address] [place], [place] [address] [phone]

## 2015-03-16 ENCOUNTER — Other Ambulatory Visit (HOSPITAL_COMMUNITY): Payer: Self-pay | Admitting: *Deleted

## 2015-03-16 DIAGNOSIS — I4891 Unspecified atrial fibrillation: Secondary | ICD-10-CM

## 2015-03-25 ENCOUNTER — Emergency Department (HOSPITAL_COMMUNITY): Payer: Medicare Other

## 2015-03-25 ENCOUNTER — Emergency Department (INDEPENDENT_AMBULATORY_CARE_PROVIDER_SITE_OTHER)
Admission: EM | Admit: 2015-03-25 | Discharge: 2015-03-25 | Disposition: A | Discharge status: Nursing Home (Includes ICF) | Payer: Medicare Other | Source: Home / Self Care | Attending: Family Medicine | Admitting: Family Medicine

## 2015-03-25 ENCOUNTER — Inpatient Hospital Stay (HOSPITAL_COMMUNITY)
Admission: EM | Admit: 2015-03-25 | Discharge: 2015-03-29 | DRG: 065 | Disposition: A | Discharge status: Home / Self Care | Payer: Medicare Other | Attending: Neurology | Admitting: Neurology

## 2015-03-25 ENCOUNTER — Encounter (HOSPITAL_COMMUNITY): Payer: Self-pay | Admitting: *Deleted

## 2015-03-25 ENCOUNTER — Encounter (HOSPITAL_COMMUNITY): Payer: Self-pay | Admitting: Emergency Medicine

## 2015-03-25 DIAGNOSIS — K7581 Nonalcoholic steatohepatitis (NASH): Secondary | ICD-10-CM | POA: Diagnosis present

## 2015-03-25 DIAGNOSIS — Z87891 Personal history of nicotine dependence: Secondary | ICD-10-CM

## 2015-03-25 DIAGNOSIS — M353 Polymyalgia rheumatica: Secondary | ICD-10-CM | POA: Diagnosis present

## 2015-03-25 DIAGNOSIS — R51 Headache: Secondary | ICD-10-CM

## 2015-03-25 DIAGNOSIS — I2510 Atherosclerotic heart disease of native coronary artery without angina pectoris: Secondary | ICD-10-CM | POA: Diagnosis present

## 2015-03-25 DIAGNOSIS — I480 Paroxysmal atrial fibrillation: Secondary | ICD-10-CM | POA: Diagnosis not present

## 2015-03-25 DIAGNOSIS — Z7901 Long term (current) use of anticoagulants: Secondary | ICD-10-CM

## 2015-03-25 DIAGNOSIS — H547 Unspecified visual loss: Secondary | ICD-10-CM

## 2015-03-25 DIAGNOSIS — Z950 Presence of cardiac pacemaker: Secondary | ICD-10-CM

## 2015-03-25 DIAGNOSIS — I619 Nontraumatic intracerebral hemorrhage, unspecified: Secondary | ICD-10-CM | POA: Diagnosis present

## 2015-03-25 DIAGNOSIS — R519 Headache, unspecified: Secondary | ICD-10-CM

## 2015-03-25 DIAGNOSIS — I481 Persistent atrial fibrillation: Secondary | ICD-10-CM | POA: Diagnosis present

## 2015-03-25 DIAGNOSIS — I10 Essential (primary) hypertension: Secondary | ICD-10-CM | POA: Diagnosis present

## 2015-03-25 DIAGNOSIS — I611 Nontraumatic intracerebral hemorrhage in hemisphere, cortical: Secondary | ICD-10-CM | POA: Diagnosis present

## 2015-03-25 DIAGNOSIS — E785 Hyperlipidemia, unspecified: Secondary | ICD-10-CM | POA: Diagnosis present

## 2015-03-25 DIAGNOSIS — I6789 Other cerebrovascular disease: Secondary | ICD-10-CM | POA: Diagnosis not present

## 2015-03-25 LAB — PROTIME-INR
INR: 2.35 — ABNORMAL HIGH (ref 0.00–1.49)
INR: 1.17 (ref 0.00–1.49)
PROTHROMBIN TIME: 25.5 s — AB (ref 11.6–15.2)
Prothrombin Time: 15.1 seconds (ref 11.6–15.2)

## 2015-03-25 LAB — COMPREHENSIVE METABOLIC PANEL
ALBUMIN: 3.2 g/dL — AB (ref 3.5–5.0)
ALT: 24 U/L (ref 14–54)
AST: 24 U/L (ref 15–41)
Alkaline Phosphatase: 60 U/L (ref 38–126)
Anion gap: 7 (ref 5–15)
BILIRUBIN TOTAL: 1.1 mg/dL (ref 0.3–1.2)
BUN: 14 mg/dL (ref 6–20)
CHLORIDE: 106 mmol/L (ref 101–111)
CO2: 24 mmol/L (ref 22–32)
CREATININE: 1.16 mg/dL — AB (ref 0.44–1.00)
Calcium: 8.9 mg/dL (ref 8.9–10.3)
GFR calc Af Amer: 50 mL/min — ABNORMAL LOW (ref >60)
GFR, EST NON AFRICAN AMERICAN: 43 mL/min — AB (ref >60)
GLUCOSE: 97 mg/dL (ref 65–99)
Potassium: 3.9 mmol/L (ref 3.5–5.1)
Sodium: 137 mmol/L (ref 135–145)
TOTAL PROTEIN: 6.2 g/dL — AB (ref 6.5–8.1)

## 2015-03-25 LAB — CBC WITH DIFFERENTIAL/PLATELET
BASOS ABS: 0 10*3/uL (ref 0.0–0.1)
BASOS PCT: 0 % (ref 0–1)
Eosinophils Absolute: 0.1 10*3/uL (ref 0.0–0.7)
Eosinophils Relative: 1 % (ref 0–5)
HEMATOCRIT: 40.9 % (ref 36.0–46.0)
Hemoglobin: 13.3 g/dL (ref 12.0–15.0)
LYMPHS PCT: 17 % (ref 12–46)
Lymphs Abs: 1 10*3/uL (ref 0.7–4.0)
MCH: 31.1 pg (ref 26.0–34.0)
MCHC: 32.5 g/dL (ref 30.0–36.0)
MCV: 95.8 fL (ref 78.0–100.0)
Monocytes Absolute: 0.8 10*3/uL (ref 0.1–1.0)
Monocytes Relative: 12 % (ref 3–12)
NEUTROS ABS: 4.3 10*3/uL (ref 1.7–7.7)
NEUTROS PCT: 70 % (ref 43–77)
Platelets: 213 10*3/uL (ref 150–400)
RBC: 4.27 MIL/uL (ref 3.87–5.11)
RDW: 14.9 % (ref 11.5–15.5)
WBC: 6.2 10*3/uL (ref 4.0–10.5)

## 2015-03-25 LAB — MRSA PCR SCREENING: MRSA by PCR: NEGATIVE

## 2015-03-25 LAB — APTT: aPTT: 35 seconds (ref 24–37)

## 2015-03-25 MED ORDER — PROTHROMBIN COMPLEX CONC HUMAN 500 UNITS IV KIT
2162.0000 [IU] | PACK | Status: AC
  Administered 2015-03-25: 2162 [IU] via INTRAVENOUS
  Filled 2015-03-25: qty 86

## 2015-03-25 MED ORDER — ACETAMINOPHEN 325 MG PO TABS
650.0000 mg | ORAL_TABLET | Freq: Once | ORAL | Status: AC
  Administered 2015-03-25: 650 mg via ORAL
  Filled 2015-03-25: qty 2

## 2015-03-25 MED ORDER — PANTOPRAZOLE SODIUM 40 MG IV SOLR
40.0000 mg | Freq: Every day | INTRAVENOUS | Status: DC
  Administered 2015-03-25 – 2015-03-26 (×2): 40 mg via INTRAVENOUS
  Filled 2015-03-25 (×4): qty 40

## 2015-03-25 MED ORDER — STROKE: EARLY STAGES OF RECOVERY BOOK
Freq: Once | Status: AC
  Administered 2015-03-25: 20:00:00
  Filled 2015-03-25: qty 1

## 2015-03-25 MED ORDER — SENNOSIDES-DOCUSATE SODIUM 8.6-50 MG PO TABS
1.0000 | ORAL_TABLET | Freq: Two times a day (BID) | ORAL | Status: DC
  Administered 2015-03-26 – 2015-03-29 (×7): 1 via ORAL
  Filled 2015-03-25 (×9): qty 1

## 2015-03-25 MED ORDER — LABETALOL HCL 5 MG/ML IV SOLN: 10.0000 mg | INTRAVENOUS | Status: DC | PRN

## 2015-03-25 MED ORDER — SODIUM CHLORIDE 0.9 % IV SOLN
Freq: Once | INTRAVENOUS | Status: AC
  Administered 2015-03-25: 14:00:00 via INTRAVENOUS

## 2015-03-25 MED ORDER — VITAMIN K1 10 MG/ML IJ SOLN
10.0000 mg | INTRAVENOUS | Status: AC
  Administered 2015-03-25: 10 mg via INTRAVENOUS
  Filled 2015-03-25: qty 1

## 2015-03-25 MED ORDER — INFLUENZA VAC SPLIT QUAD 0.5 ML IM SUSY
0.5000 mL | PREFILLED_SYRINGE | INTRAMUSCULAR | Status: AC
  Administered 2015-03-26: 0.5 mL via INTRAMUSCULAR
  Filled 2015-03-25: qty 0.5

## 2015-03-25 MED ORDER — LABETALOL HCL 5 MG/ML IV SOLN
10.0000 mg | Freq: Once | INTRAVENOUS | Status: AC
  Administered 2015-03-25: 10 mg via INTRAVENOUS
  Filled 2015-03-25: qty 4

## 2015-03-25 NOTE — ED Notes (Signed)
Report attempted 

## 2015-03-25 NOTE — ED Notes (Signed)
Per EMS: From [facility]. H/a since yesterday./  Top of head, dizziness, unsteady on feet.  No recent trauma.  On warfarin.   No obvious stroke like symptoms, one sided weakness.  12 lead unremarkable.  Hypertension with hx of same.  Anxious with EMS. States h/a came on suddenly yesterday at 0900 and does not feel like any headache she has had before.  Rarely has headaches, states she hasn't had one since she quit work.

## 2015-03-25 NOTE — H&P (Addendum)
Admission H&P    Chief Complaint: Headache 2 days  HPI: [name] [name] is an 81 y.o. female with a history of atrial fibrillation on Coumadin therapy, status post permanent pacemaker (Medtronic) Dr Caryl Comes, hypertension, coronary artery disease, NASH, polymyalgia rheumatica, hyperlipidemia, and an ascending aortic aneurysm followed by Dr. Servando Snare. Yesterday the patient had just gotten out of the shower when she noticed an odd sort of headache on the top of her head. She doesn't normally have headaches. She took tramadol with some relief. The headache continued for most of the day. She slept well that night but when she got up today she still had a headache. She took another tramadol and felt somewhat drowsy, tired, and mildly dizzy. She states her blood pressure has been running high lately. She checks it at home. She has been getting systolics in the 384-536 range.  The patient came to the emergency department at [facility] where a CT scan of the head revealed an acute intraparenchymal hemorrhage measuring 2.6 x 1.8 cm is noted in the left posterior parietal cortex. Mild amount of surrounding white matter edema is noted. No midline shift is noted. Ventricular size is within normal limits. The patient is somewhat anxious and upset but is otherwise in no acute distress. Her physical exam is essentially normal. Her Coumadin is being reversed at this time. Her INR on admission was 2.35. The patient will be admitted to the neuro intensive care unit for close monitoring. Her NIH score at this time is 0.   Past Medical History  Diagnosis Date  . Persistent atrial fibrillation   . sinus node dysfunction//post termination pauses   . HTN (hypertension)   . CAD (coronary artery disease)     LHC 5/08:  pOM 20%, pRCA 20-30%, EF 60%  . NASH (nonalcoholic steatohepatitis)   . PMR (polymyalgia rheumatica)     no steriods for 5 years  . HLD (hyperlipidemia)   . Neuromuscular disorder   . Hepatitis      hx of medication induced hepatitis (Vytorin per pt)  . Ascending aorta enlargement     4.8 cm per echo 08/07/11; 5.0 by CT in Jan 2013  . Pacemaker-Medtronic 11/12/2011    Implanted 2013   . Hx of echocardiogram     echo 1/13: EF 55%, Gr 2 diast dysfn, Asc Ao aneurysm 4.8 cm, mild MR, mod LAE, mild RAE    Past Surgical History  Procedure Laterality Date  . Umbilical hernia repair  1950's  . Incision and drainage breast abscess  1973  . Cardiac catheterization    . Hernia repair    . Pacemaker insertion  Jan 2013  . Cardioversion  03/02/2012    Procedure: CARDIOVERSION;  Surgeon: Lelon Perla, MD;  Location: [place];  Service: Cardiovascular;  Laterality: N/A;  . Cardioversion  03/13/2012    Procedure: CARDIOVERSION;  Surgeon: Josue Hector, MD;  Location: [facility] ENDOSCOPY;  Service: Cardiovascular;  Laterality: N/A;  . Cardioversion  04/15/2012    Procedure: CARDIOVERSION;  Surgeon: [name], MD;  Location: [facility] ENDOSCOPY;  Service: Cardiovascular;  Laterality: N/A;  . Cardioversion N/A 06/07/2014    Procedure: CARDIOVERSION;  Surgeon: [name], MD;  Location: [place];  Service: Cardiovascular;  Laterality: N/A;  . Permanent pacemaker insertion N/A 07/29/2011    Procedure: PERMANENT PACEMAKER INSERTION;  Surgeon: [name], MD;  Location: [facility] CATH LAB;  Service: Cardiovascular;  Laterality: N/A;  . Cardioversion N/A 08/18/2014    Procedure: CARDIOVERSION;  Surgeon: [name], MD;  Location: [facility] ENDOSCOPY;  Service: Cardiovascular;  Laterality: N/A;                    Family history - Her mother died at age 33. She had had a stroke although it sounds as if she possibly died from coronary artery disease. Her father died in his 50Y from complications of Alzheimer's disease. She has a brother who had a stroke as well as heart disease. She has a grandfather who died from a stroke. She has a niece who died from an aneurysm; however, she doesn't know where the  aneurysm was located. She states that vascular problems run in her family.   Social History:  reports that she quit smoking about 24 years ago. Her smoking use included Cigarettes. She has never used smokeless tobacco. She reports that she does not drink alcohol or use illicit drugs.  Allergies:  Allergies  Allergen Reactions  . Penicillins Anaphylaxis  . Statins Other (See Comments)    REACTION: elevated LFT's  . Tylenol [Acetaminophen] Other (See Comments)    If taken with Vytorin at risk for liver damage  . Tape Other (See Comments)    Redness, Please use "paper" tape only.  . Codeine Other (See Comments)    REACTION: feel funny    Medications:  Current Facility-Administered Medications  Medication Dose Route Frequency Provider Last Rate Last Dose  .  stroke: mapping our early stages of recovery book   Does not apply Once Drema Dallas, DO      . labetalol (NORMODYNE,TRANDATE) injection 10-40 mg  10-40 mg Intravenous Q10 min PRN Drema Dallas, DO      . pantoprazole (PROTONIX) injection 40 mg  40 mg Intravenous QHS Drema Dallas, DO      . senna-docusate (Senokot-S) tablet 1 tablet  1 tablet Oral BID Drema Dallas, DO       Current Outpatient Prescriptions  Medication Sig Dispense Refill  . amiodarone (PACERONE) 200 MG tablet Take 1 tablet (200 mg total) by mouth daily. 30 tablet 6  . Calcium Carbonate-Vitamin D (CALTRATE 600+D PO) Take 1 tablet by mouth 2 (two) times daily.    . cetirizine (ZYRTEC) 10 MG tablet Take 10 mg by mouth daily.     . Cholecalciferol (VITAMIN D3) 1000 UNITS tablet Take 1,000 Units by mouth at bedtime.     . furosemide (LASIX) 20 MG tablet Take 2 tablets (40 mg total) by mouth every other day. (Patient taking differently: Take 20 mg by mouth daily. ) 30 tablet 12  . hydrALAZINE (APRESOLINE) 25 MG tablet Take 25 mg by mouth 2 (two) times daily.     . multivitamin (THERAGRAN) per tablet Take 1 tablet by mouth at bedtime.     . nadolol (CORGARD) 40 MG  tablet TAKE THREE TABLETS BY MOUTH EVERY EVENING 90 tablet 6  . potassium chloride SA (K-DUR,KLOR-CON) 20 MEQ tablet Take 2 tablets (40 mEq total) by mouth daily. (Patient taking differently: Take 20 mEq by mouth 2 (two) times daily. ) 60 tablet 5  . traMADol (ULTRAM) 50 MG tablet Take 50 mg by mouth 2 (two) times daily as needed for moderate pain.    . valsartan (DIOVAN) 320 MG tablet Take 1 tablet (320 mg total) by mouth daily. 30 tablet 1  . warfarin (COUMADIN) 2.5 MG tablet TAKE AS DIRECTED (Patient taking differently: 5 mg on Tues and Sat.  2.5 mg all other days every  evening) 40 tablet 3  . WELCHOL 625 MG tablet Take 625-1,250 mg by mouth 2 (two) times daily. Take 625 mg every morning and 1250 mg at bedtime.       ROS: History obtained from the patient Negative review of systems except for arthritic complaints  General ROS: negative for - chills, fatigue, fever, night sweats, weight gain or weight loss Psychological ROS: negative for - behavioral disorder, hallucinations, memory difficulties, mood swings or suicidal ideation Ophthalmic ROS: negative for - blurry vision, double vision, eye pain or loss of vision ENT ROS: negative for - epistaxis, nasal discharge, oral lesions, sore throat, tinnitus or vertigo Allergy and Immunology ROS: negative for - hives or itchy/watery eyes Hematological and Lymphatic ROS: negative for - bleeding problems, bruising or swollen lymph nodes Endocrine ROS: negative for - galactorrhea, hair pattern changes, polydipsia/polyuria or temperature intolerance Respiratory ROS: negative for - cough, hemoptysis, shortness of breath or wheezing Cardiovascular ROS: negative for - chest pain, dyspnea on exertion, edema or irregular heartbeat Gastrointestinal ROS: negative for - abdominal pain, diarrhea, hematemesis, nausea/vomiting or stool incontinence Genito-Urinary ROS: negative for - dysuria, hematuria, incontinence or urinary frequency/urgency Musculoskeletal  ROS: negative for - joint swelling or muscular weakness Neurological ROS: as noted in HPI Dermatological ROS: negative for rash and skin lesion changes   Physical Examination: Blood pressure 176/76, pulse 65, temperature 98.1 F (36.7 C), temperature source Oral, resp. rate 19, height 5' (1.524 m), weight 78.926 kg (174 lb), SpO2 97 %.  General - pleasant but occasionally tearful 81 year old female in no acute distress. Heart - Regular rate and rhythm - no murmer appreciated Lungs - Clear to auscultation Abdomen - Soft - non tender Extremities - Distal pulses intact - no edema Skin - Warm and dry  Mental Status: Alert, oriented, thought content appropriate.  Speech fluent without evidence of aphasia.  Able to follow 3 step commands without difficulty. Cranial Nerves: II: Discs not visualized; Visual fields grossly normal, pupils equal, round, reactive to light and accommodation III,IV, VI: ptosis not present, extra-ocular motions intact bilaterally V,VII: smile symmetric, facial light touch sensation normal bilaterally VIII: hearing normal bilaterally IX,X: gag reflex present XI: bilateral shoulder shrug XII: midline tongue extension Motor: Right : Upper extremity   5/5    Left:     Upper extremity   5/5  Lower extremity   5/5     Lower extremity   5/5 Tone and bulk:normal tone throughout; no atrophy noted Sensory: Light touch intact throughout, bilaterally except the patient seemed uncertain about her left upper extremity. Possibly mildly decreased sensation to light touch. Deep Tendon Reflexes: 2+ and symmetric throughout Plantars: Right: downgoing   Left: downgoing Cerebellar: normal finger-to-nose, normal rapid alternating movements and normal heel-to-shin test Gait: Deferred   Laboratory Studies: Basic Metabolic Panel:  Recent Labs Lab 03/25/15 1600  NA 137  K 3.9  CL 106  CO2 24  GLUCOSE 97  BUN 14  CREATININE 1.16*  CALCIUM 8.9    Liver Function  Tests:  Recent Labs Lab 03/25/15 1600  AST 24  ALT 24  ALKPHOS 60  BILITOT 1.1  PROT 6.2*  ALBUMIN 3.2*   No results for input(s): LIPASE, AMYLASE in the last 168 hours. No results for input(s): AMMONIA in the last 168 hours.  CBC:  Recent Labs Lab 03/25/15 1600  WBC 6.2  NEUTROABS 4.3  HGB 13.3  HCT 40.9  MCV 95.8  PLT 213    Cardiac Enzymes: No results for input(s): CKTOTAL,  CKMB, CKMBINDEX, TROPONINI in the last 168 hours.  BNP: Invalid input(s): POCBNP  CBG: No results for input(s): GLUCAP in the last 168 hours.  Microbiology: Results for orders placed or performed during the hospital encounter of 07/29/11  Surgical pcr screen     Status: None   Collection Time: 07/29/11  6:24 AM  Result Value Ref Range Status   MRSA, PCR NEGATIVE NEGATIVE Final   Staphylococcus aureus NEGATIVE NEGATIVE Final    Comment:        The Xpert SA Assay (FDA approved for NASAL specimens only), is one component of a comprehensive surveillance program.  It is not intended to diagnose infection nor to guide or monitor treatment.    Coagulation Studies:  Recent Labs  03/25/15 1600  LABPROT 25.5*  INR 2.35*    Urinalysis: No results for input(s): COLORURINE, LABSPEC, PHURINE, GLUCOSEU, HGBUR, BILIRUBINUR, KETONESUR, PROTEINUR, UROBILINOGEN, NITRITE, LEUKOCYTESUR in the last 168 hours.  Invalid input(s): APPERANCEUR  Lipid Panel: No results found for: CHOL, TRIG, HDL, CHOLHDL, VLDL, LDLCALC  HgbA1C: No results found for: HGBA1C  Urine Drug Screen:  No results found for: LABOPIA, COCAINSCRNUR, LABBENZ, AMPHETMU, THCU, LABBARB   Alcohol Level: No results for input(s): ETH in the last 168 hours.  Other results: EKG: Paced rhythm rate 60 bpm    Imaging:  Ct Head Wo Contrast 03/25/2015    Bony calvarium appears intact. Acute intraparenchymal hemorrhage measuring 2.6 x 1.8 cm is noted in the left posterior parietal cortex. Mild amount of surrounding white matter  edema is noted. No midline shift is noted. Ventricular size is within normal limits. Acute left posterior parietal intraparenchymal hemorrhage is noted.     Assessment/Plan Pleasant 81 year old female with history of atrial fibrillation on Coumadin with a Medtronic permanent pacemaker who developed a headache yesterday and presented to the emergency department today for further evaluation. A CT scan revealed an acute intraparenchymal hemorrhage. She currently has a normal exam other than being somewhat anxious. Her INR was 2.35. Her warfarin is being reversed at this time. She will be admitted to the neuro intensive care unit for close monitoring. A follow-up CT has been ordered.  1) Admit to ICU 2) no antiplatelets or anticoagulants 3) blood pressure control with goal systolic <778 4) Frequent neuro checks 5) If symptoms worsen or there is decreased mental status, repeat stat head CT 6) PT,OT,ST  This patient is critically ill and at significant risk of neurological worsening, death and care requires constant monitoring of vital signs, hemodynamics,respiratory and cardiac monitoring,review of multiple databases, neurological assessment, discussion with family, other specialists and medical decision making of high complexity. I spent 55 inutes of neurocritical care time in the care of this patient.   Jim Like, DO [facility] [phone]  If 7pm- 7am, please page neurology on call as listed in [place].    Mikey Bussing PA-C [facility] Pager [phone] 03/25/2015, 6:20 PM

## 2015-03-25 NOTE — ED Notes (Signed)
Report  Called    t    [facility]  Charge  Nurse   Notified  Of  Patients      Symptoms     And  That     Dr  Juventino Slovak      Requested  Pt  Be  evaulated  In  Er  As   Soon  As  Possible

## 2015-03-25 NOTE — ED Notes (Signed)
Care  Link  Called  No  Unit  Available

## 2015-03-25 NOTE — Progress Notes (Signed)
~  2215: Pt c/o frontal HA 4/10. Also complain of mild "tinge" to left chest. No changed in EKG rhythmn, NSR Apaced. Wires repositioned and extra electrodes removed. Discussed home medications and reviewed pt's contraindication for tylenol.  Dr. Regenia Skeeter called, informed of pt's complaints and concerns. Orders for Tylenol 611m x1 and to advance diet as tolerated.  Family updated, will continue to monitor.

## 2015-03-25 NOTE — ED Notes (Signed)
20  Angio  l  Arm  1  Att   Site  Patent

## 2015-03-25 NOTE — ED Notes (Signed)
Pt  Reports      Headache       With      Some  Nausea       Since  Yesterday          Pt  Reports  Some  dizzyness  As  Well   -  Pt  Takes  Coumadin           Pain releived  Slightly  By trammadol       Pt  Is   Awake  Alert  And  orinted  denys  Any injury

## 2015-03-25 NOTE — ED Notes (Signed)
Dr. Janann Colonel at bedside

## 2015-03-25 NOTE — ED Notes (Signed)
CT called. Patient is next in line.

## 2015-03-25 NOTE — ED Provider Notes (Signed)
CSN: [id number]     Arrival date & time 03/25/15  1446 History   First MD Initiated Contact with Patient 03/25/15 1451     Chief Complaint  Patient presents with  . Headache  . Dizziness   HPI  [name] [name] is an 81 year old female with PMHx of a fib on coumadin, sinus node dysfunction with pacemaker implanted and CAD presenting with "worst headache ever". Pt was transferred from an UC due to headache symptoms. Pt states that yesterday morning around 11 AM she had sudden onset headache on the top of her head. States she has never had a headache like this before and that it is the worst one she's ever had. Headache associated with nausea and unsteady gait. Pt took tramadol yesterday with no relief. Pt states the headache persisted through yesterday and resolved around noon today. Nausea resolved as well. Pt states she still feels unsteady when she is standing. Denies sensation of room spinning around her or lightheadedness. Denies syncope, head trauma, visual disturbances, chest pain, SOB, abdominal pain, nausea, vomiting, weakness, numbness, or tingling.  Past Medical History  Diagnosis Date  . Persistent atrial fibrillation   . sinus node dysfunction//post termination pauses   . HTN (hypertension)   . CAD (coronary artery disease)     LHC 5/08:  pOM 20%, pRCA 20-30%, EF 60%  . NASH (nonalcoholic steatohepatitis)   . PMR (polymyalgia rheumatica)     no steriods for 5 years  . HLD (hyperlipidemia)   . Neuromuscular disorder   . Hepatitis     hx of medication induced hepatitis (Vytorin per pt)  . Ascending aorta enlargement     4.8 cm per echo 08/07/11; 5.0 by CT in Jan 2013  . Pacemaker-Medtronic 11/12/2011    Implanted 2013   . Hx of echocardiogram     echo 1/13: EF 55%, Gr 2 diast dysfn, Asc Ao aneurysm 4.8 cm, mild MR, mod LAE, mild RAE   Past Surgical History  Procedure Laterality Date  . Umbilical hernia repair  1950's  . Incision and drainage breast abscess  1973  . Cardiac  catheterization    . Hernia repair    . Pacemaker insertion  Jan 2013  . Cardioversion  03/02/2012    Procedure: CARDIOVERSION;  Surgeon: Lelon Perla, MD;  Location: [place];  Service: Cardiovascular;  Laterality: N/A;  . Cardioversion  03/13/2012    Procedure: CARDIOVERSION;  Surgeon: Josue Hector, MD;  Location: [facility] ENDOSCOPY;  Service: Cardiovascular;  Laterality: N/A;  . Cardioversion  04/15/2012    Procedure: CARDIOVERSION;  Surgeon: [name], MD;  Location: [facility] ENDOSCOPY;  Service: Cardiovascular;  Laterality: N/A;  . Cardioversion N/A 06/07/2014    Procedure: CARDIOVERSION;  Surgeon: [name], MD;  Location: [place];  Service: Cardiovascular;  Laterality: N/A;  . Permanent pacemaker insertion N/A 07/29/2011    Procedure: PERMANENT PACEMAKER INSERTION;  Surgeon: [name], MD;  Location: [facility] CATH LAB;  Service: Cardiovascular;  Laterality: N/A;  . Cardioversion N/A 08/18/2014    Procedure: CARDIOVERSION;  Surgeon: [name], MD;  Location: [facility] ENDOSCOPY;  Service: Cardiovascular;  Laterality: N/A;   Family History  Problem Relation Age of Onset  . Coronary artery disease    . Alzheimer's disease     Social History  Substance Use Topics  . Smoking status: Former Smoker    Types: Cigarettes    Quit date: 07/16/1990  . Smokeless tobacco: Never Used  . Alcohol Use: No  OB History    No data available     Review of Systems  Constitutional: Negative for fever and chills.  HENT: Negative for trouble swallowing and voice change.   Eyes: Negative for visual disturbance.  Respiratory: Negative for shortness of breath.   Cardiovascular: Negative for chest pain.  Gastrointestinal: Positive for nausea. Negative for vomiting and abdominal pain.  Musculoskeletal: Positive for gait problem.  Neurological: Positive for dizziness and headaches. Negative for syncope, facial asymmetry, weakness, light-headedness and numbness.      Allergies   Penicillins; Statins; Tylenol; Tape; and Codeine  Home Medications   Prior to Admission medications   Medication Sig Start Date End Date Taking? Authorizing Provider  amiodarone (PACERONE) 200 MG tablet Take 1 tablet (200 mg total) by mouth daily. 08/23/14  Yes Burtis Junes, NP  Calcium Carbonate-Vitamin D (CALTRATE 600+D PO) Take 1 tablet by mouth 2 (two) times daily.   Yes Historical Provider, MD  cetirizine (ZYRTEC) 10 MG tablet Take 10 mg by mouth daily.    Yes Historical Provider, MD  Cholecalciferol (VITAMIN D3) 1000 UNITS tablet Take 1,000 Units by mouth at bedtime.    Yes Historical Provider, MD  furosemide (LASIX) 20 MG tablet Take 2 tablets (40 mg total) by mouth every other day. Patient taking differently: Take 20 mg by mouth daily.  06/21/14  Yes [name], MD  hydrALAZINE (APRESOLINE) 25 MG tablet Take 25 mg by mouth 2 (two) times daily.  03/23/15  Yes Historical Provider, MD  multivitamin [facility]) per tablet Take 1 tablet by mouth at bedtime.    Yes Historical Provider, MD  nadolol (CORGARD) 40 MG tablet TAKE THREE TABLETS BY MOUTH EVERY EVENING 08/29/14  Yes Lendon Colonel, NP  potassium chloride SA (K-DUR,KLOR-CON) 20 MEQ tablet Take 2 tablets (40 mEq total) by mouth daily. Patient taking differently: Take 20 mEq by mouth 2 (two) times daily.  11/29/14  Yes [name], MD  traMADol (ULTRAM) 50 MG tablet Take 50 mg by mouth 2 (two) times daily as needed for moderate pain.   Yes Historical Provider, MD  valsartan (DIOVAN) 320 MG tablet Take 1 tablet (320 mg total) by mouth daily. 02/21/15  Yes [name], MD  warfarin (COUMADIN) 2.5 MG tablet TAKE AS DIRECTED Patient taking differently: 5 mg on Tues and Sat.  2.5 mg all other days every evening 01/23/15  Yes [name], MD  [facility] 625 MG tablet Take 625-1,250 mg by mouth 2 (two) times daily. Take 625 mg every morning and 1250 mg at bedtime. 05/08/12  Yes Historical Provider, MD   BP 145/76 mmHg  Pulse 59   Temp(Src) 98.3 F (36.8 C) (Oral)  Resp 17  Ht 5' 6"  (1.676 m)  Wt 175 lb 7.8 oz (79.6 kg)  BMI 28.34 kg/m2  SpO2 96% Physical Exam  Constitutional: She is oriented to person, place, and time. She appears well-developed and well-nourished. No distress.  HENT:  Head: Normocephalic and atraumatic.  Mouth/Throat: Oropharynx is clear and moist. No oropharyngeal exudate.  Eyes: Conjunctivae and EOM are normal. Pupils are equal, round, and reactive to light.  Neck: Normal range of motion.  Cardiovascular: Normal rate, regular rhythm and normal heart sounds.   No murmur heard. Pulmonary/Chest: Effort normal and breath sounds normal. No respiratory distress. She has no wheezes. She has no rales.  Abdominal: Soft. There is no tenderness. There is no rebound and no guarding.  Musculoskeletal: Normal range of motion.  Moving extremities spontaneously  Neurological: She is alert and oriented to person, place, and time. No cranial nerve deficit.  Cranial nerves 3-12 intact. 5/5 motor strength of upper and lower extremities. Sensation to light touch throughout. Nose to finger and heel to shin normal.  Skin: Skin is warm and dry.  Psychiatric: She has a normal mood and affect. Her behavior is normal.  Nursing note and vitals reviewed.   ED Course  Procedures (including critical care time) Labs Review Labs Reviewed  COMPREHENSIVE METABOLIC PANEL - Abnormal; Notable for the following:    Creatinine, Ser 1.16 (*)    Total Protein 6.2 (*)    Albumin 3.2 (*)    GFR calc non Af Amer 43 (*)    GFR calc Af Amer 50 (*)    All other components within normal limits  PROTIME-INR - Abnormal; Notable for the following:    Prothrombin Time 25.5 (*)    INR 2.35 (*)    All other components within normal limits  MRSA PCR SCREENING  CBC WITH DIFFERENTIAL/PLATELET  APTT  PROTIME-INR    Imaging Review Ct Head Wo Contrast  03/25/2015   CLINICAL DATA:  Headache, dizziness.  EXAM: CT HEAD WITHOUT CONTRAST   TECHNIQUE: Contiguous axial images were obtained from the base of the skull through the vertex without intravenous contrast.  COMPARISON:  CT scan of July 16, 2012.  FINDINGS: Bony calvarium appears intact. Acute intraparenchymal hemorrhage measuring 2.6 x 1.8 cm is noted in the left posterior parietal cortex. Mild amount of surrounding white matter edema is noted. No midline shift is noted. Ventricular size is within normal limits.  IMPRESSION: Acute left posterior parietal intraparenchymal hemorrhage is noted. Critical Value/emergent results were called by telephone at the time of interpretation on 03/25/2015 at 4:54 pm to [facility] , who verbally acknowledged these results.   Electronically Signed   By: Marijo Conception, M.D.   On: 03/25/2015 16:55   I have personally reviewed and evaluated these images and lab results as part of my medical decision-making.   EKG Interpretation None      MDM   Final diagnoses:  Headache     [name] [name], an 81 year old with hx of HTN, a fib and heart failure. She had sudden onset of severe headache yesterday that was unrelieved with tramadol. She's also complaining of dizziness that makes her unsteady on her feet. VSS. Neuro exam non-focal. Head CT showed left intraparenchymal hemorrhage. No midline shift or blood in ventricles. INR is 2.35. Consulted neuro, Dr. Janann Colonel, who will admit pt to neuro ICU.  Josephina Gip, PA-C 03/25/15 1942  Milton Ferguson, MD 03/26/15 1520

## 2015-03-25 NOTE — ED Provider Notes (Signed)
CSN: [id number]     Arrival date & time 03/25/15  1302 History   First MD Initiated Contact with Patient 03/25/15 1317     Chief Complaint  Patient presents with  . Headache   (Consider location/radiation/quality/duration/timing/severity/associated sxs/prior Treatment) Patient is a 81 y.o. female presenting with headaches. The history is provided by the patient and the spouse.  Headache Pain location:  Frontal Quality:  Sharp Onset quality:  Sudden Duration:  1 day Progression:  Improving Chronicity:  New Similar to prior headaches: no   Context comment:  Sudden around 11am , no response to tramadol, continued all day until this am , assoc dizziness and nausea Relieved by:  Prescription medications Associated symptoms: dizziness, nausea and weakness     Past Medical History  Diagnosis Date  . Persistent atrial fibrillation   . sinus node dysfunction//post termination pauses   . HTN (hypertension)   . CAD (coronary artery disease)     LHC 5/08:  pOM 20%, pRCA 20-30%, EF 60%  . NASH (nonalcoholic steatohepatitis)   . PMR (polymyalgia rheumatica)     no steriods for 5 years  . HLD (hyperlipidemia)   . Neuromuscular disorder   . Hepatitis     hx of medication induced hepatitis (Vytorin per pt)  . Ascending aorta enlargement     4.8 cm per echo 08/07/11; 5.0 by CT in Jan 2013  . Pacemaker-Medtronic 11/12/2011    Implanted 2013   . Hx of echocardiogram     echo 1/13: EF 55%, Gr 2 diast dysfn, Asc Ao aneurysm 4.8 cm, mild MR, mod LAE, mild RAE   Past Surgical History  Procedure Laterality Date  . Umbilical hernia repair  1950's  . Incision and drainage breast abscess  1973  . Cardiac catheterization    . Hernia repair    . Pacemaker insertion  Jan 2013  . Cardioversion  03/02/2012    Procedure: CARDIOVERSION;  Surgeon: Lelon Perla, MD;  Location: [place];  Service: Cardiovascular;  Laterality: N/A;  . Cardioversion  03/13/2012    Procedure: CARDIOVERSION;  Surgeon:  Josue Hector, MD;  Location: [facility] ENDOSCOPY;  Service: Cardiovascular;  Laterality: N/A;  . Cardioversion  04/15/2012    Procedure: CARDIOVERSION;  Surgeon: [name], MD;  Location: [facility] ENDOSCOPY;  Service: Cardiovascular;  Laterality: N/A;  . Cardioversion N/A 06/07/2014    Procedure: CARDIOVERSION;  Surgeon: [name], MD;  Location: [place];  Service: Cardiovascular;  Laterality: N/A;  . Permanent pacemaker insertion N/A 07/29/2011    Procedure: PERMANENT PACEMAKER INSERTION;  Surgeon: [name], MD;  Location: [facility] CATH LAB;  Service: Cardiovascular;  Laterality: N/A;  . Cardioversion N/A 08/18/2014    Procedure: CARDIOVERSION;  Surgeon: [name], MD;  Location: [facility] ENDOSCOPY;  Service: Cardiovascular;  Laterality: N/A;   Family History  Problem Relation Age of Onset  . Coronary artery disease    . Alzheimer's disease     Social History  Substance Use Topics  . Smoking status: Former Smoker    Types: Cigarettes    Quit date: 07/16/1990  . Smokeless tobacco: Never Used  . Alcohol Use: No   OB History    No data available     Review of Systems  Gastrointestinal: Positive for nausea.  Neurological: Positive for dizziness, weakness and headaches.  All other systems reviewed and are negative.   Allergies  Penicillins; Statins; Tylenol; and Codeine  Home Medications   Prior to Admission medications   Medication Sig  Start Date End Date Taking? Authorizing Provider  amiodarone (PACERONE) 200 MG tablet Take 1 tablet (200 mg total) by mouth daily. 08/23/14   Burtis Junes, NP  Calcium Carbonate-Vitamin D (CALTRATE 600+D PO) Take 1 tablet by mouth 2 (two) times daily.    Historical Provider, MD  cetirizine (ZYRTEC) 10 MG tablet Take 10 mg by mouth as needed for allergies.     Historical Provider, MD  Cholecalciferol (VITAMIN D3) 1000 UNITS tablet Take 1,000 Units by mouth daily.      Historical Provider, MD  diclofenac sodium (VOLTAREN) 1 % GEL Apply 2 g  topically daily as needed (for pain).    Historical Provider, MD  furosemide (LASIX) 20 MG tablet Take 2 tablets (40 mg total) by mouth every other day. Patient taking differently: Take 20 mg by mouth daily.  06/21/14   [name], MD  ipratropium (ATROVENT) 0.03 % nasal spray Place 2 sprays into both nostrils daily as needed for rhinitis.    Historical Provider, MD  multivitamin [facility]) per tablet Take 1 tablet by mouth daily.      Historical Provider, MD  nadolol (CORGARD) 40 MG tablet TAKE THREE TABLETS BY MOUTH EVERY EVENING 08/29/14   Lendon Colonel, NP  potassium chloride SA (K-DUR,KLOR-CON) 20 MEQ tablet Take 2 tablets (40 mEq total) by mouth daily. 11/29/14   [name], MD  valsartan (DIOVAN) 320 MG tablet Take 1 tablet (320 mg total) by mouth daily. 02/21/15   [name], MD  warfarin (COUMADIN) 2.5 MG tablet TAKE AS DIRECTED 01/23/15   [name], MD  [facility] 625 MG tablet Take 625-1,250 mg by mouth 2 (two) times daily. Take 625 mg every morning and 1250 mg at bedtime. 05/08/12   Historical Provider, MD   Meds Ordered and Administered this Visit   Medications  0.9 %  sodium chloride infusion (not administered)    BP 161/86 mmHg  Pulse 80  Temp(Src) 97.9 F (36.6 C) (Oral)  Resp 16  SpO2 96% No data found.   Physical Exam  Constitutional: She is oriented to person, place, and time. She appears well-developed and well-nourished. No distress.  Eyes: Conjunctivae and EOM are normal. Pupils are equal, round, and reactive to light.  Neck: Normal range of motion. Neck supple.  Cardiovascular: Normal heart sounds.   Pulmonary/Chest: Breath sounds normal.  Musculoskeletal: Normal range of motion.  Neurological: She is alert and oriented to person, place, and time.  Skin: Skin is warm and dry.  Nursing note and vitals reviewed.   ED Course  Procedures (including critical care time)  Labs Review Labs Reviewed - No data to display  Imaging Review No  results found.   Visual Acuity Review  Right Eye Distance:   Left Eye Distance:   Bilateral Distance:    Right Eye Near:   Left Eye Near:    Bilateral Near:         MDM   1. Worst headache of life    Sent for eval of sudden HA top of head yest , on coumadin, now with sl dizziness and nausea.    Billy Fischer, MD 03/25/15 [phone]

## 2015-03-25 NOTE — ED Notes (Addendum)
  Requested vit k and kcentra from pharmacy.  In process.

## 2015-03-26 ENCOUNTER — Inpatient Hospital Stay (HOSPITAL_COMMUNITY): Payer: Medicare Other

## 2015-03-26 ENCOUNTER — Other Ambulatory Visit: Payer: Self-pay | Admitting: Adult Health

## 2015-03-26 DIAGNOSIS — I611 Nontraumatic intracerebral hemorrhage in hemisphere, cortical: Secondary | ICD-10-CM | POA: Diagnosis not present

## 2015-03-26 LAB — CBC
HEMATOCRIT: 38.9 % (ref 36.0–46.0)
Hemoglobin: 12.8 g/dL (ref 12.0–15.0)
MCH: 31.3 pg (ref 26.0–34.0)
MCHC: 32.9 g/dL (ref 30.0–36.0)
MCV: 95.1 fL (ref 78.0–100.0)
Platelets: 188 10*3/uL (ref 150–400)
RBC: 4.09 MIL/uL (ref 3.87–5.11)
RDW: 14.5 % (ref 11.5–15.5)
WBC: 7 10*3/uL (ref 4.0–10.5)

## 2015-03-26 LAB — BASIC METABOLIC PANEL
Anion gap: 5 (ref 5–15)
BUN: 13 mg/dL (ref 6–20)
CALCIUM: 8.9 mg/dL (ref 8.9–10.3)
CO2: 25 mmol/L (ref 22–32)
CREATININE: 0.84 mg/dL (ref 0.44–1.00)
Chloride: 110 mmol/L (ref 101–111)
GFR calc Af Amer: >60 mL/min (ref >60)
GFR calc non Af Amer: >60 mL/min (ref >60)
GLUCOSE: 118 mg/dL — AB (ref 65–99)
Potassium: 3.8 mmol/L (ref 3.5–5.1)
Sodium: 140 mmol/L (ref 135–145)

## 2015-03-26 LAB — PROTIME-INR
INR: 1.13 (ref 0.00–1.49)
Prothrombin Time: 14.7 seconds (ref 11.6–15.2)

## 2015-03-26 MED ORDER — ONDANSETRON HCL 4 MG/2ML IJ SOLN: 4.0000 mg | Freq: Four times a day (QID) | INTRAMUSCULAR | Status: DC | PRN

## 2015-03-26 MED ORDER — ACETAMINOPHEN 325 MG PO TABS
650.0000 mg | ORAL_TABLET | Freq: Four times a day (QID) | ORAL | Status: DC | PRN
  Administered 2015-03-26 – 2015-03-29 (×2): 650 mg via ORAL
  Filled 2015-03-26 (×2): qty 2

## 2015-03-26 MED ORDER — QUETIAPINE 12.5 MG HALF TABLET
12.5000 mg | ORAL_TABLET | Freq: Two times a day (BID) | ORAL | Status: DC
  Administered 2015-03-26 (×2): 12.5 mg via ORAL
  Filled 2015-03-26 (×4): qty 1

## 2015-03-26 NOTE — Progress Notes (Signed)
~  0540: Pt c/o HA after using bedpan; states 4/10 doesn't request medicine. Talking to pt about course of care. Pt appears to be more anxious about diagnosis. Pt states "I feel like know what I am suppose to do but can not do it". No change in NIH, pupils equal reactive. Pt continues to complain of increase in HA and feeling confused, "Kind of what I felt like before coming in". Remain with pt and educated.  ~3295: Dr. Doy Mince on floor. Discussed with MD about pt's complaints and concerns. Order for Tylenol. ~1884: In room to update Pt. Pt complains of new right side peripheral vision loss and unable to "focus" on an object very long. NIH:1, noted Left pupil slightly larger. 0658: Dr. Doy Mince called and notified of this nurse findings. Orders received.  Day shift nurse updated.

## 2015-03-26 NOTE — Progress Notes (Signed)
Stroke Team Progress Note  HISTORY 81 y.o. female with a history of atrial fibrillation on Coumadin therapy, status post permanent pacemaker (Medtronic) Dr Caryl Comes, hypertension, coronary artery disease, NASH, polymyalgia rheumatica, hyperlipidemia, and an ascending aortic aneurysm followed by Dr. Servando Snare. Yesterday the patient had just gotten out of the shower when she noticed an odd sort of headache on the top of her head. She doesn't normally have headaches. She took tramadol with some relief. The headache continued for most of the day. She slept well that night but when she got up today she still had a headache. She took another tramadol and felt somewhat drowsy, tired, and mildly dizzy. She states her blood pressure has been running high lately. She checks it at home. She has been getting systolics in the 388-828 range.  Pt was found to have  acute intraparenchymal hemorrhage measuring 2.6 x 1.8 cm is noted in the left posterior parietal cortex. INR on admission 2.35 s/p reversal now at 1.13    SUBJECTIVE NIHSS is 0 today. Slightly agitated.    OBJECTIVE Most recent Vital Signs: Filed Vitals:   03/26/15 0700 03/26/15 0758 03/26/15 0800 03/26/15 0900  BP: 170/76  138/79 136/63  Pulse: 57  83 57  Temp:  97.6 F (36.4 C)    TempSrc:  Oral    Resp: 19  19 13   Height:      Weight:      SpO2: 94%  97% 99%   CBG (last 3)  No results for input(s): GLUCAP in the last 72 hours.  IV Fluid Intake:     MEDICATIONS  . Influenza vac split quadrivalent PF  0.5 mL Intramuscular Tomorrow-1000  . pantoprazole (PROTONIX) IV  40 mg Intravenous QHS  . QUEtiapine  12.5 mg Oral BID  . senna-docusate  1 tablet Oral BID   PRN:  acetaminophen, labetalol, ondansetron (ZOFRAN) IV  Diet:  Diet Heart Room service appropriate?: Yes; Fluid consistency:: Thin  Activity:  Up with assistance DVT Prophylaxis:  SCDs  CLINICALLY SIGNIFICANT STUDIES Basic Metabolic Panel:  Recent Labs Lab 03/25/15 1600  NA  137  K 3.9  CL 106  CO2 24  GLUCOSE 97  BUN 14  CREATININE 1.16*  CALCIUM 8.9   Liver Function Tests:  Recent Labs Lab 03/25/15 1600  AST 24  ALT 24  ALKPHOS 60  BILITOT 1.1  PROT 6.2*  ALBUMIN 3.2*   CBC:  Recent Labs Lab 03/25/15 1600  WBC 6.2  NEUTROABS 4.3  HGB 13.3  HCT 40.9  MCV 95.8  PLT 213   Coagulation:  Recent Labs Lab 03/25/15 1600 03/25/15 1950 03/26/15 0232  LABPROT 25.5* 15.1 14.7  INR 2.35* 1.17 1.13   Cardiac Enzymes: No results for input(s): CKTOTAL, CKMB, CKMBINDEX, TROPONINI in the last 168 hours. Urinalysis: No results for input(s): COLORURINE, LABSPEC, PHURINE, GLUCOSEU, HGBUR, BILIRUBINUR, KETONESUR, PROTEINUR, UROBILINOGEN, NITRITE, LEUKOCYTESUR in the last 168 hours.  Invalid input(s): APPERANCEUR Lipid PanelNo results found for: CHOL, TRIG, HDL, CHOLHDL, VLDL, LDLCALC HgbA1C No results found for: HGBA1C  Urine Drug Screen:  No results found for: LABOPIA, COCAINSCRNUR, LABBENZ, AMPHETMU, THCU, LABBARB  Alcohol Level: No results for input(s): ETH in the last 168 hours.  Ct Head Wo Contrast  03/26/2015   CLINICAL DATA:  Visual abnormalities, recent intracranial hemorrhage  EXAM: CT HEAD WITHOUT CONTRAST  TECHNIQUE: Contiguous axial images were obtained from the base of the skull through the vertex without intravenous contrast.  COMPARISON:  03/26/2015  FINDINGS: Left occipital intraparenchymal hemorrhage  with associated edema is reidentified measuring 2.6 x 1.4 cm image 21. Stable degree of cortical volume loss diffusely. No new hemorrhage, mass, or infarct. Orbits and paranasal sinuses are grossly unremarkable. No skull fracture.  IMPRESSION: Slight interval decrease in size of left occipital intraparenchymal hemorrhage. No new acute intracranial finding.   Electronically Signed   By: Conchita Paris M.D.   On: 03/26/2015 08:07   Ct Head Wo Contrast  03/26/2015   CLINICAL DATA:  Followup intracranial hemorrhage. Atrial fibrillation.  Pacemaker. Hypertension. Headache with dizziness. No neurologic deficit reported.  EXAM: CT HEAD WITHOUT CONTRAST  TECHNIQUE: Contiguous axial images were obtained from the base of the skull through the vertex without intravenous contrast.  COMPARISON:  CT scan performed yesterday.  FINDINGS: Acute LEFT parietal intra-axial brain hemorrhage again noted with mild surrounding edema. Cross-section today of the hematoma is 16 x 27 mm, stable. The lesion is superficial and parasagittal in location. Is unclear if this represents an embolic infarction with hemorrhagic transformation or primary intracerebral hematoma. If it does represent an embolic infarct from the patient's atrial fibrillation, it is unclear if this represents a watershed insult or lies primarily within the LEFT PCA territory.No significant increase from yesterday's CT. Hemorrhage related to venous thrombosis with infarction is unlikely given the patient's anticoagulation. No new areas of hemorrhage.  Normal for age cerebral volume. Mild white matter disease. Remote RIGHT cerebellar infarct. Vascular calcification. Calvarium intact. No sinus or mastoid disease of significance. No scalp signs of occult trauma.  IMPRESSION: Stable LEFT parietal parasagittal intraparenchymal hematoma as described. No significant increase in hematoma or edema compared with yesterday's CT.   Electronically Signed   By: Staci Righter M.D.   On: 03/26/2015 07:33   Ct Head Wo Contrast  03/25/2015   CLINICAL DATA:  Headache, dizziness.  EXAM: CT HEAD WITHOUT CONTRAST  TECHNIQUE: Contiguous axial images were obtained from the base of the skull through the vertex without intravenous contrast.  COMPARISON:  CT scan of July 16, 2012.  FINDINGS: Bony calvarium appears intact. Acute intraparenchymal hemorrhage measuring 2.6 x 1.8 cm is noted in the left posterior parietal cortex. Mild amount of surrounding white matter edema is noted. No midline shift is noted. Ventricular size is  within normal limits.  IMPRESSION: Acute left posterior parietal intraparenchymal hemorrhage is noted. Critical Value/emergent results were called by telephone at the time of interpretation on 03/25/2015 at 4:54 pm to [facility] , who verbally acknowledged these results.   Electronically Signed   By: Marijo Conception, M.D.   On: 03/25/2015 16:55    CT of the brain  Stable ctH repeat Left occipital intraparenchymal hemorrhage with associated edema is reidentified measuring 2.6 x 1.4 cm image 21  MRI of the brain    MRA of the brain    Carotid Doppler  Pending   2D Echocardiogram  Pending   CXR    EKG  A-fib, irreg irreg. . For complete results please see formal report.   Therapy Recommendations pending   Physical Exam   general - pleasant but occasionally tearful 81 year old female in no acute distress. Heart - Regular rate and rhythm - no murmer appreciated Lungs - Clear to auscultation Abdomen - Soft - non tender Extremities - Distal pulses intact - no edema Skin - Warm and dry  Mental Status: Alert, oriented, thought content appropriate. Speech fluent without evidence of aphasia. Able to follow 3 step commands without difficulty. Cranial Nerves: II: Discs not visualized; Visual fields grossly  normal, pupils equal, round, reactive to light and accommodation III,IV, VI: ptosis not present, extra-ocular motions intact bilaterally V,VII: smile symmetric, facial light touch sensation normal bilaterally VIII: hearing normal bilaterally IX,X: gag reflex present XI: bilateral shoulder shrug XII: midline tongue extension Motor: Right :Upper extremity 5/5Left: Upper extremity 5/5 Lower extremity 5/5Lower extremity 5/5 Tone and bulk:normal tone throughout; no atrophy noted Sensory: Light touch intact throughout, bilaterally except the patient seemed uncertain about her  left upper extremity. Possibly mildly decreased sensation to light touch. Deep Tendon Reflexes: 2+ and symmetric throughout Plantars: Right: downgoingLeft: downgoing Cerebellar: normal finger-to-nose, normal rapid alternating movements and normal heel-to-shin test Gait: Deferred  ASSESSMENT  Ms. RONIQUE SIMERLY is a 81 y.o. female a-fib, ppm on coumadin with L parietal [facility] day # 1  TREATMENT/PLAN  S/p reversal   BP well controlled  Slight agitation, started seroquel 12.5 BID PRN  Keep in ICU for today  Pt/OT  Passed for diet  OOB to chair   S/p discussion with family at bedside. Made aware the higher risk of increased risk of stroke due to A-fib and not being anticoagulated.    Creat 1.16 appears baseline around 1. Hydration  Repeat labs today Leotis Pain     SIGNED    To contact Stroke Continuity provider, please refer to [email or web address] After hours, contact General Neurology

## 2015-03-27 ENCOUNTER — Ambulatory Visit (HOSPITAL_COMMUNITY): Payer: Medicare Other

## 2015-03-27 ENCOUNTER — Encounter (HOSPITAL_COMMUNITY): Payer: Medicare Other

## 2015-03-27 DIAGNOSIS — Z950 Presence of cardiac pacemaker: Secondary | ICD-10-CM

## 2015-03-27 DIAGNOSIS — Z7901 Long term (current) use of anticoagulants: Secondary | ICD-10-CM

## 2015-03-27 DIAGNOSIS — I611 Nontraumatic intracerebral hemorrhage in hemisphere, cortical: Principal | ICD-10-CM

## 2015-03-27 DIAGNOSIS — I10 Essential (primary) hypertension: Secondary | ICD-10-CM

## 2015-03-27 DIAGNOSIS — I480 Paroxysmal atrial fibrillation: Secondary | ICD-10-CM

## 2015-03-27 LAB — LIPID PANEL
Cholesterol: 181 mg/dL (ref 0–200)
HDL: 64 mg/dL (ref >40)
LDL CALC: 98 mg/dL (ref 0–99)
TRIGLYCERIDES: 96 mg/dL (ref <150)
Total CHOL/HDL Ratio: 2.8 RATIO
VLDL: 19 mg/dL (ref 0–40)

## 2015-03-27 LAB — BASIC METABOLIC PANEL
Anion gap: 5 (ref 5–15)
BUN: 12 mg/dL (ref 6–20)
CALCIUM: 8.8 mg/dL — AB (ref 8.9–10.3)
CO2: 25 mmol/L (ref 22–32)
CREATININE: 1.02 mg/dL — AB (ref 0.44–1.00)
Chloride: 110 mmol/L (ref 101–111)
GFR calc Af Amer: 58 mL/min — ABNORMAL LOW (ref >60)
GFR, EST NON AFRICAN AMERICAN: 50 mL/min — AB (ref >60)
GLUCOSE: 102 mg/dL — AB (ref 65–99)
Potassium: 3.9 mmol/L (ref 3.5–5.1)
SODIUM: 140 mmol/L (ref 135–145)

## 2015-03-27 LAB — CBC
HCT: 37.4 % (ref 36.0–46.0)
Hemoglobin: 12.2 g/dL (ref 12.0–15.0)
MCH: 31.1 pg (ref 26.0–34.0)
MCHC: 32.6 g/dL (ref 30.0–36.0)
MCV: 95.4 fL (ref 78.0–100.0)
PLATELETS: 192 10*3/uL (ref 150–400)
RBC: 3.92 MIL/uL (ref 3.87–5.11)
RDW: 15 % (ref 11.5–15.5)
WBC: 6.8 10*3/uL (ref 4.0–10.5)

## 2015-03-27 MED ORDER — AMIODARONE HCL 200 MG PO TABS
200.0000 mg | ORAL_TABLET | Freq: Every day | ORAL | Status: DC
  Administered 2015-03-27 – 2015-03-29 (×3): 200 mg via ORAL
  Filled 2015-03-27 (×3): qty 1

## 2015-03-27 MED ORDER — NADOLOL 40 MG PO TABS
40.0000 mg | ORAL_TABLET | Freq: Every day | ORAL | Status: DC
  Administered 2015-03-27: 40 mg via ORAL
  Filled 2015-03-27 (×2): qty 1

## 2015-03-27 MED ORDER — LABETALOL HCL 5 MG/ML IV SOLN: 10.0000 mg | INTRAVENOUS | Status: DC | PRN

## 2015-03-27 MED ORDER — QUETIAPINE FUMARATE 25 MG PO TABS
12.5000 mg | ORAL_TABLET | Freq: Every day | ORAL | Status: DC
  Administered 2015-03-28: 12.5 mg via ORAL
  Filled 2015-03-27 (×3): qty 1

## 2015-03-27 MED ORDER — BACITRACIN-NEOMYCIN-POLYMYXIN OINTMENT TUBE
TOPICAL_OINTMENT | Freq: Every day | CUTANEOUS | Status: DC
  Administered 2015-03-27: 11:00:00 via TOPICAL
  Filled 2015-03-27: qty 15

## 2015-03-27 MED ORDER — PANTOPRAZOLE SODIUM 40 MG PO TBEC
40.0000 mg | DELAYED_RELEASE_TABLET | Freq: Every day | ORAL | Status: DC
  Administered 2015-03-27 – 2015-03-29 (×3): 40 mg via ORAL
  Filled 2015-03-27 (×3): qty 1

## 2015-03-27 NOTE — Progress Notes (Signed)
Patient transferred to room 5M07 from 64M. She is a/o at this time, denied pain. Safety precautions and orders reviewed with patient. TELE applied and confirmed. No other distress noted. Will continue to monitor.   Ave Filter, RN

## 2015-03-27 NOTE — Progress Notes (Signed)
Stroke Team Progress Note  SUBJECTIVE Pt is doing well. No family at bedside. Talked with daughter over the phone and updated her about our plan. Transfer to floor today.     OBJECTIVE Most recent Vital Signs: Filed Vitals:   03/27/15 1110 03/27/15 1200 03/27/15 1300 03/27/15 1439  BP: 102/80 124/48 120/52 129/60  Pulse:  62 60 64  Temp:    98.1 F (36.7 C)  TempSrc:    Oral  Resp: 17 15 23 16   Height:      Weight:      SpO2: 98% 100% 99% 99%   CBG (last 3)  No results for input(s): GLUCAP in the last 72 hours.  IV Fluid Intake:     MEDICATIONS  . amiodarone  200 mg Oral Daily  . neomycin-bacitracin-polymyxin   Topical Daily  . pantoprazole  40 mg Oral QAC breakfast  . QUEtiapine  12.5 mg Oral QHS  . senna-docusate  1 tablet Oral BID   PRN:  acetaminophen, labetalol, ondansetron (ZOFRAN) IV  Diet:  Diet Heart Room service appropriate?: Yes; Fluid consistency:: Thin  Activity:  Up with assistance DVT Prophylaxis:  SCDs  CLINICALLY SIGNIFICANT STUDIES Basic Metabolic Panel:   Recent Labs Lab 03/26/15 1040 03/27/15 0234  NA 140 140  K 3.8 3.9  CL 110 110  CO2 25 25  GLUCOSE 118* 102*  BUN 13 12  CREATININE 0.84 1.02*  CALCIUM 8.9 8.8*   Liver Function Tests:   Recent Labs Lab 03/25/15 1600  AST 24  ALT 24  ALKPHOS 60  BILITOT 1.1  PROT 6.2*  ALBUMIN 3.2*   CBC:   Recent Labs Lab 03/25/15 1600 03/26/15 1040 03/27/15 0234  WBC 6.2 7.0 6.8  NEUTROABS 4.3  --   --   HGB 13.3 12.8 12.2  HCT 40.9 38.9 37.4  MCV 95.8 95.1 95.4  PLT 213 188 192   Coagulation:   Recent Labs Lab 03/25/15 1600 03/25/15 1950 03/26/15 0232  LABPROT 25.5* 15.1 14.7  INR 2.35* 1.17 1.13   Cardiac Enzymes: No results for input(s): CKTOTAL, CKMB, CKMBINDEX, TROPONINI in the last 168 hours. Urinalysis: No results for input(s): COLORURINE, LABSPEC, PHURINE, GLUCOSEU, HGBUR, BILIRUBINUR, KETONESUR, PROTEINUR, UROBILINOGEN, NITRITE, LEUKOCYTESUR in the last 168  hours.  Invalid input(s): APPERANCEUR Lipid PanelNo results found for: CHOL, TRIG, HDL, CHOLHDL, VLDL, LDLCALC HgbA1C No results found for: HGBA1C  Urine Drug Screen:  No results found for: LABOPIA, COCAINSCRNUR, LABBENZ, AMPHETMU, THCU, LABBARB  Alcohol Level: No results for input(s): ETH in the last 168 hours.  Ct Head Wo Contrast  03/26/2015   IMPRESSION: Slight interval decrease in size of left occipital intraparenchymal hemorrhage. No new acute intracranial finding.      03/26/2015   IMPRESSION: Stable LEFT parietal parasagittal intraparenchymal hematoma as described. No significant increase in hematoma or edema compared with yesterday's CT.     03/25/2015    IMPRESSION: Acute left posterior parietal intraparenchymal hemorrhage is noted.   2D Echocardiogram  Pending   Physical Exam   general - pleasant but occasionally tearful 81 year old female in no acute distress. Heart - Regular rate and rhythm, no afib rhythm heard Lungs - Clear to auscultation Abdomen - Soft - non tender Extremities - Distal pulses intact - no edema Skin - Warm and dry  Mental Status: Alert, oriented, thought content appropriate. Speech fluent without evidence of aphasia. Able to follow 3 step commands without difficulty. Cranial Nerves: II: Discs not visualized; Visual fields grossly normal, pupils equal, round, reactive  to light and accommodation III,IV, VI: ptosis not present, extra-ocular motions intact bilaterally V,VII: smile symmetric, facial light touch sensation normal bilaterally VIII: hearing normal bilaterally IX,X: gag reflex present XI: bilateral shoulder shrug XII: midline tongue extension Motor: Right :Upper extremity 5/5Left: Upper extremity 5/5 Lower extremity 5/5Lower extremity 5/5 Tone and bulk:normal tone throughout; no atrophy noted Sensory: Light touch intact  throughout, bilaterally except the patient seemed uncertain about her left upper extremity. Possibly mildly decreased sensation to light touch. Deep Tendon Reflexes: 2+ and symmetric throughout Plantars: Right: downgoingLeft: downgoing Cerebellar: normal finger-to-nose, normal rapid alternating movements and normal heel-to-shin test Gait: Deferred  ASSESSMENT [name] is a 81 y.o. female a-fib on coumadin s/p pacemaker, HTN, CAD, HLD with L parietal ICH. INR 2.35, reversed with Kcentra. Symptoms resolved and repeat CT stable.  ICH:  Left posterior parietal ICH, likely due to HTN in the setting of coumadin use.   Resultant resolution of deficit  Repeat CT stable hematoma  2D Echo  Pending  LDL pending  HgbA1c pending  SCDs for VTE prophylaxis  Diet Heart Room service appropriate?: Yes; Fluid consistency:: Thin   warfarin prior to admission, now on no antithrombotic due to [place]  Ongoing aggressive stroke risk factor management  Therapy recommendations:  HH  Disposition:  Pending  pAfib  On coumadin at home  Hold off coumadin due to [place]  May repeat CT in one month and consider NOAC if ICH resolves  On nadolol 136m Qhs at home  So far BP was at low side, will resume nadolol 418mqhs  Resume amiodarone   Hypertension  Home meds:   Nadolol, lasix, diovan BP goal < 160 Currently on nadolol  Stable, on the low side  Patient counseled to be compliant with her blood pressure medications  Hyperlipidemia  Home meds:  none   Currently on none  LDL pending, goal < 70  Other Stroke Risk Factors  Advanced age  Coronary artery disease  Other Active Problems  PMR not on steroids  Other Pertinent History  Agitation on seroquel   Hospital day # 2   JiRosalin HawkingMD PhD Stroke Neurology 03/27/2015 2:52 PM    To contact Stroke Continuity provider, please refer to [email or web address] hours, contact General  Neurology

## 2015-03-27 NOTE — Progress Notes (Signed)
OT Cancellation Note  Patient Details Name: [name] [name] MRN: [id number] DOB: Dec 06, 1930   Cancelled Treatment:    Reason Eval/Treat Not Completed: Other (comment) Current bedrest orders. Please update activity orders when appropriate to begin therapy. Thanks [place], OTR/L  [phone] 03/27/2015 03/27/2015, 8:27 AM

## 2015-03-27 NOTE — Evaluation (Signed)
Speech Language Pathology Evaluation Patient Details Name: [name] [name] MRN: [id number] DOB: 28-Oct-1930 Today's Date: 03/27/2015 Time: 3825-0539 SLP Time Calculation (min) (ACUTE ONLY): 12 min  Problem List:  Patient Active Problem List   Diagnosis Date Noted  . ICH (intracerebral hemorrhage) 03/25/2015  . Encounter for therapeutic drug monitoring 10/04/2013  . Dyspnea on exertion 07/26/2013  . Chest pain 02/04/2013  . Orthostatic lightheadedness 06/25/2012  . Pacemaker-Medtronic 11/12/2011  . Ascending aorta enlargement 08/12/2011  . Chronic diastolic heart failure 76/73/4193  . Long term (current) use of anticoagulants 10/15/2010  . PREMATURE VENTRICULAR CONTRACTIONS 10/17/2009  . post termination pauses/.sinus bradycardia 11/17/2008  . Essential hypertension 04/28/2008  . Atrial fibrillation 04/28/2008   Past Medical History:  Past Medical History  Diagnosis Date  . Persistent atrial fibrillation   . sinus node dysfunction//post termination pauses   . HTN (hypertension)   . CAD (coronary artery disease)     LHC 5/08:  pOM 20%, pRCA 20-30%, EF 60%  . NASH (nonalcoholic steatohepatitis)   . PMR (polymyalgia rheumatica)     no steriods for 5 years  . HLD (hyperlipidemia)   . Neuromuscular disorder   . Hepatitis     hx of medication induced hepatitis (Vytorin per pt)  . Ascending aorta enlargement     4.8 cm per echo 08/07/11; 5.0 by CT in Jan 2013  . Pacemaker-Medtronic 11/12/2011    Implanted 2013   . Hx of echocardiogram     echo 1/13: EF 55%, Gr 2 diast dysfn, Asc Ao aneurysm 4.8 cm, mild MR, mod LAE, mild RAE   Past Surgical History:  Past Surgical History  Procedure Laterality Date  . Umbilical hernia repair  1950's  . Incision and drainage breast abscess  1973  . Cardiac catheterization    . Hernia repair    . Pacemaker insertion  Jan 2013  . Cardioversion  03/02/2012    Procedure: CARDIOVERSION;  Surgeon: Lelon Perla, MD;  Location: [place];   Service: Cardiovascular;  Laterality: N/A;  . Cardioversion  03/13/2012    Procedure: CARDIOVERSION;  Surgeon: Josue Hector, MD;  Location: [facility] ENDOSCOPY;  Service: Cardiovascular;  Laterality: N/A;  . Cardioversion  04/15/2012    Procedure: CARDIOVERSION;  Surgeon: [name], MD;  Location: [facility] ENDOSCOPY;  Service: Cardiovascular;  Laterality: N/A;  . Cardioversion N/A 06/07/2014    Procedure: CARDIOVERSION;  Surgeon: [name], MD;  Location: [place];  Service: Cardiovascular;  Laterality: N/A;  . Permanent pacemaker insertion N/A 07/29/2011    Procedure: PERMANENT PACEMAKER INSERTION;  Surgeon: [name], MD;  Location[facility] CATH LAB;  Service: Cardiovascular;  Laterality: N/A;  . Cardioversion N/A 08/18/2014    Procedure: CARDIOVERSION;  Surgeon: [name], MD;  Location: [facility] ENDOSCOPY;  Service: Cardiovascular;  Laterality: N/A;   HPI:  81 y.o. female with a history of atrial fibrillation on Coumadin therapy, status post permanent pacemaker, HTN, CAD, AAA admitted with HA.  CT acute intraparenchymal hemorrhage measuring 2.6 x 1.8 cm noted in the left posterior parietal cortex.   Assessment / Plan / Recommendation Clinical Impression  Pt participated in language, speech, and cognitive testing, scoring WFL for all subsystems with good insight, no aphasia nor cognitive deficts.  No SLP f/u recommended.  Pt/family agree.     SLP Assessment  Patient does not need any further Speech Lanaguage Pathology Services    Follow Up Recommendations  None    Frequency and Duration  Pertinent Vitals/Pain Pain Assessment: 0-10 Pain Score: 5  Pain Location: head Pain Descriptors / Indicators: Aching Pain Intervention(s): Limited activity within patient's tolerance   SLP Goals     SLP Evaluation Prior Functioning  Cognitive/Linguistic Baseline: Within functional limits Type of Home: House Available Help at Discharge: Family;Available 24 hours/day    Cognition  Overall Cognitive Status: Within Functional Limits for tasks assessed Arousal/Alertness: Awake/alert Orientation Level: Oriented X4 Attention: Selective Selective Attention: Appears intact Memory: Appears intact Awareness: Appears intact Safety/Judgment: Appears intact    Comprehension  Auditory Comprehension Overall Auditory Comprehension: Appears within functional limits for tasks assessed Visual Recognition/Discrimination Discrimination: Within Function Limits Reading Comprehension Reading Status: Within funtional limits    Expression Expression Primary Mode of Expression: Verbal Verbal Expression Overall Verbal Expression: Appears within functional limits for tasks assessed   Oral / Motor Oral Motor/Sensory Function Overall Oral Motor/Sensory Function: Appears within functional limits for tasks assessed Motor Speech Overall Motor Speech: Appears within functional limits for tasks assessed   GO     Juan Quam Laurice 03/27/2015, 11:51 AM

## 2015-03-27 NOTE — Evaluation (Signed)
Physical Therapy Evaluation Patient Details Name: [name] [name] MRN: [id number] DOB: 12/12/1930 Today's Date: 03/27/2015   History of Present Illness  pt is an 81 y/o female who came to the emergency department at [facility] where a CT scan of the head revealed an acute intraparenchymal hemorrhage measuring 2.6 x 1.8 cm is noted in the left posterior parietal cortex. Mild amount of surrounding white matter edema is noted. No midline shift is noted. Ventricular size is within normal limits. The patient is somewhat anxious and upset but is otherwise in no acute distress. Her physical exam is essentially normal.  Transfered to ICU for close monitoring.  Clinical Impression  Pt admitted with/for a parenchymal bleed with no midline shift a few physical symptoms.  Pt currently limited functionally due to the problems listed below.  (see problems list.)  Pt will benefit from PT to maximize function and safety to be able to get home safely with available assist of family.     Follow Up Recommendations Home health PT;Supervision/Assistance - 24 hour    Equipment Recommendations  None recommended by PT    Recommendations for Other Services       Precautions / Restrictions Precautions Precautions: Fall      Mobility  Bed Mobility Overal bed mobility: Needs Assistance Bed Mobility: Supine to Sit     Supine to sit: Supervision     General bed mobility comments: came up without need for assist or rail  Transfers Overall transfer level: Needs assistance Equipment used: 1 person hand held assist Transfers: Sit to/from Stand Sit to Stand: Min assist         General transfer comment: needed minimal assist. pt perceiving unsteadiness.  Ambulation/Gait Ambulation/Gait assistance: Min assist Ambulation Distance (Feet): 130 Feet Assistive device: 1 person hand held assist Gait Pattern/deviations: Step-through pattern Gait velocity: slower Gait velocity interpretation: Below  normal speed for age/gender General Gait Details: mildly unsteady and tentative with gait.  slow and anxious to get back  Stairs            Wheelchair Mobility    Modified Rankin (Stroke Patients Only) Modified Rankin (Stroke Patients Only) Pre-Morbid Rankin Score: No symptoms Modified Rankin: Moderately severe disability     Balance Overall balance assessment: Needs assistance Sitting-balance support: No upper extremity supported Sitting balance-Leahy Scale: Fair     Standing balance support: Single extremity supported;No upper extremity supported Standing balance-Leahy Scale: Fair                               Pertinent Vitals/Pain Pain Assessment: 0-10 Pain Score: 5  Pain Location: head Pain Descriptors / Indicators: Aching Pain Intervention(s): Limited activity within patient's tolerance    Home Living Family/patient expects to be discharged to:: Private residence Living Arrangements: Spouse/significant other Available Help at Discharge: Family;Available 24 hours/day Type of Home: House Home Access: Stairs to enter Entrance Stairs-Rails: None Entrance Stairs-Number of Steps: 1 Home Layout: One level Home Equipment: None      Prior Function Level of Independence: Independent               Hand Dominance        Extremity/Trunk Assessment   Upper Extremity Assessment: Defer to OT evaluation           Lower Extremity Assessment: Overall WFL for tasks assessed (proximal weakness bil)         Communication   Communication: No difficulties  Cognition Arousal/Alertness: Awake/alert Behavior During Therapy: WFL for tasks assessed/performed;Anxious Overall Cognitive Status: Within Functional Limits for tasks assessed                      General Comments      Exercises        Assessment/Plan    PT Assessment Patient needs continued PT services  PT Diagnosis Acute pain;Generalized weakness;Abnormality of gait    PT Problem List Decreased strength;Decreased activity tolerance;Decreased balance;Decreased mobility;Pain  PT Treatment Interventions DME instruction;Gait training;Stair training;Functional mobility training;Therapeutic activities;Patient/family education   PT Goals (Current goals can be found in the Care Plan section) Acute Rehab PT Goals Patient Stated Goal: back independent and doing for myself PT Goal Formulation: With patient Time For Goal Achievement: 04/03/15 Potential to Achieve Goals: Good    Frequency Min 3X/week   Barriers to discharge        Co-evaluation               End of Session   Activity Tolerance: Patient tolerated treatment well;Other (comment) (limited by pt anxiety) Patient left: in chair;with call bell/phone within reach;with family/visitor present Nurse Communication: Mobility status         Time: 1771-1657 PT Time Calculation (min) (ACUTE ONLY): 32 min   Charges:   PT Evaluation $Initial PT Evaluation Tier I: 1 Procedure PT Treatments $Gait Training: 8-22 mins   PT G Codes:        Keani Gotcher, Tessie Fass 03/27/2015, 12:27 PM 03/27/2015  Donnella Sham, [place] [phone]  (pager)

## 2015-03-27 NOTE — Care Management Note (Signed)
Case Management Note  Patient Details  Name: [name] [name] MRN: [id number] Date of Birth: 1931-06-20  Subjective/Objective:    Pt admitted on 03/25/15 with ICH.  PTA, pt independent, lives with spouse.                   Action/Plan: Will follow for discharge planning as pt progresses.  PT/OT evaluations pending.  Expected Discharge Date:                  Expected Discharge Plan:  [place]  In-House Referral:     Discharge planning Services  CM Consult  Post Acute Care Choice:    Choice offered to:     DME Arranged:    DME Agency:     HH Arranged:    [place] Agency:     Status of Service:  In process, will continue to follow  Medicare Important Message Given:    Date Medicare IM Given:    Medicare IM give by:    Date Additional Medicare IM Given:    Additional Medicare Important Message give by:     If discussed at [place] of Stay Meetings, dates discussed:    Additional Comments:  Reinaldo Raddle, RN, BSN  Trauma/Neuro ICU Case Manager [phone]

## 2015-03-28 ENCOUNTER — Encounter (HOSPITAL_COMMUNITY): Payer: Medicare Other

## 2015-03-28 ENCOUNTER — Ambulatory Visit (HOSPITAL_COMMUNITY): Payer: Medicare Other

## 2015-03-28 DIAGNOSIS — I6789 Other cerebrovascular disease: Secondary | ICD-10-CM

## 2015-03-28 DIAGNOSIS — E785 Hyperlipidemia, unspecified: Secondary | ICD-10-CM

## 2015-03-28 LAB — BASIC METABOLIC PANEL
Anion gap: 8 (ref 5–15)
BUN: 14 mg/dL (ref 6–20)
CHLORIDE: 107 mmol/L (ref 101–111)
CO2: 24 mmol/L (ref 22–32)
CREATININE: 0.87 mg/dL (ref 0.44–1.00)
Calcium: 8.9 mg/dL (ref 8.9–10.3)
Glucose, Bld: 97 mg/dL (ref 65–99)
Potassium: 3.9 mmol/L (ref 3.5–5.1)
SODIUM: 139 mmol/L (ref 135–145)

## 2015-03-28 LAB — CBC
HCT: 40.4 % (ref 36.0–46.0)
HEMOGLOBIN: 13.1 g/dL (ref 12.0–15.0)
MCH: 31 pg (ref 26.0–34.0)
MCHC: 32.4 g/dL (ref 30.0–36.0)
MCV: 95.5 fL (ref 78.0–100.0)
Platelets: 192 10*3/uL (ref 150–400)
RBC: 4.23 MIL/uL (ref 3.87–5.11)
RDW: 14.9 % (ref 11.5–15.5)
WBC: 7.2 10*3/uL (ref 4.0–10.5)

## 2015-03-28 LAB — HEMOGLOBIN A1C
HEMOGLOBIN A1C: 5.5 % (ref 4.8–5.6)
MEAN PLASMA GLUCOSE: 111 mg/dL

## 2015-03-28 MED ORDER — HYDRALAZINE HCL 25 MG PO TABS: 25.0000 mg | ORAL_TABLET | Freq: Two times a day (BID) | ORAL | Status: DC

## 2015-03-28 MED ORDER — FUROSEMIDE 20 MG PO TABS: 20.0000 mg | ORAL_TABLET | Freq: Every day | ORAL | Status: DC

## 2015-03-28 MED ORDER — FUROSEMIDE 20 MG PO TABS
20.0000 mg | ORAL_TABLET | Freq: Every day | ORAL | Status: DC
  Administered 2015-03-28 – 2015-03-29 (×2): 20 mg via ORAL
  Filled 2015-03-28 (×2): qty 1

## 2015-03-28 MED ORDER — IRBESARTAN 300 MG PO TABS
300.0000 mg | ORAL_TABLET | Freq: Every day | ORAL | Status: DC
  Administered 2015-03-28 – 2015-03-29 (×2): 300 mg via ORAL
  Filled 2015-03-28 (×2): qty 1

## 2015-03-28 MED ORDER — NADOLOL 40 MG PO TABS
120.0000 mg | ORAL_TABLET | Freq: Every day | ORAL | Status: DC
  Administered 2015-03-28: 120 mg via ORAL
  Filled 2015-03-28 (×2): qty 1

## 2015-03-28 MED ORDER — IRBESARTAN 300 MG PO TABS: 300.0000 mg | ORAL_TABLET | Freq: Every day | ORAL | Status: DC

## 2015-03-28 MED ORDER — NADOLOL 80 MG PO TABS
80.0000 mg | ORAL_TABLET | Freq: Every day | ORAL | Status: DC
  Filled 2015-03-28: qty 1

## 2015-03-28 NOTE — Care Management Note (Signed)
Case Management Note  Patient Details  Name: [name] [name] MRN: [id number] Date of Birth: [birth year]  Subjective/Objective:                    Action/Plan: CM met with patient and her husband at the bedside. Pt has orders for Home health PT/ OT. Patient given [facility] list and she chose [facility]. Miranda with [facility] notified and referral accepted.  Expected Discharge Date:                  Expected Discharge Plan:  [place]  In-House Referral:     Discharge planning Services  CM Consult  Post Acute Care Choice:    Choice offered to:  Patient  DME Arranged:    DME Agency:     HH Arranged:  PT, OT HH Agency:  [place]  Status of Service:  In process, will continue to follow  Medicare Important Message Given:  Yes-second notification given Date Medicare IM Given:    Medicare IM give by:    Date Additional Medicare IM Given:    Additional Medicare Important Message give by:     If discussed at [place] of Stay Meetings, dates discussed:    Additional Comments:  Ollen Gross, RN 03/28/2015, 4:00 PM

## 2015-03-28 NOTE — Progress Notes (Signed)
Occupational Therapy Evaluation Patient Details Name: [name] [name] MRN: [id number] DOB: 1930-08-20 Today's Date: 03/28/2015    History of Present Illness pt is an 81 y/o female who came to the emergency department at [facility] where a CT scan of the head revealed an acute intraparenchymal hemorrhage measuring 2.6 x 1.8 cm is noted in the left posterior parietal cortex. Mild amount of surrounding white matter edema is noted. No midline shift is noted. Ventricular size is within normal limits. The patient is somewhat anxious and upset but is otherwise in no acute distress. Her physical exam is essentially normal.  Transfered to ICU for close monitoring.   Clinical Impression   Pt admitted with the above diagnoses and presents with below problem list. Pt will benefit from continued OT to address the below listed deficits and maximize independence with BADLs prior to d/c home with family. PTA pt was independent with ADLs. Pt presents with impaired standing balance, decreased activity tolerance, and some right visual field loss. Pt is currently min guard for OOB/LB ADLs and functional mobility. OT to continue to follow acutely.      Follow Up Recommendations  Supervision/Assistance - 24 hour;Home health OT    Equipment Recommendations  Tub/shower seat    Recommendations for Other Services Other (comment) (f/u with opthamologist for visual field test)     Precautions / Restrictions Precautions Precautions: Fall Restrictions Weight Bearing Restrictions: No      Mobility Bed Mobility Overal bed mobility: Needs Assistance Bed Mobility: Supine to Sit;Sit to Supine     Supine to sit: Supervision;HOB elevated Sit to supine: Supervision   General bed mobility comments: Pt reports she sleeps in reclined position at baseline due to dizziness in supine. Extra time and use of rails.   Transfers Overall transfer level: Needs assistance Equipment used: 1 person hand held  assist Transfers: Sit to/from Stand Sit to Stand: Min assist         General transfer comment: HHA due to unsteadiness during inital stand from EOB. Pt sat about 5 minutes EOB until initally dizziness subsided.     Balance Overall balance assessment: Needs assistance Sitting-balance support: Feet supported Sitting balance-Leahy Scale: Fair Sitting balance - Comments: single extremity support during weightshifting in preparation to don socks   Standing balance support: Single extremity supported;During functional activity Standing balance-Leahy Scale: Fair Standing balance comment: Pt noted to seek external support OOB/mobility                            ADL Overall ADL's : Needs assistance/impaired Eating/Feeding: Set up;Sitting   Grooming: Min guard;Wash/dry hands;Brushing hair;Standing   Upper Body Bathing: Set up;Sitting   Lower Body Bathing: Min guard;Sit to/from stand   Upper Body Dressing : Set up;Sitting   Lower Body Dressing: Min guard;Sit to/from stand   Toilet Transfer: Min guard;Ambulation;Comfort height toilet   Toileting- Clothing Manipulation and Hygiene: Min guard;Sit to/from stand   Tub/ Shower Transfer: Walk-in shower;Min guard;Ambulation;3 in 1   Functional mobility during ADLs: Min guard General ADL Comments: Pt noted to use external support during functional mobility. Pt apprehensive about OOB/mobility due to initial "wooziness" EOB. Pt completed toilet transfer, grooming and household distance functinoal mobility as detailed below. Discussed fall prevention with pt and family.      Vision     Perception     Praxis      Pertinent Vitals/Pain Pain Assessment: 0-10 Pain Score: 3  Pain Location:  anterior head Pain Descriptors / Indicators: Aching;Constant Pain Intervention(s): Limited activity within patient's tolerance;Monitored during session;Repositioned     Hand Dominance Right   Extremity/Trunk Assessment Upper Extremity  Assessment Upper Extremity Assessment: Overall WFL for tasks assessed   Lower Extremity Assessment Lower Extremity Assessment: Defer to PT evaluation       Communication Communication Communication: No difficulties   Cognition Arousal/Alertness: Awake/alert Behavior During Therapy: WFL for tasks assessed/performed;Anxious Overall Cognitive Status: Within Functional Limits for tasks assessed                     General Comments    Pt with initial "wooziness" EOB with pt stating, "I'm not sure if I should get out of bed." Pulse-ox vitals assessed: EOB O2 98, bpm 83. Pt sat about 5 minutes and reported "wooziness" subsuded. After completing household distance functional mobility pt reported, "I feel a thickness in my head (pointing to anterior head)."     Exercises       Shoulder Instructions      Home Living Family/patient expects to be discharged to:: Private residence Living Arrangements: Spouse/significant other Available Help at Discharge: Family;Available 24 hours/day Type of Home: House Home Access: Stairs to enter [employer] of Steps: 1 Entrance Stairs-Rails: None Home Layout: One level     Bathroom Shower/Tub: Tub/shower unit;Walk-in shower   Bathroom Toilet: Handicapped height     Home Equipment: None          Prior Functioning/Environment Level of Independence: Independent             OT Diagnosis: Other (comment) (impaired balance)   OT Problem List: Decreased activity tolerance;Impaired balance (sitting and/or standing);Impaired vision/perception;Decreased knowledge of use of DME or AE;Decreased knowledge of precautions   OT Treatment/Interventions: Self-care/ADL training;DME and/or AE instruction;Therapeutic activities;Patient/family education;Balance training    OT Goals(Current goals can be found in the care plan section) Acute Rehab OT Goals Patient Stated Goal: back independent and doing for myself OT Goal Formulation:  With patient/family Time For Goal Achievement: 04/11/15 Potential to Achieve Goals: Good ADL Goals Pt Will Perform Grooming: with modified independence;standing Pt Will Transfer to Toilet: with modified independence;ambulating (comfort height toilet) Pt Will Perform Tub/Shower Transfer: Shower transfer;with modified independence;ambulating;shower seat  OT Frequency: Min 2X/week   Barriers to D/C:            Co-evaluation              End of Session Equipment Utilized During Treatment: Gait belt  Activity Tolerance: Patient tolerated treatment well;Other (comment) (dizziness with mobility) Patient left: in bed;with call bell/phone within reach;with bed alarm set;with family/visitor present;with SCD's reapplied   Time: 6948-5462 OT Time Calculation (min): 32 min Charges:  OT General Charges $OT Visit: 1 Procedure OT Evaluation $Initial OT Evaluation Tier I: 1 Procedure OT Treatments $Self Care/Home Management : 8-22 mins G-Codes:    [name] April 23, 2015, 11:35 AM

## 2015-03-28 NOTE — Progress Notes (Signed)
Stroke Team Progress Note  SUBJECTIVE Pt is doing well. Husband and son are at bedside. BP elevated and will need resume part of home BP meds. No acute issues overnight.     OBJECTIVE Most recent Vital Signs: Filed Vitals:   03/28/15 0135 03/28/15 0526 03/28/15 0914 03/28/15 1721  BP: 142/56 127/60 149/55 147/75  Pulse: 60 59 65 62  Temp: 98.1 F (36.7 C) 98.1 F (36.7 C) 98.4 F (36.9 C) 98.4 F (36.9 C)  TempSrc: Oral Oral Oral Oral  Resp: 18 18 20 18   Height:      Weight:      SpO2: 96% 98% 100% 98%   CBG (last 3)  No results for input(s): GLUCAP in the last 72 hours.  IV Fluid Intake:     MEDICATIONS  . amiodarone  200 mg Oral Daily  . furosemide  20 mg Oral Daily  . irbesartan  300 mg Oral Daily  . nadolol  80 mg Oral QHS  . neomycin-bacitracin-polymyxin   Topical Daily  . pantoprazole  40 mg Oral QAC breakfast  . QUEtiapine  12.5 mg Oral QHS  . senna-docusate  1 tablet Oral BID   PRN:  acetaminophen, labetalol, ondansetron (ZOFRAN) IV  Diet:  Diet Heart Room service appropriate?: Yes; Fluid consistency:: Thin  Activity:  Up with assistance DVT Prophylaxis:  SCDs  CLINICALLY SIGNIFICANT STUDIES Basic Metabolic Panel:   Recent Labs Lab 03/27/15 0234 03/28/15 0647  NA 140 139  K 3.9 3.9  CL 110 107  CO2 25 24  GLUCOSE 102* 97  BUN 12 14  CREATININE 1.02* 0.87  CALCIUM 8.8* 8.9   Liver Function Tests:   Recent Labs Lab 03/25/15 1600  AST 24  ALT 24  ALKPHOS 60  BILITOT 1.1  PROT 6.2*  ALBUMIN 3.2*   CBC:   Recent Labs Lab 03/25/15 1600  03/27/15 0234 03/28/15 0647  WBC 6.2  < > 6.8 7.2  NEUTROABS 4.3  --   --   --   HGB 13.3  < > 12.2 13.1  HCT 40.9  < > 37.4 40.4  MCV 95.8  < > 95.4 95.5  PLT 213  < > 192 192  < > = values in this interval not displayed. Coagulation:   Recent Labs Lab 03/25/15 1600 03/25/15 1950 03/26/15 0232  LABPROT 25.5* 15.1 14.7  INR 2.35* 1.17 1.13   Cardiac Enzymes: No results for input(s):  CKTOTAL, CKMB, CKMBINDEX, TROPONINI in the last 168 hours. Urinalysis: No results for input(s): COLORURINE, LABSPEC, PHURINE, GLUCOSEU, HGBUR, BILIRUBINUR, KETONESUR, PROTEINUR, UROBILINOGEN, NITRITE, LEUKOCYTESUR in the last 168 hours.  Invalid input(s): APPERANCEUR Lipid Panel    Component Value Date/Time   CHOL 181 03/27/2015 1548   TRIG 96 03/27/2015 1548   HDL 64 03/27/2015 1548   CHOLHDL 2.8 03/27/2015 1548   VLDL 19 03/27/2015 1548   LDLCALC 98 03/27/2015 1548   HgbA1C  Lab Results  Component Value Date   HGBA1C 5.5 03/27/2015    Urine Drug Screen:  No results found for: LABOPIA, COCAINSCRNUR, LABBENZ, AMPHETMU, THCU, LABBARB  Alcohol Level: No results for input(s): ETH in the last 168 hours.  Ct Head Wo Contrast  03/26/2015   IMPRESSION: Slight interval decrease in size of left occipital intraparenchymal hemorrhage. No new acute intracranial finding.      03/26/2015   IMPRESSION: Stable LEFT parietal parasagittal intraparenchymal hematoma as described. No significant increase in hematoma or edema compared with yesterday's CT.     03/25/2015  IMPRESSION: Acute left posterior parietal intraparenchymal hemorrhage is noted.   2D Echocardiogram  Pending   Physical Exam   general - pleasant but occasionally tearful 81 year old female in no acute distress. Heart - Regular rate and rhythm, no afib rhythm heard Lungs - Clear to auscultation Abdomen - Soft - non tender Extremities - Distal pulses intact - no edema Skin - Warm and dry  Mental Status: Alert, oriented, thought content appropriate. Speech fluent without evidence of aphasia. Able to follow 3 step commands without difficulty. Cranial Nerves: II: Discs not visualized; Visual fields grossly normal, pupils equal, round, reactive to light and accommodation III,IV, VI: ptosis not present, extra-ocular motions intact bilaterally V,VII: smile symmetric, facial light touch sensation normal bilaterally VIII: hearing  normal bilaterally IX,X: gag reflex present XI: bilateral shoulder shrug XII: midline tongue extension Motor: Right :Upper extremity 5/5Left: Upper extremity 5/5 Lower extremity 5/5Lower extremity 5/5 Tone and bulk:normal tone throughout; no atrophy noted Sensory: Light touch intact throughout, bilaterally except the patient seemed uncertain about her left upper extremity. Possibly mildly decreased sensation to light touch. Deep Tendon Reflexes: 2+ and symmetric throughout Plantars: Right: downgoingLeft: downgoing Cerebellar: normal finger-to-nose, normal rapid alternating movements and normal heel-to-shin test Gait: Deferred  ASSESSMENT [name] is a 81 y.o. female a-fib on coumadin s/p pacemaker, HTN, CAD, HLD with L parietal ICH. INR 2.35, reversed with Kcentra. Symptoms resolved and repeat CT stable.  ICH:  Left posterior parietal ICH, likely due to HTN in the setting of coumadin use.   Resultant resolution of deficit  Repeat CT stable hematoma  2D Echo  Pending  LDL 98  HgbA1c 5.5  SCDs for VTE prophylaxis  Diet Heart Room service appropriate?: Yes; Fluid consistency:: Thin   warfarin prior to admission, now on no antithrombotic due to [place]  Ongoing aggressive stroke risk factor management  Therapy recommendations:  HH  Disposition:  Plan for dc tomorrow  pAfib  On coumadin at home  Hold off coumadin due to [place]  May repeat CT in one month and consider NOAC if ICH resolves  On nadolol 138m Qhs at home  Resume nadolol  Resume amiodarone   Hypertension  Home meds:   Nadolol, lasix, diovan, hydralazine BP goal < 160 Currently on nadolol  Resume lasix and diovan and nadolol  Patient counseled to be compliant with her blood pressure medications  Hyperlipidemia  Home meds:  none   Currently on  none  LDL 98, goal < 70  Allergy to statin  Follow up with PCP  Other Stroke Risk Factors  Advanced age  Coronary artery disease  Other Active Problems  PMR not on steroids  Other Pertinent History  Agitation on seroquel   Hospital day # 3  JRosalin Hawking MD PhD Stroke Neurology 03/28/2015 5:34 PM    To contact Stroke Continuity provider, please refer to [email or web address] After hours, contact General Neurology

## 2015-03-28 NOTE — Progress Notes (Signed)
  Echocardiogram 2D Echocardiogram has been performed.  [name] 03/28/2015, 5:06 PM

## 2015-03-28 NOTE — Progress Notes (Signed)
  Echocardiogram 2D Echocardiogram has been performed.  [name] 03/28/2015, 5:13 PM

## 2015-03-28 NOTE — Progress Notes (Addendum)
Physical Therapy Treatment Patient Details Name: [name] [name] MRN: [id number] DOB: 1931-01-15 Today's Date: 03/28/2015    History of Present Illness pt is an 81 y/o female who came to the emergency department at [facility] where a CT scan of the head revealed an acute intraparenchymal hemorrhage measuring 2.6 x 1.8 cm is noted in the left posterior parietal cortex. Mild amount of surrounding white matter edema is noted. No midline shift is noted. Ventricular size is within normal limits. The patient is somewhat anxious and upset but is otherwise in no acute distress. Her physical exam is essentially normal.  Transfered to ICU for close monitoring.    PT Comments    Patient progressing well with mobility. Ambulated with use of RW demonstrating improvements in balance and stability. Educated regarding techniques to continue to improve balance and address deficits. Patient receptive and appreciative. Will continue to see as indicated and progress as tolerated.  Follow Up Recommendations  Home health PT;Supervision/Assistance - 24 hour     Equipment Recommendations  None recommended by PT    Recommendations for Other Services       Precautions / Restrictions Precautions Precautions: Fall Restrictions Weight Bearing Restrictions: No    Mobility  Bed Mobility Overal bed mobility: Needs Assistance Bed Mobility: Supine to Sit;Sit to Supine     Supine to sit: Supervision;HOB elevated Sit to supine: Supervision   General bed mobility comments: Pt reports she sleeps in reclined position at baseline due to dizziness in supine. Extra time and use of rails.   Transfers Overall transfer level: Needs assistance Equipment used: Rolling walker (2 wheeled) Transfers: Sit to/from Stand Sit to Stand: Supervision;Min guard         General transfer comment: VCs for hand placement, patient able to come to standing without physical assist, supervision with use of  RW  Ambulation/Gait Ambulation/Gait assistance: Supervision Ambulation Distance (Feet): 180 Feet Assistive device: Rolling walker (2 wheeled) Gait Pattern/deviations: Step-through pattern Gait velocity: decreased (cues for increased cadence) Gait velocity interpretation: Below normal speed for age/gender General Gait Details: improved mobility and steadiness with use of RW, patient much less anxious and demonstrates no LOB or instability during gait or turns, VCs for positioning within RW and cues for safety when navigating around obstacles and tight enclosed spaces   Stairs            Wheelchair Mobility    Modified Rankin (Stroke Patients Only)       Balance   Sitting-balance support: Feet supported Sitting balance-Leahy Scale: Fair Sitting balance - Comments: single extremity support during weightshifting in preparation to don socks   Standing balance support: Bilateral upper extremity supported Standing balance-Leahy Scale: Fair Standing balance comment: Good use of RW, able to stand statically without assist while therapist adjusted RW                    Cognition Arousal/Alertness: Awake/alert Behavior During Therapy: WFL for tasks assessed/performed;Anxious Overall Cognitive Status: Within Functional Limits for tasks assessed                      Exercises      General Comments General comments (skin integrity, edema, etc.): educated regarding mobility expectations and techniques to improve overall balance      Pertinent Vitals/Pain Pain Assessment: 0-10 Pain Score: 3  Pain Location: headache Pain Descriptors / Indicators: Headache Pain Intervention(s): Monitored during session    Home Living  Prior Function            PT Goals (current goals can now be found in the care plan section) Acute Rehab PT Goals Patient Stated Goal: back independent and doing for myself PT Goal Formulation: With  patient Time For Goal Achievement: 04/03/15 Potential to Achieve Goals: Good Progress towards PT goals: Progressing toward goals    Frequency  Min 3X/week    PT Plan Current plan remains appropriate    Co-evaluation             End of Session Equipment Utilized During Treatment: Gait belt Activity Tolerance: Patient tolerated treatment well Patient left: in chair;with call bell/phone within reach;with family/visitor present     Time: 1540-1600 PT Time Calculation (min) (ACUTE ONLY): 20 min  Charges:  $Gait Training: 8-22 mins                    G CodesDuncan Dull April 23, 2015, 4:08 PM Alben Deeds, [place] DPT  [phone]

## 2015-03-28 NOTE — Care Management Important Message (Signed)
Important Message  Patient Details  Name: [name] [name] MRN: [id number] Date of Birth: 21-Sep-1930   Medicare Important Message Given:  Yes-second notification given    Delorse Lek 03/28/2015, 8:44 AM

## 2015-03-29 ENCOUNTER — Other Ambulatory Visit: Payer: Self-pay | Admitting: Neurology

## 2015-03-29 DIAGNOSIS — I611 Nontraumatic intracerebral hemorrhage in hemisphere, cortical: Secondary | ICD-10-CM

## 2015-03-29 LAB — CBC
HCT: 37.6 % (ref 36.0–46.0)
Hemoglobin: 12.3 g/dL (ref 12.0–15.0)
MCH: 31.5 pg (ref 26.0–34.0)
MCHC: 32.7 g/dL (ref 30.0–36.0)
MCV: 96.2 fL (ref 78.0–100.0)
PLATELETS: 168 10*3/uL (ref 150–400)
RBC: 3.91 MIL/uL (ref 3.87–5.11)
RDW: 14.9 % (ref 11.5–15.5)
WBC: 6.5 10*3/uL (ref 4.0–10.5)

## 2015-03-29 LAB — BASIC METABOLIC PANEL
ANION GAP: 6 (ref 5–15)
BUN: 12 mg/dL (ref 6–20)
CO2: 27 mmol/L (ref 22–32)
Calcium: 8.8 mg/dL — ABNORMAL LOW (ref 8.9–10.3)
Chloride: 106 mmol/L (ref 101–111)
Creatinine, Ser: 0.92 mg/dL (ref 0.44–1.00)
GFR, EST NON AFRICAN AMERICAN: 57 mL/min — AB (ref >60)
GLUCOSE: 94 mg/dL (ref 65–99)
POTASSIUM: 3.4 mmol/L — AB (ref 3.5–5.1)
SODIUM: 139 mmol/L (ref 135–145)

## 2015-03-29 MED ORDER — FUROSEMIDE 20 MG PO TABS: 20.0000 mg | ORAL_TABLET | Freq: Every day | ORAL | Status: DC

## 2015-03-29 NOTE — Discharge Summary (Signed)
Stroke Discharge Summary  Patient ID: [name] [name]   MRN: [id number]      DOB: 31-Mar-1931  Date of Admission: 03/25/2015 Date of Discharge: 03/29/2015  Attending Physician:  Rosalin Hawking, MD, Stroke MD  Consulting Physician(s):     rehabilitation medicine  Patient's PCP:  [name], MD  DISCHARGE DIAGNOSIS:  Active Problems:   ICH (intracerebral hemorrhage), left posterior parietal   Afib, proxysmal   HTN   HLD    BMI: Body mass index is 28.34 kg/(m^2).  Past Medical History  Diagnosis Date  . Persistent atrial fibrillation   . sinus node dysfunction//post termination pauses   . HTN (hypertension)   . CAD (coronary artery disease)     LHC 5/08:  pOM 20%, pRCA 20-30%, EF 60%  . NASH (nonalcoholic steatohepatitis)   . PMR (polymyalgia rheumatica)     no steriods for 5 years  . HLD (hyperlipidemia)   . Neuromuscular disorder   . Hepatitis     hx of medication induced hepatitis (Vytorin per pt)  . Ascending aorta enlargement     4.8 cm per echo 08/07/11; 5.0 by CT in Jan 2013  . Pacemaker-Medtronic 11/12/2011    Implanted 2013   . Hx of echocardiogram     echo 1/13: EF 55%, Gr 2 diast dysfn, Asc Ao aneurysm 4.8 cm, mild MR, mod LAE, mild RAE   Past Surgical History  Procedure Laterality Date  . Umbilical hernia repair  1950's  . Incision and drainage breast abscess  1973  . Cardiac catheterization    . Hernia repair    . Pacemaker insertion  Jan 2013  . Cardioversion  03/02/2012    Procedure: CARDIOVERSION;  Surgeon: Lelon Perla, MD;  Location: [facility];  Service: Cardiovascular;  Laterality: N/A;  . Cardioversion  03/13/2012    Procedure: CARDIOVERSION;  Surgeon: Josue Hector, MD;  Location: [facility] ENDOSCOPY;  Service: Cardiovascular;  Laterality: N/A;  . Cardioversion  04/15/2012    Procedure: CARDIOVERSION;  Surgeon: [name], MD;  Location: [facility] ENDOSCOPY;  Service: Cardiovascular;  Laterality: N/A;  . Cardioversion N/A 06/07/2014    Procedure:  CARDIOVERSION;  Surgeon: [name], MD;  Location: [address];  Service: Cardiovascular;  Laterality: N/A;  . Permanent pacemaker insertion N/A 07/29/2011    Procedure: PERMANENT PACEMAKER INSERTION;  Surgeon: [name], MD;  Location: [facility] CATH LAB;  Service: Cardiovascular;  Laterality: N/A;  . Cardioversion N/A 08/18/2014    Procedure: CARDIOVERSION;  Surgeon: [name], MD;  Location: [facility] ENDOSCOPY;  Service: Cardiovascular;  Laterality: N/A;      Medication List    STOP taking these medications        hydrALAZINE 25 MG tablet  Commonly known as:  APRESOLINE     warfarin 2.5 MG tablet  Commonly known as:  COUMADIN      TAKE these medications        amiodarone 200 MG tablet  Commonly known as:  PACERONE  Take 1 tablet (200 mg total) by mouth daily.     CALTRATE 600+D PO  Take 1 tablet by mouth 2 (two) times daily.     cetirizine 10 MG tablet  Commonly known as:  ZYRTEC  Take 10 mg by mouth daily.     cholecalciferol 1000 UNITS tablet  Commonly known as:  VITAMIN D  Take 1,000 Units by mouth at bedtime.     furosemide 20 MG tablet  Commonly known as:  LASIX  Take 1 tablet (20 mg total) by mouth daily.     multivitamin per tablet  Take 1 tablet by mouth at bedtime.     nadolol 40 MG tablet  Commonly known as:  CORGARD  TAKE THREE TABLETS BY MOUTH EVERY EVENING     potassium chloride SA 20 MEQ tablet  Commonly known as:  K-DUR,KLOR-CON  Take 2 tablets (40 mEq total) by mouth daily.     traMADol 50 MG tablet  Commonly known as:  ULTRAM  Take 50 mg by mouth 2 (two) times daily as needed for moderate pain.     valsartan 320 MG tablet  Commonly known as:  DIOVAN  Take 1 tablet (320 mg total) by mouth daily.     WELCHOL 625 MG tablet  Generic drug:  colesevelam  Take 625-1,250 mg by mouth 2 (two) times daily. Take 625 mg every morning and 1250 mg at bedtime.        LABORATORY STUDIES CBC    Component Value Date/Time   WBC 6.5 03/29/2015  0550   RBC 3.91 03/29/2015 0550   HGB 12.3 03/29/2015 0550   HCT 37.6 03/29/2015 0550   PLT 168 03/29/2015 0550   MCV 96.2 03/29/2015 0550   MCH 31.5 03/29/2015 0550   MCHC 32.7 03/29/2015 0550   RDW 14.9 03/29/2015 0550   LYMPHSABS 1.0 03/25/2015 1600   MONOABS 0.8 03/25/2015 1600   EOSABS 0.1 03/25/2015 1600   BASOSABS 0.0 03/25/2015 1600   CMP    Component Value Date/Time   NA 139 03/29/2015 0550   K 3.4* 03/29/2015 0550   CL 106 03/29/2015 0550   CO2 27 03/29/2015 0550   GLUCOSE 94 03/29/2015 0550   BUN 12 03/29/2015 0550   CREATININE 0.92 03/29/2015 0550   CREATININE 0.90 09/28/2014 1015   CALCIUM 8.8* 03/29/2015 0550   PROT 6.2* 03/25/2015 1600   ALBUMIN 3.2* 03/25/2015 1600   AST 24 03/25/2015 1600   ALT 24 03/25/2015 1600   ALKPHOS 60 03/25/2015 1600   BILITOT 1.1 03/25/2015 1600   GFRNONAA 57* 03/29/2015 0550   GFRAA >60 03/29/2015 0550   COAGS Lab Results  Component Value Date   INR 1.13 03/26/2015   INR 1.17 03/25/2015   INR 2.35* 03/25/2015   PROTIME 17.3 01/02/2009   Lipid Panel    Component Value Date/Time   CHOL 181 03/27/2015 1548   TRIG 96 03/27/2015 1548   HDL 64 03/27/2015 1548   CHOLHDL 2.8 03/27/2015 1548   VLDL 19 03/27/2015 1548   LDLCALC 98 03/27/2015 1548   HgbA1C  Lab Results  Component Value Date   HGBA1C 5.5 03/27/2015   Cardiac Panel (last 3 results) No results for input(s): CKTOTAL, CKMB, TROPONINI, RELINDX in the last 72 hours. Urinalysis No results found for: COLORURINE, APPEARANCEUR, LABSPEC, PHURINE, GLUCOSEU, HGBUR, BILIRUBINUR, KETONESUR, PROTEINUR, UROBILINOGEN, NITRITE, LEUKOCYTESUR Urine Drug Screen No results found for: LABOPIA, COCAINSCRNUR, LABBENZ, AMPHETMU, THCU, LABBARB  Alcohol Level No results found for: [facility]   SIGNIFICANT DIAGNOSTIC STUDIES Ct Head Wo Contrast  03/26/2015 IMPRESSION: Slight interval decrease in size of left occipital intraparenchymal hemorrhage. No new acute intracranial finding.    03/26/2015 IMPRESSION: Stable LEFT parietal parasagittal intraparenchymal hematoma as described. No significant increase in hematoma or edema compared with yesterday's CT.   03/25/2015 IMPRESSION: Acute left posterior parietal intraparenchymal hemorrhage is noted.   2D echo - - Compared to a prior echo in 2013, there are few changes. Pacemaker wires are now noted. There is no  longer a pericadrial effusion. The ascending aorta is dilated to 4.4 cm at the most distally visualized point. Consider dedicated imaging of the aorta wtih CT. There is moderate TR with RVSP of 42 mmHg, the RA pressure is estimated at 8 mmHg.     HISTORY OF PRESENT ILLNESS [name] [name] is an 81 y.o. female with a history of atrial fibrillation on Coumadin therapy, status post permanent pacemaker (Medtronic) Dr Caryl Comes, hypertension, coronary artery disease, NASH, polymyalgia rheumatica, hyperlipidemia, and an ascending aortic aneurysm followed by Dr. Servando Snare. Yesterday the patient had just gotten out of the shower when she noticed an odd sort of headache on the top of her head. She doesn't normally have headaches. She took tramadol with some relief. The headache continued for most of the day. She slept well that night but when she got up today she still had a headache. She took another tramadol and felt somewhat drowsy, tired, and mildly dizzy. She states her blood pressure has been running high lately. She checks it at home. She has been getting systolics in the 793-903 range.  The patient came to the emergency department at [facility] where a CT scan of the head revealed an acute intraparenchymal hemorrhage measuring 2.6 x 1.8 cm is noted in the left posterior parietal cortex. Mild amount of surrounding white matter edema is noted. No midline shift is noted. Ventricular size is within normal limits. The patient is somewhat anxious and upset but is otherwise in no acute distress. Her physical exam  is essentially normal. Her Coumadin is being reversed at this time. Her INR on admission was 2.35. The patient will be admitted to the neuro intensive care unit for close monitoring. Her NIH score at this time is 0.   HOSPITAL COURSE [name] [name] is a 81 y.o. female a-fib on coumadin s/p pacemaker, HTN, CAD, HLD with L parietal ICH. INR 2.35, reversed with Kcentra. INR down to 1.13. Symptoms resolved and repeat CT stable. Her BP was low while in hospital and her BP meds has to be hold initially and put back gradually as BP getting up. Still holding off hydralazine on discharge. She needs to check BP at home and monitoring and discuss with PCP. Will need repeat CT in one month and consider resume anticoagulation. Pt is discharged in good condition.   ICH: Left posterior parietal ICH, likely due to HTN in the setting of coumadin use.   Resultant resolution of deficit  Repeat CT stable hematoma  2D Echo EF 55-60%  LDL 98  HgbA1c 5.5  SCDs for VTE prophylaxis  Diet Heart Room service appropriate?: Yes; Fluid consistency:: Thin   warfarin prior to admission, now on no antithrombotic due to [place]  Ongoing aggressive stroke risk factor management  Therapy recommendations: HH  Disposition: Plan for dc tomorrow  pAfib  On coumadin at home  Hold off coumadin due to [place]  will repeat CT in one month and consider NOAC if ICH resolves  On nadolol 150m Qhs at home  Resume nadolol  Resume amiodarone  Hypertension  Home meds: Nadolol, lasix, diovan, hydralazine  BP goal < 160  BP stable  Resume lasix and diovan and nadolol  Patient counseled to be compliant with her blood pressure medications  Hyperlipidemia  Home meds: on welchol  Currently on none  LDL 98, goal < 70  Allergy to statin  Follow up with PCP  Other Stroke Risk Factors  Advanced age  Coronary artery disease  Other  Active Problems  PMR not on steroids  Other Pertinent  History  Agitation on seroquel    DISCHARGE EXAM Blood pressure 132/58, pulse 63, temperature 97.8 F (36.6 C), temperature source Oral, resp. rate 16, height 5' 6"  (1.676 m), weight 175 lb 7.8 oz (79.6 kg), SpO2 97 %.    general - pleasant but occasionally tearful 81 year old female in no acute distress. Heart - Regular rate and rhythm, no afib rhythm heard Lungs - Clear to auscultation Abdomen - Soft - non tender Extremities - Distal pulses intact - no edema Skin - Warm and dry  Mental Status: Alert, oriented, thought content appropriate. Speech fluent without evidence of aphasia. Able to follow 3 step commands without difficulty. Cranial Nerves: II: Discs not visualized; Visual fields grossly normal, pupils equal, round, reactive to light and accommodation III,IV, VI: ptosis not present, extra-ocular motions intact bilaterally V,VII: smile symmetric, facial light touch sensation normal bilaterally VIII: hearing normal bilaterally IX,X: gag reflex present XI: bilateral shoulder shrug XII: midline tongue extension Motor: Right :Upper extremity 5/5Left: Upper extremity 5/5 Lower extremity 5/5Lower extremity 5/5 Tone and bulk:normal tone throughout; no atrophy noted Sensory: Light touch intact throughout, bilaterally except the patient seemed uncertain about her left upper extremity. Possibly mildly decreased sensation to light touch. Deep Tendon Reflexes: 2+ and symmetric throughout Plantars: Right: downgoingLeft: downgoing Cerebellar: normal finger-to-nose, normal rapid alternating movements and normal heel-to-shin test Gait: Deferred  Discharge Diet   Diet Heart Room service appropriate?: Yes; Fluid consistency:: Thin Diet - low sodium heart healthy liquids  DISCHARGE PLAN  Disposition:  Home with HH    no antithrombotic  for secondary stroke prevention.  Follow-up [name], MD in 2 weeks.  Follow-up with Dr. Rosalin Hawking [facility] in 1 month.  35 minutes were spent preparing discharge.  Rosalin Hawking, MD PhD Stroke Neurology 03/29/2015 1:04 PM

## 2015-03-29 NOTE — Progress Notes (Signed)
Occupational Therapy Treatment Patient Details Name: [name] [name] MRN: [id number] DOB: [birth year] Today's Date: 03/29/2015    History of present illness pt is an 81 y/o female who came to the emergency department at [facility] where a CT scan of the head revealed an acute intraparenchymal hemorrhage measuring 2.6 x 1.8 cm is noted in the left posterior parietal cortex. Mild amount of surrounding white matter edema is noted. No midline shift is noted. Ventricular size is within normal limits. The patient is somewhat anxious and upset but is otherwise in no acute distress. Her physical exam is essentially normal.  Transfered to ICU for close monitoring.   OT comments  Pt progressing towards acute OT goals. Focus of session was activity tolerance and balance during ADLs and household distance functional mobility. Session details below. D/c plan remains appropriate.   Follow Up Recommendations  Supervision/Assistance - 24 hour;Home health OT    Equipment Recommendations  Tub/shower seat    Recommendations for Other Services Other (comment) (f/u with opthamologist for visual field test)    Precautions / Restrictions Precautions Precautions: Fall Restrictions Weight Bearing Restrictions: No       Mobility Bed Mobility               General bed mobility comments: in recliner  Transfers Overall transfer level: Needs assistance Equipment used: Rolling walker (2 wheeled) Transfers: Sit to/from Stand Sit to Stand: Min guard;Supervision         General transfer comment: from recliner; min guard for safety with pt reporting "weakness in my head" and "wooziness"    Balance Overall balance assessment: Needs assistance         Standing balance support: Bilateral upper extremity supported;During functional activity Standing balance-Leahy Scale: Fair Standing balance comment: stood to complete grooming tasks with occasional external support                    ADL Overall ADL's : Needs assistance/impaired     Grooming: Min guard;Oral care;Wash/dry hands;Standing Grooming Details (indicate cue type and reason): stood to complete grooming tasks; min guard for safety with pt reporting feeling "weakness in my head" and "wooziness." Cued to keep rw in front of self while transisioning to standing in front of sink                             Functional mobility during ADLs: Min guard;Rolling walker General ADL Comments: Pt initially needing encouragement from family and therapist to mobilize. Pt reporting some "weakness in my head" and "wooziness" at start of session and hesitant to mobilize. After completing grooming standing at sink pt reported feeling better and was agreeable to complete household distance functional mobility at min guard level. Pt declined community distance functional mobility. Educated on safety with home setup and fall prevention. Spouse and daughter present for session. Discussed need for and options for shower seat.       Vision                     Perception     Praxis      Cognition   Behavior During Therapy: Anxious;WFL for tasks assessed/performed Overall Cognitive Status: Within Functional Limits for tasks assessed                       Extremity/Trunk Assessment  Exercises     Shoulder Instructions       General Comments      Pertinent Vitals/ Pain       Pain Assessment: No/denies pain  Home Living                                          Prior Functioning/Environment              Frequency Min 2X/week     Progress Toward Goals  OT Goals(current goals can now be found in the care plan section)  Progress towards OT goals: Progressing toward goals  Acute Rehab OT Goals Patient Stated Goal: back independent and doing for myself OT Goal Formulation: With patient/family Time For Goal Achievement: 04/11/15 Potential to  Achieve Goals: Good ADL Goals Pt Will Perform Grooming: with modified independence;standing Pt Will Transfer to Toilet: with modified independence;ambulating Pt Will Perform Tub/Shower Transfer: Shower transfer;with modified independence;ambulating;shower seat  Plan Discharge plan remains appropriate    Co-evaluation                 End of Session Equipment Utilized During Treatment: Gait belt;Rolling walker   Activity Tolerance Patient tolerated treatment well   Patient Left in chair;with call bell/phone within reach;with family/visitor present   Nurse Communication          Time: 8466-5993 OT Time Calculation (min): 22 min  Charges: OT General Charges $OT Visit: 1 Procedure OT Treatments $Self Care/Home Management : 8-22 mins  [name] [name] 03/29/2015, 1:28 PM

## 2015-03-29 NOTE — Progress Notes (Signed)
Discharge orders received, Pt for discharge home today. IV d/c'd. D/c instructions and RX given with verbalized understanding. Family at bedside to assist patient with discharge. Staff bought pt downstairs via wheelchair. 03/29/15 1417

## 2015-03-29 NOTE — Progress Notes (Signed)
OT recommending a shower/ tub bench. Patient's insurance will not cover this DME. CM spoke with patient and her family at the bedside and they would rather obtain a shower/ tub bench from an outside source.

## 2015-03-30 ENCOUNTER — Encounter (HOSPITAL_COMMUNITY): Payer: Medicare Other

## 2015-04-01 ENCOUNTER — Encounter (HOSPITAL_COMMUNITY): Payer: Self-pay | Admitting: Emergency Medicine

## 2015-04-01 ENCOUNTER — Emergency Department (HOSPITAL_COMMUNITY)
Admission: EM | Admit: 2015-04-01 | Discharge: 2015-04-01 | Disposition: A | Discharge status: Home / Self Care | Payer: Medicare Other | Attending: Emergency Medicine | Admitting: Emergency Medicine

## 2015-04-01 ENCOUNTER — Emergency Department (HOSPITAL_COMMUNITY): Payer: Medicare Other

## 2015-04-01 DIAGNOSIS — I159 Secondary hypertension, unspecified: Secondary | ICD-10-CM | POA: Diagnosis not present

## 2015-04-01 DIAGNOSIS — R079 Chest pain, unspecified: Secondary | ICD-10-CM | POA: Diagnosis not present

## 2015-04-01 DIAGNOSIS — I10 Essential (primary) hypertension: Secondary | ICD-10-CM | POA: Diagnosis not present

## 2015-04-01 DIAGNOSIS — Z9889 Other specified postprocedural states: Secondary | ICD-10-CM | POA: Insufficient documentation

## 2015-04-01 DIAGNOSIS — Z87891 Personal history of nicotine dependence: Secondary | ICD-10-CM | POA: Insufficient documentation

## 2015-04-01 DIAGNOSIS — I2510 Atherosclerotic heart disease of native coronary artery without angina pectoris: Secondary | ICD-10-CM | POA: Diagnosis not present

## 2015-04-01 DIAGNOSIS — Z880 Allergy status to penicillin: Secondary | ICD-10-CM | POA: Insufficient documentation

## 2015-04-01 DIAGNOSIS — I610 Nontraumatic intracerebral hemorrhage in hemisphere, subcortical: Secondary | ICD-10-CM

## 2015-04-01 DIAGNOSIS — I4891 Unspecified atrial fibrillation: Secondary | ICD-10-CM

## 2015-04-01 DIAGNOSIS — Z8669 Personal history of other diseases of the nervous system and sense organs: Secondary | ICD-10-CM | POA: Diagnosis not present

## 2015-04-01 DIAGNOSIS — Z8719 Personal history of other diseases of the digestive system: Secondary | ICD-10-CM | POA: Insufficient documentation

## 2015-04-01 DIAGNOSIS — Z950 Presence of cardiac pacemaker: Secondary | ICD-10-CM | POA: Diagnosis not present

## 2015-04-01 DIAGNOSIS — Z79899 Other long term (current) drug therapy: Secondary | ICD-10-CM | POA: Diagnosis not present

## 2015-04-01 DIAGNOSIS — Z8639 Personal history of other endocrine, nutritional and metabolic disease: Secondary | ICD-10-CM | POA: Diagnosis not present

## 2015-04-01 DIAGNOSIS — R51 Headache: Secondary | ICD-10-CM | POA: Diagnosis not present

## 2015-04-01 HISTORY — DX: Nontraumatic intracerebral hemorrhage, unspecified: I61.9

## 2015-04-01 LAB — CBC WITH DIFFERENTIAL/PLATELET
Basophils Absolute: 0 10*3/uL (ref 0.0–0.1)
Basophils Relative: 0 %
EOS ABS: 0.1 10*3/uL (ref 0.0–0.7)
Eosinophils Relative: 2 %
HCT: 41.6 % (ref 36.0–46.0)
HEMOGLOBIN: 13.7 g/dL (ref 12.0–15.0)
LYMPHS ABS: 1 10*3/uL (ref 0.7–4.0)
LYMPHS PCT: 16 %
MCH: 31.7 pg (ref 26.0–34.0)
MCHC: 32.9 g/dL (ref 30.0–36.0)
MCV: 96.3 fL (ref 78.0–100.0)
Monocytes Absolute: 0.7 10*3/uL (ref 0.1–1.0)
Monocytes Relative: 11 %
NEUTROS ABS: 4.4 10*3/uL (ref 1.7–7.7)
NEUTROS PCT: 71 %
Platelets: 167 10*3/uL (ref 150–400)
RBC: 4.32 MIL/uL (ref 3.87–5.11)
RDW: 14.8 % (ref 11.5–15.5)
WBC: 6.2 10*3/uL (ref 4.0–10.5)

## 2015-04-01 LAB — TROPONIN I

## 2015-04-01 LAB — BASIC METABOLIC PANEL
ANION GAP: 9 (ref 5–15)
BUN: 15 mg/dL (ref 6–20)
CHLORIDE: 107 mmol/L (ref 101–111)
CO2: 24 mmol/L (ref 22–32)
Calcium: 9.2 mg/dL (ref 8.9–10.3)
Creatinine, Ser: 0.88 mg/dL (ref 0.44–1.00)
GFR calc non Af Amer: 60 mL/min — ABNORMAL LOW (ref >60)
Glucose, Bld: 100 mg/dL — ABNORMAL HIGH (ref 65–99)
POTASSIUM: 3.9 mmol/L (ref 3.5–5.1)
SODIUM: 140 mmol/L (ref 135–145)

## 2015-04-01 NOTE — ED Notes (Signed)
Dr. Dayna Barker informed of patient's problem and history

## 2015-04-01 NOTE — ED Notes (Signed)
Patient transported to CT 

## 2015-04-01 NOTE — Consult Note (Signed)
Admission H&P    Chief Complaint: Elevated blood pressure well his recent intraparenchymal hemorrhage.  HPI: [name] [name] is an 81 y.o. female with a history of atrial fibrillation, pacemaker placement, hypertension and hyperlipidemia, who was admitted 1 week ago with acute onset of left parietal intracerebral hemorrhage. Patient was on Coumadin at the time, which was discontinued. She had no focal deficits. She was discharged on 03/29/2015. She had only mild headache at the time. She is not experiencing headache today. She sought medical attention because of healing of discomfort in her chest as well as elevated blood pressure with systolic readings above 161. Repeat CT scan of her head showed a slight increase in the size of her left parietal hematoma, as well as a new small satellite area of hemorrhage involving region of surrounding edema. Patient's blood pressure returned to normal in the emergency room without intervention. She had no complaints at the time of this evaluation and a NIH stroke score is 0.   Past Medical History  Diagnosis Date  . Persistent atrial fibrillation   . sinus node dysfunction//post termination pauses   . HTN (hypertension)   . CAD (coronary artery disease)     LHC 5/08:  pOM 20%, pRCA 20-30%, EF 60%  . NASH (nonalcoholic steatohepatitis)   . PMR (polymyalgia rheumatica)     no steriods for 5 years  . HLD (hyperlipidemia)   . Neuromuscular disorder   . Hepatitis     hx of medication induced hepatitis (Vytorin per pt)  . Ascending aorta enlargement     4.8 cm per echo 08/07/11; 5.0 by CT in Jan 2013  . Pacemaker-Medtronic 11/12/2011    Implanted 2013   . Hx of echocardiogram     echo 1/13: EF 55%, Gr 2 diast dysfn, Asc Ao aneurysm 4.8 cm, mild MR, mod LAE, mild RAE  . ICH (intracerebral hemorrhage) 03/24/15    Past Surgical History  Procedure Laterality Date  . Umbilical hernia repair  1950's  . Incision and drainage breast abscess  1973  . Cardiac  catheterization    . Hernia repair    . Pacemaker insertion  Jan 2013  . Cardioversion  03/02/2012    Procedure: CARDIOVERSION;  Surgeon: Lelon Perla, MD;  Location: [place];  Service: Cardiovascular;  Laterality: N/A;  . Cardioversion  03/13/2012    Procedure: CARDIOVERSION;  Surgeon: Josue Hector, MD;  Location: [facility] ENDOSCOPY;  Service: Cardiovascular;  Laterality: N/A;  . Cardioversion  04/15/2012    Procedure: CARDIOVERSION;  Surgeon: [name], MD;  Location: [facility] ENDOSCOPY;  Service: Cardiovascular;  Laterality: N/A;  . Cardioversion N/A 06/07/2014    Procedure: CARDIOVERSION;  Surgeon: [name], MD;  Location: [place];  Service: Cardiovascular;  Laterality: N/A;  . Permanent pacemaker insertion N/A 07/29/2011    Procedure: PERMANENT PACEMAKER INSERTION;  Surgeon: [name], MD;  Location: [facility] CATH LAB;  Service: Cardiovascular;  Laterality: N/A;  . Cardioversion N/A 08/18/2014    Procedure: CARDIOVERSION;  Surgeon: [name], MD;  Location: [facility] ENDOSCOPY;  Service: Cardiovascular;  Laterality: N/A;    Family History  Problem Relation Age of Onset  . Coronary artery disease    . Alzheimer's disease     Social History:  reports that she quit smoking about 24 years ago. Her smoking use included Cigarettes. She has never used smokeless tobacco. She reports that she does not drink alcohol or use illicit drugs.  Allergies:  Allergies  Allergen Reactions  .  Penicillins Anaphylaxis  . Statins Other (See Comments)    REACTION: elevated LFT's  . Tylenol [Acetaminophen] Other (See Comments)    If taken with Vytorin at risk for liver damage  . Tape Other (See Comments)    Redness, Please use "paper" tape only.  . Codeine Other (See Comments)    REACTION: feel funny    Medications: Patient's preadmission medications were reviewed by me.  ROS: History obtained from patient and patient's spouse, as well as patient's daughter.  General ROS:  negative for - chills, fatigue, fever, night sweats, weight gain or weight loss Psychological ROS: negative for - behavioral disorder, hallucinations, memory difficulties, mood swings or suicidal ideation Ophthalmic ROS: negative for - blurry vision, double vision, eye pain or loss of vision ENT ROS: negative for - epistaxis, nasal discharge, oral lesions, sore throat, tinnitus or vertigo Allergy and Immunology ROS: negative for - hives or itchy/watery eyes Hematological and Lymphatic ROS: negative for - bleeding problems, bruising or swollen lymph nodes Endocrine ROS: negative for - galactorrhea, hair pattern changes, polydipsia/polyuria or temperature intolerance Respiratory ROS: negative for - cough, hemoptysis, shortness of breath or wheezing Cardiovascular ROS: negative for - chest pain, dyspnea on exertion, edema or irregular heartbeat Gastrointestinal ROS: negative for - abdominal pain, diarrhea, hematemesis, nausea/vomiting or stool incontinence Genito-Urinary ROS: negative for - dysuria, hematuria, incontinence or urinary frequency/urgency Musculoskeletal ROS: negative for - joint swelling or muscular weakness Neurological ROS: as noted in HPI Dermatological ROS: negative for rash and skin lesion changes  Physical Examination: Blood pressure 142/62, pulse 66, temperature 97.7 F (36.5 C), temperature source Oral, resp. rate 14, weight 79.379 kg (175 lb), SpO2 100 %.  HEENT-  Normocephalic, no lesions, without obvious abnormality.  Normal external eye and conjunctiva.  Normal TM's bilaterally.  Normal auditory canals and external ears. Normal external nose, mucus membranes and septum.  Normal pharynx. Neck supple with no masses, nodes, nodules or enlargement. Cardiovascular - regular rate and rhythm, S1, S2 normal, no murmur, click, rub or gallop Lungs - chest clear, no wheezing, rales, normal symmetric air entry Abdomen - soft, non-tender; bowel sounds normal; no masses,  no  organomegaly Extremities - no joint deformities, effusion, or inflammation and no edema  Neurologic Examination: Mental Status: Alert, oriented, thought content appropriate.  Speech fluent without evidence of aphasia. Able to follow commands without difficulty. Cranial Nerves: II-Visual fields were normal. III/IV/VI-Pupils were equal and reacted normally to light. Extraocular movements were full and conjugate.    V/VII-no facial numbness and no facial weakness. VIII-normal. X-normal speech and symmetrical palatal movement. XI: trapezius strength/neck flexion strength normal bilaterally XII-midline tongue extension with normal strength. Motor: 5/5 bilaterally with normal tone and bulk Sensory: Normal throughout. Deep Tendon Reflexes: 1+ and symmetric. Plantars: Flexor bilaterally Cerebellar: Normal finger-to-nose testing. Carotid auscultation: Normal  Results for orders placed or performed during the hospital encounter of 04/01/15 (from the past 48 hour(s))  Basic metabolic panel     Status: Abnormal   Collection Time: 04/01/15 11:35 AM  Result Value Ref Range   Sodium 140 135 - 145 mmol/L   Potassium 3.9 3.5 - 5.1 mmol/L   Chloride 107 101 - 111 mmol/L   CO2 24 22 - 32 mmol/L   Glucose, Bld 100 (H) 65 - 99 mg/dL   BUN 15 6 - 20 mg/dL   Creatinine, Ser 0.88 0.44 - 1.00 mg/dL   Calcium 9.2 8.9 - 10.3 mg/dL   GFR calc non Af Amer 60 (L) >60 mL/min  GFR calc Af Amer >60 >60 mL/min    Comment: (NOTE) The eGFR has been calculated using the CKD EPI equation. This calculation has not been validated in all clinical situations. eGFR's persistently <60 mL/min signify possible Chronic Kidney Disease.    Anion gap 9 5 - 15  CBC with Differential     Status: None   Collection Time: 04/01/15 11:35 AM  Result Value Ref Range   WBC 6.2 4.0 - 10.5 K/uL   RBC 4.32 3.87 - 5.11 MIL/uL   Hemoglobin 13.7 12.0 - 15.0 g/dL   HCT 41.6 36.0 - 46.0 %   MCV 96.3 78.0 - 100.0 fL   MCH 31.7 26.0  - 34.0 pg   MCHC 32.9 30.0 - 36.0 g/dL   RDW 14.8 11.5 - 15.5 %   Platelets 167 150 - 400 K/uL   Neutrophils Relative % 71 %   Neutro Abs 4.4 1.7 - 7.7 K/uL   Lymphocytes Relative 16 %   Lymphs Abs 1.0 0.7 - 4.0 K/uL   Monocytes Relative 11 %   Monocytes Absolute 0.7 0.1 - 1.0 K/uL   Eosinophils Relative 2 %   Eosinophils Absolute 0.1 0.0 - 0.7 K/uL   Basophils Relative 0 %   Basophils Absolute 0.0 0.0 - 0.1 K/uL  Troponin I     Status: None   Collection Time: 04/01/15 11:35 AM  Result Value Ref Range   Troponin I <0.03 <0.031 ng/mL    Comment:        NO INDICATION OF MYOCARDIAL INJURY.    Dg Chest 2 View  04/01/2015   CLINICAL DATA:  Hypertension, dizziness, acute left intracranial hemorrhage.  EXAM: CHEST  2 VIEW  COMPARISON:  09/12/2014.  FINDINGS: Left subclavian 2 lead pacer noted. Normal heart size and vascularity. Lungs remain clear. No focal pneumonia or edema. Negative for effusion or pneumothorax. Trachea midline. Atherosclerosis noted of the aorta. Bones are osteopenic. Degenerative changes noted of the spine. Increased thoracic kyphosis  IMPRESSION: Stable chest exam.  No superimposed acute process.   Electronically Signed   By: Jerilynn Mages.  Shick M.D.   On: 04/01/2015 11:52   Ct Head Wo Contrast  04/01/2015   CLINICAL DATA:  Worsening headache, known acute intracranial hemorrhage  EXAM: CT HEAD WITHOUT CONTRAST  TECHNIQUE: Contiguous axial images were obtained from the base of the skull through the vertex without intravenous contrast.  COMPARISON:  03/26/2015  FINDINGS: Slight increase in the size of posterior parietal lobe hematoma now measuring 27 x 18 mm as compared to 26 x 14 mm previously. Surrounding vasogenic edema similar to prior study. Small anterior superior satellite hemorrhage measuring 6 mm, new from prior study. No other areas of hemorrhage or abnormality. No intraventricular hemorrhage or hydrocephalus.  IMPRESSION: Slight increase in the size of left posterior parietal  hematoma with development of 6 mm satellite hemorrhage as well, new from most recent prior study.   Electronically Signed   By: Skipper Cliche M.D.   On: 04/01/2015 11:34    Assessment: 81 y.o. female with multiple risk factors for stroke as well as left parietal intraparenchymal hemorrhage one week ago presenting with hypertension, but no signs of recurrent acute intracranial hemorrhage. Findings on CT scan are likely evolutionary changes associated with her previous hemorrhage, including a small satellite area of hemorrhage which is likely of capillary origin. There is no indication of recurrent active intracranial hemorrhage likely, normal CT study.  Plan: No further neurological intervention is indicated acutely. Recommend no changes  in current management, including no changes in blood pressure medicine since her blood pressure returned to normal without treatment intervention. She is to keep her follow-up appointments with stroke neurology outpatient clinic, as well as with her primary physician.  C.R. Nicole Kindred, MD [facility] [phone]  04/01/2015, 1:37 PM

## 2015-04-01 NOTE — ED Provider Notes (Signed)
CSN: [id number]     Arrival date & time 04/01/15  1017 History   First MD Initiated Contact with Patient 04/01/15 1018     No chief complaint on file.    (Consider location/radiation/quality/duration/timing/severity/associated sxs/prior Treatment) Patient is a 81 y.o. female presenting with hypertension.  Hypertension This is a chronic problem. The current episode started yesterday. The problem occurs constantly. Associated symptoms include chest pain. Pertinent negatives include no shortness of breath. Nothing aggravates the symptoms. Nothing relieves the symptoms. She has tried nothing for the symptoms. The treatment provided no relief.    Past Medical History  Diagnosis Date  . Persistent atrial fibrillation   . sinus node dysfunction//post termination pauses   . HTN (hypertension)   . CAD (coronary artery disease)     LHC 5/08:  pOM 20%, pRCA 20-30%, EF 60%  . NASH (nonalcoholic steatohepatitis)   . PMR (polymyalgia rheumatica)     no steriods for 5 years  . HLD (hyperlipidemia)   . Neuromuscular disorder   . Hepatitis     hx of medication induced hepatitis (Vytorin per pt)  . Ascending aorta enlargement     4.8 cm per echo 08/07/11; 5.0 by CT in Jan 2013  . Pacemaker-Medtronic 11/12/2011    Implanted 2013   . Hx of echocardiogram     echo 1/13: EF 55%, Gr 2 diast dysfn, Asc Ao aneurysm 4.8 cm, mild MR, mod LAE, mild RAE   Past Surgical History  Procedure Laterality Date  . Umbilical hernia repair  1950's  . Incision and drainage breast abscess  1973  . Cardiac catheterization    . Hernia repair    . Pacemaker insertion  Jan 2013  . Cardioversion  03/02/2012    Procedure: CARDIOVERSION;  Surgeon: Lelon Perla, MD;  Location: [place];  Service: Cardiovascular;  Laterality: N/A;  . Cardioversion  03/13/2012    Procedure: CARDIOVERSION;  Surgeon: Josue Hector, MD;  Location: [facility] ENDOSCOPY;  Service: Cardiovascular;  Laterality: N/A;  . Cardioversion  04/15/2012     Procedure: CARDIOVERSION;  Surgeon: [name], MD;  Location: [facility] ENDOSCOPY;  Service: Cardiovascular;  Laterality: N/A;  . Cardioversion N/A 06/07/2014    Procedure: CARDIOVERSION;  Surgeon: [name], MD;  Location: [place];  Service: Cardiovascular;  Laterality: N/A;  . Permanent pacemaker insertion N/A 07/29/2011    Procedure: PERMANENT PACEMAKER INSERTION;  Surgeon: [name], MD;  Location: [facility] CATH LAB;  Service: Cardiovascular;  Laterality: N/A;  . Cardioversion N/A 08/18/2014    Procedure: CARDIOVERSION;  Surgeon: [name], MD;  Location: [facility] ENDOSCOPY;  Service: Cardiovascular;  Laterality: N/A;   Family History  Problem Relation Age of Onset  . Coronary artery disease    . Alzheimer's disease     Social History  Substance Use Topics  . Smoking status: Former Smoker    Types: Cigarettes    Quit date: 07/16/1990  . Smokeless tobacco: Never Used  . Alcohol Use: No   OB History    No data available     Review of Systems  Constitutional: Negative for fever and chills.  HENT: Negative for congestion and drooling.   Eyes: Negative for pain.  Respiratory: Negative for shortness of breath.   Cardiovascular: Positive for chest pain.  Gastrointestinal: Negative for nausea, vomiting and diarrhea.  Endocrine: Negative for polydipsia and polyuria.  Genitourinary: Negative for flank pain, vaginal bleeding and vaginal discharge.  Musculoskeletal: Negative for back pain.  Skin: Negative for rash.  Neurological: Negative for numbness.      Allergies  Penicillins; Statins; Tylenol; Tape; and Codeine  Home Medications   Prior to Admission medications   Medication Sig Start Date End Date Taking? Authorizing Provider  amiodarone (PACERONE) 200 MG tablet Take 1 tablet (200 mg total) by mouth daily. 08/23/14   Burtis Junes, NP  Calcium Carbonate-Vitamin D (CALTRATE 600+D PO) Take 1 tablet by mouth 2 (two) times daily.    Historical Provider, MD   cetirizine (ZYRTEC) 10 MG tablet Take 10 mg by mouth daily.     Historical Provider, MD  Cholecalciferol (VITAMIN D3) 1000 UNITS tablet Take 1,000 Units by mouth at bedtime.     Historical Provider, MD  furosemide (LASIX) 20 MG tablet Take 1 tablet (20 mg total) by mouth daily. 03/29/15   Rosalin Hawking, MD  multivitamin [facility]) per tablet Take 1 tablet by mouth at bedtime.     Historical Provider, MD  nadolol (CORGARD) 40 MG tablet TAKE THREE TABLETS BY MOUTH EVERY EVENING 03/27/15   Lendon Colonel, NP  potassium chloride SA (K-DUR,KLOR-CON) 20 MEQ tablet Take 2 tablets (40 mEq total) by mouth daily. Patient taking differently: Take 20 mEq by mouth 2 (two) times daily.  11/29/14   [name], MD  traMADol (ULTRAM) 50 MG tablet Take 50 mg by mouth 2 (two) times daily as needed for moderate pain.    Historical Provider, MD  valsartan (DIOVAN) 320 MG tablet Take 1 tablet (320 mg total) by mouth daily. 02/21/15   [name], MD  [facility] 625 MG tablet Take 625-1,250 mg by mouth 2 (two) times daily. Take 625 mg every morning and 1250 mg at bedtime. 05/08/12   Historical Provider, MD   There were no vitals taken for this visit. Physical Exam  Constitutional: She is oriented to person, place, and time. She appears well-developed and well-nourished.  HENT:  Head: Normocephalic and atraumatic.  Eyes: Conjunctivae and EOM are normal. Right eye exhibits no discharge. Left eye exhibits no discharge.  Cardiovascular: Normal rate and regular rhythm.   Pulmonary/Chest: Effort normal and breath sounds normal. No respiratory distress.  Abdominal: Soft. She exhibits no distension. There is no tenderness. There is no rebound.  Musculoskeletal: Normal range of motion. She exhibits no edema or tenderness.  Neurological: She is alert and oriented to person, place, and time.  No altered mental status, able to give full seemingly accurate history.  Face is symmetric, EOM's intact, L pupil slightly larger  than right, tongue and uvula midline without deviation Upper and Lower extremity motor 5/5, intact pain perception in distal extremities, 2+ reflexes in biceps, patella and achilles tendons. Finger to nose normal, heel to shin normal.   Skin: Skin is warm and dry.  Nursing note and vitals reviewed.   ED Course  Procedures (including critical care time) Labs Review Labs Reviewed  BASIC METABOLIC PANEL - Abnormal; Notable for the following:    Glucose, Bld 100 (*)    GFR calc non Af Amer 60 (*)    All other components within normal limits  CBC WITH DIFFERENTIAL/PLATELET  TROPONIN I    Imaging Review No results found. I have personally reviewed and evaluated these images and lab results as part of my medical decision-making.   EKG Interpretation   Date/Time:  Saturday April 01 2015 10:26:26 EDT Ventricular Rate:  78 PR Interval:  68 QRS Duration: 122 QT Interval:  423 QTC Calculation: 482 R Axis:   -34 Text Interpretation:  Atrial-paced complexes LVH with secondary  repolarization abnormality Anterior Q waves, possibly due to LVH No  significant change since last tracing Confirmed by [facility] MD, Corene Cornea  [phone]) on 04/01/2015 10:29:58 AM      MDM   Final diagnoses:  Secondary hypertension, unspecified   Elevated BP with associated chest discomfort at home. No neuro complaints. Exam with R>L pupil diameter. BP not super high. States it is supposed to be <140, neuro note says <160. Will need to verify. Will r/o ACS with delta troponin. Will eval for worsening bleed with head CT. Will monitor BP for now.   Head CT with question of worsening bleed and possible new bleed. BP improved on its own. Called neurology for admission. They felt that none of this was new. Without any other new symptoms aside from minimal pupillary difference and no headache now that the patient was stable for discharge. Discussed this with patient and family and they are ok with this. Neuro exam  unchanged. Will return for new/worsening symptoms.   I have personally and contemperaneously reviewed labs and imaging and used in my decision making as above.   A medical screening exam was performed and I feel the patient has had an appropriate workup for their chief complaint at this time and likelihood of emergent condition existing is low. They have been counseled on decision, discharge, follow up and which symptoms necessitate immediate return to the emergency department. They or their family verbally stated understanding and agreement with plan and discharged in stable condition.      Merrily Pew, MD 04/04/15 [id number]

## 2015-04-01 NOTE — ED Notes (Signed)
Per EMS: hemorrhagic stroke last Friday. Was here for 4 days, went home, was told if bp was higher than 140 for 2 days then call her doctor.  States it was a little high yesterday, today it was in the 170s.  She took her BP meds about 0700 this am.  BP was 196/96 with EMS.  Decreased en route at times 130s and 150s, last BP obtained by EMS was 174/94.  Hx of afib, was taken off coumadin in hospital.  HR was fairly regular 90s with EMS.  Negative stroke exam, no outward neurological signs with EMS.  Since last Friday she has had a h/a progressively improving, today is first day of being pain free.  Left pupil larger than right, wasn't sure if this is new or normal.  Whenever she did have the stroke, her only symptom was h/a, no weakness or other sensory deficits.  20 gauge in right wrist.  Denied dizziness, nausea, sob, no other complaints.  Nervous about having another stroke.  En route she got a little whoozy, does do well riding in back of ambulance.

## 2015-04-01 NOTE — ED Notes (Signed)
MD at bedside. 

## 2015-04-01 NOTE — ED Notes (Signed)
11:24 C/o facial tingling. No headache.   11:30 Tingling less severe, "maybe it just my anxiety."

## 2015-04-01 NOTE — ED Notes (Signed)
Patient transported to CT and XR with RN

## 2015-04-06 ENCOUNTER — Encounter (HOSPITAL_COMMUNITY): Payer: Self-pay

## 2015-04-06 ENCOUNTER — Emergency Department (HOSPITAL_COMMUNITY): Payer: Medicare Other

## 2015-04-06 ENCOUNTER — Emergency Department (HOSPITAL_COMMUNITY)
Admission: EM | Admit: 2015-04-06 | Discharge: 2015-04-06 | Disposition: A | Discharge status: Home / Self Care | Payer: Medicare Other | Attending: Emergency Medicine | Admitting: Emergency Medicine

## 2015-04-06 DIAGNOSIS — Z8669 Personal history of other diseases of the nervous system and sense organs: Secondary | ICD-10-CM | POA: Diagnosis not present

## 2015-04-06 DIAGNOSIS — Z880 Allergy status to penicillin: Secondary | ICD-10-CM | POA: Insufficient documentation

## 2015-04-06 DIAGNOSIS — I2510 Atherosclerotic heart disease of native coronary artery without angina pectoris: Secondary | ICD-10-CM | POA: Insufficient documentation

## 2015-04-06 DIAGNOSIS — Z87891 Personal history of nicotine dependence: Secondary | ICD-10-CM | POA: Insufficient documentation

## 2015-04-06 DIAGNOSIS — I10 Essential (primary) hypertension: Secondary | ICD-10-CM | POA: Diagnosis not present

## 2015-04-06 DIAGNOSIS — I629 Nontraumatic intracranial hemorrhage, unspecified: Secondary | ICD-10-CM | POA: Diagnosis not present

## 2015-04-06 DIAGNOSIS — R51 Headache: Secondary | ICD-10-CM | POA: Insufficient documentation

## 2015-04-06 DIAGNOSIS — G441 Vascular headache, not elsewhere classified: Secondary | ICD-10-CM | POA: Diagnosis not present

## 2015-04-06 DIAGNOSIS — Z8719 Personal history of other diseases of the digestive system: Secondary | ICD-10-CM | POA: Diagnosis not present

## 2015-04-06 DIAGNOSIS — Z79899 Other long term (current) drug therapy: Secondary | ICD-10-CM | POA: Diagnosis not present

## 2015-04-06 DIAGNOSIS — Z950 Presence of cardiac pacemaker: Secondary | ICD-10-CM | POA: Insufficient documentation

## 2015-04-06 DIAGNOSIS — Z8639 Personal history of other endocrine, nutritional and metabolic disease: Secondary | ICD-10-CM | POA: Insufficient documentation

## 2015-04-06 DIAGNOSIS — Z9889 Other specified postprocedural states: Secondary | ICD-10-CM | POA: Insufficient documentation

## 2015-04-06 DIAGNOSIS — R519 Headache, unspecified: Secondary | ICD-10-CM

## 2015-04-06 LAB — CBC WITH DIFFERENTIAL/PLATELET
BASOS ABS: 0 10*3/uL (ref 0.0–0.1)
BASOS PCT: 0 %
EOS PCT: 4 %
Eosinophils Absolute: 0.2 10*3/uL (ref 0.0–0.7)
HCT: 43.6 % (ref 36.0–46.0)
Hemoglobin: 14.3 g/dL (ref 12.0–15.0)
LYMPHS PCT: 20 %
Lymphs Abs: 1.1 10*3/uL (ref 0.7–4.0)
MCH: 31.5 pg (ref 26.0–34.0)
MCHC: 32.8 g/dL (ref 30.0–36.0)
MCV: 96 fL (ref 78.0–100.0)
MONO ABS: 0.5 10*3/uL (ref 0.1–1.0)
Monocytes Relative: 9 %
NEUTROS ABS: 3.8 10*3/uL (ref 1.7–7.7)
Neutrophils Relative %: 67 %
PLATELETS: 168 10*3/uL (ref 150–400)
RBC: 4.54 MIL/uL (ref 3.87–5.11)
RDW: 14.9 % (ref 11.5–15.5)
WBC: 5.7 10*3/uL (ref 4.0–10.5)

## 2015-04-06 LAB — CBG MONITORING, ED: GLUCOSE-CAPILLARY: 89 mg/dL (ref 65–99)

## 2015-04-06 LAB — COMPREHENSIVE METABOLIC PANEL
ALBUMIN: 3.3 g/dL — AB (ref 3.5–5.0)
ALT: 19 U/L (ref 14–54)
AST: 22 U/L (ref 15–41)
Alkaline Phosphatase: 67 U/L (ref 38–126)
Anion gap: 9 (ref 5–15)
BUN: 15 mg/dL (ref 6–20)
CHLORIDE: 109 mmol/L (ref 101–111)
CO2: 24 mmol/L (ref 22–32)
CREATININE: 0.92 mg/dL (ref 0.44–1.00)
Calcium: 9.7 mg/dL (ref 8.9–10.3)
GFR calc Af Amer: >60 mL/min (ref >60)
GFR calc non Af Amer: 57 mL/min — ABNORMAL LOW (ref >60)
GLUCOSE: 98 mg/dL (ref 65–99)
POTASSIUM: 3.9 mmol/L (ref 3.5–5.1)
SODIUM: 142 mmol/L (ref 135–145)
Total Bilirubin: 0.8 mg/dL (ref 0.3–1.2)
Total Protein: 6.7 g/dL (ref 6.5–8.1)

## 2015-04-06 LAB — APTT: APTT: 27 s (ref 24–37)

## 2015-04-06 LAB — TYPE AND SCREEN
ABO/RH(D): A POS
ANTIBODY SCREEN: NEGATIVE

## 2015-04-06 LAB — PROTIME-INR
INR: 1.06 (ref 0.00–1.49)
Prothrombin Time: 14 seconds (ref 11.6–15.2)

## 2015-04-06 LAB — I-STAT TROPONIN, ED: TROPONIN I, POC: 0 ng/mL (ref 0.00–0.08)

## 2015-04-06 LAB — SEDIMENTATION RATE: SED RATE: 30 mm/h — AB (ref 0–22)

## 2015-04-06 LAB — ABO/RH: ABO/RH(D): A POS

## 2015-04-06 NOTE — ED Notes (Signed)
EKG completed given to EDP.  

## 2015-04-06 NOTE — ED Notes (Signed)
Patient transported to CT 

## 2015-04-06 NOTE — Discharge Instructions (Signed)
You are more prone to develop headaches in the setting of having blood within your head. On your CT scan, your head bleed do not appear worse. We recommend that you see your primary care doctor in 2-3 days to discuss treatment for headaches. Return without fail for worsening symptoms, including confusion, vomiting and unable to keep down food or fluids, numbness or weakness, speech changes or vision changes,  worsening pain, or any other symptoms concerning to you.  Hemorrhagic Stroke A hemorrhagic stroke occurs when a blood vessel in the brain leaks or bursts. Areas of the brain that should receive blood, oxygen, and nutrients from the damaged blood vessel are deprived of blood flow. This causes areas of the brain to become damaged. Damage also occurs to areas of the brain where the leaked blood accumulates and presses on normal tissue. This is a medical emergency. This can cause permanent damage and loss of brain function. CAUSES  A hemorrhagic stroke is caused by a decrease of oxygen supply to an area of your brain. It is the result of a blood vessel that leaks or ruptures. The leaking or rupturing blood vessel occurs due to one of the following conditions:  A ballooning of a weak section in a blood vessel (aneurysm).  Hardened, thin blood vessels. Blood vessel walls lined with plaque becoming thin and hardened. These hardened, thin artery walls can crack open and allow blood to flow out of the blood vessel.  An abnormal formation of a blood vessel (arteriovenous malformation). This condition results in an abnormal tangle of thin-walled blood vessels. Once the blood vessel ruptures, bleeding occurs. The blood from the ruptured blood vessel accumulates and compresses the surrounding brain tissue. Hemorrhagic strokes are classified as to the location of the bleed. If the bleeding occurs within the brain tissue, the condition is called an intracerebral hemorrhage. If the bleeding occurs in the area  between the brain and the thin tissues that cover the brain, the condition is called a subarachnoid hemorrhage.  RISK FACTORS  High blood pressure (hypertension).  Abnormal blood vessels present since birth.  Bleeding disorders, such as hemophilia, sickle cell disease, or liver disease.  The blood becoming too thin while taking blood thinners (anticoagulants).  Smoking.  Excessive alcohol use.  Use of illicit drugs (especially cocaine or methamphetamine). SYMPTOMS   Sudden, severe headache with no known cause. The headache is often described as the worst headache ever experienced.  Nausea or vomiting, especially when combined with other symptoms such as a headache.  Sudden weakness or numbness of the face, arm, or leg, especially on one side of the body.  Sudden trouble walking or difficulty moving the legs.  Sudden confusion.  Sudden personality changes.  Trouble speaking (aphasia) or understanding.  Difficulty swallowing.  Sudden trouble seeing in one or both eyes.  Double vision.  Dizziness.  Loss of balance or coordination.  Intolerance to light.  Stiff neck. DIAGNOSIS  Your health care provider will often suspect a hemorrhagic stroke based on your symptoms, history, and exam. A CT scan of the brain is usually performed. This is done to confirm the presence of bleeding in the brain, to look for causes, and to determine severity. Other tests may be done, including:  An MRI.  Angiography.  Blood tests. TREATMENT  The goals of treatment are to try to stop the bleeding, control pressure in the brain, and relieve symptoms.  Medicines may be given to:  Lower blood pressure (antihypertensives).  Relieve pain (analgesics).  Relieve nausea or vomiting.  Stop or prevent seizures.  Prevent the blood vessels in the brain from going into spasm in response to the presence of bleeding.  Other medicines, blood products, or vitamin K may also be given to control  the bleeding.  If there is a collection of blood putting pressure on your brain, or if the blood vessel continues to bleed, surgery may be required.  Surgery may also be needed if tests reveal that there are other problems within the blood vessels of the brain that put you at an elevated risk for another bleeding event in the future. Further treatment depends on the duration, severity, and cause of your symptoms. Physical, speech, and occupational therapists will assess you and work to improve any functions impaired by the stroke. Measures will be taken to prevent short-term and long-term complications, including infection from breathing foreign material into the lungs (aspiration pneumonia), blood clots in the legs, bedsores, and falls. HOME CARE INSTRUCTIONS   Take medicines only as instructed by your health care provider.  If swallow studies have determined that your swallowing reflex is present, you should eat healthy foods. Including 5 or more servings of fruits and vegetables a day may reduce the risk of stroke. Foods may need to be a certain consistency (soft or pureed), or small bites may need to be taken in order to avoid aspirating or choking. Certain dietary changes may be advised to address high blood pressure, high cholesterol, diabetes, or obesity.  Food choices that are low in sodium, saturated fat, trans fat, and cholesterol are recommended to manage high blood pressure.  Food choies that are high in fiber, and low in saturated fat, trans fat, and cholesterol may control cholesterol levels.  Controlling carbohydrates and sugar intake is recommended to manage diabetes.  Reducing calorie intake and making food choices that are low in sodium, saturated fat, trans fat, and cholesterol are recommended to manage obesity.  Maintain a healthy weight.  Stay physically active. It is recommended that you get at least 30 minutes of activity on most or all days.  Limit alcohol use.  Moderate alcohol use is considered to be:  No more than 2 drinks each day for men.  No more than 1 drink each day for nonpregnant women.  Stop drug abuse.  Manage any other health care conditions you may have, if applicable.  A safe home environment is important to reduce the risk of falls. Your health care provider may arrange for specialists to evaluate your home. Having grab bars in the bedroom and bathroom is often important. Your health care provider may arrange for special equipment to be used at home, such as raised toilets and a seat for the shower.  Physical, occupational, and speech therapy. Ongoing therapy may be needed to maximize your recovery after a stroke. If you have been advised to use a walker or a cane, use it at all times. Be sure to keep your therapy appointments.  Follow all instructions for follow-up with your health care provider. This is very important. This includes any referrals, physical therapy, rehabilitation, and laboratory tests. Proper follow-up can prevent another stroke from occurring. SEEK MEDICAL CARE IF:  You have personality changes.  You have difficulty swallowing.  You are seeing double.  You have dizziness.  You have a fever.  You have skin breakdown. SEEK IMMEDIATE MEDICAL CARE IF:   You have a sudden, severe headache with no known cause.  You have sudden nausea or vomiting with  a severe headache.  You have sudden weakness or numbness of the face, arm, or leg, especially on one side of the body.  You have sudden trouble walking or difficulty moving arms or legs.  You have sudden confusion.  You have trouble speaking (aphasia) or understanding.  You have sudden trouble seeing in one or both eyes.  You have a sudden loss of balance or coordination.  You have a stiff neck.  You have difficulty breathing.  You have a partial or total loss of consciousness. Any of these symptoms may represent a serious problem that is an  emergency. Do not wait to see if the symptoms will go away. Get medical help at once. Call your local emergency services (911 in [place] Do not drive yourself to the hospital. Document Released: 12/19/2009 Document Revised: 11/15/2013 Document Reviewed: 02/09/2012 [facility] Patient Information 2015 [place], [place]. This information is not intended to replace advice given to you by your health care provider. Make sure you discuss any questions you have with your health care provider.   Migraine Headache A migraine headache is an intense, throbbing pain on one or both sides of your head. A migraine can last for 30 minutes to several hours. CAUSES  The exact cause of a migraine headache is not always known. However, a migraine may be caused when nerves in the brain become irritated and release chemicals that cause inflammation. This causes pain. Certain things may also trigger migraines, such as:  Alcohol.  Smoking.  Stress.  Menstruation.  Aged cheeses.  Foods or drinks that contain nitrates, glutamate, aspartame, or tyramine.  Lack of sleep.  Chocolate.  Caffeine.  Hunger.  Physical exertion.  Fatigue.  Medicines used to treat chest pain (nitroglycerine), birth control pills, estrogen, and some blood pressure medicines. SIGNS AND SYMPTOMS  Pain on one or both sides of your head.  Pulsating or throbbing pain.  Severe pain that prevents daily activities.  Pain that is aggravated by any physical activity.  Nausea, vomiting, or both.  Dizziness.  Pain with exposure to bright lights, loud noises, or activity.  General sensitivity to bright lights, loud noises, or smells. Before you get a migraine, you may get warning signs that a migraine is coming (aura). An aura may include:  Seeing flashing lights.  Seeing bright spots, halos, or zigzag lines.  Having tunnel vision or blurred vision.  Having feelings of numbness or tingling.  Having trouble talking.  Having  muscle weakness. DIAGNOSIS  A migraine headache is often diagnosed based on:  Symptoms.  Physical exam.  A CT scan or MRI of your head. These imaging tests cannot diagnose migraines, but they can help rule out other causes of headaches. TREATMENT Medicines may be given for pain and nausea. Medicines can also be given to help prevent recurrent migraines.  HOME CARE INSTRUCTIONS  Only take over-the-counter or prescription medicines for pain or discomfort as directed by your health care provider. The use of long-term narcotics is not recommended.  Lie down in a dark, quiet room when you have a migraine.  Keep a journal to find out what may trigger your migraine headaches. For example, write down:  What you eat and drink.  How much sleep you get.  Any change to your diet or medicines.  Limit alcohol consumption.  Quit smoking if you smoke.  Get 7-9 hours of sleep, or as recommended by your health care provider.  Limit stress.  Keep lights dim if bright lights bother you and make  your migraines worse. SEEK IMMEDIATE MEDICAL CARE IF:   Your migraine becomes severe.  You have a fever.  You have a stiff neck.  You have vision loss.  You have muscular weakness or loss of muscle control.  You start losing your balance or have trouble walking.  You feel faint or pass out.  You have severe symptoms that are different from your first symptoms. MAKE SURE YOU:   Understand these instructions.  Will watch your condition.  Will get help right away if you are not doing well or get worse. Document Released: 07/01/2005 Document Revised: 11/15/2013 Document Reviewed: 03/08/2013 [facility] Patient Information 2015 [place], [place]. This information is not intended to replace advice given to you by your health care provider. Make sure you discuss any questions you have with your health care provider.

## 2015-04-06 NOTE — Consult Note (Signed)
NEURO HOSPITALIST CONSULT NOTE   Referring physician: Oleta Mouse   Reason for Consult: HA S/P ICH  HPI:                                                                                                                                          [name] [name] is an 81 y.o. female who was diagnosed with a left [place] 03-25-15.  Since that date patient has been suffering from a bilateral frontal HA that will come and go.  Over the last 10 days her HA has improved and she has not been taking any medication for her HA. Yesterday she had a constant HA 5/10 with pressure over her forehead. She did feel when reading the vision in her left eye had smaller letters than the right eye but this has cleared.  This AM she awoke with worsening HA but at current time it is a 2/10.  She admits to being very anxious about the HA and about her BP increase when EMS arrived.  Currently her BP is 121/76.  CT head was obtained in ED showing no interval increase in blood and actually had decreased compared to prior exam. ED MD asked neurology to see incase further treatment was warranted.   Past Medical History  Diagnosis Date  . Persistent atrial fibrillation   . sinus node dysfunction//post termination pauses   . HTN (hypertension)   . CAD (coronary artery disease)     LHC 5/08:  pOM 20%, pRCA 20-30%, EF 60%  . NASH (nonalcoholic steatohepatitis)   . PMR (polymyalgia rheumatica)     no steriods for 5 years  . HLD (hyperlipidemia)   . Neuromuscular disorder   . Hepatitis     hx of medication induced hepatitis (Vytorin per pt)  . Ascending aorta enlargement     4.8 cm per echo 08/07/11; 5.0 by CT in Jan 2013  . Pacemaker-Medtronic 11/12/2011    Implanted 2013   . Hx of echocardiogram     echo 1/13: EF 55%, Gr 2 diast dysfn, Asc Ao aneurysm 4.8 cm, mild MR, mod LAE, mild RAE  . ICH (intracerebral hemorrhage) 03/24/15    Past Surgical History  Procedure Laterality Date  . Umbilical hernia repair  1950's   . Incision and drainage breast abscess  1973  . Cardiac catheterization    . Hernia repair    . Pacemaker insertion  Jan 2013  . Cardioversion  03/02/2012    Procedure: CARDIOVERSION;  Surgeon: Lelon Perla, MD;  Location: [facility] ENDOSCOPY;  Service: Cardiovascular;  Laterality: N/A;  . Cardioversion  03/13/2012    Procedure: CARDIOVERSION;  Surgeon: Josue Hector, MD;  Location: [facility] ENDOSCOPY;  Service: Cardiovascular;  Laterality: N/A;  . Cardioversion  04/15/2012    Procedure: CARDIOVERSION;  Surgeon: [name], MD;  Location: [facility] ENDOSCOPY;  Service: Cardiovascular;  Laterality: N/A;  . Cardioversion N/A 06/07/2014    Procedure: CARDIOVERSION;  Surgeon: [name], MD;  Location: [place];  Service: Cardiovascular;  Laterality: N/A;  . Permanent pacemaker insertion N/A 07/29/2011    Procedure: PERMANENT PACEMAKER INSERTION;  Surgeon: [name], MD;  Location: [facility] CATH LAB;  Service: Cardiovascular;  Laterality: N/A;  . Cardioversion N/A 08/18/2014    Procedure: CARDIOVERSION;  Surgeon: [name], MD;  Location: [facility] ENDOSCOPY;  Service: Cardiovascular;  Laterality: N/A;    Family History  Problem Relation Age of Onset  . Coronary artery disease    . Alzheimer's disease       Social History:  reports that she quit smoking about 24 years ago. Her smoking use included Cigarettes. She has never used smokeless tobacco. She reports that she does not drink alcohol or use illicit drugs.  Allergies  Allergen Reactions  . Penicillins Anaphylaxis  . Statins Other (See Comments)    REACTION: elevated LFT's  . Tylenol [Acetaminophen] Other (See Comments)    If taken with Vytorin at risk for liver damage  . Tape Other (See Comments)    Redness, Please use "paper" tape only.  . Codeine Other (See Comments)    REACTION: feel funny    MEDICATIONS:                                                                                                                     No  current facility-administered medications for this encounter.   Current Outpatient Prescriptions  Medication Sig Dispense Refill  . amiodarone (PACERONE) 200 MG tablet Take 1 tablet (200 mg total) by mouth daily. 30 tablet 6  . Calcium Carbonate-Vitamin D (CALTRATE 600+D PO) Take 1 tablet by mouth 2 (two) times daily.    . cetirizine (ZYRTEC) 10 MG tablet Take 10 mg by mouth daily.     . Cholecalciferol (VITAMIN D3) 1000 UNITS tablet Take 1,000 Units by mouth at bedtime.     . furosemide (LASIX) 20 MG tablet Take 1 tablet (20 mg total) by mouth daily. 30 tablet 12  . multivitamin (THERAGRAN) per tablet Take 1 tablet by mouth at bedtime.     . nadolol (CORGARD) 40 MG tablet TAKE THREE TABLETS BY MOUTH EVERY EVENING 90 tablet 6  . potassium chloride SA (K-DUR,KLOR-CON) 20 MEQ tablet Take 2 tablets (40 mEq total) by mouth daily. (Patient taking differently: Take 20 mEq by mouth 2 (two) times daily. ) 60 tablet 5  . traMADol (ULTRAM) 50 MG tablet Take 50 mg by mouth 2 (two) times daily as needed for moderate pain.    . valsartan (DIOVAN) 320 MG tablet Take 1 tablet (320 mg total) by mouth daily. 30 tablet 1  . WELCHOL 625 MG tablet Take 625-1,250 mg by mouth 2 (two) times daily. Take 625 mg every morning and 1250 mg at bedtime.        ROS:  History obtained from the patient  General ROS: negative for - chills, fatigue, fever, night sweats, weight gain or weight loss Psychological ROS: negative for - behavioral disorder, hallucinations, memory difficulties, mood swings or suicidal ideation Ophthalmic ROS: negative for - blurry vision, double vision, eye pain or loss of vision ENT ROS: negative for - epistaxis, nasal discharge, oral lesions, sore throat, tinnitus or vertigo Allergy and Immunology ROS: negative for - hives or itchy/watery eyes Hematological and  Lymphatic ROS: negative for - bleeding problems, bruising or swollen lymph nodes Endocrine ROS: negative for - galactorrhea, hair pattern changes, polydipsia/polyuria or temperature intolerance Respiratory ROS: negative for - cough, hemoptysis, shortness of breath or wheezing Cardiovascular ROS: negative for - chest pain, dyspnea on exertion, edema or irregular heartbeat Gastrointestinal ROS: negative for - abdominal pain, diarrhea, hematemesis, nausea/vomiting or stool incontinence Genito-Urinary ROS: negative for - dysuria, hematuria, incontinence or urinary frequency/urgency Musculoskeletal ROS: negative for - joint swelling or muscular weakness Neurological ROS: as noted in HPI Dermatological ROS: negative for rash and skin lesion changes   Blood pressure 138/57, pulse 61, temperature 98.1 F (36.7 C), temperature source Oral, resp. rate 15, SpO2 98 %.   Neurologic Examination:                                                                                                      HEENT-  Normocephalic, no lesions, without obvious abnormality.  Normal external eye and conjunctiva.  Normal TM's bilaterally.  Normal auditory canals and external ears. Normal external nose, mucus membranes and septum.  Normal pharynx. Cardiovascular- regular rate and rhythm, pulses palpable throughout   Lungs- chest clear, no wheezing, rales, normal symmetric air entry Abdomen- normal findings: bowel sounds normal Extremities- no joint deformities, effusion, or inflammation Lymph-no adenopathy palpable Musculoskeletal-no joint tenderness, deformity or swelling Skin-warm and dry, no hyperpigmentation, vitiligo, or suspicious lesions  Neurological Examination Mental Status: Alert, oriented, thought content appropriate.  Speech fluent without evidence of aphasia.  Able to follow 3 step commands without difficulty. Cranial Nerves: II: Discs flat bilaterally; Visual fields grossly normal, pupils equal, round,  reactive to light and accommodation III,IV, VI: ptosis not present, extra-ocular motions intact bilaterally V,VII: smile symmetric, facial light touch sensation normal bilaterally VIII: hearing normal bilaterally IX,X: uvula rises symmetrically XI: bilateral shoulder shrug XII: midline tongue extension Motor: Right : Upper extremity   5/5    Left:     Upper extremity   5/5  Lower extremity   4/5     Lower extremity   5/5 Tone and bulk:normal tone throughout; no atrophy noted Sensory: Pinprick and light touch intact throughout, bilaterally Deep Tendon Reflexes: 2+ and symmetric throughout UE and bilateral KJ no AJ bilaterally Plantars: Right: downgoing   Left: downgoing Cerebellar: normal finger-to-nose and normal heel-to-shin test Gait: not tested due to multiple leads.       Lab Results: Basic Metabolic Panel:  Recent Labs Lab 04/01/15 1135 04/06/15 0930  NA 140 142  K 3.9 3.9  CL 107 109  CO2 24 24  GLUCOSE 100* 98  BUN 15  15  CREATININE 0.88 0.92  CALCIUM 9.2 9.7    Liver Function Tests:  Recent Labs Lab 04/06/15 0930  AST 22  ALT 19  ALKPHOS 67  BILITOT 0.8  PROT 6.7  ALBUMIN 3.3*   No results for input(s): LIPASE, AMYLASE in the last 168 hours. No results for input(s): AMMONIA in the last 168 hours.  CBC:  Recent Labs Lab 04/01/15 1135 04/06/15 0930  WBC 6.2 5.7  NEUTROABS 4.4 3.8  HGB 13.7 14.3  HCT 41.6 43.6  MCV 96.3 96.0  PLT 167 168    Cardiac Enzymes:  Recent Labs Lab 04/01/15 1135  TROPONINI <0.03    Lipid Panel: No results for input(s): CHOL, TRIG, HDL, CHOLHDL, VLDL, LDLCALC in the last 168 hours.  CBG:  Recent Labs Lab 04/06/15 0923  GLUCAP 3    Microbiology: Results for orders placed or performed during the hospital encounter of 03/25/15  MRSA PCR Screening     Status: None   Collection Time: 03/25/15  6:46 PM  Result Value Ref Range Status   MRSA by PCR NEGATIVE NEGATIVE Final    Comment:        The  GeneXpert MRSA Assay (FDA approved for NASAL specimens only), is one component of a comprehensive MRSA colonization surveillance program. It is not intended to diagnose MRSA infection nor to guide or monitor treatment for MRSA infections.     Coagulation Studies:  Recent Labs  04/06/15 0930  LABPROT 14.0  INR 1.06    Imaging: Ct Head Wo Contrast  04/06/2015   CLINICAL DATA:  Headache, left arm heaviness. Intracranial hemorrhage.  EXAM: CT HEAD WITHOUT CONTRAST  TECHNIQUE: Contiguous axial images were obtained from the base of the skull through the vertex without intravenous contrast.  COMPARISON:  CT scan of April 01, 2015.  FINDINGS: Bony calvarium appears intact. Mild diffuse cortical atrophy is noted. Ventricular size is within normal limits. No midline shift is noted. There is continued presence of intraparenchymal hemorrhage involving the left posterior parietal cortex which currently measures 23 x 15 mm and is slightly decreased compared to prior exam. Stable amount of surrounding white matter edema is noted. Smaller focal hemorrhage is noted more superiorly toward the vertex which is unchanged compared to prior exam.  IMPRESSION: Continued presence of intraparenchymal hemorrhage in left posterior parietal cortex with stable amount of surrounding white matter edema. Stable small satellite hemorrhage is noted more superiorly toward the vertex. No significant midline shift is noted.   Electronically Signed   By: Marijo Conception, M.D.   On: 04/06/2015 10:09    Etta Quill PA-C [facility] [phone]  04/06/2015, 11:11 AM  Patient seen and examined.  Clinical course and management discussed.  Necessary edits performed.  I agree with the above.  Assessment and plan of care developed and discussed below.     Assessment/Plan: 81 year old female presenting with complaints of headache.  Patient has had a recent intracranial hemorrhage with extension.  Repeat head CT  today reviewed and shows no extension of hemorrhage or new hemorrhage.  Patient unable to take NSAIDS or ASA for pain due to hemorrhage.  Unable to take Tylenol due to NASH.  Would not recommend Ultram due to decreased seizure threshold with use.    Recommendations: 1.  Patient to return to PCP to determine acceptable pain med.  No meds required at this time due to improvement in headache. No further intervention neurologically required at this time.     Magda Paganini  Doy Mince, [place] [facility] [phone]  04/06/2015  3:01 PM

## 2015-04-06 NOTE — ED Provider Notes (Signed)
CSN: [id number]     Arrival date & time 04/06/15  4235 History   First MD Initiated Contact with Patient 04/06/15 [phone]     Chief Complaint  Patient presents with  . Headache     (Consider location/radiation/quality/duration/timing/severity/associated sxs/prior Treatment) HPI 81 year old female who presents with headache. She has a history of sinus node dysfunction and atrial fibrillation status post pacemaker placement, CAD, hypertension, hyperlipidemia, polymyalgia rheumatica and thoracic aortic aneurysm. She was recently admitted into the hospital in early September for management of a left parietal hematoma. Since then she has returned to our emergency department twice within the past 2 weeks with recurrent headaches and elevated BP. Yesterday afternoon began to developed bifrontal headache, that gradually worsened over the course of the evening. She did notice that the vision in her right eye seemed distorted, as if things appeared much smaller. She denies any diplopia, vision loss, slurred speech, aphasia, ataxia, vertigo, numbness or weakness. She has not had any fever or chills. She has not had chest pain, shortness of breath, upper respiratory symptoms, GI symptoms, or urinary symptoms.   Past Medical History  Diagnosis Date  . Persistent atrial fibrillation   . sinus node dysfunction//post termination pauses   . HTN (hypertension)   . CAD (coronary artery disease)     LHC 5/08:  pOM 20%, pRCA 20-30%, EF 60%  . NASH (nonalcoholic steatohepatitis)   . PMR (polymyalgia rheumatica)     no steriods for 5 years  . HLD (hyperlipidemia)   . Neuromuscular disorder   . Hepatitis     hx of medication induced hepatitis (Vytorin per pt)  . Ascending aorta enlargement     4.8 cm per echo 08/07/11; 5.0 by CT in Jan 2013  . Pacemaker-Medtronic 11/12/2011    Implanted 2013   . Hx of echocardiogram     echo 1/13: EF 55%, Gr 2 diast dysfn, Asc Ao aneurysm 4.8 cm, mild MR, mod LAE, mild RAE  .  ICH (intracerebral hemorrhage) 03/24/15   Past Surgical History  Procedure Laterality Date  . Umbilical hernia repair  1950's  . Incision and drainage breast abscess  1973  . Cardiac catheterization    . Hernia repair    . Pacemaker insertion  Jan 2013  . Cardioversion  03/02/2012    Procedure: CARDIOVERSION;  Surgeon: Lelon Perla, MD;  Location: [place];  Service: Cardiovascular;  Laterality: N/A;  . Cardioversion  03/13/2012    Procedure: CARDIOVERSION;  Surgeon: Josue Hector, MD;  Location: [facility] ENDOSCOPY;  Service: Cardiovascular;  Laterality: N/A;  . Cardioversion  04/15/2012    Procedure: CARDIOVERSION;  Surgeon: [name], MD;  Location: [facility] ENDOSCOPY;  Service: Cardiovascular;  Laterality: N/A;  . Cardioversion N/A 06/07/2014    Procedure: CARDIOVERSION;  Surgeon: [name], MD;  Location: [place];  Service: Cardiovascular;  Laterality: N/A;  . Permanent pacemaker insertion N/A 07/29/2011    Procedure: PERMANENT PACEMAKER INSERTION;  Surgeon: [name], MD;  Location: [facility] CATH LAB;  Service: Cardiovascular;  Laterality: N/A;  . Cardioversion N/A 08/18/2014    Procedure: CARDIOVERSION;  Surgeon: [name], MD;  Location: [facility] ENDOSCOPY;  Service: Cardiovascular;  Laterality: N/A;   Family History  Problem Relation Age of Onset  . Coronary artery disease    . Alzheimer's disease     Social History  Substance Use Topics  . Smoking status: Former Smoker    Types: Cigarettes    Quit date: 07/16/1990  . Smokeless tobacco: Never  Used  . Alcohol Use: No   OB History    No data available     Review of Systems 10/14 systems reviewed and are negative other than those stated in the HPI   Allergies  Penicillins; Statins; Tylenol; Tape; and Codeine  Home Medications   Prior to Admission medications   Medication Sig Start Date End Date Taking? Authorizing Provider  amiodarone (PACERONE) 200 MG tablet Take 1 tablet (200 mg total) by mouth daily.  08/23/14  Yes Burtis Junes, NP  Calcium Carbonate-Vitamin D (CALTRATE 600+D PO) Take 1 tablet by mouth 2 (two) times daily.   Yes Historical Provider, MD  cetirizine (ZYRTEC) 10 MG tablet Take 10 mg by mouth daily.    Yes Historical Provider, MD  Cholecalciferol (VITAMIN D3) 1000 UNITS tablet Take 1,000 Units by mouth at bedtime.    Yes Historical Provider, MD  furosemide (LASIX) 20 MG tablet Take 1 tablet (20 mg total) by mouth daily. 03/29/15  Yes Rosalin Hawking, MD  multivitamin [facility]) per tablet Take 1 tablet by mouth at bedtime.    Yes Historical Provider, MD  nadolol (CORGARD) 40 MG tablet TAKE THREE TABLETS BY MOUTH EVERY EVENING 03/27/15  Yes Lendon Colonel, NP  potassium chloride SA (K-DUR,KLOR-CON) 20 MEQ tablet Take 2 tablets (40 mEq total) by mouth daily. Patient taking differently: Take 20 mEq by mouth 2 (two) times daily.  11/29/14  Yes [name], MD  traMADol (ULTRAM) 50 MG tablet Take 50 mg by mouth 2 (two) times daily as needed for moderate pain.   Yes Historical Provider, MD  valsartan (DIOVAN) 320 MG tablet Take 1 tablet (320 mg total) by mouth daily. 02/21/15  Yes [name], MD  [facility] 625 MG tablet Take 625-1,250 mg by mouth 2 (two) times daily. Take 625 mg every morning and 1250 mg at bedtime. 05/08/12  Yes Historical Provider, MD   BP 128/48 mmHg  Pulse 60  Temp(Src) 98.1 F (36.7 C) (Oral)  Resp 14  SpO2 97% Physical Exam Physical Exam  Nursing note and vitals reviewed. Constitutional: Elderly woman, appearing well developed, well nourished, non-toxic, and in no acute distress Head: Normocephalic and atraumatic. No tenderness over temporal arteries. Mouth/Throat: Oropharynx is clear and moist.  Neck: Normal range of motion. Neck supple. No mengingismus Cardiovascular: Normal rate and regular rhythm.   Pulmonary/Chest: Effort normal and breath sounds normal.  Abdominal: Soft. There is no tenderness. There is no rebound and no guarding.  Musculoskeletal:  Normal range of motion.  Skin: Skin is warm and dry.  Psychiatric: Cooperative Neurological:  Alert, oriented to person, place, time, and situation. Memory grossly in tact. Fluent speech. No dysarthria or aphasia.  Cranial nerves: VF are full. Pupils are symmetric, and reactive to light. EOMI without nystagmus. No gaze deviation. Facial muscles symmetric with activation. Sensation to light touch over face in tact bilaterally. Hearing grossly in tact. Palate elevates symmetrically. Head turn and shoulder shrug are intact. Tongue midline.  Muscle bulk and tone normal. No pronator drift. Moves all extremities symmetrically. Sensation to light touch is in tact throughout in bilateral upper and lower extremities. Coordination reveals no dysmetria with finger to nose.     ED Course  Procedures (including critical care time) Labs Review Labs Reviewed  SEDIMENTATION RATE - Abnormal; Notable for the following:    Sed Rate 30 (*)    All other components within normal limits  COMPREHENSIVE METABOLIC PANEL - Abnormal; Notable for the following:  Albumin 3.3 (*)    GFR calc non Af Amer 57 (*)    All other components within normal limits  CBC WITH DIFFERENTIAL/PLATELET  PROTIME-INR  APTT  C-REACTIVE PROTEIN  CBG MONITORING, ED  I-STAT TROPOININ, ED  TYPE AND SCREEN  ABO/RH    Imaging Review Ct Head Wo Contrast  04/06/2015   CLINICAL DATA:  Headache, left arm heaviness. Intracranial hemorrhage.  EXAM: CT HEAD WITHOUT CONTRAST  TECHNIQUE: Contiguous axial images were obtained from the base of the skull through the vertex without intravenous contrast.  COMPARISON:  CT scan of April 01, 2015.  FINDINGS: Bony calvarium appears intact. Mild diffuse cortical atrophy is noted. Ventricular size is within normal limits. No midline shift is noted. There is continued presence of intraparenchymal hemorrhage involving the left posterior parietal cortex which currently measures 23 x 15 mm and is slightly  decreased compared to prior exam. Stable amount of surrounding white matter edema is noted. Smaller focal hemorrhage is noted more superiorly toward the vertex which is unchanged compared to prior exam.  IMPRESSION: Continued presence of intraparenchymal hemorrhage in left posterior parietal cortex with stable amount of surrounding white matter edema. Stable small satellite hemorrhage is noted more superiorly toward the vertex. No significant midline shift is noted.   Electronically Signed   By: Marijo Conception, M.D.   On: 04/06/2015 10:09   I have personally reviewed and evaluated these images and lab results as part of my medical decision-making.   EKG Interpretation   Date/Time:  Thursday April 06 2015 10:11:40 EDT Ventricular Rate:  62 PR Interval:  70 QRS Duration: 122 QT Interval:  480 QTC Calculation: 487 R Axis:   -39 Text Interpretation:  Atrial-paced complexes Left bundle branch block No  significant change since last tracing Confirmed by LIU MD, Hinton Dyer ([id number]) on  04/06/2015 12:27:25 PM      MDM   Final diagnoses:  Nonintractable headache, unspecified chronicity pattern, unspecified headache type  Intracranial hemorrhage    81 year old female with known intraparenchymal hemorrhage, atrial fibrillation and sinus node dysfunction status post patent pacemaker, and CAD who presents with headache. She is nontoxic in no acute distress on presentation. On my evaluation, she said that her headache had now fully resolved, and she did not have any complaints of neurological deficits. Her neuro exam is currently intact. Bedside EKG was concerning for deep T-wave inversions in the lateral leads, in the setting of her known intraparenchymal hemorrhage, was felt to be consistent with cerebral T-wave inversions. She has no chest pain, difficulty breathing, fatigue, or any other anginal equivalent to make me think that this is cardiac in nature. On repeat EKG this is resolved. CT head was  performed, showing stable intraparenchymal hemorrhage and no other acute findings. I did discuss this with Dr. Doy Mince from neurology. Given stable hematoma, no additional workup or treatment is felt necessary. Blood work is reviewed and otherwise unremarkable. Given history of polymyalgia rheumatica she is at increased risk of temporal arteritis, but no pain over her temporal arteries and no systemic symptoms to make me feel that this is consistent with that at this time. Patient will follow-up closely with her primary care doctor to discuss further management of her headache. Strict return instructions are also reviewed. She expressed understanding of all discharge instructions and felt comfortable with the plan of care.    Forde Dandy, MD 04/06/15 [phone]

## 2015-04-06 NOTE — ED Notes (Signed)
Per [facility] pt woke up this morning with a bad headache and heaviness to her left arm.  She complained of not feeling well and having a weird feeling around her mouth.  The headache is now gone.  Pt had a stroke on the 10th of this month.  VS are as follows: BP: 190/104 HR:80 Resp: 20

## 2015-04-17 ENCOUNTER — Telehealth: Payer: Self-pay | Admitting: Neurology

## 2015-04-17 NOTE — Telephone Encounter (Signed)
Patient called [phone] to advise, Dr Erlinda Hong ordered CT scan for 05/01/15, since seeing Dr. Erlinda Hong in the hospital, she has had 2 additional scans. CTscan on 04/01/15 and CT scan on 04/06/15. Patient is wondering if she even needs the CT scan on 05/01/15.

## 2015-04-19 NOTE — Telephone Encounter (Signed)
Dr Erlinda Hong spoke to patient on his nurse phone. Dr. Erlinda Hong gave patient advice about holding off on her CAT scan until he sees her.

## 2015-04-19 NOTE — Telephone Encounter (Signed)
I tried to reach her today by calling her home phone and cell phone numbers but she was not available. Did not leave voicemail due to [place] regulations.   Katrina, could you please let her know that is okay to hold off CAT scan for now and I will see her on 05/02/2015 as scheduled? Thank you.  Rosalin Hawking, MD PhD Stroke Neurology 04/19/2015 8:45 AM

## 2015-04-25 ENCOUNTER — Other Ambulatory Visit: Payer: Self-pay | Admitting: Internal Medicine

## 2015-05-01 ENCOUNTER — Other Ambulatory Visit: Payer: Medicare Other

## 2015-05-02 ENCOUNTER — Ambulatory Visit (INDEPENDENT_AMBULATORY_CARE_PROVIDER_SITE_OTHER): Payer: Medicare Other | Admitting: Neurology

## 2015-05-02 ENCOUNTER — Encounter: Payer: Self-pay | Admitting: Neurology

## 2015-05-02 VITALS — BP 114/68 | HR 67 | Ht 66.0 in | Wt 174.6 lb

## 2015-05-02 DIAGNOSIS — I480 Paroxysmal atrial fibrillation: Secondary | ICD-10-CM

## 2015-05-02 DIAGNOSIS — I611 Nontraumatic intracerebral hemorrhage in hemisphere, cortical: Secondary | ICD-10-CM | POA: Diagnosis not present

## 2015-05-02 DIAGNOSIS — Z950 Presence of cardiac pacemaker: Secondary | ICD-10-CM | POA: Diagnosis not present

## 2015-05-02 DIAGNOSIS — Z7901 Long term (current) use of anticoagulants: Secondary | ICD-10-CM | POA: Diagnosis not present

## 2015-05-02 MED ORDER — APIXABAN 5 MG PO TABS: 5.0000 mg | ORAL_TABLET | Freq: Two times a day (BID) | ORAL | Status: DC

## 2015-05-02 NOTE — Patient Instructions (Addendum)
-   will start eliquis 64m twice a day for stroke prevention - check with your pharmacy about the cost of eliquis - follow up with cardiology for afib management - I will forward note to Dr. KCaryl Comesto let him know the changes. - check BP at home - Follow up with your primary care physician for stroke risk factor modification. Recommend maintain blood pressure goal <130/80, diabetes with hemoglobin A1c goal below 6.5% and lipids with LDL cholesterol goal below 70 mg/dL.  - follow up in 4 months.

## 2015-05-03 ENCOUNTER — Telehealth: Payer: Self-pay | Admitting: Neurology

## 2015-05-03 DIAGNOSIS — I611 Nontraumatic intracerebral hemorrhage in hemisphere, cortical: Secondary | ICD-10-CM | POA: Insufficient documentation

## 2015-05-03 DIAGNOSIS — Z950 Presence of cardiac pacemaker: Secondary | ICD-10-CM | POA: Insufficient documentation

## 2015-05-03 DIAGNOSIS — Z7901 Long term (current) use of anticoagulants: Secondary | ICD-10-CM | POA: Insufficient documentation

## 2015-05-03 NOTE — Telephone Encounter (Signed)
Pt called stating Ins needs prior auth for apixaban (ELIQUIS) 5 MG TABS table . She said samples would run out on Monday and will need more to hold her until she can get RX. Please call and advise at [phone].

## 2015-05-03 NOTE — Telephone Encounter (Signed)
Pt called and she spoke with pharmacy and prior auth is approved. She does not need samples or free card

## 2015-05-03 NOTE — Telephone Encounter (Signed)
Ins has been contacted and provided with clinical info.  Request is under review Ref # [id number]  Ins has approved the request for coverage on Eliquis effective until 05/01/2016 Ref # [id number].  Patient and pharmacy have been notified.

## 2015-05-03 NOTE — Progress Notes (Signed)
STROKE NEUROLOGY FOLLOW UP NOTE  NAME: [name] [name] DOB: 12-Dec-1930  REASON FOR VISIT: stroke follow up HISTORY FROM: pt and chart  Today we had the pleasure of seeing [name] [name] in follow-up at our Neurology Clinic. Pt was accompanied by husband.   History Summary [name] [name] is a 81 y.o. Female with hx of a-fib on coumadin s/p pacemaker, HTN, CAD, HLD admitted on 03/25/15 for L parietal ICH. INR 2.35, reversed with Kcentra. INR down to 1.13. Symptoms resolved and repeat CT stable. Her BP was low while in hospital and her BP meds has to be hold initially and put back gradually as BP getting up. Still holding off hydralazine on discharge. Plan to repeat CT in one month and restart AC. Pt is discharged in good condition.  Interval History During the interval time, the patient has been doing well. However, she went to ER on 04/01/15 for elevated BP and got CT head showed evolving ICH. She went to ER again on 04/06/15 for frontal HA, got CT head again for further evolving ICH. She follows with Dr. Caryl Comes in clinic for afib, on amiodarone and nadolol. Currently BP 120-130 at home. Today BP 114/68. She has thoracic aorta aneurysm, in 07/2011 was 4.8cm and in 03/2015 it showed 4.4 cm.   REVIEW OF SYSTEMS: Full 14 system review of systems performed and notable only for those listed below and in HPI above, all others are negative:  Constitutional:   Cardiovascular:  Ear/Nose/Throat:   Skin:  Eyes:   Respiratory:   Gastroitestinal:   Genitourinary:  Hematology/Lymphatic:  Easy bruising Endocrine:  Musculoskeletal:   Allergy/Immunology:   Neurological:  HA Psychiatric:  Sleep:   The following represents the patient's updated allergies and side effects list: Allergies  Allergen Reactions  . Penicillins Anaphylaxis  . Statins Other (See Comments)    REACTION: elevated LFT's  . Tylenol [Acetaminophen] Other (See Comments)    If taken with Vytorin at risk for liver damage  .  Tape Other (See Comments)    Redness, Please use "paper" tape only.  . Codeine Other (See Comments)    REACTION: feel funny    The neurologically relevant items on the patient's problem list were reviewed on today's visit.  Neurologic Examination  A problem focused neurological exam (12 or more points of the single system neurologic examination, vital signs counts as 1 point, cranial nerves count for 8 points) was performed.  Blood pressure 114/68, pulse 67, height 5' 6"  (1.676 m), weight 174 lb 9.6 oz (79.198 kg).  General - Well nourished, well developed, in no apparent distress.  Ophthalmologic - Sharp disc margins OU.   Cardiovascular - Regular rate and rhythm, not in afib rhythm.  Mental Status -  Level of arousal and orientation to time, place, and person were intact. Language including expression, naming, repetition, comprehension was assessed and found intact. Fund of Knowledge was assessed and was intact.  Cranial Nerves II - XII - II - Visual field intact OU. III, IV, VI - Extraocular movements intact. V - Facial sensation intact bilaterally. VII - Facial movement intact bilaterally. VIII - Hearing & vestibular intact bilaterally. X - Palate elevates symmetrically. XI - Chin turning & shoulder shrug intact bilaterally. XII - Tongue protrusion intact.  Motor Strength - The patient's strength was normal in all extremities and pronator drift was absent.  Bulk was normal and fasciculations were absent.   Motor Tone - Muscle tone was assessed at the neck  and appendages and was normal.  Reflexes - The patient's reflexes were 1+ in all extremities and she had no pathological reflexes.  Sensory - Light touch, temperature/pinprick were assessed and were normal.    Coordination - The patient had normal movements in the hands and feet with no ataxia or dysmetria.  Tremor was absent.  Gait and Station - difficulty getting up from chair, slow and cautious gait, but  steady.  Data reviewed: I personally reviewed the images and agree with the radiology interpretations.  Ct Head Wo Contrast  03/26/2015 IMPRESSION: Slight interval decrease in size of left occipital intraparenchymal hemorrhage. No new acute intracranial finding.   03/26/2015 IMPRESSION: Stable LEFT parietal parasagittal intraparenchymal hematoma as described. No significant increase in hematoma or edema compared with yesterday's CT.   03/25/2015 IMPRESSION: Acute left posterior parietal intraparenchymal hemorrhage is noted.   04/01/15 - Slight increase in the size of left posterior parietal hematoma with development of 6 mm satellite hemorrhage as well, new from most recent prior study.  04/06/15 - Continued presence of intraparenchymal hemorrhage in left posterior parietal cortex with stable amount of surrounding white matter edema. Stable small satellite hemorrhage is noted more superiorly toward the vertex. No significant midline shift is noted.  2D echo - - Compared to a prior echo in 2013, there are few changes. Pacemaker wires are now noted. There is no longer a pericadrial effusion. The ascending aorta is dilated to 4.4 cm at the most distally visualized point. Consider dedicated imaging of the aorta wtih CT. There is moderate TR with RVSP of 42 mmHg, the RA pressure is estimated at 8 mmHg.  Component     Latest Ref Rng 03/27/2015  Cholesterol     0 - 200 mg/dL 181  Triglycerides     <150 mg/dL 96  HDL Cholesterol     >40 mg/dL 64  Total CHOL/HDL Ratio      2.8  VLDL     0 - 40 mg/dL 19  LDL (calc)     0 - 99 mg/dL 98  Hemoglobin A1C     4.8 - 5.6 % 5.5  Mean Plasma Glucose      111    Assessment: As you may recall, she is a 81 y.o. Caucasian female with PMH of a-fib on coumadin s/p pacemaker, HTN, CAD, HLD admitted on 03/25/15 for L parietal ICH. INR 2.35, reversed with Kcentra. INR down to 1.13. Symptoms resolved and repeat CT stable. AC on  hold. Pt is discharged in good condition. During the interval time, she had two head CT showing evolving hematoma. BP well controlled with meds. No neuro deficit residue. She follows with Dr. Caryl Comes in clinic for afib, on amiodarone and nadolol. She has thoracic aorta aneurysm, in 07/2011 was 4.8cm and in 03/2015 it showed 4.4 cm. Will re-start AC with NOAC to decrease bleeding risk.   Plan:  - will start eliquis 81m twice a day for stroke prevention - follow up with cardiology for afib management - I will forward note to Dr. KCaryl Comesto let him know the changes. - check BP at home - Follow up with your primary care physician for stroke risk factor modification. Recommend maintain blood pressure goal <130/80, diabetes with hemoglobin A1c goal below 6.5% and lipids with LDL cholesterol goal below 70 mg/dL.  - follow up in 4 months.  I spent more than 25 minutes of face to face time with the patient. Greater than 50% of time was spent in counseling  and coordination of care.    No orders of the defined types were placed in this encounter.    Meds ordered this encounter  Medications  . amLODipine (NORVASC) 5 MG tablet    Sig:   . amLODipine (NORVASC) 2.5 MG tablet    Sig:   . ALPRAZolam (XANAX) 0.25 MG tablet    Sig:   . apixaban (ELIQUIS) 5 MG TABS tablet    Sig: Take 1 tablet (5 mg total) by mouth 2 (two) times daily.    Dispense:  60 tablet    Refill:  5    Patient Instructions  - will start eliquis 65m twice a day for stroke prevention - check with your pharmacy about the cost of eliquis - follow up with cardiology for afib management - I will forward note to Dr. KCaryl Comesto let him know the changes. - check BP at home - Follow up with your primary care physician for stroke risk factor modification. Recommend maintain blood pressure goal <130/80, diabetes with hemoglobin A1c goal below 6.5% and lipids with LDL cholesterol goal below 70 mg/dL.  - follow up in 4 months.   JRosalin Hawking MD  PhD [facility] [id number] SNorth Shore[place]Time [place] [phone]

## 2015-05-03 NOTE — Telephone Encounter (Signed)
I have given her one week sample and the one month free card. Pleas tell her that she should get one month free from pharmacy and at the same time working with pre [place]. Thanks.  Rosalin Hawking, MD PhD Stroke Neurology 05/03/2015 9:38 AM

## 2015-05-10 ENCOUNTER — Telehealth: Payer: Self-pay | Admitting: Internal Medicine

## 2015-05-10 NOTE — Telephone Encounter (Signed)
New Message  Pt c/o medication issue:  1. Name of Medication: Eliquis 2. Are you having a reaction (difficulty breathing--STAT)?  stroke last month. 3. What is your medication issue? Pt called states that she had a stroke last month. Pt states that she has taken coumadin for years and was recently placed on Eliquis. She states that this was supposed to be discuss with Dr. Caryl Comes per her Neurologist to see if being on Eliquis is better than coumadin. Pt is requesting a call back from the nurse to discuss.

## 2015-05-10 NOTE — Telephone Encounter (Signed)
I left a message for the patient that I will forward to Dr. Caryl Comes for review and call her back.

## 2015-05-13 NOTE — Telephone Encounter (Signed)
This is a tough question   In the studies the drugs were simimlary effective in pts with prior strokes     I think the bleeding risks are lower with eliquis for similar stgroke reduction benefit   She is on the appropriate dose

## 2015-05-15 ENCOUNTER — Other Ambulatory Visit: Payer: Self-pay | Admitting: Internal Medicine

## 2015-05-16 NOTE — Telephone Encounter (Signed)
I left a message for the patient to call. 

## 2015-05-22 NOTE — Telephone Encounter (Signed)
I spoke with the patient. She states she actually had a stroke on warfarin. I advised her of Dr. Olin Pia recommendations. She will continue on Eliquis 5 mg BID.

## 2015-06-12 ENCOUNTER — Other Ambulatory Visit: Payer: Self-pay | Admitting: Internal Medicine

## 2015-07-11 ENCOUNTER — Telehealth: Payer: Self-pay | Admitting: Internal Medicine

## 2015-07-11 NOTE — Telephone Encounter (Signed)
Two thoughts--stop amlodipine and  1) stop nadolol and try labetolol 300 bid she has not had in past except IV 2) add clonidine 0.1 bid M(or if can afford TTS-1)    If she is willing prefer 1 as it is fewer meds thnks

## 2015-07-11 NOTE — Telephone Encounter (Signed)
I called and spoke with the patient. She reports that she has had swelling to her feet x 1 month at least. The patient states Dr. Maudie Mercury increased her amlodipine to 7.5 mg once daily over a month ago and she has noticed the swelling since that. She has tried to double her lasix for 2-3 days at a time with no improvement in her swelling.  She does report her BP is 120/60's on the higher dose of amlodipine. The patient states she called Dr. Julianne Rice office, but he is out all week. I advised I would review with Dr. Caryl Comes, but if we take her off her amlodipine, we will need to replace this with something else. I will call her back with Dr. Olin Pia recommendations. She is agreeable.

## 2015-07-11 NOTE — Telephone Encounter (Signed)
New message    Patient calling    Pt c/o swelling: STAT is pt has developed SOB within 24 hours  1. How long have you been experiencing swelling? Off / on for couple of month   2. Where is the swelling located? Both legs & feet   3.  Are you currently taking a "fluid pill"? Yes - 20 mg some days she has taken a double dosage but doesn't seem like it's helping   4.  Are you currently SOB? No   5.  Have you traveled recently? No

## 2015-07-12 ENCOUNTER — Ambulatory Visit (HOSPITAL_COMMUNITY): Payer: Medicare Other | Admitting: Nurse Practitioner

## 2015-07-13 MED ORDER — LABETALOL HCL 300 MG PO TABS: 300.0000 mg | ORAL_TABLET | Freq: Two times a day (BID) | ORAL | Status: DC

## 2015-07-13 NOTE — Telephone Encounter (Signed)
I spoke with the patient.  She states that she forgot she was seeing her PCP this week. She states they told her to decrease amlodipine to 5 mg daily and take lasix x 10 days. She is not as comfortable doing this, so she would like to follow Dr. Olin Pia recommendations to d/c amlodipine, d/c nadolol, and start labetolol 300 mg BID. I advised her I will send this in to [facility].

## 2015-07-13 NOTE — Telephone Encounter (Signed)
I left a message for the patient to call. 

## 2015-07-24 ENCOUNTER — Ambulatory Visit (HOSPITAL_COMMUNITY): Payer: Medicare Other | Admitting: Nurse Practitioner

## 2015-07-24 ENCOUNTER — Telehealth: Payer: Self-pay | Admitting: Internal Medicine

## 2015-07-24 NOTE — Telephone Encounter (Signed)
New message       Talk to the nurse about her medications

## 2015-07-25 NOTE — Telephone Encounter (Signed)
I left a message for the patient to call. 

## 2015-07-25 NOTE — Telephone Encounter (Signed)
Follow Up ° °Pt returned call//  °

## 2015-07-25 NOTE — Telephone Encounter (Signed)
I called and spoke with the patient. She reports today that her feet are still swelling after being off amlodipine since ~07/13/15. She is tolerating her labetalol ok- SBP is 130-140's. The patient is due to follow up with Roderic Palau, NP on 07/27/15. She will review her swelling with Butch Penny at that time.

## 2015-07-27 ENCOUNTER — Ambulatory Visit (HOSPITAL_COMMUNITY)
Admission: RE | Admit: 2015-07-27 | Discharge: 2015-07-27 | Disposition: A | Discharge status: Home / Self Care | Payer: Medicare Other | Source: Ambulatory Visit | Attending: Nurse Practitioner | Admitting: Nurse Practitioner

## 2015-07-27 ENCOUNTER — Encounter (HOSPITAL_COMMUNITY): Payer: Self-pay | Admitting: Nurse Practitioner

## 2015-07-27 VITALS — BP 116/60 | HR 80 | Ht 66.0 in | Wt 178.2 lb

## 2015-07-27 DIAGNOSIS — Z79899 Other long term (current) drug therapy: Secondary | ICD-10-CM | POA: Insufficient documentation

## 2015-07-27 DIAGNOSIS — Z950 Presence of cardiac pacemaker: Secondary | ICD-10-CM | POA: Diagnosis not present

## 2015-07-27 DIAGNOSIS — I480 Paroxysmal atrial fibrillation: Secondary | ICD-10-CM | POA: Diagnosis not present

## 2015-07-27 DIAGNOSIS — Z7902 Long term (current) use of antithrombotics/antiplatelets: Secondary | ICD-10-CM | POA: Diagnosis not present

## 2015-07-27 DIAGNOSIS — I2510 Atherosclerotic heart disease of native coronary artery without angina pectoris: Secondary | ICD-10-CM | POA: Diagnosis not present

## 2015-07-27 DIAGNOSIS — I4891 Unspecified atrial fibrillation: Secondary | ICD-10-CM | POA: Insufficient documentation

## 2015-07-27 DIAGNOSIS — I10 Essential (primary) hypertension: Secondary | ICD-10-CM | POA: Insufficient documentation

## 2015-07-27 DIAGNOSIS — M7989 Other specified soft tissue disorders: Secondary | ICD-10-CM | POA: Insufficient documentation

## 2015-07-27 DIAGNOSIS — Z87891 Personal history of nicotine dependence: Secondary | ICD-10-CM | POA: Insufficient documentation

## 2015-07-27 DIAGNOSIS — M353 Polymyalgia rheumatica: Secondary | ICD-10-CM | POA: Insufficient documentation

## 2015-07-27 DIAGNOSIS — E785 Hyperlipidemia, unspecified: Secondary | ICD-10-CM | POA: Diagnosis not present

## 2015-07-27 DIAGNOSIS — Z880 Allergy status to penicillin: Secondary | ICD-10-CM | POA: Insufficient documentation

## 2015-07-27 DIAGNOSIS — Z8673 Personal history of transient ischemic attack (TIA), and cerebral infarction without residual deficits: Secondary | ICD-10-CM | POA: Diagnosis not present

## 2015-07-27 LAB — COMPREHENSIVE METABOLIC PANEL
ALT: 17 U/L (ref 14–54)
AST: 25 U/L (ref 15–41)
Albumin: 3.2 g/dL — ABNORMAL LOW (ref 3.5–5.0)
Alkaline Phosphatase: 69 U/L (ref 38–126)
Anion gap: 8 (ref 5–15)
BILIRUBIN TOTAL: 0.6 mg/dL (ref 0.3–1.2)
BUN: 8 mg/dL (ref 6–20)
CHLORIDE: 110 mmol/L (ref 101–111)
CO2: 26 mmol/L (ref 22–32)
CREATININE: 1.06 mg/dL — AB (ref 0.44–1.00)
Calcium: 9.4 mg/dL (ref 8.9–10.3)
GFR, EST AFRICAN AMERICAN: 55 mL/min — AB (ref >60)
GFR, EST NON AFRICAN AMERICAN: 48 mL/min — AB (ref >60)
Glucose, Bld: 77 mg/dL (ref 65–99)
POTASSIUM: 3.8 mmol/L (ref 3.5–5.1)
Sodium: 144 mmol/L (ref 135–145)
TOTAL PROTEIN: 6.8 g/dL (ref 6.5–8.1)

## 2015-07-27 LAB — TSH: TSH: 2.066 u[IU]/mL (ref 0.350–4.500)

## 2015-07-27 NOTE — Progress Notes (Signed)
Patient ID: [name] [name], female   DOB: July 01, 1931, 82 y.o.   MRN: [id number]     Primary Care Physician: [name], MD Referring Physician: Dr. Idell Pickles [name] [name] is a 82 y.o. female with a h/o afib on amiodarone and denies any sensation of irregular heart beat. She did have a brain bleed last September without residual neuro deficits and was switched from warfarin to apixaban . Has done well since. She has been having some ankle swelling and her amlodipine was recently stopped, nadolol was also stopped and labetalol was started, but without noticeable improvement.  She does elevate her legs and avoids salt. EF is normal. She did miss her remote check last September and will get this reset for her.  Today, she denies symptoms of palpitations, chest pain, shortness of breath, orthopnea, PND, lower extremity edema, dizziness, presyncope, syncope, or neurologic sequela. The patient is tolerating medications without difficulties and is otherwise without complaint today.   Past Medical History  Diagnosis Date  . Persistent atrial fibrillation ([place])   . sinus node dysfunction//post termination pauses   . HTN (hypertension)   . CAD (coronary artery disease)     LHC 5/08:  pOM 20%, pRCA 20-30%, EF 60%  . NASH (nonalcoholic steatohepatitis)   . PMR (polymyalgia rheumatica) (HCC)     no steriods for 5 years  . HLD (hyperlipidemia)   . Neuromuscular disorder ([place])   . Hepatitis     hx of medication induced hepatitis (Vytorin per pt)  . Ascending aorta enlargement (HCC)     4.8 cm per echo 08/07/11; 5.0 by CT in Jan 2013  . Pacemaker-Medtronic 11/12/2011    Implanted 2013   . Hx of echocardiogram     echo 1/13: EF 55%, Gr 2 diast dysfn, Asc Ao aneurysm 4.8 cm, mild MR, mod LAE, mild RAE  . ICH (intracerebral hemorrhage) ([place]) 03/24/15  . Stroke [facility])    Past Surgical History  Procedure Laterality Date  . Umbilical hernia repair  1950's  . Incision and drainage breast abscess  1973  .  Cardiac catheterization    . Hernia repair    . Pacemaker insertion  Jan 2013  . Cardioversion  03/02/2012    Procedure: CARDIOVERSION;  Surgeon: Lelon Perla, MD;  Location: [place];  Service: Cardiovascular;  Laterality: N/A;  . Cardioversion  03/13/2012    Procedure: CARDIOVERSION;  Surgeon: Josue Hector, MD;  Location: [facility] ENDOSCOPY;  Service: Cardiovascular;  Laterality: N/A;  . Cardioversion  04/15/2012    Procedure: CARDIOVERSION;  Surgeon: [name], MD;  Location: [facility] ENDOSCOPY;  Service: Cardiovascular;  Laterality: N/A;  . Cardioversion N/A 06/07/2014    Procedure: CARDIOVERSION;  Surgeon: [name], MD;  Location: [place];  Service: Cardiovascular;  Laterality: N/A;  . Permanent pacemaker insertion N/A 07/29/2011    Procedure: PERMANENT PACEMAKER INSERTION;  Surgeon: [name], MD;  Location: [facility] CATH LAB;  Service: Cardiovascular;  Laterality: N/A;  . Cardioversion N/A 08/18/2014    Procedure: CARDIOVERSION;  Surgeon: [name], MD;  Location: [facility] ENDOSCOPY;  Service: Cardiovascular;  Laterality: N/A;    Current Outpatient Prescriptions  Medication Sig Dispense Refill  . ALPRAZolam (XANAX) 0.25 MG tablet     . amiodarone (PACERONE) 200 MG tablet Take 1 tablet (200 mg total) by mouth daily. 30 tablet 6  . apixaban (ELIQUIS) 5 MG TABS tablet Take 1 tablet (5 mg total) by mouth 2 (two) times daily. 60 tablet 5  .  Calcium Carbonate-Vitamin D (CALTRATE 600+D PO) Take 1 tablet by mouth 2 (two) times daily.    . cetirizine (ZYRTEC) 10 MG tablet Take 10 mg by mouth daily.     . Cholecalciferol (VITAMIN D3) 1000 UNITS tablet Take 1,000 Units by mouth at bedtime.     . furosemide (LASIX) 20 MG tablet TAKE 1 TABLET BY MOUTH DAILY 30 tablet 3  . labetalol (NORMODYNE) 300 MG tablet Take 1 tablet (300 mg total) by mouth 2 (two) times daily. 60 tablet 6  . multivitamin (THERAGRAN) per tablet Take 1 tablet by mouth at bedtime.     . potassium chloride SA  (K-DUR,KLOR-CON) 20 MEQ tablet TAKE 2 TABLETS BY MOUTH ONCE DAILY 60 tablet 6  . traMADol (ULTRAM) 50 MG tablet Take 50 mg by mouth 2 (two) times daily as needed for moderate pain.    . valsartan (DIOVAN) 320 MG tablet TAKE 1 TABLET BY MOUTH DAILY 30 tablet 3  . WELCHOL 625 MG tablet Take 625-1,250 mg by mouth 2 (two) times daily. Take 625 mg every morning and 1250 mg at bedtime.     No current facility-administered medications for this encounter.    Allergies  Allergen Reactions  . Penicillins Anaphylaxis  . Statins Other (See Comments)    REACTION: elevated LFT's  . Tylenol [Acetaminophen] Other (See Comments)    If taken with Vytorin at risk for liver damage  . Tape Other (See Comments)    Redness, Please use "paper" tape only.  . Codeine Other (See Comments)    REACTION: feel funny    Social History   Social History  . Marital Status: Married    Spouse Name: N/A  . Number of Children: N/A  . Years of Education: N/A   Occupational History  . Not on file.   Social History Main Topics  . Smoking status: Former Smoker    Types: Cigarettes    Quit date: 07/16/1990  . Smokeless tobacco: Never Used  . Alcohol Use: No  . Drug Use: No  . Sexual Activity: No   Other Topics Concern  . Not on file   Social History Narrative   Pt lives in [place] with spouse.  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 Coronary artery disease    . Alzheimer's disease      ROS- All systems are reviewed and negative except as per the HPI above  Physical Exam: Filed Vitals:   07/27/15 1020  BP: 116/60  Pulse: 80  Height: 5' 6"  (1.676 m)  Weight: 178 lb 3.2 oz (80.831 kg)    GEN- The patient is well appearing, alert and oriented x 3 today.   Head- normocephalic, atraumatic Eyes-  Sclera clear, conjunctiva pink Ears- hearing intact Oropharynx- clear Neck- supple, no JVP Lymph- no cervical  lymphadenopathy Lungs- Clear to ausculation bilaterally, normal work of breathing Heart- Regular rate and rhythm, no murmurs, rubs or gallops, PMI not laterally displaced GI- soft, NT, ND, + BS Extremities- no clubbing, cyanosis, or edema MS- no significant deformity or atrophy Skin- no rash or lesion Psych- euthymic mood, full affect Neuro- strength and sensation are intact  EKG-atrial paced rhythm with prolonged AV conduction , NSIVB, pr int 288 ms, qrs duration 130 ms, qtc 498 ms Epic records reviewed  Assessment and Plan: 1. afib  Appears to be staying in SR on amiodarone Missed remote check 9/16 and needs rescheduled and will message  device clinic to this effect. Tsh/liver  2. Cerebral bleed 9/16 Resolved Off coumadin, on apixaban  3. Pedal edema Not improved with recent med change Avoid salt Elevate legs Compression knee high socks suggested Echo reviewed, with normal EF Possibly related to venous insufficiency  Lasix as ordered Weight is stable, no pnd, orthopnea bmet   F/u with Dr. Caryl Comes in March as on recall afib clinic as needed

## 2015-07-28 ENCOUNTER — Telehealth: Payer: Self-pay | Admitting: *Deleted

## 2015-07-28 NOTE — Telephone Encounter (Signed)
-----   Message from Juluis Mire, RN sent at 07/27/2015 10:53 AM EST ----- Regarding: missed remote transmission Pt missed her September remote (was hospitalized) and would like to have someone call her with rescheduling and instructions on how to send. Thanks [employer]

## 2015-07-28 NOTE — Telephone Encounter (Signed)
Called patient regarding sending a manual transmission.  She was scheduled in September but was hospitalized at the time and was unable to transmit.  Advised patient that she is due to follow-up with Dr. Caryl Comes in March and that she should be receiving a letter in the mail to schedule that appointment, but that she can send a transmission this week if she would like to.  Discussed instructions for using [employer] and gave tech services phone number if she has any issues.  Patient is appreciative of call and denies additional questions at this time.

## 2015-07-31 ENCOUNTER — Ambulatory Visit (INDEPENDENT_AMBULATORY_CARE_PROVIDER_SITE_OTHER): Payer: Medicare Other | Admitting: *Deleted

## 2015-07-31 DIAGNOSIS — I495 Sick sinus syndrome: Secondary | ICD-10-CM | POA: Diagnosis not present

## 2015-08-02 NOTE — Progress Notes (Signed)
Remote pacemaker transmission.   

## 2015-08-04 ENCOUNTER — Encounter: Payer: Self-pay | Admitting: Cardiology

## 2015-08-04 LAB — CUP PACEART REMOTE DEVICE CHECK
Battery Impedance: 235 Ohm
Battery Voltage: 2.78 V
Implantable Lead Implant Date: 20130114
Implantable Lead Location: 753859
Implantable Lead Location: 753860
Implantable Lead Model: 1948
Implantable Lead Model: 5076
Lead Channel Impedance Value: 411 Ohm
Lead Channel Impedance Value: 719 Ohm
MDC IDC LEAD IMPLANT DT: 20130114
MDC IDC MSMT BATTERY REMAINING LONGEVITY: 111 mo
MDC IDC SESS DTM: 20170115232637
MDC IDC SET LEADCHNL RA PACING AMPLITUDE: 2 V
MDC IDC SET LEADCHNL RV PACING AMPLITUDE: 2.5 V
MDC IDC SET LEADCHNL RV PACING PULSEWIDTH: 0.4 ms
MDC IDC SET LEADCHNL RV SENSING SENSITIVITY: 5.6 mV
MDC IDC STAT BRADY AP VP PERCENT: 1 %
MDC IDC STAT BRADY AP VS PERCENT: 98 %
MDC IDC STAT BRADY AS VP PERCENT: 0 %
MDC IDC STAT BRADY AS VS PERCENT: 0 %

## 2015-08-07 ENCOUNTER — Ambulatory Visit: Payer: Self-pay | Admitting: Internal Medicine

## 2015-08-07 DIAGNOSIS — I480 Paroxysmal atrial fibrillation: Secondary | ICD-10-CM

## 2015-08-07 DIAGNOSIS — Z5181 Encounter for therapeutic drug level monitoring: Secondary | ICD-10-CM

## 2015-08-18 ENCOUNTER — Encounter: Payer: Self-pay | Admitting: Cardiology

## 2015-08-22 ENCOUNTER — Other Ambulatory Visit: Payer: Self-pay | Admitting: Internal Medicine

## 2015-09-01 ENCOUNTER — Telehealth: Payer: Self-pay | Admitting: Internal Medicine

## 2015-09-01 NOTE — Telephone Encounter (Signed)
New message     Pt has been in afib since 5pm yesterday. She says she usually do not convert back on her own.  Please call

## 2015-09-01 NOTE — Telephone Encounter (Signed)
Called [name] [name] at A fib clinic (Established patient) Roderic Palau said if the patients heart rate is fast they will see her today; if it is not fast have patient increase her amiodarone 200 mg  to twice a day and she will see her on Moonday Patients heart rate is 92 this morning. BP 130/70.  No shortness of breath or chest discomfort Patient instructed to increase amiodarone 200 mg twice a day until see by Roderic Palau on Monday That she has an appointment at [place] on Monday at the Afib clinic with Roderic Palau. Given gate code (0001)   Routed to Roderic Palau NP [facility] Routed to Virl Axe MD Routed to Dorris Carnes MD DOD

## 2015-09-04 ENCOUNTER — Ambulatory Visit (HOSPITAL_COMMUNITY)
Admission: RE | Admit: 2015-09-04 | Discharge: 2015-09-04 | Disposition: A | Discharge status: Home / Self Care | Payer: Medicare Other | Source: Ambulatory Visit | Attending: Nurse Practitioner | Admitting: Nurse Practitioner

## 2015-09-04 ENCOUNTER — Ambulatory Visit: Payer: Medicare Other | Admitting: Neurology

## 2015-09-04 ENCOUNTER — Encounter (HOSPITAL_COMMUNITY): Payer: Self-pay | Admitting: Nurse Practitioner

## 2015-09-04 ENCOUNTER — Telehealth (HOSPITAL_COMMUNITY): Payer: Self-pay | Admitting: *Deleted

## 2015-09-04 ENCOUNTER — Other Ambulatory Visit (HOSPITAL_COMMUNITY): Payer: Self-pay | Admitting: *Deleted

## 2015-09-04 VITALS — BP 112/70 | HR 77 | Ht 66.0 in | Wt 170.6 lb

## 2015-09-04 DIAGNOSIS — M353 Polymyalgia rheumatica: Secondary | ICD-10-CM | POA: Insufficient documentation

## 2015-09-04 DIAGNOSIS — E785 Hyperlipidemia, unspecified: Secondary | ICD-10-CM | POA: Insufficient documentation

## 2015-09-04 DIAGNOSIS — Z880 Allergy status to penicillin: Secondary | ICD-10-CM | POA: Insufficient documentation

## 2015-09-04 DIAGNOSIS — Z87891 Personal history of nicotine dependence: Secondary | ICD-10-CM | POA: Diagnosis not present

## 2015-09-04 DIAGNOSIS — Z7902 Long term (current) use of antithrombotics/antiplatelets: Secondary | ICD-10-CM | POA: Diagnosis not present

## 2015-09-04 DIAGNOSIS — I4891 Unspecified atrial fibrillation: Secondary | ICD-10-CM | POA: Diagnosis present

## 2015-09-04 DIAGNOSIS — I4819 Other persistent atrial fibrillation: Secondary | ICD-10-CM

## 2015-09-04 DIAGNOSIS — Z8673 Personal history of transient ischemic attack (TIA), and cerebral infarction without residual deficits: Secondary | ICD-10-CM | POA: Insufficient documentation

## 2015-09-04 DIAGNOSIS — Z950 Presence of cardiac pacemaker: Secondary | ICD-10-CM | POA: Insufficient documentation

## 2015-09-04 DIAGNOSIS — Z8249 Family history of ischemic heart disease and other diseases of the circulatory system: Secondary | ICD-10-CM | POA: Diagnosis not present

## 2015-09-04 DIAGNOSIS — Z885 Allergy status to narcotic agent status: Secondary | ICD-10-CM | POA: Insufficient documentation

## 2015-09-04 DIAGNOSIS — I481 Persistent atrial fibrillation: Secondary | ICD-10-CM | POA: Diagnosis not present

## 2015-09-04 DIAGNOSIS — Z79899 Other long term (current) drug therapy: Secondary | ICD-10-CM | POA: Diagnosis not present

## 2015-09-04 DIAGNOSIS — I10 Essential (primary) hypertension: Secondary | ICD-10-CM | POA: Diagnosis not present

## 2015-09-04 DIAGNOSIS — I2510 Atherosclerotic heart disease of native coronary artery without angina pectoris: Secondary | ICD-10-CM | POA: Insufficient documentation

## 2015-09-04 LAB — CBC
HCT: 36.7 % (ref 36.0–46.0)
Hemoglobin: 12 g/dL (ref 12.0–15.0)
MCH: 31.4 pg (ref 26.0–34.0)
MCHC: 32.7 g/dL (ref 30.0–36.0)
MCV: 96.1 fL (ref 78.0–100.0)
PLATELETS: 215 10*3/uL (ref 150–400)
RBC: 3.82 MIL/uL — ABNORMAL LOW (ref 3.87–5.11)
RDW: 14.3 % (ref 11.5–15.5)
WBC: 4.7 10*3/uL (ref 4.0–10.5)

## 2015-09-04 LAB — MAGNESIUM: MAGNESIUM: 1.9 mg/dL (ref 1.7–2.4)

## 2015-09-04 LAB — BASIC METABOLIC PANEL
ANION GAP: 9 (ref 5–15)
BUN: 18 mg/dL (ref 6–20)
CHLORIDE: 108 mmol/L (ref 101–111)
CO2: 25 mmol/L (ref 22–32)
Calcium: 9.8 mg/dL (ref 8.9–10.3)
Creatinine, Ser: 1.03 mg/dL — ABNORMAL HIGH (ref 0.44–1.00)
GFR calc Af Amer: 57 mL/min — ABNORMAL LOW (ref >60)
GFR calc non Af Amer: 49 mL/min — ABNORMAL LOW (ref >60)
GLUCOSE: 117 mg/dL — AB (ref 65–99)
POTASSIUM: 4.2 mmol/L (ref 3.5–5.1)
Sodium: 142 mmol/L (ref 135–145)

## 2015-09-04 NOTE — Progress Notes (Signed)
Patient ID: [name], female   DOB: 06-Jan-1931, 82 y.o.   MRN: [id number]     Primary Care Physician: [name] Gravel, MD Referring Physician: Dr. Idell Pickles [name] [name] is a 82 y.o. female with a h/o PPM, CAD, s/p 9/16 left posterior parietal ICH, likely due to coumadin use, persistent afib on amiodarone. She called the afib office last Friday, stating that she had been in afib since the day before. She  was told to increase amiodarone to 200 mg bid until seen today. Today in the office she thinks like  she feels better, but EKG shows v paced rhythm. Interrogation of device confirms afib since Friday. Average v rates in 70's, 80's and max 120's. She states that she usually will require DCCV to return to [place]. Has not missed any eliquis. She was switched to eliquis last September after her cerebral bleed.  Today, she denies symptoms of palpitations, chest pain, shortness of breath, orthopnea, PND, lower extremity edema, dizziness, presyncope, syncope, or neurologic sequela. The patient is tolerating medications without difficulties and is otherwise without complaint today.   Past Medical History  Diagnosis Date  . Persistent atrial fibrillation ([place])   . sinus node dysfunction//post termination pauses   . HTN (hypertension)   . CAD (coronary artery disease)     LHC 5/08:  pOM 20%, pRCA 20-30%, EF 60%  . NASH (nonalcoholic steatohepatitis)   . PMR (polymyalgia rheumatica) (HCC)     no steriods for 5 years  . HLD (hyperlipidemia)   . Neuromuscular disorder ([place])   . Hepatitis     hx of medication induced hepatitis (Vytorin per pt)  . Ascending aorta enlargement (HCC)     4.8 cm per echo 08/07/11; 5.0 by CT in Jan 2013  . Pacemaker-Medtronic 11/12/2011    Implanted 2013   . Hx of echocardiogram     echo 1/13: EF 55%, Gr 2 diast dysfn, Asc Ao aneurysm 4.8 cm, mild MR, mod LAE, mild RAE  . ICH (intracerebral hemorrhage) ([place]) 03/24/15  . Stroke [facility])    Past Surgical History  Procedure  Laterality Date  . Umbilical hernia repair  1950's  . Incision and drainage breast abscess  1973  . Cardiac catheterization    . Hernia repair    . Pacemaker insertion  Jan 2013  . Cardioversion  03/02/2012    Procedure: CARDIOVERSION;  Surgeon: Lelon Perla, MD;  Location: [place];  Service: Cardiovascular;  Laterality: N/A;  . Cardioversion  03/13/2012    Procedure: CARDIOVERSION;  Surgeon: Josue Hector, MD;  Location: [facility] ENDOSCOPY;  Service: Cardiovascular;  Laterality: N/A;  . Cardioversion  04/15/2012    Procedure: CARDIOVERSION;  Surgeon: [name], MD;  Location: [facility] ENDOSCOPY;  Service: Cardiovascular;  Laterality: N/A;  . Cardioversion N/A 06/07/2014    Procedure: CARDIOVERSION;  Surgeon: [name], MD;  Location: [place];  Service: Cardiovascular;  Laterality: N/A;  . Permanent pacemaker insertion N/A 07/29/2011    Procedure: PERMANENT PACEMAKER INSERTION;  Surgeon: [name], MD;  Location: [facility] CATH LAB;  Service: Cardiovascular;  Laterality: N/A;  . Cardioversion N/A 08/18/2014    Procedure: CARDIOVERSION;  Surgeon: [name], MD;  Location: [facility] ENDOSCOPY;  Service: Cardiovascular;  Laterality: N/A;    Current Outpatient Prescriptions  Medication Sig Dispense Refill  . ALPRAZolam (XANAX) 0.25 MG tablet     . amiodarone (PACERONE) 200 MG tablet Take 1 tablet (200 mg total) by mouth daily. 30 tablet 6  .  apixaban (ELIQUIS) 5 MG TABS tablet Take 1 tablet (5 mg total) by mouth 2 (two) times daily. 60 tablet 5  . Calcium Carbonate-Vitamin D (CALTRATE 600+D PO) Take 1 tablet by mouth 2 (two) times daily.    . cetirizine (ZYRTEC) 10 MG tablet Take 10 mg by mouth daily.     . Cholecalciferol (VITAMIN D3) 1000 UNITS tablet Take 1,000 Units by mouth at bedtime.     . furosemide (LASIX) 20 MG tablet TAKE 1 TABLET BY MOUTH DAILY 30 tablet 3  . labetalol (NORMODYNE) 300 MG tablet Take 1 tablet (300 mg total) by mouth 2 (two) times daily. 60 tablet 6  .  multivitamin (THERAGRAN) per tablet Take 1 tablet by mouth at bedtime.     . potassium chloride SA (K-DUR,KLOR-CON) 20 MEQ tablet TAKE 2 TABLETS BY MOUTH ONCE DAILY 60 tablet 6  . traMADol (ULTRAM) 50 MG tablet Take 50 mg by mouth 2 (two) times daily as needed for moderate pain.    . valsartan (DIOVAN) 320 MG tablet TAKE 1 TABLET BY MOUTH DAILY 30 tablet 1  . WELCHOL 625 MG tablet Take 625-1,250 mg by mouth 2 (two) times daily. Take 625 mg every morning and 1250 mg at bedtime.     No current facility-administered medications for this encounter.    Allergies  Allergen Reactions  . Penicillins Anaphylaxis  . Statins Other (See Comments)    REACTION: elevated LFT's  . Tylenol [Acetaminophen] Other (See Comments)    If taken with Vytorin at risk for liver damage  . Tape Other (See Comments)    Redness, Please use "paper" tape only.  . Codeine Other (See Comments)    REACTION: feel funny    Social History   Social History  . Marital Status: Married    Spouse Name: N/A  . Number of Children: N/A  . Years of Education: N/A   Occupational History  . Not on file.   Social History Main Topics  . Smoking status: Former Smoker    Types: Cigarettes    Quit date: 07/16/1990  . Smokeless tobacco: Never Used  . Alcohol Use: No  . Drug Use: No  . Sexual Activity: No   Other Topics Concern  . Not on file   Social History Narrative   Pt lives in [place] with spouse.  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 Coronary artery disease    . Alzheimer's disease      ROS- All systems are reviewed and negative except as per the HPI above  Physical Exam: Filed Vitals:   09/04/15 1035  BP: 112/70  Pulse: 77  Height: 5' 6"  (1.676 m)  Weight: 170 lb 9.6 oz (77.384 kg)    GEN- The patient is well appearing, alert and oriented x 3 today.   Head- normocephalic, atraumatic Eyes-  Sclera clear, conjunctiva  pink Ears- hearing intact Oropharynx- clear Neck- supple, no JVP Lymph- no cervical lymphadenopathy Lungs- Clear to ausculation bilaterally, normal work of breathing Heart- Regular(v paced) rate and rhythm, no murmurs, rubs or gallops, PMI not laterally displaced GI- soft, NT, ND, + BS Extremities- no clubbing, cyanosis, or edema MS- no significant deformity or atrophy Skin- no rash or lesion Psych- euthymic mood, full affect Neuro- strength and sensation are intact  EKG-Ventricular paced rhythm at 77 bpm Interrogated and shows underlying afib since last Thursday  2/16  Assessment and Plan: 1. Afib  Persistent  x 5 days despite increasing amiodarone to 200 mg bid Will plan on cardioversion  Continue labetelol Will continue  amiodarone at 200 mg bid until cardioversion and decrease back to 200 mg daily Do not miss any doses of eliquis which pt tells me that she has not missed any doses in the past  Cbc, bmet mag today  2. PPM Interogated today and with normal functioning device Persistent afib  F/u afib clinic one week post cardioversion  Butch Penny C. Elnathan Fulford, [facility] [address] [place], [place] [address] [phone]

## 2015-09-04 NOTE — Telephone Encounter (Signed)
Patient notified of [place] scheduling -- Wednesday February 22nd arrive at [facility] entrance at [place] for dccv at 11am with Dr. Marlou Porch.  Patient instructed NPO after midnight except sip of water with medications, ride home required.  Also instructed to decrease Amiodarone back to 261m once a day after the cardioversion. Patient verbalized understanding and all questions answered.

## 2015-09-04 NOTE — Patient Instructions (Signed)
Your physician has recommended that you have a pulmonary function test. Pulmonary Function Tests are a group of tests that measure how well air moves in and out of your lungs.  Arrive at [facility] entrance and check in with admitting at 8:45am No smoking, caffeine or inhalers 4 hours prior to test.   Butch Penny will be in touch after speaking with Dr. Caryl Comes

## 2015-09-05 ENCOUNTER — Encounter: Payer: Self-pay | Admitting: Neurology

## 2015-09-05 ENCOUNTER — Ambulatory Visit (INDEPENDENT_AMBULATORY_CARE_PROVIDER_SITE_OTHER): Payer: Medicare Other | Admitting: Neurology

## 2015-09-05 VITALS — BP 95/62 | HR 72 | Ht 66.0 in | Wt 171.0 lb

## 2015-09-05 DIAGNOSIS — I480 Paroxysmal atrial fibrillation: Secondary | ICD-10-CM

## 2015-09-05 DIAGNOSIS — Z950 Presence of cardiac pacemaker: Secondary | ICD-10-CM | POA: Diagnosis not present

## 2015-09-05 DIAGNOSIS — Z7901 Long term (current) use of anticoagulants: Secondary | ICD-10-CM | POA: Diagnosis not present

## 2015-09-05 DIAGNOSIS — I611 Nontraumatic intracerebral hemorrhage in hemisphere, cortical: Secondary | ICD-10-CM

## 2015-09-05 NOTE — Patient Instructions (Addendum)
-   continue eliquis 59m twice a day for stroke prevention - follow up with cardiology for afib management - check BP at home and record - Follow up with your primary care physician for stroke risk factor modification. Recommend maintain blood pressure goal <130/80, diabetes with hemoglobin A1c goal below 6.5% and lipids with LDL cholesterol goal below 70 mg/dL.  - follow up in 6 months.

## 2015-09-05 NOTE — Progress Notes (Signed)
STROKE NEUROLOGY FOLLOW UP NOTE  NAME: [name] [name] DOB: 1931-03-03  REASON FOR VISIT: stroke follow up HISTORY FROM: pt and chart  Today we had the pleasure of seeing [name] [name] in follow-up at our Neurology Clinic. Pt was accompanied by husband.   History Summary [name] [name] is a 82 y.o. Female with hx of a-fib on coumadin s/p pacemaker, HTN, CAD, HLD admitted on 03/25/15 for L parietal ICH. INR 2.35, reversed with Kcentra. INR down to 1.13. Symptoms resolved and repeat CT stable. Her BP was low while in hospital and her BP meds has to be hold initially and put back gradually as BP getting up. Still holding off hydralazine on discharge. Plan to repeat CT in one month and restart AC. Pt is discharged in good condition.  Follow up 05/02/15 - the patient has been doing well. However, [name] [name] went to ER on 04/01/15 for elevated BP and got CT head showed evolving ICH. [name] [name] went to ER again on 04/06/15 for frontal HA, got CT head again for further evolving ICH. [name] [name] follows with Dr. Caryl Comes in clinic for afib, on amiodarone and nadolol. Currently BP 120-130 at home. Today BP 114/68. [name] [name] has thoracic aorta aneurysm, in 07/2011 was 4.8cm and in 03/2015 it showed 4.4 cm.   Interval History During the interval time, pt doing well from stroke standpoint. No recurrent symptoms. No bleeding side effects on eliquis. [name] [name] still has intermittent afib symptomatic. This time again since Thursday. Will have appointment with cardiology to do cardioversion tomorrow. BP today in clinic 95/62. However pt checks her BP at home always 120-130s.  REVIEW OF SYSTEMS: Full 14 system review of systems performed and notable only for those listed below and in HPI above, all others are negative:  Constitutional:   Cardiovascular:  Ear/Nose/Throat:  Ringing in ears Skin:  Eyes:   Respiratory:   Gastroitestinal:   Genitourinary:  Hematology/Lymphatic:  Easy bruising Endocrine:  Musculoskeletal:  Walking  difficulty Allergy/Immunology:   Neurological:  Psychiatric:  Sleep:   The following represents the patient's updated allergies and side effects list: Allergies  Allergen Reactions  . Penicillins Anaphylaxis  . Statins Other (See Comments)    REACTION: elevated LFT's  . Tylenol [Acetaminophen] Other (See Comments)    If taken with Vytorin at risk for liver damage  . Tape Other (See Comments)    Redness, Please use "paper" tape only.  . Codeine Other (See Comments)    REACTION: feel funny    The neurologically relevant items on the patient's problem list were reviewed on today's visit.  Neurologic Examination  A problem focused neurological exam (12 or more points of the single system neurologic examination, vital signs counts as 1 point, cranial nerves count for 8 points) was performed.  Blood pressure 95/62, pulse 72, height 5' 6"  (1.676 m), weight 171 lb (77.565 kg).  General - Well nourished, well developed, in no apparent distress.  Ophthalmologic - Sharp disc margins OU.   Cardiovascular - irregularly irregular heart rate and rhythm.  Mental Status -  Level of arousal and orientation to time, place, and person were intact. Language including expression, naming, repetition, comprehension was assessed and found intact. Fund of Knowledge was assessed and was intact.  Cranial Nerves II - XII - II - Visual field intact OU. III, IV, VI - Extraocular movements intact. V - Facial sensation intact bilaterally. VII - Facial movement intact bilaterally. VIII - Hearing & vestibular intact bilaterally. X - Palate elevates  symmetrically. XI - Chin turning & shoulder shrug intact bilaterally. XII - Tongue protrusion intact.  Motor Strength - The patient's strength was normal in all extremities and pronator drift was absent.  Bulk was normal and fasciculations were absent.   Motor Tone - Muscle tone was assessed at the neck and appendages and was normal.  Reflexes - The  patient's reflexes were 1+ in all extremities and [name] [name] had no pathological reflexes.  Sensory - Light touch, temperature/pinprick were assessed and were normal.    Coordination - The patient had normal movements in the hands and feet with no ataxia or dysmetria.  Tremor was absent.  Gait and Station - difficulty getting up from chair, slow and cautious gait, but steady.  Data reviewed: I personally reviewed the images and agree with the radiology interpretations.  Ct Head Wo Contrast  03/26/2015 IMPRESSION: Slight interval decrease in size of left occipital intraparenchymal hemorrhage. No new acute intracranial finding.   03/26/2015 IMPRESSION: Stable LEFT parietal parasagittal intraparenchymal hematoma as described. No significant increase in hematoma or edema compared with yesterday's CT.   03/25/2015 IMPRESSION: Acute left posterior parietal intraparenchymal hemorrhage is noted.   04/01/15 - Slight increase in the size of left posterior parietal hematoma with development of 6 mm satellite hemorrhage as well, new from most recent prior study.  04/06/15 - Continued presence of intraparenchymal hemorrhage in left posterior parietal cortex with stable amount of surrounding white matter edema. Stable small satellite hemorrhage is noted more superiorly toward the vertex. No significant midline shift is noted.  2D echo - - Compared to a prior echo in 2013, there are few changes. Pacemaker wires are now noted. There is no longer a pericadrial effusion. The ascending aorta is dilated to 4.4 cm at the most distally visualized point. Consider dedicated imaging of the aorta wtih CT. There is moderate TR with RVSP of 42 mmHg, the RA pressure is estimated at 8 mmHg.  Component     Latest Ref Rng 03/27/2015  Cholesterol     0 - 200 mg/dL 181  Triglycerides     <150 mg/dL 96  HDL Cholesterol     >40 mg/dL 64  Total CHOL/HDL Ratio      2.8  VLDL     0 - 40 mg/dL 19   LDL (calc)     0 - 99 mg/dL 98  Hemoglobin A1C     4.8 - 5.6 % 5.5  Mean Plasma Glucose      111    Assessment: As you may recall, [name] [name] is a 82 y.o. Caucasian female with PMH of a-fib on coumadin s/p pacemaker, HTN, CAD, HLD admitted on 03/25/15 for L parietal ICH. INR 2.35, reversed with Kcentra. INR down to 1.13. Symptoms resolved and repeat CT stable. AC on hold. Pt is discharged in good condition. During the interval time, [name] [name] had two head CT showing evolving hematoma. BP well controlled with meds. No neuro deficit residue. [name] [name] follows with Dr. Caryl Comes in clinic for afib, on amiodarone and nadolol. [name] [name] has thoracic aorta aneurysm, in 07/2011 was 4.8cm and in 03/2015 it showed 4.4 cm. Restarted on eliquis 20m bid and tolerating well. Still has paroxysmal afib symptomatic and will have cardioversion with cardiology. Bp in clinic was low but at home normal and pt is asymptomatic.   Plan:  - continue eliquis 519mtwice a day for stroke prevention - follow up with cardiology for afib management - check BP at home and record - Follow up with  your primary care physician for stroke risk factor modification. Recommend maintain blood pressure goal <130/80, diabetes with hemoglobin A1c goal below 6.5% and lipids with LDL cholesterol goal below 70 mg/dL.  - follow up in 6 months.  I spent more than 25 minutes of face to face time with the patient. Greater than 50% of time was spent in counseling and coordination of care.    No orders of the defined types were placed in this encounter.    No orders of the defined types were placed in this encounter.    Patient Instructions  - continue eliquis 9m twice a day for stroke prevention - follow up with cardiology for afib management - check BP at home and record - Follow up with your primary care physician for stroke risk factor modification. Recommend maintain blood pressure goal <130/80, diabetes with hemoglobin A1c goal below 6.5% and lipids with LDL  cholesterol goal below 70 mg/dL.  - follow up in 6 months.    JRosalin Hawking MD PhD [facility] [address] [place] [phone]

## 2015-09-06 ENCOUNTER — Ambulatory Visit (HOSPITAL_COMMUNITY)
Admission: RE | Admit: 2015-09-06 | Discharge: 2015-09-06 | Disposition: A | Discharge status: Home / Self Care | Payer: Medicare Other | Source: Ambulatory Visit | Attending: Cardiology | Admitting: Cardiology

## 2015-09-06 ENCOUNTER — Encounter (HOSPITAL_COMMUNITY): Payer: Self-pay | Admitting: Anesthesiology

## 2015-09-06 ENCOUNTER — Ambulatory Visit (HOSPITAL_COMMUNITY): Payer: Medicare Other | Admitting: Anesthesiology

## 2015-09-06 ENCOUNTER — Encounter (HOSPITAL_COMMUNITY)
Admission: RE | Disposition: A | Discharge status: Home / Self Care | Payer: Self-pay | Source: Ambulatory Visit | Attending: Cardiology

## 2015-09-06 ENCOUNTER — Other Ambulatory Visit: Payer: Self-pay | Admitting: Internal Medicine

## 2015-09-06 DIAGNOSIS — Z7901 Long term (current) use of anticoagulants: Secondary | ICD-10-CM | POA: Diagnosis not present

## 2015-09-06 DIAGNOSIS — I10 Essential (primary) hypertension: Secondary | ICD-10-CM | POA: Insufficient documentation

## 2015-09-06 DIAGNOSIS — Z950 Presence of cardiac pacemaker: Secondary | ICD-10-CM | POA: Diagnosis not present

## 2015-09-06 DIAGNOSIS — J449 Chronic obstructive pulmonary disease, unspecified: Secondary | ICD-10-CM | POA: Insufficient documentation

## 2015-09-06 DIAGNOSIS — Z8673 Personal history of transient ischemic attack (TIA), and cerebral infarction without residual deficits: Secondary | ICD-10-CM | POA: Diagnosis not present

## 2015-09-06 DIAGNOSIS — E785 Hyperlipidemia, unspecified: Secondary | ICD-10-CM | POA: Diagnosis not present

## 2015-09-06 DIAGNOSIS — I2510 Atherosclerotic heart disease of native coronary artery without angina pectoris: Secondary | ICD-10-CM | POA: Diagnosis not present

## 2015-09-06 DIAGNOSIS — I4892 Unspecified atrial flutter: Secondary | ICD-10-CM | POA: Diagnosis not present

## 2015-09-06 DIAGNOSIS — I4891 Unspecified atrial fibrillation: Secondary | ICD-10-CM | POA: Diagnosis present

## 2015-09-06 DIAGNOSIS — I712 Thoracic aortic aneurysm, without rupture: Secondary | ICD-10-CM | POA: Insufficient documentation

## 2015-09-06 DIAGNOSIS — I739 Peripheral vascular disease, unspecified: Secondary | ICD-10-CM | POA: Insufficient documentation

## 2015-09-06 DIAGNOSIS — Z79899 Other long term (current) drug therapy: Secondary | ICD-10-CM | POA: Insufficient documentation

## 2015-09-06 DIAGNOSIS — I481 Persistent atrial fibrillation: Secondary | ICD-10-CM | POA: Insufficient documentation

## 2015-09-06 DIAGNOSIS — M353 Polymyalgia rheumatica: Secondary | ICD-10-CM | POA: Insufficient documentation

## 2015-09-06 DIAGNOSIS — Z87891 Personal history of nicotine dependence: Secondary | ICD-10-CM | POA: Diagnosis not present

## 2015-09-06 HISTORY — PX: CARDIOVERSION: SHX1299

## 2015-09-06 SURGERY — CARDIOVERSION
Anesthesia: General

## 2015-09-06 MED ORDER — SODIUM CHLORIDE 0.9 % IV SOLN
INTRAVENOUS | Status: DC | PRN
  Administered 2015-09-06: 11:00:00 via INTRAVENOUS

## 2015-09-06 MED ORDER — LIDOCAINE HCL (CARDIAC) 20 MG/ML IV SOLN
INTRAVENOUS | Status: DC | PRN
  Administered 2015-09-06: 60 mg via INTRATRACHEAL

## 2015-09-06 MED ORDER — PROPOFOL 10 MG/ML IV BOLUS
INTRAVENOUS | Status: DC | PRN
  Administered 2015-09-06: 60 mg via INTRAVENOUS

## 2015-09-06 NOTE — Transfer of Care (Signed)
Immediate Anesthesia Transfer of Care Note  Patient: [name] [name]  Procedure(s) Performed: Procedure(s): CARDIOVERSION (N/A)  Patient Location: Endoscopy Unit  Anesthesia Type:MAC  Level of Consciousness: awake, oriented and patient cooperative  Airway & Oxygen Therapy: Patient Spontanous Breathing and Patient connected to nasal cannula oxygen  Post-op Assessment: Report given to RN and Post -op Vital signs reviewed and stable  Post vital signs: Reviewed  Last Vitals:  Filed Vitals:   09/06/15 1013 09/06/15 1057  BP: 137/62 115/38  Pulse: 72 62  Temp: 36.9 C 36.4 C  Resp: 14 14    Complications: No apparent anesthesia complications

## 2015-09-06 NOTE — CV Procedure (Signed)
    Electrical Cardioversion Procedure Note [name] [name] [id number] [birth year]  Procedure: Electrical Cardioversion Indications:  Atrial Flutter  Time Out: Verified patient identification, verified procedure,medications/allergies/relevent history reviewed, required imaging and test results available.  Performed  Procedure Details  The patient was NPO after midnight. Anesthesia was administered at the beside  by Dr. Rodman Comp with propofol.  Cardioversion was performed with synchronized biphasic defibrillation via AP pads with 75 joules.  1 attempt(s) were performed.  The patient converted to normal sinus rhythm. The patient tolerated the procedure well   IMPRESSION:  Successful cardioversion of atrial fibrillation Pacemaker Medtrontic functioning normally.     [name], [place] 09/06/2015, 10:55 AM

## 2015-09-06 NOTE — Interval H&P Note (Signed)
History and Physical Interval Note:  09/06/2015 10:04 AM  [name] [name]  has presented today for surgery, with the diagnosis of A FIB  The various methods of treatment have been discussed with the patient and family. After consideration of risks, benefits and other options for treatment, the patient has consented to  Procedure(s): CARDIOVERSION (N/A) as a surgical intervention .  The patient's history has been reviewed, patient examined, no change in status, stable for surgery.  I have reviewed the patient's chart and labs.  Questions were answered to the patient's satisfaction.     Joury Allcorn

## 2015-09-06 NOTE — Anesthesia Preprocedure Evaluation (Addendum)
Anesthesia Evaluation  Patient identified by MRN, date of birth, ID band Patient awake    Reviewed: Allergy & Precautions, NPO status , Patient's Chart, lab work & pertinent test results, reviewed documented beta blocker date and time   History of Anesthesia Complications Negative for: history of anesthetic complications  Airway Mallampati: II  TM Distance: >3 FB Neck ROM: full    Dental  (+) Teeth Intact, Dental Advidsory Given   Pulmonary COPD,  COPD inhaler, former smoker,    breath sounds clear to auscultation       Cardiovascular hypertension, Pt. on medications and Pt. on home beta blockers + CAD (non-obstructive by cath '08) and + Peripheral Vascular Disease (asc aorta 4.8 cm per echo 08/07/11; 5.0 by CT in Jan 2013)  + dysrhythmias Atrial Fibrillation + pacemaker  Rhythm:Irregular Rate:Normal  '14 myoview: normal '16 ECHO: EF 55%, Gr 2 diast dysfn, 4.4cm ascending aortic aneurysm.   Neuro/Psych negative neurological ROS     GI/Hepatic negative GI ROS, (+) Hepatitis -, Unspecified  Endo/Other  Polymyalgia rheumatica  Renal/GU negative Renal ROS     Musculoskeletal   Abdominal   Peds  Hematology  (+) Blood dyscrasia (coumadin), ,   Anesthesia Other Findings   Reproductive/Obstetrics                            Anesthesia Physical  Anesthesia Plan  ASA: III  Anesthesia Plan: General   Post-op Pain Management:    Induction: Intravenous  Airway Management Planned: Mask and Natural Airway  Additional Equipment:   Intra-op Plan:   Post-operative Plan:   Informed Consent: I have reviewed the patients History and Physical, chart, labs and discussed the procedure including the risks, benefits and alternatives for the proposed anesthesia with the patient or authorized representative who has indicated his/her understanding and acceptance.   Dental Advisory Given  Plan Discussed  with: Anesthesiologist, CRNA and Surgeon  Anesthesia Plan Comments: (Plan routine monitors, GA for cardioversion)        Anesthesia Quick Evaluation

## 2015-09-06 NOTE — H&P (View-Only) (Signed)
Patient ID: [name] [name], female   DOB: 1931/03/26, 82 y.o.   MRN: [id number]     Primary Care Physician: [name], MD Referring Physician: Dr. Idell Pickles [name] [name] is a 83 y.o. female with a h/o PPM, CAD, s/p 9/16 left posterior parietal ICH, likely due to coumadin use, persistent afib on amiodarone. She called the afib office last Friday, stating that she had been in afib since the day before. She  was told to increase amiodarone to 200 mg bid until seen today. Today in the office she thinks like  she feels better, but EKG shows v paced rhythm. Interrogation of device confirms afib since Friday. Average v rates in 70's, 80's and max 120's. She states that she usually will require DCCV to return to [place]. Has not missed any eliquis. She was switched to eliquis last September after her cerebral bleed.  Today, she denies symptoms of palpitations, chest pain, shortness of breath, orthopnea, PND, lower extremity edema, dizziness, presyncope, syncope, or neurologic sequela. The patient is tolerating medications without difficulties and is otherwise without complaint today.   Past Medical History  Diagnosis Date  . Persistent atrial fibrillation ([place])   . sinus node dysfunction//post termination pauses   . HTN (hypertension)   . CAD (coronary artery disease)     LHC 5/08:  pOM 20%, pRCA 20-30%, EF 60%  . NASH (nonalcoholic steatohepatitis)   . PMR (polymyalgia rheumatica) ([facility])     no steriods for 5 years  . HLD (hyperlipidemia)   . Neuromuscular disorder ([place])   . Hepatitis     hx of medication induced hepatitis (Vytorin per pt)  . Ascending aorta enlargement ([facility])     4.8 cm per echo 08/07/11; 5.0 by CT in Jan 2013  . Pacemaker-Medtronic 11/12/2011    Implanted 2013   . Hx of echocardiogram     echo 1/13: EF 55%, Gr 2 diast dysfn, Asc Ao aneurysm 4.8 cm, mild MR, mod LAE, mild RAE  . ICH (intracerebral hemorrhage) ([place]) 03/24/15  . Stroke [facility])    Past Surgical History  Procedure  Laterality Date  . Umbilical hernia repair  1950's  . Incision and drainage breast abscess  1973  . Cardiac catheterization    . Hernia repair    . Pacemaker insertion  Jan 2013  . Cardioversion  03/02/2012    Procedure: CARDIOVERSION;  Surgeon: Lelon Perla, MD;  Location: [facility];  Service: Cardiovascular;  Laterality: N/A;  . Cardioversion  03/13/2012    Procedure: CARDIOVERSION;  Surgeon: Josue Hector, MD;  Location: [facility] ENDOSCOPY;  Service: Cardiovascular;  Laterality: N/A;  . Cardioversion  04/15/2012    Procedure: CARDIOVERSION;  Surgeon: [name], MD;  Location: [facility] ENDOSCOPY;  Service: Cardiovascular;  Laterality: N/A;  . Cardioversion N/A 06/07/2014    Procedure: CARDIOVERSION;  Surgeon: [name], MD;  Location: [facility];  Service: Cardiovascular;  Laterality: N/A;  . Permanent pacemaker insertion N/A 07/29/2011    Procedure: PERMANENT PACEMAKER INSERTION;  Surgeon: [name], MD;  Location: [facility] CATH LAB;  Service: Cardiovascular;  Laterality: N/A;  . Cardioversion N/A 08/18/2014    Procedure: CARDIOVERSION;  Surgeon: [name], MD;  Location: [facility] ENDOSCOPY;  Service: Cardiovascular;  Laterality: N/A;    Current Outpatient Prescriptions  Medication Sig Dispense Refill  . ALPRAZolam (XANAX) 0.25 MG tablet     . amiodarone (PACERONE) 200 MG tablet Take 1 tablet (200 mg total) by mouth daily. 30 tablet 6  .  apixaban (ELIQUIS) 5 MG TABS tablet Take 1 tablet (5 mg total) by mouth 2 (two) times daily. 60 tablet 5  . Calcium Carbonate-Vitamin D (CALTRATE 600+D PO) Take 1 tablet by mouth 2 (two) times daily.    . cetirizine (ZYRTEC) 10 MG tablet Take 10 mg by mouth daily.     . Cholecalciferol (VITAMIN D3) 1000 UNITS tablet Take 1,000 Units by mouth at bedtime.     . furosemide (LASIX) 20 MG tablet TAKE 1 TABLET BY MOUTH DAILY 30 tablet 3  . labetalol (NORMODYNE) 300 MG tablet Take 1 tablet (300 mg total) by mouth 2 (two) times daily. 60 tablet 6  .  multivitamin (THERAGRAN) per tablet Take 1 tablet by mouth at bedtime.     . potassium chloride SA (K-DUR,KLOR-CON) 20 MEQ tablet TAKE 2 TABLETS BY MOUTH ONCE DAILY 60 tablet 6  . traMADol (ULTRAM) 50 MG tablet Take 50 mg by mouth 2 (two) times daily as needed for moderate pain.    . valsartan (DIOVAN) 320 MG tablet TAKE 1 TABLET BY MOUTH DAILY 30 tablet 1  . WELCHOL 625 MG tablet Take 625-1,250 mg by mouth 2 (two) times daily. Take 625 mg every morning and 1250 mg at bedtime.     No current facility-administered medications for this encounter.    Allergies  Allergen Reactions  . Penicillins Anaphylaxis  . Statins Other (See Comments)    REACTION: elevated LFT's  . Tylenol [Acetaminophen] Other (See Comments)    If taken with Vytorin at risk for liver damage  . Tape Other (See Comments)    Redness, Please use "paper" tape only.  . Codeine Other (See Comments)    REACTION: feel funny    Social History   Social History  . Marital Status: Married    Spouse Name: N/A  . Number of Children: N/A  . Years of Education: N/A   Occupational History  . Not on file.   Social History Main Topics  . Smoking status: Former Smoker    Types: Cigarettes    Quit date: 07/16/1990  . Smokeless tobacco: Never Used  . Alcohol Use: No  . Drug Use: No  . Sexual Activity: No   Other Topics Concern  . Not on file   Social History Narrative   Pt lives in [place] with spouse.  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 Coronary artery disease    . Alzheimer's disease      ROS- All systems are reviewed and negative except as per the HPI above  Physical Exam: Filed Vitals:   09/04/15 1035  BP: 112/70  Pulse: 77  Height: 5' 6"  (1.676 m)  Weight: 170 lb 9.6 oz (77.384 kg)    GEN- The patient is well appearing, alert and oriented x 3 today.   Head- normocephalic, atraumatic Eyes-  Sclera clear, conjunctiva  pink Ears- hearing intact Oropharynx- clear Neck- supple, no JVP Lymph- no cervical lymphadenopathy Lungs- Clear to ausculation bilaterally, normal work of breathing Heart- Regular(v paced) rate and rhythm, no murmurs, rubs or gallops, PMI not laterally displaced GI- soft, NT, ND, + BS Extremities- no clubbing, cyanosis, or edema MS- no significant deformity or atrophy Skin- no rash or lesion Psych- euthymic mood, full affect Neuro- strength and sensation are intact  EKG-Ventricular paced rhythm at 77 bpm Interrogated and shows underlying afib since last Thursday  2/16  Assessment and Plan: 1. Afib  Persistent  x 5 days despite increasing amiodarone to 200 mg bid Will plan on cardioversion  Continue labetelol Will continue  amiodarone at 200 mg bid until cardioversion and decrease back to 200 mg daily Do not miss any doses of eliquis which pt tells me that she has not missed any doses in the past  Cbc, bmet mag today  2. PPM Interogated today and with normal functioning device Persistent afib  F/u afib clinic one week post cardioversion  Butch Penny C. Jemell Town, [facility] [address] [place], [place] [address] [phone]

## 2015-09-06 NOTE — Anesthesia Procedure Notes (Signed)
Procedure Name: MAC Date/Time: 09/06/2015 10:50 AM Performed by: Jenne Campus Pre-anesthesia Checklist: Patient identified, Emergency Drugs available, Suction available, Patient being monitored and Timeout performed Patient Re-evaluated:Patient Re-evaluated prior to inductionOxygen Delivery Method: Ambu bag

## 2015-09-06 NOTE — Discharge Instructions (Signed)
Electrical Cardioversion, Care After °Refer to this sheet in the next few weeks. These instructions provide you with information on caring for yourself after your procedure. Your health care provider may also give you more specific instructions. Your treatment has been planned according to current medical practices, but problems sometimes occur. Call your health care provider if you have any problems or questions after your procedure. °WHAT TO EXPECT AFTER THE PROCEDURE °After your procedure, it is typical to have the following sensations: °· Some redness on the skin where the shocks were delivered. If this is tender, a sunburn lotion or hydrocortisone cream may help. °· Possible return of an abnormal heart rhythm within hours or days after the procedure. °HOME CARE INSTRUCTIONS °· Take medicines only as directed by your health care provider. Be sure you understand how and when to take your medicine. °· Learn how to feel your pulse and check it often. °· Limit your activity for 48 hours after the procedure or as directed by your health care provider. °· Avoid or minimize caffeine and other stimulants as directed by your health care provider. °SEEK MEDICAL CARE IF: °· You feel like your heart is beating too fast or your pulse is not regular. °· You have any questions about your medicines. °· You have bleeding that will not stop. °SEEK IMMEDIATE MEDICAL CARE IF: °· You are dizzy or feel faint. °· It is hard to breathe or you feel short of breath. °· There is a change in discomfort in your chest. °· Your speech is slurred or you have trouble moving an arm or leg on one side of your body. °· You get a serious muscle cramp that does not go away. °· Your fingers or toes turn cold or blue. °  °This information is not intended to replace advice given to you by your health care provider. Make sure you discuss any questions you have with your health care provider. °  °Document Released: 04/21/2013 Document Revised: 07/22/2014  Document Reviewed: 04/21/2013 °Elsevier Interactive Patient Education ©2016 [employer] ° °

## 2015-09-06 NOTE — Anesthesia Postprocedure Evaluation (Signed)
Anesthesia Post Note  Patient: [name]  Procedure(s) Performed: Procedure(s) (LRB): CARDIOVERSION (N/A)  Patient location during evaluation: PACU Anesthesia Type: General Level of consciousness: awake and alert Pain management: pain level controlled Vital Signs Assessment: post-procedure vital signs reviewed and stable Respiratory status: spontaneous breathing Cardiovascular status: blood pressure returned to baseline Anesthetic complications: no    Last Vitals:  Filed Vitals:   09/06/15 1120 09/06/15 1128  BP: 106/42 113/41  Pulse: 60 57  Temp:    Resp: 16 17    Last Pain: There were no vitals filed for this visit.               [name] [name]

## 2015-09-06 NOTE — Telephone Encounter (Signed)
REFILL 

## 2015-09-07 ENCOUNTER — Encounter (HOSPITAL_COMMUNITY): Payer: Medicare Other

## 2015-09-07 ENCOUNTER — Encounter (HOSPITAL_COMMUNITY): Payer: Self-pay | Admitting: Cardiology

## 2015-09-14 ENCOUNTER — Inpatient Hospital Stay (HOSPITAL_COMMUNITY): Admission: RE | Admit: 2015-09-14 | Payer: Medicare Other | Source: Ambulatory Visit | Admitting: Nurse Practitioner

## 2015-09-15 ENCOUNTER — Encounter (HOSPITAL_COMMUNITY): Payer: Self-pay | Admitting: Nurse Practitioner

## 2015-09-15 ENCOUNTER — Ambulatory Visit (HOSPITAL_COMMUNITY)
Admission: RE | Admit: 2015-09-15 | Discharge: 2015-09-15 | Disposition: A | Discharge status: Home / Self Care | Payer: Medicare Other | Source: Ambulatory Visit | Attending: Nurse Practitioner | Admitting: Nurse Practitioner

## 2015-09-15 VITALS — BP 152/88 | HR 82 | Ht 66.0 in | Wt 175.0 lb

## 2015-09-15 DIAGNOSIS — Z7902 Long term (current) use of antithrombotics/antiplatelets: Secondary | ICD-10-CM | POA: Diagnosis not present

## 2015-09-15 DIAGNOSIS — Z79899 Other long term (current) drug therapy: Secondary | ICD-10-CM | POA: Insufficient documentation

## 2015-09-15 DIAGNOSIS — G709 Myoneural disorder, unspecified: Secondary | ICD-10-CM | POA: Diagnosis not present

## 2015-09-15 DIAGNOSIS — I2510 Atherosclerotic heart disease of native coronary artery without angina pectoris: Secondary | ICD-10-CM | POA: Insufficient documentation

## 2015-09-15 DIAGNOSIS — E785 Hyperlipidemia, unspecified: Secondary | ICD-10-CM | POA: Insufficient documentation

## 2015-09-15 DIAGNOSIS — Z950 Presence of cardiac pacemaker: Secondary | ICD-10-CM | POA: Diagnosis not present

## 2015-09-15 DIAGNOSIS — I4819 Other persistent atrial fibrillation: Secondary | ICD-10-CM

## 2015-09-15 DIAGNOSIS — M353 Polymyalgia rheumatica: Secondary | ICD-10-CM | POA: Diagnosis not present

## 2015-09-15 DIAGNOSIS — R600 Localized edema: Secondary | ICD-10-CM | POA: Diagnosis not present

## 2015-09-15 DIAGNOSIS — Z8673 Personal history of transient ischemic attack (TIA), and cerebral infarction without residual deficits: Secondary | ICD-10-CM | POA: Diagnosis not present

## 2015-09-15 DIAGNOSIS — I4891 Unspecified atrial fibrillation: Secondary | ICD-10-CM | POA: Insufficient documentation

## 2015-09-15 DIAGNOSIS — I10 Essential (primary) hypertension: Secondary | ICD-10-CM | POA: Diagnosis not present

## 2015-09-15 DIAGNOSIS — Z87891 Personal history of nicotine dependence: Secondary | ICD-10-CM | POA: Insufficient documentation

## 2015-09-15 DIAGNOSIS — I481 Persistent atrial fibrillation: Secondary | ICD-10-CM

## 2015-09-15 NOTE — Patient Instructions (Signed)
Your physician has recommended you make the following change in your medication:  1) increase Lasix to 40 mg for 3 days. On Tuesday go back to normal dose.   2) Follow up with Caryl Comes on 10/09/2015

## 2015-09-15 NOTE — Progress Notes (Signed)
Patient ID: [name] [name], female   DOB: 03/14/1931, 82 y.o.   MRN: [id number]     Primary Care Physician: [name], MD Referring Physician:Dr. Saralyn Willison is a 82 y.o. female with a h/o afib on amiodarone that had successful cardioversion 2/22. She is in atrial paced rhythm today and feels she has been maintaining SR. She has noticed her BP climbing over the last week and weight is up 5 lbs compared to my last note.   Today, she denies symptoms of palpitations, chest pain, shortness of breath, orthopnea, PND, lower extremity edema, dizziness, presyncope, syncope, or neurologic sequela. The patient is tolerating medications without difficulties and is otherwise without complaint today.   Past Medical History  Diagnosis Date  . Persistent atrial fibrillation ([place])   . sinus node dysfunction//post termination pauses   . HTN (hypertension)   . CAD (coronary artery disease)     LHC 5/08:  pOM 20%, pRCA 20-30%, EF 60%  . NASH (nonalcoholic steatohepatitis)   . PMR (polymyalgia rheumatica) (HCC)     no steriods for 5 years  . HLD (hyperlipidemia)   . Neuromuscular disorder ([place])   . Hepatitis     hx of medication induced hepatitis (Vytorin per pt)  . Ascending aorta enlargement (HCC)     4.8 cm per echo 08/07/11; 5.0 by CT in Jan 2013  . Pacemaker-Medtronic 11/12/2011    Implanted 2013   . Hx of echocardiogram     echo 1/13: EF 55%, Gr 2 diast dysfn, Asc Ao aneurysm 4.8 cm, mild MR, mod LAE, mild RAE  . ICH (intracerebral hemorrhage) ([place]) 03/24/15  . Stroke ([place])   . History of loop recorder    Past Surgical History  Procedure Laterality Date  . Umbilical hernia repair  1950's  . Incision and drainage breast abscess  1973  . Cardiac catheterization    . Hernia repair    . Pacemaker insertion  Jan 2013  . Cardioversion  03/02/2012    Procedure: CARDIOVERSION;  Surgeon: Lelon Perla, MD;  Location: [facility];  Service: Cardiovascular;  Laterality: N/A;  .  Cardioversion  03/13/2012    Procedure: CARDIOVERSION;  Surgeon: Josue Hector, MD;  Location: [facility] ENDOSCOPY;  Service: Cardiovascular;  Laterality: N/A;  . Cardioversion  04/15/2012    Procedure: CARDIOVERSION;  Surgeon: [name], MD;  Location: [facility] ENDOSCOPY;  Service: Cardiovascular;  Laterality: N/A;  . Cardioversion N/A 06/07/2014    Procedure: CARDIOVERSION;  Surgeon: [name], MD;  Location: [facility];  Service: Cardiovascular;  Laterality: N/A;  . Permanent pacemaker insertion N/A 07/29/2011    Procedure: PERMANENT PACEMAKER INSERTION;  Surgeon: [name], MD;  Location: [facility] CATH LAB;  Service: Cardiovascular;  Laterality: N/A;  . Cardioversion N/A 08/18/2014    Procedure: CARDIOVERSION;  Surgeon: [name], MD;  Location: [facility];  Service: Cardiovascular;  Laterality: N/A;  . Cardioversion N/A 09/06/2015    Procedure: CARDIOVERSION;  Surgeon: Jerline Pain, MD;  Location: [facility] ENDOSCOPY;  Service: Cardiovascular;  Laterality: N/A;    Current Outpatient Prescriptions  Medication Sig Dispense Refill  . ALPRAZolam (XANAX) 0.25 MG tablet Take 0.25 mg by mouth daily as needed for anxiety. Takes as needed    . amiodarone (PACERONE) 200 MG tablet Take 1 tablet (200 mg total) by mouth daily. 30 tablet 6  . apixaban (ELIQUIS) 5 MG TABS tablet Take 1 tablet (5 mg total) by mouth 2 (two) times daily. 60 tablet 5  .  Calcium Carbonate-Vitamin D (CALTRATE 600+D PO) Take 1 tablet by mouth 2 (two) times daily.    . cetirizine (ZYRTEC) 10 MG tablet Take 10 mg by mouth daily as needed for allergies.     . Cholecalciferol (VITAMIN D3) 1000 UNITS tablet Take 1,000 Units by mouth at bedtime.     . furosemide (LASIX) 20 MG tablet Take 1 tablet (20 mg total) by mouth daily. KEEP OV. 30 tablet 2  . labetalol (NORMODYNE) 300 MG tablet Take 1 tablet (300 mg total) by mouth 2 (two) times daily. 60 tablet 6  . multivitamin (THERAGRAN) per tablet Take 1 tablet by mouth at bedtime.       . potassium chloride SA (K-DUR,KLOR-CON) 20 MEQ tablet TAKE 2 TABLETS BY MOUTH ONCE DAILY 60 tablet 6  . traMADol (ULTRAM) 50 MG tablet Take 50 mg by mouth daily as needed (for headache).     . valsartan (DIOVAN) 320 MG tablet TAKE 1 TABLET BY MOUTH DAILY 30 tablet 1  . WELCHOL 625 MG tablet Take 625-1,250 mg by mouth 2 (two) times daily. Take 625 mg every morning and 1250 mg at bedtime.     No current facility-administered medications for this encounter.    Allergies: PCN's, statins, tylenol  Social History   Social History  . Marital Status: Married    Spouse Name: N/A  . Number of Children: N/A  . Years of Education: N/A   Occupational History  . Not on file.   Social History Main Topics  . Smoking status: Former Smoker    Types: Cigarettes    Quit date: 07/16/1990  . Smokeless tobacco: Never Used  . Alcohol Use: No  . Drug Use: No  . Sexual Activity: No   Other Topics Concern  . Not on file   Social History Narrative   Pt lives in [place] with spouse.  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 Coronary artery disease    . Alzheimer's disease      ROS- All systems are reviewed and negative except as per the HPI above  Physical Exam: Filed Vitals:   09/15/15 1145  BP: 152/88  Pulse: 82  Height: 5' 6"  (1.676 m)  Weight: 175 lb (79.379 kg)    GEN- The patient is well appearing, alert and oriented x 3 today.   Head- normocephalic, atraumatic Eyes-  Sclera clear, conjunctiva pink Ears- hearing intact Oropharynx- clear Neck- supple, no JVP Lymph- no cervical lymphadenopathy Lungs- Clear to ausculation bilaterally, normal work of breathing Heart- Regular rate and rhythm, no murmurs, rubs or gallops, PMI not laterally displaced GI- soft, NT, ND, + BS Extremities- no clubbing, cyanosis, or edema MS- no significant deformity or atrophy Skin- no rash or lesion Psych- euthymic mood, full  affect Neuro- strength and sensation are intact  EKG-atrial paced rhythm with v rate of 82 bpm, pr int 322 ms, qrs int 116 ms, qtc 493 ms. Epic records reviewed  Assessment and Plan: 1. afib S/P Successful DCCV 2/22 Continue amiodarone   2. HTN  Elevated for last week Lasix to be increased  3. LEE  More noticeable x one week Wt is up 5 lbs Increase lasix to 40 mg x 3 days Daily weights  4. PPM Per EP  F/u as scheduled with Dr. Caryl Comes as scheduled 3/27 afib clinic as needed  [address], [facility] [place], [place]  [address] [phone]      

## 2015-09-18 ENCOUNTER — Other Ambulatory Visit: Payer: Self-pay | Admitting: Internal Medicine

## 2015-10-03 ENCOUNTER — Encounter: Payer: Self-pay | Admitting: Internal Medicine

## 2015-10-05 ENCOUNTER — Other Ambulatory Visit: Payer: Self-pay | Admitting: Cardiothoracic Surgery

## 2015-10-05 DIAGNOSIS — I7789 Other specified disorders of arteries and arterioles: Secondary | ICD-10-CM

## 2015-10-09 ENCOUNTER — Encounter: Payer: Self-pay | Admitting: Internal Medicine

## 2015-10-09 ENCOUNTER — Ambulatory Visit (INDEPENDENT_AMBULATORY_CARE_PROVIDER_SITE_OTHER): Payer: Medicare Other | Admitting: Internal Medicine

## 2015-10-09 ENCOUNTER — Ambulatory Visit (HOSPITAL_COMMUNITY)
Admission: RE | Admit: 2015-10-09 | Discharge: 2015-10-09 | Disposition: A | Discharge status: Home / Self Care | Payer: Medicare Other | Source: Ambulatory Visit | Attending: Nurse Practitioner | Admitting: Nurse Practitioner

## 2015-10-09 VITALS — BP 150/90 | HR 84 | Ht 66.0 in | Wt 168.0 lb

## 2015-10-09 DIAGNOSIS — I481 Persistent atrial fibrillation: Secondary | ICD-10-CM | POA: Diagnosis not present

## 2015-10-09 DIAGNOSIS — Z950 Presence of cardiac pacemaker: Secondary | ICD-10-CM

## 2015-10-09 DIAGNOSIS — I4891 Unspecified atrial fibrillation: Secondary | ICD-10-CM | POA: Insufficient documentation

## 2015-10-09 DIAGNOSIS — I4819 Other persistent atrial fibrillation: Secondary | ICD-10-CM

## 2015-10-09 DIAGNOSIS — I495 Sick sinus syndrome: Secondary | ICD-10-CM | POA: Diagnosis not present

## 2015-10-09 LAB — CUP PACEART INCLINIC DEVICE CHECK
Battery Impedance: 260 Ohm
Brady Statistic AP VP Percent: 1 %
Brady Statistic AP VS Percent: 98 %
Brady Statistic AS VS Percent: 1 %
Implantable Lead Implant Date: 20130114
Implantable Lead Location: 753860
Lead Channel Impedance Value: 820 Ohm
Lead Channel Pacing Threshold Amplitude: 0.5 V
Lead Channel Pacing Threshold Amplitude: 0.75 V
Lead Channel Sensing Intrinsic Amplitude: 22.4 mV
Lead Channel Sensing Intrinsic Amplitude: 4 mV
Lead Channel Setting Pacing Pulse Width: 0.4 ms
Lead Channel Setting Sensing Sensitivity: 5.6 mV
MDC IDC LEAD IMPLANT DT: 20130114
MDC IDC LEAD LOCATION: 753859
MDC IDC LEAD MODEL: 1948
MDC IDC MSMT BATTERY REMAINING LONGEVITY: 110 mo
MDC IDC MSMT BATTERY VOLTAGE: 2.79 V
MDC IDC MSMT LEADCHNL RA IMPEDANCE VALUE: 453 Ohm
MDC IDC MSMT LEADCHNL RA PACING THRESHOLD PULSEWIDTH: 0.4 ms
MDC IDC MSMT LEADCHNL RV PACING THRESHOLD PULSEWIDTH: 0.4 ms
MDC IDC SESS DTM: 20170327173049
MDC IDC SET LEADCHNL RA PACING AMPLITUDE: 2 V
MDC IDC SET LEADCHNL RV PACING AMPLITUDE: 2.5 V
MDC IDC STAT BRADY AS VP PERCENT: 0 %

## 2015-10-09 LAB — PULMONARY FUNCTION TEST
DL/VA % PRED: 67 %
DL/VA: 3.32 ml/min/mmHg/L
DLCO UNC % PRED: 59 %
DLCO UNC: 15.21 ml/min/mmHg
FEF 25-75 POST: 1.38 L/s
FEF 25-75 PRE: 1.56 L/s
FEF2575-%Change-Post: -11 %
FEF2575-%PRED-POST: 101 %
FEF2575-%Pred-Pre: 115 %
FEV1-%CHANGE-POST: -3 %
FEV1-%Pred-Post: 105 %
FEV1-%Pred-Pre: 109 %
FEV1-POST: 2.06 L
FEV1-Pre: 2.14 L
FEV1FVC-%Change-Post: -2 %
FEV1FVC-%PRED-PRE: 101 %
FEV6-%CHANGE-POST: -2 %
FEV6-%PRED-POST: 111 %
FEV6-%Pred-Pre: 113 %
FEV6-Post: 2.75 L
FEV6-Pre: 2.81 L
FEV6FVC-%CHANGE-POST: 0 %
FEV6FVC-%Pred-Post: 103 %
FEV6FVC-%Pred-Pre: 103 %
FVC-%Change-Post: -1 %
FVC-%PRED-POST: 108 %
FVC-%Pred-Pre: 110 %
FVC-Post: 2.86 L
FVC-Pre: 2.9 L
POST FEV1/FVC RATIO: 72 %
PRE FEV1/FVC RATIO: 74 %
Post FEV6/FVC ratio: 98 %
Pre FEV6/FVC Ratio: 97 %
RV % pred: 80 %
RV: 2.01 L
TLC % pred: 95 %
TLC: 4.97 L

## 2015-10-09 MED ORDER — POTASSIUM CHLORIDE ER 10 MEQ PO TBCR: 10.0000 meq | EXTENDED_RELEASE_TABLET | Freq: Two times a day (BID) | ORAL | Status: DC

## 2015-10-09 MED ORDER — ALBUTEROL SULFATE (2.5 MG/3ML) 0.083% IN NEBU
2.5000 mg | INHALATION_SOLUTION | Freq: Once | RESPIRATORY_TRACT | Status: AC
  Administered 2015-10-09: 2.5 mg via RESPIRATORY_TRACT

## 2015-10-09 MED ORDER — DILTIAZEM HCL ER COATED BEADS 120 MG PO CP24: 120.0000 mg | ORAL_CAPSULE | Freq: Every day | ORAL | Status: DC

## 2015-10-09 NOTE — Progress Notes (Signed)
Patient Care Team: [name], MD as PCP - General (Internal Medicine) [name], MD (Cardiology)   HPI  [name] [name] is a 82 y.o. female  Seen in followup for atrial fibrillation in the setting of modest nonobstructive coronary disease and normal left ventricular function. She is status post pacemaker implantation for sinus node dysfunction and posttermination pauses. She's been treated with flecainide and subsequently with Rythmol and most recently amiodarone. She takes apixaban  She has had periodic atrial fibrillation associated with symptoms prompting cardioversion most recently 2/17 She saw Dr. Rayann Heman to consider catheter ablation but at that juncture wanted to continue taking amiodarone.     She was seen by DC in the A. fib clinic a couple of weeks ago; weight was up edema was present at her diuretics were increased.   She also has a thoracic aortic aneurysm followed by Dr. Servando Snare  IN Feb she underwent cardioversion for recurrent afib, was seen shortly thereafeter by [facility] and amio was decreased   Pulmonary function testing was repeated under the direction of DC. DLCO is unchanged  Blood pressures at home have been elevated in the 150 range.        Past Medical History  Diagnosis Date  . Persistent atrial fibrillation ([place])   . sinus node dysfunction//post termination pauses   . HTN (hypertension)   . CAD (coronary artery disease)     LHC 5/08:  pOM 20%, pRCA 20-30%, EF 60%  . NASH (nonalcoholic steatohepatitis)   . PMR (polymyalgia rheumatica) (HCC)     no steriods for 5 years  . HLD (hyperlipidemia)   . Neuromuscular disorder ([place])   . Hepatitis     hx of medication induced hepatitis (Vytorin per pt)  . Ascending aorta enlargement (HCC)     4.8 cm per echo 08/07/11; 5.0 by CT in Jan 2013  . Pacemaker-Medtronic 11/12/2011    Implanted 2013   . Hx of echocardiogram     echo 1/13: EF 55%, Gr 2 diast dysfn, Asc Ao aneurysm 4.8 cm, mild MR, mod LAE,  mild RAE  . ICH (intracerebral hemorrhage) ([place]) 03/24/15  . Stroke ([place])   . History of loop recorder     Past Surgical History  Procedure Laterality Date  . Umbilical hernia repair  1950's  . Incision and drainage breast abscess  1973  . Cardiac catheterization    . Hernia repair    . Pacemaker insertion  Jan 2013  . Cardioversion  03/02/2012    Procedure: CARDIOVERSION;  Surgeon: Lelon Perla, MD;  Location: [facility];  Service: Cardiovascular;  Laterality: N/A;  . Cardioversion  03/13/2012    Procedure: CARDIOVERSION;  Surgeon: Josue Hector, MD;  Location: [facility] ENDOSCOPY;  Service: Cardiovascular;  Laterality: N/A;  . Cardioversion  04/15/2012    Procedure: CARDIOVERSION;  Surgeon: [name], MD;  Location: [facility] ENDOSCOPY;  Service: Cardiovascular;  Laterality: N/A;  . Cardioversion N/A 06/07/2014    Procedure: CARDIOVERSION;  Surgeon: [name], MD;  Location: [facility];  Service: Cardiovascular;  Laterality: N/A;  . Permanent pacemaker insertion N/A 07/29/2011    Procedure: PERMANENT PACEMAKER INSERTION;  Surgeon: [name], MD;  Location: [facility] CATH LAB;  Service: Cardiovascular;  Laterality: N/A;  . Cardioversion N/A 08/18/2014    Procedure: CARDIOVERSION;  Surgeon: [name], MD;  Location: [place];  Service: Cardiovascular;  Laterality: N/A;  . Cardioversion N/A 09/06/2015    Procedure: CARDIOVERSION;  Surgeon: Thana Farr  Marlou Porch, MD;  Location: [facility] ENDOSCOPY;  Service: Cardiovascular;  Laterality: N/A;    Current Outpatient Prescriptions  Medication Sig Dispense Refill  . ALPRAZolam (XANAX) 0.25 MG tablet Take 0.25 mg by mouth daily as needed for anxiety. Takes as needed    . amiodarone (PACERONE) 200 MG tablet Take 1 tablet (200 mg total) by mouth daily. 30 tablet 6  . amiodarone (PACERONE) 200 MG tablet TAKE 1 TABLET BY MOUTH DAILY 30 tablet 1  . apixaban (ELIQUIS) 5 MG TABS tablet Take 1 tablet (5 mg total) by mouth 2 (two) times daily. 60 tablet  5  . Calcium Carbonate-Vitamin D (CALTRATE 600+D PO) Take 1 tablet by mouth 2 (two) times daily.    . cetirizine (ZYRTEC) 10 MG tablet Take 10 mg by mouth daily as needed for allergies.     . Cholecalciferol (VITAMIN D3) 1000 UNITS tablet Take 1,000 Units by mouth at bedtime.     . furosemide (LASIX) 20 MG tablet Take 1 tablet (20 mg total) by mouth daily. KEEP OV. 30 tablet 2  . labetalol (NORMODYNE) 300 MG tablet Take 1 tablet (300 mg total) by mouth 2 (two) times daily. 60 tablet 6  . multivitamin (THERAGRAN) per tablet Take 1 tablet by mouth at bedtime.     . potassium chloride SA (K-DUR,KLOR-CON) 20 MEQ tablet TAKE 2 TABLETS BY MOUTH ONCE DAILY 60 tablet 6  . traMADol (ULTRAM) 50 MG tablet Take 50 mg by mouth daily as needed (for headache).     . valsartan (DIOVAN) 320 MG tablet TAKE 1 TABLET BY MOUTH DAILY 30 tablet 1  . WELCHOL 625 MG tablet Take 625-1,250 mg by mouth 2 (two) times daily. Take 625 mg every morning and 1250 mg at bedtime.     No current facility-administered medications for this visit.    Allergies  Allergen Reactions  . Penicillins Anaphylaxis    Has patient had a PCN reaction causing immediate rash, facial/tongue/throat swelling, SOB or lightheadedness with hypotension: Yes Has patient had a PCN reaction causing severe rash involving mucus membranes or skin necrosis: No Has patient had a PCN reaction that required hospitalization No Has patient had a PCN reaction occurring within the last 10 years: No If all of the above answers are "NO", then may proceed with Cephalosporin use.   . Statins Other (See Comments)    REACTION: elevated LFT's  . Tylenol [Acetaminophen] Other (See Comments)    If taken with Vytorin at risk for liver damage  . Tape Other (See Comments)    Redness, Please use "paper" tape only.  . Codeine Other (See Comments)    feel funny, head is fuzzy    Review of Systems negative except from HPI and PMH  Physical Exam BP 150/90 mmHg  Pulse  84  Ht 5' 6"  (1.676 m)  Wt 168 lb (76.204 kg)  BMI 27.13 kg/m2 Well developed and nourished in no acute distress HENT normal Neck supple with JVP 8  Clear Irregularly irregular rate and rhythm, no murmurs or gallops Abd-soft with active BS No Clubbing cyanosis  R>L 2+ edema.; nonpitting and involving primarily the forefoot Skin-warm and dry A & Oriented  Grossly normal sensory and motor function   ECG was ordered.  ECG demonstrates atrial pacing at  75 25/11/40 Axis left -48   Ovary function tests were reviewed. DLCO was 62%. Other laboratories were reviewed including amiodarone surveillance laboratories normal 2/16 and 1/60.  Assessment and  Plan  Edema  Atrial fibrillaton paroxysmal  Sinus node dysfunction   Thoracic aortic aneurysm  Hypertension  High risk medication surveillance  Pacemaker The patient's device was interrogated.  The information was reviewed. No changes were made in the programming.        She continues to have relatively well-controlled atrial fibrillation on her amiodarone. We will continue the current strategy i.e. periodic cardioversion and maintaining her current dose. Surveillance laboratories including DLCO are stable.  Her blood pressure is elevated. In the context of her thoracic aneurysm, this becomes more important. She did not tolerate amlodipine edema. We will add low-dose diltiazem. She has not had constipation. The cross-reactivity with edema is relatively low. The other medication strategy that I thought about was to switch her furosemide for spironolactone but in the context of her valsartan will refrain from another hyperkalemic medication

## 2015-10-09 NOTE — Patient Instructions (Signed)
Medication Instructions: 1) Start diltiazem 120 mg once daily 2) Decrease potassium to 10 meq one tablet twice daily  Labwork: - none  Procedures/Testing: - none  Follow-Up: - Remote monitoring is used to monitor your Pacemaker of ICD from home. This monitoring reduces the number of office visits required to check your device to one time per year. It allows [place] to keep an eye on the functioning of your device to ensure it is working properly. You are scheduled for a device check from home on 01/08/16. You may send your transmission at any time that day. If you have a wireless device, the transmission will be sent automatically. After your physician reviews your transmission, you will receive a postcard with your next transmission date.  - Your physician wants you to follow-up in: 1 year with Dr. Caryl Comes. You will receive a reminder letter in the mail two months in advance. If you don't receive a letter, please call our office to schedule the follow-up appointment.  Any Additional Special Instructions Will Be Listed Below (If Applicable).     If you need a refill on your cardiac medications before your next appointment, please call your pharmacy.

## 2015-10-11 ENCOUNTER — Other Ambulatory Visit: Payer: Self-pay | Admitting: Cardiothoracic Surgery

## 2015-10-11 DIAGNOSIS — I7789 Other specified disorders of arteries and arterioles: Secondary | ICD-10-CM

## 2015-10-24 ENCOUNTER — Other Ambulatory Visit: Payer: Self-pay | Admitting: Internal Medicine

## 2015-10-24 ENCOUNTER — Other Ambulatory Visit: Payer: Self-pay

## 2015-10-24 DIAGNOSIS — I480 Paroxysmal atrial fibrillation: Secondary | ICD-10-CM

## 2015-10-24 MED ORDER — APIXABAN 5 MG PO TABS: 5.0000 mg | ORAL_TABLET | Freq: Two times a day (BID) | ORAL | Status: DC

## 2015-11-01 ENCOUNTER — Ambulatory Visit (INDEPENDENT_AMBULATORY_CARE_PROVIDER_SITE_OTHER): Payer: Medicare Other | Admitting: Cardiothoracic Surgery

## 2015-11-01 ENCOUNTER — Ambulatory Visit
Admission: RE | Admit: 2015-11-01 | Discharge: 2015-11-01 | Disposition: A | Discharge status: Home / Self Care | Payer: Medicare Other | Source: Ambulatory Visit | Attending: Cardiothoracic Surgery | Admitting: Cardiothoracic Surgery

## 2015-11-01 ENCOUNTER — Encounter: Payer: Self-pay | Admitting: Cardiothoracic Surgery

## 2015-11-01 VITALS — BP 126/88 | HR 92 | Resp 20 | Ht 66.0 in | Wt 168.0 lb

## 2015-11-01 DIAGNOSIS — I712 Thoracic aortic aneurysm, without rupture, unspecified: Secondary | ICD-10-CM

## 2015-11-01 DIAGNOSIS — I7789 Other specified disorders of arteries and arterioles: Secondary | ICD-10-CM

## 2015-11-01 MED ORDER — IOPAMIDOL (ISOVUE-370) INJECTION 76%
75.0000 mL | Freq: Once | INTRAVENOUS | Status: AC | PRN
  Administered 2015-11-01: 75 mL via INTRAVENOUS

## 2015-11-01 NOTE — Progress Notes (Signed)
[address]       [place] [address]             [phone]        Daveah W Karwowski [facility] Medical Record #[id number] Date of Birth: 1930-11-28  Referring: [name], MD Primary Care: [name], MD  Chief Complaint:    Chief Complaint  Patient presents with  . Thoracic Aortic Aneurysm    1 year f/u with CTA Chest    History of Present Illness:    Patient is a 82 -year-old female with a history of atrial fib on Coumadin.  July 29 2011 a pacemaker was placed. A followup an echocardiogram was performed that demonstrated dilated descending aorta leading to  evaluation with a CT scan of the chest .  She has no history of aortic valve disease.  The patient returns today with a followup CT scan for comparison to the scan done in 12 months ago.  She has no family history of aneurysm disease or sudden death, her grandfather did die in his 46s of a cerebral aneurysm.  She has been diligent about blood pressure control and taking her beta blocker but notes it is as high as 140's /80's. She was also on Norvasc but last month stopped it because of lower extremity edema. This edema has persisted. She is also on amiodarone, there was a concern by cardiology about gait disturbance, she notes she still has some instability with ambulation.  Patient denies any shortness of breath or chest discomfort at rest, some mild sob with exertion , no angina.   She does note over the past year she had "stroke" described as a cerebral bleed , with residual tremor of her hands    Current Activity/ Functional Status: Patient is independent with mobility/ambulation, transfers, ADL's, IADL's.   Zubrod Score: At the time of surgery this patient's most appropriate activity status/level should be described as: []     0    Normal activity, no symptoms []     1    Restricted in physical strenuous activity but ambulatory, able to do out light work [x]     2    Ambulatory and capable of  self care, unable to do work activities, up and about >50 % of waking hours                              []     3    Only limited self care, in bed greater than 50% of waking hours []     4    Completely disabled, no self care, confined to bed or chair []     5    Moribund   Past Medical History  Diagnosis Date  . Persistent atrial fibrillation ([facility])   . sinus node dysfunction//post termination pauses   . HTN (hypertension)   . CAD (coronary artery disease)     LHC 5/08:  pOM 20%, pRCA 20-30%, EF 60%  . NASH (nonalcoholic steatohepatitis)   . PMR (polymyalgia rheumatica) ([facility])     no steriods for 5 years  . HLD (hyperlipidemia)   . Neuromuscular disorder ([facility])   . Hepatitis     hx of medication induced hepatitis (Vytorin per pt)  . Ascending aorta enlargement ([facility])     4.8 cm per echo 08/07/11; 5.0 by CT in Jan 2013  . Pacemaker-Medtronic 11/12/2011    Implanted 2013   . Hx  of echocardiogram     echo 1/13: EF 55%, Gr 2 diast dysfn, Asc Ao aneurysm 4.8 cm, mild MR, mod LAE, mild RAE  . ICH (intracerebral hemorrhage) ([facility]) 03/24/15  . Stroke ([facility])   . History of loop recorder     Past Surgical History  Procedure Laterality Date  . Umbilical hernia repair  1950's  . Incision and drainage breast abscess  1973  . Cardiac catheterization    . Hernia repair    . Pacemaker insertion  Jan 2013  . Cardioversion  03/02/2012    Procedure: CARDIOVERSION;  Surgeon: Lelon Perla, MD;  Location: [facility];  Service: Cardiovascular;  Laterality: N/A;  . Cardioversion  03/13/2012    Procedure: CARDIOVERSION;  Surgeon: Josue Hector, MD;  Location: [facility] ENDOSCOPY;  Service: Cardiovascular;  Laterality: N/A;  . Cardioversion  04/15/2012    Procedure: CARDIOVERSION;  Surgeon: [name], MD;  Location: [facility] ENDOSCOPY;  Service: Cardiovascular;  Laterality: N/A;  . Cardioversion N/A 06/07/2014    Procedure: CARDIOVERSION;  Surgeon: [name], MD;  Location: [facility];  Service:  Cardiovascular;  Laterality: N/A;  . Permanent pacemaker insertion N/A 07/29/2011    Procedure: PERMANENT PACEMAKER INSERTION;  Surgeon: [name], MD;  Location: [facility] CATH LAB;  Service: Cardiovascular;  Laterality: N/A;  . Cardioversion N/A 08/18/2014    Procedure: CARDIOVERSION;  Surgeon: [name], MD;  Location: [facility];  Service: Cardiovascular;  Laterality: N/A;  . Cardioversion N/A 09/06/2015    Procedure: CARDIOVERSION;  Surgeon: Jerline Pain, MD;  Location: [facility] ENDOSCOPY;  Service: Cardiovascular;  Laterality: N/A;    Family History  Problem Relation Age of Onset  . Coronary artery disease    . Alzheimer's disease        History  Smoking status  . Former Smoker  . Types: Cigarettes  . Quit date: 07/16/1990  Smokeless tobacco  . Never Used    History  Alcohol Use No     Allergies  Allergen Reactions  . Penicillins Anaphylaxis    Has patient had a PCN reaction causing immediate rash, facial/tongue/throat swelling, SOB or lightheadedness with hypotension: Yes Has patient had a PCN reaction causing severe rash involving mucus membranes or skin necrosis: No Has patient had a PCN reaction that required hospitalization No Has patient had a PCN reaction occurring within the last 10 years: No If all of the above answers are "NO", then may proceed with Cephalosporin use.   . Statins Other (See Comments)    REACTION: elevated LFT's  . Tylenol [Acetaminophen] Other (See Comments)    If taken with Vytorin at risk for liver damage  . Tape Other (See Comments)    Redness, Please use "paper" tape only.  . Codeine Other (See Comments)    feel funny, head is fuzzy    Current Outpatient Prescriptions  Medication Sig Dispense Refill  . ALPRAZolam (XANAX) 0.25 MG tablet Take 0.25 mg by mouth daily as needed for anxiety. Takes as needed    . amiodarone (PACERONE) 200 MG tablet TAKE 1 TABLET BY MOUTH DAILY 30 tablet 1  . apixaban (ELIQUIS) 5 MG TABS tablet Take 1  tablet (5 mg total) by mouth 2 (two) times daily. 60 tablet 6  . Calcium Carbonate-Vitamin D (CALTRATE 600+D PO) Take 1 tablet by mouth 2 (two) times daily.    . cetirizine (ZYRTEC) 10 MG tablet Take 10 mg by mouth daily as needed for allergies.     . Cholecalciferol (VITAMIN D3)  1000 UNITS tablet Take 1,000 Units by mouth at bedtime.     Marland Kitchen diltiazem (CARDIZEM CD) 120 MG 24 hr capsule Take 1 capsule (120 mg total) by mouth daily. 90 capsule 3  . furosemide (LASIX) 20 MG tablet Take 1 tablet (20 mg total) by mouth daily. KEEP OV. 30 tablet 2  . labetalol (NORMODYNE) 300 MG tablet Take 1 tablet (300 mg total) by mouth 2 (two) times daily. 60 tablet 6  . multivitamin (THERAGRAN) per tablet Take 1 tablet by mouth at bedtime.     . potassium chloride (K-DUR) 10 MEQ tablet Take 1 tablet (10 mEq total) by mouth 2 (two) times daily. 180 tablet 3  . traMADol (ULTRAM) 50 MG tablet Take 50 mg by mouth daily as needed (for headache).     . valsartan (DIOVAN) 320 MG tablet TAKE 1 TABLET BY MOUTH DAILY 30 tablet 11  . WELCHOL 625 MG tablet Take 625-1,250 mg by mouth 2 (two) times daily. Take 625 mg every morning and 1250 mg at bedtime.     No current facility-administered medications for this visit.       Review of Systems:     Cardiac Review of Systems: Y or N  Chest Pain [ n   ]  Resting SOB [   ] Exertional SOB  [n  ]  Orthopnea Florencio.Farrier ]   Pedal Edema [ n  ]    Palpitations Blue.Reese  ] Syncope  Florencio.Farrier  ]   Presyncope Florencio.Farrier   ]  General Review of Systems: [Y] = yes [  ]=no Constitional: recent weight change [  ]; anorexia [  ]; fatigue [  ]; nausea [  ]; night sweats [  ]; fever [  ]; or chills [  ];                                                                                                                                          Dental: poor dentition[  n]; Last Dentist visit: regular every 6 months  Eye : blurred vision [  ]; diplopia [   ]; vision changes [  ];  Amaurosis fugax[  ]; Resp: cough [  ];   wheezing[  ];  hemoptysis[  ]; shortness of breath[  ]; paroxysmal nocturnal dyspnea[  ]; dyspnea on exertion[  ]; or orthopnea[  ];  GI:  gallstones[  ], vomiting[  ];  dysphagia[  ]; melena[  ];  hematochezia [  ]; heartburn[  ];   Hx of  Colonoscopy[  ]; GU: kidney stones [  ]; hematuria[  ];   dysuria [  ];  nocturia[  ];  history of     obstruction [  ];             Skin: rash, swelling[  ];, hair loss[  ];  peripheral edema[  ];  or  itching[  ]; Musculosketetal: myalgias[ y ];  joint swelling[ y ];  joint erythema[  ];  joint pain[  ];  back pain[  ];  Heme/Lymph: bruising[ n ];  bleeding[  n];  anemia[  ];  Neuro: TIA[  ];  headaches[  ];  stroke[  ];  vertigo[  ];  seizures[  ];   paresthesias[  ];  difficulty walking[  ];  Psych:depression[  ]; anxiety[  ];  Endocrine: diabetes[ n ];  thyroid dysfunction[ n ];  Immunizations: Flu Blue.Reese  ]; Pneumococcal[y  ];  Other:  Physical Exam: BP 126/88 mmHg  Pulse 92  Resp 20  Ht 5' 6"  (1.676 m)  Wt 168 lb (76.204 kg)  BMI 27.13 kg/m2  SpO2 98%  General appearance: alert, cooperative, appears stated age and no distress, she has increased difficulty standing up from sitting and moves slowly  Neurologic: intact Heart: irregularly irregular rhythm, she has no murmur of aortic insufficiency or MR Lungs: clear to auscultation bilaterally Abdomen: soft, non-tender; bowel sounds normal; no masses,  no organomegaly Extremities: extremities normal, atraumatic, no cyanosis  and Homans sign is negative, no sign of DVT, does have venous statis changes both lower extremities. Patient has 2+ pitting edema No carotid bruits, 1+ dp and PT pulses  Diagnostic Studies & Laboratory data:     Recent Radiology Findings:  Ct Angio Chest Aorta W/cm &/or Wo/cm  11/01/2015  CLINICAL DATA:  Followup thoracic aortic aneurysm EXAM: CT ANGIOGRAPHY CHEST WITH CONTRAST TECHNIQUE: Multidetector CT imaging of the chest was performed using the standard protocol during  bolus administration of intravenous contrast. Multiplanar CT image reconstructions and MIPs were obtained to evaluate the vascular anatomy. Creatinine was obtained on site at [facility] at [address] Results: Creatinine 1.0 mg/dL. CONTRAST:  75 mL Isovue 370. COMPARISON:  10/20/2014 FINDINGS: Vascular: The thoracic aorta is again well visualized. There is dilatation of the ascending aorta stable at 4.9 cm. Some mild motion artifact is noted. It measures approximately 33 mm at the sino-tubular junction an approximately 36 mm at the level of the sinus of Valsalva. Stable diameter of 3.1 cm in the distal aortic arch is noted. Some tapering is seen distal similar to that noted on the prior exam. Calcific plaque is seen without evidence of dissection. Although not timed for pulmonary embolism evaluation no large central embolus is noted within the pulmonary artery. The brachiocephalic vessels appear within normal limits with mild atherosclerotic changes. The visualized portions of the upper abdominal visceral vessels appear patent. Nonvascular: Some minimal scarring is noted in the right lower lobe stable from the prior exam. No focal infiltrate or sizable effusion is seen. No sizable parenchymal nodule is seen. Mild foci pleural thickening are seen posteriorly particularly on the left. No sizable hilar or mediastinal adenopathy is noted. The visualized portions of the upper abdomen are within normal limits. A small exophytic cyst is again noted from the upper pole of the right kidney. No acute bony abnormality is seen. Review of the MIP images confirms the above findings. IMPRESSION: Stable appearing aneurysmal dilatation of the thoracic aorta as described. Ascending thoracic aortic aneurysm. Recommend semi-annual imaging followup by CTA or MRA and referral to cardiothoracic surgery if not already obtained. This recommendation follows 2010 ACCF/AHA/AATS/ACR/ASA/SCA/SCAI/SIR/STS/SVM Guidelines for the  Diagnosis and Management of Patients With Thoracic Aortic Disease. Circulation. 2010; 121: D357-S177 No new focal abnormality is seen. Electronically Signed   By: Inez Catalina M.D.   On: 11/01/2015 15:02  Ct Angio Chest Aorta W/cm &/or Wo/cm  11/04/2013   CLINICAL DATA:  Ascending aortic enlargement  EXAM: CT ANGIOGRAPHY CHEST WITH CONTRAST  TECHNIQUE: Multidetector CT imaging of the chest was performed using the standard protocol during bolus administration of intravenous contrast. Multiplanar CT image reconstructions and MIPs were obtained to evaluate the vascular anatomy.  CONTRAST:  68m OMNIPAQUE IOHEXOL 350 MG/ML SOLN  COMPARISON:  09/23/2012  FINDINGS: Stable small hypodense thyroid lesions. Dual lead pacer noted. At the level of the pulmonary artery on image 57 of series 4, the ascending aortic transverse caliber is 4.9 cm, no change from 09/23/2012. Central arch 3.2 cm on image 28 of series 4 (stable) and proximal descending thoracic aorta 3.1 cm (likewise stable) if scattered atherosclerotic calcification involving the ascending aorta, aortic arch, descending thoracic aorta, and branch vasculature noted. Mild cardiomegaly is again noted. No pathologic thoracic adenopathy or pericardial effusion.  There is a slightly greater amount of right eccentric mural thrombus in the descending thoracic aorta adjacent to the aortic hiatus on image 89 of series 4. The celiac trunk and nodes prompt proximal branches appear patent. An exophytic right kidney upper pole lesion is likely a cyst, measuring 13 Hounsfield units.  Stable mild right lower lobe scarring. Thoracic kyphosis and thoracic spondylosis noted.  Review of the MIP images confirms the above findings.  IMPRESSION: 1. No change in size or appearance of ascending aortic aneurysm (which measures up to 4.9 cm) or descending thoracic aortic ectasia. 2. Thoracic kyphosis and spondylosis.   Electronically Signed   By: WSherryl BartersM.D.   On: 11/04/2013  09:10   Ct Angio Chest W/cm &/or Wo Cm  09/23/2012  *RADIOLOGY REPORT*  Clinical Data: Follow-up ascending aortic aneurysm.  CT ANGIOGRAPHY CHEST  Technique:  Multidetector CT imaging of the chest using the standard protocol during bolus administration of intravenous contrast. Multiplanar reconstructed images including MIPs were obtained and reviewed to evaluate the vascular anatomy.  Contrast: 880mOMNIPAQUE IOHEXOL 350 MG/ML SOLN  Comparison: 01/28/2012.  Findings: Low attenuation lesions in the thyroid measure up to 1.4 cm on the right, as before.  Ascending aorta measures up to 4.9 cm, unchanged from the prior examination.  Transverse aorta measures up to 3.2 cm and proximal descending thoracic aorta, 3.1 cm, also unchanged.  There is atherosclerotic irregularity and calcification of the aorta with calcification seen in the coronary arteries. Heart is mildly enlarged.  No pericardial effusion.  No pathologically enlarged mediastinal, hilar or axillary lymph nodes.  Scattered scarring in the right lower lobe and along the left major fissure.  No pleural fluid.  Airway is unremarkable.  Incidental imaging of the upper abdomen shows no acute findings. No worrisome lytic or sclerotic lesions.  IMPRESSION:  1.  Stable ascending aortic aneurysm. Transverse and descending thoracic aortic diameters are stable as well. 2.  Coronary artery calcification.   Original Report Authenticated By: MeLorin PicketM .     ECHO: most recent echo 2013  Transthoracic Echocardiography  Patient: WiMikaia, JanvierR #: [id number] Date: 08/07/2011 Gender: F Age: [age]: 167.6cm Weight: 78.5kg BSA: 1.8853mPt. Status: Room:  SONOGRAPHER ParCharlann NossTENDING DalLoralie ChampagneD PERFORMING [facility] ORDERING KleVirl AxeFCarney Living: Dr. JamJani Gravel----------------------------------------------------------- LV EF: 55% -  60%  ------------------------------------------------------------ Indications: Atrial fibrillation - 427.31.  ------------------------------------------------------------ History: PMH: Acquired from the patient and from the patient's chart. Atrial fibrillation. Risk factors: Hypertension. Dyslipidemia.  ------------------------------------------------------------ Study Conclusions  -  Left ventricle: The cavity size was normal. Wall thickness was normal. Systolic function was normal. The estimated ejection fraction was 55%. Although no diagnostic regional wall motion abnormality was identified, this possibility cannot be completely excluded on the basis of this study. Features are consistent with a pseudonormal left ventricular filling pattern, with concomitant abnormal relaxation and increased filling pressure (grade 2 diastolic dysfunction). - Aortic valve: There was no stenosis. - Aorta: Normal aortic root size. Ascending aortic aneurysm reaching 4.8 cm. - Mitral valve: Mild regurgitation. - Left atrium: The atrium was moderately dilated. - Right ventricle: The cavity size was normal. Pacer wire or catheter noted in right ventricle. Systolic function was normal. - Right atrium: The atrium was mildly dilated. - Tricuspid valve: Peak RV-RA gradient:38m Hg (S). - Pulmonary arteries: PA systolic pressure 216-96mmHg. - Systemic veins: IVC measured 2.0 cm with normal respirophasic variation, suggesting RA pressure 6-10 mmHg. - Pericardium, extracardiac: A trivial pericardial effusion was identified. Impressions:  - Normal LV size and systolic function, EF 578% Moderate diastolic dysfunction. Biatrial enlargement. Normal RV size and systolic function. Mild mitral regurgitation. Ascending aortic aneurysm reaching 4.8 cm by echo. Would consider MRA chest to fully assess thoracic aorta. Transthoracic echocardiography. M-mode, complete 2D, spectral Doppler, and color Doppler.  Height: Height: 167.6cm. Height: 66in. Weight: Weight: 78.5kg. Weight: 172.6lb. Body mass index: BMI: 27.9kg/m^2. Body surface area: BSA: 1.852m. Blood pressure: 164/83. Patient status: Outpatient. Location: [facility]  ------------------------------------------------------------  ------------------------------------------------------------ Left ventricle: The cavity size was normal. Wall thickness was normal. Systolic function was normal. The estimated ejection fraction was 55%. Although no diagnostic regional wall motion abnormality was identified, this possibility cannot be completely excluded on the basis of this study. Features are consistent with a pseudonormal left ventricular filling pattern, with concomitant abnormal relaxation and increased filling pressure (grade 2 diastolic dysfunction).  ------------------------------------------------------------ Aortic valve: Trileaflet; mildly calcified leaflets. Doppler: There was no stenosis. No regurgitation.  ------------------------------------------------------------ Aorta: Normal aortic root size. Ascending aortic aneurysm reaching 4.8 cm.  ------------------------------------------------------------ Mitral valve: Doppler: There was no evidence for stenosis. Mild regurgitation.  ------------------------------------------------------------ Left atrium: The atrium was moderately dilated.  ------------------------------------------------------------ Right ventricle: The cavity size was normal. Pacer wire or catheter noted in right ventricle. Systolic function was normal.  ------------------------------------------------------------ Pulmonic valve: Structurally normal valve. Cusp separation was normal. Doppler: Transvalvular velocity was within the normal range. No regurgitation.  ------------------------------------------------------------ Tricuspid valve: Doppler: Trivial  regurgitation.  ------------------------------------------------------------ Pulmonary artery: PA systolic pressure 2793-81mHg.  ------------------------------------------------------------ Right atrium: The atrium was mildly dilated.  ------------------------------------------------------------ Pericardium: A trivial pericardial effusion was identified.  ------------------------------------------------------------ Systemic veins: IVC measured 2.0 cm with normal respirophasic variation, suggesting RA pressure 6-10 mmHg.  ------------------------------------------------------------ Post procedure conclusions Ascending Aorta:  - Normal aortic root size. Ascending aortic aneurysm reaching 4.8 cm.  ------------------------------------------------------------  2D measurements Normal Doppler measurements Norma Left ventricle l LVID ED, 44.3 mm 43-52 Left ventricle chord, Ea, lat 8.6 cm/s ----- PLAX ann, tiss 6 LVID ES, 30.9 mm 23-38 DP chord, E/Ea, lat 6.6 ----- PLAX ann, tiss 7 FS, chord, 30 % >29 DP PLAX Ea, med 4.9 cm/s ----- LVPW, ED 8.92 mm ------ ann, tiss 3 IVS/LVPW 0.88 <1.3 DP ratio, ED E/Ea, med 11. ----- Ventricular septum ann, tiss 72 IVS, ED 7.85 mm ------ DP LVOT LVOT Diam, S 21 mm ------ Peak vel, 103 cm/s ----- Area 3.46 cm^2 ------ S Diam 21 mm ------ VTI, S 25. cm ----- Aorta 8 Root diam, 32 mm ------ HR 60  bpm ----- ED Stroke 89. ml ----- Left atrium vol 4 AP dim 2.23 cm/m^2 <2.2 Cardiac 5.4 L/min ----- index output Cardiac 2.9 L/(min-m ----- index ^2) Stroke 47. ml/m^2 ----- index 5 Mitral valve Peak E 57. cm/s ----- vel 8 Peak A 54. cm/s ----- vel 8 Decelerat 391 ms 150-2 ion time 30 Peak E/A 1.1 ----- ratio Tricuspid valve Peak 19 mm Hg ----- RV-RA gradient, S  ------------------------------------------------------------ Prepared and Electronically Authenticated by  Loralie Champagne, MD 2013-01-23T20:42:[id number]   Followup CT scan  of the chest done July 2013 Ct Angio Chest Aorta W/cm &/or Wo/cm  10/20/2014   CLINICAL DATA:  Thoracic aortic aneurysm.  EXAM: CT ANGIOGRAPHY CHEST WITH CONTRAST  TECHNIQUE: Multidetector CT imaging of the chest was performed using the standard protocol during bolus administration of intravenous contrast. Multiplanar CT image reconstructions and MIPs were obtained to evaluate the vascular anatomy.  CONTRAST:  75 mL of Isovue 370 intravenously.  COMPARISON:  CT scan of November 04, 2013.  FINDINGS: No pneumothorax or pleural effusion is noted. No acute pulmonary disease is noted. Ascending thoracic aorta has maximum measured diameter of 4.9 cm which is not significantly changed compared to prior exam. Transverse aortic arch measures 3.1 cm which is not significantly changed compared to prior exam. Proximal portion of descending thoracic aorta measures 3.1 cm which is not significantly changed compared to prior exam. No dissection is noted. Atherosclerotic calcifications are again noted. No mediastinal mass or adenopathy is noted. Left-sided pacemaker is unchanged in position. Pulmonary arteries appear grossly normal. Stable mild cardiomegaly is noted. Stable exophytic cyst is seen arising from upper pole of right kidney.  Review of the MIP images confirms the above findings.  IMPRESSION: Stable ascending thoracic aortic aneurysm with maximum measured diameter 4.9 cm. No significant changes noted compared to prior exam.   Electronically Signed   By: Marijo Conception, M.D.   On: 10/20/2014 13:17    Ct Angio Chest W/cm &/or Wo Cm  01/28/2012  *RADIOLOGY REPORT*  Clinical Data: Follow-up aortic aneurysm.  CT ANGIOGRAPHY CHEST  Technique:  Multidetector CT imaging of the chest using the standard protocol during bolus administration of intravenous contrast. Multiplanar reconstructed images including MIPs were obtained and reviewed to evaluate the vascular anatomy.  Contrast: 158m OMNIPAQUE IOHEXOL 300 MG/ML  SOLN   Comparison: 08/13/2011  Findings: Again noted is the mild aneurysmal dilatation of the ascending aorta, currently measuring maximally 4.7 cm compared 5.0 cm previously.  No dissection or significant change.  Proximal descending thoracic aorta measures maximally 3.1 cm.  Descending thoracic aorta at the aortic hiatus measures 2.8 cm.  Distal thoracic aortic calcifications noted.  Left-sided pacer remains in place, unchanged.  Coronary artery calcifications present. No mediastinal, hilar, or axillary adenopathy.  Linear scarring in the lung bases.  Lungs otherwise clear.  No pleural effusions.  No suspicious pulmonary nodules or masses.  Visualized chest wall soft tissues are unremarkable.  Imaging into the upper abdomen demonstrates no acute findings.  No acute bony abnormality.  Mild degenerative changes and kyphosis in the thoracic spine.  IMPRESSION: Stable ascending aortic aneurysm, measuring maximally 4.7 cm.  Coronary artery disease.  Original Report Authenticated By: KRaelyn Number M.D.    Recent Lab Findings: Lab Results  Component Value Date   WBC 4.7 09/04/2015   HGB 12.0 09/04/2015   HCT 36.7 09/04/2015   PLT 215 09/04/2015   GLUCOSE 117* 09/04/2015   CHOL 181 03/27/2015   TRIG 96 03/27/2015  HDL 64 03/27/2015   LDLCALC 98 03/27/2015   ALT 17 07/27/2015   AST 25 07/27/2015   NA 142 09/04/2015   K 4.2 09/04/2015   CL 108 09/04/2015   CREATININE 1.03* 09/04/2015   BUN 18 09/04/2015   CO2 25 09/04/2015   TSH 2.066 07/27/2015   INR 1.06 04/06/2015   HGBA1C 5.5 03/27/2015      Assessment / Plan:  1/  ascending aortic aneurysm (which measures up to 4.9 cm)  with no change by CTA over the past year          Aortic Size Index=      4.9 /Body surface area is 1.88 meters squared. = 2.5 index                       < 2.75 cm/m2      4% risk per year               2.75 to 4.25          8% risk per year              > 4.25 cm/m2    20% risk per year  2/descending thoracic aortic  ectasia 3/Thoracic kyphosis and spondylosis 4/ history of afib on coumadin  I have reviewed the patient'sCT scan, she does have a dilated ascending aorta approximately 4.9 cm maximum diameter without evidence of aortic valve disease. There has been no change over a since 2013  . At this point with her age of 15 years size less then 5.5 cm and index 2.47  I would not recommend elective repair of her ascending aorta as her yearly risk from aortic  rupture is 4%. I've discussed with her the risks of dissection and consequences of that versus the risk of elective repair. She understands a good blood pressure control including beta blockers important. We'll plan to see her back in approximately 8 months with a followup CTA of the aorta to evaluate any change in size.     Grace Isaac MD  Beeper [phone] Office [phone] 11/02/2015 6:24 PM

## 2015-11-02 ENCOUNTER — Ambulatory Visit: Payer: Medicare Other | Admitting: Cardiothoracic Surgery

## 2015-11-13 ENCOUNTER — Other Ambulatory Visit: Payer: Self-pay | Admitting: Internal Medicine

## 2015-11-23 ENCOUNTER — Encounter (HOSPITAL_BASED_OUTPATIENT_CLINIC_OR_DEPARTMENT_OTHER): Payer: Medicare Other | Attending: Internal Medicine

## 2015-11-23 DIAGNOSIS — M160 Bilateral primary osteoarthritis of hip: Secondary | ICD-10-CM | POA: Diagnosis not present

## 2015-11-23 DIAGNOSIS — I4891 Unspecified atrial fibrillation: Secondary | ICD-10-CM | POA: Insufficient documentation

## 2015-11-23 DIAGNOSIS — S81811A Laceration without foreign body, right lower leg, initial encounter: Secondary | ICD-10-CM | POA: Diagnosis present

## 2015-11-23 DIAGNOSIS — L03115 Cellulitis of right lower limb: Secondary | ICD-10-CM | POA: Diagnosis not present

## 2015-11-23 DIAGNOSIS — I10 Essential (primary) hypertension: Secondary | ICD-10-CM | POA: Insufficient documentation

## 2015-11-23 DIAGNOSIS — Z87891 Personal history of nicotine dependence: Secondary | ICD-10-CM | POA: Insufficient documentation

## 2015-11-23 DIAGNOSIS — Z8673 Personal history of transient ischemic attack (TIA), and cerebral infarction without residual deficits: Secondary | ICD-10-CM | POA: Diagnosis not present

## 2015-11-23 DIAGNOSIS — M170 Bilateral primary osteoarthritis of knee: Secondary | ICD-10-CM | POA: Diagnosis not present

## 2015-11-23 DIAGNOSIS — W228XXA Striking against or struck by other objects, initial encounter: Secondary | ICD-10-CM | POA: Insufficient documentation

## 2015-11-28 ENCOUNTER — Other Ambulatory Visit: Payer: Self-pay | Admitting: Internal Medicine

## 2015-11-30 DIAGNOSIS — S81811A Laceration without foreign body, right lower leg, initial encounter: Secondary | ICD-10-CM | POA: Diagnosis not present

## 2015-12-07 DIAGNOSIS — S81811A Laceration without foreign body, right lower leg, initial encounter: Secondary | ICD-10-CM | POA: Diagnosis not present

## 2015-12-14 ENCOUNTER — Encounter (HOSPITAL_BASED_OUTPATIENT_CLINIC_OR_DEPARTMENT_OTHER): Payer: Medicare Other | Attending: Internal Medicine

## 2015-12-14 DIAGNOSIS — I10 Essential (primary) hypertension: Secondary | ICD-10-CM | POA: Insufficient documentation

## 2015-12-14 DIAGNOSIS — W228XXA Striking against or struck by other objects, initial encounter: Secondary | ICD-10-CM | POA: Diagnosis not present

## 2015-12-14 DIAGNOSIS — M1990 Unspecified osteoarthritis, unspecified site: Secondary | ICD-10-CM | POA: Diagnosis not present

## 2015-12-14 DIAGNOSIS — S81811A Laceration without foreign body, right lower leg, initial encounter: Secondary | ICD-10-CM | POA: Insufficient documentation

## 2015-12-21 DIAGNOSIS — S81811A Laceration without foreign body, right lower leg, initial encounter: Secondary | ICD-10-CM | POA: Diagnosis not present

## 2016-01-08 ENCOUNTER — Telehealth: Payer: Self-pay | Admitting: Cardiology

## 2016-01-08 ENCOUNTER — Ambulatory Visit (INDEPENDENT_AMBULATORY_CARE_PROVIDER_SITE_OTHER): Payer: Medicare Other | Admitting: *Deleted

## 2016-01-08 DIAGNOSIS — I495 Sick sinus syndrome: Secondary | ICD-10-CM

## 2016-01-08 NOTE — Telephone Encounter (Signed)
LMOVM reminding pt to send remote transmission.   

## 2016-01-08 NOTE — Progress Notes (Signed)
Remote pacemaker transmission.   

## 2016-01-09 LAB — CUP PACEART REMOTE DEVICE CHECK
Battery Impedance: 260 Ohm
Battery Remaining Longevity: 110 mo
Battery Voltage: 2.78 V
Brady Statistic AP VP Percent: 0 %
Brady Statistic AP VS Percent: 70 %
Brady Statistic AS VP Percent: 2 %
Brady Statistic AS VS Percent: 28 %
Date Time Interrogation Session: 20170626170815
Implantable Lead Implant Date: 20130114
Implantable Lead Implant Date: 20130114
Implantable Lead Location: 753859
Implantable Lead Location: 753860
Implantable Lead Model: 1948
Implantable Lead Model: 5076
Lead Channel Impedance Value: 429 Ohm
Lead Channel Impedance Value: 699 Ohm
Lead Channel Pacing Threshold Amplitude: 0.5 V
Lead Channel Pacing Threshold Amplitude: 0.625 V
Lead Channel Pacing Threshold Pulse Width: 0.4 ms
Lead Channel Pacing Threshold Pulse Width: 0.4 ms
Lead Channel Sensing Intrinsic Amplitude: 16 mV
Lead Channel Sensing Intrinsic Amplitude: 2.8 mV
Lead Channel Setting Pacing Amplitude: 2 V
Lead Channel Setting Pacing Amplitude: 2.5 V
Lead Channel Setting Pacing Pulse Width: 0.4 ms
Lead Channel Setting Sensing Sensitivity: 5.6 mV

## 2016-01-10 ENCOUNTER — Encounter: Payer: Self-pay | Admitting: Cardiology

## 2016-01-14 ENCOUNTER — Encounter (HOSPITAL_COMMUNITY): Payer: Self-pay | Admitting: *Deleted

## 2016-01-14 ENCOUNTER — Ambulatory Visit (HOSPITAL_COMMUNITY)
Admission: EM | Admit: 2016-01-14 | Discharge: 2016-01-14 | Disposition: A | Discharge status: Home / Self Care | Payer: Medicare Other | Attending: Family Medicine | Admitting: Family Medicine

## 2016-01-14 DIAGNOSIS — N390 Urinary tract infection, site not specified: Secondary | ICD-10-CM

## 2016-01-14 LAB — POCT URINALYSIS DIP (DEVICE)
BILIRUBIN URINE: NEGATIVE
GLUCOSE, UA: NEGATIVE mg/dL
HGB URINE DIPSTICK: NEGATIVE
Ketones, ur: NEGATIVE mg/dL
NITRITE: NEGATIVE
Protein, ur: NEGATIVE mg/dL
Specific Gravity, Urine: 1.02 (ref 1.005–1.030)
UROBILINOGEN UA: 0.2 mg/dL (ref 0.0–1.0)
pH: 5 (ref 5.0–8.0)

## 2016-01-14 MED ORDER — CIPROFLOXACIN HCL 250 MG PO TABS: 250.0000 mg | ORAL_TABLET | Freq: Two times a day (BID) | ORAL | Status: DC

## 2016-01-14 NOTE — ED Provider Notes (Signed)
CSN: [id number]     Arrival date & time 01/14/16  1218 History   First MD Initiated Contact with Patient 01/14/16 1320     Chief Complaint  Patient presents with  . Urinary Tract Infection   HPI [name] [name] is a 82 y.o. female presenting for symptoms of urinary tract infection.   She reports 2 days of moderate, unchanged intermittent burning toward the end of urination and some increase frequency of urination. This is typical of prior episodes of UTI, all treated, last 3 months ago. She denies fevers, abd pain, N/V/D, vaginal discharge or bleeding, hematuria, weight loss.   Past Medical History  Diagnosis Date  . Persistent atrial fibrillation ([place])   . sinus node dysfunction//post termination pauses   . HTN (hypertension)   . CAD (coronary artery disease)     LHC 5/08:  pOM 20%, pRCA 20-30%, EF 60%  . NASH (nonalcoholic steatohepatitis)   . PMR (polymyalgia rheumatica) (HCC)     no steriods for 5 years  . HLD (hyperlipidemia)   . Neuromuscular disorder ([place])   . Hepatitis     hx of medication induced hepatitis (Vytorin per pt)  . Ascending aorta enlargement (HCC)     4.8 cm per echo 08/07/11; 5.0 by CT in Jan 2013  . Pacemaker-Medtronic 11/12/2011    Implanted 2013   . Hx of echocardiogram     echo 1/13: EF 55%, Gr 2 diast dysfn, Asc Ao aneurysm 4.8 cm, mild MR, mod LAE, mild RAE  . ICH (intracerebral hemorrhage) ([place]) 03/24/15  . Stroke ([place])   . History of loop recorder    Past Surgical History  Procedure Laterality Date  . Umbilical hernia repair  1950's  . Incision and drainage breast abscess  1973  . Cardiac catheterization    . Hernia repair    . Pacemaker insertion  Jan 2013  . Cardioversion  03/02/2012    Procedure: CARDIOVERSION;  Surgeon: Lelon Perla, MD;  Location: [place];  Service: Cardiovascular;  Laterality: N/A;  . Cardioversion  03/13/2012    Procedure: CARDIOVERSION;  Surgeon: Josue Hector, MD;  Location: [facility] ENDOSCOPY;  Service: Cardiovascular;   Laterality: N/A;  . Cardioversion  04/15/2012    Procedure: CARDIOVERSION;  Surgeon: [name], MD;  Location: [facility] ENDOSCOPY;  Service: Cardiovascular;  Laterality: N/A;  . Cardioversion N/A 06/07/2014    Procedure: CARDIOVERSION;  Surgeon: [name], MD;  Location: [place];  Service: Cardiovascular;  Laterality: N/A;  . Permanent pacemaker insertion N/A 07/29/2011    Procedure: PERMANENT PACEMAKER INSERTION;  Surgeon: [name], MD;  Location: [facility] CATH LAB;  Service: Cardiovascular;  Laterality: N/A;  . Cardioversion N/A 08/18/2014    Procedure: CARDIOVERSION;  Surgeon: [name], MD;  Location: [place];  Service: Cardiovascular;  Laterality: N/A;  . Cardioversion N/A 09/06/2015    Procedure: CARDIOVERSION;  Surgeon: Jerline Pain, MD;  Location: [facility] ENDOSCOPY;  Service: Cardiovascular;  Laterality: N/A;   Family History  Problem Relation Age of Onset  . Coronary artery disease    . Alzheimer's disease     Social History  Substance Use Topics  . Smoking status: Former Smoker    Types: Cigarettes    Quit date: 07/16/1990  . Smokeless tobacco: Never Used  . Alcohol Use: No   OB History    No data available     Review of Systems: as above  Allergies  Penicillins; Statins; Tylenol; Tape; and Codeine  Home Medications  Prior to Admission medications   Medication Sig Start Date End Date Taking? Authorizing Provider  amiodarone (PACERONE) 200 MG tablet TAKE ONE (1) TABLET BY MOUTH EVERY DAY 11/14/15  Yes [name], MD  apixaban (ELIQUIS) 5 MG TABS tablet Take 1 tablet (5 mg total) by mouth 2 (two) times daily. 10/24/15  Yes Rosalin Hawking, MD  Calcium Carbonate-Vitamin D (CALTRATE 600+D PO) Take 1 tablet by mouth 2 (two) times daily.   Yes Historical Provider, MD  cetirizine (ZYRTEC) 10 MG tablet Take 10 mg by mouth daily as needed for allergies.    Yes Historical Provider, MD  Cholecalciferol (VITAMIN D3) 1000 UNITS tablet Take 1,000 Units by mouth at  bedtime.    Yes Historical Provider, MD  diltiazem (CARDIZEM CD) 120 MG 24 hr capsule Take 1 capsule (120 mg total) by mouth daily. 10/09/15  Yes [name], MD  furosemide (LASIX) 20 MG tablet Take 1 tablet (20 mg total) by mouth daily. 11/29/15  Yes [name], MD  labetalol (NORMODYNE) 300 MG tablet Take 1 tablet (300 mg total) by mouth 2 (two) times daily. 07/13/15  Yes [name], MD  multivitamin [facility]) per tablet Take 1 tablet by mouth at bedtime.    Yes Historical Provider, MD  potassium chloride (K-DUR) 10 MEQ tablet Take 1 tablet (10 mEq total) by mouth 2 (two) times daily. 10/09/15  Yes [name], MD  traMADol (ULTRAM) 50 MG tablet Take 50 mg by mouth daily as needed (for headache).    Yes Historical Provider, MD  valsartan (DIOVAN) 320 MG tablet TAKE 1 TABLET BY MOUTH DAILY 10/25/15  Yes [name], MD  [facility] 625 MG tablet Take 625-1,250 mg by mouth 2 (two) times daily. Take 625 mg every morning and 1250 mg at bedtime. 05/08/12  Yes Historical Provider, MD  ALPRAZolam Duanne Moron) 0.25 MG tablet Take 0.25 mg by mouth daily as needed for anxiety. Takes as needed 04/05/15   Historical Provider, MD  ciprofloxacin (CIPRO) 250 MG tablet Take 1 tablet (250 mg total) by mouth 2 (two) times daily. 01/14/16   Patrecia Pour, MD   Meds Ordered and Administered this Visit  Medications - No data to display  BP 121/68 mmHg  Pulse 83  Temp(Src) 97.6 F (36.4 C) (Oral)  Resp 18  SpO2 97% No data found.  Physical Exam  Constitutional: She is oriented to person, place, and time. She appears well-developed and well-nourished. No distress.  Eyes: EOM are normal. Pupils are equal, round, and reactive to light. No scleral icterus.  Neck: Neck supple. No JVD present.  Cardiovascular: Normal rate, regular rhythm, normal heart sounds and intact distal pulses.   No murmur heard. Pulmonary/Chest: Effort normal and breath sounds normal. No respiratory distress.  Abdominal: Soft. Bowel  sounds are normal. She exhibits no distension. There is no tenderness.  Musculoskeletal: Normal range of motion. She exhibits no edema or tenderness.  Lymphadenopathy:    She has no cervical adenopathy.  Neurological: She is alert and oriented to person, place, and time. She exhibits normal muscle tone.  Skin: Skin is warm and dry.  Vitals reviewed.   ED Course  Procedures (including critical care time)  Labs Review Labs Reviewed  POCT URINALYSIS DIP (DEVICE) - Abnormal; Notable for the following:    Leukocytes, UA SMALL (*)    All other components within normal limits    Imaging Review No results found.  MDM   1. UTI (lower urinary tract  infection)     82 y.o. female with history of UTI and symptoms characteristic of those episodes with small leukocytes on urine dipstick. Since she is leaving town tomorrow, we will start cipro po (has tolerated this and has PCN allergy) and send to culture. Will call to discontinue abx if culture negative.   Patrecia Pour, MD 01/14/16 1357

## 2016-01-14 NOTE — ED Notes (Signed)
C/O slight dysuria and polyuria 2 nights ago with progressive worsening.  Denies flank or back pain.  Denies fevers.  Recently had UTI 3 mo ago.

## 2016-01-14 NOTE — Discharge Instructions (Signed)
Take ciprofloxacin 224m twice daily for 7 days. We will call you if the results of your culture are negative, so you can stop the antibiotic. If you develop fever, abdominal pain, or worsening symptoms please seek medical care.

## 2016-01-18 ENCOUNTER — Telehealth (HOSPITAL_COMMUNITY): Payer: Self-pay | Admitting: Emergency Medicine

## 2016-01-18 MED ORDER — SULFAMETHOXAZOLE-TRIMETHOPRIM 800-160 MG PO TABS: ORAL_TABLET | ORAL | Status: DC

## 2016-01-18 NOTE — ED Notes (Signed)
Per Youlanda Roys, NP (verbal order) Pt is to take 0.5 tab of septra ds #3 w/no refills Per pt's request... E-rx to [facility]) Adv pt if not feeling any better, to return

## 2016-01-18 NOTE — ED Notes (Signed)
Pt called letting us know that the Antibiotic given on 7/2 was making her have diarrhea Notified Youlanda Roys, NP since Dr. Bonner Puna is not here today Also adv him that the urine culture was cancelled in [facility] Will await further instructions from Janne Napoleon, NP

## 2016-01-22 ENCOUNTER — Telehealth: Payer: Self-pay | Admitting: Internal Medicine

## 2016-01-22 NOTE — Telephone Encounter (Signed)
Follow Up:   She says she is returning Dr Caryl Comes call from last week.

## 2016-01-22 NOTE — Telephone Encounter (Signed)
LM TO CALL BACK ./CY 

## 2016-01-22 NOTE — Telephone Encounter (Signed)
[name] [name] [facility]  Implantable device - remote  Order# [id number]   Ordering physician: [name], MD  Study date: 01/09/16      Result Notes    Notes Recorded by [name], MD on 01/17/2016 at 1:39 PM Remote reviewed. This remote is abnormal for persistent afib since mid may on amio Will arrange for followup with DC AFib clinic to discuss symptom and either repeat DCCV or d/c of amio Tried to call both numbers today and got no answer--left VM at home      Indications    Sinus node dysfunction ([place]) [I49.5 (ICD-10-CM)]    Conclusion    Remote reviewed. 48.4% AF burden--persistent since 11/26/15 + Eliquis/Amio.  Next follow up via [facility] on 9/25.Memory Dance    Result Report    Battery Impedance: 260 ohm     Battery Remaining Longevity: 110 mo       Battery Status: Unknown      Battery Voltage: 2.78 V       Brady Statistic AP VP Percent: 0 %     Brady Statistic AP VS Percent: 70 %       Brady Statistic AS VP Percent: 2 %     Brady Statistic AS VS Percent: 28 %       Date Time Interrogation Session: [id number]      Eval Rhythm: AF/Vs/Vp        Implantable Lead Implant Date: [id number]      Implantable Lead Implant Date: [id number]        Implantable Lead Location: 175102      Implantable Lead Location: 585277        Implantable Lead Manufacturer: MERM      Implantable Lead Manufacturer: SJCR        Implantable Lead Model: 5076 CapSureFix Novus      Implantable Lead Model: 1948        Implantable Lead Serial Number: [id number]      Implantable Lead Serial Number: [id number]        Implantable Pulse Generator Implant Date: [id number]      Implantable Pulse Generator Type: Implantable Pulse Generator        Lead Channel Impedance Value: 429 ohm     Lead Channel Impedance Value: 699 ohm       Lead Channel Pacing Threshold Amplitude: 0.5 V     Lead Channel Pacing Threshold Amplitude: 0.625 V        Lead Channel Pacing Threshold Pulse Width: 0.4 ms     Lead Channel Pacing Threshold Pulse Width: 0.4 ms       Lead Channel Sensing Intrinsic Amplitude: 2.8 mV     Lead Channel Sensing Intrinsic Amplitude: 16.0 mV       Lead Channel Setting Pacing Amplitude: 2.000 V     Lead Channel Setting Pacing Amplitude: 2.500 V       Lead Channel Setting Pacing Pulse Width: 0.40 ms     Lead Channel Setting Sensing Sensitivity: 5.60 mV       Pulse Gen Model: ADDRL1 Adapta      Pulse Gen Serial Number: [id number]        Pulse Generator Manufacturer: MERM          Order-Level Documents - 01/08/2016:      Scan on 01/09/2016 10:19 AM by Provider Default, MDScan on 01/09/2016 10:19 AM by Provider Default, MD     Document on 01/09/2016 10:19 AM by Shiela Mayer, CMA : carelink ppm Charlett Nose  [email or web address]    Encounter-Level Documents:    There are no encounter-level documents.    PACS Images    Show images for Implantable device - remote     Link to Procedure Log    Procedure Log      Signed    Electronically signed on 01/09/16 at 1019 EDT    Implantable device - remote (Order [id number])  Electrophysiology  Order: [id number]   Ordering: [employer] Interface  Authorizing: [name], MD   Date: 01/08/2016  Department: [facility]       Result Notes     Notes Recorded by [name], MD on 01/17/2016 at 1:39 PM Remote reviewed. This remote is abnormal for persistent afib since mid may on amio Will arrange for followup with DC AFib clinic to discuss symptom and either repeat DCCV or d/c of amio Tried to call both numbers today and got no answer--left VM at home     Order Information     Order Date/Time Release Date/Time Start Date/Time End Date/Time    01/08/16 05:08 PM None 01/08/16 05:08 PM None      Order Details     Frequency Duration Priority Order Class    None None Routine Normal      Acc#     [id number]      Associated Diagnoses       ICD-9-CM ICD-10-CM    Sinus node dysfunction [facility]) - Primary    427.81 I49.5      Authorizing Provider Audit Trail     Date/Time Authorizing Provider Changed by    01/09/2016 10:19 AM [name], MD [employer] Interface      Order Requisition     CUP_MDC_IDC_ENUM_SESS_TYPE_REMOTE (Order [id number]) on 01/08/16     Collection Information     Collected: 01/08/2016 5:08 PM   Resulting Agency: [facility]       Order-Level Documents:       Scan on 01/09/2016 10:19 AM by Provider Default, MDScan on 01/09/2016 10:19 AM by Provider Default, MD     View [place]     CUP_MDC_IDC_ENUM_SESS_TYPE_REMOTE (Order #[id number]) on 01/08/16

## 2016-01-23 NOTE — Telephone Encounter (Signed)
This has been forwarded to [facility] Clinic Butch Penny, Lucie Leather) and Marsh Dolly

## 2016-01-23 NOTE — Telephone Encounter (Signed)
The patient has an appointment 7/13 with Roderic Palau, NP.

## 2016-01-25 ENCOUNTER — Other Ambulatory Visit: Payer: Self-pay

## 2016-01-25 ENCOUNTER — Encounter (HOSPITAL_COMMUNITY): Payer: Self-pay | Admitting: Nurse Practitioner

## 2016-01-25 ENCOUNTER — Other Ambulatory Visit (HOSPITAL_COMMUNITY): Payer: Self-pay | Admitting: Nurse Practitioner

## 2016-01-25 ENCOUNTER — Ambulatory Visit (HOSPITAL_COMMUNITY)
Admission: RE | Admit: 2016-01-25 | Discharge: 2016-01-25 | Disposition: A | Discharge status: Home / Self Care | Payer: Medicare Other | Source: Ambulatory Visit | Attending: Nurse Practitioner | Admitting: Nurse Practitioner

## 2016-01-25 VITALS — BP 146/84 | HR 84 | Ht 66.0 in | Wt 169.0 lb

## 2016-01-25 DIAGNOSIS — I480 Paroxysmal atrial fibrillation: Secondary | ICD-10-CM | POA: Insufficient documentation

## 2016-01-25 DIAGNOSIS — I481 Persistent atrial fibrillation: Secondary | ICD-10-CM

## 2016-01-25 DIAGNOSIS — I4819 Other persistent atrial fibrillation: Secondary | ICD-10-CM

## 2016-01-25 LAB — COMPREHENSIVE METABOLIC PANEL
ALT: 53 U/L (ref 14–54)
AST: 66 U/L — AB (ref 15–41)
Albumin: 3 g/dL — ABNORMAL LOW (ref 3.5–5.0)
Alkaline Phosphatase: 75 U/L (ref 38–126)
Anion gap: 7 (ref 5–15)
BUN: 10 mg/dL (ref 6–20)
CHLORIDE: 107 mmol/L (ref 101–111)
CO2: 27 mmol/L (ref 22–32)
CREATININE: 0.86 mg/dL (ref 0.44–1.00)
Calcium: 9.4 mg/dL (ref 8.9–10.3)
GFR calc Af Amer: >60 mL/min (ref >60)
GFR calc non Af Amer: >60 mL/min (ref >60)
GLUCOSE: 105 mg/dL — AB (ref 65–99)
Potassium: 3.9 mmol/L (ref 3.5–5.1)
SODIUM: 141 mmol/L (ref 135–145)
Total Bilirubin: 1 mg/dL (ref 0.3–1.2)
Total Protein: 5.8 g/dL — ABNORMAL LOW (ref 6.5–8.1)

## 2016-01-25 LAB — CBC
HCT: 35.9 % — ABNORMAL LOW (ref 36.0–46.0)
Hemoglobin: 11.3 g/dL — ABNORMAL LOW (ref 12.0–15.0)
MCH: 28.5 pg (ref 26.0–34.0)
MCHC: 31.5 g/dL (ref 30.0–36.0)
MCV: 90.4 fL (ref 78.0–100.0)
PLATELETS: 235 10*3/uL (ref 150–400)
RBC: 3.97 MIL/uL (ref 3.87–5.11)
RDW: 14.2 % (ref 11.5–15.5)
WBC: 4.8 10*3/uL (ref 4.0–10.5)

## 2016-01-25 MED ORDER — AMIODARONE HCL 200 MG PO TABS: 200.0000 mg | ORAL_TABLET | Freq: Two times a day (BID) | ORAL | Status: DC

## 2016-01-25 NOTE — Progress Notes (Signed)
Patient ID: [name] [name], female   DOB: 1930-08-05, 82 y.o.   MRN: [id number]     Primary Care Physician: [name], MD Referring Physician: Dr. Idell Pickles [name] [name] is a 82 y.o. female with a h/o a-fib previously  on coumadin s/p pacemaker, HTN, CAD, HLD admitted on 03/25/15 for L parietal ICH. INR 2.35, reversed with Kcentra. At that time pt was placed on eliquis with no further neurological sequelea. Received word from Dr. Caryl Comes that pt needed to be seen for persistent afib since May of this year by remote report and confirmed by interrogation this am. She has not felt the palpitations and was therefore surprised that she had been in afib, although husband has noticed that she has slowed down recently.Has been treated with flecainide and Rythmol in the past.  Per Dr. Olin Pia recommendation, reasonable to try another cardioversion or discuss stopping drug and being rate controlled. V. Rates have been between 70-90 by interrogation. She did have successful cardioversion  2/17. Pt wishes to have DCCV. She has not missed any doses of eliquis. She just finished treatment for UTI. Otherwise, she knows of no change recently in physical condition.  Today, she denies symptoms of palpitations, chest pain, shortness of breath, orthopnea, PND, lower extremity edema, dizziness, presyncope, syncope, or neurologic sequela. The patient is tolerating medications without difficulties and is otherwise without complaint today.   Past Medical History  Diagnosis Date  . Persistent atrial fibrillation ([place])   . sinus node dysfunction//post termination pauses   . HTN (hypertension)   . CAD (coronary artery disease)     LHC 5/08:  pOM 20%, pRCA 20-30%, EF 60%  . NASH (nonalcoholic steatohepatitis)   . PMR (polymyalgia rheumatica) (HCC)     no steriods for 5 years  . HLD (hyperlipidemia)   . Neuromuscular disorder ([place])   . Hepatitis     hx of medication induced hepatitis (Vytorin per pt)  . Ascending  aorta enlargement (HCC)     4.8 cm per echo 08/07/11; 5.0 by CT in Jan 2013  . Pacemaker-Medtronic 11/12/2011    Implanted 2013   . Hx of echocardiogram     echo 1/13: EF 55%, Gr 2 diast dysfn, Asc Ao aneurysm 4.8 cm, mild MR, mod LAE, mild RAE  . ICH (intracerebral hemorrhage) ([place]) 03/24/15  . Stroke ([place])   . History of loop recorder    Past Surgical History  Procedure Laterality Date  . Umbilical hernia repair  1950's  . Incision and drainage breast abscess  1973  . Cardiac catheterization    . Hernia repair    . Pacemaker insertion  Jan 2013  . Cardioversion  03/02/2012    Procedure: CARDIOVERSION;  Surgeon: Lelon Perla, MD;  Location: [place];  Service: Cardiovascular;  Laterality: N/A;  . Cardioversion  03/13/2012    Procedure: CARDIOVERSION;  Surgeon: Josue Hector, MD;  Location: [facility] ENDOSCOPY;  Service: Cardiovascular;  Laterality: N/A;  . Cardioversion  04/15/2012    Procedure: CARDIOVERSION;  Surgeon: [name], MD;  Location: [facility] ENDOSCOPY;  Service: Cardiovascular;  Laterality: N/A;  . Cardioversion N/A 06/07/2014    Procedure: CARDIOVERSION;  Surgeon: [name], MD;  Location: [place];  Service: Cardiovascular;  Laterality: N/A;  . Permanent pacemaker insertion N/A 07/29/2011    Procedure: PERMANENT PACEMAKER INSERTION;  Surgeon: [name], MD;  Location: [facility] CATH LAB;  Service: Cardiovascular;  Laterality: N/A;  . Cardioversion N/A 08/18/2014    Procedure: CARDIOVERSION;  Surgeon: [name], MD;  Location: [place];  Service: Cardiovascular;  Laterality: N/A;  . Cardioversion N/A 09/06/2015    Procedure: CARDIOVERSION;  Surgeon: Jerline Pain, MD;  Location: [facility] ENDOSCOPY;  Service: Cardiovascular;  Laterality: N/A;    Current Outpatient Prescriptions  Medication Sig Dispense Refill  . ALPRAZolam (XANAX) 0.25 MG tablet Take 0.25 mg by mouth daily as needed for anxiety. Takes as needed    . amiodarone (PACERONE) 200 MG tablet Take 1  tablet (200 mg total) by mouth 2 (two) times daily. 60 tablet 2  . apixaban (ELIQUIS) 5 MG TABS tablet Take 1 tablet (5 mg total) by mouth 2 (two) times daily. 60 tablet 6  . Calcium Carbonate-Vitamin D (CALTRATE 600+D PO) Take 1 tablet by mouth 2 (two) times daily.    . cetirizine (ZYRTEC) 10 MG tablet Take 10 mg by mouth daily as needed for allergies.     . Cholecalciferol (VITAMIN D3) 1000 UNITS tablet Take 1,000 Units by mouth at bedtime.     Marland Kitchen diltiazem (CARDIZEM CD) 120 MG 24 hr capsule Take 1 capsule (120 mg total) by mouth daily. 90 capsule 3  . furosemide (LASIX) 20 MG tablet Take 1 tablet (20 mg total) by mouth daily. 30 tablet 9  . labetalol (NORMODYNE) 300 MG tablet Take 1 tablet (300 mg total) by mouth 2 (two) times daily. 60 tablet 6  . multivitamin (THERAGRAN) per tablet Take 1 tablet by mouth at bedtime.     . potassium chloride (K-DUR) 10 MEQ tablet Take 1 tablet (10 mEq total) by mouth 2 (two) times daily. 180 tablet 3  . valsartan (DIOVAN) 320 MG tablet TAKE 1 TABLET BY MOUTH DAILY 30 tablet 11  . WELCHOL 625 MG tablet Take 625-1,250 mg by mouth 2 (two) times daily. Take 625 mg every morning and 1250 mg at bedtime.     No current facility-administered medications for this encounter.    Allergies  Allergen Reactions  . Penicillins Anaphylaxis    Has patient had a PCN reaction causing immediate rash, facial/tongue/throat swelling, SOB or lightheadedness with hypotension: Yes Has patient had a PCN reaction causing severe rash involving mucus membranes or skin necrosis: No Has patient had a PCN reaction that required hospitalization No Has patient had a PCN reaction occurring within the last 10 years: No If all of the above answers are "NO", then may proceed with Cephalosporin use.   . Statins Other (See Comments)    REACTION: elevated LFT's  . Tylenol [Acetaminophen] Other (See Comments)    If taken with Vytorin at risk for liver damage  . Tape Other (See Comments)     Redness, Please use "paper" tape only.  . Codeine Other (See Comments)    feel funny, head is fuzzy    Social History   Social History  . Marital Status: Married    Spouse Name: N/A  . Number of Children: N/A  . Years of Education: N/A   Occupational History  . Not on file.   Social History Main Topics  . Smoking status: Former Smoker    Types: Cigarettes    Quit date: 07/16/1990  . Smokeless tobacco: Never Used  . Alcohol Use: No  . Drug Use: No  . Sexual Activity: Not on file   Other Topics Concern  . Not on file   Social History Narrative   Pt lives in [place] with spouse.  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 Coronary artery disease    . Alzheimer's disease      ROS- All systems are reviewed and negative except as per the HPI above  Physical Exam: Filed Vitals:   01/25/16 0835  BP: 146/84  Pulse: 84  Height: 5' 6"  (1.676 m)  Weight: 169 lb (76.658 kg)    GEN- The patient is well appearing, alert and oriented x 3 today.   Head- normocephalic, atraumatic Eyes-  Sclera clear, conjunctiva pink Ears- hearing intact Oropharynx- clear Neck- supple, no JVP Lymph- no cervical lymphadenopathy Lungs- Clear to ausculation bilaterally, normal work of breathing Heart- Regular rate and rhythm, no murmurs, rubs or gallops, PMI not laterally displaced GI- soft, NT, ND, + BS Extremities- no clubbing, cyanosis, or edema MS- no significant deformity or atrophy Skin- no rash or lesion Psych- euthymic mood, full affect Neuro- strength and sensation are intact  EKG- V paced rhythm at 84 bpm, qrs int 108 ms, qtc 482 ms  Interrogation done today which confirmed persistent afib rate controlled, normal functioning device Epic records reviewed  Assessment and Plan: 1.Persistent Afib After discussion of DCCV or stopping amiodarone, pt would like to try another DCCV NO missed does of eliquis Will  increase amiodarone to 200 mg bid until day of cardioversion, then reduce to one tab a day CBC, Cmet, TSH today  F/u cardioversion one week  Butch Penny C. Cyanna Neace, [facility] [address] [place], [place] [address] [phone]

## 2016-01-25 NOTE — Patient Instructions (Signed)
Your physician has recommended you make the following change in your medication: Increase Amiodarone to 1 tablet (200 Mg) twice a day, UNTIL AFTER cardioversion.  At that time you may resume 1 tablet daily (200 mg)  Your physician has recommended that you have a Cardioversion (DCCV). Electrical Cardioversion uses a jolt of electricity to your heart either through paddles or wired patches attached to your chest. This is a controlled, usually prescheduled, procedure. Defibrillation is done under light anesthesia in the hospital, and you usually go home the day of the procedure. This is done to get your heart back into a normal rhythm. You are not awake for the procedure. Please see the instruction sheet given to you today.   Your cardioversion is scheduled for : 02/01/2016  Arrive at the [facility] and go to admitting at 12:30  Do Not eat or drink anything after midnight the night prior to your procedure. Take all your medications with a sip of water prior to arrival. Do NOT miss any doses of your blood thinner. You will NOT be able to drive home after your procedure.    A 1 week follow up has been scheduled for you with Roderic Palau, NP

## 2016-01-29 ENCOUNTER — Other Ambulatory Visit (HOSPITAL_COMMUNITY): Payer: Self-pay | Admitting: *Deleted

## 2016-01-30 ENCOUNTER — Other Ambulatory Visit: Payer: Self-pay | Admitting: Internal Medicine

## 2016-01-30 DIAGNOSIS — Z1231 Encounter for screening mammogram for malignant neoplasm of breast: Secondary | ICD-10-CM

## 2016-01-30 LAB — TSH: TSH: <0.01 u[IU]/mL — ABNORMAL LOW (ref 0.350–4.500)

## 2016-02-01 ENCOUNTER — Telehealth: Payer: Self-pay | Admitting: Internal Medicine

## 2016-02-01 MED ORDER — METHIMAZOLE 10 MG PO TABS: 10.0000 mg | ORAL_TABLET | Freq: Three times a day (TID) | ORAL | Status: DC

## 2016-02-01 MED ORDER — PREDNISONE 20 MG PO TABS: ORAL_TABLET | ORAL | Status: DC

## 2016-02-01 NOTE — Telephone Encounter (Signed)
I have notified the patient of her lab results. She is hesitant to start on prednisone for treatment of her TSH. She questioned if she could stop amiodarone. Reviewed with Dr. Caryl Comes- ok to stop amiodarone- start prednisone 40 mg once daily/ methimazole 10 mg TID. We will cancel her DCCV for 7/27 and see her back in the office in 2 weeks. She is agreeable.

## 2016-02-02 NOTE — Telephone Encounter (Signed)
Cardioversion canceled 7/21 @830AM 

## 2016-02-05 ENCOUNTER — Other Ambulatory Visit: Payer: Self-pay | Admitting: Internal Medicine

## 2016-02-06 ENCOUNTER — Other Ambulatory Visit (HOSPITAL_COMMUNITY): Payer: Medicare Other | Admitting: Nurse Practitioner

## 2016-02-08 ENCOUNTER — Encounter (HOSPITAL_COMMUNITY): Admission: RE | Payer: Self-pay | Source: Ambulatory Visit

## 2016-02-08 ENCOUNTER — Ambulatory Visit (HOSPITAL_COMMUNITY): Payer: Medicare Other | Admitting: Nurse Practitioner

## 2016-02-08 ENCOUNTER — Ambulatory Visit (HOSPITAL_COMMUNITY): Admission: RE | Admit: 2016-02-08 | Payer: Medicare Other | Source: Ambulatory Visit | Admitting: Cardiology

## 2016-02-08 SURGERY — CARDIOVERSION
Anesthesia: Monitor Anesthesia Care

## 2016-02-09 ENCOUNTER — Encounter: Payer: Self-pay | Admitting: Internal Medicine

## 2016-02-12 ENCOUNTER — Ambulatory Visit (INDEPENDENT_AMBULATORY_CARE_PROVIDER_SITE_OTHER): Payer: Medicare Other | Admitting: Internal Medicine

## 2016-02-12 ENCOUNTER — Encounter: Payer: Self-pay | Admitting: Internal Medicine

## 2016-02-12 VITALS — BP 136/78 | HR 78 | Ht 66.0 in | Wt 168.1 lb

## 2016-02-12 DIAGNOSIS — Z950 Presence of cardiac pacemaker: Secondary | ICD-10-CM

## 2016-02-12 DIAGNOSIS — I495 Sick sinus syndrome: Secondary | ICD-10-CM

## 2016-02-12 DIAGNOSIS — Z79899 Other long term (current) drug therapy: Secondary | ICD-10-CM

## 2016-02-12 DIAGNOSIS — I480 Paroxysmal atrial fibrillation: Secondary | ICD-10-CM

## 2016-02-12 NOTE — Progress Notes (Signed)
Patient Care Team: [name], MD as PCP - General (Internal Medicine) [name], MD (Cardiology)   HPI  [name] [name] is a 82 y.o. female  Seen in followup for atrial fibrillation in the setting of modest nonobstructive coronary disease and normal left ventricular function. She is status post pacemaker implantation for sinus node dysfunction and posttermination pauses. She's been treated with flecainide and subsequently with Rythmol and most recently amiodarone. This is been complicated by hyperthyroidism. It has been discontinued. Notably she is asymptomatic and her atrial fibrillation at this time.   She's been started on prednisone and methimazole.  She takes apixaban   She saw Dr. Rayann Heman to consider catheter ablation but at that juncture wanted to continue taking amiodarone.     Edema is better with the higher dose diuretics.    She also has a thoracic aortic aneurysm followed by Dr. Servando Snare     Past Medical History:  Diagnosis Date  . Ascending aorta enlargement (HCC)    4.8 cm per echo 08/07/11; 5.0 by CT in Jan 2013  . CAD (coronary artery disease)    LHC 5/08:  pOM 20%, pRCA 20-30%, EF 60%  . Hepatitis    hx of medication induced hepatitis (Vytorin per pt)  . History of loop recorder   . HLD (hyperlipidemia)   . HTN (hypertension)   . Hx of echocardiogram    echo 1/13: EF 55%, Gr 2 diast dysfn, Asc Ao aneurysm 4.8 cm, mild MR, mod LAE, mild RAE  . ICH (intracerebral hemorrhage) ([place]) 03/24/15  . NASH (nonalcoholic steatohepatitis)   . Neuromuscular disorder ([place])   . Pacemaker-Medtronic 11/12/2011   Implanted 2013   . Persistent atrial fibrillation ([place])   . PMR (polymyalgia rheumatica) (HCC)    no steriods for 5 years  . sinus node dysfunction//post termination pauses   . Stroke [facility])     Past Surgical History:  Procedure Laterality Date  . CARDIAC CATHETERIZATION    . CARDIOVERSION  03/02/2012   Procedure: CARDIOVERSION;  Surgeon: Lelon Perla, MD;  Location: [facility] ENDOSCOPY;  Service: Cardiovascular;  Laterality: N/A;  . CARDIOVERSION  03/13/2012   Procedure: CARDIOVERSION;  Surgeon: Josue Hector, MD;  Location: [place];  Service: Cardiovascular;  Laterality: N/A;  . CARDIOVERSION  04/15/2012   Procedure: CARDIOVERSION;  Surgeon: [name], MD;  Location: [facility] ENDOSCOPY;  Service: Cardiovascular;  Laterality: N/A;  . CARDIOVERSION N/A 06/07/2014   Procedure: CARDIOVERSION;  Surgeon: [name], MD;  Location: [facility] ENDOSCOPY;  Service: Cardiovascular;  Laterality: N/A;  . CARDIOVERSION N/A 08/18/2014   Procedure: CARDIOVERSION;  Surgeon: [name], MD;  Location: [place];  Service: Cardiovascular;  Laterality: N/A;  . CARDIOVERSION N/A 09/06/2015   Procedure: CARDIOVERSION;  Surgeon: Jerline Pain, MD;  Location: [place];  Service: Cardiovascular;  Laterality: N/A;  . HERNIA REPAIR    . INCISION AND DRAINAGE BREAST ABSCESS  1973  . PACEMAKER INSERTION  Jan 2013  . PERMANENT PACEMAKER INSERTION N/A 07/29/2011   Procedure: PERMANENT PACEMAKER INSERTION;  Surgeon: [name], MD;  Location: [facility] CATH LAB;  Service: Cardiovascular;  Laterality: N/A;  . UMBILICAL HERNIA REPAIR  1950's    Current Outpatient Prescriptions  Medication Sig Dispense Refill  . apixaban (ELIQUIS) 5 MG TABS tablet Take 1 tablet (5 mg total) by mouth 2 (two) times daily. 60 tablet 6  . Calcium Carbonate-Vitamin D (CALTRATE 600+D PO) Take 1 tablet by mouth 2 (two)  times daily.    . cetirizine (ZYRTEC) 10 MG tablet Take 10 mg by mouth daily as needed for allergies.     . Cholecalciferol (VITAMIN D3) 1000 UNITS tablet Take 1,000 Units by mouth at bedtime.     Marland Kitchen diltiazem (CARDIZEM CD) 120 MG 24 hr capsule Take 1 capsule (120 mg total) by mouth daily. 90 capsule 3  . furosemide (LASIX) 20 MG tablet Take 1 tablet (20 mg total) by mouth daily. 30 tablet 9  . labetalol (NORMODYNE) 300 MG tablet TAKE ONE BY MOUTH TWO TIMES A DAY.  60 tablet 8  . LUTEIN-ZEAXANTHIN PO Take 1 tablet by mouth daily.    . methimazole (TAPAZOLE) 10 MG tablet Take 1 tablet (10 mg total) by mouth 3 (three) times daily. 90 tablet 1  . multivitamin (THERAGRAN) per tablet Take 1 tablet by mouth at bedtime.     . potassium chloride (K-DUR) 10 MEQ tablet Take 1 tablet (10 mEq total) by mouth 2 (two) times daily. 180 tablet 3  . predniSONE (DELTASONE) 20 MG tablet Take two tablets (40 mg) by mouth once daily x 4 weeks as directed 60 tablet 0  . valsartan (DIOVAN) 320 MG tablet TAKE 1 TABLET BY MOUTH DAILY 30 tablet 11  . WELCHOL 625 MG tablet Take 625-1,250 mg by mouth 2 (two) times daily. Take 625 mg every morning and 1250 mg at bedtime.     No current facility-administered medications for this visit.     Allergies  Allergen Reactions  . Penicillins Anaphylaxis    Has patient had a PCN reaction causing immediate rash, facial/tongue/throat swelling, SOB or lightheadedness with hypotension: Yes Has patient had a PCN reaction causing severe rash involving mucus membranes or skin necrosis: No Has patient had a PCN reaction that required hospitalization No Has patient had a PCN reaction occurring within the last 10 years: No If all of the above answers are "NO", then may proceed with Cephalosporin use.   . Statins Other (See Comments)    REACTION: elevated LFT's  . Tylenol [Acetaminophen] Other (See Comments)    If taken with Vytorin at risk for liver damage  . Tape Other (See Comments)    Redness, Please use "paper" tape only.  . Codeine Other (See Comments)    feel funny, head is fuzzy    Review of Systems negative except from HPI and PMH  Physical Exam BP 136/78   Pulse 78   Ht 5' 6"  (1.676 m)   Wt 168 lb 2 oz (76.3 kg)   SpO2 98%   BMI 27.14 kg/m  Well developed and nourished in no acute distress HENT normal Neck supple with JVP 8  Clear Irregularly irregular rate and rhythm, no murmurs or gallops Abd-soft with active BS No  Clubbing cyanosis  R>L tr edema.; nonpitting and involving primarily the forefoot Skin-warm and dry A & Oriented  Grossly normal sensory and motor function    ECG demonstrates atrial fibrillation at 78 Intervals-/11/41    Assessment and  Plan   Atrial fibrillaton persistent  Sinus node dysfunction   Thoracic aortic aneurysm  Hypertension  High risk medication surveillance   Pacemaker The patient's device was interrogated.  The information was reviewed. No changes were made in the programming.     Iatrogenic hyperthyroidism    We have reviewed hyperthyroidism the context of amiodarone therapy. She is currently on prednisone and methimazole. We will wean her prednisone dose in a couple of weeks and check her first repeat  TSH. I should also note that her transaminase was elevated mildly.  She will take 40 daily for 4 weeks, 30 daily for 4 weeks, 20 daily for 4 weeks.

## 2016-02-12 NOTE — Patient Instructions (Signed)
Medication Instructions: - Complete 4 week course of prednisone 40 mg daily Then decrease to 30 mg once daily x 4 weeks, Then decrease to 20 mg once daily x 4 weeks, Then decrease to 10 mg once daily x 4 weeks  Labwork: - Your physician recommends that you return for lab work in: 2-3 weeks (CMET/ TSH)  Procedures/Testing: - none  Follow-Up: - Your physician recommends that you schedule a follow-up appointment in: 12 weeks with Dr. Caryl Comes.  Any Additional Special Instructions Will Be Listed Below (If Applicable).     If you need a refill on your cardiac medications before your next appointment, please call your pharmacy.

## 2016-02-13 ENCOUNTER — Encounter: Payer: Self-pay | Admitting: Internal Medicine

## 2016-02-14 LAB — CUP PACEART INCLINIC DEVICE CHECK
Battery Impedance: 284 Ohm
Brady Statistic AP VS Percent: 0 %
Brady Statistic AS VS Percent: 93 %
Date Time Interrogation Session: 20170731142146
Implantable Lead Implant Date: 20130114
Lead Channel Pacing Threshold Amplitude: 1 V
Lead Channel Pacing Threshold Pulse Width: 0.4 ms
Lead Channel Pacing Threshold Pulse Width: 0.4 ms
Lead Channel Pacing Threshold Pulse Width: 0.4 ms
Lead Channel Setting Pacing Amplitude: 2 V
Lead Channel Setting Pacing Amplitude: 2.5 V
Lead Channel Setting Pacing Pulse Width: 0.4 ms
Lead Channel Setting Sensing Sensitivity: 5.6 mV
MDC IDC LEAD IMPLANT DT: 20130114
MDC IDC LEAD LOCATION: 753859
MDC IDC LEAD LOCATION: 753860
MDC IDC LEAD MODEL: 1948
MDC IDC MSMT BATTERY REMAINING LONGEVITY: 108 mo
MDC IDC MSMT BATTERY VOLTAGE: 2.78 V
MDC IDC MSMT LEADCHNL RA IMPEDANCE VALUE: 467 Ohm
MDC IDC MSMT LEADCHNL RA PACING THRESHOLD AMPLITUDE: 0.5 V
MDC IDC MSMT LEADCHNL RA SENSING INTR AMPL: 4 mV
MDC IDC MSMT LEADCHNL RV IMPEDANCE VALUE: 798 Ohm
MDC IDC MSMT LEADCHNL RV PACING THRESHOLD AMPLITUDE: 0.625 V
MDC IDC MSMT LEADCHNL RV SENSING INTR AMPL: 22.4 mV
MDC IDC STAT BRADY AP VP PERCENT: 0 %
MDC IDC STAT BRADY AS VP PERCENT: 7 %

## 2016-02-15 ENCOUNTER — Ambulatory Visit (HOSPITAL_COMMUNITY): Payer: Medicare Other | Admitting: Nurse Practitioner

## 2016-02-26 ENCOUNTER — Other Ambulatory Visit: Payer: Medicare Other | Admitting: *Deleted

## 2016-02-26 DIAGNOSIS — I480 Paroxysmal atrial fibrillation: Secondary | ICD-10-CM

## 2016-02-26 LAB — COMPREHENSIVE METABOLIC PANEL
ALBUMIN: 3.1 g/dL — AB (ref 3.6–5.1)
ALT: 22 U/L (ref 6–29)
AST: 15 U/L (ref 10–35)
Alkaline Phosphatase: 58 U/L (ref 33–130)
BUN: 20 mg/dL (ref 7–25)
CALCIUM: 8.9 mg/dL (ref 8.6–10.4)
CHLORIDE: 109 mmol/L (ref 98–110)
CO2: 29 mmol/L (ref 20–31)
Creat: 0.91 mg/dL — ABNORMAL HIGH (ref 0.60–0.88)
Glucose, Bld: 99 mg/dL (ref 65–99)
POTASSIUM: 4.3 mmol/L (ref 3.5–5.3)
Sodium: 141 mmol/L (ref 135–146)
Total Bilirubin: 0.8 mg/dL (ref 0.2–1.2)
Total Protein: 5.2 g/dL — ABNORMAL LOW (ref 6.1–8.1)

## 2016-02-26 LAB — TSH: TSH: 0.11 mIU/L — ABNORMAL LOW

## 2016-02-27 ENCOUNTER — Telehealth: Payer: Self-pay

## 2016-02-27 MED ORDER — METHIMAZOLE 10 MG PO TABS: 10.0000 mg | ORAL_TABLET | Freq: Three times a day (TID) | ORAL | 2 refills | Status: DC

## 2016-02-27 MED ORDER — PREDNISONE 20 MG PO TABS: ORAL_TABLET | ORAL | 0 refills | Status: DC

## 2016-02-27 NOTE — Telephone Encounter (Signed)
Pt is a ware of results. I instructed her to stay on her Prednisone taper and to continue to take the Methimazole.  She  instructed me that she would be finishing the 40 mg x 4 weeks portion of her taper on 8/18 and would need a refill sent to her pharmacy for the 30 mg portions to start on 8/19.  She also informed me that she would need refills on her Methimazole. I told her I would get them both sent in today.  I also reiterated to her the instructions for her Taper which would be 30 mg x 4 weeks, 20 mg x 4 weeks, and 10 mg x 4 weeks.  Pt is agreeable to current therapy and understood.

## 2016-02-29 ENCOUNTER — Ambulatory Visit
Admission: RE | Admit: 2016-02-29 | Discharge: 2016-02-29 | Disposition: A | Discharge status: Home / Self Care | Payer: Medicare Other | Source: Ambulatory Visit | Attending: Internal Medicine | Admitting: Internal Medicine

## 2016-02-29 DIAGNOSIS — Z1231 Encounter for screening mammogram for malignant neoplasm of breast: Secondary | ICD-10-CM

## 2016-03-04 ENCOUNTER — Encounter: Payer: Self-pay | Admitting: Neurology

## 2016-03-04 ENCOUNTER — Ambulatory Visit (INDEPENDENT_AMBULATORY_CARE_PROVIDER_SITE_OTHER): Payer: Medicare Other | Admitting: Neurology

## 2016-03-04 VITALS — BP 123/72 | HR 81 | Ht 66.0 in | Wt 177.0 lb

## 2016-03-04 DIAGNOSIS — I7789 Other specified disorders of arteries and arterioles: Secondary | ICD-10-CM

## 2016-03-04 DIAGNOSIS — I481 Persistent atrial fibrillation: Secondary | ICD-10-CM | POA: Diagnosis not present

## 2016-03-04 DIAGNOSIS — Z950 Presence of cardiac pacemaker: Secondary | ICD-10-CM

## 2016-03-04 DIAGNOSIS — I4819 Other persistent atrial fibrillation: Secondary | ICD-10-CM

## 2016-03-04 DIAGNOSIS — Z7901 Long term (current) use of anticoagulants: Secondary | ICD-10-CM | POA: Diagnosis not present

## 2016-03-04 DIAGNOSIS — I611 Nontraumatic intracerebral hemorrhage in hemisphere, cortical: Secondary | ICD-10-CM

## 2016-03-04 NOTE — Progress Notes (Signed)
STROKE NEUROLOGY FOLLOW UP NOTE  NAME: [name] [name] DOB: [birth year]  REASON FOR VISIT: stroke follow up HISTORY FROM: pt and chart  Today we had the pleasure of seeing [name] [name] in follow-up at our Neurology Clinic. Pt was accompanied by husband.   History Summary [name] is a 82 y.o. Female with hx of a-fib on coumadin s/p pacemaker, HTN, CAD, HLD admitted on 03/25/15 for L parietal ICH. INR 2.35, reversed with Kcentra. INR down to 1.13. Symptoms resolved and repeat CT stable. Her BP was low while in hospital and her BP meds has to be hold initially and put back gradually as BP getting up. Still holding off hydralazine on discharge. Plan to repeat CT in one month and restart AC. Pt is discharged in good condition.  Follow up 05/02/15 - the patient has been doing well. However, she went to ER on 04/01/15 for elevated BP and got CT head showed evolving ICH. She went to ER again on 04/06/15 for frontal HA, got CT head again for further evolving ICH. She follows with Dr. Caryl Comes in clinic for afib, on amiodarone and nadolol. Currently BP 120-130 at home. Today BP 114/68. She has thoracic aorta aneurysm, in 07/2011 was 4.8cm and in 03/2015 it showed 4.4 cm.   Follow up 09/05/15 - pt doing well from stroke standpoint. No recurrent symptoms. No bleeding side effects on eliquis. She still has intermittent afib symptomatic. This time again since Thursday. Will have appointment with cardiology to do cardioversion tomorrow. BP today in clinic 95/62. However pt checks her BP at home always 120-130s.  Interval History During the interval time, pt has been doing well from stroke standpoint. She follows with cardiology and stopped amiodarone due to hyperthyroidism, on prednisone and methimazole. Currently still in afib but not symptomatic. She also had repeat CTA chest in 10/2015 and AAA is stable at 4.9cm. BP 123/72.   REVIEW OF SYSTEMS: Full 14 system review of systems performed and notable  only for those listed below and in HPI above, all others are negative:  Constitutional:   Cardiovascular:  Ear/Nose/Throat:   Skin:  Eyes:   Respiratory:   Gastroitestinal:   Genitourinary:  Hematology/Lymphatic:   Endocrine:  Musculoskeletal:   Allergy/Immunology:   Neurological:  Psychiatric:  Sleep:   The following represents the patient's updated allergies and side effects list: Allergies  Allergen Reactions  . Penicillins Anaphylaxis    Has patient had a PCN reaction causing immediate rash, facial/tongue/throat swelling, SOB or lightheadedness with hypotension: Yes Has patient had a PCN reaction causing severe rash involving mucus membranes or skin necrosis: No Has patient had a PCN reaction that required hospitalization No Has patient had a PCN reaction occurring within the last 10 years: No If all of the above answers are "NO", then may proceed with Cephalosporin use.   . Statins Other (See Comments)    REACTION: elevated LFT's  . Tylenol [Acetaminophen] Other (See Comments)    If taken with Vytorin at risk for liver damage  . Tape Other (See Comments)    Redness, Please use "paper" tape only.  . Codeine Other (See Comments)    feel funny, head is fuzzy    The neurologically relevant items on the patient's problem list were reviewed on today's visit.  Neurologic Examination  A problem focused neurological exam (12 or more points of the single system neurologic examination, vital signs counts as 1 point, cranial nerves count for 8 points) was performed.  Blood pressure 123/72, pulse 81, height 5' 6"  (1.676 m), weight 177 lb (80.3 kg).  General - Well nourished, well developed, in no apparent distress.  Ophthalmologic - Sharp disc margins OU.   Cardiovascular - irregularly irregular heart rate and rhythm.  Mental Status -  Level of arousal and orientation to time, place, and person were intact. Language including expression, naming, repetition, comprehension  was assessed and found intact. Fund of Knowledge was assessed and was intact.  Cranial Nerves II - XII - II - Visual field intact OU. III, IV, VI - Extraocular movements intact. V - Facial sensation intact bilaterally. VII - Facial movement intact bilaterally. VIII - Hearing & vestibular intact bilaterally. X - Palate elevates symmetrically. XI - Chin turning & shoulder shrug intact bilaterally. XII - Tongue protrusion intact.  Motor Strength - The patient's strength was normal in all extremities and pronator drift was absent.  Bulk was normal and fasciculations were absent.   Motor Tone - Muscle tone was assessed at the neck and appendages and was normal.  Reflexes - The patient's reflexes were 1+ in all extremities and she had no pathological reflexes.  Sensory - Light touch, temperature/pinprick were assessed and were normal.    Coordination - The patient had normal movements in the hands and feet with no ataxia or dysmetria.  Tremor was absent.  Gait and Station - slow and cautious gait, but steady.  Data reviewed: I personally reviewed the images and agree with the radiology interpretations.  Ct Head Wo Contrast  03/26/2015 IMPRESSION: Slight interval decrease in size of left occipital intraparenchymal hemorrhage. No new acute intracranial finding.   03/26/2015 IMPRESSION: Stable LEFT parietal parasagittal intraparenchymal hematoma as described. No significant increase in hematoma or edema compared with yesterday's CT.   03/25/2015 IMPRESSION: Acute left posterior parietal intraparenchymal hemorrhage is noted.   04/01/15 - Slight increase in the size of left posterior parietal hematoma with development of 6 mm satellite hemorrhage as well, new from most recent prior study.  04/06/15 - Continued presence of intraparenchymal hemorrhage in left posterior parietal cortex with stable amount of surrounding white matter edema. Stable small satellite hemorrhage is noted  more superiorly toward the vertex. No significant midline shift is noted.  2D echo - - Compared to a prior echo in 2013, there are few changes. Pacemaker wires are now noted. There is no longer a pericadrial effusion. The ascending aorta is dilated to 4.4 cm at the most distally visualized point. Consider dedicated imaging of the aorta wtih CT. There is moderate TR with RVSP of 42 mmHg, the RA pressure is estimated at 8 mmHg.  Component     Latest Ref Rng 03/27/2015  Cholesterol     0 - 200 mg/dL 181  Triglycerides     <150 mg/dL 96  HDL Cholesterol     >40 mg/dL 64  Total CHOL/HDL Ratio      2.8  VLDL     0 - 40 mg/dL 19  LDL (calc)     0 - 99 mg/dL 98  Hemoglobin A1C     4.8 - 5.6 % 5.5  Mean Plasma Glucose      111    Assessment: As you may recall, she is a 82 y.o. Caucasian female with PMH of a-fib on coumadin s/p pacemaker, HTN, CAD, HLD admitted on 03/25/15 for L parietal ICH. INR 2.35, reversed with Kcentra. INR down to 1.13. Symptoms resolved and repeat CT stable. AC on hold. Pt is discharged in  good condition. During the interval time, she had two head CT showing evolving hematoma. BP well controlled with meds. No neuro deficit residue. She follows with Dr. Caryl Comes in clinic for afib, on amiodarone and nadolol. She has thoracic aorta aneurysm, in 07/2011 was 4.8cm and in 10/2014 and 10/2015 it showed 4.9 cm, stable no change. Restarted on eliquis 83m bid and tolerating well. Still has paroxysmal afib and followed with cardiology. Amiodarone stopped due to hyperthyroidism, on prednisone taper and methimazole.  Plan:  - continue eliquis 57mtwice a day for stroke prevention - follow up with cardiology for afib management - follow up with CTS for AAA monitoring  - check BP at home and record - Follow up with your primary care physician for stroke risk factor modification. Recommend maintain blood pressure goal <130/80, diabetes with hemoglobin A1c goal below 6.5% and  lipids with LDL cholesterol goal below 70 mg/dL.  - follow up in 6 months.   No orders of the defined types were placed in this encounter.   No orders of the defined types were placed in this encounter.   Patient Instructions  - continue eliquis [id number] a day for stroke prevention - follow up with cardiology for afib management - follow up with CTS for AAA monitoring  - check BP at home and record - Follow up with your primary care physician for stroke risk factor modification. Recommend maintain blood pressure goal <130/80, diabetes with hemoglobin A1c goal below 6.5% and lipids with LDL cholesterol goal below 70 mg/dL.  - follow up in 6 months.   JinRosalin HawkingD PhD [facility] [id number]uiRocky FordeCameronC [phone]

## 2016-03-04 NOTE — Patient Instructions (Addendum)
-   continue eliquis 40m twice a day for stroke prevention - follow up with cardiology for afib management - follow up with CTS for AAA monitoring  - check BP at home and record - Follow up with your primary care physician for stroke risk factor modification. Recommend maintain blood pressure goal <130/80, diabetes with hemoglobin A1c goal below 6.5% and lipids with LDL cholesterol goal below 70 mg/dL.  - follow up in 6 months.

## 2016-03-25 ENCOUNTER — Telehealth: Payer: Self-pay | Admitting: Internal Medicine

## 2016-03-25 MED ORDER — PREDNISONE 20 MG PO TABS: ORAL_TABLET | ORAL | 0 refills | Status: DC

## 2016-03-25 NOTE — Telephone Encounter (Signed)
I spoke with the patient regarding her prednisone dosing. She is aware that she will stay on prednisone 20 mg daily x 4 weeks, Then decrease to 10 mg daily x 4 weeks, Then plan to decrease to 5 mg daily x 4 weeks then stop. She has follow up scheduled with Dr. Caryl Comes on 05/02/16.

## 2016-03-25 NOTE — Telephone Encounter (Signed)
Pt said she needs another prescription for Prednisone,but first she needs to talk to you about it.

## 2016-04-22 ENCOUNTER — Inpatient Hospital Stay (HOSPITAL_COMMUNITY): Payer: Medicare Other | Admitting: Anesthesiology

## 2016-04-22 ENCOUNTER — Inpatient Hospital Stay (HOSPITAL_COMMUNITY): Payer: Medicare Other

## 2016-04-22 ENCOUNTER — Emergency Department (HOSPITAL_COMMUNITY): Payer: Medicare Other

## 2016-04-22 ENCOUNTER — Inpatient Hospital Stay (HOSPITAL_COMMUNITY)
Admission: EM | Admit: 2016-04-22 | Discharge: 2016-04-25 | DRG: 872 | Disposition: A | Discharge status: Home / Self Care | Payer: Medicare Other | Attending: Internal Medicine | Admitting: Internal Medicine

## 2016-04-22 ENCOUNTER — Encounter (HOSPITAL_COMMUNITY)
Admission: EM | Disposition: A | Discharge status: Home / Self Care | Payer: Self-pay | Source: Home / Self Care | Attending: Internal Medicine

## 2016-04-22 ENCOUNTER — Other Ambulatory Visit (HOSPITAL_COMMUNITY): Payer: Medicare Other

## 2016-04-22 ENCOUNTER — Encounter (HOSPITAL_COMMUNITY): Payer: Self-pay | Admitting: Emergency Medicine

## 2016-04-22 ENCOUNTER — Other Ambulatory Visit: Payer: Self-pay | Admitting: Urology

## 2016-04-22 DIAGNOSIS — R945 Abnormal results of liver function studies: Secondary | ICD-10-CM

## 2016-04-22 DIAGNOSIS — Z880 Allergy status to penicillin: Secondary | ICD-10-CM | POA: Diagnosis not present

## 2016-04-22 DIAGNOSIS — E872 Acidosis: Secondary | ICD-10-CM | POA: Diagnosis present

## 2016-04-22 DIAGNOSIS — Z23 Encounter for immunization: Secondary | ICD-10-CM | POA: Diagnosis not present

## 2016-04-22 DIAGNOSIS — R109 Unspecified abdominal pain: Secondary | ICD-10-CM | POA: Diagnosis present

## 2016-04-22 DIAGNOSIS — Z888 Allergy status to other drugs, medicaments and biological substances status: Secondary | ICD-10-CM

## 2016-04-22 DIAGNOSIS — I10 Essential (primary) hypertension: Secondary | ICD-10-CM | POA: Diagnosis present

## 2016-04-22 DIAGNOSIS — Z87891 Personal history of nicotine dependence: Secondary | ICD-10-CM

## 2016-04-22 DIAGNOSIS — N390 Urinary tract infection, site not specified: Secondary | ICD-10-CM | POA: Diagnosis present

## 2016-04-22 DIAGNOSIS — N1330 Unspecified hydronephrosis: Secondary | ICD-10-CM

## 2016-04-22 DIAGNOSIS — I5032 Chronic diastolic (congestive) heart failure: Secondary | ICD-10-CM | POA: Diagnosis present

## 2016-04-22 DIAGNOSIS — I712 Thoracic aortic aneurysm, without rupture: Secondary | ICD-10-CM | POA: Diagnosis present

## 2016-04-22 DIAGNOSIS — M353 Polymyalgia rheumatica: Secondary | ICD-10-CM | POA: Diagnosis present

## 2016-04-22 DIAGNOSIS — I480 Paroxysmal atrial fibrillation: Secondary | ICD-10-CM | POA: Diagnosis present

## 2016-04-22 DIAGNOSIS — N201 Calculus of ureter: Secondary | ICD-10-CM

## 2016-04-22 DIAGNOSIS — Z7901 Long term (current) use of anticoagulants: Secondary | ICD-10-CM | POA: Diagnosis not present

## 2016-04-22 DIAGNOSIS — E785 Hyperlipidemia, unspecified: Secondary | ICD-10-CM | POA: Diagnosis present

## 2016-04-22 DIAGNOSIS — I2510 Atherosclerotic heart disease of native coronary artery without angina pectoris: Secondary | ICD-10-CM | POA: Diagnosis present

## 2016-04-22 DIAGNOSIS — Z87440 Personal history of urinary (tract) infections: Secondary | ICD-10-CM | POA: Diagnosis not present

## 2016-04-22 DIAGNOSIS — I481 Persistent atrial fibrillation: Secondary | ICD-10-CM | POA: Diagnosis present

## 2016-04-22 DIAGNOSIS — Z91048 Other nonmedicinal substance allergy status: Secondary | ICD-10-CM

## 2016-04-22 DIAGNOSIS — R7989 Other specified abnormal findings of blood chemistry: Secondary | ICD-10-CM | POA: Diagnosis present

## 2016-04-22 DIAGNOSIS — N3001 Acute cystitis with hematuria: Secondary | ICD-10-CM

## 2016-04-22 DIAGNOSIS — N200 Calculus of kidney: Secondary | ICD-10-CM

## 2016-04-22 DIAGNOSIS — E0590 Thyrotoxicosis, unspecified without thyrotoxic crisis or storm: Secondary | ICD-10-CM | POA: Diagnosis not present

## 2016-04-22 DIAGNOSIS — N132 Hydronephrosis with renal and ureteral calculous obstruction: Secondary | ICD-10-CM | POA: Diagnosis present

## 2016-04-22 DIAGNOSIS — E038 Other specified hypothyroidism: Secondary | ICD-10-CM

## 2016-04-22 DIAGNOSIS — I714 Abdominal aortic aneurysm, without rupture, unspecified: Secondary | ICD-10-CM

## 2016-04-22 DIAGNOSIS — I482 Chronic atrial fibrillation: Secondary | ICD-10-CM | POA: Diagnosis not present

## 2016-04-22 DIAGNOSIS — Z950 Presence of cardiac pacemaker: Secondary | ICD-10-CM | POA: Diagnosis not present

## 2016-04-22 DIAGNOSIS — N179 Acute kidney failure, unspecified: Secondary | ICD-10-CM | POA: Diagnosis present

## 2016-04-22 DIAGNOSIS — E039 Hypothyroidism, unspecified: Secondary | ICD-10-CM

## 2016-04-22 DIAGNOSIS — I110 Hypertensive heart disease with heart failure: Secondary | ICD-10-CM | POA: Diagnosis present

## 2016-04-22 DIAGNOSIS — K759 Inflammatory liver disease, unspecified: Secondary | ICD-10-CM | POA: Diagnosis present

## 2016-04-22 DIAGNOSIS — Z8673 Personal history of transient ischemic attack (TIA), and cerebral infarction without residual deficits: Secondary | ICD-10-CM

## 2016-04-22 DIAGNOSIS — A419 Sepsis, unspecified organism: Principal | ICD-10-CM | POA: Diagnosis present

## 2016-04-22 DIAGNOSIS — Z885 Allergy status to narcotic agent status: Secondary | ICD-10-CM

## 2016-04-22 DIAGNOSIS — Z419 Encounter for procedure for purposes other than remedying health state, unspecified: Secondary | ICD-10-CM

## 2016-04-22 DIAGNOSIS — I959 Hypotension, unspecified: Secondary | ICD-10-CM | POA: Diagnosis present

## 2016-04-22 DIAGNOSIS — I4819 Other persistent atrial fibrillation: Secondary | ICD-10-CM

## 2016-04-22 DIAGNOSIS — I4891 Unspecified atrial fibrillation: Secondary | ICD-10-CM | POA: Diagnosis present

## 2016-04-22 HISTORY — DX: Urinary tract infection, site not specified: N39.0

## 2016-04-22 HISTORY — PX: CYSTOSCOPY WITH STENT PLACEMENT: SHX5790

## 2016-04-22 LAB — I-STAT CG4 LACTIC ACID, ED
LACTIC ACID, VENOUS: 2.61 mmol/L — AB (ref 0.5–1.9)
Lactic Acid, Venous: 1.64 mmol/L (ref 0.5–1.9)

## 2016-04-22 LAB — COMPREHENSIVE METABOLIC PANEL
ALK PHOS: 282 U/L — AB (ref 38–126)
ALT: 140 U/L — AB (ref 14–54)
AST: 79 U/L — ABNORMAL HIGH (ref 15–41)
Albumin: 3 g/dL — ABNORMAL LOW (ref 3.5–5.0)
Anion gap: 11 (ref 5–15)
BILIRUBIN TOTAL: 1.6 mg/dL — AB (ref 0.3–1.2)
BUN: 15 mg/dL (ref 6–20)
CALCIUM: 9.9 mg/dL (ref 8.9–10.3)
CO2: 25 mmol/L (ref 22–32)
CREATININE: 1.24 mg/dL — AB (ref 0.44–1.00)
Chloride: 103 mmol/L (ref 101–111)
GFR calc non Af Amer: 39 mL/min — ABNORMAL LOW (ref >60)
GFR, EST AFRICAN AMERICAN: 46 mL/min — AB (ref >60)
Glucose, Bld: 110 mg/dL — ABNORMAL HIGH (ref 65–99)
Potassium: 4.1 mmol/L (ref 3.5–5.1)
SODIUM: 139 mmol/L (ref 135–145)
TOTAL PROTEIN: 6.2 g/dL — AB (ref 6.5–8.1)

## 2016-04-22 LAB — URINE MICROSCOPIC-ADD ON

## 2016-04-22 LAB — LACTIC ACID, PLASMA: LACTIC ACID, VENOUS: 1.9 mmol/L (ref 0.5–1.9)

## 2016-04-22 LAB — URINALYSIS, ROUTINE W REFLEX MICROSCOPIC
Bilirubin Urine: NEGATIVE
Glucose, UA: NEGATIVE mg/dL
Ketones, ur: NEGATIVE mg/dL
NITRITE: POSITIVE — AB
PH: 7.5 (ref 5.0–8.0)
Protein, ur: 100 mg/dL — AB
SPECIFIC GRAVITY, URINE: 1.024 (ref 1.005–1.030)

## 2016-04-22 LAB — CBC WITH DIFFERENTIAL/PLATELET
Basophils Absolute: 0 10*3/uL (ref 0.0–0.1)
Basophils Relative: 0 %
EOS ABS: 0.2 10*3/uL (ref 0.0–0.7)
Eosinophils Relative: 2 %
HEMATOCRIT: 37.7 % (ref 36.0–46.0)
HEMOGLOBIN: 11.9 g/dL — AB (ref 12.0–15.0)
LYMPHS ABS: 0.7 10*3/uL (ref 0.7–4.0)
LYMPHS PCT: 8 %
MCH: 29.6 pg (ref 26.0–34.0)
MCHC: 31.6 g/dL (ref 30.0–36.0)
MCV: 93.8 fL (ref 78.0–100.0)
MONOS PCT: 0 %
Monocytes Absolute: 0 10*3/uL — ABNORMAL LOW (ref 0.1–1.0)
NEUTROS ABS: 8.2 10*3/uL — AB (ref 1.7–7.7)
NEUTROS PCT: 90 %
Platelets: 285 10*3/uL (ref 150–400)
RBC: 4.02 MIL/uL (ref 3.87–5.11)
RDW: 20.5 % — ABNORMAL HIGH (ref 11.5–15.5)
WBC: 9.1 10*3/uL (ref 4.0–10.5)

## 2016-04-22 LAB — APTT: aPTT: 30 seconds (ref 24–36)

## 2016-04-22 LAB — PROTIME-INR
INR: 1.2
PROTHROMBIN TIME: 15.3 s — AB (ref 11.4–15.2)

## 2016-04-22 LAB — PROCALCITONIN

## 2016-04-22 LAB — LIPASE, BLOOD: Lipase: 25 U/L (ref 11–51)

## 2016-04-22 SURGERY — CYSTOSCOPY, WITH STENT INSERTION
Anesthesia: General | Laterality: Right

## 2016-04-22 MED ORDER — LACTATED RINGERS IV SOLN
INTRAVENOUS | Status: DC | PRN
  Administered 2016-04-22: 12:00:00 via INTRAVENOUS

## 2016-04-22 MED ORDER — SODIUM CHLORIDE 0.9 % IV BOLUS (SEPSIS)
1000.0000 mL | Freq: Once | INTRAVENOUS | Status: AC
  Administered 2016-04-22: 1000 mL via INTRAVENOUS

## 2016-04-22 MED ORDER — CIPROFLOXACIN IN D5W 400 MG/200ML IV SOLN
400.0000 mg | Freq: Two times a day (BID) | INTRAVENOUS | Status: DC
  Administered 2016-04-22 – 2016-04-25 (×5): 400 mg via INTRAVENOUS
  Filled 2016-04-22 (×7): qty 200

## 2016-04-22 MED ORDER — SODIUM CHLORIDE 0.9 % IR SOLN
Status: DC | PRN
  Administered 2016-04-22: 3000 mL

## 2016-04-22 MED ORDER — CIPROFLOXACIN IN D5W 400 MG/200ML IV SOLN
400.0000 mg | Freq: Once | INTRAVENOUS | Status: AC
  Administered 2016-04-22: 400 mg via INTRAVENOUS
  Filled 2016-04-22: qty 200

## 2016-04-22 MED ORDER — ONDANSETRON HCL 4 MG/2ML IJ SOLN: 4.0000 mg | Freq: Four times a day (QID) | INTRAMUSCULAR | Status: DC | PRN

## 2016-04-22 MED ORDER — DEXTROSE 5 % IV SOLN
INTRAVENOUS | Status: DC | PRN
  Administered 2016-04-22: 100 ug/min via INTRAVENOUS

## 2016-04-22 MED ORDER — FENTANYL CITRATE (PF) 100 MCG/2ML IJ SOLN
INTRAMUSCULAR | Status: DC | PRN
  Administered 2016-04-22: 50 ug via INTRAVENOUS

## 2016-04-22 MED ORDER — MORPHINE SULFATE (PF) 4 MG/ML IV SOLN
4.0000 mg | Freq: Once | INTRAVENOUS | Status: AC
  Administered 2016-04-22: 4 mg via INTRAVENOUS
  Filled 2016-04-22: qty 1

## 2016-04-22 MED ORDER — APIXABAN 5 MG PO TABS
5.0000 mg | ORAL_TABLET | Freq: Two times a day (BID) | ORAL | Status: DC
  Administered 2016-04-22 – 2016-04-25 (×6): 5 mg via ORAL
  Filled 2016-04-22 (×6): qty 1

## 2016-04-22 MED ORDER — IOPAMIDOL (ISOVUE-300) INJECTION 61%: 75.0000 mL | Freq: Once | INTRAVENOUS | Status: DC | PRN

## 2016-04-22 MED ORDER — METRONIDAZOLE IN NACL 5-0.79 MG/ML-% IV SOLN
500.0000 mg | Freq: Once | INTRAVENOUS | Status: AC
  Administered 2016-04-22: 500 mg via INTRAVENOUS
  Filled 2016-04-22: qty 100

## 2016-04-22 MED ORDER — SODIUM CHLORIDE 0.9% FLUSH
3.0000 mL | Freq: Two times a day (BID) | INTRAVENOUS | Status: DC
  Administered 2016-04-24 – 2016-04-25 (×3): 3 mL via INTRAVENOUS

## 2016-04-22 MED ORDER — ONDANSETRON HCL 4 MG PO TABS: 4.0000 mg | ORAL_TABLET | Freq: Four times a day (QID) | ORAL | Status: DC | PRN

## 2016-04-22 MED ORDER — ONDANSETRON HCL 4 MG/2ML IJ SOLN
4.0000 mg | Freq: Once | INTRAMUSCULAR | Status: AC
  Administered 2016-04-22: 4 mg via INTRAVENOUS
  Filled 2016-04-22: qty 2

## 2016-04-22 MED ORDER — HYDROMORPHONE HCL 1 MG/ML IJ SOLN
1.0000 mg | Freq: Once | INTRAMUSCULAR | Status: AC
  Administered 2016-04-22: 1 mg via INTRAVENOUS
  Filled 2016-04-22: qty 1

## 2016-04-22 MED ORDER — 0.9 % SODIUM CHLORIDE (POUR BTL) OPTIME
TOPICAL | Status: DC | PRN
  Administered 2016-04-22: 1000 mL

## 2016-04-22 MED ORDER — PROMETHAZINE HCL 25 MG/ML IJ SOLN: 6.2500 mg | INTRAMUSCULAR | Status: DC | PRN

## 2016-04-22 MED ORDER — IBUPROFEN 800 MG PO TABS
800.0000 mg | ORAL_TABLET | Freq: Once | ORAL | Status: AC
  Administered 2016-04-22: 800 mg via ORAL
  Filled 2016-04-22: qty 1

## 2016-04-22 MED ORDER — IOPAMIDOL (ISOVUE-300) INJECTION 61%
INTRAVENOUS | Status: AC
  Filled 2016-04-22: qty 50

## 2016-04-22 MED ORDER — FENTANYL CITRATE (PF) 100 MCG/2ML IJ SOLN: 25.0000 ug | INTRAMUSCULAR | Status: DC | PRN

## 2016-04-22 MED ORDER — LIDOCAINE HCL 2 % EX GEL
CUTANEOUS | Status: AC
  Filled 2016-04-22: qty 20

## 2016-04-22 MED ORDER — SODIUM CHLORIDE 0.9 % IV SOLN
INTRAVENOUS | Status: AC
  Administered 2016-04-22: 16:00:00 via INTRAVENOUS

## 2016-04-22 MED ORDER — PROPOFOL 500 MG/50ML IV EMUL
INTRAVENOUS | Status: DC | PRN
  Administered 2016-04-22: 25 ug/kg/min via INTRAVENOUS

## 2016-04-22 MED ORDER — PHENYLEPHRINE HCL 10 MG/ML IJ SOLN
INTRAMUSCULAR | Status: AC
  Filled 2016-04-22: qty 1

## 2016-04-22 MED ORDER — FENTANYL CITRATE (PF) 100 MCG/2ML IJ SOLN
INTRAMUSCULAR | Status: AC
  Filled 2016-04-22: qty 4

## 2016-04-22 MED ORDER — ENOXAPARIN SODIUM 40 MG/0.4ML ~~LOC~~ SOLN: 40.0000 mg | SUBCUTANEOUS | Status: DC

## 2016-04-22 MED ORDER — MEPERIDINE HCL 25 MG/ML IJ SOLN: 6.2500 mg | INTRAMUSCULAR | Status: DC | PRN

## 2016-04-22 MED ORDER — SODIUM CHLORIDE 0.9 % IV BOLUS (SEPSIS)
500.0000 mL | Freq: Once | INTRAVENOUS | Status: AC
  Administered 2016-04-22: 500 mL via INTRAVENOUS

## 2016-04-22 MED ORDER — PROPOFOL 10 MG/ML IV BOLUS
INTRAVENOUS | Status: AC
  Filled 2016-04-22: qty 20

## 2016-04-22 MED ORDER — IOPAMIDOL (ISOVUE-300) INJECTION 61%
INTRAVENOUS | Status: AC
  Filled 2016-04-22: qty 75

## 2016-04-22 SURGICAL SUPPLY — 32 items
ADAPTER CATH URET PLST 4-6FR (CATHETERS) IMPLANT
BAG URINE DRAINAGE (UROLOGICAL SUPPLIES) ×3 IMPLANT
BAG URO CATCHER STRL LF (MISCELLANEOUS) ×3 IMPLANT
BENZOIN TINCTURE PRP APPL 2/3 (GAUZE/BANDAGES/DRESSINGS) IMPLANT
BLADE 10 SAFETY STRL DISP (BLADE) ×3 IMPLANT
BUCKET BIOHAZARD WASTE 5 GAL (MISCELLANEOUS) IMPLANT
CATH FOLEY 2WAY SLVR  5CC 16FR (CATHETERS)
CATH FOLEY 2WAY SLVR 5CC 16FR (CATHETERS) IMPLANT
CATH INTERMIT  6FR 70CM (CATHETERS) ×3 IMPLANT
CATH URET 5FR 28IN CONE TIP (BALLOONS)
CATH URET 5FR 70CM CONE TIP (BALLOONS) IMPLANT
COVER SURGICAL LIGHT HANDLE (MISCELLANEOUS) ×3 IMPLANT
DRAPE CAMERA CLOSED 9X96 (DRAPES) ×3 IMPLANT
GLOVE BIO SURGEON STRL SZ7.5 (GLOVE) ×3 IMPLANT
GOWN STRL REUS W/ TWL XL LVL3 (GOWN DISPOSABLE) ×2 IMPLANT
GOWN STRL REUS W/TWL XL LVL3 (GOWN DISPOSABLE) ×4
GUIDEWIRE ANG ZIPWIRE 038X150 (WIRE) IMPLANT
GUIDEWIRE COOK  .035 (WIRE) IMPLANT
GUIDEWIRE STR DUAL SENSOR (WIRE) ×3 IMPLANT
KIT ROOM TURNOVER OR (KITS) ×3 IMPLANT
MANIFOLD NEPTUNE II (INSTRUMENTS) IMPLANT
NS IRRIG 1000ML POUR BTL (IV SOLUTION) ×3 IMPLANT
PACK CYSTOSCOPY (CUSTOM PROCEDURE TRAY) ×3 IMPLANT
PAD ARMBOARD 7.5X6 YLW CONV (MISCELLANEOUS) ×6 IMPLANT
PLUG CATH AND CAP STER (CATHETERS) IMPLANT
STENT INLAY 6X24 (STENTS) IMPLANT
STENT URET 6FRX24 CONTOUR (STENTS) ×3 IMPLANT
SYRINGE CONTROL L 12CC (SYRINGE) ×3 IMPLANT
SYRINGE TOOMEY DISP (SYRINGE) IMPLANT
UNDERPAD 30X30 (UNDERPADS AND DIAPERS) ×3 IMPLANT
WATER STERILE IRR 1000ML POUR (IV SOLUTION) ×3 IMPLANT
WIRE COONS/BENSON .038X145CM (WIRE) IMPLANT

## 2016-04-22 NOTE — ED Notes (Signed)
Pt. Transported to short stay at this time.

## 2016-04-22 NOTE — Anesthesia Preprocedure Evaluation (Addendum)
Anesthesia Evaluation  Patient identified by MRN, date of birth, ID band Patient awake    Reviewed: Allergy & Precautions, NPO status , Patient's Chart, lab work & pertinent test results, reviewed documented beta blocker date and time   Airway Mallampati: II  TM Distance: >3 FB Neck ROM: Full    Dental   Pulmonary former smoker,    breath sounds clear to auscultation       Cardiovascular hypertension, On Medications + CAD and + Peripheral Vascular Disease  + dysrhythmias Atrial Fibrillation + pacemaker  Rhythm:Regular Rate:Normal     Neuro/Psych    GI/Hepatic (+) Hepatitis -  Endo/Other    Renal/GU      Musculoskeletal   Abdominal   Peds  Hematology   Anesthesia Other Findings   Reproductive/Obstetrics                            Anesthesia Physical Anesthesia Plan  ASA: III and emergent  Anesthesia Plan: General   Post-op Pain Management:    Induction: Intravenous, Rapid sequence and Cricoid pressure planned  Airway Management Planned: Oral ETT  Additional Equipment:   Intra-op Plan: Delibrate Circulatory arrest per surgeon request  Post-operative Plan: Extubation in OR  Informed Consent: I have reviewed the patients History and Physical, chart, labs and discussed the procedure including the risks, benefits and alternatives for the proposed anesthesia with the patient or authorized representative who has indicated his/her understanding and acceptance.   Dental advisory given  Plan Discussed with: CRNA  Anesthesia Plan Comments:         Anesthesia Quick Evaluation

## 2016-04-22 NOTE — Anesthesia Procedure Notes (Signed)
Procedure Name: MAC Date/Time: 04/22/2016 1:19 PM Performed by: Neldon Newport Pre-anesthesia Checklist: Timeout performed, Patient being monitored, Suction available, Emergency Drugs available and Patient identified Oxygen Delivery Method: Simple face mask

## 2016-04-22 NOTE — Anesthesia Postprocedure Evaluation (Signed)
Anesthesia Post Note  Patient: [name] [name]  Procedure(s) Performed: Procedure(s) (LRB): CYSTOSCOPY WITH RIGHT URETERAL STENT INSERTION (Right)  Patient location during evaluation: PACU Anesthesia Type: General Level of consciousness: awake and alert Pain management: pain level controlled Vital Signs Assessment: post-procedure vital signs reviewed and stable Respiratory status: spontaneous breathing, nonlabored ventilation, respiratory function stable and patient connected to nasal cannula oxygen Cardiovascular status: blood pressure returned to baseline and stable Postop Assessment: no signs of nausea or vomiting Anesthetic complications: no    Last Vitals:  Vitals:   04/22/16 1800 04/22/16 1927  BP: (!) 93/56 (!) 96/57  Pulse: 81 86  Resp: 16 (!) 22  Temp:  36.8 C    Last Pain:  Vitals:   04/22/16 1927  TempSrc: Oral  PainSc:                  Hattye Siegfried A

## 2016-04-22 NOTE — ED Notes (Signed)
Patient transported to Ultrasound 

## 2016-04-22 NOTE — H&P (Signed)
History and Physical    [name] [name] XHB:[id number] DOB: 1931/04/09 DOA: 04/22/2016  PCP: [name], MD Patient coming from: home  Chief Complaint: right flank pain  HPI: [name] [name] is a very pleasant 82 y.o. female with medical history significant for COPD, hypertension, mass, A. fib, stroke presents to the emergency department with the chief complaint right flank pain. Workup in the emergency department reveals sepsis likely related to urinary tract infection in the setting of ureterolithiasis right hydronephrosis.  Information is obtained from the chart and the patient. She states she was in her usual state of health until this morning around 2 AM she got up to use the bathroom developed sudden right flank pain. Associated symptoms include nausea without emesis. She notes that she had right groin pain intermittently over the last several days but it was short lived. She describes the pain as an ache nothing makes it better or worse. She denies any fever chills headache dizziness syncope or near-syncope. She denies any chest pain palpitation shortness of breath lower extremity edema. She denies any dysuria hematuria frequency or urgency. She does report recently taken off amiodarone due to thyroid affects and just finished a prednisone course as treatment.    ED Course: In the emergency department she has a temperature of 102 she is slightly tachycardic hypotensive with an elevated lactic acid. She is provided with IV fluids and Cipro and Flagyl. The time of admission blood pressure remained soft but improved.  Review of Systems: As per HPI otherwise 10 point review of systems negative.   Ambulatory Status: Ambulates independently somewhat unsteady gait no recent falls  Past Medical History:  Diagnosis Date  . Ascending aorta enlargement (HCC)    4.8 cm per echo 08/07/11; 5.0 by CT in Jan 2013  . CAD (coronary artery disease)    LHC 5/08:  pOM 20%, pRCA 20-30%, EF 60%  .  Hepatitis    hx of medication induced hepatitis (Vytorin per pt)  . History of loop recorder   . HLD (hyperlipidemia)   . HTN (hypertension)   . Hx of echocardiogram    echo 1/13: EF 55%, Gr 2 diast dysfn, Asc Ao aneurysm 4.8 cm, mild MR, mod LAE, mild RAE  . ICH (intracerebral hemorrhage) ([place]) 03/24/15  . NASH (nonalcoholic steatohepatitis)   . Neuromuscular disorder ([place])   . Pacemaker-Medtronic 11/12/2011   Implanted 2013   . Persistent atrial fibrillation ([place])   . PMR (polymyalgia rheumatica) (HCC)    no steriods for 5 years  . sinus node dysfunction//post termination pauses   . Stroke ([place])   . Urinary tract infection    Recurrent infections.     Past Surgical History:  Procedure Laterality Date  . CARDIAC CATHETERIZATION    . CARDIOVERSION  03/02/2012   Procedure: CARDIOVERSION;  Surgeon: Lelon Perla, MD;  Location: [facility] ENDOSCOPY;  Service: Cardiovascular;  Laterality: N/A;  . CARDIOVERSION  03/13/2012   Procedure: CARDIOVERSION;  Surgeon: Josue Hector, MD;  Location: [facility] ENDOSCOPY;  Service: Cardiovascular;  Laterality: N/A;  . CARDIOVERSION  04/15/2012   Procedure: CARDIOVERSION;  Surgeon: [name], MD;  Location: [facility] ENDOSCOPY;  Service: Cardiovascular;  Laterality: N/A;  . CARDIOVERSION N/A 06/07/2014   Procedure: CARDIOVERSION;  Surgeon: [name], MD;  Location: [facility] ENDOSCOPY;  Service: Cardiovascular;  Laterality: N/A;  . CARDIOVERSION N/A 08/18/2014   Procedure: CARDIOVERSION;  Surgeon: [name], MD;  Location: [place];  Service: Cardiovascular;  Laterality: N/A;  . CARDIOVERSION  N/A 09/06/2015   Procedure: CARDIOVERSION;  Surgeon: Jerline Pain, MD;  Location: [place];  Service: Cardiovascular;  Laterality: N/A;  . HERNIA REPAIR    . INCISION AND DRAINAGE BREAST ABSCESS  1973  . PACEMAKER INSERTION  Jan 2013  . PERMANENT PACEMAKER INSERTION N/A 07/29/2011   Procedure: PERMANENT PACEMAKER INSERTION;  Surgeon: [name], MD;   Location: [facility] CATH LAB;  Service: Cardiovascular;  Laterality: N/A;  . UMBILICAL HERNIA REPAIR  1950's    Social History   Social History  . Marital status: Married    Spouse name: N/A  . Number of children: N/A  . Years of education: N/A   Occupational History  . Not on file.   Social History Main Topics  . Smoking status: Former Smoker    Types: Cigarettes    Quit date: 07/16/1990  . Smokeless tobacco: Never Used  . Alcohol use No  . Drug use: No  . Sexual activity: Not on file   Other Topics Concern  . Not on file   Social History Narrative   Pt lives in [place] with spouse.  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 Penicillins Anaphylaxis    Has patient had a PCN reaction causing immediate rash, facial/tongue/throat swelling, SOB or lightheadedness with hypotension: Yes Has patient had a PCN reaction causing severe rash involving mucus membranes or skin necrosis: No Has patient had a PCN reaction that required hospitalization No Has patient had a PCN reaction occurring within the last 10 years: No If all of the above answers are "NO", then may proceed with Cephalosporin use.   . Statins Other (See Comments)    REACTION: elevated LFT's  . Tylenol [Acetaminophen] Other (See Comments)    If taken with Vytorin at risk for liver damage  . Tape Other (See Comments)    Redness, Please use "paper" tape only.  . Codeine Other (See Comments)    feel funny, head is fuzzy    Family History  Problem Relation Age of Onset  . Coronary artery disease Mother   . Coronary artery disease    . Alzheimer's disease    . Coronary artery disease Brother   . Coronary artery disease Brother     Prior to Admission medications   Medication Sig Start Date End Date Taking? Authorizing Provider  apixaban (ELIQUIS) 5 MG TABS tablet Take 1 tablet (5 mg total) by mouth 2 (two) times daily. 10/24/15  Yes Rosalin Hawking, MD  Calcium  Carbonate-Vitamin D (CALTRATE 600+D PO) Take 1 tablet by mouth 2 (two) times daily.   Yes Historical Provider, MD  cetirizine (ZYRTEC) 10 MG tablet Take 10 mg by mouth daily as needed for allergies.    Yes Historical Provider, MD  Cholecalciferol (VITAMIN D3) 1000 UNITS tablet Take 1,000 Units by mouth at bedtime.    Yes Historical Provider, MD  diltiazem (CARDIZEM CD) 120 MG 24 hr capsule Take 1 capsule (120 mg total) by mouth daily. 10/09/15  Yes [name], MD  furosemide (LASIX) 20 MG tablet Take 1 tablet (20 mg total) by mouth daily. 11/29/15  Yes [name], MD  labetalol (NORMODYNE) 300 MG tablet TAKE ONE BY MOUTH TWO TIMES A DAY. 02/06/16  Yes [name], MD  methimazole (TAPAZOLE) 10 MG tablet Take 1 tablet (10 mg total) by mouth 3 (three) times daily. 02/27/16  Yes [name], MD  multivitamin [facility]) per tablet Take 1  tablet by mouth at bedtime.    Yes Historical Provider, MD  potassium chloride (K-DUR) 10 MEQ tablet Take 1 tablet (10 mEq total) by mouth 2 (two) times daily. 10/09/15  Yes [name], MD  valsartan (DIOVAN) 320 MG tablet TAKE 1 TABLET BY MOUTH DAILY 10/25/15  Yes [name], MD  [facility] 625 MG tablet Take 625-1,250 mg by mouth 2 (two) times daily. Take 625 mg every morning and 1250 mg at bedtime. 05/08/12  Yes Historical Provider, MD    Physical Exam: Vitals:   04/22/16 1130 04/22/16 1200 04/22/16 1220 04/22/16 1230  BP: 98/61 (!) 82/49 (!) 89/53 99/65  Pulse: 93 92 94 92  Resp: 18 21 23 18   Temp:      TempSrc:      SpO2: 94% 92% 94% 95%  Weight:      Height:         General:  Appears calm and comfortable, no acute distress Eyes:  PERRL, EOMI, normal lids, iris ENT:  grossly normal hearing, lips & tongue, his membranes of her mouth are pain but dry Neck:  no LAD, masses or thyromegaly Cardiovascular:  Irregularly irregular no m/r/g. No LE edema.  Respiratory:  CTA bilaterally, no w/r/r. Normal respiratory effort. Abdomen:  soft, ntnd,  positive bowel sounds but sluggish no guarding or rebounding Skin:  no rash or induration seen on limited exam Musculoskeletal:  grossly normal tone BUE/BLE, good ROM, no bony abnormality Psychiatric:  grossly normal mood and affect, speech fluent and appropriate, AOx3 Neurologic:  CN 2-12 grossly intact, moves all extremities in coordinated fashion, sensation intact  Labs on Admission: I have personally reviewed following labs and imaging studies  CBC:  Recent Labs Lab 04/22/16 0640  WBC 9.1  NEUTROABS 8.2*  HGB 11.9*  HCT 37.7  MCV 93.8  PLT 382   Basic Metabolic Panel:  Recent Labs Lab 04/22/16 0640  NA 139  K 4.1  CL 103  CO2 25  GLUCOSE 110*  BUN 15  CREATININE 1.24*  CALCIUM 9.9   GFR: Estimated Creatinine Clearance: 37.6 mL/min (by C-G formula based on SCr of 1.24 mg/dL (H)). Liver Function Tests:  Recent Labs Lab 04/22/16 0640  AST 79*  ALT 140*  ALKPHOS 282*  BILITOT 1.6*  PROT 6.2*  ALBUMIN 3.0*    Recent Labs Lab 04/22/16 0730  LIPASE 25   No results for input(s): AMMONIA in the last 168 hours. Coagulation Profile:  Recent Labs Lab 04/22/16 1221  INR 1.20   Cardiac Enzymes: No results for input(s): CKTOTAL, CKMB, CKMBINDEX, TROPONINI in the last 168 hours. BNP (last 3 results) No results for input(s): PROBNP in the last 8760 hours. HbA1C: No results for input(s): HGBA1C in the last 72 hours. CBG: No results for input(s): GLUCAP in the last 168 hours. Lipid Profile: No results for input(s): CHOL, HDL, LDLCALC, TRIG, CHOLHDL, LDLDIRECT in the last 72 hours. Thyroid Function Tests: No results for input(s): TSH, T4TOTAL, FREET4, T3FREE, THYROIDAB in the last 72 hours. Anemia Panel: No results for input(s): VITAMINB12, FOLATE, FERRITIN, TIBC, IRON, RETICCTPCT in the last 72 hours. Urine analysis:    Component Value Date/Time   COLORURINE RED (A) 04/22/2016 0957   APPEARANCEUR CLOUDY (A) 04/22/2016 0957   LABSPEC 1.024 04/22/2016  0957   PHURINE 7.5 04/22/2016 0957   GLUCOSEU NEGATIVE 04/22/2016 0957   HGBUR LARGE (A) 04/22/2016 0957   BILIRUBINUR NEGATIVE 04/22/2016 0957   KETONESUR NEGATIVE 04/22/2016 0957   PROTEINUR 100 (A) 04/22/2016  0957   UROBILINOGEN 0.2 01/14/2016 1337   NITRITE POSITIVE (A) 04/22/2016 0957   LEUKOCYTESUR LARGE (A) 04/22/2016 0957    Creatinine Clearance: Estimated Creatinine Clearance: 37.6 mL/min (by C-G formula based on SCr of 1.24 mg/dL (H)).  Sepsis Labs: @LABRCNTIP (procalcitonin:4,lacticidven:4) )No results found for this or any previous visit (from the past 240 hour(s)).   Radiological Exams on Admission: Ct Abdomen Pelvis W Contrast  Result Date: 04/22/2016 CLINICAL DATA:  Right flank pain and nausea and vomiting starting at 2 a.m. EXAM: CT ABDOMEN AND PELVIS WITH CONTRAST TECHNIQUE: Multidetector CT imaging of the abdomen and pelvis was performed using the standard protocol following bolus administration of intravenous contrast. CONTRAST:  75 cc Isovue 300 COMPARISON:  Abdominal radiograph 04/08/2016. Abdominal ultrasound 11/28/2006. FINDINGS: Lower chest: Bandlike scarring or atelectasis in the right lower lobe. 2 mm nodule, right middle lobe, image 8/3. Aortic valve calcification and right coronary artery calcification. Pacer leads noted. Mild cardiomegaly. Hepatobiliary: Punctate calcification along the inferior capsular margin of the right hepatic lobe on image 34/2, likely incidental. Gallbladder unremarkable. Pancreas: Unremarkable Spleen: Unremarkable Adrenals/Urinary Tract: Adrenal glands normal. Right perirenal stranding and delayed nephrogram minutes delayed excretion of contrast to the right kidney the associated with mild moderate right hydronephrosis and moderate right hydroureter extending down to a 2 mm right ureterovesical junction calculus on image 76/2. There is a 1 mm upper pole nonobstructive right renal calculus on image 94/5. No other renal calculi identified.  Urinary bladder normal. Exophytic from the right kidney upper pole posteriorly there is a 1.4 by 1.2 by 1.1 cm lesion measured at 21 Hounsfield units on portal venous phase images and 20 Hounsfield units on delayed phase images. However, density measurement may be septic to inaccuracy due to volume averaging. Looking back to the prior chest CT of 08/13/2011, this lesion was of a similar size and density. Fluid in the gallbladder measures 12 Hounsfield units today. Exaggerated anterior-posterior long axis of the right kidney. Stomach/Bowel: Redundant sigmoid colon. Vascular/Lymphatic: Aortoiliac atherosclerotic vascular disease. Fusiform infrarenal ectasia up to 2.7 cm, without overt aneurysm. No pathologic adenopathy identified. Reproductive: Unremarkable Other: Trace free fluid in the right perirectal space, image 70/2. Musculoskeletal: Slightly transitional S1 segment. Otherwise unremarkable. IMPRESSION: 1. 2 mm right UVJ calculus associated with moderate right hydroureter, mild to moderate right hydronephrosis, delayed enhancement of the right kidney, delayed excretion of the right kidney, and right perinephric stranding. There is also a separate 1 mm right kidney upper pole nonobstructive renal calculus. 2. Mildly complex right kidney upper pole lesion measuring up to 1.4 cm, no change in size or apparent complexity compared to 1/20 03/2012. Most likely this is a complex cyst. I do not have precontrast imaging available to directly assess for enhancement and assigned a Bosniak classification. 3. Vascular findings include aortic valve calcification, right coronary artery calcification, aortoiliac atherosclerotic calcification, and fusiform infrarenal ectasia up to 2.7 cm without overt aneurysm. 4. Trace right perirectal fluid, fairly close in position to the mildly dilated right ureter and potentially related to the ureteral inflammation/obstruction. 5. Mild cardiomegaly. Electronically Signed   By: Van Clines M.D.   On: 04/22/2016 09:29   US Abdomen Limited  Result Date: 04/22/2016 CLINICAL DATA:  Right upper quadrant pain elevated LFT EXAM: US ABDOMEN LIMITED - RIGHT UPPER QUADRANT COMPARISON:  CT abdomen pelvis 04/22/2016 FINDINGS: Gallbladder: No gallstones or wall thickening visualized. No sonographic Murphy sign noted by sonographer. Common bile duct: Diameter: 4.2 mm Liver: No focal lesion identified. Within normal  limits in parenchymal echogenicity. Mild right hydronephrosis. IMPRESSION: Negative for gallstones Right hydronephrosis as noted on CT. Electronically Signed   By: Franchot Gallo M.D.   On: 04/22/2016 10:00    EKG: Independently reviewed. trial fibrillation Nonspecific IVCD with LAD LVH with secondary repolarization abnormality Inferior infarct, old Anterior infarct, old   Assessment/Plan Principal Problem:   Sepsis ([place]) Active Problems:   Essential hypertension   Atrial fibrillation (HCC)   Chronic diastolic heart failure (HCC)   Chronic anticoagulation   Cardiac pacemaker in situ   Elevated LFTs   Hydronephrosis   Renal lithiasis   UTI (urinary tract infection)   Acute kidney injury ([place])   #1. Sepsis. Secondary to a urinary tract infection in the setting of obstructive ureterolithiasis and hydronephrosis. CT as noted above. Patient with temperature 102.2 rectally. Hypotension lactic acidosis, acute kidney injury. Provided with fluid resuscitation Cipro and Flagyl. Improving at admission. Lactic acid trending down on admission -Admit to step down -Continue vigorous fluid resuscitation -Continue antibiotics per protocol -Follow blood culture -Follow urine culture -Monitor   #2. Urinary tract infection in the setting of obstructive ureterolithiasis and hydronephrosis. CT and renal ultrasound as noted above. -Hold nephrotoxins -IV fluids -Management -Antibiotics per protocol -Follow urine culture -Cystoscopy with right ureteral stenting per  urology -Appreciate urology assistance  #3. Acute kidney injury. Creatinine 1.24. Related to #1 and #2. -IV fluids as noted above -Monitor urine output  #4. Hypertension. Blood pressures been very soft in the emergency department. Home medications include Cardizem, Lasix, labetalol, valsartan -Hold home antihypertensives for now -Mature closely and resume as indicated  #5. A. fib. On eliquis. Chadvasc score 5. EKG without acute changes. No chest pain. -Hold Eliquis for procedure -resume request per urology  6. Elevated LFTs. Patient with a history of fatty liver. Evaluated by GI in the emergency department who opined to go to dilatation and that elevated off disease likely related in part due to ischemic hepatitis and unlikely due to biliary tract obstruction per note. -Monitor  #7. Chronic diastolic heart failure. Not appear overloaded. Echo done last year reveals an EF of 55%. Home medications include Lasix, labetalol -Holding home meds for now as noted above -Weights -Monitor intake and output  #8 CAD. Status post device. No chest pain. EKG without acute changes.   DVT prophylaxis: eluquis  Code Status: full  Family Communication: husband at bedside  Disposition Plan: home  Consults called: wren and dr outlaw  Admission status: inpatient    Radene Gunning MD [facility]  If 7PM-7AM, please contact night-coverage [email or web address] Password [email or web address]  04/22/2016, 1:23 PM

## 2016-04-22 NOTE — Brief Op Note (Signed)
04/22/2016  1:35 PM  PATIENT:  [name] [name]  82 y.o. female  PRE-OPERATIVE DIAGNOSIS:  RIGHT DISTAL STONE WITH SEPSIS  POST-OPERATIVE DIAGNOSIS:  same  PROCEDURE:  Procedure(s): CYSTOSCOPY WITH RIGHT URETERAL STENT INSERTION (Right)  SURGEON:  Surgeon(s) and Role:    * [name], MD - Primary  PHYSICIAN ASSISTANT:   ASSISTANTS: none   ANESTHESIA:   MAC  EBL:  Total I/O In: 2500 [I.V.:2500] Out: 330 [Urine:330]  BLOOD ADMINISTERED:none  DRAINS: 6 x 24 stent   LOCAL MEDICATIONS USED:  LIDOCAINE  and Amount: 8 ml 2% jelly  SPECIMEN:  No Specimen  DISPOSITION OF SPECIMEN:  N/A  COUNTS:  YES  TOURNIQUET:  * No tourniquets in log *  DICTATION: .Other Dictation: Dictation Number [id number]  PLAN OF CARE: Admit to inpatient   PATIENT DISPOSITION:  PACU - hemodynamically stable.   Delay start of Pharmacological VTE agent (>24hrs) due to surgical blood loss or risk of bleeding: not applicable

## 2016-04-22 NOTE — ED Provider Notes (Signed)
[name] [name] Provider Note   CSN: [id number] Arrival date & time: 04/22/16  7322     History   Chief Complaint Chief Complaint  Patient presents with  . Flank Pain    HPI [name] [name] is a 82 y.o. female.  The history is provided by the patient and medical records.  Flank Pain   82 y.o. F with hx of CAD, AFIB on Eliquis, HLP, HTN, NASH, neuromuscular disorder, PMR, presenting to the ED for right flank pain.  Patient states pain began this morning around 2am after having a small bowel movement.  States her bowels have not been moving normally for about 2 weeks since started prednisone due to thyroid issues.  States she did have some urinary frequency this morning as well.  No noted hematuria or dysuria.  No fever, chills, sweats.  States she felt nauseated this morning and in the ambulance but states that is improved now.  She just has continued pain.  Prior abdominal surgeries include hernia repairs.  States hx of "liver issues" but that pain was much different.    Past Medical History:  Diagnosis Date  . Ascending aorta enlargement (HCC)    4.8 cm per echo 08/07/11; 5.0 by CT in Jan 2013  . CAD (coronary artery disease)    LHC 5/08:  pOM 20%, pRCA 20-30%, EF 60%  . Hepatitis    hx of medication induced hepatitis (Vytorin per pt)  . History of loop recorder   . HLD (hyperlipidemia)   . HTN (hypertension)   . Hx of echocardiogram    echo 1/13: EF 55%, Gr 2 diast dysfn, Asc Ao aneurysm 4.8 cm, mild MR, mod LAE, mild RAE  . ICH (intracerebral hemorrhage) ([place]) 03/24/15  . NASH (nonalcoholic steatohepatitis)   . Neuromuscular disorder ([place])   . Pacemaker-Medtronic 11/12/2011   Implanted 2013   . Persistent atrial fibrillation ([place])   . PMR (polymyalgia rheumatica) (HCC)    no steriods for 5 years  . sinus node dysfunction//post termination pauses   . Stroke [facility])     Patient Active Problem List   Diagnosis Date Noted  . Atrial flutter ([place])   . Nontraumatic  cortical hemorrhage of left cerebral hemisphere ([place]) 05/03/2015  . Chronic anticoagulation 05/03/2015  . Cardiac pacemaker in situ 05/03/2015  . ICH (intracerebral hemorrhage) ([place]) 03/25/2015  . Encounter for therapeutic drug monitoring 10/04/2013  . Dyspnea on exertion 07/26/2013  . Chest pain 02/04/2013  . Orthostatic lightheadedness 06/25/2012  . Pacemaker-Medtronic 11/12/2011  . Ascending aorta enlargement ([place]) 08/12/2011  . Chronic diastolic heart failure ([place]) 12/27/2010  . Long term (current) use of anticoagulants 10/15/2010  . PREMATURE VENTRICULAR CONTRACTIONS 10/17/2009  . post termination pauses/.sinus bradycardia 11/17/2008  . Essential hypertension 04/28/2008  . Atrial fibrillation ([place]) 04/28/2008    Past Surgical History:  Procedure Laterality Date  . CARDIAC CATHETERIZATION    . CARDIOVERSION  03/02/2012   Procedure: CARDIOVERSION;  Surgeon: Lelon Perla, MD;  Location: [facility] ENDOSCOPY;  Service: Cardiovascular;  Laterality: N/A;  . CARDIOVERSION  03/13/2012   Procedure: CARDIOVERSION;  Surgeon: Josue Hector, MD;  Location: [facility] ENDOSCOPY;  Service: Cardiovascular;  Laterality: N/A;  . CARDIOVERSION  04/15/2012   Procedure: CARDIOVERSION;  Surgeon: [name], MD;  Location: [facility] ENDOSCOPY;  Service: Cardiovascular;  Laterality: N/A;  . CARDIOVERSION N/A 06/07/2014   Procedure: CARDIOVERSION;  Surgeon: [name], MD;  Location: [facility] ENDOSCOPY;  Service: Cardiovascular;  Laterality: N/A;  . CARDIOVERSION N/A 08/18/2014  Procedure: CARDIOVERSION;  Surgeon: [name], MD;  Location: [place];  Service: Cardiovascular;  Laterality: N/A;  . CARDIOVERSION N/A 09/06/2015   Procedure: CARDIOVERSION;  Surgeon: Jerline Pain, MD;  Location: [place];  Service: Cardiovascular;  Laterality: N/A;  . HERNIA REPAIR    . INCISION AND DRAINAGE BREAST ABSCESS  1973  . PACEMAKER INSERTION  Jan 2013  . PERMANENT PACEMAKER INSERTION N/A 07/29/2011   Procedure:  PERMANENT PACEMAKER INSERTION;  Surgeon: [name], MD;  Location: [facility] CATH LAB;  Service: Cardiovascular;  Laterality: N/A;  . UMBILICAL HERNIA REPAIR  1950's    OB History    No data available       Home Medications    Prior to Admission medications   Medication Sig Start Date End Date Taking? Authorizing Provider  apixaban (ELIQUIS) 5 MG TABS tablet Take 1 tablet (5 mg total) by mouth 2 (two) times daily. 10/24/15   [name] Hawking, MD  Calcium Carbonate-Vitamin D (CALTRATE 600+D PO) Take 1 tablet by mouth 2 (two) times daily.    Historical Provider, MD  cetirizine (ZYRTEC) 10 MG tablet Take 10 mg by mouth daily as needed for allergies.     Historical Provider, MD  Cholecalciferol (VITAMIN D3) 1000 UNITS tablet Take 1,000 Units by mouth at bedtime.     Historical Provider, MD  diltiazem (CARDIZEM CD) 120 MG 24 hr capsule Take 1 capsule (120 mg total) by mouth daily. 10/09/15   [name], MD  furosemide (LASIX) 20 MG tablet Take 1 tablet (20 mg total) by mouth daily. 11/29/15   [name], MD  labetalol (NORMODYNE) 300 MG tablet TAKE ONE BY MOUTH TWO TIMES A DAY. 02/06/16   [name], MD  LUTEIN-ZEAXANTHIN PO Take 1 tablet by mouth daily.    Historical Provider, MD  methimazole (TAPAZOLE) 10 MG tablet Take 1 tablet (10 mg total) by mouth 3 (three) times daily. 02/27/16   [name], MD  multivitamin [facility]) per tablet Take 1 tablet by mouth at bedtime.     Historical Provider, MD  potassium chloride (K-DUR) 10 MEQ tablet Take 1 tablet (10 mEq total) by mouth 2 (two) times daily. 10/09/15   [name], MD  predniSONE (DELTASONE) 20 MG tablet Take one tablet (20 mg) by mouth once daily x 4 weeks 03/25/16   [name], MD  valsartan (DIOVAN) 320 MG tablet TAKE 1 TABLET BY MOUTH DAILY 10/25/15   [name], MD  [facility] 625 MG tablet Take 625-1,250 mg by mouth 2 (two) times daily. Take 625 mg every morning and 1250 mg at bedtime. 05/08/12   Historical Provider, MD     Family History Family History  Problem Relation Age of Onset  . Coronary artery disease Mother   . Coronary artery disease    . Alzheimer's disease    . Coronary artery disease Brother   . Coronary artery disease Brother     Social History Social History  Substance Use Topics  . Smoking status: Former Smoker    Types: Cigarettes    Quit date: 07/16/1990  . Smokeless tobacco: Never Used  . Alcohol use No     Allergies   Penicillins; Statins; Tylenol [acetaminophen]; Tape; and Codeine   Review of Systems Review of Systems  Genitourinary: Positive for flank pain.  All other systems reviewed and are negative.    Physical Exam Updated Vital Signs BP 119/93 (BP Location: Right Arm)   Pulse 105  Temp 97.5 F (36.4 C) (Oral)   Resp (!) 27   Ht 5' 6"  (1.676 m)   Wt 81.2 kg   SpO2 98%   BMI 28.89 kg/m   Physical Exam  Constitutional: She is oriented to person, place, and time. She appears well-developed and well-nourished.  Appears uncomfortable; writhing in bed  HENT:  Head: Normocephalic and atraumatic.  Mouth/Throat: Oropharynx is clear and moist.  Eyes: Conjunctivae and EOM are normal. Pupils are equal, round, and reactive to light.  Neck: Normal range of motion.  Cardiovascular: Normal rate, regular rhythm and normal heart sounds.   Pulmonary/Chest: Effort normal and breath sounds normal.  Abdominal: Soft. Bowel sounds are normal. There is tenderness. There is CVA tenderness (right).  Right CVA tenderness with radiation to right lower abdomen  Musculoskeletal: Normal range of motion.  Neurological: She is alert and oriented to person, place, and time.  Skin: Skin is warm and dry.  Psychiatric: She has a normal mood and affect.  Nursing note and vitals reviewed.    ED Treatments / Results  Labs (all labs ordered are listed, but only abnormal results are displayed) Labs Reviewed  URINALYSIS, ROUTINE W REFLEX MICROSCOPIC (NOT AT [facility]) - Abnormal;  Notable for the following:       Result Value   Color, Urine RED (*)    APPearance CLOUDY (*)    Hgb urine dipstick LARGE (*)    Protein, ur 100 (*)    Nitrite POSITIVE (*)    Leukocytes, UA LARGE (*)    All other components within normal limits  CBC WITH DIFFERENTIAL/PLATELET - Abnormal; Notable for the following:    Hemoglobin 11.9 (*)    RDW 20.5 (*)    Neutro Abs 8.2 (*)    Monocytes Absolute 0.0 (*)    All other components within normal limits  COMPREHENSIVE METABOLIC PANEL - Abnormal; Notable for the following:    Glucose, Bld 110 (*)    Creatinine, Ser 1.24 (*)    Total Protein 6.2 (*)    Albumin 3.0 (*)    AST 79 (*)    ALT 140 (*)    Alkaline Phosphatase 282 (*)    Total Bilirubin 1.6 (*)    GFR calc non Af Amer 39 (*)    GFR calc Af Amer 46 (*)    All other components within normal limits  URINE MICROSCOPIC-ADD ON - Abnormal; Notable for the following:    Squamous Epithelial / LPF 0-5 (*)    Bacteria, UA MANY (*)    All other components within normal limits  I-STAT CG4 LACTIC ACID, ED - Abnormal; Notable for the following:    Lactic Acid, Venous 2.61 (*)    All other components within normal limits  CULTURE, BLOOD (ROUTINE X 2)  CULTURE, BLOOD (ROUTINE X 2)  URINE CULTURE  URINE CULTURE  LIPASE, BLOOD  LACTIC ACID, PLASMA  LACTIC ACID, PLASMA  PROCALCITONIN  PROTIME-INR  APTT  I-STAT CG4 LACTIC ACID, ED  I-STAT CG4 LACTIC ACID, ED  I-STAT CG4 LACTIC ACID, ED    EKG  EKG Interpretation  Date/Time:  Monday April 22 2016 06:18:06 EDT Ventricular Rate:  98 PR Interval:    QRS Duration: 145 QT Interval:  400 QTC Calculation: 506 R Axis:   -77 Text Interpretation:  Atrial fibrillation Nonspecific IVCD with LAD LVH with secondary repolarization abnormality Inferior infarct, old Anterior infarct, old Confirmed by [facility] MD, PEDRO ([id number]) on 04/22/2016 6:32:57 AM  Radiology Ct Abdomen Pelvis W Contrast  Result Date: 04/22/2016 CLINICAL DATA:   Right flank pain and nausea and vomiting starting at 2 a.m. EXAM: CT ABDOMEN AND PELVIS WITH CONTRAST TECHNIQUE: Multidetector CT imaging of the abdomen and pelvis was performed using the standard protocol following bolus administration of intravenous contrast. CONTRAST:  75 cc Isovue 300 COMPARISON:  Abdominal radiograph 04/08/2016. Abdominal ultrasound 11/28/2006. FINDINGS: Lower chest: Bandlike scarring or atelectasis in the right lower lobe. 2 mm nodule, right middle lobe, image 8/3. Aortic valve calcification and right coronary artery calcification. Pacer leads noted. Mild cardiomegaly. Hepatobiliary: Punctate calcification along the inferior capsular margin of the right hepatic lobe on image 34/2, likely incidental. Gallbladder unremarkable. Pancreas: Unremarkable Spleen: Unremarkable Adrenals/Urinary Tract: Adrenal glands normal. Right perirenal stranding and delayed nephrogram minutes delayed excretion of contrast to the right kidney the associated with mild moderate right hydronephrosis and moderate right hydroureter extending down to a 2 mm right ureterovesical junction calculus on image 76/2. There is a 1 mm upper pole nonobstructive right renal calculus on image 94/5. No other renal calculi identified. Urinary bladder normal. Exophytic from the right kidney upper pole posteriorly there is a 1.4 by 1.2 by 1.1 cm lesion measured at 21 Hounsfield units on portal venous phase images and 20 Hounsfield units on delayed phase images. However, density measurement may be septic to inaccuracy due to volume averaging. Looking back to the prior chest CT of 08/13/2011, this lesion was of a similar size and density. Fluid in the gallbladder measures 12 Hounsfield units today. Exaggerated anterior-posterior long axis of the right kidney. Stomach/Bowel: Redundant sigmoid colon. Vascular/Lymphatic: Aortoiliac atherosclerotic vascular disease. Fusiform infrarenal ectasia up to 2.7 cm, without overt aneurysm. No pathologic  adenopathy identified. Reproductive: Unremarkable Other: Trace free fluid in the right perirectal space, image 70/2. Musculoskeletal: Slightly transitional S1 segment. Otherwise unremarkable. IMPRESSION: 1. 2 mm right UVJ calculus associated with moderate right hydroureter, mild to moderate right hydronephrosis, delayed enhancement of the right kidney, delayed excretion of the right kidney, and right perinephric stranding. There is also a separate 1 mm right kidney upper pole nonobstructive renal calculus. 2. Mildly complex right kidney upper pole lesion measuring up to 1.4 cm, no change in size or apparent complexity compared to 1/20 03/2012. Most likely this is a complex cyst. I do not have precontrast imaging available to directly assess for enhancement and assigned a Bosniak classification. 3. Vascular findings include aortic valve calcification, right coronary artery calcification, aortoiliac atherosclerotic calcification, and fusiform infrarenal ectasia up to 2.7 cm without overt aneurysm. 4. Trace right perirectal fluid, fairly close in position to the mildly dilated right ureter and potentially related to the ureteral inflammation/obstruction. 5. Mild cardiomegaly. Electronically Signed   By: Van Clines M.D.   On: 04/22/2016 09:29   US Abdomen Limited  Result Date: 04/22/2016 CLINICAL DATA:  Right upper quadrant pain elevated LFT EXAM: US ABDOMEN LIMITED - RIGHT UPPER QUADRANT COMPARISON:  CT abdomen pelvis 04/22/2016 FINDINGS: Gallbladder: No gallstones or wall thickening visualized. No sonographic Murphy sign noted by sonographer. Common bile duct: Diameter: 4.2 mm Liver: No focal lesion identified. Within normal limits in parenchymal echogenicity. Mild right hydronephrosis. IMPRESSION: Negative for gallstones Right hydronephrosis as noted on CT. Electronically Signed   By: Franchot Gallo M.D.   On: 04/22/2016 10:00    Procedures Procedures (including critical care time)  CRITICAL  CARE Performed by: Larene Pickett   Total critical care time: 45 minutes  Critical care time was exclusive of  separately billable procedures and treating other patients.  Critical care was necessary to treat or prevent imminent or life-threatening deterioration.  Critical care was time spent personally by me on the following activities: development of treatment plan with patient and/or surrogate as well as nursing, discussions with consultants, evaluation of patient's response to treatment, examination of patient, obtaining history from patient or surrogate, ordering and performing treatments and interventions, ordering and review of laboratory studies, ordering and review of radiographic studies, pulse oximetry and re-evaluation of patient's condition.   Medications Ordered in ED Medications  iopamidol (ISOVUE-300) 61 % injection (not administered)  iopamidol (ISOVUE-300) 61 % injection 75 mL ( Intravenous MAR Hold 04/22/16 1242)  apixaban (ELIQUIS) tablet 5 mg ( Oral Automatically Held 04/30/16 2200)  enoxaparin (LOVENOX) injection 40 mg ( Subcutaneous Automatically Held 04/30/16 1215)  sodium chloride flush (NS) 0.9 % injection 3 mL ( Intravenous Automatically Held 04/30/16 2200)  0.9 %  sodium chloride infusion (not administered)  ondansetron (ZOFRAN) tablet 4 mg ( Oral MAR Hold 04/22/16 1242)    Or  ondansetron (ZOFRAN) injection 4 mg ( Intravenous MAR Hold 04/22/16 1242)  morphine 4 MG/ML injection 4 mg (4 mg Intravenous Given 04/22/16 5053)  HYDROmorphone (DILAUDID) injection 1 mg (1 mg Intravenous Given 04/22/16 0649)  ondansetron (ZOFRAN) injection 4 mg (4 mg Intravenous Given 04/22/16 0649)  ibuprofen (ADVIL,MOTRIN) tablet 800 mg (800 mg Oral Given 04/22/16 0813)  sodium chloride 0.9 % bolus 1,000 mL (0 mLs Intravenous Stopped 04/22/16 0942)    And  sodium chloride 0.9 % bolus 1,000 mL (0 mLs Intravenous Stopped 04/22/16 0941)    And  sodium chloride 0.9 % bolus 500 mL (0 mLs  Intravenous Stopped 04/22/16 1119)  metroNIDAZOLE (FLAGYL) IVPB 500 mg (0 mg Intravenous Stopped 04/22/16 1011)  ciprofloxacin (CIPRO) IVPB 400 mg (0 mg Intravenous Stopped 04/22/16 1012)  sodium chloride 0.9 % bolus 1,000 mL (1,000 mLs Intravenous New Bag/Given 04/22/16 1226)     Initial Impression / Assessment and Plan / ED Course  I have reviewed the triage vital signs and the nursing notes.  Pertinent labs & imaging results that were available during my care of the patient were reviewed by me and considered in my medical decision making (see chart for details).  Clinical Course   82 year old female here with sudden onset of right flank pain early this morning. Does report decrease in bowel movements over the past 2 weeks since starting prednisone. She was recently taken off her amiodarone due to thyroid effects and was started on prednisone to help with this. States she's been off of the amiodarone for about 2 months now. Patient with rectal temp here of 102.37F.  Basic labs ordered, elevation of LFT's and alk phos.  Lipase WNL.  Normal WBC count.  Lactic acid 2.61.    CT scan with obstructive 52m stone at right UVJ.  UA appears infected with nitrite + urine.  Patient has received cipro/flagyl and fluids per sepsis protocol.  Have discussed with GI-- no overt concerns regarding her LFT's currently, ? Shocky liver per Dr. OPaulita Fujita  Discussed with urology, Dr. WJeffie Pollock- will plan for stenting this afternoon.  Patient to remain NPO.  Will admit to hospitalist for further management.  Patient admitted to stepdown unit. Her hypotension improved with additional IVF.  Patient taken up to short stay at this time for stent placement.  Final Clinical Impressions(s) / ED Diagnoses   Final diagnoses:  Right ureteral stone  Hydronephrosis, unspecified hydronephrosis type  Sepsis  due to urinary tract infection [facility])    New Prescriptions New Prescriptions   No medications on file     Larene Pickett,  Hershal Coria 04/22/16 [place], MD 04/22/16 [phone]

## 2016-04-22 NOTE — Transfer of Care (Signed)
Immediate Anesthesia Transfer of Care Note  Patient: [name] [name]  Procedure(s) Performed: Procedure(s): CYSTOSCOPY WITH RIGHT URETERAL STENT INSERTION (Right)  Patient Location: PACU  Anesthesia Type:MAC  Level of Consciousness: awake, alert  and oriented  Airway & Oxygen Therapy: Patient Spontanous Breathing and Patient connected to nasal cannula oxygen  Post-op Assessment: Report given to RN, Post -op Vital signs reviewed and stable and Patient moving all extremities X 4  Post vital signs: Reviewed and stable  Last Vitals:  Vitals:   04/22/16 1220 04/22/16 1230  BP: (!) 89/53 99/65  Pulse: 94 92  Resp: 23 18  Temp:      Last Pain:  Vitals:   04/22/16 1230  TempSrc:   PainSc: 0-No pain         Complications: No apparent anesthesia complications

## 2016-04-22 NOTE — Progress Notes (Signed)
I was called by ED staff about [name] [name], for concern of fevers and elevated LFTs.  Her LFTs are mildly elevated and her CT shows acute right ureterolithiasis with right-sided hydronephrosis, which is likely cause of her right flank pain and fevers.  CT scan does not show any biliary ductal dilatation.  I told ED staff that I would focus on treatment of her ureterolithiasis, and that in absence of biliary dilatation the elevated LFTs are likely at least in part due to ischemic hepatitis (from patient's sepsis syndrome, presumably urologic in etiology) and highly unlikely due to biliary tract obstruction.  I am happy to speak with admission team further if question(s) arise.

## 2016-04-22 NOTE — ED Notes (Signed)
Urology at bedside.

## 2016-04-22 NOTE — Anesthesia Preprocedure Evaluation (Signed)
Anesthesia Evaluation  Patient identified by MRN, date of birth, ID band Patient awake    Reviewed: Allergy & Precautions, NPO status , Patient's Chart, lab work & pertinent test results, reviewed documented beta blocker date and time   History of Anesthesia Complications Negative for: history of anesthetic complications  Airway Mallampati: II  TM Distance: >3 FB Neck ROM: full    Dental  (+) Teeth Intact, Dental Advidsory Given   Pulmonary COPD,  COPD inhaler, former smoker,    breath sounds clear to auscultation       Cardiovascular hypertension, Pt. on medications and Pt. on home beta blockers + CAD (non-obstructive by cath '08) and + Peripheral Vascular Disease (asc aorta 4.8 cm per echo 08/07/11; 5.0 by CT in Jan 2013)  + dysrhythmias Atrial Fibrillation + pacemaker  Rhythm:Irregular Rate:Normal  '14 myoview: normal '16 ECHO: EF 55%, Gr 2 diast dysfn, 4.4cm ascending aortic aneurysm.   Neuro/Psych negative neurological ROS     GI/Hepatic negative GI ROS, (+) Hepatitis -, Unspecified  Endo/Other  Polymyalgia rheumatica  Renal/GU negative Renal ROS     Musculoskeletal   Abdominal   Peds  Hematology  (+) Blood dyscrasia (coumadin), ,   Anesthesia Other Findings   Reproductive/Obstetrics                             Anesthesia Physical  Anesthesia Plan  ASA: III and emergent  Anesthesia Plan: General   Post-op Pain Management:    Induction: Intravenous  Airway Management Planned: Oral ETT  Additional Equipment:   Intra-op Plan:   Post-operative Plan:   Informed Consent: I have reviewed the patients History and Physical, chart, labs and discussed the procedure including the risks, benefits and alternatives for the proposed anesthesia with the patient or authorized representative who has indicated his/her understanding and acceptance.   Dental Advisory Given  Plan Discussed  with: Anesthesiologist, CRNA and Surgeon  Anesthesia Plan Comments: (Plan routine monitors, GA for cardioversion)        Anesthesia Quick Evaluation

## 2016-04-22 NOTE — ED Triage Notes (Signed)
From home via ems for sudden onset of right sided flank pain.  Reports having constipation 2 weeks ago and still having difficulty going.  Pain started this am at 0200 after having a small bm.  Noticed urinary frequency after this and only able to void a small amount.

## 2016-04-22 NOTE — H&P (Signed)
Subjective: CC: Right flank pain.  Hx:  [name] [name] is an 82 yo WF who I was asked to see by Quincy Carnes PA for sepsis with an obstructing right ureteral stone.  [name] [name] had the onset of voiding symptoms about a week ago and had the onset last night of severe right flank pain with nausea and vomiting.  [name] [name] was febrile to 102 on arrival and has chills.   [name] [name] lactic acid was elevated to 2.61 but has declined to 1.64 with initial management.   [name] [name] UA looks infected and cultures are pending.  [name] [name] has no prior history of stones or GU surgery but has had recurrent UTI's.   [name] [name] pain is improved with medication.  [name] [name] remains hypotensive.  ROS:  Review of Systems  Constitutional: Positive for chills.  Respiratory: Negative for shortness of breath.   Cardiovascular: Positive for leg swelling. Negative for chest pain.  Genitourinary: Positive for flank pain and urgency.  All other systems reviewed and are negative.   Allergies  Allergen Reactions  . Penicillins Anaphylaxis    Has patient had a PCN reaction causing immediate rash, facial/tongue/throat swelling, SOB or lightheadedness with hypotension: Yes Has patient had a PCN reaction causing severe rash involving mucus membranes or skin necrosis: No Has patient had a PCN reaction that required hospitalization No Has patient had a PCN reaction occurring within the last 10 years: No If all of the above answers are "NO", then may proceed with Cephalosporin use.   . Statins Other (See Comments)    REACTION: elevated LFT's  . Tylenol [Acetaminophen] Other (See Comments)    If taken with Vytorin at risk for liver damage  . Tape Other (See Comments)    Redness, Please use "paper" tape only.  . Codeine Other (See Comments)    feel funny, head is fuzzy    Past Medical History:  Diagnosis Date  . Ascending aorta enlargement (HCC)    4.8 cm per echo 08/07/11; 5.0 by CT in Jan 2013  . CAD (coronary artery disease)    LHC 5/08:  pOM 20%, pRCA 20-30%, EF  60%  . Hepatitis    hx of medication induced hepatitis (Vytorin per pt)  . History of loop recorder   . HLD (hyperlipidemia)   . HTN (hypertension)   . Hx of echocardiogram    echo 1/13: EF 55%, Gr 2 diast dysfn, Asc Ao aneurysm 4.8 cm, mild MR, mod LAE, mild RAE  . ICH (intracerebral hemorrhage) ([place]) 03/24/15  . NASH (nonalcoholic steatohepatitis)   . Neuromuscular disorder ([place])   . Pacemaker-Medtronic 11/12/2011   Implanted 2013   . Persistent atrial fibrillation ([place])   . PMR (polymyalgia rheumatica) (HCC)    no steriods for 5 years  . sinus node dysfunction//post termination pauses   . Stroke ([place])   . Urinary tract infection    Recurrent infections.     Past Surgical History:  Procedure Laterality Date  . CARDIAC CATHETERIZATION    . CARDIOVERSION  03/02/2012   Procedure: CARDIOVERSION;  Surgeon: Lelon Perla, MD;  Location: [facility] ENDOSCOPY;  Service: Cardiovascular;  Laterality: N/A;  . CARDIOVERSION  03/13/2012   Procedure: CARDIOVERSION;  Surgeon: Josue Hector, MD;  Location: [facility] ENDOSCOPY;  Service: Cardiovascular;  Laterality: N/A;  . CARDIOVERSION  04/15/2012   Procedure: CARDIOVERSION;  Surgeon: [name], MD;  Location: [facility] ENDOSCOPY;  Service: Cardiovascular;  Laterality: N/A;  . CARDIOVERSION N/A 06/07/2014   Procedure: CARDIOVERSION;  Surgeon: [name], MD;  Location: [place];  Service: Cardiovascular;  Laterality: N/A;  . CARDIOVERSION N/A 08/18/2014   Procedure: CARDIOVERSION;  Surgeon: [name], MD;  Location: [place];  Service: Cardiovascular;  Laterality: N/A;  . CARDIOVERSION N/A 09/06/2015   Procedure: CARDIOVERSION;  Surgeon: Jerline Pain, MD;  Location: [place];  Service: Cardiovascular;  Laterality: N/A;  . HERNIA REPAIR    . INCISION AND DRAINAGE BREAST ABSCESS  1973  . PACEMAKER INSERTION  Jan 2013  . PERMANENT PACEMAKER INSERTION N/A 07/29/2011   Procedure: PERMANENT PACEMAKER INSERTION;  Surgeon: [name],  MD;  Location: [facility] CATH LAB;  Service: Cardiovascular;  Laterality: N/A;  . UMBILICAL HERNIA REPAIR  1950's    Social History   Social History  . Marital status: Married    Spouse name: N/A  . Number of children: N/A  . Years of education: N/A   Occupational History  . Not on file.   Social History Main Topics  . Smoking status: Former Smoker    Types: Cigarettes    Quit date: 07/16/1990  . Smokeless tobacco: Never Used  . Alcohol use No  . Drug use: No  . Sexual activity: Not on file   Other Topics Concern  . Not on file   Social History Narrative   Pt lives in [place] with spouse.  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 Coronary artery disease Mother   . Coronary artery disease    . Alzheimer's disease    . Coronary artery disease Brother   . Coronary artery disease Brother     Anti-infectives: Anti-infectives    Start     Dose/Rate Route Frequency Ordered Stop   04/22/16 0830  metroNIDAZOLE (FLAGYL) IVPB 500 mg     500 mg 100 mL/hr over 60 Minutes Intravenous  Once 04/22/16 0826 04/22/16 1011   04/22/16 0830  ciprofloxacin (CIPRO) IVPB 400 mg     400 mg 200 mL/hr over 60 Minutes Intravenous  Once 04/22/16 0826 04/22/16 1012      Current Facility-Administered Medications  Medication Dose Route Frequency Provider Last Rate Last Dose  . 0.9 %  sodium chloride infusion   Intravenous Continuous Radene Gunning, NP      . apixaban [facility]) tablet 5 mg  5 mg Oral BID Lezlie Octave Black, NP      . enoxaparin (LOVENOX) injection 40 mg  40 mg Subcutaneous Q24H Lezlie Octave Black, NP      . iopamidol (ISOVUE-300) 61 % injection 75 mL  75 mL Intravenous Once PRN Courteney Lyn Mackuen, MD      . iopamidol (ISOVUE-300) 61 % injection           . ondansetron (ZOFRAN) tablet 4 mg  4 mg Oral Q6H PRN Radene Gunning, NP       Or  . ondansetron [facility]) injection 4 mg  4 mg Intravenous Q6H PRN Lezlie Octave Black, NP       . sodium chloride flush (NS) 0.9 % injection 3 mL  3 mL Intravenous Q12H Radene Gunning, NP       Current Outpatient Prescriptions  Medication Sig Dispense Refill  . apixaban (ELIQUIS) 5 MG TABS tablet Take 1 tablet (5 mg total) by mouth 2 (two) times daily. 60 tablet 6  . Calcium Carbonate-Vitamin D (CALTRATE 600+D PO) Take 1 tablet by mouth 2 (two) times daily.    . cetirizine (ZYRTEC) 10  MG tablet Take 10 mg by mouth daily as needed for allergies.     . Cholecalciferol (VITAMIN D3) 1000 UNITS tablet Take 1,000 Units by mouth at bedtime.     Marland Kitchen diltiazem (CARDIZEM CD) 120 MG 24 hr capsule Take 1 capsule (120 mg total) by mouth daily. 90 capsule 3  . furosemide (LASIX) 20 MG tablet Take 1 tablet (20 mg total) by mouth daily. 30 tablet 9  . labetalol (NORMODYNE) 300 MG tablet TAKE ONE BY MOUTH TWO TIMES A DAY. 60 tablet 8  . methimazole (TAPAZOLE) 10 MG tablet Take 1 tablet (10 mg total) by mouth 3 (three) times daily. 90 tablet 2  . multivitamin (THERAGRAN) per tablet Take 1 tablet by mouth at bedtime.     . potassium chloride (K-DUR) 10 MEQ tablet Take 1 tablet (10 mEq total) by mouth 2 (two) times daily. 180 tablet 3  . valsartan (DIOVAN) 320 MG tablet TAKE 1 TABLET BY MOUTH DAILY 30 tablet 11  . WELCHOL 625 MG tablet Take 625-1,250 mg by mouth 2 (two) times daily. Take 625 mg every morning and 1250 mg at bedtime.     Facility-Administered Medications Ordered in Other Encounters  Medication Dose Route Frequency Provider Last Rate Last Dose  . lactated ringers infusion    Continuous PRN Neldon Newport, CRNA        Past medical, surgical, social and family history reviewed and updated.   Objective: Vital signs in last 24 hours: Temp:  [97.5 F (36.4 C)-102.2 F (39 C)] 102.2 F (39 C) (10/09 0803) Pulse Rate:  [92-113] 94 (10/09 1220) Resp:  [18-27] 23 (10/09 1220) BP: (82-136)/(46-97) 89/53 (10/09 1220) SpO2:  [90 %-98 %] 94 % (10/09 1220) Weight:  [81.2 kg (179 lb)] 81.2 kg  (179 lb) (10/09 0620)  Intake/Output from previous day: No intake/output data recorded. Intake/Output this shift: Total I/O In: 2500 [I.V.:2500] Out: 330 [Urine:330]   Physical Exam  Constitutional: [name] [name] is oriented to person, place, and time and well-developed, well-nourished, and in no distress.  HENT:  Head: Normocephalic and atraumatic.  Neck: Normal range of motion. Neck supple. No thyromegaly present.  Cardiovascular: Normal rate and regular rhythm.   No murmur heard. Pulmonary/Chest: Breath sounds normal. No respiratory distress.  Abdominal: Soft. [name] [name] exhibits no distension and no mass. There is tenderness (right flank. ). There is no rebound and no guarding.  Musculoskeletal: Normal range of motion. [name] [name] exhibits no edema or tenderness.  Lymphadenopathy:    [name] [name] has no cervical adenopathy.  Neurological: [name] [name] is alert and oriented to person, place, and time.  Skin: Skin is warm and dry.  Psychiatric: Mood and affect normal.    Lab Results:   Recent Labs  04/22/16 0640  WBC 9.1  HGB 11.9*  HCT 37.7  PLT 285   BMET  Recent Labs  04/22/16 0640  NA 139  K 4.1  CL 103  CO2 25  GLUCOSE 110*  BUN 15  CREATININE 1.24*  CALCIUM 9.9   PT/INR No results for input(s): LABPROT, INR in the last 72 hours. ABG No results for input(s): PHART, HCO3 in the last 72 hours.  Invalid input(s): PCO2, PO2  Studies/Results: Ct Abdomen Pelvis W Contrast  Result Date: 04/22/2016 CLINICAL DATA:  Right flank pain and nausea and vomiting starting at 2 a.m. EXAM: CT ABDOMEN AND PELVIS WITH CONTRAST TECHNIQUE: Multidetector CT imaging of the abdomen and pelvis was performed using the standard protocol following bolus administration of intravenous contrast. CONTRAST:  75 cc Isovue 300 COMPARISON:  Abdominal radiograph 04/08/2016. Abdominal ultrasound 11/28/2006. FINDINGS: Lower chest: Bandlike scarring or atelectasis in the right lower lobe. 2 mm nodule, right middle lobe, image 8/3.  Aortic valve calcification and right coronary artery calcification. Pacer leads noted. Mild cardiomegaly. Hepatobiliary: Punctate calcification along the inferior capsular margin of the right hepatic lobe on image 34/2, likely incidental. Gallbladder unremarkable. Pancreas: Unremarkable Spleen: Unremarkable Adrenals/Urinary Tract: Adrenal glands normal. Right perirenal stranding and delayed nephrogram minutes delayed excretion of contrast to the right kidney the associated with mild moderate right hydronephrosis and moderate right hydroureter extending down to a 2 mm right ureterovesical junction calculus on image 76/2. There is a 1 mm upper pole nonobstructive right renal calculus on image 94/5. No other renal calculi identified. Urinary bladder normal. Exophytic from the right kidney upper pole posteriorly there is a 1.4 by 1.2 by 1.1 cm lesion measured at 21 Hounsfield units on portal venous phase images and 20 Hounsfield units on delayed phase images. However, density measurement may be septic to inaccuracy due to volume averaging. Looking back to the prior chest CT of 08/13/2011, this lesion was of a similar size and density. Fluid in the gallbladder measures 12 Hounsfield units today. Exaggerated anterior-posterior long axis of the right kidney. Stomach/Bowel: Redundant sigmoid colon. Vascular/Lymphatic: Aortoiliac atherosclerotic vascular disease. Fusiform infrarenal ectasia up to 2.7 cm, without overt aneurysm. No pathologic adenopathy identified. Reproductive: Unremarkable Other: Trace free fluid in the right perirectal space, image 70/2. Musculoskeletal: Slightly transitional S1 segment. Otherwise unremarkable. IMPRESSION: 1. 2 mm right UVJ calculus associated with moderate right hydroureter, mild to moderate right hydronephrosis, delayed enhancement of the right kidney, delayed excretion of the right kidney, and right perinephric stranding. There is also a separate 1 mm right kidney upper pole  nonobstructive renal calculus. 2. Mildly complex right kidney upper pole lesion measuring up to 1.4 cm, no change in size or apparent complexity compared to 1/20 03/2012. Most likely this is a complex cyst. I do not have precontrast imaging available to directly assess for enhancement and assigned a Bosniak classification. 3. Vascular findings include aortic valve calcification, right coronary artery calcification, aortoiliac atherosclerotic calcification, and fusiform infrarenal ectasia up to 2.7 cm without overt aneurysm. 4. Trace right perirectal fluid, fairly close in position to the mildly dilated right ureter and potentially related to the ureteral inflammation/obstruction. 5. Mild cardiomegaly. Electronically Signed   By: Van Clines M.D.   On: 04/22/2016 09:29   US Abdomen Limited  Result Date: 04/22/2016 CLINICAL DATA:  Right upper quadrant pain elevated LFT EXAM: US ABDOMEN LIMITED - RIGHT UPPER QUADRANT COMPARISON:  CT abdomen pelvis 04/22/2016 FINDINGS: Gallbladder: No gallstones or wall thickening visualized. No sonographic Murphy sign noted by sonographer. Common bile duct: Diameter: 4.2 mm Liver: No focal lesion identified. Within normal limits in parenchymal echogenicity. Mild right hydronephrosis. IMPRESSION: Negative for gallstones Right hydronephrosis as noted on CT. Electronically Signed   By: Franchot Gallo M.D.   On: 04/22/2016 10:00     Assessment: [name] [name] has a right distal stone with hydronephrosis and sepsis. [name] [name] is on Eliquis for a-fib.  [name] [name] need cystoscopy with right ureteral stenting.   I have reviewed the risks including bleeding, infection, ureteral injury, stent irritation, need for secondary procedures, thrombotic events and anesthetic complications.   I explained the indications and need for source control.  [name] [name] is not a candidate for a perc because of the Eliquis.   [name] [name] will be admitted to the hospital service after  surgery.    CC: Quincy Carnes PA and Dr. Linna Darner.      Navaeh Kehres J 04/22/2016 [phone]

## 2016-04-22 NOTE — Progress Notes (Signed)
Patient expressed concern over not having taken her daily medications. RN has paged Dr. Marily Memos with question. Waiting for reply.

## 2016-04-22 NOTE — Progress Notes (Signed)
2nd page sent to midlevel physician. She returned call and stated she has explained the reason for holding medications and that there are some scheduled to resume tomorrow. RN will again relay this information and reassure her that at the appropriate time the medications will be restarted.

## 2016-04-23 ENCOUNTER — Encounter (HOSPITAL_COMMUNITY): Payer: Self-pay | Admitting: Urology

## 2016-04-23 DIAGNOSIS — R7989 Other specified abnormal findings of blood chemistry: Secondary | ICD-10-CM

## 2016-04-23 DIAGNOSIS — I482 Chronic atrial fibrillation: Secondary | ICD-10-CM

## 2016-04-23 LAB — COMPREHENSIVE METABOLIC PANEL
ALBUMIN: 2.2 g/dL — AB (ref 3.5–5.0)
ALK PHOS: 200 U/L — AB (ref 38–126)
ALT: 96 U/L — ABNORMAL HIGH (ref 14–54)
ANION GAP: 8 (ref 5–15)
AST: 55 U/L — ABNORMAL HIGH (ref 15–41)
BILIRUBIN TOTAL: 3.1 mg/dL — AB (ref 0.3–1.2)
BUN: 16 mg/dL (ref 6–20)
CALCIUM: 8.2 mg/dL — AB (ref 8.9–10.3)
CO2: 21 mmol/L — ABNORMAL LOW (ref 22–32)
Chloride: 109 mmol/L (ref 101–111)
Creatinine, Ser: 1.21 mg/dL — ABNORMAL HIGH (ref 0.44–1.00)
GFR, EST AFRICAN AMERICAN: 47 mL/min — AB (ref >60)
GFR, EST NON AFRICAN AMERICAN: 41 mL/min — AB (ref >60)
GLUCOSE: 76 mg/dL (ref 65–99)
POTASSIUM: 4 mmol/L (ref 3.5–5.1)
SODIUM: 138 mmol/L (ref 135–145)
TOTAL PROTEIN: 4.7 g/dL — AB (ref 6.5–8.1)

## 2016-04-23 LAB — URINE CULTURE

## 2016-04-23 LAB — CBC
HEMATOCRIT: 32.1 % — AB (ref 36.0–46.0)
HEMOGLOBIN: 9.9 g/dL — AB (ref 12.0–15.0)
MCH: 29.3 pg (ref 26.0–34.0)
MCHC: 30.8 g/dL (ref 30.0–36.0)
MCV: 95 fL (ref 78.0–100.0)
Platelets: 226 10*3/uL (ref 150–400)
RBC: 3.38 MIL/uL — ABNORMAL LOW (ref 3.87–5.11)
RDW: 20.5 % — ABNORMAL HIGH (ref 11.5–15.5)
WBC: 13.5 10*3/uL — AB (ref 4.0–10.5)

## 2016-04-23 MED ORDER — ACETAMINOPHEN 325 MG PO TABS
325.0000 mg | ORAL_TABLET | Freq: Four times a day (QID) | ORAL | Status: DC | PRN
  Administered 2016-04-23 (×2): 325 mg via ORAL
  Filled 2016-04-23 (×2): qty 1

## 2016-04-23 MED ORDER — ALUM & MAG HYDROXIDE-SIMETH 200-200-20 MG/5ML PO SUSP
30.0000 mL | ORAL | Status: DC | PRN
  Filled 2016-04-23: qty 30

## 2016-04-23 MED ORDER — INFLUENZA VAC SPLIT QUAD 0.5 ML IM SUSY
0.5000 mL | PREFILLED_SYRINGE | INTRAMUSCULAR | Status: AC
  Administered 2016-04-24: 0.5 mL via INTRAMUSCULAR
  Filled 2016-04-23: qty 0.5

## 2016-04-23 MED ORDER — DILTIAZEM HCL 30 MG PO TABS
30.0000 mg | ORAL_TABLET | Freq: Four times a day (QID) | ORAL | Status: DC
  Administered 2016-04-23 – 2016-04-24 (×5): 30 mg via ORAL
  Filled 2016-04-23 (×5): qty 1

## 2016-04-23 MED ORDER — HYDROCORTISONE ACETATE 25 MG RE SUPP
25.0000 mg | Freq: Two times a day (BID) | RECTAL | Status: DC
  Administered 2016-04-23: 25 mg via RECTAL
  Filled 2016-04-23 (×6): qty 1

## 2016-04-23 NOTE — Care Management Note (Signed)
Case Management Note  Patient Details  Name: [name] [name] MRN: [id number] Date of Birth: [birth year]  Subjective/Objective:     Presents with sepsis,uti, pod 1 for cystoscopy and right stent insertion for right distal stone.  PTA indep, lives with spouse, has PCP [name], she has medication coverage and transportation at discharge.  NCM will cont to follow for dc needs.                Action/Plan:   Expected Discharge Date:                  Expected Discharge Plan:  Home/Self Care  In-House Referral:     Discharge planning Services  CM Consult  Post Acute Care Choice:    Choice offered to:     DME Arranged:    DME Agency:     HH Arranged:    HH Agency:     Status of Service:  In process, will continue to follow  If discussed at Long Length of Stay Meetings, dates discussed:    Additional Comments:  Zenon Mayo, RN 04/23/2016, 3:43 PM

## 2016-04-23 NOTE — Op Note (Signed)
[name] [name], [name] [name] NO.:  [id number]  MEDICAL RECORD NO.:  [id number]  LOCATION:  3S07C                        FACILITY:  [facility]  PHYSICIAN:  Marshall Cork. Jeffie Pollock, M.D.    DATE OF BIRTH:  04-18-1931  DATE OF PROCEDURE:  04/22/2016 DATE OF DISCHARGE:                              OPERATIVE REPORT   PROCEDURE:  Cystoscopy with insertion of right double-J stent.  PREOPERATIVE DIAGNOSIS:  Right distal ureteral stone with obstruction and sepsis.  POSTOPERATIVE DIAGNOSIS:  Right distal ureteral stone with obstruction and sepsis.  SURGEON:  Marshall Cork. Jeffie Pollock, M.D.  ANESTHESIA:  MAC.  DRAINS:  A 6-French x 24 cm right double-J stent.  BLOOD LOSS:  None.  COMPLICATIONS:  None.  INDICATIONS:  [name] is an 82 year old white female, who presented to the emergency room with the onset the night before of right flank pain with nausea and vomiting.  She was found to have fever of 102 and elevated lactic acid.  In the ER, urine appeared infected and a CT scan revealed small obstructing right distal ureteral stone.  It was felt that urgent stenting was indicated.  She was not a candidate for percutaneous nephrostomy tube because of concurrent Eliquis use.  FINDINGS AND PROCEDURE:  She had received Cipro and Flagyl.  She was taken to the operating room, where she was placed on the table in the dorsal lithotomy position.  She was given monitored sedation as needed. Her perineum and genitalia were prepped with Betadine solution.  She was draped in usual sterile fashion.  Cystoscopy was performed using a 22-French scope and 15 degree lens. Examination revealed a normal urethra.  The initial urine was quite cloudy.  The bladder was drained and irrigated 3 times to clear the [facility].  Inspection revealed mild erythema of the bladder without significant trabeculation or mucosal abnormalities.  Ureteral orifices were unremarkable.  The right ureteral orifice was then cannulated  using a Sensor guidewire stiffened by 6-French ureteral catheter.  The wire was easily passed to the kidney.  Once passed the distal ureter, there was efflux of purulent urine.  The open-end catheter was removed and a 6-French 24 cm contour double-J stent was passed over the wire under fluoroscopic guidance without difficulty.  Of note, there was contrast from her initial CT that was apparent down to the distal ureter, confirming the presence of obstruction.  Once the stent was in good position, the wire was removed leaving good coil in the kidney and good coil in the bladder.  The bladder was then drained.  The cystoscope was removed.  The patient was taken down from lithotomy position and moved to recovery room in stable condition. There were no complications.     Marshall Cork. Jeffie Pollock, M.D.     JJW/MEDQ  D:  04/22/2016  T:  04/23/2016  Job:  [id number]

## 2016-04-23 NOTE — Progress Notes (Signed)
1 Day Post-Op  Subjective: [name] [name] is improving clinically and reports minimal pain but some frequency and hematuria from the stent.  She is POD 1 following cystoscopy and right stent insertion for a right distal stone with sepsis.   ROS:  Review of Systems  Constitutional: Negative for chills and fever.  Genitourinary: Positive for frequency and hematuria.  All other systems reviewed and are negative.   Anti-infectives: Anti-infectives    Start     Dose/Rate Route Frequency Ordered Stop   04/23/16 0000  ciprofloxacin (CIPRO) IVPB 400 mg     400 mg 200 mL/hr over 60 Minutes Intravenous Every 12 hours 04/22/16 1336     04/22/16 0830  metroNIDAZOLE (FLAGYL) IVPB 500 mg     500 mg 100 mL/hr over 60 Minutes Intravenous  Once 04/22/16 0826 04/22/16 1011   04/22/16 0830  ciprofloxacin (CIPRO) IVPB 400 mg     400 mg 200 mL/hr over 60 Minutes Intravenous  Once 04/22/16 0826 04/22/16 1012      Current Facility-Administered Medications  Medication Dose Route Frequency Provider Last Rate Last Dose  . alum & mag hydroxide-simeth (MAALOX/MYLANTA) 200-200-20 MG/5ML suspension 30 mL  30 mL Oral PRN Waldemar Dickens, MD      . apixaban Arne Cleveland) tablet 5 mg  5 mg Oral BID Radene Gunning, NP   5 mg at 04/22/16 2220  . ciprofloxacin (CIPRO) IVPB 400 mg  400 mg Intravenous Q12H Lezlie Octave Black, NP   400 mg at 04/22/16 2357  . [START ON 04/24/2016] Influenza vac split quadrivalent PF (FLUARIX) injection 0.5 mL  0.5 mL Intramuscular Tomorrow-1000 Waldemar Dickens, MD      . iopamidol (ISOVUE-300) 61 % injection 75 mL  75 mL Intravenous Once PRN Courteney Lyn Mackuen, MD      . ondansetron (ZOFRAN) tablet 4 mg  4 mg Oral Q6H PRN Radene Gunning, NP       Or  . ondansetron [facility]) injection 4 mg  4 mg Intravenous Q6H PRN Lezlie Octave Black, NP      . sodium chloride flush (NS) 0.9 % injection 3 mL  3 mL Intravenous Q12H Radene Gunning, NP   Stopped at 04/22/16 1215     Objective: Vital signs in last 24  hours: Temp:  [97.8 F (36.6 C)-102.2 F (39 C)] 98.9 F (37.2 C) (10/10 0333) Pulse Rate:  [65-113] 88 (10/10 0333) Resp:  [16-23] 20 (10/10 0333) BP: (82-136)/(46-97) 100/58 (10/10 0333) SpO2:  [90 %-100 %] 96 % (10/10 0333) Weight:  [87.4 kg (192 lb 10.9 oz)] 87.4 kg (192 lb 10.9 oz) (10/10 0343)  Intake/Output from previous day: 10/09 0701 - 10/10 0700 In: 4066.3 [P.O.:50; I.V.:3816.3; IV Piggyback:200] Out: 330 [Urine:330] Intake/Output this shift: No intake/output data recorded.   Physical Exam  Constitutional: She is well-developed, well-nourished, and in no distress.  Cardiovascular: Normal rate and regular rhythm.   Pulmonary/Chest: Effort normal. No respiratory distress.  Abdominal: Soft. There is no tenderness. There is no rebound and no guarding.    Lab Results:   Recent Labs  04/22/16 0640 04/23/16 0403  WBC 9.1 13.5*  HGB 11.9* 9.9*  HCT 37.7 32.1*  PLT 285 226   BMET  Recent Labs  04/22/16 0640 04/23/16 0403  NA 139 138  K 4.1 4.0  CL 103 109  CO2 25 21*  GLUCOSE 110* 76  BUN 15 16  CREATININE 1.24* 1.21*  CALCIUM 9.9 8.2*   PT/INR  Recent Labs  04/22/16 1221  LABPROT 15.3*  INR 1.20   ABG No results for input(s): PHART, HCO3 in the last 72 hours.  Invalid input(s): PCO2, PO2  Studies/Results: Ct Abdomen Pelvis W Contrast  Result Date: 04/22/2016 CLINICAL DATA:  Right flank pain and nausea and vomiting starting at 2 a.m. EXAM: CT ABDOMEN AND PELVIS WITH CONTRAST TECHNIQUE: Multidetector CT imaging of the abdomen and pelvis was performed using the standard protocol following bolus administration of intravenous contrast. CONTRAST:  75 cc Isovue 300 COMPARISON:  Abdominal radiograph 04/08/2016. Abdominal ultrasound 11/28/2006. FINDINGS: Lower chest: Bandlike scarring or atelectasis in the right lower lobe. 2 mm nodule, right middle lobe, image 8/3. Aortic valve calcification and right coronary artery calcification. Pacer leads noted.  Mild cardiomegaly. Hepatobiliary: Punctate calcification along the inferior capsular margin of the right hepatic lobe on image 34/2, likely incidental. Gallbladder unremarkable. Pancreas: Unremarkable Spleen: Unremarkable Adrenals/Urinary Tract: Adrenal glands normal. Right perirenal stranding and delayed nephrogram minutes delayed excretion of contrast to the right kidney the associated with mild moderate right hydronephrosis and moderate right hydroureter extending down to a 2 mm right ureterovesical junction calculus on image 76/2. There is a 1 mm upper pole nonobstructive right renal calculus on image 94/5. No other renal calculi identified. Urinary bladder normal. Exophytic from the right kidney upper pole posteriorly there is a 1.4 by 1.2 by 1.1 cm lesion measured at 21 Hounsfield units on portal venous phase images and 20 Hounsfield units on delayed phase images. However, density measurement may be septic to inaccuracy due to volume averaging. Looking back to the prior chest CT of 08/13/2011, this lesion was of a similar size and density. Fluid in the gallbladder measures 12 Hounsfield units today. Exaggerated anterior-posterior long axis of the right kidney. Stomach/Bowel: Redundant sigmoid colon. Vascular/Lymphatic: Aortoiliac atherosclerotic vascular disease. Fusiform infrarenal ectasia up to 2.7 cm, without overt aneurysm. No pathologic adenopathy identified. Reproductive: Unremarkable Other: Trace free fluid in the right perirectal space, image 70/2. Musculoskeletal: Slightly transitional S1 segment. Otherwise unremarkable. IMPRESSION: 1. 2 mm right UVJ calculus associated with moderate right hydroureter, mild to moderate right hydronephrosis, delayed enhancement of the right kidney, delayed excretion of the right kidney, and right perinephric stranding. There is also a separate 1 mm right kidney upper pole nonobstructive renal calculus. 2. Mildly complex right kidney upper pole lesion measuring up to  1.4 cm, no change in size or apparent complexity compared to 1/20 03/2012. Most likely this is a complex cyst. I do not have precontrast imaging available to directly assess for enhancement and assigned a Bosniak classification. 3. Vascular findings include aortic valve calcification, right coronary artery calcification, aortoiliac atherosclerotic calcification, and fusiform infrarenal ectasia up to 2.7 cm without overt aneurysm. 4. Trace right perirectal fluid, fairly close in position to the mildly dilated right ureter and potentially related to the ureteral inflammation/obstruction. 5. Mild cardiomegaly. Electronically Signed   By: Van Clines M.D.   On: 04/22/2016 09:29   Dg Retrograde Pyelogram  Result Date: 04/22/2016 CLINICAL DATA:  Stone at the right ureterovesical junction. EXAM: RETROGRADE PYELOGRAM COMPARISON:  CT 04/22/2016 FINDINGS: Right ureter was cannulated and a wire was advanced into the right renal pelvis. Stone or filling defect is not confidently seen on these images. A double-J ureteral stent was placed. Proximal aspect of stent is near the upper pole calyx. Distal aspect of stent in the expected region of the bladder. IMPRESSION: Placement of right ureter stent. Electronically Signed   By: Markus Daft M.D.   On:  04/22/2016 13:57   US Abdomen Limited  Result Date: 04/22/2016 CLINICAL DATA:  Right upper quadrant pain elevated LFT EXAM: US ABDOMEN LIMITED - RIGHT UPPER QUADRANT COMPARISON:  CT abdomen pelvis 04/22/2016 FINDINGS: Gallbladder: No gallstones or wall thickening visualized. No sonographic Murphy sign noted by sonographer. Common bile duct: Diameter: 4.2 mm Liver: No focal lesion identified. Within normal limits in parenchymal echogenicity. Mild right hydronephrosis. IMPRESSION: Negative for gallstones Right hydronephrosis as noted on CT. Electronically Signed   By: Franchot Gallo M.D.   On: 04/22/2016 10:00     Assessment and Plan: She is improving clinically and is  tolerating the stent well.  I will schedule her next week for right ureteroscopic stone extraction with stent removal/exchange.  Please reconsult as needed.       LOS: 1 day    Malka So 04/23/2016 [phone] ID: Ofilia Neas, female   DOB: [birth year], 82 y.o.   MRN: [id number]

## 2016-04-23 NOTE — Progress Notes (Signed)
PROGRESS NOTE    [name] [name]  FMB:[id number] DOB: 13-Mar-1931 DOA: 04/22/2016 PCP: [name], MD    Brief Narrative: 82 yo female with ascending aortic aneurysm, presents with right flank pain associated with nausea and vomiting. She was found febrile, tachycardic and hypotensive, diagnosed with sepsis due to urinary tract infection related to 2 mm right UVJ calculus associated with moderate right hydroureter and mild to moderate right hydronephrosis. SP cystoscopy with right ureteral stent insertion.   Assessment & Plan:   Principal Problem:   Sepsis ([place]) Active Problems:   Essential hypertension   Atrial fibrillation (H[facility])   Chronic diastolic heart failure (H[facility])   Chronic anticoagulation   Cardiac pacemaker in situ   Elevated LFTs   Hydronephrosis   Renal lithiasis   UTI (urinary tract infection)   Acute kidney injury ([place])   1. Sepsis due to urolithiasis. Will continue antibiotic therapy with ciprofloxacin, will follow on cultures, cell count and temperature curve.  Will resume gentle IV fluids. Follow on urology recommendations, post stent placement.   2. AKI. Suspected to be pre-renal will continue supportive care with IV fluids and antibiotic therapy, avoid hypotension or nephrotoxic medications.   3. HTN. Blood pressure systolic improved to 701 to 779 systolic. Will continue to hold on furosemide, valsartan and labetalol.   4. Atrial fibrillation. Rate controlled will resume shirt acting diltiazem, to prevent RVR. Continue to monitor telemetry. Continue anticoagulation with apixaban.  Hear rate 102.   5. Elevated LFTs. Chronic elevation of liver enzymes, will follow on LFT in am. Started on low dose acetaminophen for pain control.   6. Heart failure chronic and diastolic. Clinically stable, will continue furosemide and will place patient on gentle hydration with saline.   7. CAD. No chest pain.  DVT prophylaxis: apixaban  Code Status: full Family Communication:  Family at the bedside and all questions were addressed.  Disposition Plan: home    Consultants:   Urology  Procedures:  10/09- Cystoscopy with right ureteral stent insertion.   Antimicrobials:  Ciprofloxacin #1  Subjective: Patient was not able to sleep last night, this am with aches and pains, no nausea or vomiting, no chest pain or dyspnea. Positive hand edema. No fever or chills.   Objective: Vitals:   04/22/16 2316 04/23/16 0333 04/23/16 0343 04/23/16 0744  BP: (!) 96/50 (!) 100/58  128/60  Pulse: 65 88  93  Resp: (!) 21 20  (!) 23  Temp: 98.5 F (36.9 C) 98.9 F (37.2 C)  99.7 F (37.6 C)  TempSrc: Oral Oral  Oral  SpO2: 95% 96%  93%  Weight:   87.4 kg (192 lb 10.9 oz)   Height:        Intake/Output Summary (Last 24 hours) at 04/23/16 0947 Last data filed at 04/23/16 0000  Gross per 24 hour  Intake          4066.25 ml  Output              330 ml  Net          3736.25 ml   Filed Weights   04/22/16 0620 04/23/16 0343  Weight: 81.2 kg (179 lb) 87.4 kg (192 lb 10.9 oz)    Examination:  General exam: Not in pain or dyspnea. E ENT: no conjunctival pallor, oral mucosa moist.   Respiratory system: Clear to auscultation. Respiratory effort normal. Mild decreased breath sounds at bases, no wheezing rales or rhonchi.  Cardiovascular system: S1 & S2 heard, RRR. No  JVD, murmurs, rubs, gallops or clicks. No pedal edema. Gastrointestinal system: Abdomen is nondistended, soft and nontender. No organomegaly or masses felt. Normal bowel sounds heard. Central nervous system: Alert and oriented. No focal neurological deficits. Extremities: Symmetric 5 x 5 power. Skin: No rashes, lesions or ulcers     Data Reviewed: I have personally reviewed following labs and imaging studies  CBC:  Recent Labs Lab 04/22/16 0640 04/23/16 0403  WBC 9.1 13.5*  NEUTROABS 8.2*  --   HGB 11.9* 9.9*  HCT 37.7 32.1*  MCV 93.8 95.0  PLT 285 681   Basic Metabolic Panel:  Recent  Labs Lab 04/22/16 0640 04/23/16 0403  NA 139 138  K 4.1 4.0  CL 103 109  CO2 25 21*  GLUCOSE 110* 76  BUN 15 16  CREATININE 1.24* 1.21*  CALCIUM 9.9 8.2*   GFR: Estimated Creatinine Clearance: 39.9 mL/min (by C-G formula based on SCr of 1.21 mg/dL (H)). Liver Function Tests:  Recent Labs Lab 04/22/16 0640 04/23/16 0403  AST 79* 55*  ALT 140* 96*  ALKPHOS 282* 200*  BILITOT 1.6* 3.1*  PROT 6.2* 4.7*  ALBUMIN 3.0* 2.2*    Recent Labs Lab 04/22/16 0730  LIPASE 25   No results for input(s): AMMONIA in the last 168 hours. Coagulation Profile:  Recent Labs Lab 04/22/16 1221  INR 1.20   Cardiac Enzymes: No results for input(s): CKTOTAL, CKMB, CKMBINDEX, TROPONINI in the last 168 hours. BNP (last 3 results) No results for input(s): PROBNP in the last 8760 hours. HbA1C: No results for input(s): HGBA1C in the last 72 hours. CBG: No results for input(s): GLUCAP in the last 168 hours. Lipid Profile: No results for input(s): CHOL, HDL, LDLCALC, TRIG, CHOLHDL, LDLDIRECT in the last 72 hours. Thyroid Function Tests: No results for input(s): TSH, T4TOTAL, FREET4, T3FREE, THYROIDAB in the last 72 hours. Anemia Panel: No results for input(s): VITAMINB12, FOLATE, FERRITIN, TIBC, IRON, RETICCTPCT in the last 72 hours. Sepsis Labs:  Recent Labs Lab 04/22/16 0815 04/22/16 1142 04/22/16 1221 04/22/16 1522  PROCALCITON  --   --  <0.10  --   LATICACIDVEN 2.61* 1.64  --  1.9    Recent Results (from the past 240 hour(s))  Urine culture     Status: Abnormal   Collection Time: 04/22/16  9:57 AM  Result Value Ref Range Status   Specimen Description URINE, CATHETERIZED  Final   Special Requests NONE  Final   Culture MULTIPLE SPECIES PRESENT, SUGGEST RECOLLECTION (A)  Final   Report Status 04/23/2016 FINAL  Final         Radiology Studies: Ct Abdomen Pelvis W Contrast  Result Date: 04/22/2016 CLINICAL DATA:  Right flank pain and nausea and vomiting starting at 2  a.m. EXAM: CT ABDOMEN AND PELVIS WITH CONTRAST TECHNIQUE: Multidetector CT imaging of the abdomen and pelvis was performed using the standard protocol following bolus administration of intravenous contrast. CONTRAST:  75 cc Isovue 300 COMPARISON:  Abdominal radiograph 04/08/2016. Abdominal ultrasound 11/28/2006. FINDINGS: Lower chest: Bandlike scarring or atelectasis in the right lower lobe. 2 mm nodule, right middle lobe, image 8/3. Aortic valve calcification and right coronary artery calcification. Pacer leads noted. Mild cardiomegaly. Hepatobiliary: Punctate calcification along the inferior capsular margin of the right hepatic lobe on image 34/2, likely incidental. Gallbladder unremarkable. Pancreas: Unremarkable Spleen: Unremarkable Adrenals/Urinary Tract: Adrenal glands normal. Right perirenal stranding and delayed nephrogram minutes delayed excretion of contrast to the right kidney the associated with mild moderate right hydronephrosis and moderate right  hydroureter extending down to a 2 mm right ureterovesical junction calculus on image 76/2. There is a 1 mm upper pole nonobstructive right renal calculus on image 94/5. No other renal calculi identified. Urinary bladder normal. Exophytic from the right kidney upper pole posteriorly there is a 1.4 by 1.2 by 1.1 cm lesion measured at 21 Hounsfield units on portal venous phase images and 20 Hounsfield units on delayed phase images. However, density measurement may be septic to inaccuracy due to volume averaging. Looking back to the prior chest CT of 08/13/2011, this lesion was of a similar size and density. Fluid in the gallbladder measures 12 Hounsfield units today. Exaggerated anterior-posterior long axis of the right kidney. Stomach/Bowel: Redundant sigmoid colon. Vascular/Lymphatic: Aortoiliac atherosclerotic vascular disease. Fusiform infrarenal ectasia up to 2.7 cm, without overt aneurysm. No pathologic adenopathy identified. Reproductive: Unremarkable  Other: Trace free fluid in the right perirectal space, image 70/2. Musculoskeletal: Slightly transitional S1 segment. Otherwise unremarkable. IMPRESSION: 1. 2 mm right UVJ calculus associated with moderate right hydroureter, mild to moderate right hydronephrosis, delayed enhancement of the right kidney, delayed excretion of the right kidney, and right perinephric stranding. There is also a separate 1 mm right kidney upper pole nonobstructive renal calculus. 2. Mildly complex right kidney upper pole lesion measuring up to 1.4 cm, no change in size or apparent complexity compared to 1/20 03/2012. Most likely this is a complex cyst. I do not have precontrast imaging available to directly assess for enhancement and assigned a Bosniak classification. 3. Vascular findings include aortic valve calcification, right coronary artery calcification, aortoiliac atherosclerotic calcification, and fusiform infrarenal ectasia up to 2.7 cm without overt aneurysm. 4. Trace right perirectal fluid, fairly close in position to the mildly dilated right ureter and potentially related to the ureteral inflammation/obstruction. 5. Mild cardiomegaly. Electronically Signed   By: Van Clines M.D.   On: 04/22/2016 09:29   Dg Retrograde Pyelogram  Result Date: 04/22/2016 CLINICAL DATA:  Stone at the right ureterovesical junction. EXAM: RETROGRADE PYELOGRAM COMPARISON:  CT 04/22/2016 FINDINGS: Right ureter was cannulated and a wire was advanced into the right renal pelvis. Stone or filling defect is not confidently seen on these images. A double-J ureteral stent was placed. Proximal aspect of stent is near the upper pole calyx. Distal aspect of stent in the expected region of the bladder. IMPRESSION: Placement of right ureter stent. Electronically Signed   By: Markus Daft M.D.   On: 04/22/2016 13:57   US Abdomen Limited  Result Date: 04/22/2016 CLINICAL DATA:  Right upper quadrant pain elevated LFT EXAM: US ABDOMEN LIMITED - RIGHT  UPPER QUADRANT COMPARISON:  CT abdomen pelvis 04/22/2016 FINDINGS: Gallbladder: No gallstones or wall thickening visualized. No sonographic Murphy sign noted by sonographer. Common bile duct: Diameter: 4.2 mm Liver: No focal lesion identified. Within normal limits in parenchymal echogenicity. Mild right hydronephrosis. IMPRESSION: Negative for gallstones Right hydronephrosis as noted on CT. Electronically Signed   By: Franchot Gallo M.D.   On: 04/22/2016 10:00        Scheduled Meds: . apixaban  5 mg Oral BID  . ciprofloxacin  400 mg Intravenous Q12H  . [START ON 04/24/2016] Influenza vac split quadrivalent PF  0.5 mL Intramuscular Tomorrow-1000  . sodium chloride flush  3 mL Intravenous Q12H   Continuous Infusions:    LOS: 1 day        Larenzo Caples Gerome Apley, MD [facility] Pager [phone]  If 7PM-7AM, please contact night-coverage [email or web address] Password [email or web address] 04/23/2016, 9:47 AM

## 2016-04-24 ENCOUNTER — Other Ambulatory Visit: Payer: Self-pay | Admitting: Urology

## 2016-04-24 DIAGNOSIS — N179 Acute kidney failure, unspecified: Secondary | ICD-10-CM

## 2016-04-24 DIAGNOSIS — I714 Abdominal aortic aneurysm, without rupture: Secondary | ICD-10-CM

## 2016-04-24 DIAGNOSIS — E0590 Thyrotoxicosis, unspecified without thyrotoxic crisis or storm: Secondary | ICD-10-CM

## 2016-04-24 DIAGNOSIS — N200 Calculus of kidney: Secondary | ICD-10-CM

## 2016-04-24 DIAGNOSIS — M353 Polymyalgia rheumatica: Secondary | ICD-10-CM

## 2016-04-24 DIAGNOSIS — I481 Persistent atrial fibrillation: Secondary | ICD-10-CM

## 2016-04-24 DIAGNOSIS — N390 Urinary tract infection, site not specified: Secondary | ICD-10-CM

## 2016-04-24 DIAGNOSIS — I5032 Chronic diastolic (congestive) heart failure: Secondary | ICD-10-CM

## 2016-04-24 DIAGNOSIS — I10 Essential (primary) hypertension: Secondary | ICD-10-CM

## 2016-04-24 DIAGNOSIS — A419 Sepsis, unspecified organism: Principal | ICD-10-CM

## 2016-04-24 LAB — CBC WITH DIFFERENTIAL/PLATELET
BASOS ABS: 0 10*3/uL (ref 0.0–0.1)
Basophils Relative: 0 %
EOS ABS: 0.2 10*3/uL (ref 0.0–0.7)
EOS PCT: 2 %
HCT: 33.6 % — ABNORMAL LOW (ref 36.0–46.0)
Hemoglobin: 10.8 g/dL — ABNORMAL LOW (ref 12.0–15.0)
Lymphocytes Relative: 9 %
Lymphs Abs: 0.9 10*3/uL (ref 0.7–4.0)
MCH: 29.5 pg (ref 26.0–34.0)
MCHC: 32.1 g/dL (ref 30.0–36.0)
MCV: 91.8 fL (ref 78.0–100.0)
MONO ABS: 1 10*3/uL (ref 0.1–1.0)
Monocytes Relative: 9 %
Neutro Abs: 8.8 10*3/uL — ABNORMAL HIGH (ref 1.7–7.7)
Neutrophils Relative %: 80 %
PLATELETS: 246 10*3/uL (ref 150–400)
RBC: 3.66 MIL/uL — AB (ref 3.87–5.11)
RDW: 20.1 % — AB (ref 11.5–15.5)
WBC: 10.9 10*3/uL — AB (ref 4.0–10.5)

## 2016-04-24 LAB — HEPATIC FUNCTION PANEL
ALBUMIN: 2.3 g/dL — AB (ref 3.5–5.0)
ALT: 82 U/L — AB (ref 14–54)
AST: 45 U/L — AB (ref 15–41)
Alkaline Phosphatase: 210 U/L — ABNORMAL HIGH (ref 38–126)
BILIRUBIN TOTAL: 1.9 mg/dL — AB (ref 0.3–1.2)
Bilirubin, Direct: 0.8 mg/dL — ABNORMAL HIGH (ref 0.1–0.5)
Indirect Bilirubin: 1.1 mg/dL — ABNORMAL HIGH (ref 0.3–0.9)
Total Protein: 5.1 g/dL — ABNORMAL LOW (ref 6.5–8.1)

## 2016-04-24 LAB — BASIC METABOLIC PANEL
Anion gap: 7 (ref 5–15)
BUN: 10 mg/dL (ref 6–20)
CO2: 23 mmol/L (ref 22–32)
Calcium: 8.6 mg/dL — ABNORMAL LOW (ref 8.9–10.3)
Chloride: 109 mmol/L (ref 101–111)
Creatinine, Ser: 1.07 mg/dL — ABNORMAL HIGH (ref 0.44–1.00)
GFR calc Af Amer: 54 mL/min — ABNORMAL LOW (ref >60)
GFR calc non Af Amer: 47 mL/min — ABNORMAL LOW (ref >60)
Glucose, Bld: 93 mg/dL (ref 65–99)
Potassium: 4 mmol/L (ref 3.5–5.1)
Sodium: 139 mmol/L (ref 135–145)

## 2016-04-24 LAB — TSH: TSH: 13.943 u[IU]/mL — ABNORMAL HIGH (ref 0.350–4.500)

## 2016-04-24 MED ORDER — DILTIAZEM HCL ER COATED BEADS 120 MG PO CP24
120.0000 mg | ORAL_CAPSULE | Freq: Every day | ORAL | Status: DC
  Administered 2016-04-25 (×2): 120 mg via ORAL
  Filled 2016-04-24 (×2): qty 1

## 2016-04-24 MED ORDER — LABETALOL HCL 300 MG PO TABS
300.0000 mg | ORAL_TABLET | Freq: Two times a day (BID) | ORAL | Status: DC
  Administered 2016-04-25 (×2): 300 mg via ORAL
  Filled 2016-04-24 (×2): qty 1

## 2016-04-24 NOTE — Progress Notes (Signed)
PROGRESS NOTE    [name] NUSBAUM  YFV:[id number] DOB: [birth year] DOA: 04/22/2016 PCP: [name], MD   Brief Narrative:  82 y.o. WF PMHx Stroke, ICH (intracerebral hemorrhage), COPD, HTN, CAD native artery, Pacemaker-[employer] implanted 2013, Persistent atrial fibrillation, Ascending aorta enlargement , HLD, Medication induced Hepatitis  Presents to the emergency department with the chief complaint right flank pain. Workup in the emergency department reveals sepsis likely related to urinary tract infection in the setting of ureterolithiasis right hydronephrosis.  Information is obtained from the chart and the patient. She states she was in her usual state of health until this morning around 2 AM she got up to use the bathroom developed sudden right flank pain. Associated symptoms include nausea without emesis. She notes that she had right groin pain intermittently over the last several days but it was short lived. She describes the pain as an ache nothing makes it better or worse. She denies any fever chills headache dizziness syncope or near-syncope. She denies any chest pain palpitation shortness of breath lower extremity edema. She denies any dysuria hematuria frequency or urgency. She does report recently taken off amiodarone due to thyroid affects and just finished a prednisone course as treatment   Subjective: 10/11  A/O 4, patient anxious concerning her hematuria and mild jaundice. Negative CP, negative SOB, negative abdominal pain, negative N/V, negative back pain.    Assessment & Plan:   Principal Problem:   Sepsis ([place]) Active Problems:   Essential hypertension   Atrial fibrillation (HCC)   Chronic diastolic heart failure (HCC)   Chronic anticoagulation   Cardiac pacemaker in situ   Elevated LFTs   Hydronephrosis   Renal lithiasis   UTI (urinary tract infection)   Acute kidney injury ([place])   Sepsis secondary to UTI ([place])   Calculus of kidney   Persistent atrial  fibrillation (HCC)   Chronic diastolic CHF (congestive heart failure) ([place])   AAA (abdominal aortic aneurysm) without rupture (HCC)   PMR (polymyalgia rheumatica) (HCC)   Hyperthyroidism   Sepsis due to urolithiasis.  -Will continue antibiotic therapy with ciprofloxacin, will follow on cultures, cell count and temperature curve.   -Per urology will schedule her next week for right ureteroscopic stone extraction with stent removal/exchange.  AKI.  -Suspected to be pre-renal will continue supportive care with IV fluids and antibiotic therapy, avoid hypotension or nephrotoxic medications.  Lab Results  Component Value Date   CREATININE 1.06 (H) 04/25/2016   CREATININE 1.07 (H) 04/24/2016   CREATININE 1.21 (H) 04/23/2016   HTN.  -Labetalol 300 mg BID -Diltiazem 120 mg daily) -Hold Furosemide, Valsartan   Atrial fibrillation.  -Rate controlled  -See HTN -Apixaban.   Heart failure chronic and diastolic.  -See HTN  Aortic Aneurysm -See HTN -Per Dr. Lanelle Bal cardiothoracic surgeon note 4/19 :reviewed the patient'sCT scan, she does have a dilated ascending aorta approximately 4.9 cm maximum diameter without evidence of aortic valve disease. There has been no change over a since 2013  . At this point with her age of 71 years size less then 5.5 cm and index 2.47  I would not recommend elective repair of her ascending aorta as her yearly risk from aortic  rupture is 4%. I've discussed with her the risks of dissection and consequences of that versus the risk of elective repair. She understands a good blood pressure control including beta blockers important. We'll plan to see her back in approximately 8 months with a followup CTA of the aorta to evaluate  any change in size.    CAD. No chest pain  PMR@@@@ -Was on prednisone 20 mg prior to admission but admission note states completed course of steroids patient states still on them?  Elevated LFTs.  -Chronic elevation of liver  enzymes, will follow on LFT in am.  -Started on low dose acetaminophen for pain control.   Hyperthyroidism -Restart Methimazole 10 mg TID       DVT prophylaxis: apixaban  Code Status: full Family Communication: Family at the bedside and all questions were addressed.  Disposition Plan: home     Consultants:  [facility]    Procedures/Significant Events:  10/09- Cystoscopy with insertion of right double-J stent. 10/9 RUQ ultrasound: Negative gallstones-right hydronephrosis-liver normal    Cultures 10/9 blood right hand 2 NGTD 10/9 urine positive multiple species 10/11 urine pending    Antimicrobials: Ciprofloxacin   Devices    LINES / TUBES:      Continuous Infusions:    Objective: Vitals:   04/24/16 2359 04/25/16 0426 04/25/16 0500 04/25/16 0735  BP: (!) 142/84 113/66  108/72  Pulse: 95 96  100  Resp: (!) 26 (!) 27  19  Temp: 99.2 F (37.3 C) 99 F (37.2 C)  97.6 F (36.4 C)  TempSrc: Oral Oral  Oral  SpO2: 94% 95%  95%  Weight:   87.5 kg (192 lb 14.4 oz)   Height:        Intake/Output Summary (Last 24 hours) at 04/25/16 0844 Last data filed at 04/25/16 0010  Gross per 24 hour  Intake             1480 ml  Output                0 ml  Net             1480 ml   Filed Weights   04/23/16 0343 04/24/16 0337 04/25/16 0500  Weight: 87.4 kg (192 lb 10.9 oz) 87.3 kg (192 lb 7.4 oz) 87.5 kg (192 lb 14.4 oz)    Examination:  General: A/O 4, NAD, No acute respiratory distress Eyes: negative scleral hemorrhage, negative anisocoria, negative icterus ENT: Negative Runny nose, negative gingival bleeding, Neck:  Negative scars, masses, torticollis, lymphadenopathy, JVD Lungs: Clear to auscultation bilaterally without wheezes or crackles Cardiovascular: Regular rate and rhythm without murmur gallop or rub normal S1 and S2 Abdomen: negative abdominal pain, nondistended, positive soft, bowel sounds, no rebound, no ascites, no appreciable  mass Extremities: No significant cyanosis, clubbing, or edema bilateral lower extremities Skin: Mild jaundice (per husband significantly improved) Psychiatric:  Negative depression, positive anxiety, negative fatigue, negative mania  Central nervous system:  Cranial nerves II through XII intact, tongue/uvula midline, all extremities muscle strength 5/5, sensation intact throughout, negative dysarthria, negative expressive aphasia, negative receptive aphasia.  .     Data Reviewed: Care during the described time interval was provided by me .  I have reviewed this patient's available data, including medical history, events of note, physical examination, and all test results as part of my evaluation. I have personally reviewed and interpreted all radiology studies.  CBC:  Recent Labs Lab 04/22/16 0640 04/23/16 0403 04/24/16 0348 04/25/16 0525  WBC 9.1 13.5* 10.9* 7.7  NEUTROABS 8.2*  --  8.8* 5.6  HGB 11.9* 9.9* 10.8* 10.3*  HCT 37.7 32.1* 33.6* 32.0*  MCV 93.8 95.0 91.8 92.2  PLT 285 226 246 177   Basic Metabolic Panel:  Recent Labs Lab 04/22/16 0640 04/23/16 0403 04/24/16  0348 04/25/16 0525  NA 139 138 139 138  K 4.1 4.0 4.0 3.9  CL 103 109 109 109  CO2 25 21* 23 23  GLUCOSE 110* 76 93 96  BUN 15 16 10 9   CREATININE 1.24* 1.21* 1.07* 1.06*  CALCIUM 9.9 8.2* 8.6* 8.5*   GFR: Estimated Creatinine Clearance: 45.6 mL/min (by C-G formula based on SCr of 1.06 mg/dL (H)). Liver Function Tests:  Recent Labs Lab 04/22/16 0640 04/23/16 0403 04/24/16 0348 04/25/16 0525  AST 79* 55* 45* 30  ALT 140* 96* 82* 59*  ALKPHOS 282* 200* 210* 183*  BILITOT 1.6* 3.1* 1.9* 1.6*  PROT 6.2* 4.7* 5.1* 4.8*  ALBUMIN 3.0* 2.2* 2.3* 2.1*    Recent Labs Lab 04/22/16 0730  LIPASE 25   No results for input(s): AMMONIA in the last 168 hours. Coagulation Profile:  Recent Labs Lab 04/22/16 1221  INR 1.20   Cardiac Enzymes: No results for input(s): CKTOTAL, CKMB, CKMBINDEX,  TROPONINI in the last 168 hours. BNP (last 3 results) No results for input(s): PROBNP in the last 8760 hours. HbA1C: No results for input(s): HGBA1C in the last 72 hours. CBG: No results for input(s): GLUCAP in the last 168 hours. Lipid Profile: No results for input(s): CHOL, HDL, LDLCALC, TRIG, CHOLHDL, LDLDIRECT in the last 72 hours. Thyroid Function Tests:  Recent Labs  04/24/16 2104  TSH 13.943*   Anemia Panel: No results for input(s): VITAMINB12, FOLATE, FERRITIN, TIBC, IRON, RETICCTPCT in the last 72 hours. Urine analysis:    Component Value Date/Time   COLORURINE RED (A) 04/22/2016 0957   APPEARANCEUR CLOUDY (A) 04/22/2016 0957   LABSPEC 1.024 04/22/2016 0957   PHURINE 7.5 04/22/2016 0957   GLUCOSEU NEGATIVE 04/22/2016 0957   HGBUR LARGE (A) 04/22/2016 0957   BILIRUBINUR NEGATIVE 04/22/2016 0957   KETONESUR NEGATIVE 04/22/2016 0957   PROTEINUR 100 (A) 04/22/2016 0957   UROBILINOGEN 0.2 01/14/2016 1337   NITRITE POSITIVE (A) 04/22/2016 0957   LEUKOCYTESUR LARGE (A) 04/22/2016 0957   Sepsis Labs: @LABRCNTIP (procalcitonin:4,lacticidven:4)  ) Recent Results (from the past 240 hour(s))  Blood Culture (routine x 2)     Status: None (Preliminary result)   Collection Time: 04/22/16  8:12 AM  Result Value Ref Range Status   Specimen Description BLOOD RIGHT HAND  Final   Special Requests BOTTLES DRAWN AEROBIC ONLY  5CC  Final   Culture NO GROWTH 2 DAYS  Final   Report Status PENDING  Incomplete  Blood Culture (routine x 2)     Status: None (Preliminary result)   Collection Time: 04/22/16  9:00 AM  Result Value Ref Range Status   Specimen Description BLOOD RIGHT HAND  Final   Special Requests BOTTLES DRAWN AEROBIC AND ANAEROBIC  5CC  Final   Culture NO GROWTH 2 DAYS  Final   Report Status PENDING  Incomplete  Urine culture     Status: Abnormal   Collection Time: 04/22/16  9:57 AM  Result Value Ref Range Status   Specimen Description URINE, CATHETERIZED  Final    Special Requests NONE  Final   Culture MULTIPLE SPECIES PRESENT, SUGGEST RECOLLECTION (A)  Final   Report Status 04/23/2016 FINAL  Final         Radiology Studies: No results found.      Scheduled Meds: . apixaban  5 mg Oral BID  . ciprofloxacin  400 mg Intravenous Q12H  . diltiazem  120 mg Oral Daily  . hydrocortisone  25 mg Rectal BID  . labetalol  300 mg Oral BID  . sodium chloride flush  3 mL Intravenous Q12H   Continuous Infusions:    LOS: 3 days    Time spent: 40 minutes   Bronislaus Verdell, Geraldo Docker, MD [facility] Pager [phone]   If 7PM-7AM, please contact night-coverage [email or web address] Password [facility] 04/25/2016, 8:44 AM

## 2016-04-25 ENCOUNTER — Telehealth: Payer: Self-pay | Admitting: Internal Medicine

## 2016-04-25 ENCOUNTER — Other Ambulatory Visit: Payer: Self-pay | Admitting: *Deleted

## 2016-04-25 DIAGNOSIS — M353 Polymyalgia rheumatica: Secondary | ICD-10-CM

## 2016-04-25 DIAGNOSIS — N200 Calculus of kidney: Secondary | ICD-10-CM

## 2016-04-25 DIAGNOSIS — N390 Urinary tract infection, site not specified: Secondary | ICD-10-CM

## 2016-04-25 DIAGNOSIS — I4819 Other persistent atrial fibrillation: Secondary | ICD-10-CM

## 2016-04-25 DIAGNOSIS — I2510 Atherosclerotic heart disease of native coronary artery without angina pectoris: Secondary | ICD-10-CM

## 2016-04-25 DIAGNOSIS — I714 Abdominal aortic aneurysm, without rupture, unspecified: Secondary | ICD-10-CM

## 2016-04-25 DIAGNOSIS — E0590 Thyrotoxicosis, unspecified without thyrotoxic crisis or storm: Secondary | ICD-10-CM

## 2016-04-25 DIAGNOSIS — A419 Sepsis, unspecified organism: Secondary | ICD-10-CM

## 2016-04-25 DIAGNOSIS — I480 Paroxysmal atrial fibrillation: Secondary | ICD-10-CM

## 2016-04-25 DIAGNOSIS — E038 Other specified hypothyroidism: Secondary | ICD-10-CM

## 2016-04-25 DIAGNOSIS — E039 Hypothyroidism, unspecified: Secondary | ICD-10-CM

## 2016-04-25 DIAGNOSIS — I5032 Chronic diastolic (congestive) heart failure: Secondary | ICD-10-CM

## 2016-04-25 DIAGNOSIS — N201 Calculus of ureter: Secondary | ICD-10-CM

## 2016-04-25 LAB — CBC WITH DIFFERENTIAL/PLATELET
Basophils Absolute: 0 10*3/uL (ref 0.0–0.1)
Basophils Relative: 0 %
EOS PCT: 2 %
Eosinophils Absolute: 0.2 10*3/uL (ref 0.0–0.7)
HCT: 32 % — ABNORMAL LOW (ref 36.0–46.0)
Hemoglobin: 10.3 g/dL — ABNORMAL LOW (ref 12.0–15.0)
LYMPHS ABS: 0.9 10*3/uL (ref 0.7–4.0)
LYMPHS PCT: 12 %
MCH: 29.7 pg (ref 26.0–34.0)
MCHC: 32.2 g/dL (ref 30.0–36.0)
MCV: 92.2 fL (ref 78.0–100.0)
MONOS PCT: 13 %
Monocytes Absolute: 1 10*3/uL (ref 0.1–1.0)
Neutro Abs: 5.6 10*3/uL (ref 1.7–7.7)
Neutrophils Relative %: 72 %
PLATELETS: 215 10*3/uL (ref 150–400)
RBC: 3.47 MIL/uL — AB (ref 3.87–5.11)
RDW: 19.6 % — ABNORMAL HIGH (ref 11.5–15.5)
WBC: 7.7 10*3/uL (ref 4.0–10.5)

## 2016-04-25 LAB — BLOOD CULTURE ID PANEL (REFLEXED)
Acinetobacter baumannii: NOT DETECTED
CANDIDA GLABRATA: NOT DETECTED
CANDIDA KRUSEI: NOT DETECTED
CANDIDA TROPICALIS: NOT DETECTED
Candida albicans: NOT DETECTED
Candida parapsilosis: NOT DETECTED
Carbapenem resistance: NOT DETECTED
ESCHERICHIA COLI: NOT DETECTED
Enterobacter cloacae complex: NOT DETECTED
Enterobacteriaceae species: DETECTED — AB
Enterococcus species: NOT DETECTED
Haemophilus influenzae: NOT DETECTED
KLEBSIELLA OXYTOCA: NOT DETECTED
Klebsiella pneumoniae: NOT DETECTED
Listeria monocytogenes: NOT DETECTED
NEISSERIA MENINGITIDIS: NOT DETECTED
PROTEUS SPECIES: DETECTED — AB
Pseudomonas aeruginosa: NOT DETECTED
SERRATIA MARCESCENS: NOT DETECTED
STAPHYLOCOCCUS SPECIES: NOT DETECTED
Staphylococcus aureus (BCID): NOT DETECTED
Streptococcus agalactiae: NOT DETECTED
Streptococcus pneumoniae: NOT DETECTED
Streptococcus pyogenes: NOT DETECTED
Streptococcus species: NOT DETECTED

## 2016-04-25 LAB — COMPREHENSIVE METABOLIC PANEL
ALK PHOS: 183 U/L — AB (ref 38–126)
ALT: 59 U/L — AB (ref 14–54)
ANION GAP: 6 (ref 5–15)
AST: 30 U/L (ref 15–41)
Albumin: 2.1 g/dL — ABNORMAL LOW (ref 3.5–5.0)
BILIRUBIN TOTAL: 1.6 mg/dL — AB (ref 0.3–1.2)
BUN: 9 mg/dL (ref 6–20)
CALCIUM: 8.5 mg/dL — AB (ref 8.9–10.3)
CO2: 23 mmol/L (ref 22–32)
CREATININE: 1.06 mg/dL — AB (ref 0.44–1.00)
Chloride: 109 mmol/L (ref 101–111)
GFR calc non Af Amer: 48 mL/min — ABNORMAL LOW (ref >60)
GFR, EST AFRICAN AMERICAN: 55 mL/min — AB (ref >60)
Glucose, Bld: 96 mg/dL (ref 65–99)
Potassium: 3.9 mmol/L (ref 3.5–5.1)
Sodium: 138 mmol/L (ref 135–145)
Total Protein: 4.8 g/dL — ABNORMAL LOW (ref 6.5–8.1)

## 2016-04-25 LAB — URINE CULTURE

## 2016-04-25 MED ORDER — CIPROFLOXACIN HCL 500 MG PO TABS
500.0000 mg | ORAL_TABLET | Freq: Two times a day (BID) | ORAL | Status: DC
  Administered 2016-04-25: 500 mg via ORAL
  Filled 2016-04-25: qty 1

## 2016-04-25 MED ORDER — CIPROFLOXACIN HCL 500 MG PO TABS: 500.0000 mg | ORAL_TABLET | Freq: Two times a day (BID) | ORAL | 0 refills | Status: DC

## 2016-04-25 MED ORDER — PREDNISONE 20 MG PO TABS
20.0000 mg | ORAL_TABLET | Freq: Every day | ORAL | Status: DC
Start: 2016-04-25 — End: 2016-04-25
  Administered 2016-04-25: 20 mg via ORAL
  Filled 2016-04-25: qty 1

## 2016-04-25 MED ORDER — HYDROCORTISONE ACETATE 25 MG RE SUPP: 25.0000 mg | Freq: Two times a day (BID) | RECTAL | 0 refills | Status: DC

## 2016-04-25 MED ORDER — PREDNISONE 10 MG PO TABS: ORAL_TABLET | ORAL | 0 refills | Status: DC

## 2016-04-25 MED ORDER — PREDNISONE 20 MG PO TABS: 20.0000 mg | ORAL_TABLET | Freq: Every day | ORAL | 0 refills | Status: DC

## 2016-04-25 NOTE — Telephone Encounter (Signed)
I called and spoke with the patient. She was discharged from the hospital today for a kidney stone. Her lasix/ potassium/ valsartan were stopped. She questioned her dose of prednisone and what to do with that. I advised her that earlier today, I sent in her next dose of 10 mg once daily on her prednisone as we are titrating this down for her. I have advised her that due to the situation with her kidney stone, she should take her medications as prescribed and schedule a follow up with her PCP. She is due to see [place] back next week. She will finish her 20 mg dose of prednisone on Sunday and start the 10 mg dose. However, in reviewing the patient's labs, her TSH is now 13.943 as of yesterday. I will review this with Dr. Caryl Comes to see is any adjustments need to be made to her prednisone/methimizole prior to her follow up. She is agreeable and voices understanding.

## 2016-04-25 NOTE — Telephone Encounter (Signed)
Pt just released from the hospital and most of her medications have change she would like to know why. Please call pt . Thanks

## 2016-04-25 NOTE — Discharge Summary (Addendum)
Physician Discharge Summary  [name] [name] MGQ:[id number] DOB: 1930-08-13 DOA: 04/22/2016  PCP: [name] Gravel, MD  Admit date: 04/22/2016 Discharge date: 04/25/2016  Time spent: 35 minutes  Recommendations for Outpatient Follow-up:  Sepsis due to urolithiasis.  -Will continue antibiotic therapy with ciprofloxacin, will follow on cultures, cell count and temperature curve.  -Per urology Schedule Follow-up with Dr. Irine [name] for removal of right double-J stent secondary to urolithiasis in 1 week Scheduled follow-up for 7 days with Dr. Jani [name] for complicated UTI, hypertension, elevated LFTs. Check LFT levels -Continue ciprofloxacin tentative stop date 10/ 24  AKI.  -Suspected to be pre-renal will continue supportive care with IV fluids and antibiotic therapy, avoid hypotension or nephrotoxic medications.  Recent Labs       Lab Results  Component Value Date   CREATININE 1.06 (H) 04/25/2016   CREATININE 1.07 (H) 04/24/2016   CREATININE 1.21 (H) 04/23/2016     Bacteremia positive Proteus/,Enterobacteriaceae -Spoke with pharmacy should be covered by current antibiotic will extend length of treatment for 2 weeks. Tentative stop date 10/ 24  HTN.  -Labetalol 300 mg BID -Diltiazem 120 mg daily) -Hold Furosemide, Valsartan: PCP to restart as appropriate   Atrial fibrillation.  -Rate controlled  -See HTN -Apixaban.   Heart failure chronic and diastolic.  -See HTN  Aortic Aneurysm -See HTN -Per Dr. Lanelle [name] cardiothoracic surgeon note 4/19 :reviewed the patient'sCT scan, she does have a dilated ascending aorta approximately 4.9 cm maximum diameter without evidence of aortic valve disease. There has been no change over a since 2013 . At this point with her age of 68 years size less then 5.5 cm and index 2.47 I would not recommend elective repair of her ascending aorta as her yearly risk from aortic rupture is 4%. I've discussed with her the risks of dissection and  consequences of that versus the risk of elective repair. She understands a good blood pressure control including beta blockers important. Will plan to see her back in approximately 8 months with a followup CTA of the aorta to evaluate any change in size.   CAD. No chest pain  Elevated LFTs.  -Chronic elevation of liver enzymes, trending down PCP to follow.  Hyperthyroidism -Restart Methimazole 10 mg TID and prednisone 20 mg (review of chart shows cardiology started steroids)     Discharge Diagnoses:  Principal Problem:   Sepsis ([place]) Active Problems:   Essential hypertension   Atrial fibrillation (HCC)   Chronic diastolic heart failure (HCC)   Chronic anticoagulation   Cardiac pacemaker in situ   Elevated LFTs   Hydronephrosis   Renal lithiasis   UTI (urinary tract infection)   Acute kidney injury ([name] [name])   Sepsis secondary to UTI (HCC)   Calculus of kidney   Persistent atrial fibrillation (HCC)   Chronic diastolic CHF (congestive heart failure) (HCC)   AAA (abdominal aortic aneurysm) without rupture (HCC)   PMR (polymyalgia rheumatica) (HCC)   Hyperthyroidism   Calculus of ureter   Paroxysmal atrial fibrillation (HCC)   CAD in native artery   Other specified hypothyroidism   Discharge Condition: Stable  Diet recommendation: Heart healthy  Filed Weights   04/23/16 0343 04/24/16 0337 04/25/16 0500  Weight: 87.4 kg (192 lb 10.9 oz) 87.3 kg (192 lb 7.4 oz) 87.5 kg (192 lb 14.4 oz)    History of present illness:  82 y.o.WF PMHx Stroke, ICH (intracerebral hemorrhage), COPD, HTN, CAD native artery, Pacemaker-[employer] implanted 2013, Persistent atrial fibrillation, Ascending aorta enlargement , HLD,  Medication induced Hepatitis  Presents to the emergency department with the chief complaint right flank pain. Workup in the emergency department reveals sepsis likely related to urinary tract infection in the setting of ureterolithiasis right  hydronephrosis.  Information is obtained from the chart and the patient. She states she was in her usual state of health until this morning around 2 AM she got up to use the bathroom developed sudden right flank pain. Associated symptoms include nausea without emesis. She notes that she had right groin pain intermittently over the last several days but it was short lived. She describes the pain as an ache nothing makes it better or worse. She denies any fever chills headache dizziness syncope or near-syncope. She denies any chest pain palpitation shortness of breath lower extremity edema. She denies any dysuria hematuria frequency or urgency. She does report recently taken off amiodarone due to thyroid affects and just finished a prednisone course as treatment During his hospitalization patient was treated for sepsis UTI secondary to urolithiasis. Patient received a right double-J stent via cystoscopy to relieve right hydronephrosis. This resolved patient's pain and her acute renal failure in process of resolving. Urine Culture never brought specific organism, will treat patient for 7 days for complicated UTI with ciprofloxacin. Patient's had acute on chronic elevated liver enzymes, which resolving. Abdominal ultrasound was negative for gallstones and liver parenchyma WNL. Patient will follow-up with PCP to monitor continuation of resolution of liver enzymes.    Consultants:  Dr.[facility]    Procedures/Significant Events:  10/09- Cystoscopy with insertion of right double-J stent. 10/9 RUQ ultrasound: Negative gallstones-right hydronephrosis-liver normal    Cultures 10/9 blood right hand positive Proteus/,Enterobacteriaceae 10/9 urine positive multiple species 10/11 urine pending    Antimicrobials: Ciprofloxacin 10/9>> tentative stop date 10/ 24   Discharge Exam: Vitals:   04/25/16 0426 04/25/16 0500 04/25/16 0735 04/25/16 1104  BP: 113/66  108/72 (!) 98/59  Pulse:  96  100 74  Resp: (!) 27  19 14   Temp: 99 F (37.2 C)  97.6 F (36.4 C) 98 F (36.7 C)  TempSrc: Oral  Oral Oral  SpO2: 95%  95% 95%  Weight:  87.5 kg (192 lb 14.4 oz)    Height:        General: A/O 4, NAD, No acute respiratory distress Eyes: negative scleral hemorrhage, negative anisocoria, negative icterus ENT: Negative Runny nose, negative gingival bleeding, Neck:  Negative scars, masses, torticollis, lymphadenopathy, JVD Lungs: Clear to auscultation bilaterally without wheezes or crackles Cardiovascular: Regular rate and rhythm without murmur gallop or rub normal S1 and S2 Abdomen: negative abdominal pain, nondistended, positive soft, bowel sounds, no rebound, no ascites, no appreciable mass  Discharge Instructions     Medication List    STOP taking these medications   furosemide 20 MG tablet Commonly known as:  LASIX   potassium chloride 10 MEQ tablet Commonly known as:  K-DUR   valsartan 320 MG tablet Commonly known as:  DIOVAN     TAKE these medications   apixaban 5 MG Tabs tablet Commonly known as:  ELIQUIS Take 1 tablet (5 mg total) by mouth 2 (two) times daily.   CALTRATE 600+D PO Take 1 tablet by mouth 2 (two) times daily.   cetirizine 10 MG tablet Commonly known as:  ZYRTEC Take 10 mg by mouth daily as needed for allergies.   cholecalciferol 1000 units tablet Commonly known as:  VITAMIN D Take 1,000 Units by mouth at bedtime.   ciprofloxacin 500 MG  tablet Commonly known as:  CIPRO Take 1 tablet (500 mg total) by mouth 2 (two) times daily.   diltiazem 120 MG 24 hr capsule Commonly known as:  CARDIZEM CD Take 1 capsule (120 mg total) by mouth daily.   hydrocortisone 25 MG suppository Commonly known as:  ANUSOL-HC Place 1 suppository (25 mg total) rectally 2 (two) times daily.   labetalol 300 MG tablet Commonly known as:  NORMODYNE TAKE ONE BY MOUTH TWO TIMES A DAY.   methimazole 10 MG tablet Commonly known as:  TAPAZOLE Take 1 tablet  (10 mg total) by mouth 3 (three) times daily.   multivitamin per tablet Take 1 tablet by mouth at bedtime.   WELCHOL 625 MG tablet Generic drug:  colesevelam Take 625-1,250 mg by mouth 2 (two) times daily. Take 625 mg every morning and 1250 mg at bedtime.      Allergies  Allergen Reactions  . Penicillins Anaphylaxis    Has patient had a PCN reaction causing immediate rash, facial/tongue/throat swelling, SOB or lightheadedness with hypotension: Yes Has patient had a PCN reaction causing severe rash involving mucus membranes or skin necrosis: No Has patient had a PCN reaction that required hospitalization No Has patient had a PCN reaction occurring within the last 10 years: No If all of the above answers are "NO", then may proceed with Cephalosporin use.   . Statins Other (See Comments)    REACTION: elevated LFT's  . Tylenol [Acetaminophen] Other (See Comments)    If taken with Vytorin at risk for liver damage  . Tape Other (See Comments)    Redness, Please use "paper" tape only.  . Codeine Other (See Comments)    feel funny, head is fuzzy   Follow-up Information    [name] Gravel, MD Follow up in 1 week(s).   Specialty:  Internal Medicine Why:  Scheduled follow-up for 7 days with Dr. Jani [name] for complicated UTI, hypertension, elevated LFTs. Check LFT levels Contact information: [address] [place] [address] [phone]        Malka So, MD. Schedule an appointment as soon as possible for a visit in 1 week(s).   Specialty:  Urology Why:  Scheduled Follow-up with Dr. Irine [name] for removal of right double-J stent secondary to urolithiasis Contact information: [place] [address] [phone]            The results of significant diagnostics from this hospitalization (including imaging, microbiology, ancillary and laboratory) are listed below for reference.    Significant Diagnostic Studies: Ct Abdomen Pelvis W  Contrast  Result Date: 04/22/2016 CLINICAL DATA:  Right flank pain and nausea and vomiting starting at 2 a.m. EXAM: CT ABDOMEN AND PELVIS WITH CONTRAST TECHNIQUE: Multidetector CT imaging of the abdomen and pelvis was performed using the standard protocol following bolus administration of intravenous contrast. CONTRAST:  75 cc Isovue 300 COMPARISON:  Abdominal radiograph 04/08/2016. Abdominal ultrasound 11/28/2006. FINDINGS: Lower chest: Bandlike scarring or atelectasis in the right lower lobe. 2 mm nodule, right middle lobe, image 8/3. Aortic valve calcification and right coronary artery calcification. Pacer leads noted. Mild cardiomegaly. Hepatobiliary: Punctate calcification along the inferior capsular margin of the right hepatic lobe on image 34/2, likely incidental. Gallbladder unremarkable. Pancreas: Unremarkable Spleen: Unremarkable Adrenals/Urinary Tract: Adrenal glands normal. Right perirenal stranding and delayed nephrogram minutes delayed excretion of contrast to the right kidney the associated with mild moderate right hydronephrosis and moderate right hydroureter extending down to a 2 mm right ureterovesical junction  calculus on image 76/2. There is a 1 mm upper pole nonobstructive right renal calculus on image 94/5. No other renal calculi identified. Urinary bladder normal. Exophytic from the right kidney upper pole posteriorly there is a 1.4 by 1.2 by 1.1 cm lesion measured at 21 Hounsfield units on portal venous phase images and 20 Hounsfield units on delayed phase images. However, density measurement may be septic to inaccuracy due to volume averaging. Looking back to the prior chest CT of 08/13/2011, this lesion was of a similar size and density. Fluid in the gallbladder measures 12 Hounsfield units today. Exaggerated anterior-posterior long axis of the right kidney. Stomach/Bowel: Redundant sigmoid colon. Vascular/Lymphatic: Aortoiliac atherosclerotic vascular disease. Fusiform infrarenal ectasia  up to 2.7 cm, without overt aneurysm. No pathologic adenopathy identified. Reproductive: Unremarkable Other: Trace free fluid in the right perirectal space, image 70/2. Musculoskeletal: Slightly transitional S1 segment. Otherwise unremarkable. IMPRESSION: 1. 2 mm right UVJ calculus associated with moderate right hydroureter, mild to moderate right hydronephrosis, delayed enhancement of the right kidney, delayed excretion of the right kidney, and right perinephric stranding. There is also a separate 1 mm right kidney upper pole nonobstructive renal calculus. 2. Mildly complex right kidney upper pole lesion measuring up to 1.4 cm, no change in size or apparent complexity compared to 1/20 03/2012. Most likely this is a complex cyst. I do not have precontrast imaging available to directly assess for enhancement and assigned a Bosniak classification. 3. Vascular findings include aortic valve calcification, right coronary artery calcification, aortoiliac atherosclerotic calcification, and fusiform infrarenal ectasia up to 2.7 cm without overt aneurysm. 4. Trace right perirectal fluid, fairly close in position to the mildly dilated right ureter and potentially related to the ureteral inflammation/obstruction. 5. Mild cardiomegaly. Electronically Signed   By: Van Clines M.D.   On: 04/22/2016 09:29   Dg Retrograde Pyelogram  Result Date: 04/22/2016 CLINICAL DATA:  Stone at the right ureterovesical junction. EXAM: RETROGRADE PYELOGRAM COMPARISON:  CT 04/22/2016 FINDINGS: Right ureter was cannulated and a wire was advanced into the right renal pelvis. Stone or filling defect is not confidently seen on these images. A double-J ureteral stent was placed. Proximal aspect of stent is near the upper pole calyx. Distal aspect of stent in the expected region of the bladder. IMPRESSION: Placement of right ureter stent. Electronically Signed   By: Markus Daft M.D.   On: 04/22/2016 13:57   US Abdomen Limited  Result Date:  04/22/2016 CLINICAL DATA:  Right upper quadrant pain elevated LFT EXAM: US ABDOMEN LIMITED - RIGHT UPPER QUADRANT COMPARISON:  CT abdomen pelvis 04/22/2016 FINDINGS: Gallbladder: No gallstones or wall thickening visualized. No sonographic Murphy sign noted by sonographer. Common bile duct: Diameter: 4.2 mm Liver: No focal lesion identified. Within normal limits in parenchymal echogenicity. Mild right hydronephrosis. IMPRESSION: Negative for gallstones Right hydronephrosis as noted on CT. Electronically Signed   By: Franchot Gallo M.D.   On: 04/22/2016 10:00    Microbiology: Recent Results (from the past 240 hour(s))  Blood Culture (routine x 2)     Status: None (Preliminary result)   Collection Time: 04/22/16  8:12 AM  Result Value Ref Range Status   Specimen Description BLOOD RIGHT HAND  Final   Special Requests BOTTLES DRAWN AEROBIC ONLY  5CC  Final   Culture NO GROWTH 2 DAYS  Final   Report Status PENDING  Incomplete  Blood Culture (routine x 2)     Status: None (Preliminary result)   Collection Time: 04/22/16  9:00 AM  Result Value Ref Range Status   Specimen Description BLOOD RIGHT HAND  Final   Special Requests BOTTLES DRAWN AEROBIC AND ANAEROBIC  5CC  Final   Culture  Setup Time   Final    GRAM NEGATIVE RODS AEROBIC BOTTLE ONLY Organism ID to follow CRITICAL RESULT CALLED TO, READ BACK BY AND VERIFIED WITH: Hughie Closs Pharm.D. 11:00 04/25/16 ([email or web address])    Culture PENDING  Incomplete   Report Status PENDING  Incomplete  Blood Culture ID Panel (Reflexed)     Status: Abnormal   Collection Time: 04/22/16  9:00 AM  Result Value Ref Range Status   Enterococcus species NOT DETECTED NOT DETECTED Final   Listeria monocytogenes NOT DETECTED NOT DETECTED Final   Staphylococcus species NOT DETECTED NOT DETECTED Final   Staphylococcus aureus NOT DETECTED NOT DETECTED Final   Streptococcus species NOT DETECTED NOT DETECTED Final   Streptococcus agalactiae NOT DETECTED NOT DETECTED Final    Streptococcus pneumoniae NOT DETECTED NOT DETECTED Final   Streptococcus pyogenes NOT DETECTED NOT DETECTED Final   Acinetobacter baumannii NOT DETECTED NOT DETECTED Final   Enterobacteriaceae species DETECTED (A) NOT DETECTED Final    Comment: CRITICAL RESULT CALLED TO, READ BACK BY AND VERIFIED WITH: Hughie Closs Pharm.D. 11:00 04/25/16 ([email or web address])    Enterobacter cloacae complex NOT DETECTED NOT DETECTED Final   Escherichia coli NOT DETECTED NOT DETECTED Final   Klebsiella oxytoca NOT DETECTED NOT DETECTED Final   Klebsiella pneumoniae NOT DETECTED NOT DETECTED Final   Proteus species DETECTED (A) NOT DETECTED Final    Comment: CRITICAL RESULT CALLED TO, READ BACK BY AND VERIFIED WITH: Hughie Closs Pharm.D. 11:00 04/25/16 ([email or web address])    Serratia marcescens NOT DETECTED NOT DETECTED Final   Carbapenem resistance NOT DETECTED NOT DETECTED Final   Haemophilus influenzae NOT DETECTED NOT DETECTED Final   Neisseria meningitidis NOT DETECTED NOT DETECTED Final   Pseudomonas aeruginosa NOT DETECTED NOT DETECTED Final   Candida albicans NOT DETECTED NOT DETECTED Final   Candida glabrata NOT DETECTED NOT DETECTED Final   Candida krusei NOT DETECTED NOT DETECTED Final   Candida parapsilosis NOT DETECTED NOT DETECTED Final   Candida tropicalis NOT DETECTED NOT DETECTED Final  Urine culture     Status: Abnormal   Collection Time: 04/22/16  9:57 AM  Result Value Ref Range Status   Specimen Description URINE, CATHETERIZED  Final   Special Requests NONE  Final   Culture MULTIPLE SPECIES PRESENT, SUGGEST RECOLLECTION (A)  Final   Report Status 04/23/2016 FINAL  Final  Culture, Urine     Status: Abnormal   Collection Time: 04/24/16  3:57 PM  Result Value Ref Range Status   Specimen Description URINE, RANDOM  Final   Special Requests NONE  Final   Culture MULTIPLE SPECIES PRESENT, SUGGEST RECOLLECTION (A)  Final   Report Status 04/25/2016 FINAL  Final     Labs: Basic Metabolic Panel:  Recent  Labs Lab 04/22/16 0640 04/23/16 0403 04/24/16 0348 04/25/16 0525  NA 139 138 139 138  K 4.1 4.0 4.0 3.9  CL 103 109 109 109  CO2 25 21* 23 23  GLUCOSE 110* 76 93 96  BUN 15 16 10 9   CREATININE 1.24* 1.21* 1.07* 1.06*  CALCIUM 9.9 8.2* 8.6* 8.5*   Liver Function Tests:  Recent Labs Lab 04/22/16 0640 04/23/16 0403 04/24/16 0348 04/25/16 0525  AST 79* 55* 45* 30  ALT 140* 96* 82* 59*  ALKPHOS 282* 200* 210* 183*  BILITOT 1.6* 3.1* 1.9* 1.6*  PROT  6.2* 4.7* 5.1* 4.8*  ALBUMIN 3.0* 2.2* 2.3* 2.1*    Recent Labs Lab 04/22/16 0730  LIPASE 25   No results for input(s): AMMONIA in the last 168 hours. CBC:  Recent Labs Lab 04/22/16 0640 04/23/16 0403 04/24/16 0348 04/25/16 0525  WBC 9.1 13.5* 10.9* 7.7  NEUTROABS 8.2*  --  8.8* 5.6  HGB 11.9* 9.9* 10.8* 10.3*  HCT 37.7 32.1* 33.6* 32.0*  MCV 93.8 95.0 91.8 92.2  PLT 285 226 246 215   Cardiac Enzymes: No results for input(s): CKTOTAL, CKMB, CKMBINDEX, TROPONINI in the last 168 hours. BNP: BNP (last 3 results) No results for input(s): BNP in the last 8760 hours.  ProBNP (last 3 results) No results for input(s): PROBNP in the last 8760 hours.  CBG: No results for input(s): GLUCAP in the last 168 hours.     Signed:  Dia Crawford, MD [facility] [phone] pager

## 2016-04-25 NOTE — Progress Notes (Signed)
Patient to be discharged to home with spouse. PIV removed per protocol. Left arm remains ecchymotic and swollen, but this is unchanged from baseline. Discharge instructions, medications, and follow up appts reviewed with patient and patient's spouse. Prescriptions and all belongings sent with patient. Family to provide transportation home.  Joellen Jersey, RN.

## 2016-04-25 NOTE — Telephone Encounter (Signed)
Lets make prednisone 10 mg for 2 weeks then 5 mg And lets decrease  Methimazole  To 5 bid And recheck in 4 weeks

## 2016-04-25 NOTE — Progress Notes (Signed)
PHARMACY - PHYSICIAN COMMUNICATION CRITICAL VALUE ALERT - BLOOD CULTURE IDENTIFICATION (BCID)  Results for orders placed or performed during the hospital encounter of 04/22/16  Blood Culture ID Panel (Reflexed) (Collected: 04/22/2016  9:00 AM)  Result Value Ref Range   Enterococcus species NOT DETECTED NOT DETECTED   Listeria monocytogenes NOT DETECTED NOT DETECTED   Staphylococcus species NOT DETECTED NOT DETECTED   Staphylococcus aureus NOT DETECTED NOT DETECTED   Streptococcus species NOT DETECTED NOT DETECTED   Streptococcus agalactiae NOT DETECTED NOT DETECTED   Streptococcus pneumoniae NOT DETECTED NOT DETECTED   Streptococcus pyogenes NOT DETECTED NOT DETECTED   Acinetobacter baumannii NOT DETECTED NOT DETECTED   Enterobacteriaceae species DETECTED (A) NOT DETECTED   Enterobacter cloacae complex NOT DETECTED NOT DETECTED   Escherichia coli NOT DETECTED NOT DETECTED   Klebsiella oxytoca NOT DETECTED NOT DETECTED   Klebsiella pneumoniae NOT DETECTED NOT DETECTED   Proteus species DETECTED (A) NOT DETECTED   Serratia marcescens NOT DETECTED NOT DETECTED   Carbapenem resistance NOT DETECTED NOT DETECTED   Haemophilus influenzae NOT DETECTED NOT DETECTED   Neisseria meningitidis NOT DETECTED NOT DETECTED   Pseudomonas aeruginosa NOT DETECTED NOT DETECTED   Candida albicans NOT DETECTED NOT DETECTED   Candida glabrata NOT DETECTED NOT DETECTED   Candida krusei NOT DETECTED NOT DETECTED   Candida parapsilosis NOT DETECTED NOT DETECTED   Candida tropicalis NOT DETECTED NOT DETECTED    Name of physician (or Provider) Contacted: Dr Sherral Hammers  Changes to prescribed antibiotics required: None  Levester Fresh, PharmD, BCPS, [facility] Clinical Pharmacist Pager [phone] 04/25/2016 12:49 PM

## 2016-04-25 NOTE — Care Management Note (Signed)
Case Management Note  Patient Details  Name: [name] [name] MRN: [id number] Date of Birth: 1930/09/03  Subjective/Objective:    Presents with sepsis,uti, pod 1 for cystoscopy and right stent insertion for right distal stone.  PTA indep, lives with spouse, has PCP [name], she has medication coverage and transportation at discharge. She is for dc today.                Action/Plan:   Expected Discharge Date:                  Expected Discharge Plan:  Home/Self Care  In-House Referral:     Discharge planning Services  CM Consult  Post Acute Care Choice:    Choice offered to:     DME Arranged:    DME Agency:     HH Arranged:    HH Agency:     Status of Service:  Completed, signed off  If discussed at [employer] of Stay Meetings, dates discussed:    Additional Comments:  Zenon Mayo, RN 04/25/2016, 11:41 AM

## 2016-04-26 ENCOUNTER — Encounter (HOSPITAL_COMMUNITY): Payer: Self-pay | Admitting: *Deleted

## 2016-04-27 LAB — CULTURE, BLOOD (ROUTINE X 2): Culture: NO GROWTH

## 2016-04-28 LAB — CULTURE, BLOOD (ROUTINE X 2)

## 2016-04-29 ENCOUNTER — Encounter (HOSPITAL_COMMUNITY): Payer: Self-pay | Admitting: Emergency Medicine

## 2016-04-29 ENCOUNTER — Emergency Department (HOSPITAL_COMMUNITY)
Admission: EM | Admit: 2016-04-29 | Discharge: 2016-04-30 | Disposition: A | Discharge status: Home / Self Care | Payer: Medicare Other | Source: Home / Self Care | Attending: Emergency Medicine | Admitting: Emergency Medicine

## 2016-04-29 DIAGNOSIS — Z820 Family history of epilepsy and other diseases of the nervous system: Secondary | ICD-10-CM | POA: Diagnosis not present

## 2016-04-29 DIAGNOSIS — Z87891 Personal history of nicotine dependence: Secondary | ICD-10-CM | POA: Insufficient documentation

## 2016-04-29 DIAGNOSIS — I481 Persistent atrial fibrillation: Secondary | ICD-10-CM | POA: Diagnosis not present

## 2016-04-29 DIAGNOSIS — R300 Dysuria: Secondary | ICD-10-CM | POA: Insufficient documentation

## 2016-04-29 DIAGNOSIS — Z8249 Family history of ischemic heart disease and other diseases of the circulatory system: Secondary | ICD-10-CM | POA: Diagnosis not present

## 2016-04-29 DIAGNOSIS — Z79899 Other long term (current) drug therapy: Secondary | ICD-10-CM | POA: Insufficient documentation

## 2016-04-29 DIAGNOSIS — N201 Calculus of ureter: Secondary | ICD-10-CM | POA: Diagnosis not present

## 2016-04-29 DIAGNOSIS — Z7901 Long term (current) use of anticoagulants: Secondary | ICD-10-CM | POA: Insufficient documentation

## 2016-04-29 DIAGNOSIS — Z91048 Other nonmedicinal substance allergy status: Secondary | ICD-10-CM | POA: Diagnosis not present

## 2016-04-29 DIAGNOSIS — Q438 Other specified congenital malformations of intestine: Secondary | ICD-10-CM | POA: Diagnosis not present

## 2016-04-29 DIAGNOSIS — Z888 Allergy status to other drugs, medicaments and biological substances status: Secondary | ICD-10-CM | POA: Diagnosis not present

## 2016-04-29 DIAGNOSIS — Z950 Presence of cardiac pacemaker: Secondary | ICD-10-CM | POA: Insufficient documentation

## 2016-04-29 DIAGNOSIS — I119 Hypertensive heart disease without heart failure: Secondary | ICD-10-CM | POA: Diagnosis not present

## 2016-04-29 DIAGNOSIS — Z8673 Personal history of transient ischemic attack (TIA), and cerebral infarction without residual deficits: Secondary | ICD-10-CM

## 2016-04-29 DIAGNOSIS — Z87440 Personal history of urinary (tract) infections: Secondary | ICD-10-CM | POA: Diagnosis not present

## 2016-04-29 DIAGNOSIS — M353 Polymyalgia rheumatica: Secondary | ICD-10-CM | POA: Diagnosis not present

## 2016-04-29 DIAGNOSIS — I5032 Chronic diastolic (congestive) heart failure: Secondary | ICD-10-CM | POA: Insufficient documentation

## 2016-04-29 DIAGNOSIS — E785 Hyperlipidemia, unspecified: Secondary | ICD-10-CM | POA: Diagnosis not present

## 2016-04-29 DIAGNOSIS — E039 Hypothyroidism, unspecified: Secondary | ICD-10-CM | POA: Insufficient documentation

## 2016-04-29 DIAGNOSIS — I110 Hypertensive heart disease with heart failure: Secondary | ICD-10-CM

## 2016-04-29 DIAGNOSIS — R319 Hematuria, unspecified: Secondary | ICD-10-CM | POA: Insufficient documentation

## 2016-04-29 DIAGNOSIS — Z87892 Personal history of anaphylaxis: Secondary | ICD-10-CM | POA: Diagnosis not present

## 2016-04-29 DIAGNOSIS — Z885 Allergy status to narcotic agent status: Secondary | ICD-10-CM | POA: Diagnosis not present

## 2016-04-29 DIAGNOSIS — Z9889 Other specified postprocedural states: Secondary | ICD-10-CM | POA: Diagnosis not present

## 2016-04-29 DIAGNOSIS — I2510 Atherosclerotic heart disease of native coronary artery without angina pectoris: Secondary | ICD-10-CM

## 2016-04-29 DIAGNOSIS — Z880 Allergy status to penicillin: Secondary | ICD-10-CM | POA: Diagnosis not present

## 2016-04-29 MED ORDER — METHIMAZOLE 5 MG PO TABS: ORAL_TABLET | ORAL | 3 refills | Status: DC

## 2016-04-29 NOTE — ED Notes (Signed)
Bed: ML54 Expected date:  Expected time:  Means of arrival:  Comments: 82 yo F/ urinary retention

## 2016-04-29 NOTE — Telephone Encounter (Signed)
I called and spoke with the patient. She is aware to decrease prednisone to 10 mg daily as previously directed, but for 2 weeks instead of a full month and to decrease methimazole to 5 mg BID. She has follow up with Dr. Caryl Comes on 05/13/16- we can readdress her thyroid at that time.

## 2016-04-29 NOTE — ED Provider Notes (Signed)
[facility] DEPT Provider Note   CSN: [id number] Arrival date & time: 04/29/16  2237 By signing my name below, I, Dyke Brackett, attest that this documentation has been prepared under the direction and in the presence of non-physician practitioner, Delsa Grana, PA-C Electronically Signed: Dyke Brackett, Scribe. 04/29/2016. 11:56 PM.   History   Chief Complaint Chief Complaint  Patient presents with  . Urinary Retention  . Abdominal Pain    HPI [name] [name] is a 82 y.o. female with hx of  brought in by ambulance who presents to the Emergency Department complaining of urinary retention onset this morning. She also notes associated suprapubic pain, pelvic pain, dysuria, and hematuria. Pt states the pain worsened throughout the day, but has improved since coming to the ED. She currently rates her pain as a 3/10, but was 10/10 before coming to the ED tonight. Pt was seen last week in the ED for a kidney stone and had a stent placed. She is followed by Dr. Jeffie Pollock and has appointment in the morning to remove the stent. Pt is able to eat and drink normally. She denies any nausea, vomiting, diaphoresis, fever, flank pain or back pain.   The history is provided by the patient. No language interpreter was used.   Past Medical History:  Diagnosis Date  . Ascending aorta enlargement (HCC)    4.8 cm per echo 08/07/11; 5.0 by CT in Jan 2013  . CAD (coronary artery disease)    LHC 5/08:  pOM 20%, pRCA 20-30%, EF 60%  . Hemorrhoids    current problem as of 04/26/16  with slight bleeding of hemorrhoids   . Hepatitis    hx of medication induced hepatitis (Vytorin per pt)  . History of loop recorder   . HLD (hyperlipidemia)   . HTN (hypertension)   . Hx of echocardiogram    echo 1/13: EF 55%, Gr 2 diast dysfn, Asc Ao aneurysm 4.8 cm, mild MR, mod LAE, mild RAE  . Hypothyroidism   . ICH (intracerebral hemorrhage) ([place]) 03/24/15  . NASH (nonalcoholic steatohepatitis)   . Neuromuscular disorder  ([place])   . Pacemaker-Medtronic 11/12/2011   Implanted 2013   . Persistent atrial fibrillation ([place])   . PMR (polymyalgia rheumatica) (HCC)    no steriods for 5 years  . sinus node dysfunction//post termination pauses   . Stroke ([place])   . Urinary tract infection    Recurrent infections.    Patient Active Problem List   Diagnosis Date Noted  . Sepsis secondary to UTI ([place])   . Calculus of kidney   . Persistent atrial fibrillation ([place])   . Chronic diastolic CHF (congestive heart failure) ([place])   . AAA (abdominal aortic aneurysm) without rupture ([place])   . PMR (polymyalgia rheumatica) (HCC)   . Hyperthyroidism   . Calculus of ureter   . Paroxysmal atrial fibrillation (HCC)   . CAD in native artery   . Other specified hypothyroidism   . Sepsis ([place]) 04/22/2016  . Elevated LFTs 04/22/2016  . Hydronephrosis 04/22/2016  . Renal lithiasis 04/22/2016  . UTI (urinary tract infection) 04/22/2016  . Acute kidney injury ([place]) 04/22/2016  . Atrial flutter ([place])   . Nontraumatic cortical hemorrhage of left cerebral hemisphere ([place]) 05/03/2015  . Chronic anticoagulation 05/03/2015  . Cardiac pacemaker in situ 05/03/2015  . ICH (intracerebral hemorrhage) ([place]) 03/25/2015  . Encounter for therapeutic drug monitoring 10/04/2013  . Dyspnea on exertion 07/26/2013  . Chest pain 02/04/2013  . Orthostatic lightheadedness 06/25/2012  .  Pacemaker-Medtronic 11/12/2011  . Ascending aorta enlargement ([place]) 08/12/2011  . Chronic diastolic heart failure ([place]) 12/27/2010  . Long term (current) use of anticoagulants 10/15/2010  . PREMATURE VENTRICULAR CONTRACTIONS 10/17/2009  . post termination pauses/.sinus bradycardia 11/17/2008  . Essential hypertension 04/28/2008  . Atrial fibrillation ([place]) 04/28/2008    Past Surgical History:  Procedure Laterality Date  . CARDIAC CATHETERIZATION    . CARDIOVERSION  03/02/2012   Procedure: CARDIOVERSION;  Surgeon: Lelon Perla, MD;  Location[facility] ENDOSCOPY;   Service: Cardiovascular;  Laterality: N/A;  . CARDIOVERSION  03/13/2012   Procedure: CARDIOVERSION;  Surgeon: Josue Hector, MD;  Location: [facility];  Service: Cardiovascular;  Laterality: N/A;  . CARDIOVERSION  04/15/2012   Procedure: CARDIOVERSION;  Surgeon: [name], MD;  Location: [facility];  Service: Cardiovascular;  Laterality: N/A;  . CARDIOVERSION N/A 06/07/2014   Procedure: CARDIOVERSION;  Surgeon: [name], MD;  Location[facility] ENDOSCOPY;  Service: Cardiovascular;  Laterality: N/A;  . CARDIOVERSION N/A 08/18/2014   Procedure: CARDIOVERSION;  Surgeon: [name], MD;  Location: [facility];  Service: Cardiovascular;  Laterality: N/A;  . CARDIOVERSION N/A 09/06/2015   Procedure: CARDIOVERSION;  Surgeon: Jerline Pain, MD;  Location: [facility];  Service: Cardiovascular;  Laterality: N/A;  . CYSTOSCOPY WITH STENT PLACEMENT Right 04/22/2016   Procedure: CYSTOSCOPY WITH RIGHT URETERAL STENT INSERTION;  Surgeon: [name], MD;  Location: [facility];  Service: Urology;  Laterality: Right;  . HERNIA REPAIR    . INCISION AND DRAINAGE BREAST ABSCESS  1973  . PACEMAKER INSERTION  Jan 2013  . PERMANENT PACEMAKER INSERTION N/A 07/29/2011   Procedure: PERMANENT PACEMAKER INSERTION;  Surgeon: [name], MD;  Location: [facility] CATH LAB;  Service: Cardiovascular;  Laterality: N/A;  . UMBILICAL HERNIA REPAIR  1950's    OB History    No data available       Home Medications    Prior to Admission medications   Medication Sig Start Date End Date Taking? Authorizing Provider  apixaban (ELIQUIS) 5 MG TABS tablet Take 1 tablet (5 mg total) by mouth 2 (two) times daily. 10/24/15  Yes Rosalin Hawking, MD  Calcium Carbonate-Vitamin D (CALTRATE 600+D PO) Take 1 tablet by mouth 2 (two) times daily.   Yes Historical Provider, MD  cetirizine (ZYRTEC) 10 MG tablet Take 10 mg by mouth daily as needed for allergies.    Yes Historical Provider, MD  Cholecalciferol (VITAMIN D3) 1000 UNITS  tablet Take 1,000 Units by mouth at bedtime.    Yes Historical Provider, MD  ciprofloxacin (CIPRO) 500 MG tablet Take 1 tablet (500 mg total) by mouth 2 (two) times daily. 04/25/16  Yes Allie Bossier, MD  diltiazem (CARDIZEM CD) 120 MG 24 hr capsule Take 1 capsule (120 mg total) by mouth daily. 10/09/15  Yes [name], MD  hydrocortisone (ANUSOL-HC) 25 MG suppository Place 1 suppository (25 mg total) rectally 2 (two) times daily. 04/25/16  Yes Allie Bossier, MD  labetalol (NORMODYNE) 300 MG tablet TAKE ONE BY MOUTH TWO TIMES A DAY. 02/06/16  Yes [name], MD  methimazole (TAPAZOLE) 5 MG tablet Take one tablet (5 mg) by mouth twice daily 04/29/16  Yes [name], MD  multivitamin [facility]) per tablet Take 1 tablet by mouth at bedtime.    Yes Historical Provider, MD  multivitamin-lutein (OCUVITE-LUTEIN) CAPS capsule Take 1 capsule by mouth daily.   Yes Historical Provider, MD  potassium chloride (K-DUR) 10 MEQ tablet Take 10 mEq by  mouth 2 (two) times daily.   Yes Historical Provider, MD  predniSONE (DELTASONE) 10 MG tablet Take 5-20 mg by mouth daily. 20 mg x 2 weeks, then 10 x 2 weeks, then 5 x2 weeks. 04/29/16  Yes [name], MD  valsartan (DIOVAN) 320 MG tablet Take 320 mg by mouth daily.   Yes Historical Provider, MD  [facility] 625 MG tablet Take 625-1,250 mg by mouth 2 (two) times daily. Take 625 mg every morning and 1250 mg at bedtime. 05/08/12  Yes Historical Provider, MD    Family History Family History  Problem Relation Age of Onset  . Coronary artery disease Mother   . Coronary artery disease    . Alzheimer's disease    . Coronary artery disease Brother   . Coronary artery disease Brother    Social History Social History  Substance Use Topics  . Smoking status: Former Smoker    Types: Cigarettes    Quit date: 07/16/1990  . Smokeless tobacco: Never Used  . Alcohol use No    Allergies   Penicillins; Statins; Tylenol [acetaminophen]; Tape; and  Codeine  Review of Systems Review of Systems  Constitutional: Negative for diaphoresis and fever.  Gastrointestinal: Positive for abdominal pain. Negative for nausea and vomiting.  Genitourinary: Positive for decreased urine volume, dysuria, hematuria and pelvic pain. Negative for flank pain.  Musculoskeletal: Negative for back pain.  All other systems reviewed and are negative.  Physical Exam Updated Vital Signs BP (!) 116/54 (BP Location: Left Arm)   Pulse 68   Temp 98.2 F (36.8 C) (Oral)   Resp 18   Ht 5' 6"  (1.676 m)   Wt 179 lb (81.2 kg)   SpO2 99%   BMI 28.89 kg/m   Physical Exam  Constitutional: She is oriented to person, place, and time. She appears well-developed and well-nourished. No distress.  HENT:  Head: Normocephalic and atraumatic.  Right Ear: External ear normal.  Left Ear: External ear normal.  Nose: Nose normal.  Mouth/Throat: Oropharynx is clear and moist. No oropharyngeal exudate.  Eyes: Conjunctivae and EOM are normal. Pupils are equal, round, and reactive to light. Right eye exhibits no discharge. Left eye exhibits no discharge. No scleral icterus.  Neck: Normal range of motion. Neck supple. No JVD present. No tracheal deviation present.  Cardiovascular: Normal rate and regular rhythm.   Pulmonary/Chest: Effort normal and breath sounds normal. No stridor. No respiratory distress.  Abdominal: Soft. Bowel sounds are normal. She exhibits no distension and no mass. There is no tenderness. There is no rebound and no guarding.  Musculoskeletal: Normal range of motion. She exhibits no edema.  Lymphadenopathy:    She has no cervical adenopathy.  Neurological: She is alert and oriented to person, place, and time. She exhibits normal muscle tone. Coordination normal.  Skin: Skin is warm and dry. No rash noted. She is not diaphoretic. No erythema. No pallor.  Psychiatric: She has a normal mood and affect. Her behavior is normal. Judgment and thought content  normal.  Nursing note and vitals reviewed.  ED Treatments / Results  DIAGNOSTIC STUDIES:  Oxygen Saturation is 99% on RA, normal by my interpretation.    COORDINATION OF CARE:  11:56 PM Will order urinalysis and I-stat Chem 8. Discussed treatment plan with pt at bedside and pt agreed to plan.   Labs (all labs ordered are listed, but only abnormal results are displayed) Labs Reviewed - No data to display  EKG  EKG Interpretation None  Radiology No results found.  Procedures Procedures (including critical care time)  Medications Ordered in ED Medications - No data to display   Initial Impression / Assessment and Plan / ED Course  I have reviewed the triage vital signs and the nursing notes.  Pertinent labs & imaging results that were available during my care of the patient were reviewed by me and considered in my medical decision making (see chart for details).  Clinical Course   Pt presents with urinary retention onset this morning.  She recently had a kidney stone and stent placed, she has been having hematuria since her procedure, the urologists office has told her this is fine and to keep her appointment, which is tomorrow morning. Since coming to the ER she has been able to urinate 2x, with gross hematuria.  She has little discomfort, abdominal exam benign. H/H checked, urine has large blood, negative nitrite and morderate leukocytes.  Pt is well appearing, feel she can discharge home and see urology at her scheduled appt tomorrow morning for stent removal.  Return precautions reviewed. VSS, pt ambulatory and tolerating PO's, discharged home in good condition.  Final Clinical Impressions(s) / ED Diagnoses   Final diagnoses:  Dysuria  Hematuria, unspecified type   New Prescriptions Discharge Medication List as of 04/30/2016  1:03 AM    I personally performed the services described in this documentation, which was scribed in my presence. The recorded  information has been reviewed and is accurate.      Delsa Grana, PA-C 05/21/16 [id number]    Veryl Speak, MD 05/22/16 [id number]

## 2016-04-29 NOTE — Telephone Encounter (Signed)
The patient is also stating that since her lasix was stopped at discharge, she is having swelling that she his visibly noticing. Her discharge weight was 192 lbs. She states she was 179 lbs when she went in. She was previously on lasix 20 mg once daily and potassium 10 meq once daily. She has not weighed herself daily since discharge on 04/25/16. I have advised her I will review with Dr. Caryl Comes this after noon and call her back, but have encouraged daily weights starting tomorrow. She voices understanding.

## 2016-04-29 NOTE — ED Triage Notes (Addendum)
Patient BIB [facility] from home. Patient unable to urinate since this morning. Patient seen last week for kidney stone, stent was placed. Patient reported to ems that small amount of blood was seen. Patient also reporting lower abdominal/pelvic pain. Patient taking Cipro for UTI.

## 2016-04-30 ENCOUNTER — Ambulatory Visit (HOSPITAL_COMMUNITY): Payer: Medicare Other | Admitting: Certified Registered Nurse Anesthetist

## 2016-04-30 ENCOUNTER — Encounter (HOSPITAL_COMMUNITY)
Admission: RE | Disposition: A | Discharge status: Home / Self Care | Payer: Self-pay | Source: Ambulatory Visit | Attending: Urology

## 2016-04-30 ENCOUNTER — Ambulatory Visit (HOSPITAL_COMMUNITY)
Admission: RE | Admit: 2016-04-30 | Discharge: 2016-04-30 | Disposition: A | Discharge status: Home / Self Care | Payer: Medicare Other | Source: Ambulatory Visit | Attending: Urology | Admitting: Urology

## 2016-04-30 ENCOUNTER — Encounter (HOSPITAL_COMMUNITY): Payer: Self-pay

## 2016-04-30 DIAGNOSIS — I481 Persistent atrial fibrillation: Secondary | ICD-10-CM | POA: Insufficient documentation

## 2016-04-30 DIAGNOSIS — M353 Polymyalgia rheumatica: Secondary | ICD-10-CM | POA: Insufficient documentation

## 2016-04-30 DIAGNOSIS — Z8673 Personal history of transient ischemic attack (TIA), and cerebral infarction without residual deficits: Secondary | ICD-10-CM | POA: Insufficient documentation

## 2016-04-30 DIAGNOSIS — Z8249 Family history of ischemic heart disease and other diseases of the circulatory system: Secondary | ICD-10-CM | POA: Insufficient documentation

## 2016-04-30 DIAGNOSIS — I119 Hypertensive heart disease without heart failure: Secondary | ICD-10-CM | POA: Diagnosis not present

## 2016-04-30 DIAGNOSIS — Z950 Presence of cardiac pacemaker: Secondary | ICD-10-CM | POA: Insufficient documentation

## 2016-04-30 DIAGNOSIS — Z885 Allergy status to narcotic agent status: Secondary | ICD-10-CM | POA: Insufficient documentation

## 2016-04-30 DIAGNOSIS — Z87891 Personal history of nicotine dependence: Secondary | ICD-10-CM | POA: Insufficient documentation

## 2016-04-30 DIAGNOSIS — N201 Calculus of ureter: Secondary | ICD-10-CM | POA: Diagnosis not present

## 2016-04-30 DIAGNOSIS — Z87440 Personal history of urinary (tract) infections: Secondary | ICD-10-CM | POA: Insufficient documentation

## 2016-04-30 DIAGNOSIS — Q438 Other specified congenital malformations of intestine: Secondary | ICD-10-CM | POA: Insufficient documentation

## 2016-04-30 DIAGNOSIS — Z880 Allergy status to penicillin: Secondary | ICD-10-CM | POA: Insufficient documentation

## 2016-04-30 DIAGNOSIS — Z91048 Other nonmedicinal substance allergy status: Secondary | ICD-10-CM | POA: Insufficient documentation

## 2016-04-30 DIAGNOSIS — Z9889 Other specified postprocedural states: Secondary | ICD-10-CM | POA: Insufficient documentation

## 2016-04-30 DIAGNOSIS — E785 Hyperlipidemia, unspecified: Secondary | ICD-10-CM | POA: Insufficient documentation

## 2016-04-30 DIAGNOSIS — Z820 Family history of epilepsy and other diseases of the nervous system: Secondary | ICD-10-CM | POA: Insufficient documentation

## 2016-04-30 DIAGNOSIS — Z87892 Personal history of anaphylaxis: Secondary | ICD-10-CM | POA: Insufficient documentation

## 2016-04-30 DIAGNOSIS — Z888 Allergy status to other drugs, medicaments and biological substances status: Secondary | ICD-10-CM | POA: Insufficient documentation

## 2016-04-30 HISTORY — DX: Unspecified hemorrhoids: K64.9

## 2016-04-30 HISTORY — DX: Hypothyroidism, unspecified: E03.9

## 2016-04-30 HISTORY — PX: CYSTOSCOPY WITH URETEROSCOPY, STONE BASKETRY AND STENT PLACEMENT: SHX6378

## 2016-04-30 LAB — URINALYSIS, DIPSTICK ONLY
Glucose, UA: NEGATIVE mg/dL
KETONES UR: 15 mg/dL — AB
NITRITE: NEGATIVE
PH: 5.5 (ref 5.0–8.0)
PROTEIN: 100 mg/dL — AB
Specific Gravity, Urine: 1.017 (ref 1.005–1.030)

## 2016-04-30 LAB — I-STAT CHEM 8, ED
BUN: 16 mg/dL (ref 6–20)
CALCIUM ION: 1.19 mmol/L (ref 1.15–1.40)
Chloride: 110 mmol/L (ref 101–111)
Creatinine, Ser: 1.1 mg/dL — ABNORMAL HIGH (ref 0.44–1.00)
Glucose, Bld: 87 mg/dL (ref 65–99)
HCT: 36 % (ref 36.0–46.0)
HEMOGLOBIN: 12.2 g/dL (ref 12.0–15.0)
Potassium: 3.6 mmol/L (ref 3.5–5.1)
SODIUM: 142 mmol/L (ref 135–145)
TCO2: 24 mmol/L (ref 0–100)

## 2016-04-30 LAB — PROTIME-INR
INR: 1.07
Prothrombin Time: 13.9 seconds (ref 11.4–15.2)

## 2016-04-30 SURGERY — CYSTOSCOPY, WITH CALCULUS MANIPULATION OR REMOVAL
Anesthesia: Monitor Anesthesia Care | Site: Ureter | Laterality: Right

## 2016-04-30 MED ORDER — HYDROMORPHONE HCL 1 MG/ML IJ SOLN: 0.2500 mg | INTRAMUSCULAR | Status: DC | PRN

## 2016-04-30 MED ORDER — SODIUM CHLORIDE 0.9 % IR SOLN
Status: DC | PRN
  Administered 2016-04-30: 1000 mL

## 2016-04-30 MED ORDER — BELLADONNA ALKALOIDS-OPIUM 16.2-60 MG RE SUPP
RECTAL | Status: AC
  Filled 2016-04-30: qty 1

## 2016-04-30 MED ORDER — SODIUM CHLORIDE 0.9 % IV SOLN
250.0000 mL | INTRAVENOUS | Status: DC | PRN
Start: 2016-04-30 — End: 2016-04-30

## 2016-04-30 MED ORDER — ONDANSETRON HCL 4 MG/2ML IJ SOLN
INTRAMUSCULAR | Status: AC
Start: 2016-04-30 — End: 2016-04-30
  Filled 2016-04-30: qty 2

## 2016-04-30 MED ORDER — FENTANYL CITRATE (PF) 100 MCG/2ML IJ SOLN
INTRAMUSCULAR | Status: DC | PRN
  Administered 2016-04-30: 50 ug via INTRAVENOUS

## 2016-04-30 MED ORDER — SODIUM CHLORIDE 0.9% FLUSH: 3.0000 mL | INTRAVENOUS | Status: DC | PRN

## 2016-04-30 MED ORDER — FENTANYL CITRATE (PF) 100 MCG/2ML IJ SOLN
INTRAMUSCULAR | Status: AC
  Filled 2016-04-30: qty 2

## 2016-04-30 MED ORDER — LIDOCAINE HCL 2 % EX GEL
CUTANEOUS | Status: AC
  Filled 2016-04-30: qty 5

## 2016-04-30 MED ORDER — SODIUM CHLORIDE 0.9 % IR SOLN
Status: DC | PRN
  Administered 2016-04-30: 4000 mL

## 2016-04-30 MED ORDER — PROMETHAZINE HCL 25 MG/ML IJ SOLN: 6.2500 mg | INTRAMUSCULAR | Status: DC | PRN

## 2016-04-30 MED ORDER — TRAMADOL HCL 50 MG PO TABS: 50.0000 mg | ORAL_TABLET | Freq: Four times a day (QID) | ORAL | 0 refills | Status: DC | PRN

## 2016-04-30 MED ORDER — LACTATED RINGERS IV SOLN
INTRAVENOUS | Status: DC
  Administered 2016-04-30: 1000 mL via INTRAVENOUS

## 2016-04-30 MED ORDER — PROPOFOL 10 MG/ML IV BOLUS
INTRAVENOUS | Status: DC | PRN
  Administered 2016-04-30: 130 mg via INTRAVENOUS

## 2016-04-30 MED ORDER — FENTANYL CITRATE (PF) 100 MCG/2ML IJ SOLN: 25.0000 ug | INTRAMUSCULAR | Status: DC | PRN

## 2016-04-30 MED ORDER — ONDANSETRON HCL 4 MG/2ML IJ SOLN
INTRAMUSCULAR | Status: DC | PRN
  Administered 2016-04-30: 4 mg via INTRAVENOUS

## 2016-04-30 MED ORDER — ACETAMINOPHEN 325 MG PO TABS
650.0000 mg | ORAL_TABLET | ORAL | Status: AC
  Administered 2016-04-30: 650 mg via ORAL
  Filled 2016-04-30: qty 2

## 2016-04-30 MED ORDER — PHENYLEPHRINE 40 MCG/ML (10ML) SYRINGE FOR IV PUSH (FOR BLOOD PRESSURE SUPPORT)
PREFILLED_SYRINGE | INTRAVENOUS | Status: DC | PRN
  Administered 2016-04-30: 80 ug via INTRAVENOUS

## 2016-04-30 MED ORDER — OXYCODONE HCL 5 MG PO TABS: 5.0000 mg | ORAL_TABLET | ORAL | Status: DC | PRN

## 2016-04-30 MED ORDER — LIDOCAINE 2% (20 MG/ML) 5 ML SYRINGE
INTRAMUSCULAR | Status: DC | PRN
  Administered 2016-04-30: 100 mg via INTRAVENOUS

## 2016-04-30 MED ORDER — PROPOFOL 10 MG/ML IV BOLUS
INTRAVENOUS | Status: AC
  Filled 2016-04-30: qty 20

## 2016-04-30 MED ORDER — SODIUM CHLORIDE 0.9% FLUSH: 3.0000 mL | Freq: Two times a day (BID) | INTRAVENOUS | Status: DC

## 2016-04-30 MED ORDER — LIDOCAINE 2% (20 MG/ML) 5 ML SYRINGE
INTRAMUSCULAR | Status: AC
  Filled 2016-04-30: qty 5

## 2016-04-30 SURGICAL SUPPLY — 23 items
BAG URO CATCHER STRL LF (MISCELLANEOUS) ×4 IMPLANT
BASKET LASER NITINOL 1.9FR (BASKET) IMPLANT
BASKET STONE NCOMPASS (UROLOGICAL SUPPLIES) IMPLANT
CATH URET 5FR 28IN OPEN ENDED (CATHETERS) ×4 IMPLANT
CATH URET DUAL LUMEN 6-10FR 50 (CATHETERS) ×4 IMPLANT
CLOTH BEACON ORANGE TIMEOUT ST (SAFETY) ×4 IMPLANT
EXTRACTOR STONE NITINOL NGAGE (UROLOGICAL SUPPLIES) ×4 IMPLANT
FIBER LASER FLEXIVA 1000 (UROLOGICAL SUPPLIES) IMPLANT
FIBER LASER FLEXIVA 365 (UROLOGICAL SUPPLIES) IMPLANT
FIBER LASER FLEXIVA 550 (UROLOGICAL SUPPLIES) IMPLANT
FIBER LASER TRAC TIP (UROLOGICAL SUPPLIES) IMPLANT
GLOVE SURG SS PI 8.0 STRL IVOR (GLOVE) IMPLANT
GOWN STRL REUS W/TWL XL LVL3 (GOWN DISPOSABLE) ×4 IMPLANT
GUIDEWIRE STR DUAL SENSOR (WIRE) ×4 IMPLANT
IV NS 1000ML (IV SOLUTION) ×2
IV NS 1000ML BAXH (IV SOLUTION) ×2 IMPLANT
IV NS IRRIG 3000ML ARTHROMATIC (IV SOLUTION) ×4 IMPLANT
MANIFOLD NEPTUNE II (INSTRUMENTS) ×4 IMPLANT
PACK CYSTO (CUSTOM PROCEDURE TRAY) ×4 IMPLANT
SHEATH ACCESS URETERAL 38CM (SHEATH) ×4 IMPLANT
SHEATH URET ACCESS 10/12FR (MISCELLANEOUS) IMPLANT
TUBING CONNECTING 10 (TUBING) ×3 IMPLANT
TUBING CONNECTING 10' (TUBING) ×1

## 2016-04-30 NOTE — Brief Op Note (Signed)
04/30/2016  10:49 AM  PATIENT:  [name] [name]  82 y.o. female  PRE-OPERATIVE DIAGNOSIS:  RIGHT DISTAL STONE  POST-OPERATIVE DIAGNOSIS:  RIGHT DISTAL STONE PASSED  PROCEDURE:  Procedure(s): CYSTOSCOPY WITH URETEROSCOPY, STENT PLACEMENT REMOVAL (Right)  SURGEON:  Surgeon(s) and Role:    * [name], MD - Primary  PHYSICIAN ASSISTANT:   ASSISTANTS: none   ANESTHESIA:   general  EBL:  No intake/output data recorded.  BLOOD ADMINISTERED:none  DRAINS: none   LOCAL MEDICATIONS USED:  NONE  SPECIMEN:  No Specimen  DISPOSITION OF SPECIMEN:  N/A  COUNTS:  YES  TOURNIQUET:  * No tourniquets in log *  DICTATION: .Other Dictation: Dictation Number [id number]   I didn't get a dictation number and am not sure the transcription took.   PLAN OF CARE: Discharge to home after PACU  PATIENT DISPOSITION:  PACU - hemodynamically stable.   Delay start of Pharmacological VTE agent (>24hrs) due to surgical blood loss or risk of bleeding: not applicable

## 2016-04-30 NOTE — Discharge Instructions (Signed)
See your urologist tomorrow as scheduled

## 2016-04-30 NOTE — Progress Notes (Signed)
Patient was taken off lasix and valsartan during her hospitalization last week. Her legs are very edematous and she states she has gained a lot of weight in one week (11 pounds). Called Dr Jeffie Pollock. Patient is to restart lasix 20 mg daily and valsartan 320 daily.

## 2016-04-30 NOTE — H&P (View-Only) (Signed)
Subjective: CC: Right flank pain.  Hx:  [name] [name] is an 82 yo WF who I was asked to see by Quincy Carnes PA for sepsis with an obstructing right ureteral stone.  She had the onset of voiding symptoms about a week ago and had the onset last night of severe right flank pain with nausea and vomiting.  She was febrile to 102 on arrival and has chills.   Her lactic acid was elevated to 2.61 but has declined to 1.64 with initial management.   Her UA looks infected and cultures are pending.  She has no prior history of stones or GU surgery but has had recurrent UTI's.   Her pain is improved with medication.  She remains hypotensive.  ROS:  Review of Systems  Constitutional: Positive for chills.  Respiratory: Negative for shortness of breath.   Cardiovascular: Positive for leg swelling. Negative for chest pain.  Genitourinary: Positive for flank pain and urgency.  All other systems reviewed and are negative.   Allergies  Allergen Reactions  . Penicillins Anaphylaxis    Has patient had a PCN reaction causing immediate rash, facial/tongue/throat swelling, SOB or lightheadedness with hypotension: Yes Has patient had a PCN reaction causing severe rash involving mucus membranes or skin necrosis: No Has patient had a PCN reaction that required hospitalization No Has patient had a PCN reaction occurring within the last 10 years: No If all of the above answers are "NO", then may proceed with Cephalosporin use.   . Statins Other (See Comments)    REACTION: elevated LFT's  . Tylenol [Acetaminophen] Other (See Comments)    If taken with Vytorin at risk for liver damage  . Tape Other (See Comments)    Redness, Please use "paper" tape only.  . Codeine Other (See Comments)    feel funny, head is fuzzy    Past Medical History:  Diagnosis Date  . Ascending aorta enlargement (HCC)    4.8 cm per echo 08/07/11; 5.0 by CT in Jan 2013  . CAD (coronary artery disease)    LHC 5/08:  pOM 20%, pRCA 20-30%, EF  60%  . Hepatitis    hx of medication induced hepatitis (Vytorin per pt)  . History of loop recorder   . HLD (hyperlipidemia)   . HTN (hypertension)   . Hx of echocardiogram    echo 1/13: EF 55%, Gr 2 diast dysfn, Asc Ao aneurysm 4.8 cm, mild MR, mod LAE, mild RAE  . ICH (intracerebral hemorrhage) ([place]) 03/24/15  . NASH (nonalcoholic steatohepatitis)   . Neuromuscular disorder ([place])   . Pacemaker-Medtronic 11/12/2011   Implanted 2013   . Persistent atrial fibrillation ([place])   . PMR (polymyalgia rheumatica) (HCC)    no steriods for 5 years  . sinus node dysfunction//post termination pauses   . Stroke ([place])   . Urinary tract infection    Recurrent infections.     Past Surgical History:  Procedure Laterality Date  . CARDIAC CATHETERIZATION    . CARDIOVERSION  03/02/2012   Procedure: CARDIOVERSION;  Surgeon: Lelon Perla, MD;  Location: [facility] ENDOSCOPY;  Service: Cardiovascular;  Laterality: N/A;  . CARDIOVERSION  03/13/2012   Procedure: CARDIOVERSION;  Surgeon: Josue Hector, MD;  Location: [facility] ENDOSCOPY;  Service: Cardiovascular;  Laterality: N/A;  . CARDIOVERSION  04/15/2012   Procedure: CARDIOVERSION;  Surgeon: [name], MD;  Location[facility] ENDOSCOPY;  Service: Cardiovascular;  Laterality: N/A;  . CARDIOVERSION N/A 06/07/2014   Procedure: CARDIOVERSION;  Surgeon: [name], MD;  Location: [place];  Service: Cardiovascular;  Laterality: N/A;  . CARDIOVERSION N/A 08/18/2014   Procedure: CARDIOVERSION;  Surgeon: [name], MD;  Location: [place];  Service: Cardiovascular;  Laterality: N/A;  . CARDIOVERSION N/A 09/06/2015   Procedure: CARDIOVERSION;  Surgeon: Jerline Pain, MD;  Location: [place];  Service: Cardiovascular;  Laterality: N/A;  . HERNIA REPAIR    . INCISION AND DRAINAGE BREAST ABSCESS  1973  . PACEMAKER INSERTION  Jan 2013  . PERMANENT PACEMAKER INSERTION N/A 07/29/2011   Procedure: PERMANENT PACEMAKER INSERTION;  Surgeon: [name],  MD;  Location: [facility] CATH LAB;  Service: Cardiovascular;  Laterality: N/A;  . UMBILICAL HERNIA REPAIR  1950's    Social History   Social History  . Marital status: Married    Spouse name: N/A  . Number of children: N/A  . Years of education: N/A   Occupational History  . Not on file.   Social History Main Topics  . Smoking status: Former Smoker    Types: Cigarettes    Quit date: 07/16/1990  . Smokeless tobacco: Never Used  . Alcohol use No  . Drug use: No  . Sexual activity: Not on file   Other Topics Concern  . Not on file   Social History Narrative   Pt lives in [place] with spouse.  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 Coronary artery disease Mother   . Coronary artery disease    . Alzheimer's disease    . Coronary artery disease Brother   . Coronary artery disease Brother     Anti-infectives: Anti-infectives    Start     Dose/Rate Route Frequency Ordered Stop   04/22/16 0830  metroNIDAZOLE (FLAGYL) IVPB 500 mg     500 mg 100 mL/hr over 60 Minutes Intravenous  Once 04/22/16 0826 04/22/16 1011   04/22/16 0830  ciprofloxacin (CIPRO) IVPB 400 mg     400 mg 200 mL/hr over 60 Minutes Intravenous  Once 04/22/16 0826 04/22/16 1012      Current Facility-Administered Medications  Medication Dose Route Frequency Provider Last Rate Last Dose  . 0.9 %  sodium chloride infusion   Intravenous Continuous Radene Gunning, NP      . apixaban [facility]) tablet 5 mg  5 mg Oral BID Lezlie Octave Black, NP      . enoxaparin (LOVENOX) injection 40 mg  40 mg Subcutaneous Q24H Lezlie Octave Black, NP      . iopamidol (ISOVUE-300) 61 % injection 75 mL  75 mL Intravenous Once PRN Courteney Lyn Mackuen, MD      . iopamidol (ISOVUE-300) 61 % injection           . ondansetron (ZOFRAN) tablet 4 mg  4 mg Oral Q6H PRN Radene Gunning, NP       Or  . ondansetron [facility]) injection 4 mg  4 mg Intravenous Q6H PRN Lezlie Octave Black, NP       . sodium chloride flush (NS) 0.9 % injection 3 mL  3 mL Intravenous Q12H Radene Gunning, NP       Current Outpatient Prescriptions  Medication Sig Dispense Refill  . apixaban (ELIQUIS) 5 MG TABS tablet Take 1 tablet (5 mg total) by mouth 2 (two) times daily. 60 tablet 6  . Calcium Carbonate-Vitamin D (CALTRATE 600+D PO) Take 1 tablet by mouth 2 (two) times daily.    . cetirizine (ZYRTEC) 10  MG tablet Take 10 mg by mouth daily as needed for allergies.     . Cholecalciferol (VITAMIN D3) 1000 UNITS tablet Take 1,000 Units by mouth at bedtime.     Marland Kitchen diltiazem (CARDIZEM CD) 120 MG 24 hr capsule Take 1 capsule (120 mg total) by mouth daily. 90 capsule 3  . furosemide (LASIX) 20 MG tablet Take 1 tablet (20 mg total) by mouth daily. 30 tablet 9  . labetalol (NORMODYNE) 300 MG tablet TAKE ONE BY MOUTH TWO TIMES A DAY. 60 tablet 8  . methimazole (TAPAZOLE) 10 MG tablet Take 1 tablet (10 mg total) by mouth 3 (three) times daily. 90 tablet 2  . multivitamin (THERAGRAN) per tablet Take 1 tablet by mouth at bedtime.     . potassium chloride (K-DUR) 10 MEQ tablet Take 1 tablet (10 mEq total) by mouth 2 (two) times daily. 180 tablet 3  . valsartan (DIOVAN) 320 MG tablet TAKE 1 TABLET BY MOUTH DAILY 30 tablet 11  . WELCHOL 625 MG tablet Take 625-1,250 mg by mouth 2 (two) times daily. Take 625 mg every morning and 1250 mg at bedtime.     Facility-Administered Medications Ordered in Other Encounters  Medication Dose Route Frequency Provider Last Rate Last Dose  . lactated ringers infusion    Continuous PRN Neldon Newport, CRNA        Past medical, surgical, social and family history reviewed and updated.   Objective: Vital signs in last 24 hours: Temp:  [97.5 F (36.4 C)-102.2 F (39 C)] 102.2 F (39 C) (10/09 0803) Pulse Rate:  [92-113] 94 (10/09 1220) Resp:  [18-27] 23 (10/09 1220) BP: (82-136)/(46-97) 89/53 (10/09 1220) SpO2:  [90 %-98 %] 94 % (10/09 1220) Weight:  [81.2 kg (179 lb)] 81.2 kg  (179 lb) (10/09 0620)  Intake/Output from previous day: No intake/output data recorded. Intake/Output this shift: Total I/O In: 2500 [I.V.:2500] Out: 330 [Urine:330]   Physical Exam  Constitutional: She is oriented to person, place, and time and well-developed, well-nourished, and in no distress.  HENT:  Head: Normocephalic and atraumatic.  Neck: Normal range of motion. Neck supple. No thyromegaly present.  Cardiovascular: Normal rate and regular rhythm.   No murmur heard. Pulmonary/Chest: Breath sounds normal. No respiratory distress.  Abdominal: Soft. She exhibits no distension and no mass. There is tenderness (right flank. ). There is no rebound and no guarding.  Musculoskeletal: Normal range of motion. She exhibits no edema or tenderness.  Lymphadenopathy:    She has no cervical adenopathy.  Neurological: She is alert and oriented to person, place, and time.  Skin: Skin is warm and dry.  Psychiatric: Mood and affect normal.    Lab Results:   Recent Labs  04/22/16 0640  WBC 9.1  HGB 11.9*  HCT 37.7  PLT 285   BMET  Recent Labs  04/22/16 0640  NA 139  K 4.1  CL 103  CO2 25  GLUCOSE 110*  BUN 15  CREATININE 1.24*  CALCIUM 9.9   PT/INR No results for input(s): LABPROT, INR in the last 72 hours. ABG No results for input(s): PHART, HCO3 in the last 72 hours.  Invalid input(s): PCO2, PO2  Studies/Results: Ct Abdomen Pelvis W Contrast  Result Date: 04/22/2016 CLINICAL DATA:  Right flank pain and nausea and vomiting starting at 2 a.m. EXAM: CT ABDOMEN AND PELVIS WITH CONTRAST TECHNIQUE: Multidetector CT imaging of the abdomen and pelvis was performed using the standard protocol following bolus administration of intravenous contrast. CONTRAST:  75 cc Isovue 300 COMPARISON:  Abdominal radiograph 04/08/2016. Abdominal ultrasound 11/28/2006. FINDINGS: Lower chest: Bandlike scarring or atelectasis in the right lower lobe. 2 mm nodule, right middle lobe, image 8/3.  Aortic valve calcification and right coronary artery calcification. Pacer leads noted. Mild cardiomegaly. Hepatobiliary: Punctate calcification along the inferior capsular margin of the right hepatic lobe on image 34/2, likely incidental. Gallbladder unremarkable. Pancreas: Unremarkable Spleen: Unremarkable Adrenals/Urinary Tract: Adrenal glands normal. Right perirenal stranding and delayed nephrogram minutes delayed excretion of contrast to the right kidney the associated with mild moderate right hydronephrosis and moderate right hydroureter extending down to a 2 mm right ureterovesical junction calculus on image 76/2. There is a 1 mm upper pole nonobstructive right renal calculus on image 94/5. No other renal calculi identified. Urinary bladder normal. Exophytic from the right kidney upper pole posteriorly there is a 1.4 by 1.2 by 1.1 cm lesion measured at 21 Hounsfield units on portal venous phase images and 20 Hounsfield units on delayed phase images. However, density measurement may be septic to inaccuracy due to volume averaging. Looking back to the prior chest CT of 08/13/2011, this lesion was of a similar size and density. Fluid in the gallbladder measures 12 Hounsfield units today. Exaggerated anterior-posterior long axis of the right kidney. Stomach/Bowel: Redundant sigmoid colon. Vascular/Lymphatic: Aortoiliac atherosclerotic vascular disease. Fusiform infrarenal ectasia up to 2.7 cm, without overt aneurysm. No pathologic adenopathy identified. Reproductive: Unremarkable Other: Trace free fluid in the right perirectal space, image 70/2. Musculoskeletal: Slightly transitional S1 segment. Otherwise unremarkable. IMPRESSION: 1. 2 mm right UVJ calculus associated with moderate right hydroureter, mild to moderate right hydronephrosis, delayed enhancement of the right kidney, delayed excretion of the right kidney, and right perinephric stranding. There is also a separate 1 mm right kidney upper pole  nonobstructive renal calculus. 2. Mildly complex right kidney upper pole lesion measuring up to 1.4 cm, no change in size or apparent complexity compared to 1/20 03/2012. Most likely this is a complex cyst. I do not have precontrast imaging available to directly assess for enhancement and assigned a Bosniak classification. 3. Vascular findings include aortic valve calcification, right coronary artery calcification, aortoiliac atherosclerotic calcification, and fusiform infrarenal ectasia up to 2.7 cm without overt aneurysm. 4. Trace right perirectal fluid, fairly close in position to the mildly dilated right ureter and potentially related to the ureteral inflammation/obstruction. 5. Mild cardiomegaly. Electronically Signed   By: Van Clines M.D.   On: 04/22/2016 09:29   US Abdomen Limited  Result Date: 04/22/2016 CLINICAL DATA:  Right upper quadrant pain elevated LFT EXAM: US ABDOMEN LIMITED - RIGHT UPPER QUADRANT COMPARISON:  CT abdomen pelvis 04/22/2016 FINDINGS: Gallbladder: No gallstones or wall thickening visualized. No sonographic Murphy sign noted by sonographer. Common bile duct: Diameter: 4.2 mm Liver: No focal lesion identified. Within normal limits in parenchymal echogenicity. Mild right hydronephrosis. IMPRESSION: Negative for gallstones Right hydronephrosis as noted on CT. Electronically Signed   By: Franchot Gallo M.D.   On: 04/22/2016 10:00     Assessment: She has a right distal stone with hydronephrosis and sepsis. She is on Eliquis for a-fib.  She need cystoscopy with right ureteral stenting.   I have reviewed the risks including bleeding, infection, ureteral injury, stent irritation, need for secondary procedures, thrombotic events and anesthetic complications.   I explained the indications and need for source control.  She is not a candidate for a perc because of the Eliquis.   She will be admitted to the hospital service after  surgery.    CC: Quincy Carnes PA and Dr. Linna Darner.      Chrstopher Malenfant J 04/22/2016 [phone]

## 2016-04-30 NOTE — Interval H&P Note (Signed)
History and Physical Interval Note:  She returns today for ureteroscopic removal of the right distal stone and the stent now that she has been treated for the sepsis.   She had to go the ER last night because of recurrent pain and reduced UOP but had no intervention.  She is better today and remains on Cipro and took one this morning.   04/30/2016 9:48 AM  [name] [name]  has presented today for surgery, with the diagnosis of RIGHT DISTAL STONE  The various methods of treatment have been discussed with the patient and family. After consideration of risks, benefits and other options for treatment, the patient has consented to  Procedure(s): CYSTOSCOPY WITH URETEROSCOPY, STONE BASKETRY AND POSSIBLE STENT PLACEMENT (Right) HOLMIUM LASER APPLICATION (Right) as a surgical intervention .  The patient's history has been reviewed, patient examined, no change in status, stable for surgery.  I have reviewed the patient's chart and labs.  Questions were answered to the patient's satisfaction.     [name]

## 2016-04-30 NOTE — Anesthesia Procedure Notes (Signed)
Procedure Name: LMA Insertion Date/Time: 04/30/2016 10:31 AM Performed by: Montel Clock Pre-anesthesia Checklist: Patient identified, Emergency Drugs available, Suction available, Patient being monitored and Timeout performed Patient Re-evaluated:Patient Re-evaluated prior to inductionOxygen Delivery Method: Circle system utilized Preoxygenation: Pre-oxygenation with 100% oxygen Intubation Type: IV induction Ventilation: Mask ventilation without difficulty LMA: LMA with gastric port inserted LMA Size: 4.0 Number of attempts: 1 Dental Injury: Teeth and Oropharynx as per pre-operative assessment

## 2016-04-30 NOTE — Anesthesia Postprocedure Evaluation (Signed)
Anesthesia Post Note  Patient: [name] [name]  Procedure(s) Performed: Procedure(s) (LRB): CYSTOSCOPY WITH URETEROSCOPY, STENT PLACEMENT REMOVAL (Right)  Patient location during evaluation: PACU Anesthesia Type: General Level of consciousness: awake and alert Pain management: pain level controlled Vital Signs Assessment: post-procedure vital signs reviewed and stable Respiratory status: spontaneous breathing, nonlabored ventilation, respiratory function stable and patient connected to nasal cannula oxygen Cardiovascular status: blood pressure returned to baseline and stable Postop Assessment: no signs of nausea or vomiting Anesthetic complications: no    Last Vitals:  Vitals:   04/30/16 1102 04/30/16 1130  BP: 136/71   Pulse: 86   Resp: 16   Temp: 36.5 C 36.4 C    Last Pain:  Vitals:   04/30/16 1102  TempSrc:   PainSc: 0-No pain                 Kyal Arts S

## 2016-04-30 NOTE — Anesthesia Preprocedure Evaluation (Addendum)
Anesthesia Evaluation  Patient identified by MRN, date of birth, ID band Patient awake    Reviewed: Allergy & Precautions, NPO status , Patient's Chart, lab work & pertinent test results  History of Anesthesia Complications Negative for: history of anesthetic complications  Airway Mallampati: II  TM Distance: >3 FB Neck ROM: Full    Dental no notable dental hx.    Pulmonary former smoker,    Pulmonary exam normal breath sounds clear to auscultation       Cardiovascular hypertension, + CAD, + Peripheral Vascular Disease and +CHF  Normal cardiovascular exam+ pacemaker  Rhythm:Regular Rate:Normal     Neuro/Psych negative neurological ROS  negative psych ROS   GI/Hepatic negative GI ROS, (+) Hepatitis -  Endo/Other  Hypothyroidism Hyperthyroidism   Renal/GU negative Renal ROS  negative genitourinary   Musculoskeletal negative musculoskeletal ROS (+)   Abdominal   Peds negative pediatric ROS (+)  Hematology negative hematology ROS (+)   Anesthesia Other Findings   Reproductive/Obstetrics negative OB ROS                            Anesthesia Physical Anesthesia Plan  ASA: III  Anesthesia Plan: MAC   Post-op Pain Management:    Induction: Intravenous  Airway Management Planned: LMA  Additional Equipment:   Intra-op Plan:   Post-operative Plan: Extubation in OR  Informed Consent:   Dental advisory given  Plan Discussed with: CRNA, Anesthesiologist and Surgeon  Anesthesia Plan Comments:        Anesthesia Quick Evaluation

## 2016-04-30 NOTE — Transfer of Care (Signed)
Immediate Anesthesia Transfer of Care Note  Patient: [name] [name]  Procedure(s) Performed: Procedure(s): CYSTOSCOPY WITH URETEROSCOPY, STENT PLACEMENT REMOVAL (Right)  Patient Location: PACU  Anesthesia Type:General  Level of Consciousness:  sedated, patient cooperative and responds to stimulation  Airway & Oxygen Therapy:Patient Spontanous Breathing and Patient connected to face mask oxgen  Post-op Assessment:  Report given to PACU RN and Post -op Vital signs reviewed and stable  Post vital signs:  Reviewed and stable  Last Vitals:  Vitals:   04/30/16 0741  BP: 106/76  Pulse: 95  Resp: 18  Temp: 62.9 C    Complications: No apparent anesthesia complications

## 2016-04-30 NOTE — Telephone Encounter (Signed)
Per Dr. Caryl Comes- ok for the patient to resume lasix and potassium as previously taking. The patient is aware. She also states that she has been off eliquis until her urethral stent was removed, which was done today. Reviewed with Dr. Caryl Comes- ok to restart eliquis.

## 2016-04-30 NOTE — Discharge Instructions (Addendum)
Start your lasix and valsartan starting tomorrow. Please call Dr Olin Pia if your weight and edema does not decrease.     General Anesthesia, Adult, Care After Refer to this sheet in the next few weeks. These instructions provide you with information on caring for yourself after your procedure. Your health care provider may also give you more specific instructions. Your treatment has been planned according to current medical practices, but problems sometimes occur. Call your health care provider if you have any problems or questions after your procedure. WHAT TO EXPECT AFTER THE PROCEDURE After the procedure, it is typical to experience:  Sleepiness.  Nausea and vomiting. HOME CARE INSTRUCTIONS  For the first 24 hours after general anesthesia:  Have a responsible person with you.  Do not drive a car. If you are alone, do not take public transportation.  Do not drink alcohol.  Do not take medicine that has not been prescribed by your health care provider.  Do not sign important papers or make important decisions.  You may resume a normal diet and activities as directed by your health care provider.  Change bandages (dressings) as directed.  If you have questions or problems that seem related to general anesthesia, call the hospital and ask for the anesthetist or anesthesiologist on call. SEEK MEDICAL CARE IF:  You have nausea and vomiting that continue the day after anesthesia.  You develop a rash. SEEK IMMEDIATE MEDICAL CARE IF:   You have difficulty breathing.  You have chest pain.  You have any allergic problems.   This information is not intended to replace advice given to you by your health care provider. Make sure you discuss any questions you have with your health care provider.   Document Released: 10/07/2000 Document Revised: 07/22/2014 Document Reviewed: 10/30/2011 Elsevier Interactive Patient Education 2016 [place] CARE  INSTRUCTIONS  Activity: Rest for the remainder of the day.  Do not drive or operate equipment today.  You may resume normal activities in one to two days as instructed by your physician.   Meals: Drink plenty of liquids and eat light foods such as gelatin or soup this evening.  You may return to a normal meal plan tomorrow.  Return to Work: You may return to work in one to two days or as instructed by your physician.  Special Instructions / Symptoms: Call your physician if any of these symptoms occur:   -persistent or heavy bleeding  -bleeding which continues after first few urination  -large blood clots that are difficult to pass  -urine stream diminishes or stops completely  -fever equal to or higher than 101 degrees Farenheit.  -cloudy urine with a strong, foul odor  -severe pain  Females should always wipe from front to back after elimination.  You may feel some burning pain when you urinate.  This should disappear with time.  Applying moist heat to the lower abdomen or a hot tub bath may help relieve the pain. \    Patient Signature:  ________________________________________________________  Nurse's Signature:  ________________________________________________________

## 2016-05-01 ENCOUNTER — Encounter (HOSPITAL_COMMUNITY): Payer: Self-pay | Admitting: Urology

## 2016-05-01 NOTE — Op Note (Signed)
NAMESTEPHANI, JANAK NO.:  [id number]  MEDICAL RECORD NO.:  [id number]  LOCATION:  [facility]                         FACILITY:  [facility]  PHYSICIAN:  Marshall Cork. Jeffie Pollock, M.D.    DATE OF BIRTH:  [birth year]  DATE OF PROCEDURE:  04/30/2016 DATE OF DISCHARGE:  04/30/2016                              OPERATIVE REPORT   PROCEDURES:  Cystoscopy with removal of right double-J stent, right ureteroscopy.  PREOPERATIVE DIAGNOSIS:  Right distal ureteral stone with history of obstruction and sepsis.  POSTOPERATIVE DIAGNOSIS:  Right distal stone appears to pass.  SURGEON:  Marshall Cork. Jeffie Pollock, M.D.  ANESTHESIA:  General.  SPECIMEN:  None.  DRAINS:  None.  BLOOD LOSS:  None.  COMPLICATIONS:  None.  INDICATIONS:  Ms. Gaughran is an 82 year old white female who I previously seen for a right distal ureteral stone with obstruction and sepsis.  She underwent placement of a stent approximately a week ago and returns today for definitive stone removal and stent retrieval.  FINDINGS AND PROCEDURE:  She had been on p.o. Cipro for her culture, documented sensitivities and had a tablet this morning.  She was given a general anesthetic and placed in lithotomy position and fitted with PAS hose.  Her perineum and genitalia were prepped with Betadine solution, she was draped in usual sterile fashion.  Cystoscopy was performed using a 23-French scope and 30-degree lens. Examination revealed the normal urethra.  The urine was initially turbid, but with irrigation cleared.  The bladder wall was smooth with mild erythema.  The left ureteral orifice was unremarkable.  The right ureteral orifice had some edema with a stent looped at the meatus.  The stent was then grasped with a grasping forceps and pulled the urethral meatus.  A Sensor guidewire was then passed to the kidney under fluoroscopic guidance and the stent was removed.  The 6.5-French semi-rigid ureteroscope was then passed alongside  the wire and the ureter was inspected above the level of the iliacs.  There was a little bit of inflammatory tissue, but no obvious stone was identified.  In light of this, it was felt that stone had passed along the stent.  The ureteroscope was then removed followed by the guidewire.  The bladder was drained.  The patient was taken down from lithotomy position.  Her anesthetic was reversed.  She was moved to the recovery room in stable condition. There were no complications.     Marshall Cork. Jeffie Pollock, M.D.     JJW/MEDQ  D:  04/30/2016  T:  05/01/2016  Job:  [id number]

## 2016-05-02 ENCOUNTER — Encounter: Payer: Medicare Other | Admitting: Internal Medicine

## 2016-05-13 ENCOUNTER — Ambulatory Visit (INDEPENDENT_AMBULATORY_CARE_PROVIDER_SITE_OTHER): Payer: Medicare Other | Admitting: Internal Medicine

## 2016-05-13 ENCOUNTER — Encounter: Payer: Self-pay | Admitting: Internal Medicine

## 2016-05-13 VITALS — BP 120/60 | HR 84 | Ht 66.0 in | Wt 178.6 lb

## 2016-05-13 DIAGNOSIS — E0590 Thyrotoxicosis, unspecified without thyrotoxic crisis or storm: Secondary | ICD-10-CM | POA: Diagnosis not present

## 2016-05-13 DIAGNOSIS — I481 Persistent atrial fibrillation: Secondary | ICD-10-CM | POA: Diagnosis not present

## 2016-05-13 DIAGNOSIS — I4819 Other persistent atrial fibrillation: Secondary | ICD-10-CM

## 2016-05-13 DIAGNOSIS — Z950 Presence of cardiac pacemaker: Secondary | ICD-10-CM

## 2016-05-13 DIAGNOSIS — I495 Sick sinus syndrome: Secondary | ICD-10-CM

## 2016-05-13 MED ORDER — VALSARTAN 160 MG PO TABS: 160.0000 mg | ORAL_TABLET | Freq: Every day | ORAL | 3 refills | Status: DC

## 2016-05-13 MED ORDER — PREDNISONE 2.5 MG PO TABS: ORAL_TABLET | ORAL | 0 refills | Status: DC

## 2016-05-13 MED ORDER — DILTIAZEM HCL ER COATED BEADS 240 MG PO CP24: 240.0000 mg | ORAL_CAPSULE | Freq: Every day | ORAL | 3 refills | Status: DC

## 2016-05-13 NOTE — Patient Instructions (Signed)
Your physician has recommended you make the following change in your medication:  1.) INCREASE diltiazem to 240 mg once a day 2.) DECREASE diovan to 160 mg once a day 3.) STOP tapazole 4.) PREDNISONE 2.5 MG TABLETS:   Take 2 tablets by mouth once a day for 2 weeks, then take 1 tablet by mouth for 2 weeks.    Your physician recommends that you schedule a follow-up appointment in: 5 weeks with Chanetta Marshall, APP

## 2016-05-13 NOTE — Progress Notes (Signed)
Patient Care Team: [name], MD as PCP - General (Internal Medicine) [name], MD (Cardiology)   HPI  [name] [name] is a 82 y.o. female  Seen in followup for atrial fibrillation in the setting of modest nonobstructive coronary disease and normal left ventricular function. She is status post pacemaker implantation for sinus node dysfunction and posttermination pauses. She's been treated with flecainide and subsequently with Rythmol and most recently amiodarone. This is been complicated by hyperthyroidism. It has been discontinued. Notably she is asymptomatic and her atrial fibrillation at this time.   1/17 TSH 2.01  7/17 TSH <0.01 8/17 TSH 0.11 10/17 TSH 13.9   She's been treated w prednisone and methimazole. At tapering doses.  She was recently hospitalized with kidney stones. Her blood  cultures were positive for Enterobacter and Proteus species. She was treated with antibiotics.  recently finished.   She takes apixaban   She saw Dr. Rayann Heman to consider catheter ablation but at that juncture wanted to continue taking amiodarone.     Edema is better with the higher dose diuretics.    She also has a thoracic aortic aneurysm followed by Dr. Servando Snare     Past Medical History:  Diagnosis Date  . Ascending aorta enlargement (HCC)    4.8 cm per echo 08/07/11; 5.0 by CT in Jan 2013  . CAD (coronary artery disease)    LHC 5/08:  pOM 20%, pRCA 20-30%, EF 60%  . Hemorrhoids    current problem as of 04/26/16  with slight bleeding of hemorrhoids   . Hepatitis    hx of medication induced hepatitis (Vytorin per pt)  . History of loop recorder   . HLD (hyperlipidemia)   . HTN (hypertension)   . Hx of echocardiogram    echo 1/13: EF 55%, Gr 2 diast dysfn, Asc Ao aneurysm 4.8 cm, mild MR, mod LAE, mild RAE  . Hypothyroidism   . ICH (intracerebral hemorrhage) ([place]) 03/24/15  . NASH (nonalcoholic steatohepatitis)   . Neuromuscular disorder ([place])   . Pacemaker-Medtronic  11/12/2011   Implanted 2013   . Persistent atrial fibrillation ([place])   . PMR (polymyalgia rheumatica) (HCC)    no steriods for 5 years  . sinus node dysfunction//post termination pauses   . Stroke ([place])   . Urinary tract infection    Recurrent infections.     Past Surgical History:  Procedure Laterality Date  . CARDIAC CATHETERIZATION    . CARDIOVERSION  03/02/2012   Procedure: CARDIOVERSION;  Surgeon: Lelon Perla, MD;  Location: [facility] ENDOSCOPY;  Service: Cardiovascular;  Laterality: N/A;  . CARDIOVERSION  03/13/2012   Procedure: CARDIOVERSION;  Surgeon: Josue Hector, MD;  Location: [place];  Service: Cardiovascular;  Laterality: N/A;  . CARDIOVERSION  04/15/2012   Procedure: CARDIOVERSION;  Surgeon: [name], MD;  Location: [place];  Service: Cardiovascular;  Laterality: N/A;  . CARDIOVERSION N/A 06/07/2014   Procedure: CARDIOVERSION;  Surgeon: [name], MD;  Location: [facility] ENDOSCOPY;  Service: Cardiovascular;  Laterality: N/A;  . CARDIOVERSION N/A 08/18/2014   Procedure: CARDIOVERSION;  Surgeon: [name], MD;  Location: [place];  Service: Cardiovascular;  Laterality: N/A;  . CARDIOVERSION N/A 09/06/2015   Procedure: CARDIOVERSION;  Surgeon: Jerline Pain, MD;  Location: [place];  Service: Cardiovascular;  Laterality: N/A;  . CYSTOSCOPY WITH STENT PLACEMENT Right 04/22/2016   Procedure: CYSTOSCOPY WITH RIGHT URETERAL STENT INSERTION;  Surgeon: [name], MD;  Location: [place];  Service: Urology;  Laterality: Right;  . CYSTOSCOPY WITH URETEROSCOPY, STONE BASKETRY AND STENT PLACEMENT Right 04/30/2016   Procedure: CYSTOSCOPY WITH URETEROSCOPY, STENT PLACEMENT REMOVAL;  Surgeon: [name], MD;  [facility];  Service: Urology;  Laterality: Right;  . HERNIA REPAIR    . INCISION AND DRAINAGE BREAST ABSCESS  1973  . PACEMAKER INSERTION  Jan 2013  . PERMANENT PACEMAKER INSERTION N/A 07/29/2011   Procedure: PERMANENT PACEMAKER INSERTION;  Surgeon:  [name], MD;  Location: [facility] CATH LAB;  Service: Cardiovascular;  Laterality: N/A;  . UMBILICAL HERNIA REPAIR  1950's    Current Outpatient Prescriptions  Medication Sig Dispense Refill  . apixaban (ELIQUIS) 5 MG TABS tablet Take 1 tablet (5 mg total) by mouth 2 (two) times daily. 60 tablet 6  . Calcium Carbonate-Vitamin D (CALTRATE 600+D PO) Take 1 tablet by mouth 2 (two) times daily.    . cetirizine (ZYRTEC) 10 MG tablet Take 10 mg by mouth daily as needed for allergies.     . Cholecalciferol (VITAMIN D3) 1000 UNITS tablet Take 1,000 Units by mouth at bedtime.     Marland Kitchen diltiazem (CARDIZEM CD) 120 MG 24 hr capsule Take 1 capsule (120 mg total) by mouth daily. 90 capsule 3  . furosemide (LASIX) 20 MG tablet Take 20 mg by mouth as needed for fluid or edema.     Marland Kitchen labetalol (NORMODYNE) 300 MG tablet TAKE ONE BY MOUTH TWO TIMES A DAY. 60 tablet 8  . methimazole (TAPAZOLE) 5 MG tablet Take one tablet (5 mg) by mouth twice daily 60 tablet 3  . multivitamin (THERAGRAN) per tablet Take 1 tablet by mouth at bedtime.     . multivitamin-lutein (OCUVITE-LUTEIN) CAPS capsule Take 1 capsule by mouth daily.    . potassium chloride (K-DUR) 10 MEQ tablet Take 10 mEq by mouth 2 (two) times daily.    . traMADol (ULTRAM) 50 MG tablet Take 1 tablet (50 mg total) by mouth every 6 (six) hours as needed for moderate pain. 10 tablet 0  . valsartan (DIOVAN) 320 MG tablet Take 320 mg by mouth daily.    Earnestine Mealing 625 MG tablet Take 625 mg by mouth every morning and 1250 mg by mouth at bedtime.     No current facility-administered medications for this visit.     Allergies  Allergen Reactions  . Penicillins Anaphylaxis    Has patient had a PCN reaction causing immediate rash, facial/tongue/throat swelling, SOB or lightheadedness with hypotension: Yes Has patient had a PCN reaction causing severe rash involving mucus membranes or skin necrosis: No Has patient had a PCN reaction that required hospitalization No Has  patient had a PCN reaction occurring within the last 10 years: No If all of the above answers are "NO", then may proceed with Cephalosporin use.   . Statins Other (See Comments)    REACTION: elevated LFT's  . Tylenol [Acetaminophen] Other (See Comments)    If taken with Vytorin at risk for liver damage  . Tape Other (See Comments)    Redness, Please use "paper" tape only.  . Codeine Other (See Comments)    feel funny, head is fuzzy    Review of Systems negative except from HPI and PMH  Physical Exam BP 120/60   Pulse 84   Ht 5' 6"  (1.676 m)   Wt 178 lb 9.6 oz (81 kg)   SpO2 96%   BMI 28.83 kg/m  Well developed and nourished in no acute distress HENT normal Neck supple  flat  Clear Irregularly irregular rate and rhythm, no murmurs or gallops Abd-soft with active BS No Clubbing cyanosis  R>L tr edema.; nonpitting and involving primarily the forefoot Skin-warm and dry A & Oriented  Grossly normal sensory and motor function    ECG demonstrates Vpacing 84 Intervals-/16/47   Assessment and  Plan   Atrial fibrillaton persistent  Sinus node dysfunction   Thoracic aortic aneurysm  Hypertension  High risk medication surveillance  Pacemaker The patient's device was interrogated.  The information was reviewed. No changes were made in the programming.     Iatrogenic hyperthyroidism    Nephrolithiasis with bacteremia  Blood pressure is well-controlled. With her atrial fibrillation rates going faster in the wake of discontinuing her amiodarone, we will increase her diltiazem from 120--240 and concurrently decrease her valsartan from 320--160  We will reassess heart rate control in about 6 weeks  We will just continue her Tapazole as she is now hypothyroid; we will wean her off of her prednisone 5 mg daily times x2 week and a 2-1/2 mg daily times 2 week  I'm concerned about the bacteremia in the context of her pacemaker. She's been on persistent antibiotics. She's been  advised if she has systemic symptoms of infection she should let us know. I will also be in touch with infectious diseases as to see how we make a protocol to identify patients with bacteremia who have implanted devices

## 2016-05-19 ENCOUNTER — Ambulatory Visit (HOSPITAL_COMMUNITY)
Admission: EM | Admit: 2016-05-19 | Discharge: 2016-05-19 | Disposition: A | Discharge status: Home / Self Care | Payer: Medicare Other | Attending: Emergency Medicine | Admitting: Emergency Medicine

## 2016-05-19 ENCOUNTER — Encounter (HOSPITAL_COMMUNITY): Payer: Self-pay | Admitting: Family Medicine

## 2016-05-19 DIAGNOSIS — Z23 Encounter for immunization: Secondary | ICD-10-CM

## 2016-05-19 DIAGNOSIS — S81812A Laceration without foreign body, left lower leg, initial encounter: Secondary | ICD-10-CM | POA: Diagnosis not present

## 2016-05-19 MED ORDER — TETANUS-DIPHTH-ACELL PERTUSSIS 5-2.5-18.5 LF-MCG/0.5 IM SUSP
INTRAMUSCULAR | Status: AC
  Filled 2016-05-19: qty 0.5

## 2016-05-19 MED ORDER — ACETAMINOPHEN 325 MG PO TABS
650.0000 mg | ORAL_TABLET | Freq: Once | ORAL | Status: AC
  Administered 2016-05-19: 650 mg via ORAL

## 2016-05-19 MED ORDER — ACETAMINOPHEN 325 MG PO TABS
ORAL_TABLET | ORAL | Status: AC
  Filled 2016-05-19: qty 2

## 2016-05-19 MED ORDER — TETANUS-DIPHTH-ACELL PERTUSSIS 5-2.5-18.5 LF-MCG/0.5 IM SUSP
0.5000 mL | Freq: Once | INTRAMUSCULAR | Status: AC
  Administered 2016-05-19: 0.5 mL via INTRAMUSCULAR

## 2016-05-19 NOTE — ED Provider Notes (Signed)
CSN: [id number]     Arrival date & time 05/19/16  1257 History   First MD Initiated Contact with Patient 05/19/16 1416     Chief Complaint  Patient presents with  . Wound Check   (Consider location/radiation/quality/duration/timing/severity/associated sxs/prior Treatment) 82 yo female with a pertinent history of A-fib on eloquios presents with a laceration to the left lower leg. She was leaving church today when she hit the side of a car "maybe pipe" and tore skin from her leg. She presents for an evaluation. She did not fall. No dizziness. Having some trouble with continued bleeding.       Past Medical History:  Diagnosis Date  . Ascending aorta enlargement (HCC)    4.8 cm per echo 08/07/11; 5.0 by CT in Jan 2013  . CAD (coronary artery disease)    LHC 5/08:  pOM 20%, pRCA 20-30%, EF 60%  . Hemorrhoids    current problem as of 04/26/16  with slight bleeding of hemorrhoids   . Hepatitis    hx of medication induced hepatitis (Vytorin per pt)  . History of loop recorder   . HLD (hyperlipidemia)   . HTN (hypertension)   . Hx of echocardiogram    echo 1/13: EF 55%, Gr 2 diast dysfn, Asc Ao aneurysm 4.8 cm, mild MR, mod LAE, mild RAE  . Hypothyroidism   . ICH (intracerebral hemorrhage) ([place]) 03/24/15  . NASH (nonalcoholic steatohepatitis)   . Neuromuscular disorder ([place])   . Pacemaker-Medtronic 11/12/2011   Implanted 2013   . Persistent atrial fibrillation ([place])   . PMR (polymyalgia rheumatica) (HCC)    no steriods for 5 years  . sinus node dysfunction//post termination pauses   . Stroke ([place])   . Urinary tract infection    Recurrent infections.    Past Surgical History:  Procedure Laterality Date  . CARDIAC CATHETERIZATION    . CARDIOVERSION  03/02/2012   Procedure: CARDIOVERSION;  Surgeon: Lelon Perla, MD;  Location: [facility] ENDOSCOPY;  Service: Cardiovascular;  Laterality: N/A;  . CARDIOVERSION  03/13/2012   Procedure: CARDIOVERSION;  Surgeon: Josue Hector, MD;  Location:  [place];  Service: Cardiovascular;  Laterality: N/A;  . CARDIOVERSION  04/15/2012   Procedure: CARDIOVERSION;  Surgeon: [name], MD;  Location: [place];  Service: Cardiovascular;  Laterality: N/A;  . CARDIOVERSION N/A 06/07/2014   Procedure: CARDIOVERSION;  Surgeon: [name], MD;  Location: [facility] ENDOSCOPY;  Service: Cardiovascular;  Laterality: N/A;  . CARDIOVERSION N/A 08/18/2014   Procedure: CARDIOVERSION;  Surgeon: [name], MD;  Location: [place];  Service: Cardiovascular;  Laterality: N/A;  . CARDIOVERSION N/A 09/06/2015   Procedure: CARDIOVERSION;  Surgeon: Jerline Pain, MD;  Location: [place];  Service: Cardiovascular;  Laterality: N/A;  . CYSTOSCOPY WITH STENT PLACEMENT Right 04/22/2016   Procedure: CYSTOSCOPY WITH RIGHT URETERAL STENT INSERTION;  Surgeon: [name], MD;  Location: [place];  Service: Urology;  Laterality: Right;  . CYSTOSCOPY WITH URETEROSCOPY, STONE BASKETRY AND STENT PLACEMENT Right 04/30/2016   Procedure: CYSTOSCOPY WITH URETEROSCOPY, STENT PLACEMENT REMOVAL;  Surgeon: [name], MD;  [facility];  Service: Urology;  Laterality: Right;  . HERNIA REPAIR    . INCISION AND DRAINAGE BREAST ABSCESS  1973  . PACEMAKER INSERTION  Jan 2013  . PERMANENT PACEMAKER INSERTION N/A 07/29/2011   Procedure: PERMANENT PACEMAKER INSERTION;  Surgeon: [name], MD;  [facility] C[facility] LAB;  Service: Cardiovascular;  Laterality: N/A;  . UMBILICAL HERNIA REPAIR  1950's   Family  History  Problem Relation Age of Onset  . Coronary artery disease Mother   . Coronary artery disease    . Alzheimer's disease    . Coronary artery disease Brother   . Coronary artery disease Brother    Social History  Substance Use Topics  . Smoking status: Former Smoker    Types: Cigarettes    Quit date: 07/16/1990  . Smokeless tobacco: Never Used  . Alcohol use No   OB History    No data available     Review of Systems  All other systems reviewed  and are negative.   Allergies  Penicillins; Statins; Tylenol [acetaminophen]; Tape; and Codeine  Home Medications   Prior to Admission medications   Medication Sig Start Date End Date Taking? Authorizing Provider  apixaban (ELIQUIS) 5 MG TABS tablet Take 1 tablet (5 mg total) by mouth 2 (two) times daily. 10/24/15   Rosalin Hawking, MD  Calcium Carbonate-Vitamin D (CALTRATE 600+D PO) Take 1 tablet by mouth 2 (two) times daily.    Historical Provider, MD  cetirizine (ZYRTEC) 10 MG tablet Take 10 mg by mouth daily as needed for allergies.     Historical Provider, MD  Cholecalciferol (VITAMIN D3) 1000 UNITS tablet Take 1,000 Units by mouth at bedtime.     Historical Provider, MD  diltiazem (CARDIZEM CD) 240 MG 24 hr capsule Take 1 capsule (240 mg total) by mouth daily. 05/13/16 08/11/16  [name], MD  furosemide (LASIX) 20 MG tablet Take 20 mg by mouth as needed for fluid or edema.     Historical Provider, MD  labetalol (NORMODYNE) 300 MG tablet TAKE ONE BY MOUTH TWO TIMES A DAY. 02/06/16   [name], MD  multivitamin [facility]) per tablet Take 1 tablet by mouth at bedtime.     Historical Provider, MD  multivitamin-lutein (OCUVITE-LUTEIN) CAPS capsule Take 1 capsule by mouth daily.    Historical Provider, MD  potassium chloride (K-DUR) 10 MEQ tablet Take 10 mEq by mouth 2 (two) times daily.    Historical Provider, MD  predniSONE (DELTASONE) 2.5 MG tablet Take two tablets by mouth for 2 weeks, then take one tablet by mouth for 2 weeks then stop 05/13/16   [name], MD  traMADol (ULTRAM) 50 MG tablet Take 1 tablet (50 mg total) by mouth every 6 (six) hours as needed for moderate pain. 04/30/16   [name], MD  valsartan (DIOVAN) 160 MG tablet Take 1 tablet (160 mg total) by mouth daily. 05/13/16   [name], MD  [facility] 625 MG tablet Take 625 mg by mouth every morning and 1250 mg by mouth at bedtime. 05/08/12   Historical Provider, MD   Meds Ordered and Administered this Visit    Medications  acetaminophen (TYLENOL) tablet 650 mg (650 mg Oral Given 05/19/16 1450)  Tdap (BOOSTRIX) injection 0.5 mL (0.5 mLs Intramuscular Given 05/19/16 1456)    BP 134/64   Pulse 85   Temp 98.1 F (36.7 C)   Resp 16   SpO2 100%  No data found.   Physical Exam  Constitutional: She is oriented to person, place, and time. She appears well-developed and well-nourished. No distress.  HENT:  Head: Normocephalic and atraumatic.  Musculoskeletal: She exhibits edema.  1+ edema in bilateral LE  Neurological: She is alert and oriented to person, place, and time.  Skin: Skin is warm and dry. She is not diaphoretic.  4x5 cm skin tear to left mid shin. Minimal skin remains along  the inferior border, darkened and non-viable. No exposed tendons, bleeding contained. Mild superpicial surround swelling, 1+ edema bilateral legs  Psychiatric: Her behavior is normal.  Nursing note and vitals reviewed.   Urgent Care Course   Clinical Course     Wound closure utilizing adhes only Date/Time: 05/19/2016 3:35 PM Performed by: Bjorn Pippin Authorized by: Melony Overly  Consent: Verbal consent obtained. Written consent obtained. Consent given by: patient Patient understanding: patient states understanding of the procedure being performed Patient consent: the patient's understanding of the procedure matches consent given Procedure consent: procedure consent matches procedure scheduled Imaging studies: imaging studies not available Patient identity confirmed: verbally with patient Local anesthesia used: no  Anesthesia: Local anesthesia used: no  Sedation: Patient sedated: no Patient tolerance: Patient tolerated the procedure well with no immediate complications Comments: Non-viable skin trimmed. Wound irrigated with wound spray, non-irritating. Viable skin steri-stripped to inferior portion of wound x 2. Wound covered with oil emersion dressing, 4 x 4's and ace to hold in place.      (including critical care time)  Labs Review Labs Reviewed - No data to display  Imaging Review No results found.   Visual Acuity Review  Right Eye Distance:   Left Eye Distance:   Bilateral Distance:    Right Eye Near:   Left Eye Near:    Bilateral Near:         MDM   1. Laceration of skin of left lower leg, initial encounter    Discussed with Dr. Bridgett Larsson today who was in to see and evaluate Mrs. Kubicek today. Patient is a 82 yo with a large skin tear to left lower shin. Wound debrided and cleaned as noted in procedure. Steri-strips in place for minimal inferior portion of the wound. Dressed with wound care instructions. She is to f/u in the wound care center or with Dr. Maudie Mercury in 1 week. Any signs of worsening infection then f/u with PCP or in the ED. No antibiotics needed at this time. Td booster administered.     Bjorn Pippin, PA-C 05/19/16 1539

## 2016-05-19 NOTE — ED Triage Notes (Signed)
Pt here for wound to LLE after scraping on a car today. sts as bleeding and skin is tore off. Leg wrapped.

## 2016-05-19 NOTE — Discharge Instructions (Signed)
You have a full thickness skin tear without skin that we could not reattach. As noted by myself and Dr. Bridgett Larsson, suggest that you f/u in the wound center given the degree of tear. May need to see Dr. Maudie Mercury first for recheck and referral. Keep the dressing in place for 24 hours, then may remove. Clean lightly with anti-bacterial soap and water, do not scrub the area. Pat dry and cover with another TEGADERM dressing (get at drug store) and keep dry and in place. Do this every 24 hours. If any worsening of pain, redness or swelling of the region before your follow up with wound care then please f/u for evaluation of infection. May see your PCP or the ER if necessary. Nice to meet you feel better. May use Tylenol or Tramadol as directed for discomfort.

## 2016-05-20 LAB — CUP PACEART INCLINIC DEVICE CHECK
Battery Voltage: 2.79 V
Brady Statistic AP VS Percent: 0 %
Brady Statistic AS VP Percent: 2 %
Date Time Interrogation Session: 20171030180720
Implantable Lead Implant Date: 20130114
Implantable Lead Location: 753860
Implantable Lead Model: 1948
Lead Channel Pacing Threshold Amplitude: 0.5 V
Lead Channel Pacing Threshold Amplitude: 0.625 V
Lead Channel Pacing Threshold Pulse Width: 0.4 ms
Lead Channel Pacing Threshold Pulse Width: 0.4 ms
Lead Channel Setting Pacing Amplitude: 2 V
Lead Channel Setting Pacing Pulse Width: 0.4 ms
MDC IDC LEAD IMPLANT DT: 20130114
MDC IDC LEAD LOCATION: 753859
MDC IDC MSMT BATTERY IMPEDANCE: 309 Ohm
MDC IDC MSMT BATTERY REMAINING LONGEVITY: 102 mo
MDC IDC MSMT LEADCHNL RA IMPEDANCE VALUE: 423 Ohm
MDC IDC MSMT LEADCHNL RV IMPEDANCE VALUE: 663 Ohm
MDC IDC PG IMPLANT DT: 20130114
MDC IDC SET LEADCHNL RV PACING AMPLITUDE: 2.5 V
MDC IDC SET LEADCHNL RV SENSING SENSITIVITY: 5.6 mV
MDC IDC STAT BRADY AP VP PERCENT: 1 %
MDC IDC STAT BRADY AS VS PERCENT: 97 %

## 2016-05-23 ENCOUNTER — Telehealth: Payer: Self-pay | Admitting: Internal Medicine

## 2016-05-23 NOTE — Telephone Encounter (Signed)
I called and spoke with the patient. She reports that she scraped her leg recently and has an open wound. She was evaluated at Urgent Care and states "they couldn't do anything." She is currently trying to get in with the wound clinic. She states today that she has noticed swelling to her legs bilaterally- this has really been ongoing since the hospital. Her weight is stable today at 181 lbs- she was 181 lbs yesterday. However, her weight in July was 168 lbs and as of 10/30 she was 178 lbs. She did receive a lot of IV fluids when she was in the hospital with her kidney stones in mid October.  She is currently taking lasix 20 mg once daily. I have advised her to take an extra 20 mg of lasix for the next 3 days and then resume her normal dosing. I have advised her to call back on Monday if she is not seeing any improvement or sees no change in her urination. She voices understanding.

## 2016-05-23 NOTE — Telephone Encounter (Signed)
New message     Patient calling    Pt C/O medication issue:  1. Name of Medication: furosemide (LASIX) 20 MG tablet  2. How are you currently taking this medication (dosage and times per day)? One time a day    3. Are you having a reaction (difficulty breathing--STAT)? No   4. What is your medication issue? Patient verbalized  - will explain to nurse when she calls back today. Wants to take an extra pills

## 2016-05-27 ENCOUNTER — Other Ambulatory Visit: Payer: Self-pay

## 2016-05-27 DIAGNOSIS — I480 Paroxysmal atrial fibrillation: Secondary | ICD-10-CM

## 2016-05-27 MED ORDER — APIXABAN 5 MG PO TABS: 5.0000 mg | ORAL_TABLET | Freq: Two times a day (BID) | ORAL | 3 refills | Status: DC

## 2016-05-28 ENCOUNTER — Telehealth: Payer: Self-pay | Admitting: Internal Medicine

## 2016-05-28 NOTE — Telephone Encounter (Signed)
Follow Up:   Pt says the Lasix is not working.The first day it did good,after that it did not work. She also has a wound on her leg that is infected.Please call her to advise.

## 2016-05-28 NOTE — Telephone Encounter (Signed)
I called and spoke with the patient.  She states that she did have increased urination after taking the increased dose of lasix on 11/10, but her weights have remained stable at 180-181 lbs. She is still having swelling to her bilateral lower extremities although the side with the wound that she reported last week is larger. She feels the wound on her leg is infected as it is red around the area, but not draining anything. She is afebrile. She is due to see the wound clinic in the morning at 8:00 am. I have advised her to follow up there in the morning and to call me back at the [place] office on Thursday morning to update me. I will then be able to review with Dr. Caryl Comes.

## 2016-05-29 ENCOUNTER — Encounter (HOSPITAL_BASED_OUTPATIENT_CLINIC_OR_DEPARTMENT_OTHER): Payer: Medicare Other | Attending: Surgery

## 2016-05-29 DIAGNOSIS — M1990 Unspecified osteoarthritis, unspecified site: Secondary | ICD-10-CM | POA: Insufficient documentation

## 2016-05-29 DIAGNOSIS — L97222 Non-pressure chronic ulcer of left calf with fat layer exposed: Secondary | ICD-10-CM | POA: Insufficient documentation

## 2016-05-29 DIAGNOSIS — I4891 Unspecified atrial fibrillation: Secondary | ICD-10-CM | POA: Insufficient documentation

## 2016-05-29 DIAGNOSIS — Z7901 Long term (current) use of anticoagulants: Secondary | ICD-10-CM | POA: Diagnosis not present

## 2016-05-29 DIAGNOSIS — S81812A Laceration without foreign body, left lower leg, initial encounter: Secondary | ICD-10-CM | POA: Insufficient documentation

## 2016-05-29 DIAGNOSIS — X58XXXA Exposure to other specified factors, initial encounter: Secondary | ICD-10-CM | POA: Diagnosis not present

## 2016-05-29 DIAGNOSIS — L03116 Cellulitis of left lower limb: Secondary | ICD-10-CM | POA: Insufficient documentation

## 2016-05-29 DIAGNOSIS — I2510 Atherosclerotic heart disease of native coronary artery without angina pectoris: Secondary | ICD-10-CM | POA: Diagnosis not present

## 2016-05-29 DIAGNOSIS — Z950 Presence of cardiac pacemaker: Secondary | ICD-10-CM | POA: Insufficient documentation

## 2016-05-29 DIAGNOSIS — Z87891 Personal history of nicotine dependence: Secondary | ICD-10-CM | POA: Diagnosis not present

## 2016-05-29 DIAGNOSIS — I10 Essential (primary) hypertension: Secondary | ICD-10-CM | POA: Insufficient documentation

## 2016-05-30 ENCOUNTER — Telehealth: Payer: Self-pay | Admitting: Internal Medicine

## 2016-05-30 NOTE — Telephone Encounter (Signed)
I called and spoke with the patient. She states she saw the wound clinic yesterday. Her right leg swelling was much improved. She is still having quite a bit to her left leg, this is the side of her leg wound. She reports that the wound clinic has placed her on an oral antibiotic and a topical cream.  They have recommended that she wear a compression stocking on this leg as well. I have advised her that it does sound like her swelling is more of a venous issue at this time. She does have a prescription strength support sock, but states she is unsure if she will be able to get this over her leg due to the swelling and bandage that is there. I have advised her to start with a more firm support knee sock and then progress to the RX strength support sock as tolerated. We will leave her diuretics as they are right now as her weight is stable and swelling is more unilateral at this time. I have advised her to call back should she have increased weight gain (3 lb or more in 24 hours/ 5 lbs in 1 week), abdominal swelling, or increased SOB. She will follow up with the wound clinic weekly until improvement in made with her wound.

## 2016-05-30 NOTE — Telephone Encounter (Signed)
Blain Pais at 05/30/2016 9:55 AM   Status: Signed    Pt needs to discuss her swelling in her legs. Please call

## 2016-05-30 NOTE — Telephone Encounter (Signed)
Pt needs to discuss her swelling in her legs. Please call.

## 2016-05-30 NOTE — Telephone Encounter (Signed)
Previous encounter open for the same issue.  Will close this encounter.

## 2016-06-03 ENCOUNTER — Other Ambulatory Visit: Payer: Self-pay | Admitting: Cardiothoracic Surgery

## 2016-06-03 DIAGNOSIS — I7789 Other specified disorders of arteries and arterioles: Secondary | ICD-10-CM

## 2016-06-04 DIAGNOSIS — S81812A Laceration without foreign body, left lower leg, initial encounter: Secondary | ICD-10-CM | POA: Diagnosis not present

## 2016-06-11 DIAGNOSIS — S81812A Laceration without foreign body, left lower leg, initial encounter: Secondary | ICD-10-CM | POA: Diagnosis not present

## 2016-06-13 ENCOUNTER — Encounter: Payer: Self-pay | Admitting: *Deleted

## 2016-06-18 ENCOUNTER — Encounter (HOSPITAL_BASED_OUTPATIENT_CLINIC_OR_DEPARTMENT_OTHER): Payer: Medicare Other | Attending: Surgery

## 2016-06-18 DIAGNOSIS — I10 Essential (primary) hypertension: Secondary | ICD-10-CM | POA: Diagnosis not present

## 2016-06-18 DIAGNOSIS — I2510 Atherosclerotic heart disease of native coronary artery without angina pectoris: Secondary | ICD-10-CM | POA: Diagnosis not present

## 2016-06-18 DIAGNOSIS — L97822 Non-pressure chronic ulcer of other part of left lower leg with fat layer exposed: Secondary | ICD-10-CM | POA: Diagnosis present

## 2016-06-19 NOTE — Progress Notes (Signed)
Electrophysiology Office Note Date: 06/20/2016  ID:  Nathan, Stallworth Jan 04, 1931, MRN [id number]  PCP: [name], MD Electrophysiologist: Caryl Comes  CC: Pacemaker follow-up  [name] [name] is a 82 y.o. female seen today for Dr Caryl Comes.  She presents today for routine electrophysiology followup.  Since last being seen in our clinic, the patient reports doing reasonably well.  She has had increased LE edema.  She also had a leg laceration due to "stumbling" that is taking a lot of time to heal.  She denies chest pain, palpitations, dyspnea, PND, orthopnea, nausea, vomiting, dizziness, syncope, edema, weight gain, or early satiety.  Device History: MDT dual chamber PPM implanted 2013 for tachy/brady    Past Medical History:  Diagnosis Date  . Ascending aorta enlargement (HCC)    4.8 cm per echo 08/07/11; 5.0 by CT in Jan 2013  . CAD (coronary artery disease)    LHC 5/08:  pOM 20%, pRCA 20-30%, EF 60%  . Hemorrhoids    current problem as of 04/26/16  with slight bleeding of hemorrhoids   . Hepatitis    hx of medication induced hepatitis (Vytorin per pt)  . History of loop recorder   . HLD (hyperlipidemia)   . HTN (hypertension)   . Hx of echocardiogram    echo 1/13: EF 55%, Gr 2 diast dysfn, Asc Ao aneurysm 4.8 cm, mild MR, mod LAE, mild RAE  . Hypothyroidism   . ICH (intracerebral hemorrhage) ([place]) 03/24/15  . NASH (nonalcoholic steatohepatitis)   . Neuromuscular disorder ([place])   . Pacemaker-Medtronic 11/12/2011   Implanted 2013   . Persistent atrial fibrillation ([place])   . PMR (polymyalgia rheumatica) (HCC)    no steriods for 5 years  . sinus node dysfunction//post termination pauses   . Stroke ([place])   . Urinary tract infection    Recurrent infections.    Past Surgical History:  Procedure Laterality Date  . CARDIAC CATHETERIZATION    . CARDIOVERSION  03/02/2012   Procedure: CARDIOVERSION;  Surgeon: Lelon Perla, MD;  Location: [facility] ENDOSCOPY;  Service: Cardiovascular;   Laterality: N/A;  . CARDIOVERSION  03/13/2012   Procedure: CARDIOVERSION;  Surgeon: Josue Hector, MD;  Location: [facility];  Service: Cardiovascular;  Laterality: N/A;  . CARDIOVERSION  04/15/2012   Procedure: CARDIOVERSION;  Surgeon: [name], MD;  Location: [facility];  Service: Cardiovascular;  Laterality: N/A;  . CARDIOVERSION N/A 06/07/2014   Procedure: CARDIOVERSION;  Surgeon: [name], MD;  Location: [facility] ENDOSCOPY;  Service: Cardiovascular;  Laterality: N/A;  . CARDIOVERSION N/A 08/18/2014   Procedure: CARDIOVERSION;  Surgeon: [name], MD;  Location: [facility];  Service: Cardiovascular;  Laterality: N/A;  . CARDIOVERSION N/A 09/06/2015   Procedure: CARDIOVERSION;  Surgeon: Jerline Pain, MD;  Location: [facility];  Service: Cardiovascular;  Laterality: N/A;  . CYSTOSCOPY WITH STENT PLACEMENT Right 04/22/2016   Procedure: CYSTOSCOPY WITH RIGHT URETERAL STENT INSERTION;  Surgeon: [name], MD;  Location: [place] [facility];  Service: Urology;  Laterality: Right;  . CYSTOSCOPY WITH URETEROSCOPY, STONE BASKETRY AND STENT PLACEMENT Right 04/30/2016   Procedure: CYSTOSCOPY WITH URETEROSCOPY, STENT PLACEMENT REMOVAL;  Surgeon: [name], MD;  Location: [facility];  Service: Urology;  Laterality: Right;  . HERNIA REPAIR    . INCISION AND DRAINAGE BREAST ABSCESS  1973  . PACEMAKER INSERTION  Jan 2013  . PERMANENT PACEMAKER INSERTION N/A 07/29/2011   Procedure: PERMANENT PACEMAKER INSERTION;  Surgeon: [name], MD;  [facility] C[facility] LAB;  Service: Cardiovascular;  Laterality: N/A;  . UMBILICAL HERNIA REPAIR  1950's    Current Outpatient Prescriptions  Medication Sig Dispense Refill  . apixaban (ELIQUIS) 5 MG TABS tablet Take 1 tablet (5 mg total) by mouth 2 (two) times daily. 180 tablet 3  . Calcium Carbonate-Vitamin D (CALTRATE 600+D PO) Take 1 tablet by mouth 2 (two) times daily.    . cetirizine (ZYRTEC) 10 MG tablet Take 10 mg by mouth daily as needed for allergies.      . Cholecalciferol (VITAMIN D3) 1000 UNITS tablet Take 1,000 Units by mouth at bedtime.     Marland Kitchen diltiazem (CARDIZEM CD) 240 MG 24 hr capsule Take 1 capsule (240 mg total) by mouth daily. 90 capsule 3  . furosemide (LASIX) 20 MG tablet Take 20 mg by mouth as needed for fluid or edema.     Marland Kitchen labetalol (NORMODYNE) 300 MG tablet TAKE ONE BY MOUTH TWO TIMES A DAY. 60 tablet 8  . levothyroxine (SYNTHROID, LEVOTHROID) 25 MCG tablet Take 25 mcg by mouth daily before breakfast.    . multivitamin (THERAGRAN) per tablet Take 1 tablet by mouth at bedtime.     . multivitamin-lutein (OCUVITE-LUTEIN) CAPS capsule Take 1 capsule by mouth daily.    . potassium chloride (K-DUR) 10 MEQ tablet Take 10 mEq by mouth 2 (two) times daily.    . traMADol (ULTRAM) 50 MG tablet Take 1 tablet (50 mg total) by mouth every 6 (six) hours as needed for moderate pain. 10 tablet 0  . valsartan (DIOVAN) 160 MG tablet Take 1 tablet (160 mg total) by mouth daily. 90 tablet 3  . WELCHOL 625 MG tablet Take 625 mg by mouth every morning and 1250 mg by mouth at bedtime.     No current facility-administered medications for this visit.     Allergies:   Penicillins; Statins; Tylenol [acetaminophen]; Tape; and Codeine   Social History: Social History   Social History  . Marital status: Married    Spouse name: N/A  . Number of children: N/A  . Years of education: N/A   Occupational History  . Not on file.   Social History Main Topics  . Smoking status: Former Smoker    Types: Cigarettes    Quit date: 07/16/1990  . Smokeless tobacco: Never Used  . Alcohol use No  . Drug use: No  . Sexual activity: Not on file   Other Topics Concern  . Not on file   Social History Narrative   Pt lives in [place] with spouse.  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 Coronary artery disease Mother   . Coronary artery disease    . Alzheimer's disease     . Coronary artery disease Brother   . Coronary artery disease Brother      Review of Systems: All other systems reviewed and are otherwise negative except as noted above.   Physical Exam: VS:  BP 126/67   Pulse 80   Ht 5' 6"  (1.676 m)   Wt 176 lb (79.8 kg)   BMI 28.41 kg/m  , BMI Body mass index is 28.41 kg/m.  GEN- The patient is elderly appearing, alert and oriented x 3 today.   HEENT: normocephalic, atraumatic; sclera clear, conjunctiva pink; hearing intact; oropharynx clear; neck supple  Lungs- Clear to ausculation bilaterally, normal work of breathing.  No wheezes, rales, rhonchi Heart- Irregular rate and rhythm, no murmurs, rubs  or gallops  GI- soft, non-tender, non-distended, bowel sounds present  Extremities- no clubbing, cyanosis, 1+ BLE edema, +compression hose  MS- no significant deformity or atrophy Skin- warm and dry, no rash or lesion; PPM pocket well healed Psych- euthymic mood, full affect Neuro- strength and sensation are intact  PPM Interrogation- reviewed in detail today,  See PACEART report  EKG:  EKG is not ordered today.  Recent Labs: 09/04/2015: Magnesium 1.9 04/24/2016: TSH 13.943 04/25/2016: ALT 59; Platelets 215 04/30/2016: BUN 16; Creatinine, Ser 1.10; Hemoglobin 12.2; Potassium 3.6; Sodium 142   Wt Readings from Last 3 Encounters:  06/20/16 176 lb (79.8 kg)  05/13/16 178 lb 9.6 oz (81 kg)  04/30/16 186 lb (84.4 kg)     Other studies Reviewed: Additional studies/ records that were reviewed today include: Dr Olin Pia office notes  Assessment and Plan:  1.  Tachy/brady syndrome Normal PPM function See Pace Art report No changes today  2.  Permanent atrial fibrillation V rates controlled Continue Eliquis for CHADS2VASC of 7  3.  LE edema Continue compression hose I have advised to take double Lasix for next 3 days and then call back with update. It may be that she is going to require more diuresis, but I also suspect venous  insufficiency plays a large role.   BMET, BNP today  Echo 03/2015 demonstrated EF 55-60%   Current medicines are reviewed at length with the patient today.   The patient does not have concerns regarding her medicines.  The following changes were made today:  Increase Lasix for next 3 days   Labs/ tests ordered today include:  Orders Placed This Encounter  Procedures  . Basic metabolic panel  . Brain natriuretic peptide     Disposition:   Follow up with [facility] in 3 months, Dr Caryl Comes in 6 months     Signed, Chanetta Marshall, NP 06/20/2016 11:58 AM  [facility] [address] [place] [address] [phone] (office) [phone] (fax)

## 2016-06-20 ENCOUNTER — Ambulatory Visit (INDEPENDENT_AMBULATORY_CARE_PROVIDER_SITE_OTHER): Payer: Medicare Other | Admitting: Nurse Practitioner

## 2016-06-20 VITALS — BP 126/67 | HR 80 | Ht 66.0 in | Wt 176.0 lb

## 2016-06-20 DIAGNOSIS — R600 Localized edema: Secondary | ICD-10-CM

## 2016-06-20 DIAGNOSIS — I495 Sick sinus syndrome: Secondary | ICD-10-CM

## 2016-06-20 DIAGNOSIS — I482 Chronic atrial fibrillation: Secondary | ICD-10-CM | POA: Diagnosis not present

## 2016-06-20 DIAGNOSIS — I4821 Permanent atrial fibrillation: Secondary | ICD-10-CM

## 2016-06-20 LAB — BASIC METABOLIC PANEL
BUN: 10 mg/dL (ref 7–25)
CALCIUM: 9.2 mg/dL (ref 8.6–10.4)
CO2: 25 mmol/L (ref 20–31)
Chloride: 108 mmol/L (ref 98–110)
Creat: 1.23 mg/dL — ABNORMAL HIGH (ref 0.60–0.88)
Glucose, Bld: 83 mg/dL (ref 65–99)
POTASSIUM: 4 mmol/L (ref 3.5–5.3)
SODIUM: 141 mmol/L (ref 135–146)

## 2016-06-20 LAB — BRAIN NATRIURETIC PEPTIDE: BRAIN NATRIURETIC PEPTIDE: 207.3 pg/mL — AB (ref <100)

## 2016-06-20 NOTE — Patient Instructions (Addendum)
Medication Instructions:   FOR THREE DAYS ONLY TAKE   1. LASIX 40 MG DAILY   2. POTASSIUM 20 MEQ DAILY   THEN GO BACK TO NORMAL DOSE    If you need a refill on your cardiac medications before your next appointment, please call your pharmacy.  Labwork: BMET AND BNP    Testing/Procedures: NONE ORDERED  TODAY   Follow-Up:  3 MONTHS  WITH  DR Caryl Comes    Remote monitoring is used to monitor your Pacemaker of ICD from home. This monitoring reduces the number of office visits required to check your device to one time per year. It allows [place] to keep an eye on the functioning of your device to ensure it is working properly. You are scheduled for a device check from home on . 09/19/16.You may send your transmission at any time that day. If you have a wireless device, the transmission will be sent automatically. After your physician reviews your transmission, you will receive a postcard with your next transmission date.     Any Other Special Instructions Will Be Listed Below (If Applicable).

## 2016-06-21 LAB — CUP PACEART INCLINIC DEVICE CHECK
Implantable Lead Location: 753860
Implantable Lead Model: 1948
Implantable Lead Model: 5076
Implantable Pulse Generator Implant Date: 20130114
MDC IDC LEAD IMPLANT DT: 20130114
MDC IDC LEAD IMPLANT DT: 20130114
MDC IDC LEAD LOCATION: 753859
MDC IDC SESS DTM: 20171208095423

## 2016-06-25 DIAGNOSIS — L97822 Non-pressure chronic ulcer of other part of left lower leg with fat layer exposed: Secondary | ICD-10-CM | POA: Diagnosis not present

## 2016-07-02 DIAGNOSIS — L97822 Non-pressure chronic ulcer of other part of left lower leg with fat layer exposed: Secondary | ICD-10-CM | POA: Diagnosis not present

## 2016-07-04 ENCOUNTER — Ambulatory Visit
Admission: RE | Admit: 2016-07-04 | Discharge: 2016-07-04 | Disposition: A | Discharge status: Home / Self Care | Payer: Medicare Other | Source: Ambulatory Visit | Attending: Cardiothoracic Surgery | Admitting: Cardiothoracic Surgery

## 2016-07-04 ENCOUNTER — Ambulatory Visit: Payer: Medicare Other | Admitting: Cardiothoracic Surgery

## 2016-07-04 DIAGNOSIS — I7789 Other specified disorders of arteries and arterioles: Secondary | ICD-10-CM

## 2016-07-04 MED ORDER — IOPAMIDOL (ISOVUE-370) INJECTION 76%: 60.0000 mL | Freq: Once | INTRAVENOUS | Status: DC | PRN

## 2016-07-09 DIAGNOSIS — L97822 Non-pressure chronic ulcer of other part of left lower leg with fat layer exposed: Secondary | ICD-10-CM | POA: Diagnosis not present

## 2016-07-16 ENCOUNTER — Encounter (HOSPITAL_BASED_OUTPATIENT_CLINIC_OR_DEPARTMENT_OTHER): Payer: Medicare Other | Attending: Surgery

## 2016-07-16 DIAGNOSIS — I10 Essential (primary) hypertension: Secondary | ICD-10-CM | POA: Insufficient documentation

## 2016-07-16 DIAGNOSIS — I2510 Atherosclerotic heart disease of native coronary artery without angina pectoris: Secondary | ICD-10-CM | POA: Insufficient documentation

## 2016-07-16 DIAGNOSIS — X58XXXA Exposure to other specified factors, initial encounter: Secondary | ICD-10-CM | POA: Diagnosis not present

## 2016-07-16 DIAGNOSIS — Z950 Presence of cardiac pacemaker: Secondary | ICD-10-CM | POA: Diagnosis not present

## 2016-07-16 DIAGNOSIS — Z87891 Personal history of nicotine dependence: Secondary | ICD-10-CM | POA: Diagnosis not present

## 2016-07-16 DIAGNOSIS — S81812A Laceration without foreign body, left lower leg, initial encounter: Secondary | ICD-10-CM | POA: Insufficient documentation

## 2016-07-16 DIAGNOSIS — I481 Persistent atrial fibrillation: Secondary | ICD-10-CM | POA: Diagnosis not present

## 2016-07-16 DIAGNOSIS — Z7901 Long term (current) use of anticoagulants: Secondary | ICD-10-CM | POA: Insufficient documentation

## 2016-07-23 DIAGNOSIS — S81812A Laceration without foreign body, left lower leg, initial encounter: Secondary | ICD-10-CM | POA: Diagnosis not present

## 2016-07-25 ENCOUNTER — Ambulatory Visit (INDEPENDENT_AMBULATORY_CARE_PROVIDER_SITE_OTHER): Payer: Medicare Other | Admitting: Cardiothoracic Surgery

## 2016-07-25 ENCOUNTER — Encounter: Payer: Self-pay | Admitting: Cardiothoracic Surgery

## 2016-07-25 VITALS — BP 112/53 | HR 78 | Resp 16 | Ht 66.0 in | Wt 176.0 lb

## 2016-07-25 DIAGNOSIS — I712 Thoracic aortic aneurysm, without rupture, unspecified: Secondary | ICD-10-CM

## 2016-07-25 NOTE — Progress Notes (Signed)
[address]       [place] [address]             [phone]        Eira W Buddenh[facility] [place] Medical Record #[id number] Date of Birth: 1931-06-14  Referring: [name], MD Primary Care: [name], MD  Chief Complaint:    Chief Complaint  Patient presents with  . Thoracic Aortic Aneurysm    8 month f/u with CTA Chest 07/04/16    History of Present Illness:    Patient is a 83 -year-old female with a history of atrial fib on Coumadin In the past, now on a look was after she had a "brain bleed" .  July 29 2011 a pacemaker was placed. A followup an echocardiogram was performed that demonstrated dilated descending aorta leading to  evaluation with a CT scan of the chest .  She has no history of aortic valve disease.  The patient returns today with a followup CT scan for comparison to the scan done in 8 months ago.  She has no family history of aneurysm disease or sudden death, her grandfather did die in his 36s of a cerebral aneurysm.  She has been diligent about blood pressure control and taking her beta blocker but notes it is as high as 140's /80's.   She was on amiodarone, there was a concern by cardiology about gait disturbance, she notes she still has some instability with ambulation. Now off off Amiodarone   Patient denies any shortness of breath or chest discomfort at rest, some mild sob with exertion , no angina.   She does note over the past year she had "stroke" described as a cerebral bleed , with residual tremor of her hands .  Since I first started seeing the patient she is become increasingly frail.    Current Activity/ Functional Status: Patient is independent with mobility/ambulation, transfers, ADL's, IADL's.   Zubrod Score: At the time of surgery this patient's most appropriate activity status/level should be described as: []     0    Normal activity, no symptoms []     1    Restricted in physical strenuous activity but ambulatory,  able to do out light work [x]     2    Ambulatory and capable of self care, unable to do work activities, up and about >50 % of waking hours                              []     3    Only limited self care, in bed greater than 50% of waking hours []     4    Completely disabled, no self care, confined to bed or chair []     5    Moribund   Past Medical History:  Diagnosis Date  . Ascending aorta enlargement (HCC)    4.8 cm per echo 08/07/11; 5.0 by CT in Jan 2013  . CAD (coronary artery disease)    LHC 5/08:  pOM 20%, pRCA 20-30%, EF 60%  . Hemorrhoids    current problem as of 04/26/16  with slight bleeding of hemorrhoids   . Hepatitis    hx of medication induced hepatitis (Vytorin per pt)  . History of loop recorder   . HLD (hyperlipidemia)   . HTN (hypertension)   . Hx of echocardiogram    echo 1/13: EF 55%, Gr 2 diast dysfn,  Asc Ao aneurysm 4.8 cm, mild MR, mod LAE, mild RAE  . Hypothyroidism   . ICH (intracerebral hemorrhage) ([facility]) 03/24/15  . NASH (nonalcoholic steatohepatitis)   . Neuromuscular disorder ([facility])   . Pacemaker-Medtronic 11/12/2011   Implanted 2013   . Persistent atrial fibrillation ([facility])   . PMR (polymyalgia rheumatica) (HCC)    no steriods for 5 years  . sinus node dysfunction//post termination pauses   . Stroke ([facility] [place])   . Urinary tract infection    Recurrent infections.     Past Surgical History:  Procedure Laterality Date  . CARDIAC CATHETERIZATION    . CARDIOVERSION  03/02/2012   Procedure: CARDIOVERSION;  Surgeon: Lelon Perla, MD;  Location: [facility] ENDOSCOPY;  Service: Cardiovascular;  Laterality: N/A;  . CARDIOVERSION  03/13/2012   Procedure: CARDIOVERSION;  Surgeon: Josue Hector, MD;  Location: [facility];  Service: Cardiovascular;  Laterality: N/A;  . CARDIOVERSION  04/15/2012   Procedure: CARDIOVERSION;  Surgeon: [name], MD;  Location: [facility];  Service: Cardiovascular;  Laterality: N/A;  . CARDIOVERSION N/A 06/07/2014   Procedure:  CARDIOVERSION;  Surgeon: [name], MD;  Location: [facility] ENDOSCOPY;  Service: Cardiovascular;  Laterality: N/A;  . CARDIOVERSION N/A 08/18/2014   Procedure: CARDIOVERSION;  Surgeon: [name], MD;  Location: [place];  Service: Cardiovascular;  Laterality: N/A;  . CARDIOVERSION N/A 09/06/2015   Procedure: CARDIOVERSION;  Surgeon: Jerline Pain, MD;  Location: [facility];  Service: Cardiovascular;  Laterality: N/A;  . CYSTOSCOPY WITH STENT PLACEMENT Right 04/22/2016   Procedure: CYSTOSCOPY WITH RIGHT URETERAL STENT INSERTION;  Surgeon: [name], MD;  Location: [facility];  Service: Urology;  Laterality: Right;  . CYSTOSCOPY WITH URETEROSCOPY, STONE BASKETRY AND STENT PLACEMENT Right 04/30/2016   Procedure: CYSTOSCOPY WITH URETEROSCOPY, STENT PLACEMENT REMOVAL;  Surgeon: [name], MD;  Location: [facility];  Service: Urology;  Laterality: Right;  . HERNIA REPAIR    . INCISION AND DRAINAGE BREAST ABSCESS  1973  . PACEMAKER INSERTION  Jan 2013  . PERMANENT PACEMAKER INSERTION N/A 07/29/2011   Procedure: PERMANENT PACEMAKER INSERTION;  Surgeon: [name], MD;  Location: [facility] CATH LAB;  Service: Cardiovascular;  Laterality: N/A;  . UMBILICAL HERNIA REPAIR  1950's    Family History  Problem Relation Age of Onset  . Coronary artery disease Mother   . Coronary artery disease    . Alzheimer's disease    . Coronary artery disease Brother   . Coronary artery disease Brother       History  Smoking Status  . Former Smoker  . Types: Cigarettes  . Quit date: 07/16/1990  Smokeless Tobacco  . Never Used    History  Alcohol Use No     Allergies  Allergen Reactions  . Penicillins Anaphylaxis    Has patient had a PCN reaction causing immediate rash, facial/tongue/throat swelling, SOB or lightheadedness with hypotension: Yes Has patient had a PCN reaction causing severe rash involving mucus membranes or skin necrosis: No Has patient had a PCN reaction that required hospitalization  No Has patient had a PCN reaction occurring within the last 10 years: No If all of the above answers are "NO", then may proceed with Cephalosporin use.   . Statins Other (See Comments)    REACTION: elevated LFT's  . Tylenol [Acetaminophen] Other (See Comments)    If taken with Vytorin at risk for liver damage  . Tape Other (See Comments)    Redness, Please use "paper" tape only.  . Codeine Other (  See Comments)    feel funny, head is fuzzy    Current Outpatient Prescriptions  Medication Sig Dispense Refill  . apixaban (ELIQUIS) 5 MG TABS tablet Take 1 tablet (5 mg total) by mouth 2 (two) times daily. 180 tablet 3  . Calcium Carbonate-Vitamin D (CALTRATE 600+D PO) Take 1 tablet by mouth 2 (two) times daily.    . cetirizine (ZYRTEC) 10 MG tablet Take 10 mg by mouth daily as needed for allergies.     . Cholecalciferol (VITAMIN D3) 1000 UNITS tablet Take 1,000 Units by mouth at bedtime.     Marland Kitchen diltiazem (CARDIZEM CD) 240 MG 24 hr capsule Take 1 capsule (240 mg total) by mouth daily. 90 capsule 3  . furosemide (LASIX) 20 MG tablet Take 20 mg by mouth as needed for fluid or edema.     Marland Kitchen labetalol (NORMODYNE) 300 MG tablet TAKE ONE BY MOUTH TWO TIMES A DAY. 60 tablet 8  . levothyroxine (SYNTHROID, LEVOTHROID) 25 MCG tablet Take 25 mcg by mouth daily before breakfast.    . multivitamin (THERAGRAN) per tablet Take 1 tablet by mouth at bedtime.     . multivitamin-lutein (OCUVITE-LUTEIN) CAPS capsule Take 1 capsule by mouth daily.    . potassium chloride (K-DUR) 10 MEQ tablet Take 10 mEq by mouth 2 (two) times daily.    . traMADol (ULTRAM) 50 MG tablet Take 1 tablet (50 mg total) by mouth every 6 (six) hours as needed for moderate pain. 10 tablet 0  . valsartan (DIOVAN) 160 MG tablet Take 1 tablet (160 mg total) by mouth daily. 90 tablet 3  . WELCHOL 625 MG tablet Take 625 mg by mouth every morning and 1250 mg by mouth at bedtime.     No current facility-administered medications for this visit.         Review of Systems:     Cardiac Review of Systems: Y or N  Chest Pain [ n   ]  Resting SOB [   ] Exertional SOB  [n  ]  Orthopnea Florencio.Farrier ]   Pedal Edema [ n  ]    Palpitations Blue.Reese  ] Syncope  Florencio.Farrier  ]   Presyncope Florencio.Farrier   ]  General Review of Systems: [Y] = yes [  ]=no Constitional: recent weight change [  ]; anorexia [  ]; fatigue [  ]; nausea [  ]; night sweats [  ]; fever [  ]; or chills [  ];                                                                                                                                          Dental: poor dentition[  n]; Last Dentist visit: regular every 6 months  Eye : blurred vision [  ]; diplopia [   ]; vision changes [  ];  Amaurosis fugax[  ]; Resp: cough [  ];  wheezing[  ];  hemoptysis[  ]; shortness of breath[  ]; paroxysmal nocturnal dyspnea[  ]; dyspnea on exertion[  ]; or orthopnea[  ];  GI:  gallstones[  ], vomiting[  ];  dysphagia[  ]; melena[  ];  hematochezia [  ]; heartburn[  ];   Hx of  Colonoscopy[  ]; GU: kidney stones [  ]; hematuria[  ];   dysuria [  ];  nocturia[  ];  history of     obstruction [  ];             Skin: rash, swelling[  ];, hair loss[  ];  peripheral edema[  ];  or itching[  ]; Musculosketetal: myalgias[ y ];  joint swelling[ y ];  joint erythema[  ];  joint pain[  ];  back pain[  ];  Heme/Lymph: bruising[ n ];  bleeding[  n];  anemia[  ];  Neuro: TIA[  ];  headaches[  ];  stroke[  ];  vertigo[  ];  seizures[  ];   paresthesias[  ];  difficulty walking[  ];  Psych:depression[  ]; anxiety[  ];  Endocrine: diabetes[ n ];  thyroid dysfunction[ n ];  Immunizations: Flu Blue.Reese  ]; Pneumococcal[y  ];  Other:  Physical Exam: BP (!) 112/53 (BP Location: Right Arm, Patient Position: Sitting, Cuff Size: Large)   Pulse 78   Resp 16   Ht 5' 6"  (1.676 m)   Wt 79.8 kg (176 lb)   BMI 28.41 kg/m   General appearance: alert, cooperative, appears stated age and no distress, she has increased difficulty standing up from sitting and  moves slowly  Neurologic: intact Heart: irregularly irregular rhythm, she has no murmur of aortic insufficiency or MR Lungs: clear to auscultation bilaterally Abdomen: soft, non-tender; bowel sounds normal; no masses,  no organomegaly Extremities: extremities normal, atraumatic, no cyanosis  and Homans sign is negative, no sign of DVT, does have venous statis changes both lower extremities. Patient has 2+ pitting edema No carotid bruits, 1+ dp and PT pulses  Diagnostic Studies & Laboratory data:     Recent Radiology Findings:  Ct Angio Chest Aorta W/cm &/or Wo/cm  Result Date: 07/04/2016 CLINICAL DATA:  FU TAA WO RUPTURE X 3 YRS NO PERTINENT SX NO HX CA DM HX PACEMAKER HTN PRIORS IN PACS EXAM: CT ANGIOGRAPHY CHEST WITH CONTRAST TECHNIQUE: Multidetector CT imaging of the chest was performed using the standard protocol during bolus administration of intravenous contrast. Multiplanar CT image reconstructions and MIPs were obtained to evaluate the vascular anatomy. CONTRAST:  75 mL Isovue-300 IV GFR = 41, USE 60 CC CONTRAST PER DR WAGNER COMPARISON:  11/01/2015 FINDINGS: Cardiovascular: Left subclavian Transvenous pacing leads extend into the right atrium and towards the right ventricular apex. Dilated central pulmonary arteries. Incomplete opacification of pulmonary artery branches ; the exam was not optimized for detection of pulmonary emboli. Patent bilateral pulmonary veins drain into the left atrium. Left atrial enlargement. Scattered coronary calcifications. Thoracic aortic aneurysm with measurements as follows: 3.6 cm sinuses of Valsalva 3.4 cm sino-tubular junction 4.9 cm proximal ascending (stable since previous exam) 4 cm distal ascending/proximal arch 3 cm distal arch 3.5 cm proximal descending 3.7 cm saccular aneurysm distal descending at the level of the right lateral penetrating atheromatous ulcer, previously 3.6 cm 2.7 cm distal descending above the diaphragm. There is classic 3 vessel  brachiocephalic arterial origin anatomy without proximal stenosis. Moderate calcified plaque throughout the thoracic aorta. Mediastinum/Nodes: No pericardial effusion. No mediastinal hematoma.  subcentimeter mediastinal lymph nodes. No hilar adenopathy. Lungs/Pleura: No pleural effusion. No pneumothorax. Linear scarring posteriorly at the right lung base. Upper Abdomen: No acute abnormality. Stable 15 mm exophytic probable cyst from the upper pole right kidney. Musculoskeletal: Anterior vertebral endplate spurring at multiple levels in the mid and lower thoracic spine. Review of the MIP images confirms the above findings. IMPRESSION: 1. Stable 4.9 cm ascending aortic aneurysm without complicating features. 2. Continued enlargement of eccentric penetrating atheromatous ulcer with small saccular aneurysm in the distal descending thoracic aorta. Electronically Signed   By: Lucrezia Europe M.D.   On: 07/04/2016 16:56   Ct Angio Chest Aorta W/cm &/or Wo/cm  11/01/2015  CLINICAL DATA:  Followup thoracic aortic aneurysm EXAM: CT ANGIOGRAPHY CHEST WITH CONTRAST TECHNIQUE: Multidetector CT imaging of the chest was performed using the standard protocol during bolus administration of intravenous contrast. Multiplanar CT image reconstructions and MIPs were obtained to evaluate the vascular anatomy. Creatinine was obtained on site at [place] at [address] Results: Creatinine 1.0 mg/dL. CONTRAST:  75 mL Isovue 370. COMPARISON:  10/20/2014 FINDINGS: Vascular: The thoracic aorta is again well visualized. There is dilatation of the ascending aorta stable at 4.9 cm. Some mild motion artifact is noted. It measures approximately 33 mm at the sino-tubular junction an approximately 36 mm at the level of the sinus of Valsalva. Stable diameter of 3.1 cm in the distal aortic arch is noted. Some tapering is seen distal similar to that noted on the prior exam. Calcific plaque is seen without evidence of dissection. Although  not timed for pulmonary embolism evaluation no large central embolus is noted within the pulmonary artery. The brachiocephalic vessels appear within normal limits with mild atherosclerotic changes. The visualized portions of the upper abdominal visceral vessels appear patent. Nonvascular: Some minimal scarring is noted in the right lower lobe stable from the prior exam. No focal infiltrate or sizable effusion is seen. No sizable parenchymal nodule is seen. Mild foci pleural thickening are seen posteriorly particularly on the left. No sizable hilar or mediastinal adenopathy is noted. The visualized portions of the upper abdomen are within normal limits. A small exophytic cyst is again noted from the upper pole of the right kidney. No acute bony abnormality is seen. Review of the MIP images confirms the above findings. IMPRESSION: Stable appearing aneurysmal dilatation of the thoracic aorta as described. Ascending thoracic aortic aneurysm. Recommend semi-annual imaging followup by CTA or MRA and referral to cardiothoracic surgery if not already obtained. This recommendation follows 2010 ACCF/AHA/AATS/ACR/ASA/SCA/SCAI/SIR/STS/SVM Guidelines for the Diagnosis and Management of Patients With Thoracic Aortic Disease. Circulation. 2010; 121: D220-U542 No new focal abnormality is seen. Electronically Signed   By: Inez Catalina M.D.   On: 11/01/2015 15:02   Ct Angio Chest Aorta W/cm &/or Wo/cm  11/04/2013   CLINICAL DATA:  Ascending aortic enlargement  EXAM: CT ANGIOGRAPHY CHEST WITH CONTRAST  TECHNIQUE: Multidetector CT imaging of the chest was performed using the standard protocol during bolus administration of intravenous contrast. Multiplanar CT image reconstructions and MIPs were obtained to evaluate the vascular anatomy.  CONTRAST:  27m OMNIPAQUE IOHEXOL 350 MG/ML SOLN  COMPARISON:  09/23/2012  FINDINGS: Stable small hypodense thyroid lesions. Dual lead pacer noted. At the level of the pulmonary artery on image 57  of series 4, the ascending aortic transverse caliber is 4.9 cm, no change from 09/23/2012. Central arch 3.2 cm on image 28 of series 4 (stable) and proximal descending thoracic aorta 3.1 cm (likewise stable)  if scattered atherosclerotic calcification involving the ascending aorta, aortic arch, descending thoracic aorta, and branch vasculature noted. Mild cardiomegaly is again noted. No pathologic thoracic adenopathy or pericardial effusion.  There is a slightly greater amount of right eccentric mural thrombus in the descending thoracic aorta adjacent to the aortic hiatus on image 89 of series 4. The celiac trunk and nodes prompt proximal branches appear patent. An exophytic right kidney upper pole lesion is likely a cyst, measuring 13 Hounsfield units.  Stable mild right lower lobe scarring. Thoracic kyphosis and thoracic spondylosis noted.  Review of the MIP images confirms the above findings.  IMPRESSION: 1. No change in size or appearance of ascending aortic aneurysm (which measures up to 4.9 cm) or descending thoracic aortic ectasia. 2. Thoracic kyphosis and spondylosis.   Electronically Signed   By: Sherryl Barters M.D.   On: 11/04/2013 09:10   Ct Angio Chest W/cm &/or Wo Cm  09/23/2012  *RADIOLOGY REPORT*  Clinical Data: Follow-up ascending aortic aneurysm.  CT ANGIOGRAPHY CHEST  Technique:  Multidetector CT imaging of the chest using the standard protocol during bolus administration of intravenous contrast. Multiplanar reconstructed images including MIPs were obtained and reviewed to evaluate the vascular anatomy.  Contrast: 17m OMNIPAQUE IOHEXOL 350 MG/ML SOLN  Comparison: 01/28/2012.  Findings: Low attenuation lesions in the thyroid measure up to 1.4 cm on the right, as before.  Ascending aorta measures up to 4.9 cm, unchanged from the prior examination.  Transverse aorta measures up to 3.2 cm and proximal descending thoracic aorta, 3.1 cm, also unchanged.  There is atherosclerotic irregularity and  calcification of the aorta with calcification seen in the coronary arteries. Heart is mildly enlarged.  No pericardial effusion.  No pathologically enlarged mediastinal, hilar or axillary lymph nodes.  Scattered scarring in the right lower lobe and along the left major fissure.  No pleural fluid.  Airway is unremarkable.  Incidental imaging of the upper abdomen shows no acute findings. No worrisome lytic or sclerotic lesions.  IMPRESSION:  1.  Stable ascending aortic aneurysm. Transverse and descending thoracic aortic diameters are stable as well. 2.  Coronary artery calcification.   Original Report Authenticated By: MLorin Picket M .     Followup CT scan of the chest done July 2013 Ct Angio Chest Aorta W/cm &/or Wo/cm  10/20/2014   CLINICAL DATA:  Thoracic aortic aneurysm.  EXAM: CT ANGIOGRAPHY CHEST WITH CONTRAST  TECHNIQUE: Multidetector CT imaging of the chest was performed using the standard protocol during bolus administration of intravenous contrast. Multiplanar CT image reconstructions and MIPs were obtained to evaluate the vascular anatomy.  CONTRAST:  75 mL of Isovue 370 intravenously.  COMPARISON:  CT scan of November 04, 2013.  FINDINGS: No pneumothorax or pleural effusion is noted. No acute pulmonary disease is noted. Ascending thoracic aorta has maximum measured diameter of 4.9 cm which is not significantly changed compared to prior exam. Transverse aortic arch measures 3.1 cm which is not significantly changed compared to prior exam. Proximal portion of descending thoracic aorta measures 3.1 cm which is not significantly changed compared to prior exam. No dissection is noted. Atherosclerotic calcifications are again noted. No mediastinal mass or adenopathy is noted. Left-sided pacemaker is unchanged in position. Pulmonary arteries appear grossly normal. Stable mild cardiomegaly is noted. Stable exophytic cyst is seen arising from upper pole of right kidney.  Review of the MIP images confirms the  above findings.  IMPRESSION: Stable ascending thoracic aortic aneurysm with maximum measured diameter 4.9 cm. No  significant changes noted compared to prior exam.   Electronically Signed   By: Marijo Conception, M.D.   On: 10/20/2014 13:17    Ct Angio Chest W/cm &/or Wo Cm  01/28/2012  *RADIOLOGY REPORT*  Clinical Data: Follow-up aortic aneurysm.  CT ANGIOGRAPHY CHEST  Technique:  Multidetector CT imaging of the chest using the standard protocol during bolus administration of intravenous contrast. Multiplanar reconstructed images including MIPs were obtained and reviewed to evaluate the vascular anatomy.  Contrast: 13m OMNIPAQUE IOHEXOL 300 MG/ML  SOLN  Comparison: 08/13/2011  Findings: Again noted is the mild aneurysmal dilatation of the ascending aorta, currently measuring maximally 4.7 cm compared 5.0 cm previously.  No dissection or significant change.  Proximal descending thoracic aorta measures maximally 3.1 cm.  Descending thoracic aorta at the aortic hiatus measures 2.8 cm.  Distal thoracic aortic calcifications noted.  Left-sided pacer remains in place, unchanged.  Coronary artery calcifications present. No mediastinal, hilar, or axillary adenopathy.  Linear scarring in the lung bases.  Lungs otherwise clear.  No pleural effusions.  No suspicious pulmonary nodules or masses.  Visualized chest wall soft tissues are unremarkable.  Imaging into the upper abdomen demonstrates no acute findings.  No acute bony abnormality.  Mild degenerative changes and kyphosis in the thoracic spine.  IMPRESSION: Stable ascending aortic aneurysm, measuring maximally 4.7 cm.  Coronary artery disease.  Original Report Authenticated By: KRaelyn Number M.D.    Recent Lab Findings: Lab Results  Component Value Date   WBC 7.7 04/25/2016   HGB 12.2 04/30/2016   HCT 36.0 04/30/2016   PLT 215 04/25/2016   GLUCOSE 83 06/20/2016   CHOL 181 03/27/2015   TRIG 96 03/27/2015   HDL 64 03/27/2015   LDLCALC 98 03/27/2015    ALT 59 (H) 04/25/2016   AST 30 04/25/2016   NA 141 06/20/2016   K 4.0 06/20/2016   CL 108 06/20/2016   CREATININE 1.23 (H) 06/20/2016   BUN 10 06/20/2016   CO2 25 06/20/2016   TSH 13.943 (H) 04/24/2016   INR 1.07 04/30/2016   HGBA1C 5.5 03/27/2015      Assessment / Plan:  1/  ascending aortic aneurysm (which measures up to 4.9 cm)  with no change by CTA over the past year          Aortic Size Index=      4.9 /Body surface area is 1.93 meters squared. = 2.5 index                       < 2.75 cm/m2      4% risk per year               2.75 to 4.25          8% risk per year              > 4.25 cm/m2    20% risk per year  2/descending thoracic aortic ectasia 3/Thoracic kyphosis and spondylosis 4/ history of afib on coumadin  I have reviewed the patient'sCT scan, she does have a dilated ascending aorta approximately 4.9 cm maximum diameter without evidence of aortic valve disease. There has been no change over a since 2013  . At this point with her age of 89years size less then 5.5 cm and index 2.47  I would not recommend elective repair of her ascending aorta as her yearly risk from aortic  rupture is 4%. I've discussed with her the risks  of dissection and consequences of that versus the risk of elective repair. She understands a good blood pressure control including beta blockers important. We'll plan to see her back in approximately 12 months with a followup CTA of the aorta to evaluate any change in size. With her advancing age and fragility  history of stroke and anticoagulation with a nonreversible agent I also discussed with her the place for surgical intervention and replacement of her aorta in an emergency situation. She will discuss this with family.    Grace Isaac MD  Beeper [phone] Office [phone] 07/25/2016 10:16 AM

## 2016-07-30 DIAGNOSIS — S81812A Laceration without foreign body, left lower leg, initial encounter: Secondary | ICD-10-CM | POA: Diagnosis not present

## 2016-08-07 DIAGNOSIS — S81812A Laceration without foreign body, left lower leg, initial encounter: Secondary | ICD-10-CM | POA: Diagnosis not present

## 2016-08-13 DIAGNOSIS — S81812A Laceration without foreign body, left lower leg, initial encounter: Secondary | ICD-10-CM | POA: Diagnosis not present

## 2016-08-20 ENCOUNTER — Encounter (HOSPITAL_BASED_OUTPATIENT_CLINIC_OR_DEPARTMENT_OTHER): Payer: Medicare Other | Attending: Surgery

## 2016-08-20 DIAGNOSIS — Z09 Encounter for follow-up examination after completed treatment for conditions other than malignant neoplasm: Secondary | ICD-10-CM | POA: Diagnosis not present

## 2016-09-05 ENCOUNTER — Ambulatory Visit: Payer: Medicare Other | Admitting: Neurology

## 2016-09-16 ENCOUNTER — Other Ambulatory Visit: Payer: Self-pay | Admitting: Internal Medicine

## 2016-09-16 IMAGING — CT CT ANGIO CHEST
2 of 5 series · 9 of 30 positions shown · IV contrast (75CC ISOVUE 370)
Comparison: CT scan of November 04, 2013.

CLINICAL DATA: Thoracic aortic aneurysm.

EXAM:
CT ANGIOGRAPHY CHEST WITH CONTRAST
TECHNIQUE: Multidetector CT imaging of the chest was performed using the
standard protocol during bolus administration of intravenous
contrast. Multiplanar CT image reconstructions and MIPs were
obtained to evaluate the vascular anatomy.
CONTRAST:  75 mL of Isovue 370 intravenously.

[Series 4: angio · axial · 0.62mm/px · z∈[-267,-34]mm · 5 of 141 slices shown]
[im 24/141  lung]
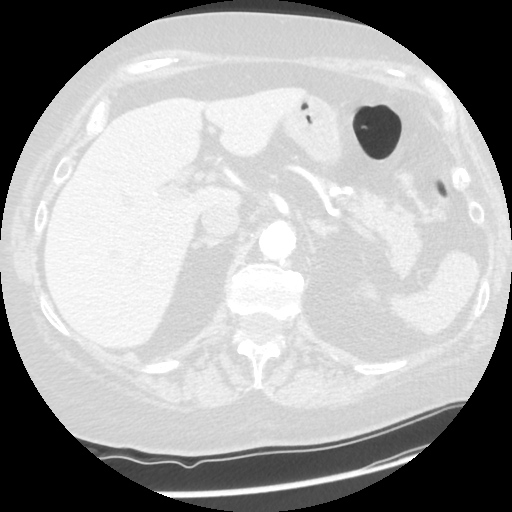
[im 47/141  mediastinal]
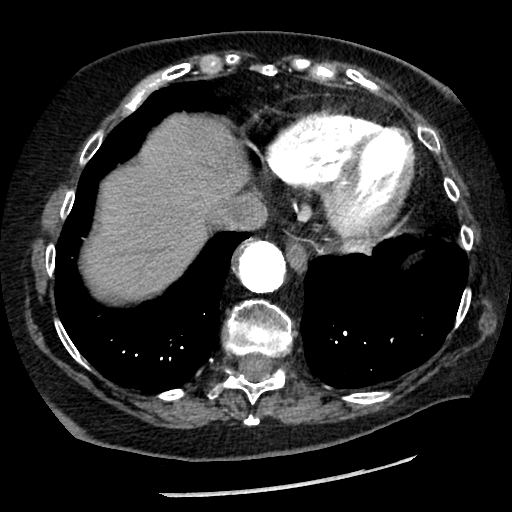
[im 71/141  lung]
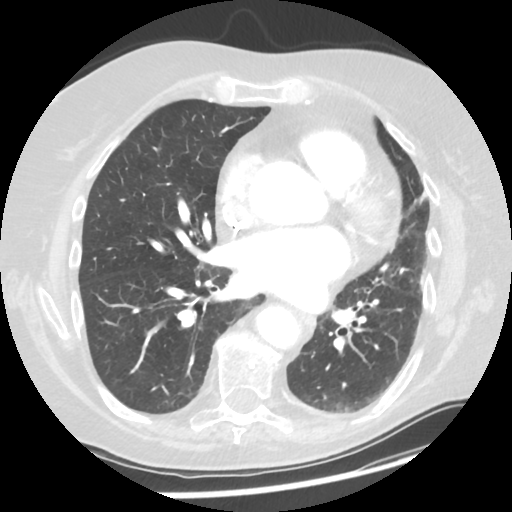
[im 94/141  mediastinal]
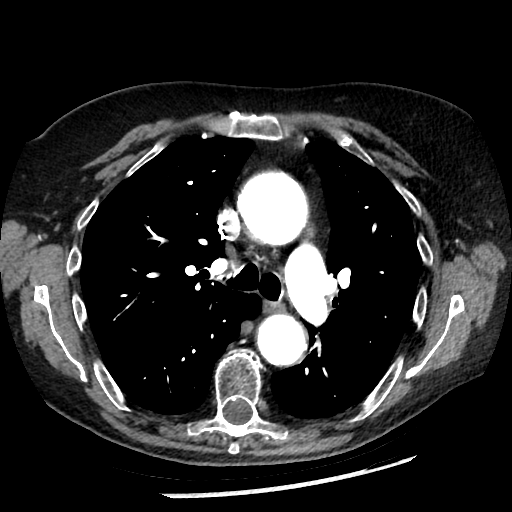
[im 117/141  lung]
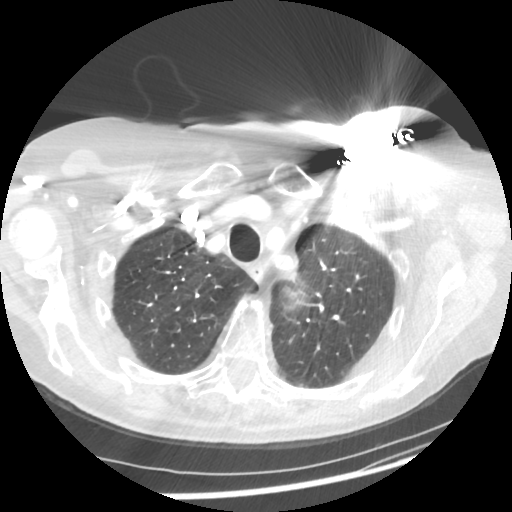

[Series 602: sagittal body · sagittal · 0.68mm/px · 4 of 129 slices shown]
[im 26/129  lung]
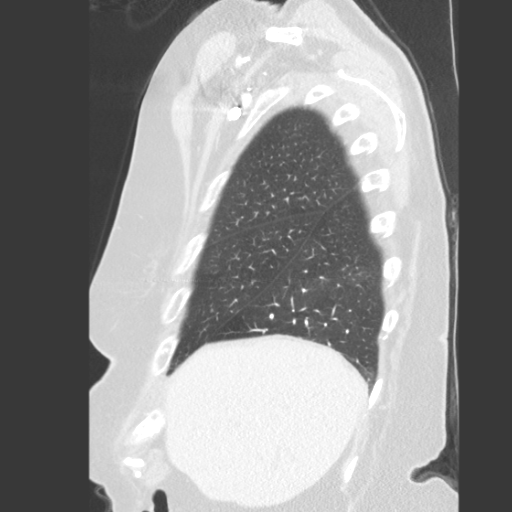
[im 52/129  lung]
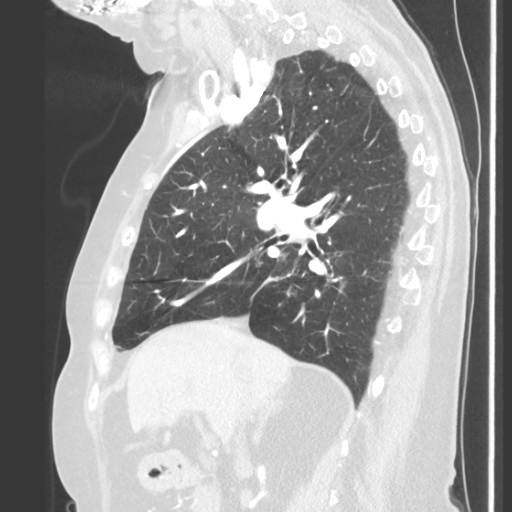
[im 77/129  lung]
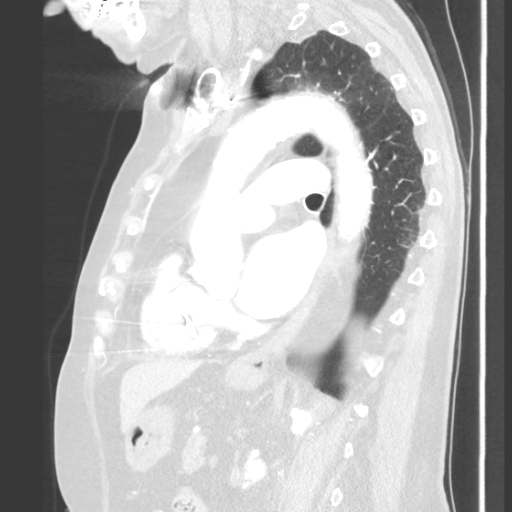
[im 103/129  lung]
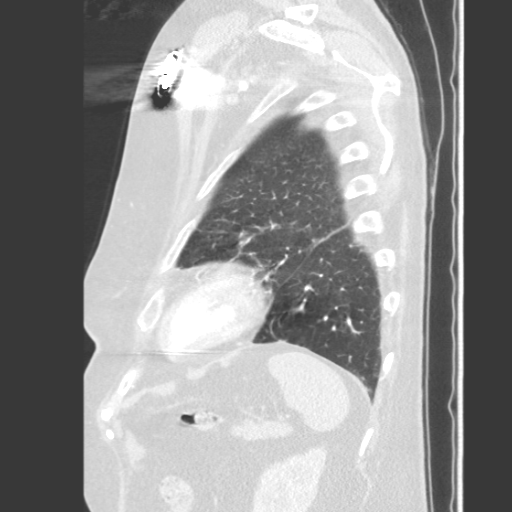

[9 of 30 positions shown; findings below may reference images not displayed]

FINDINGS: No pneumothorax or pleural effusion is noted. No acute pulmonary
disease is noted. Ascending thoracic aorta has maximum measured
diameter of 4.9 cm which is not significantly changed compared to
prior exam. Transverse aortic arch measures 3.1 cm which is not
significantly changed compared to prior exam. Proximal portion of
descending thoracic aorta measures 3.1 cm which is not significantly
changed compared to prior exam. No dissection is noted.
Atherosclerotic calcifications are again noted. No mediastinal mass
or adenopathy is noted. Left-sided pacemaker is unchanged in
position. Pulmonary arteries appear grossly normal. Stable mild
cardiomegaly is noted. Stable exophytic cyst is seen arising from
upper pole of right kidney.

Review of the MIP images confirms the above findings.
IMPRESSION: Stable ascending thoracic aortic aneurysm with maximum measured
diameter 4.9 cm. No significant changes noted compared to prior
exam.

## 2016-09-17 ENCOUNTER — Ambulatory Visit (INDEPENDENT_AMBULATORY_CARE_PROVIDER_SITE_OTHER): Payer: Medicare Other | Admitting: Neurology

## 2016-09-17 ENCOUNTER — Encounter: Payer: Self-pay | Admitting: Neurology

## 2016-09-17 VITALS — BP 137/77 | HR 87 | Wt 165.8 lb

## 2016-09-17 DIAGNOSIS — Z950 Presence of cardiac pacemaker: Secondary | ICD-10-CM | POA: Diagnosis not present

## 2016-09-17 DIAGNOSIS — I4819 Other persistent atrial fibrillation: Secondary | ICD-10-CM

## 2016-09-17 DIAGNOSIS — Z7901 Long term (current) use of anticoagulants: Secondary | ICD-10-CM | POA: Diagnosis not present

## 2016-09-17 DIAGNOSIS — I481 Persistent atrial fibrillation: Secondary | ICD-10-CM

## 2016-09-17 DIAGNOSIS — I7789 Other specified disorders of arteries and arterioles: Secondary | ICD-10-CM

## 2016-09-17 DIAGNOSIS — I611 Nontraumatic intracerebral hemorrhage in hemisphere, cortical: Secondary | ICD-10-CM | POA: Diagnosis not present

## 2016-09-17 NOTE — Patient Instructions (Addendum)
-   continue eliquis 32m twice a day for stroke prevention - bleeding precaution - follow up with cardiology for afib management - follow up with [facility] for AAA monitoring  - check BP at home and record - Follow up with your primary care physician for stroke risk factor modification. Recommend maintain blood pressure goal <130/80, diabetes with hemoglobin A1c goal below 6.5% and lipids with LDL cholesterol goal below 70 mg/dL.  - follow up as needed.

## 2016-09-17 NOTE — Progress Notes (Signed)
STROKE NEUROLOGY FOLLOW UP NOTE  NAME: [name] [name] DOB: 1930-11-08  REASON FOR VISIT: stroke follow up HISTORY FROM: pt and chart  Today we had the pleasure of seeing [name] [name] in follow-up at our Neurology Clinic. Pt was accompanied by husband.   History Summary [name] [name] is a 83 y.o. Female with hx of a-fib on coumadin s/p pacemaker, HTN, CAD, HLD admitted on 03/25/15 for L parietal ICH. INR 2.35, reversed with Kcentra. INR down to 1.13. Symptoms resolved and repeat CT stable. Her BP was low while in hospital and her BP meds has to be hold initially and put back gradually as BP getting up. Still holding off hydralazine on discharge. Plan to repeat CT in one month and restart AC. Pt is discharged in good condition.  Follow up 05/02/15 - the patient has been doing well. However, she went to ER on 04/01/15 for elevated BP and got CT head showed evolving ICH. She went to ER again on 04/06/15 for frontal HA, got CT head again for further evolving ICH. She follows with Dr. Caryl Comes in clinic for afib, on amiodarone and nadolol. Currently BP 120-130 at home. Today BP 114/68. She has thoracic aorta aneurysm, in 07/2011 was 4.8cm and in 03/2015 it showed 4.4 cm.   Follow up 09/05/15 - pt doing well from stroke standpoint. No recurrent symptoms. No bleeding side effects on eliquis. She still has intermittent afib symptomatic. This time again since Thursday. Will have appointment with cardiology to do cardioversion tomorrow. BP today in clinic 95/62. However pt checks her BP at home always 120-130s.  03/04/16 follow up - pt has been doing well from stroke standpoint. She follows with cardiology and stopped amiodarone due to hyperthyroidism, on prednisone and methimazole. Currently still in afib but not symptomatic. She also had repeat CTA chest in 10/2015 and AAA is stable at 4.9cm. BP 123/72.   Interval History During the interval time, pt has been doing well from stroke standpoint. She was  treated for hyperthyroidism due to amiodarone use and then became hypothyroidism and now on levothyroxine. She also followed with [facility] and AAA stable at 4.9 cm and recommend to repeat CTA in one year. BP good at home, today 137/77.  REVIEW OF SYSTEMS: Full 14 system review of systems performed and notable only for those listed below and in HPI above, all others are negative:  Constitutional:   Cardiovascular:  Ear/Nose/Throat:   Skin:  Eyes:   Respiratory:   Gastroitestinal:   Genitourinary:  Hematology/Lymphatic:   Endocrine:  Musculoskeletal:  Joint pain Allergy/Immunology:   Neurological:  Psychiatric:  Sleep:   The following represents the patient's updated allergies and side effects list: Allergies  Allergen Reactions  . Penicillins Anaphylaxis    Has patient had a PCN reaction causing immediate rash, facial/tongue/throat swelling, SOB or lightheadedness with hypotension: Yes Has patient had a PCN reaction causing severe rash involving mucus membranes or skin necrosis: No Has patient had a PCN reaction that required hospitalization No Has patient had a PCN reaction occurring within the last 10 years: No If all of the above answers are "NO", then may proceed with Cephalosporin use.   . Statins Other (See Comments)    REACTION: elevated LFT's  . Tylenol [Acetaminophen] Other (See Comments)    If taken with Vytorin at risk for liver damage  . Tape Other (See Comments)    Redness, Please use "paper" tape only.  . Codeine Other (See Comments)  feel funny, head is fuzzy    The neurologically relevant items on the patient's problem list were reviewed on today's visit.  Neurologic Examination  A problem focused neurological exam (12 or more points of the single system neurologic examination, vital signs counts as 1 point, cranial nerves count for 8 points) was performed.  Blood pressure 137/77, pulse 87, weight 165 lb 12.8 oz (75.2 kg).  General - Well nourished, well  developed, in no apparent distress.  Ophthalmologic - Sharp disc margins OU.   Cardiovascular - irregularly irregular heart rate and rhythm.  Mental Status -  Level of arousal and orientation to time, place, and person were intact. Language including expression, naming, repetition, comprehension was assessed and found intact. Fund of Knowledge was assessed and was intact.  Cranial Nerves II - XII - II - Visual field intact OU. III, IV, VI - Extraocular movements intact. V - Facial sensation intact bilaterally. VII - Facial movement intact bilaterally. VIII - Hearing & vestibular intact bilaterally. X - Palate elevates symmetrically. XI - Chin turning & shoulder shrug intact bilaterally. XII - Tongue protrusion intact.  Motor Strength - The patient's strength was normal in all extremities and pronator drift was absent.  Bulk was normal and fasciculations were absent.   Motor Tone - Muscle tone was assessed at the neck and appendages and was normal.  Reflexes - The patient's reflexes were 1+ in all extremities and she had no pathological reflexes.  Sensory - Light touch, temperature/pinprick were assessed and were normal.    Coordination - The patient had normal movements in the hands and feet with no ataxia or dysmetria.  Tremor was absent.  Gait and Station - mildly slow and cautious gait, but steady.  Data reviewed: I personally reviewed the images and agree with the radiology interpretations.  Ct Head Wo Contrast  03/26/2015 IMPRESSION: Slight interval decrease in size of left occipital intraparenchymal hemorrhage. No new acute intracranial finding.   03/26/2015 IMPRESSION: Stable LEFT parietal parasagittal intraparenchymal hematoma as described. No significant increase in hematoma or edema compared with yesterday's CT.   03/25/2015 IMPRESSION: Acute left posterior parietal intraparenchymal hemorrhage is noted.   04/01/15 - Slight increase in the size of left  posterior parietal hematoma with development of 6 mm satellite hemorrhage as well, new from most recent prior study.  04/06/15 - Continued presence of intraparenchymal hemorrhage in left posterior parietal cortex with stable amount of surrounding white matter edema. Stable small satellite hemorrhage is noted more superiorly toward the vertex. No significant midline shift is noted.  2D echo - - Compared to a prior echo in 2013, there are few changes. Pacemaker wires are now noted. There is no longer a pericadrial effusion. The ascending aorta is dilated to 4.4 cm at the most distally visualized point. Consider dedicated imaging of the aorta wtih CT. There is moderate TR with RVSP of 42 mmHg, the RA pressure is estimated at 8 mmHg.  Component     Latest Ref Rng 03/27/2015  Cholesterol     0 - 200 mg/dL 181  Triglycerides     <150 mg/dL 96  HDL Cholesterol     >40 mg/dL 64  Total CHOL/HDL Ratio      2.8  VLDL     0 - 40 mg/dL 19  LDL (calc)     0 - 99 mg/dL 98  Hemoglobin A1C     4.8 - 5.6 % 5.5  Mean Plasma Glucose      111  Component     Latest Ref Rng & Units 07/27/2015 01/25/2016 02/26/2016 04/24/2016  TSH     0.350 - 4.500 uIU/mL 2.066 <0.010 (L) 0.11 (L) 13.943 (H)    Assessment: As you may recall, she is a 83 y.o. Caucasian female with PMH of a-fib on coumadin s/p pacemaker, HTN, CAD, HLD admitted on 03/25/15 for L parietal ICH. INR 2.35, reversed with Kcentra. INR down to 1.13. Symptoms resolved and repeat CT stable. AC on hold. Pt is discharged in good condition. During the interval time, she had two head CT showing evolving hematoma. BP well controlled with meds. No neuro deficit residue. She follows with Dr. Caryl Comes in clinic for afib, on amiodarone and nadolol. She has thoracic aorta aneurysm, in 07/2011 was 4.8cm and in 10/2014 and 10/2015 and 06/2016 it is stable at 4.9 cm. Restarted on eliquis 49m bid and tolerating well. Still has paroxysmal afib and followed  with cardiology. Amiodarone stopped due to hyperthyroidism, on prednisone taper and methimazole, then became hypothyroidism and now on levothyroxine. BP stable  Plan:  - continue eliquis 510mtwice a day for stroke prevention - bleeding precaution - follow up with cardiology for afib management - follow up with [facility] for AAA monitoring  - check BP at home and record - Follow up with your primary care physician for stroke risk factor modification. Recommend maintain blood pressure goal <130/80, diabetes with hemoglobin A1c goal below 6.5% and lipids with LDL cholesterol goal below 70 mg/dL.  - follow up as needed.  I spent more than 25 minutes of face to face time with the patient. Greater than 50% of time was spent in counseling and coordination of care. We discussed eliquis dosing management, bleeding precautions, and follow up with cardiology and [facility].   No orders of the defined types were placed in this encounter.   Meds ordered this encounter  Medications  . valsartan (DIOVAN) 320 MG tablet    Sig: Take by mouth.    There are no Patient Instructions on file for this visit.  JiRosalin HawkingMD PhD [facility] [id number] [phone]

## 2016-09-19 ENCOUNTER — Encounter: Payer: Medicare Other | Admitting: Internal Medicine

## 2016-09-24 ENCOUNTER — Ambulatory Visit (INDEPENDENT_AMBULATORY_CARE_PROVIDER_SITE_OTHER): Payer: Medicare Other | Admitting: Internal Medicine

## 2016-09-24 ENCOUNTER — Encounter: Payer: Self-pay | Admitting: Internal Medicine

## 2016-09-24 VITALS — BP 130/66 | HR 88 | Ht 66.0 in | Wt 164.4 lb

## 2016-09-24 DIAGNOSIS — I495 Sick sinus syndrome: Secondary | ICD-10-CM

## 2016-09-24 DIAGNOSIS — Z950 Presence of cardiac pacemaker: Secondary | ICD-10-CM

## 2016-09-24 DIAGNOSIS — I482 Chronic atrial fibrillation: Secondary | ICD-10-CM

## 2016-09-24 DIAGNOSIS — I4821 Permanent atrial fibrillation: Secondary | ICD-10-CM

## 2016-09-24 LAB — CUP PACEART INCLINIC DEVICE CHECK
Battery Remaining Longevity: 101 mo
Battery Voltage: 2.79 V
Date Time Interrogation Session: 20180313152318
Implantable Lead Implant Date: 20130114
Implantable Lead Location: 753859
Implantable Lead Location: 753860
Implantable Pulse Generator Implant Date: 20130114
Lead Channel Impedance Value: 441 Ohm
Lead Channel Pacing Threshold Amplitude: 0.5 V
Lead Channel Sensing Intrinsic Amplitude: 22.4 mV
Lead Channel Setting Pacing Amplitude: 2.5 V
Lead Channel Setting Pacing Pulse Width: 0.4 ms
MDC IDC LEAD IMPLANT DT: 20130114
MDC IDC MSMT BATTERY IMPEDANCE: 333 Ohm
MDC IDC MSMT LEADCHNL RA SENSING INTR AMPL: 1 mV
MDC IDC MSMT LEADCHNL RV IMPEDANCE VALUE: 726 Ohm
MDC IDC MSMT LEADCHNL RV PACING THRESHOLD AMPLITUDE: 0.5 V
MDC IDC MSMT LEADCHNL RV PACING THRESHOLD PULSEWIDTH: 0.4 ms
MDC IDC MSMT LEADCHNL RV PACING THRESHOLD PULSEWIDTH: 0.4 ms
MDC IDC SET LEADCHNL RV SENSING SENSITIVITY: 5.6 mV
MDC IDC STAT BRADY RV PERCENT PACED: 44 %

## 2016-09-24 NOTE — Patient Instructions (Addendum)
Medication Instructions: - Your physician recommends that you continue on your current medications as directed. Please refer to the Current Medication list given to you today.  Labwork: - none ordered  Procedures/Testing: - none ordered  Follow-Up: - Remote monitoring is used to monitor your Pacemaker of ICD from home. This monitoring reduces the number of office visits required to check your device to one time per year. It allows [place] to keep an eye on the functioning of your device to ensure it is working properly. You are scheduled for a device check from home on 12/24/16. You may send your transmission at any time that day. If you have a wireless device, the transmission will be sent automatically. After your physician reviews your transmission, you will receive a postcard with your next transmission date.  - Your physician wants you to follow-up in: January 2019- with Tommye Standard, PA for Dr. Caryl Comes. You will receive a reminder letter in the mail two months in advance. If you don't receive a letter, please call our office to schedule the follow-up appointment.   Any Additional Special Instructions Will Be Listed Below (If Applicable).     If you need a refill on your cardiac medications before your next appointment, please call your pharmacy.

## 2016-09-24 NOTE — Progress Notes (Signed)
Patient Care Team: [name], MD as PCP - General (Internal Medicine) [name], MD (Cardiology)   HPI  [name] [name] is a 83 y.o. female  Seen in followup for atrial fibrillation in the setting of modest nonobstructive coronary disease and normal left ventricular function. She is status post pacemaker implantation for sinus node dysfunction and posttermination pauses. She's been treated with flecainide and subsequently with Rythmol and most recently amiodarone. This is been complicated by hyperthyroidism. It has been discontinued. Notably she is asymptomatic and her atrial fibrillation at this time.   1/17 TSH 2.01  7/17 TSH <0.01 8/17 TSH 0.11 10/17 TSH 13.9   She is now on synthroid and feeling muchv better   She takes apixaban;  She has hx of L parietal ICH  Seen recently by neuro and felt to be stable     Saw Dr. Rayann Heman to consider catheter ablation- chose to continue amio  Now At Fib permanent    Edema is better with the higher dose diuretics.    She also has a thoracic aortic aneurysm followed by Dr. Servando Snare     Past Medical History:  Diagnosis Date  . Ascending aorta enlargement (HCC)    4.8 cm per echo 08/07/11; 5.0 by CT in Jan 2013  . CAD (coronary artery disease)    LHC 5/08:  pOM 20%, pRCA 20-30%, EF 60%  . Hemorrhoids    current problem as of 04/26/16  with slight bleeding of hemorrhoids   . Hepatitis    hx of medication induced hepatitis (Vytorin per pt)  . History of loop recorder   . HLD (hyperlipidemia)   . HTN (hypertension)   . Hx of echocardiogram    echo 1/13: EF 55%, Gr 2 diast dysfn, Asc Ao aneurysm 4.8 cm, mild MR, mod LAE, mild RAE  . Hypothyroidism   . ICH (intracerebral hemorrhage) ([place]) 03/24/15  . NASH (nonalcoholic steatohepatitis)   . Neuromuscular disorder ([place])   . Pacemaker-Medtronic 11/12/2011   Implanted 2013   . Persistent atrial fibrillation ([place])   . PMR (polymyalgia rheumatica) (HCC)    no steriods for 5 years   . sinus node dysfunction//post termination pauses   . Stroke ([place])   . Urinary tract infection    Recurrent infections.     Past Surgical History:  Procedure Laterality Date  . CARDIAC CATHETERIZATION    . CARDIOVERSION  03/02/2012   Procedure: CARDIOVERSION;  Surgeon: Lelon Perla, MD;  Location: [facility] ENDOSCOPY;  Service: Cardiovascular;  Laterality: N/A;  . CARDIOVERSION  03/13/2012   Procedure: CARDIOVERSION;  Surgeon: Josue Hector, MD;  Location: [place];  Service: Cardiovascular;  Laterality: N/A;  . CARDIOVERSION  04/15/2012   Procedure: CARDIOVERSION;  Surgeon: [name], MD;  Location: [place];  Service: Cardiovascular;  Laterality: N/A;  . CARDIOVERSION N/A 06/07/2014   Procedure: CARDIOVERSION;  Surgeon: [name], MD;  Location: [facility] ENDOSCOPY;  Service: Cardiovascular;  Laterality: N/A;  . CARDIOVERSION N/A 08/18/2014   Procedure: CARDIOVERSION;  Surgeon: [name], MD;  Location: [place];  Service: Cardiovascular;  Laterality: N/A;  . CARDIOVERSION N/A 09/06/2015   Procedure: CARDIOVERSION;  Surgeon: Jerline Pain, MD;  Location: [place];  Service: Cardiovascular;  Laterality: N/A;  . CYSTOSCOPY WITH STENT PLACEMENT Right 04/22/2016   Procedure: CYSTOSCOPY WITH RIGHT URETERAL STENT INSERTION;  Surgeon: [name], MD;  Location: [place];  Service: Urology;  Laterality: Right;  . CYSTOSCOPY WITH URETEROSCOPY, STONE BASKETRY  AND STENT PLACEMENT Right 04/30/2016   Procedure: CYSTOSCOPY WITH URETEROSCOPY, STENT PLACEMENT REMOVAL;  Surgeon: [name], MD;  Location: [facility];  Service: Urology;  Laterality: Right;  . HERNIA REPAIR    . INCISION AND DRAINAGE BREAST ABSCESS  1973  . PACEMAKER INSERTION  Jan 2013  . PERMANENT PACEMAKER INSERTION N/A 07/29/2011   Procedure: PERMANENT PACEMAKER INSERTION;  Surgeon: [name], MD;  Location: [facility] CATH LAB;  Service: Cardiovascular;  Laterality: N/A;  . UMBILICAL HERNIA REPAIR  1950's    Current  Outpatient Prescriptions  Medication Sig Dispense Refill  . apixaban (ELIQUIS) 5 MG TABS tablet Take 1 tablet (5 mg total) by mouth 2 (two) times daily. 180 tablet 3  . Calcium Carbonate-Vitamin D (CALTRATE 600+D PO) Take 1 tablet by mouth daily.     . cetirizine (ZYRTEC) 10 MG tablet Take 10 mg by mouth daily as needed for allergies.     . Cholecalciferol (VITAMIN D3) 1000 UNITS tablet Take 1,000 Units by mouth at bedtime.     Marland Kitchen diltiazem (CARDIZEM CD) 240 MG 24 hr capsule Take 1 capsule (240 mg total) by mouth daily. 90 capsule 3  . furosemide (LASIX) 20 MG tablet TAKE ONE (1) TABLET BY MOUTH EVERY DAY 30 tablet 8  . labetalol (NORMODYNE) 300 MG tablet TAKE ONE BY MOUTH TWO TIMES A DAY. 60 tablet 8  . levothyroxine (SYNTHROID, LEVOTHROID) 25 MCG tablet Take 25 mcg by mouth daily before breakfast.    . multivitamin (THERAGRAN) per tablet Take 1 tablet by mouth at bedtime.     . multivitamin-lutein (OCUVITE-LUTEIN) CAPS capsule Take 1 capsule by mouth daily.    . potassium chloride (K-DUR) 10 MEQ tablet Take 10 mEq by mouth 2 (two) times daily.    . traMADol (ULTRAM) 50 MG tablet Take 1 tablet (50 mg total) by mouth every 6 (six) hours as needed for moderate pain. 10 tablet 0  . valsartan (DIOVAN) 160 MG tablet Take 1 tablet (160 mg total) by mouth daily. 90 tablet 3  . WELCHOL 625 MG tablet Take 625 mg by mouth every morning and 1250 mg by mouth at bedtime.     No current facility-administered medications for this visit.     Allergies  Allergen Reactions  . Penicillins Anaphylaxis    Has patient had a PCN reaction causing immediate rash, facial/tongue/throat swelling, SOB or lightheadedness with hypotension: Yes Has patient had a PCN reaction causing severe rash involving mucus membranes or skin necrosis: No Has patient had a PCN reaction that required hospitalization No Has patient had a PCN reaction occurring within the last 10 years: No If all of the above answers are "NO", then may  proceed with Cephalosporin use.   . Statins Other (See Comments)    REACTION: elevated LFT's  . Tylenol [Acetaminophen] Other (See Comments)    If taken with Vytorin at risk for liver damage  . Tape Other (See Comments)    Redness, Please use "paper" tape only.  . Codeine Other (See Comments)    feel funny, head is fuzzy    Review of Systems negative except from HPI and PMH  Physical Exam BP 130/66   Pulse 88   Ht 5' 6"  (1.676 m)   Wt 164 lb 6.4 oz (74.6 kg)   SpO2 98%   BMI 26.53 kg/m  Well developed and nourished in no acute distress HENT normal Neck supple  flat  Clear Irregularly irregular rate and rhythm, no murmurs or gallops Abd-soft  with active BS No Clubbing cyanosis no edema.; nonpitting and involving primarily the forefoot Skin-warm and dry A & Oriented  Grossly normal sensory and motor function    ECG demonstrates Vpacing 84 Intervals-/16/47   Assessment and  Plan   Atrial fibrillaton permanent   Thoracic aortic aneurysm  Hypertension reasonably controlled   Pacemaker The patient's device was interrogated.  The information was reviewed. No changes were made in the programming.     Iatrogenic hyperthyroidism-- now improved      On Anticoagulation;  No bleeding issues   tolerating afib without symptoms  Better now that she ie euthyroid

## 2016-09-25 ENCOUNTER — Other Ambulatory Visit: Payer: Self-pay | Admitting: Internal Medicine

## 2016-11-05 ENCOUNTER — Other Ambulatory Visit: Payer: Self-pay | Admitting: Internal Medicine

## 2016-12-24 ENCOUNTER — Ambulatory Visit (INDEPENDENT_AMBULATORY_CARE_PROVIDER_SITE_OTHER): Payer: Medicare Other | Admitting: *Deleted

## 2016-12-24 ENCOUNTER — Telehealth: Payer: Self-pay | Admitting: Cardiology

## 2016-12-24 DIAGNOSIS — I495 Sick sinus syndrome: Secondary | ICD-10-CM

## 2016-12-24 NOTE — Telephone Encounter (Signed)
Spoke with pt and reminded pt of remote transmission that is due today. Pt verbalized understanding.   

## 2016-12-25 ENCOUNTER — Encounter: Payer: Self-pay | Admitting: Cardiology

## 2016-12-25 NOTE — Progress Notes (Signed)
Remote pacemaker transmission.   

## 2016-12-26 LAB — CUP PACEART REMOTE DEVICE CHECK
Battery Impedance: 382 Ohm
Battery Remaining Longevity: 111 mo
Brady Statistic RV Percent Paced: 51 %
Implantable Lead Implant Date: 20130114
Implantable Lead Implant Date: 20130114
Implantable Lead Location: 753859
Implantable Lead Model: 1948
Implantable Lead Model: 5076
Lead Channel Impedance Value: 67 Ohm
Lead Channel Impedance Value: 707 Ohm
Lead Channel Pacing Threshold Amplitude: 0.625 V
Lead Channel Setting Pacing Pulse Width: 0.4 ms
Lead Channel Setting Sensing Sensitivity: 5.6 mV
MDC IDC LEAD LOCATION: 753860
MDC IDC MSMT BATTERY VOLTAGE: 2.79 V
MDC IDC MSMT LEADCHNL RV PACING THRESHOLD PULSEWIDTH: 0.4 ms
MDC IDC PG IMPLANT DT: 20130114
MDC IDC SESS DTM: 20180612142037
MDC IDC SET LEADCHNL RV PACING AMPLITUDE: 2.5 V

## 2017-01-01 ENCOUNTER — Telehealth: Payer: Self-pay | Admitting: Neurology

## 2017-01-01 NOTE — Telephone Encounter (Signed)
Pt was seen at [facility] 6/17-discharged on 6/18 for stoke like symptoms. She went back yesterday but was told they could not do anything else. She was advised to f/u with PCP and neurologist. Pt has appt with Hoyle Sauer on Monday 6/25

## 2017-01-01 NOTE — Telephone Encounter (Signed)
Notes in care everywhere.  (12-29-16 TIA and  12-31-16 TIA,  visits to [facility] ED).  Has f/u with pcp 01-02-17.  Has seen Dr. Erlinda Hong 09-17-16, to f/u prn.

## 2017-01-02 NOTE — Telephone Encounter (Signed)
Since the pt has been seen at [facility] for new TIA recently, per CM/NP to see Dr. Erlinda Hong.  Pt has appt 01-06-17 with CM but will reschedule to come in to see Dr. Erlinda Hong on this day.  Will try to exchange appt with one of his other pts to accomodate if ok with Dr. Erlinda Hong or add to his schedule.

## 2017-01-02 NOTE — Telephone Encounter (Signed)
Sounds ok with me. Thanks.   Rosalin Hawking, MD PhD Stroke Neurology 01/02/2017 3:14 PM

## 2017-01-03 NOTE — Telephone Encounter (Signed)
Done

## 2017-01-06 ENCOUNTER — Ambulatory Visit (INDEPENDENT_AMBULATORY_CARE_PROVIDER_SITE_OTHER): Payer: Medicare Other | Admitting: Neurology

## 2017-01-06 ENCOUNTER — Ambulatory Visit: Payer: Medicare Other | Admitting: Nurse Practitioner

## 2017-01-06 ENCOUNTER — Encounter: Payer: Self-pay | Admitting: Neurology

## 2017-01-06 VITALS — BP 115/71 | HR 76 | Ht 66.0 in | Wt 165.2 lb

## 2017-01-06 DIAGNOSIS — G43109 Migraine with aura, not intractable, without status migrainosus: Secondary | ICD-10-CM | POA: Diagnosis not present

## 2017-01-06 DIAGNOSIS — I481 Persistent atrial fibrillation: Secondary | ICD-10-CM | POA: Diagnosis not present

## 2017-01-06 DIAGNOSIS — Z950 Presence of cardiac pacemaker: Secondary | ICD-10-CM

## 2017-01-06 DIAGNOSIS — Z7901 Long term (current) use of anticoagulants: Secondary | ICD-10-CM | POA: Diagnosis not present

## 2017-01-06 DIAGNOSIS — I4819 Other persistent atrial fibrillation: Secondary | ICD-10-CM

## 2017-01-06 NOTE — Progress Notes (Signed)
STROKE NEUROLOGY FOLLOW UP NOTE  NAME: [name] [name] DOB: 16-May-1931  REASON FOR VISIT: stroke follow up HISTORY FROM: pt and chart  Today we had the pleasure of seeing [name] [name] in follow-up at our [facility]. Pt was accompanied by husband.   History Summary [name] is a 83 y.o. Female with hx of a-fib on coumadin s/p pacemaker, HTN, CAD, HLD admitted on 03/25/15 for L parietal ICH. INR 2.35, reversed with Kcentra. INR down to 1.13. Symptoms resolved and repeat CT stable. Her BP was low while in hospital and her BP meds has to be hold initially and put back gradually as BP getting up. Still holding off hydralazine on discharge. Plan to repeat CT in one month and restart AC. Pt is discharged in good condition.  Follow up 05/02/15 - the patient has been doing well. However, she went to ER on 04/01/15 for elevated BP and got CT head showed evolving ICH. She went to ER again on 04/06/15 for frontal HA, got CT head again for further evolving ICH. She follows with Dr. Caryl Comes in clinic for afib, on amiodarone and nadolol. Currently BP 120-130 at home. Today BP 114/68. She has thoracic aorta aneurysm, in 07/2011 was 4.8cm and in 03/2015 it showed 4.4 cm.   Follow up 09/05/15 - pt doing well from stroke standpoint. No recurrent symptoms. No bleeding side effects on eliquis. She still has intermittent afib symptomatic. This time again since Thursday. Will have appointment with cardiology to do cardioversion tomorrow. BP today in clinic 95/62. However pt checks her BP at home always 120-130s.  03/04/16 follow up - pt has been doing well from stroke standpoint. She follows with cardiology and stopped amiodarone due to hyperthyroidism, on prednisone and methimazole. Currently still in afib but not symptomatic. She also had repeat CTA chest in 10/2015 and AAA is stable at 4.9cm. BP 123/72.   09/17/16 follow up - pt has been doing well from stroke standpoint. She was treated for  hyperthyroidism due to amiodarone use and then became hypothyroidism and now on levothyroxine. She also followed with [facility] and AAA stable at 4.9 cm and recommend to repeat CTA in one year. BP good at home, today 137/77.  Interval History During the interval time, pt had episode on 12/29/16 sitting outside hot in [place] with sudden onset of left perioral numbness and paresthesia, left sided facial weakness, left 2nd and 3rd finger numbness, and slurry speech. Symptoms lasted about 15-30 min and resolved en route to ED. CT head no acute change, no tPA given. She was admitted and given one dose of ASA and then eliquis resumed. She was discharged second day. 12/31/16 she was in house had again left two fingers numbness and left mouth slight numbness, lasting a few minutes and resolved on EMS arrival and went to ED repeat CT negative. D/c home. The same day at home, she had another two similar episodes lasting less than 2 min. On 01/03/17 she experienced again similar episodes, lasting a few minutes.   She denies LOC, CP, vision changes, heart palpitations. She compliant with eliquis. She denies HA, but she has for the last month some intermittent perioral funny feelings, once she wipes her mouth, the feeling is gone. She had some HA about 25 years ago, with photophobia, phonophobia, and one time had to go to dark room. Those HAs lasting 2-3 hours. She can not remember details. She recently started to eat broccoli every day which is the  only dietary changes. BP today 115/71. On other compliance.    REVIEW OF SYSTEMS: Full 14 system review of systems performed and notable only for those listed below and in HPI above, all others are negative:  Constitutional:   Cardiovascular:  Ear/Nose/Throat:   Skin:  Eyes:   Respiratory:   Gastroitestinal:   Genitourinary:  Hematology/Lymphatic:   Endocrine:  Musculoskeletal:   Allergy/Immunology:   Neurological: numbness Psychiatric:  Sleep:   The following  represents the patient's updated allergies and side effects list: Allergies  Allergen Reactions  . Penicillins Anaphylaxis    Has patient had a PCN reaction causing immediate rash, facial/tongue/throat swelling, SOB or lightheadedness with hypotension: Yes Has patient had a PCN reaction causing severe rash involving mucus membranes or skin necrosis: No Has patient had a PCN reaction that required hospitalization No Has patient had a PCN reaction occurring within the last 10 years: No If all of the above answers are "NO", then may proceed with Cephalosporin use.   . Statins Other (See Comments)    REACTION: elevated LFT's  . Tylenol [Acetaminophen] Other (See Comments)    If taken with Vytorin at risk for liver damage  . Amiodarone Other (See Comments)  . Tape Other (See Comments)    Redness, Please use "paper" tape only.  . Codeine Other (See Comments)    feel funny, head is fuzzy    The neurologically relevant items on the patient's problem list were reviewed on today's visit.  Neurologic Examination  A problem focused neurological exam (12 or more points of the single system neurologic examination, vital signs counts as 1 point, cranial nerves count for 8 points) was performed.  Blood pressure 115/71, pulse 76, height 5' 6"  (1.676 m), weight 165 lb 3.2 oz (74.9 kg).  General - Well nourished, well developed, in no apparent distress.  Ophthalmologic - Sharp disc margins OU.   Cardiovascular - irregularly irregular heart rate and rhythm.  Mental Status -  Level of arousal and orientation to time, place, and person were intact. Language including expression, naming, repetition, comprehension was assessed and found intact. Fund of Knowledge was assessed and was intact.  Cranial Nerves II - XII - II - Visual field intact OU. III, IV, VI - Extraocular movements intact. V - Facial sensation intact bilaterally. VII - Facial movement intact bilaterally. VIII - Hearing &  vestibular intact bilaterally. X - Palate elevates symmetrically. XI - Chin turning & shoulder shrug intact bilaterally. XII - Tongue protrusion intact.  Motor Strength - The patient's strength was normal in all extremities and pronator drift was absent.  Bulk was normal and fasciculations were absent.   Motor Tone - Muscle tone was assessed at the neck and appendages and was normal.  Reflexes - The patient's reflexes were 1+ in all extremities and she had no pathological reflexes.  Sensory - Light touch, temperature/pinprick were assessed and were normal.    Coordination - The patient had normal movements in the hands and feet with no ataxia or dysmetria.  Tremor was absent.  Gait and Station - mildly slow and cautious gait, but steady.  Data reviewed: I personally reviewed the images and agree with the radiology interpretations.  Ct Head Wo Contrast  03/26/2015 IMPRESSION: Slight interval decrease in size of left occipital intraparenchymal hemorrhage. No new acute intracranial finding.   03/26/2015 IMPRESSION: Stable LEFT parietal parasagittal intraparenchymal hematoma as described. No significant increase in hematoma or edema compared with yesterday's CT.   03/25/2015 IMPRESSION: Acute left  posterior parietal intraparenchymal hemorrhage is noted.   04/01/15 - Slight increase in the size of left posterior parietal hematoma with development of 6 mm satellite hemorrhage as well, new from most recent prior study.  04/06/15 - Continued presence of intraparenchymal hemorrhage in left posterior parietal cortex with stable amount of surrounding white matter edema. Stable small satellite hemorrhage is noted more superiorly toward the vertex. No significant midline shift is noted.  2D echo - - Compared to a prior echo in 2013, there are few changes. Pacemaker wires are now noted. There is no longer a pericadrial effusion. The ascending aorta is dilated to 4.4 cm at the  most distally visualized point. Consider dedicated imaging of the aorta wtih CT. There is moderate TR with RVSP of 42 mmHg, the RA pressure is estimated at 8 mmHg.  CT head 12/2016 in [place] x 2 - no acute abnormalities   Component     Latest Ref Rng 03/27/2015  Cholesterol     0 - 200 mg/dL 181  Triglycerides     <150 mg/dL 96  HDL Cholesterol     >40 mg/dL 64  Total CHOL/HDL Ratio      2.8  VLDL     0 - 40 mg/dL 19  LDL (calc)     0 - 99 mg/dL 98  Hemoglobin A1C     4.8 - 5.6 % 5.5  Mean Plasma Glucose      111   Component     Latest Ref Rng & Units 07/27/2015 01/25/2016 02/26/2016 04/24/2016  TSH     0.350 - 4.500 uIU/mL 2.066 <0.010 (L) 0.11 (L) 13.943 (H)    Assessment: As you may recall, she is a 83 y.o. Caucasian female with PMH of a-fib on coumadin s/p pacemaker, HTN, CAD, HLD admitted on 03/25/15 for L parietal ICH. INR 2.35, reversed with Kcentra. INR down to 1.13. Symptoms resolved and repeat CT stable. AC on hold. Pt is discharged in good condition. During the interval time, she had two head CT showing evolving hematoma. BP well controlled with meds. No neuro deficit residue. She follows with Dr. Caryl Comes in clinic for afib, on amiodarone and nadolol. She has thoracic aorta aneurysm, in 07/2011 was 4.8cm and in 10/2014 and 10/2015 and 06/2016 it is stable at 4.9 cm. Restarted on eliquis 37m bid and tolerating well. Still has paroxysmal afib and followed with cardiology. Amiodarone stopped due to hyperthyroidism, on prednisone taper and methimazole, then became hypothyroidism and now on levothyroxine. BP stable.  From 12/29/16 to 01/03/17, pt had 5 episodes of left perioral and left two fingers numbness, lasting from 2 min to 30 min[facility] Continued eliquis. However, due to stereotype symptoms, unlikely to be cardioembolic stroke. Symptoms more concerning for migraine equivalent, will try tylenol as soon as symptom starts. Seizure low  suspicious this time.  Plan:  - your symptoms more concerning for migraine equivalent or aura without headache. Recommend to take tylenol as soon as symptoms starts to abort the episode. If you have totally different symptoms, you still need to call 911 for further revaluation.  - continue eliquis [id number] a day for stroke prevention - bleeding precaution - follow up with cardiology for afib management - follow up with [facility] for AAA monitoring  - check BP at home and record - Follow up with your primary care physician for stroke risk factor modification. Recommend maintain blood pressure goal <130/80, diabetes with hemoglobin A1c goal  below 6.5% and lipids with LDL cholesterol goal below 70 mg/dL.  - follow up in 3 months.  I spent more than 25 minutes of face to face time with the patient. Greater than 50% of time was spent in counseling and coordination of care. We discussed etiology of recurrent episodes, ddx, treatment options and continue eliquis.   No orders of the defined types were placed in this encounter.   No orders of the defined types were placed in this encounter.   Patient Instructions  - your symptoms more concerning for migraine equivalent or aura without headache. Recommend to take tylenol as soon as symptoms starts to abort the episode. If you have totally different symptoms, you still need to call 911 for further revaluation.  - continue eliquis 51m twice a day for stroke prevention - bleeding precaution - follow up with cardiology for afib management - follow up with [facility] for AAA monitoring  - check BP at home and record - Follow up with your primary care physician for stroke risk factor modification. Recommend maintain blood pressure goal <130/80, diabetes with hemoglobin A1c goal below 6.5% and lipids with LDL cholesterol goal below 70 mg/dL.  - follow up in 3 months.   JRosalin Hawking MD PhD [facility] [address] [place]  [phone]

## 2017-01-06 NOTE — Patient Instructions (Addendum)
-   your symptoms more concerning for migraine equivalent or aura without headache. Recommend to take tylenol as soon as symptoms starts to abort the episode. If you have totally different symptoms, you still need to call 911 for further revaluation.  - continue eliquis 7m twice a day for stroke prevention - bleeding precaution - follow up with cardiology for afib management - follow up with [facility] for AAA monitoring  - check BP at home and record - Follow up with your primary care physician for stroke risk factor modification. Recommend maintain blood pressure goal <130/80, diabetes with hemoglobin A1c goal below 6.5% and lipids with LDL cholesterol goal below 70 mg/dL.  - follow up in 3 months.

## 2017-01-07 ENCOUNTER — Telehealth: Payer: Self-pay | Admitting: Internal Medicine

## 2017-01-07 NOTE — Telephone Encounter (Signed)
I left a message on Sharon's identified voice mail of the date of the patient's last echo and her EF on that report.

## 2017-01-07 NOTE — Telephone Encounter (Signed)
°  New Prob   Calling to confirm last 2D Echo, when it was, EF. Please call.

## 2017-01-22 ENCOUNTER — Other Ambulatory Visit: Payer: Self-pay | Admitting: Internal Medicine

## 2017-01-22 DIAGNOSIS — Z1231 Encounter for screening mammogram for malignant neoplasm of breast: Secondary | ICD-10-CM

## 2017-01-30 ENCOUNTER — Telehealth: Payer: Self-pay | Admitting: Internal Medicine

## 2017-01-30 MED ORDER — IRBESARTAN 150 MG PO TABS: 150.0000 mg | ORAL_TABLET | Freq: Every day | ORAL | 6 refills | Status: DC

## 2017-01-30 NOTE — Telephone Encounter (Signed)
I called and spoke with the patient- she states she had a cortisone shot about 2 weeks ago. She has seen some flucuation of her blood pressure- SBP 120-140's. She was concerned that the cortisone shot may be causing this. I advised her that I would suspect the chances of this 2 weeks out is minimal. However, I have advised her to continue to monitor and if she sees that she SBP is 140 or above on a consistent basis, she should call back and let us know.  Also, due to valsartan recall, she is aware that she should finish out her current supply and that we will switch her over to Irbestartan 150 mg once daily per Dr. Caryl Comes, when she is due for her next refill- will go ahead and send to the pharmacy and ask them to put on hold. She is agreeable and voices understanding.

## 2017-01-30 NOTE — Telephone Encounter (Signed)
Patient calling, would like to discuss BP concerns and will not be available after 2pm. Thanks,

## 2017-03-03 ENCOUNTER — Ambulatory Visit: Payer: Medicare Other

## 2017-03-10 ENCOUNTER — Ambulatory Visit
Admission: RE | Admit: 2017-03-10 | Discharge: 2017-03-10 | Disposition: A | Discharge status: Home / Self Care | Payer: Medicare Other | Source: Ambulatory Visit | Attending: Internal Medicine | Admitting: Internal Medicine

## 2017-03-10 DIAGNOSIS — Z1231 Encounter for screening mammogram for malignant neoplasm of breast: Secondary | ICD-10-CM

## 2017-03-25 ENCOUNTER — Ambulatory Visit (INDEPENDENT_AMBULATORY_CARE_PROVIDER_SITE_OTHER): Payer: Medicare Other | Admitting: *Deleted

## 2017-03-25 ENCOUNTER — Telehealth: Payer: Self-pay | Admitting: Cardiology

## 2017-03-25 DIAGNOSIS — I495 Sick sinus syndrome: Secondary | ICD-10-CM | POA: Diagnosis not present

## 2017-03-25 NOTE — Telephone Encounter (Signed)
LMOVM reminding pt to send remote transmission.   

## 2017-03-25 NOTE — Progress Notes (Signed)
Remote pacemaker transmission.   

## 2017-03-26 ENCOUNTER — Encounter: Payer: Self-pay | Admitting: Cardiology

## 2017-03-26 LAB — CUP PACEART REMOTE DEVICE CHECK
Date Time Interrogation Session: 20180911182146
Implantable Lead Implant Date: 20130114
Implantable Lead Location: 753859
Implantable Pulse Generator Implant Date: 20130114
Lead Channel Impedance Value: 67 Ohm
Lead Channel Impedance Value: 720 Ohm
Lead Channel Pacing Threshold Amplitude: 0.625 V
Lead Channel Pacing Threshold Pulse Width: 0.4 ms
Lead Channel Setting Pacing Amplitude: 2.5 V
MDC IDC LEAD IMPLANT DT: 20130114
MDC IDC LEAD LOCATION: 753860
MDC IDC MSMT BATTERY IMPEDANCE: 407 Ohm
MDC IDC MSMT BATTERY REMAINING LONGEVITY: 109 mo
MDC IDC MSMT BATTERY VOLTAGE: 2.78 V
MDC IDC SET LEADCHNL RV PACING PULSEWIDTH: 0.4 ms
MDC IDC SET LEADCHNL RV SENSING SENSITIVITY: 5.6 mV
MDC IDC STAT BRADY RV PERCENT PACED: 48 %

## 2017-03-31 ENCOUNTER — Other Ambulatory Visit: Payer: Self-pay | Admitting: Internal Medicine

## 2017-04-14 ENCOUNTER — Ambulatory Visit (INDEPENDENT_AMBULATORY_CARE_PROVIDER_SITE_OTHER): Payer: Medicare Other | Admitting: Neurology

## 2017-04-14 ENCOUNTER — Encounter: Payer: Self-pay | Admitting: Neurology

## 2017-04-14 VITALS — BP 150/82 | HR 83 | Ht 66.0 in | Wt 166.2 lb

## 2017-04-14 DIAGNOSIS — Z7901 Long term (current) use of anticoagulants: Secondary | ICD-10-CM | POA: Diagnosis not present

## 2017-04-14 DIAGNOSIS — G43109 Migraine with aura, not intractable, without status migrainosus: Secondary | ICD-10-CM | POA: Diagnosis not present

## 2017-04-14 DIAGNOSIS — Z950 Presence of cardiac pacemaker: Secondary | ICD-10-CM

## 2017-04-14 DIAGNOSIS — I611 Nontraumatic intracerebral hemorrhage in hemisphere, cortical: Secondary | ICD-10-CM

## 2017-04-14 DIAGNOSIS — I481 Persistent atrial fibrillation: Secondary | ICD-10-CM

## 2017-04-14 DIAGNOSIS — I4819 Other persistent atrial fibrillation: Secondary | ICD-10-CM

## 2017-04-14 NOTE — Progress Notes (Signed)
STROKE NEUROLOGY FOLLOW UP NOTE  NAME: [name] [name] DOB: 04-18-1931  REASON FOR VISIT: stroke follow up HISTORY FROM: pt and chart  Today we had the pleasure of seeing [name] [name] in follow-up at our [facility]. Pt was accompanied by husband.   History Summary [name] [name] is a 83 y.o. Female with hx of a-fib on coumadin s/p pacemaker, HTN, CAD, HLD admitted on 03/25/15 for L parietal ICH. INR 2.35, reversed with Kcentra. INR down to 1.13. Symptoms resolved and repeat CT stable. Her BP was low while in hospital and her BP meds has to be hold initially and put back gradually as BP getting up. Still holding off hydralazine on discharge. Plan to repeat CT in one month and restart AC. Pt is discharged in good condition.  Follow up 05/02/15 - the patient has been doing well. However, she went to ER on 04/01/15 for elevated BP and got CT head showed evolving ICH. She went to ER again on 04/06/15 for frontal HA, got CT head again for further evolving ICH. She follows with Dr. Caryl Comes in clinic for afib, on amiodarone and nadolol. Currently BP 120-130 at home. Today BP 114/68. She has thoracic aorta aneurysm, in 07/2011 was 4.8cm and in 03/2015 it showed 4.4 cm.   Follow up 09/05/15 - pt doing well from stroke standpoint. No recurrent symptoms. No bleeding side effects on eliquis. She still has intermittent afib symptomatic. This time again since Thursday. Will have appointment with cardiology to do cardioversion tomorrow. BP today in clinic 95/62. However pt checks her BP at home always 120-130s.  03/04/16 follow up - pt has been doing well from stroke standpoint. She follows with cardiology and stopped amiodarone due to hyperthyroidism, on prednisone and methimazole. Currently still in afib but not symptomatic. She also had repeat CTA chest in 10/2015 and AAA is stable at 4.9cm. BP 123/72.   09/17/16 follow up - pt has been doing well from stroke standpoint. She was treated for  hyperthyroidism due to amiodarone use and then became hypothyroidism and now on levothyroxine. She also followed with [facility] and AAA stable at 4.9 cm and recommend to repeat CTA in one year. BP good at home, today 137/77.  01/06/17 follow up - pt had episode on 12/29/16 sitting outside hot in [place] with sudden onset of left perioral numbness and paresthesia, left sided facial weakness, left 2nd and 3rd finger numbness, and slurry speech. Symptoms lasted about 15-30 min and resolved en route to ED. CT head no acute change, no tPA given. She was admitted and given one dose of ASA and then eliquis resumed. She was discharged second day. 12/31/16 she was in house had again left two fingers numbness and left mouth slight numbness, lasting a few minutes and resolved on EMS arrival and went to ED repeat CT negative. D/c home. The same day at home, she had another two similar episodes lasting less than 2 min. On 01/03/17 she experienced again similar episodes, lasting a few minutes. She denies LOC, CP, vision changes, heart palpitations. She compliant with eliquis. She denies HA, but she has for the last month some intermittent perioral funny feelings, once she wipes her mouth, the feeling is gone. She had some HA about 25 years ago, with photophobia, phonophobia, and one time had to go to dark room. Those HAs lasting 2-3 hours. She can not remember details. She recently started to eat broccoli every day which is the only dietary changes. BP  today 115/71. no other complains.   Interval History During the interval time, pt has been doing well. No more episodes like above. BP today 150/82, however, patient said his blood pressure at home always 120/60. She will see endocrinologist today for her thyroid follow-up. She also complains of long-standing bilateral hand tremor, right > left with hand writing, however mild, not bothering her.   REVIEW OF SYSTEMS: Full 14 system review of systems performed and notable only for  those listed below and in HPI above, all others are negative:  Constitutional:   Cardiovascular:  Ear/Nose/Throat:   Skin:  Eyes:   Respiratory:   Gastroitestinal:   Genitourinary:  Hematology/Lymphatic:   Endocrine:  Musculoskeletal:   Allergy/Immunology:   Neurological:  Psychiatric:  Sleep:   The following represents the patient's updated allergies and side effects list: Allergies  Allergen Reactions  . Penicillins Anaphylaxis    Has patient had a PCN reaction causing immediate rash, facial/tongue/throat swelling, SOB or lightheadedness with hypotension: Yes Has patient had a PCN reaction causing severe rash involving mucus membranes or skin necrosis: No Has patient had a PCN reaction that required hospitalization No Has patient had a PCN reaction occurring within the last 10 years: No If all of the above answers are "NO", then may proceed with Cephalosporin use.   . Statins Other (See Comments)    REACTION: elevated LFT's  . Tylenol [Acetaminophen] Other (See Comments)    If taken with Vytorin at risk for liver damage  . Amiodarone Other (See Comments)  . Tape Other (See Comments)    Redness, Please use "paper" tape only.  . Codeine Other (See Comments)    feel funny, head is fuzzy    The neurologically relevant items on the patient's problem list were reviewed on today's visit.  Neurologic Examination  A problem focused neurological exam (12 or more points of the single system neurologic examination, vital signs counts as 1 point, cranial nerves count for 8 points) was performed.  Blood pressure (!) 150/82, pulse 83, height 5' 6"  (1.676 m), weight 166 lb 3.2 oz (75.4 kg).  General - Well nourished, well developed, in no apparent distress.  Ophthalmologic - Sharp disc margins OU.   Cardiovascular - irregularly irregular heart rate and rhythm.  Mental Status -  Level of arousal and orientation to time, place, and person were intact. Language including  expression, naming, repetition, comprehension was assessed and found intact. Fund of Knowledge was assessed and was intact.  Cranial Nerves II - XII - II - Visual field intact OU. III, IV, VI - Extraocular movements intact. V - Facial sensation intact bilaterally. VII - Facial movement intact bilaterally. VIII - Hearing & vestibular intact bilaterally. X - Palate elevates symmetrically. XI - Chin turning & shoulder shrug intact bilaterally. XII - Tongue protrusion intact.  Motor Strength - The patient's strength was normal in all extremities and pronator drift was absent.  Bulk was normal and fasciculations were absent.   Motor Tone - Muscle tone was assessed at the neck and appendages and was normal.  Reflexes - The patient's reflexes were 1+ in all extremities and she had no pathological reflexes.  Sensory - Light touch, temperature/pinprick were assessed and were normal.    Coordination - The patient had normal movements in the hands and feet with no ataxia or dysmetria.  Tremor was absent.  Gait and Station - mildly slow and cautious gait with broad-base, but steady.  Data reviewed: I personally reviewed the images and  agree with the radiology interpretations.  Ct Head Wo Contrast  03/26/2015 IMPRESSION: Slight interval decrease in size of left occipital intraparenchymal hemorrhage. No new acute intracranial finding.   03/26/2015 IMPRESSION: Stable LEFT parietal parasagittal intraparenchymal hematoma as described. No significant increase in hematoma or edema compared with yesterday's CT.   03/25/2015 IMPRESSION: Acute left posterior parietal intraparenchymal hemorrhage is noted.   04/01/15 - Slight increase in the size of left posterior parietal hematoma with development of 6 mm satellite hemorrhage as well, new from most recent prior study.  04/06/15 - Continued presence of intraparenchymal hemorrhage in left posterior parietal cortex with stable amount of  surrounding white matter edema. Stable small satellite hemorrhage is noted more superiorly toward the vertex. No significant midline shift is noted.  2D echo - - Compared to a prior echo in 2013, there are few changes. Pacemaker wires are now noted. There is no longer a pericadrial effusion. The ascending aorta is dilated to 4.4 cm at the most distally visualized point. Consider dedicated imaging of the aorta wtih CT. There is moderate TR with RVSP of 42 mmHg, the RA pressure is estimated at 8 mmHg.  CT head 12/2016 in [place] x 2 - no acute abnormalities   Component     Latest Ref Rng 03/27/2015  Cholesterol     0 - 200 mg/dL 181  Triglycerides     <150 mg/dL 96  HDL Cholesterol     >40 mg/dL 64  Total CHOL/HDL Ratio      2.8  VLDL     0 - 40 mg/dL 19  LDL (calc)     0 - 99 mg/dL 98  Hemoglobin A1C     4.8 - 5.6 % 5.5  Mean Plasma Glucose      111   Component     Latest Ref Rng & Units 07/27/2015 01/25/2016 02/26/2016 04/24/2016  TSH     0.350 - 4.500 uIU/mL 2.066 <0.010 (L) 0.11 (L) 13.943 (H)    Assessment: As you may recall, she is a 83 y.o. Caucasian female with PMH of a-fib on coumadin s/p pacemaker, HTN, CAD, HLD admitted on 03/25/15 for L parietal ICH. INR 2.35, reversed with Kcentra. INR down to 1.13. Symptoms resolved and repeat CT stable. AC on hold. Pt is discharged in good condition. During the interval time, she had two head CT showing evolving hematoma. BP well controlled with meds. No neuro deficit residue. She follows with Dr. Caryl Comes in clinic for afib, on amiodarone and nadolol. She has thoracic aorta aneurysm, in 07/2011 was 4.8cm and in 10/2014 and 10/2015 and 06/2016 it is stable at 4.9 cm. Restarted on eliquis 19m bid and tolerating well. Still has paroxysmal afib and followed with cardiology. Amiodarone stopped due to hyperthyroidism, on prednisone taper and methimazole, then became hypothyroidism and now on levothyroxine. Following with endocrinology. BP  stable.  From 12/29/16 to 01/03/17, pt had 5 episodes of left perioral and left two fingers numbness, lasting from 2 min to 30 min. [facility] Continued eliquis. However, due to stereotype symptoms, unlikely to be cardioembolic stroke. Symptoms more concerning for migraine equivalent, will try tylenol as soon as symptom starts. Seizure low suspicious this time. During interval time, no more such episodes. Complains of bilateral hand essential tremor, mild, no need medication.  Plan:  - continue eliquis 558mtwice a day for stroke prevention - follow up with cardiology for afib management - follow up with [facility] for AAA  monitoring  - follow up with endocrinology for tyroid function - you likely have essential tremor but mild, no need medication this time. - check BP at home and record - Follow up with your primary care physician for stroke risk factor modification. Recommend maintain blood pressure goal <130/80, diabetes with hemoglobin A1c goal below 6.5% and lipids with LDL cholesterol goal below 70 mg/dL.  - follow up as needed.   I spent more than 25 minutes of face to face time with the patient. Greater than 50% of time was spent in counseling and coordination of care.   No orders of the defined types were placed in this encounter.   No orders of the defined types were placed in this encounter.   Patient Instructions  - continue eliquis 30m twice a day for stroke prevention - bleeding precaution - follow up with cardiology for afib management - follow up with [facility] for AAA monitoring  - follow up with endocrinology for tyroid function - you likely have essential tremor but mild, no need medication this time. - check BP at home and record - Follow up with your primary care physician for stroke risk factor modification. Recommend maintain blood pressure goal <130/80, diabetes with hemoglobin A1c goal below 6.5% and lipids with LDL cholesterol goal  below 70 mg/dL.  - follow up as needed.    JRosalin Hawking MD PhD [facility] [address] [place] [phone]

## 2017-04-14 NOTE — Patient Instructions (Addendum)
-   continue eliquis 53m twice a day for stroke prevention - bleeding precaution - follow up with cardiology for afib management - follow up with [facility] for AAA monitoring  - follow up with endocrinology for tyroid function - you likely have essential tremor but mild, no need medication this time. - check BP at home and record - Follow up with your primary care physician for stroke risk factor modification. Recommend maintain blood pressure goal <130/80, diabetes with hemoglobin A1c goal below 6.5% and lipids with LDL cholesterol goal below 70 mg/dL.  - follow up as needed.

## 2017-05-08 ENCOUNTER — Telehealth: Payer: Self-pay | Admitting: Internal Medicine

## 2017-05-08 NOTE — Telephone Encounter (Signed)
Spoke with patient who mistakenly  took this morning's pills last night at 9:30.  She was advised to skip her am pills this morning and resume her pm pills this evening.  She showed understanding of this conversation.

## 2017-05-08 NOTE — Telephone Encounter (Signed)
New Message  Pt call requesting to speak with RN. Pt states she took her pm pill dosage this am. Pt would like to further instructions. Please call back to discuss

## 2017-05-27 ENCOUNTER — Other Ambulatory Visit: Payer: Self-pay

## 2017-05-27 DIAGNOSIS — I480 Paroxysmal atrial fibrillation: Secondary | ICD-10-CM

## 2017-05-27 MED ORDER — APIXABAN 5 MG PO TABS: 5.0000 mg | ORAL_TABLET | Freq: Two times a day (BID) | ORAL | 3 refills | Status: DC

## 2017-06-09 ENCOUNTER — Other Ambulatory Visit: Payer: Self-pay | Admitting: Internal Medicine

## 2017-06-24 ENCOUNTER — Ambulatory Visit (INDEPENDENT_AMBULATORY_CARE_PROVIDER_SITE_OTHER): Payer: Medicare Other | Admitting: *Deleted

## 2017-06-24 DIAGNOSIS — I495 Sick sinus syndrome: Secondary | ICD-10-CM

## 2017-06-27 ENCOUNTER — Encounter: Payer: Self-pay | Admitting: Cardiology

## 2017-06-27 NOTE — Progress Notes (Signed)
Letter  

## 2017-06-27 NOTE — Progress Notes (Signed)
Remote pacemaker transmission.   

## 2017-07-09 LAB — CUP PACEART REMOTE DEVICE CHECK
Battery Impedance: 457 Ohm
Battery Remaining Longevity: 104 mo
Battery Voltage: 2.78 V
Date Time Interrogation Session: 20181212223216
Implantable Lead Implant Date: 20130114
Implantable Lead Location: 753859
Implantable Lead Location: 753860
Implantable Lead Model: 5076
Lead Channel Impedance Value: 67 Ohm
Lead Channel Setting Sensing Sensitivity: 5.6 mV
MDC IDC LEAD IMPLANT DT: 20130114
MDC IDC MSMT LEADCHNL RV IMPEDANCE VALUE: 686 Ohm
MDC IDC MSMT LEADCHNL RV PACING THRESHOLD AMPLITUDE: 0.5 V
MDC IDC MSMT LEADCHNL RV PACING THRESHOLD PULSEWIDTH: 0.4 ms
MDC IDC PG IMPLANT DT: 20130114
MDC IDC SET LEADCHNL RV PACING AMPLITUDE: 2.5 V
MDC IDC SET LEADCHNL RV PACING PULSEWIDTH: 0.4 ms
MDC IDC STAT BRADY RV PERCENT PACED: 47 %

## 2017-07-10 ENCOUNTER — Other Ambulatory Visit: Payer: Self-pay

## 2017-07-10 DIAGNOSIS — I712 Thoracic aortic aneurysm, without rupture, unspecified: Secondary | ICD-10-CM

## 2017-07-10 DIAGNOSIS — I7789 Other specified disorders of arteries and arterioles: Secondary | ICD-10-CM

## 2017-07-18 ENCOUNTER — Other Ambulatory Visit: Payer: Self-pay | Admitting: Cardiothoracic Surgery

## 2017-07-18 DIAGNOSIS — I712 Thoracic aortic aneurysm, without rupture, unspecified: Secondary | ICD-10-CM

## 2017-07-22 ENCOUNTER — Other Ambulatory Visit: Payer: Medicare Other

## 2017-07-22 ENCOUNTER — Ambulatory Visit: Payer: Medicare Other | Admitting: Cardiothoracic Surgery

## 2017-07-22 ENCOUNTER — Ambulatory Visit
Admission: RE | Admit: 2017-07-22 | Discharge: 2017-07-22 | Disposition: A | Discharge status: Home / Self Care | Payer: Medicare Other | Source: Ambulatory Visit | Attending: Cardiothoracic Surgery | Admitting: Cardiothoracic Surgery

## 2017-07-22 DIAGNOSIS — I712 Thoracic aortic aneurysm, without rupture, unspecified: Secondary | ICD-10-CM

## 2017-07-22 MED ORDER — IOPAMIDOL (ISOVUE-370) INJECTION 76%
75.0000 mL | Freq: Once | INTRAVENOUS | Status: AC | PRN
  Administered 2017-07-22: 75 mL via INTRAVENOUS

## 2017-07-24 ENCOUNTER — Other Ambulatory Visit: Payer: Self-pay

## 2017-07-24 ENCOUNTER — Ambulatory Visit: Payer: Medicare Other | Admitting: Cardiothoracic Surgery

## 2017-07-24 ENCOUNTER — Encounter: Payer: Self-pay | Admitting: Cardiothoracic Surgery

## 2017-07-24 VITALS — BP 134/80 | HR 87 | Resp 18 | Ht 66.0 in | Wt 167.8 lb

## 2017-07-24 DIAGNOSIS — I712 Thoracic aortic aneurysm, without rupture, unspecified: Secondary | ICD-10-CM

## 2017-07-24 NOTE — Progress Notes (Signed)
[address]       [place] [address]             [id number]        Hazell W Landrigan [place] Medical Record #[id number] Date of Birth: 19-Aug-1930  Referring: [name], MD Primary Care: [name], MD Primary Cardiology: Virl Axe, MD  Chief Complaint:    Chief Complaint  Patient presents with  . Follow-up    f/u thoracic aortic aneurysm one yr with CT 07/22/2017    History of Present Illness:      Patient is a 84  -year-old female with a history of atrial fib on Coumadin In the past .  July 29 2011 a pacemaker was placed. A followup an echocardiogram was performed that demonstrated dilated descending aorta leading to  evaluation with a CT scan of the chest .  She has no history of aortic valve disease.  Patient has been followed's in the thoracic surgery office since March 2012  She has no family history of aneurysm disease or sudden death, her grandfather did die in his 40s of a cerebral aneurysm.   Patient denies any shortness of breath or chest discomfort at rest, some mild sob with exertion , no angina.  She does note mild edema in her ankles  She does note over the past year she had "stroke" described as a cerebral bleed , with residual tremor of her hands   Since I first started seeing the patient she is become increasingly frail.  She is able to get around with daily activities.  She notes she was recently treated for urinary tract infection but does not think Cipro was used to treat it.  Current Activity/ Functional Status: Patient is independent with mobility/ambulation, transfers, ADL's, IADL's.   Zubrod Score: At the time of surgery this patient's most appropriate activity status/level should be described as: []     0    Normal activity, no symptoms []     1    Restricted in physical strenuous activity but ambulatory, able to do out light work [x]     2    Ambulatory and capable of self care, unable to do work activities, up and  about >50 % of waking hours                              []     3    Only limited self care, in bed greater than 50% of waking hours []     4    Completely disabled, no self care, confined to bed or chair []     5    Moribund   Past Medical History:  Diagnosis Date  . Ascending aorta enlargement (HCC)    4.8 cm per echo 08/07/11; 5.0 by CT in Jan 2013  . CAD (coronary artery disease)    LHC 5/08:  pOM 20%, pRCA 20-30%, EF 60%  . Hemorrhoids    current problem as of 04/26/16  with slight bleeding of hemorrhoids   . Hepatitis    hx of medication induced hepatitis (Vytorin per pt)  . History of loop recorder   . HLD (hyperlipidemia)   . HTN (hypertension)   . Hx of echocardiogram    echo 1/13: EF 55%, Gr 2 diast dysfn, Asc Ao aneurysm 4.8 cm, mild MR, mod LAE, mild RAE  . Hypothyroidism   . ICH (intracerebral hemorrhage) ([place]) 03/24/15  .  NASH (nonalcoholic steatohepatitis)   . Neuromuscular disorder ([place])   . Pacemaker-Medtronic 11/12/2011   Implanted 2013   . Persistent atrial fibrillation ([place])   . PMR (polymyalgia rheumatica) (HCC)    no steriods for 5 years  . sinus node dysfunction//post termination pauses   . Stroke ([place])   . Urinary tract infection    Recurrent infections.     Past Surgical History:  Procedure Laterality Date  . CARDIAC CATHETERIZATION    . CARDIOVERSION  03/02/2012   Procedure: CARDIOVERSION;  Surgeon: Lelon Perla, MD;  Location: [facility] ENDOSCOPY;  Service: Cardiovascular;  Laterality: N/A;  . CARDIOVERSION  03/13/2012   Procedure: CARDIOVERSION;  Surgeon: Josue Hector, MD;  Location: [place];  Service: Cardiovascular;  Laterality: N/A;  . CARDIOVERSION  04/15/2012   Procedure: CARDIOVERSION;  Surgeon: [name], MD;  Location: [place];  Service: Cardiovascular;  Laterality: N/A;  . CARDIOVERSION N/A 06/07/2014   Procedure: CARDIOVERSION;  Surgeon: [name], MD;  Location: [facility] ENDOSCOPY;  Service: Cardiovascular;  Laterality: N/A;    . CARDIOVERSION N/A 08/18/2014   Procedure: CARDIOVERSION;  Surgeon: [name], MD;  Location: [place];  Service: Cardiovascular;  Laterality: N/A;  . CARDIOVERSION N/A 09/06/2015   Procedure: CARDIOVERSION;  Surgeon: Jerline Pain, MD;  Location: [place];  Service: Cardiovascular;  Laterality: N/A;  . CYSTOSCOPY WITH STENT PLACEMENT Right 04/22/2016   Procedure: CYSTOSCOPY WITH RIGHT URETERAL STENT INSERTION;  Surgeon: [name], MD;  Location: [place];  Service: Urology;  Laterality: Right;  . CYSTOSCOPY WITH URETEROSCOPY, STONE BASKETRY AND STENT PLACEMENT Right 04/30/2016   Procedure: CYSTOSCOPY WITH URETEROSCOPY, STENT PLACEMENT REMOVAL;  Surgeon: [name], MD;  Location: [facility];  Service: Urology;  Laterality: Right;  . HERNIA REPAIR    . INCISION AND DRAINAGE BREAST ABSCESS  1973  . PACEMAKER INSERTION  Jan 2013  . PERMANENT PACEMAKER INSERTION N/A 07/29/2011   Procedure: PERMANENT PACEMAKER INSERTION;  Surgeon: [name], MD;  Location: [facility] CATH LAB;  Service: Cardiovascular;  Laterality: N/A;  . UMBILICAL HERNIA REPAIR  1950's    Family History  Problem Relation Age of Onset  . Coronary artery disease Mother   . Coronary artery disease Unknown   . Alzheimer's disease Unknown   . Coronary artery disease Brother   . Coronary artery disease Brother       Social History   Tobacco Use  Smoking Status Former Smoker  . Types: Cigarettes  . Last attempt to quit: 07/16/1990  . Years since quitting: 27.0  Smokeless Tobacco Never Used    Social History   Substance and Sexual Activity  Alcohol Use No     Allergies  Allergen Reactions  . Penicillins Anaphylaxis    Has patient had a PCN reaction causing immediate rash, facial/tongue/throat swelling, SOB or lightheadedness with hypotension: Yes Has patient had a PCN reaction causing severe rash involving mucus membranes or skin necrosis: No Has patient had a PCN reaction that required hospitalization  No Has patient had a PCN reaction occurring within the last 10 years: No If all of the above answers are "NO", then may proceed with Cephalosporin use.   . Statins Other (See Comments)    REACTION: elevated LFT's  . Tylenol [Acetaminophen] Other (See Comments)    If taken with Vytorin at risk for liver damage  . Amiodarone Other (See Comments)  . Tape Other (See Comments)    Redness, Please use "paper" tape only.  . Codeine Other (See  Comments)    feel funny, head is fuzzy    Current Outpatient Medications  Medication Sig Dispense Refill  . apixaban (ELIQUIS) 5 MG TABS tablet Take 1 tablet (5 mg total) 2 (two) times daily by mouth. 180 tablet 3  . Calcium Carbonate-Vitamin D (CALTRATE 600+D PO) Take 1 tablet by mouth daily.     . cetirizine (ZYRTEC) 10 MG tablet Take 10 mg by mouth daily as needed for allergies.     . Cholecalciferol (VITAMIN D3) 1000 UNITS tablet Take 1,000 Units by mouth at bedtime.     Marland Kitchen diltiazem (CARDIZEM CD) 240 MG 24 hr capsule TAKE 1 CAPSULE BY MOUTH DAILY 90 capsule 1  . furosemide (LASIX) 20 MG tablet TAKE 1 TABLET BY MOUTH DAILY 30 tablet 2  . irbesartan (AVAPRO) 150 MG tablet Take 1 tablet (150 mg total) by mouth daily. 30 tablet 6  . labetalol (NORMODYNE) 300 MG tablet TAKE 1 TABLET BY MOUTH TWICE DAILY 60 tablet 11  . levothyroxine (SYNTHROID, LEVOTHROID) 25 MCG tablet Take 25 mcg by mouth daily before breakfast.    . multivitamin (THERAGRAN) per tablet Take 1 tablet by mouth at bedtime.     . multivitamin-lutein (OCUVITE-LUTEIN) CAPS capsule Take 1 capsule by mouth daily.    . potassium chloride (K-DUR) 10 MEQ tablet Take 10 mEq by mouth daily.     Earnestine Mealing 625 MG tablet Take 625 mg by mouth every morning and 1250 mg by mouth at bedtime.     No current facility-administered medications for this visit.        Review of Systems:  Review of Systems  Constitutional: Negative for chills, diaphoresis, fever, malaise/fatigue and weight loss.  HENT:  Positive for hearing loss. Negative for congestion, nosebleeds, sinus pain and sore throat.   Eyes: Negative.   Respiratory: Positive for shortness of breath. Negative for cough, hemoptysis, sputum production, wheezing and stridor.   Cardiovascular: Positive for palpitations and leg swelling. Negative for chest pain, orthopnea, claudication and PND.  Gastrointestinal: Negative for abdominal pain, blood in stool, constipation, diarrhea, heartburn, melena, nausea and vomiting.  Genitourinary: Positive for frequency. Negative for dysuria, hematuria and urgency.  Musculoskeletal: Positive for back pain and myalgias.  Skin: Negative.   Neurological: Positive for weakness. Negative for dizziness, tingling, tremors and headaches.  Endo/Heme/Allergies: Negative for environmental allergies and polydipsia. Bruises/bleeds easily.  Psychiatric/Behavioral: Negative.        Immunizations: Flu Blue.Reese  ]; Pneumococcal[y  ];  Physical Exam: BP 134/80 (BP Location: Left Arm, Patient Position: Sitting, Cuff Size: Normal)   Pulse 87   Resp 18   Ht 5' 6"  (1.676 m)   Wt 167 lb 12.8 oz (76.1 kg)   SpO2 98% Comment: RA  BMI 27.08 kg/m  General appearance: alert and cooperative Head: atraumatic Neck: no adenopathy, no carotid bruit, no JVD, supple, symmetrical, trachea midline and thyroid not enlarged, symmetric, no tenderness/mass/nodules Lymph nodes: Cervical, supraclavicular, and axillary nodes normal. Resp: clear to auscultation bilaterally Back: symmetric, no curvature. ROM normal. No CVA tenderness. Cardio: irregularly irregular rhythm GI: soft, non-tender; bowel sounds normal; no masses,  no organomegaly Extremities: edema Mild edema both ankles and Homans sign is negative, no sign of DVT Neurologic: Grossly normal   Diagnostic Studies & Laboratory data:     Recent Radiology Findings:   Ct Angio Chest Aorta W/cm &/or Wo/cm  Result Date: 07/22/2017 CLINICAL DATA:  Follow-up thoracic aortic aneurysm  EXAM: CT ANGIOGRAPHY CHEST WITH CONTRAST TECHNIQUE: Multidetector CT imaging of  the chest was performed using the standard protocol during bolus administration of intravenous contrast. Multiplanar CT image reconstructions and MIPs were obtained to evaluate the vascular anatomy. CONTRAST:  28m ISOVUE-370 IOPAMIDOL (ISOVUE-370) INJECTION 76% COMPARISON:  07/04/2016 FINDINGS: Cardiovascular: Thoracic aortic aneurysm again noted, maximally measuring 4.7 cm in the proximal ascending thoracic aorta. Aortic arch mildly aneurysmal at 3.3 cm distally. These compared to 4.9 and 3.5 cm previously. Distal descending thoracic aorta near the hiatus measures 3.8 cm compared with 3.7 cm previously. Diffuse aortic atherosclerosis. Heart mildly enlarged. Pacer wires noted in the right heart. Mediastinum/Nodes: No mediastinal, hilar, or axillary adenopathy. Trachea and esophagus are unremarkable. Lungs/Pleura: Mild emphysema. Scarring in the lower lung fields. No confluent opacities or effusions. No suspicious pulmonary nodules. Upper Abdomen: Imaging into the upper abdomen shows no acute findings. Aorta at the hiatus measures 2.8 cm. Musculoskeletal: Chest wall soft tissues are unremarkable. No acute bony abnormality. Review of the MIP images confirms the above findings. IMPRESSION: Thoracic aortic aneurysm, largest in the proximal ascending thoracic aorta at 4.7 cm, also noted within the aortic arch and distal descending thoracic aorta. No significant change since prior study. Recommend semi-annual imaging followup by CTA or MRA and referral to cardiothoracic surgery if not already obtained. This recommendation follows 2010 ACCF/AHA/AATS/ACR/ASA/SCA/SCAI/SIR/STS/SVM Guidelines for the Diagnosis and Management of Patients With Thoracic Aortic Disease. Circulation. 2010; 121:: G644-I347Aortic Atherosclerosis (ICD10-I70.0) and Emphysema (ICD10-J43.9). Electronically Signed   By: KRolm BaptiseM.D.   On: 07/22/2017 11:35   I have  independently reviewed the above radiology studies  and reviewed the findings with the patient.  Ct Angio Chest Aorta W/cm &/or Wo/cm  Result Date: 07/04/2016 CLINICAL DATA:  FU TAA WO RUPTURE X 3 YRS NO PERTINENT SX NO HX CA DM HX PACEMAKER HTN PRIORS IN PACS EXAM: CT ANGIOGRAPHY CHEST WITH CONTRAST TECHNIQUE: Multidetector CT imaging of the chest was performed using the standard protocol during bolus administration of intravenous contrast. Multiplanar CT image reconstructions and MIPs were obtained to evaluate the vascular anatomy. CONTRAST:  75 mL Isovue-300 IV GFR = 41, USE 60 CC CONTRAST PER DR WAGNER COMPARISON:  11/01/2015 FINDINGS: Cardiovascular: Left subclavian Transvenous pacing leads extend into the right atrium and towards the right ventricular apex. Dilated central pulmonary arteries. Incomplete opacification of pulmonary artery branches ; the exam was not optimized for detection of pulmonary emboli. Patent bilateral pulmonary veins drain into the left atrium. Left atrial enlargement. Scattered coronary calcifications. Thoracic aortic aneurysm with measurements as follows: 3.6 cm sinuses of Valsalva 3.4 cm sino-tubular junction 4.9 cm proximal ascending (stable since previous exam) 4 cm distal ascending/proximal arch 3 cm distal arch 3.5 cm proximal descending 3.7 cm saccular aneurysm distal descending at the level of the right lateral penetrating atheromatous ulcer, previously 3.6 cm 2.7 cm distal descending above the diaphragm. There is classic 3 vessel brachiocephalic arterial origin anatomy without proximal stenosis. Moderate calcified plaque throughout the thoracic aorta. Mediastinum/Nodes: No pericardial effusion. No mediastinal hematoma. subcentimeter mediastinal lymph nodes. No hilar adenopathy. Lungs/Pleura: No pleural effusion. No pneumothorax. Linear scarring posteriorly at the right lung base. Upper Abdomen: No acute abnormality. Stable 15 mm exophytic probable cyst from the upper pole  right kidney. Musculoskeletal: Anterior vertebral endplate spurring at multiple levels in the mid and lower thoracic spine. Review of the MIP images confirms the above findings. IMPRESSION: 1. Stable 4.9 cm ascending aortic aneurysm without complicating features. 2. Continued enlargement of eccentric penetrating atheromatous ulcer with small saccular aneurysm in the distal descending  thoracic aorta. Electronically Signed   By: Lucrezia Europe M.D.   On: 07/04/2016 16:56   Ct Angio Chest Aorta W/cm &/or Wo/cm  11/01/2015  CLINICAL DATA:  Followup thoracic aortic aneurysm EXAM: CT ANGIOGRAPHY CHEST WITH CONTRAST TECHNIQUE: Multidetector CT imaging of the chest was performed using the standard protocol during bolus administration of intravenous contrast. Multiplanar CT image reconstructions and MIPs were obtained to evaluate the vascular anatomy. Creatinine was obtained on site at [place] at [address] Results: Creatinine 1.0 mg/dL. CONTRAST:  75 mL Isovue 370. COMPARISON:  10/20/2014 FINDINGS: Vascular: The thoracic aorta is again well visualized. There is dilatation of the ascending aorta stable at 4.9 cm. Some mild motion artifact is noted. It measures approximately 33 mm at the sino-tubular junction an approximately 36 mm at the level of the sinus of Valsalva. Stable diameter of 3.1 cm in the distal aortic arch is noted. Some tapering is seen distal similar to that noted on the prior exam. Calcific plaque is seen without evidence of dissection. Although not timed for pulmonary embolism evaluation no large central embolus is noted within the pulmonary artery. The brachiocephalic vessels appear within normal limits with mild atherosclerotic changes. The visualized portions of the upper abdominal visceral vessels appear patent. Nonvascular: Some minimal scarring is noted in the right lower lobe stable from the prior exam. No focal infiltrate or sizable effusion is seen. No sizable parenchymal nodule  is seen. Mild foci pleural thickening are seen posteriorly particularly on the left. No sizable hilar or mediastinal adenopathy is noted. The visualized portions of the upper abdomen are within normal limits. A small exophytic cyst is again noted from the upper pole of the right kidney. No acute bony abnormality is seen. Review of the MIP images confirms the above findings. IMPRESSION: Stable appearing aneurysmal dilatation of the thoracic aorta as described. Ascending thoracic aortic aneurysm. Recommend semi-annual imaging followup by CTA or MRA and referral to cardiothoracic surgery if not already obtained. This recommendation follows 2010 ACCF/AHA/AATS/ACR/ASA/SCA/SCAI/SIR/STS/SVM Guidelines for the Diagnosis and Management of Patients With Thoracic Aortic Disease. Circulation. 2010; 121: [id number] No new focal abnormality is seen. Electronically Signed   By: Inez Catalina M.D.   On: 11/01/2015 15:02   Ct Angio Chest Aorta W/cm &/or Wo/cm  11/04/2013   CLINICAL DATA:  Ascending aortic enlargement  EXAM: CT ANGIOGRAPHY CHEST WITH CONTRAST  TECHNIQUE: Multidetector CT imaging of the chest was performed using the standard protocol during bolus administration of intravenous contrast. Multiplanar CT image reconstructions and MIPs were obtained to evaluate the vascular anatomy.  CONTRAST:  55m OMNIPAQUE IOHEXOL 350 MG/ML SOLN  COMPARISON:  09/23/2012  FINDINGS: Stable small hypodense thyroid lesions. Dual lead pacer noted. At the level of the pulmonary artery on image 57 of series 4, the ascending aortic transverse caliber is 4.9 cm, no change from 09/23/2012. Central arch 3.2 cm on image 28 of series 4 (stable) and proximal descending thoracic aorta 3.1 cm (likewise stable) if scattered atherosclerotic calcification involving the ascending aorta, aortic arch, descending thoracic aorta, and branch vasculature noted. Mild cardiomegaly is again noted. No pathologic thoracic adenopathy or pericardial effusion.  There  is a slightly greater amount of right eccentric mural thrombus in the descending thoracic aorta adjacent to the aortic hiatus on image 89 of series 4. The celiac trunk and nodes prompt proximal branches appear patent. An exophytic right kidney upper pole lesion is likely a cyst, measuring 13 Hounsfield units.  Stable mild right lower lobe scarring.  Thoracic kyphosis and thoracic spondylosis noted.  Review of the MIP images confirms the above findings.  IMPRESSION: 1. No change in size or appearance of ascending aortic aneurysm (which measures up to 4.9 cm) or descending thoracic aortic ectasia. 2. Thoracic kyphosis and spondylosis.   Electronically Signed   By: Sherryl Barters M.D.   On: 11/04/2013 09:10   Ct Angio Chest W/cm &/or Wo Cm  09/23/2012  *RADIOLOGY REPORT*  Clinical Data: Follow-up ascending aortic aneurysm.  CT ANGIOGRAPHY CHEST  Technique:  Multidetector CT imaging of the chest using the standard protocol during bolus administration of intravenous contrast. Multiplanar reconstructed images including MIPs were obtained and reviewed to evaluate the vascular anatomy.  Contrast: 61m OMNIPAQUE IOHEXOL 350 MG/ML SOLN  Comparison: 01/28/2012.  Findings: Low attenuation lesions in the thyroid measure up to 1.4 cm on the right, as before.  Ascending aorta measures up to 4.9 cm, unchanged from the prior examination.  Transverse aorta measures up to 3.2 cm and proximal descending thoracic aorta, 3.1 cm, also unchanged.  There is atherosclerotic irregularity and calcification of the aorta with calcification seen in the coronary arteries. Heart is mildly enlarged.  No pericardial effusion.  No pathologically enlarged mediastinal, hilar or axillary lymph nodes.  Scattered scarring in the right lower lobe and along the left major fissure.  No pleural fluid.  Airway is unremarkable.  Incidental imaging of the upper abdomen shows no acute findings. No worrisome lytic or sclerotic lesions.  IMPRESSION:  1.  Stable  ascending aortic aneurysm. Transverse and descending thoracic aortic diameters are stable as well. 2.  Coronary artery calcification.   Original Report Authenticated By: MLorin Picket M .     Followup CT scan of the chest done July 2013 Ct Angio Chest Aorta W/cm &/or Wo/cm  10/20/2014   CLINICAL DATA:  Thoracic aortic aneurysm.  EXAM: CT ANGIOGRAPHY CHEST WITH CONTRAST  TECHNIQUE: Multidetector CT imaging of the chest was performed using the standard protocol during bolus administration of intravenous contrast. Multiplanar CT image reconstructions and MIPs were obtained to evaluate the vascular anatomy.  CONTRAST:  75 mL of Isovue 370 intravenously.  COMPARISON:  CT scan of November 04, 2013.  FINDINGS: No pneumothorax or pleural effusion is noted. No acute pulmonary disease is noted. Ascending thoracic aorta has maximum measured diameter of 4.9 cm which is not significantly changed compared to prior exam. Transverse aortic arch measures 3.1 cm which is not significantly changed compared to prior exam. Proximal portion of descending thoracic aorta measures 3.1 cm which is not significantly changed compared to prior exam. No dissection is noted. Atherosclerotic calcifications are again noted. No mediastinal mass or adenopathy is noted. Left-sided pacemaker is unchanged in position. Pulmonary arteries appear grossly normal. Stable mild cardiomegaly is noted. Stable exophytic cyst is seen arising from upper pole of right kidney.  Review of the MIP images confirms the above findings.  IMPRESSION: Stable ascending thoracic aortic aneurysm with maximum measured diameter 4.9 cm. No significant changes noted compared to prior exam.   Electronically Signed   By: JMarijo Conception M.D.   On: 10/20/2014 13:17    Ct Angio Chest W/cm &/or Wo Cm  01/28/2012  *RADIOLOGY REPORT*  Clinical Data: Follow-up aortic aneurysm.  CT ANGIOGRAPHY CHEST  Technique:  Multidetector CT imaging of the chest using the standard protocol  during bolus administration of intravenous contrast. Multiplanar reconstructed images including MIPs were obtained and reviewed to evaluate the vascular anatomy.  Contrast: 1045mOMNIPAQUE IOHEXOL 300 MG/ML  SOLN  Comparison: 08/13/2011  Findings: Again noted is the mild aneurysmal dilatation of the ascending aorta, currently measuring maximally 4.7 cm compared 5.0 cm previously.  No dissection or significant change.  Proximal descending thoracic aorta measures maximally 3.1 cm.  Descending thoracic aorta at the aortic hiatus measures 2.8 cm.  Distal thoracic aortic calcifications noted.  Left-sided pacer remains in place, unchanged.  Coronary artery calcifications present. No mediastinal, hilar, or axillary adenopathy.  Linear scarring in the lung bases.  Lungs otherwise clear.  No pleural effusions.  No suspicious pulmonary nodules or masses.  Visualized chest wall soft tissues are unremarkable.  Imaging into the upper abdomen demonstrates no acute findings.  No acute bony abnormality.  Mild degenerative changes and kyphosis in the thoracic spine.  IMPRESSION: Stable ascending aortic aneurysm, measuring maximally 4.7 cm.  Coronary artery disease.  Original Report Authenticated By: Raelyn Number, M.D.    Recent Lab Findings: Lab Results  Component Value Date   WBC 7.7 04/25/2016   HGB 12.2 04/30/2016   HCT 36.0 04/30/2016   PLT 215 04/25/2016   GLUCOSE 83 06/20/2016   CHOL 181 03/27/2015   TRIG 96 03/27/2015   HDL 64 03/27/2015   LDLCALC 98 03/27/2015   ALT 59 (H) 04/25/2016   AST 30 04/25/2016   NA 141 06/20/2016   K 4.0 06/20/2016   CL 108 06/20/2016   CREATININE 1.23 (H) 06/20/2016   BUN 10 06/20/2016   CO2 25 06/20/2016   TSH 13.943 (H) 04/24/2016   INR 1.07 04/30/2016   HGBA1C 5.5 03/27/2015   Aortic Size Index=     4.7    /Body surface area is 1.88 meters squared. =2.54  < 2.75 cm/m2      4% risk per year 2.75 to 4.25          8% risk per year > 4.25 cm/m2    20% risk per  year  Cross-sectional area to height ratio 10.3   Assessment / Plan:  1/  ascending aortic aneurysm (which measures up to 4.7cm) stable since 2012     2/descending thoracic aortic ectasia 3/Thoracic kyphosis and spondylosis 4/ history of afib on coumadin  I have reviewed the patient'sCT scan, she does have a dilated ascending aorta approximately 4. 7 cm maximum diameter without evidence of aortic valve disease. There has been no change over a since 2012.  With her age of 72 years increasing fragilitysize less then 5.5 cm and index 2.47 in size not increasing over time I would not recommend elective repair of her ascending aorta . I've discussed with her the risks of dissection and consequences of that versus the risk of elective repair. She understands a good blood pressure control including beta blockers important.  We discussed avoiding strenuous lifting and avoiding use of fluoroscopy quinolones. we'll plan to see her back in approximately 12 months with a followup CTA of the aorta to evaluate any change in size. With her advancing age and fragility  history of stroke and anticoagulation with a nonreversible agent I also discussed with her the place for surgical intervention and replacement of her aorta in an emergency situation. She will discuss this with family.  Patient was warned about not using Cipro and similar antibiotics. Recent studies have raised concern that fluoroquinolone antibiotics could be associated with an increased risk of aortic aneurysm Fluoroquinolones have non-antimicrobial properties that might jeopardise the integrity of the extracellular matrix of the vascular wall In a  propensity score matched cohort  study in [place], there was a 66% increased rate of aortic aneurysm or dissection associated with oral fluoroquinolone use, compared with amoxicillin use, within a 60 day risk period from start of treatment   Grace Isaac MD  Beeper [phone] Office  [phone] 07/24/2017 9:40 AM

## 2017-07-24 NOTE — Patient Instructions (Signed)

## 2017-08-25 ENCOUNTER — Other Ambulatory Visit: Payer: Self-pay | Admitting: Internal Medicine

## 2017-09-08 ENCOUNTER — Other Ambulatory Visit: Payer: Self-pay | Admitting: Internal Medicine

## 2017-09-17 ENCOUNTER — Encounter: Payer: Self-pay | Admitting: Internal Medicine

## 2017-09-24 ENCOUNTER — Other Ambulatory Visit: Payer: Self-pay | Admitting: Internal Medicine

## 2017-09-25 ENCOUNTER — Ambulatory Visit: Payer: Medicare Other | Admitting: Internal Medicine

## 2017-09-25 ENCOUNTER — Encounter: Payer: Medicare Other | Admitting: *Deleted

## 2017-09-25 VITALS — BP 142/84 | HR 81 | Ht 66.0 in | Wt 170.0 lb

## 2017-09-25 DIAGNOSIS — I495 Sick sinus syndrome: Secondary | ICD-10-CM

## 2017-09-25 DIAGNOSIS — Z79899 Other long term (current) drug therapy: Secondary | ICD-10-CM | POA: Diagnosis not present

## 2017-09-25 DIAGNOSIS — I481 Persistent atrial fibrillation: Secondary | ICD-10-CM | POA: Diagnosis not present

## 2017-09-25 DIAGNOSIS — Z950 Presence of cardiac pacemaker: Secondary | ICD-10-CM

## 2017-09-25 DIAGNOSIS — I4819 Other persistent atrial fibrillation: Secondary | ICD-10-CM

## 2017-09-25 MED ORDER — POTASSIUM CHLORIDE ER 10 MEQ PO TBCR: 10.0000 meq | EXTENDED_RELEASE_TABLET | Freq: Every day | ORAL | 3 refills | Status: DC

## 2017-09-25 NOTE — Patient Instructions (Addendum)
Medication Instructions:  Your physician has recommended you make the following change in your medication:   1. Begin taking 24m, 2 tablets, of Lasix for the next 3 days 2. After that, resume your normal dose of 236m one tablet every day.  Labwork: You will have labs drawn today: BMP and CBC and TSH  Testing/Procedures: None ordered.  Follow-Up: Your physician recommends that you schedule a follow-up appointment in:  One Year with AmChanetta MarshallNP or ReTommye StandardPA  Remote monitoring is used to monitor your Pacemaker of ICD from home. This monitoring reduces the number of office visits required to check your device to one time per year. It allows [employer] keep an eye on the functioning of your device to ensure it is working properly. You are scheduled for a device check from home on 12/25/2017. You may send your transmission at any time that day. If you have a wireless device, the transmission will be sent automatically. After your physician reviews your transmission, you will receive a postcard with your next transmission date.   Any Other Special Instructions Will Be Listed Below (If Applicable).     If you need a refill on your cardiac medications before your next appointment, please call your pharmacy.

## 2017-09-25 NOTE — Progress Notes (Signed)
Patient Care Team: [name], MD as PCP - General (Internal Medicine) [name], MD as PCP - Cardiology (Cardiology) [name], MD (Cardiology)   HPI  [name] [name] is a 84 y.o. female  Seen in followup for atrial fibrillation in the setting of modest nonobstructive coronary disease and normal left ventricular function. She is status post pacemaker implantation for sinus node dysfunction and posttermination pauses. She's been treated with flecainide and subsequently with Rythmol and most recently amiodarone. This is been complicated by hyperthyroidism. It has been discontinued. Notably she is asymptomatic and her atrial fibrillation at this time.   1/17 TSH 2.01  7/17 TSH <0.01 8/17 TSH 0.11 10/17 TSH 13.9   She is now on synthroid and feeling much  better   She takes apixaban;  She has hx of L parietal ICH  Seen recently by neuro and felt to be stable     Saw Dr. Rayann Heman to consider catheter ablation- chose to continue amio  Now At Fib permanent    She has problems with chronic lower extremity edema although it is less now than it has been in the past.  She is somewhat more unstable on her feet.  She denies chest pain or shortness of breath.  She also has a thoracic aortic aneurysm followed by Dr. Everrett Coombe last note identifies increasing fragility elimination of the possibility of elective replacement of ascending aorta   Past Medical History:  Diagnosis Date  . Ascending aorta enlargement (HCC)    4.8 cm per echo 08/07/11; 5.0 by CT in Jan 2013  . CAD (coronary artery disease)    LHC 5/08:  pOM 20%, pRCA 20-30%, EF 60%  . Hemorrhoids    current problem as of 04/26/16  with slight bleeding of hemorrhoids   . Hepatitis    hx of medication induced hepatitis (Vytorin per pt)  . History of loop recorder   . HLD (hyperlipidemia)   . HTN (hypertension)   . Hx of echocardiogram    echo 1/13: EF 55%, Gr 2 diast dysfn, Asc Ao aneurysm 4.8 cm, mild MR, mod  LAE, mild RAE  . Hypothyroidism   . ICH (intracerebral hemorrhage) ([place]) 03/24/15  . NASH (nonalcoholic steatohepatitis)   . Neuromuscular disorder ([place])   . Pacemaker-Medtronic 11/12/2011   Implanted 2013   . Persistent atrial fibrillation ([place])   . PMR (polymyalgia rheumatica) (HCC)    no steriods for 5 years  . sinus node dysfunction//post termination pauses   . Stroke ([place])   . Urinary tract infection    Recurrent infections.     Past Surgical History:  Procedure Laterality Date  . CARDIAC CATHETERIZATION    . CARDIOVERSION  03/02/2012   Procedure: CARDIOVERSION;  Surgeon: Lelon Perla, MD;  Location: [facility] ENDOSCOPY;  Service: Cardiovascular;  Laterality: N/A;  . CARDIOVERSION  03/13/2012   Procedure: CARDIOVERSION;  Surgeon: Josue Hector, MD;  Location: [facility] ENDOSCOPY;  Service: Cardiovascular;  Laterality: N/A;  . CARDIOVERSION  04/15/2012   Procedure: CARDIOVERSION;  Surgeon: [name], MD;  Location: [facility] ENDOSCOPY;  Service: Cardiovascular;  Laterality: N/A;  . CARDIOVERSION N/A 06/07/2014   Procedure: CARDIOVERSION;  Surgeon: [name], MD;  Location: [facility] ENDOSCOPY;  Service: Cardiovascular;  Laterality: N/A;  . CARDIOVERSION N/A 08/18/2014   Procedure: CARDIOVERSION;  Surgeon: [name], MD;  Location: [place];  Service: Cardiovascular;  Laterality: N/A;  . CARDIOVERSION N/A 09/06/2015   Procedure: CARDIOVERSION;  Surgeon: Thana Farr  Marlou Porch, MD;  Location: [place];  Service: Cardiovascular;  Laterality: N/A;  . CYSTOSCOPY WITH STENT PLACEMENT Right 04/22/2016   Procedure: CYSTOSCOPY WITH RIGHT URETERAL STENT INSERTION;  Surgeon: [name], MD;  Location: [place];  Service: Urology;  Laterality: Right;  . CYSTOSCOPY WITH URETEROSCOPY, STONE BASKETRY AND STENT PLACEMENT Right 04/30/2016   Procedure: CYSTOSCOPY WITH URETEROSCOPY, STENT PLACEMENT REMOVAL;  Surgeon: [name], MD;  Location: [facility];  Service: Urology;  Laterality: Right;  . HERNIA REPAIR      . INCISION AND DRAINAGE BREAST ABSCESS  1973  . PACEMAKER INSERTION  Jan 2013  . PERMANENT PACEMAKER INSERTION N/A 07/29/2011   Procedure: PERMANENT PACEMAKER INSERTION;  Surgeon: [name], MD;  Location: [facility] CATH LAB;  Service: Cardiovascular;  Laterality: N/A;  . UMBILICAL HERNIA REPAIR  1950's    Current Outpatient Medications  Medication Sig Dispense Refill  . apixaban (ELIQUIS) 5 MG TABS tablet Take 1 tablet (5 mg total) 2 (two) times daily by mouth. 180 tablet 3  . Calcium Carbonate-Vitamin D (CALTRATE 600+D PO) Take 1 tablet by mouth daily.     . cetirizine (ZYRTEC) 10 MG tablet Take 10 mg by mouth daily as needed for allergies.     . Cholecalciferol (VITAMIN D3) 1000 UNITS tablet Take 1,000 Units by mouth at bedtime.     Marland Kitchen diltiazem (CARDIZEM CD) 240 MG 24 hr capsule TAKE 1 CAPSULE BY MOUTH DAILY 90 capsule 1  . furosemide (LASIX) 20 MG tablet TAKE 1 TABLET BY MOUTH DAILY 30 tablet 6  . irbesartan (AVAPRO) 150 MG tablet Take 1 tablet (150 mg total) by mouth daily. Please keep upcoming appt for future refills. Thank you 30 tablet 1  . labetalol (NORMODYNE) 300 MG tablet TAKE 1 TABLET BY MOUTH TWICE DAILY 60 tablet 11  . levothyroxine (SYNTHROID, LEVOTHROID) 25 MCG tablet Take 25 mcg by mouth daily before breakfast.    . potassium chloride (K-DUR) 10 MEQ tablet Take 1 tablet (10 mEq total) by mouth daily. 90 tablet 3  . WELCHOL 625 MG tablet Take 625 mg by mouth every morning and 1250 mg by mouth at bedtime.     No current facility-administered medications for this visit.     Allergies  Allergen Reactions  . Penicillins Anaphylaxis    Has patient had a PCN reaction causing immediate rash, facial/tongue/throat swelling, SOB or lightheadedness with hypotension: Yes Has patient had a PCN reaction causing severe rash involving mucus membranes or skin necrosis: No Has patient had a PCN reaction that required hospitalization No Has patient had a PCN reaction occurring within the  last 10 years: No If all of the above answers are "NO", then may proceed with Cephalosporin use.   . Statins Other (See Comments)    REACTION: elevated LFT's  . Tylenol [Acetaminophen] Other (See Comments)    If taken with Vytorin at risk for liver damage  . Amiodarone Other (See Comments)  . Tape Other (See Comments)    Redness, Please use "paper" tape only.  . Codeine Other (See Comments)    feel funny, head is fuzzy    Review of Systems negative except from HPI and PMH  Physical Exam BP (!) 142/84   Pulse 81   Ht 5' 6"  (1.676 m)   Wt 170 lb (77.1 kg)   SpO2 97%   BMI 27.44 kg/m  Well developed and nourished in no acute distress HENT normal Neck supple with JVP-flat Carotids brisk and full without bruits Clear Irregularly  irregular rate and rhythm with controlled ventricular response, no murmurs or gallops Abd-soft with active BS  No Clubbing cyanosis edema Skin-warm and dry A & Oriented  Grossly normal sensory and motor function   ECG demonstrates ventricular pacing at 82 Underlying atrial fibrillation -/11/41   Assessment and  Plan   Atrial fibrillaton permanent   Thoracic aortic aneurysm  Hypertension    Pacemaker The patient's device was interrogated.  The information was reviewed. No changes were made in the programming.     Iatrogenic hypothyrodism  HFpEF   On Anticoagulation;  No bleeding issues   Permanent atrial fibrillation with good rate control  We will check her CBC and renal function given her apixaban  Do not have a follow-up TSH.  We will recheck that today.  Given her edema, of asked her to increase her Lasix from 20-40 mg daily x3 days.  Her edema may also be related to her diltiazem  We spent more than 50% of our >25 min visit in face to face counseling regarding the above

## 2017-09-26 ENCOUNTER — Encounter: Payer: Self-pay | Admitting: Cardiology

## 2017-09-26 LAB — BASIC METABOLIC PANEL
BUN / CREAT RATIO: 14 (ref 12–28)
BUN: 12 mg/dL (ref 8–27)
CHLORIDE: 106 mmol/L (ref 96–106)
CO2: 23 mmol/L (ref 20–29)
Calcium: 9.8 mg/dL (ref 8.7–10.3)
Creatinine, Ser: 0.87 mg/dL (ref 0.57–1.00)
GFR calc non Af Amer: 61 mL/min/{1.73_m2} (ref >59)
GFR, EST AFRICAN AMERICAN: 71 mL/min/{1.73_m2} (ref >59)
GLUCOSE: 89 mg/dL (ref 65–99)
Potassium: 4 mmol/L (ref 3.5–5.2)
SODIUM: 143 mmol/L (ref 134–144)

## 2017-09-26 LAB — CBC WITH DIFFERENTIAL/PLATELET
BASOS ABS: 0 10*3/uL (ref 0.0–0.2)
Basos: 0 %
EOS (ABSOLUTE): 0.1 10*3/uL (ref 0.0–0.4)
EOS: 1 %
HEMATOCRIT: 40.4 % (ref 34.0–46.6)
HEMOGLOBIN: 13.4 g/dL (ref 11.1–15.9)
Immature Grans (Abs): 0 10*3/uL (ref 0.0–0.1)
Immature Granulocytes: 0 %
LYMPHS ABS: 1.4 10*3/uL (ref 0.7–3.1)
Lymphs: 22 %
MCH: 32.8 pg (ref 26.6–33.0)
MCHC: 33.2 g/dL (ref 31.5–35.7)
MCV: 99 fL — ABNORMAL HIGH (ref 79–97)
MONOCYTES: 12 %
MONOS ABS: 0.7 10*3/uL (ref 0.1–0.9)
NEUTROS ABS: 3.9 10*3/uL (ref 1.4–7.0)
Neutrophils: 65 %
Platelets: 222 10*3/uL (ref 150–379)
RBC: 4.09 x10E6/uL (ref 3.77–5.28)
RDW: 15.3 % (ref 12.3–15.4)
WBC: 6.1 10*3/uL (ref 3.4–10.8)

## 2017-09-26 LAB — CUP PACEART INCLINIC DEVICE CHECK
Battery Impedance: 456 Ohm
Battery Remaining Longevity: 103 mo
Battery Voltage: 2.79 V
Implantable Lead Implant Date: 20130114
Implantable Lead Location: 753860
Implantable Lead Model: 5076
Lead Channel Setting Pacing Pulse Width: 0.46 ms
Lead Channel Setting Sensing Sensitivity: 5.6 mV
MDC IDC LEAD IMPLANT DT: 20130114
MDC IDC LEAD LOCATION: 753859
MDC IDC MSMT LEADCHNL RA IMPEDANCE VALUE: 67 Ohm
MDC IDC MSMT LEADCHNL RV IMPEDANCE VALUE: 713 Ohm
MDC IDC MSMT LEADCHNL RV PACING THRESHOLD AMPLITUDE: 0.5 V
MDC IDC MSMT LEADCHNL RV PACING THRESHOLD PULSEWIDTH: 0.46 ms
MDC IDC MSMT LEADCHNL RV SENSING INTR AMPL: 22.4 mV
MDC IDC PG IMPLANT DT: 20130114
MDC IDC SESS DTM: 20190314190058
MDC IDC SET LEADCHNL RV PACING AMPLITUDE: 2.5 V
MDC IDC STAT BRADY RV PERCENT PACED: 46 %

## 2017-09-26 LAB — TSH: TSH: 1.93 u[IU]/mL (ref 0.450–4.500)

## 2017-09-29 ENCOUNTER — Other Ambulatory Visit: Payer: Self-pay | Admitting: Internal Medicine

## 2017-10-03 ENCOUNTER — Telehealth: Payer: Self-pay | Admitting: Internal Medicine

## 2017-10-03 NOTE — Telephone Encounter (Signed)
New Message:    Pt is experiencing a spike in her BP currently at 167/100 when she took in a few minutes ago.

## 2017-10-03 NOTE — Telephone Encounter (Signed)
I spoke with patient. Patient states she woke up this morning with a severe HA. She took an tylenol and blood pressure  Blood pressure prior to meds was 152/98 HR 107. Patient took her morning meds around 10am, irbesartan 150 mg, labetalol 300 mg, lasix 20 mg, welchol 625 mg, cardizem 240 mg and kdur 10 meq.   BP at 10:20 was 166/112  11AM 130/106  11:40 144/86  12AM 138/75 HR100 NOW 117/69 HR95   Patient states HA is better, denies any other symptoms. I advised patient to check blood pressure tonight and I would forward to Dr. Caryl Comes for further recommendations. Pt verbalized understanding and thankful for the call

## 2017-10-07 ENCOUNTER — Telehealth: Payer: Self-pay | Admitting: Internal Medicine

## 2017-10-07 NOTE — Telephone Encounter (Signed)
Pt c/o BP issue: STAT if pt c/o blurred vision, one-sided weakness or slurred speech  1. What are your last 5 BP readings? 366/815,947/07  2. Are you having any other symptoms (ex. Dizziness, headache, blurred vision, passed out)?headache   3. What is your BP issue? Blood pressure very high in the morning and by the afternoons its gets better .

## 2017-10-07 NOTE — Telephone Encounter (Signed)
LM on pt's VM

## 2017-10-08 NOTE — Telephone Encounter (Signed)
She can followup with erh PCP  thnks  Tell her I am going to be gone for a few weeks

## 2017-10-17 NOTE — Telephone Encounter (Signed)
Spoke with pt. She stated she followed up with her PCP. She had no additional concerns.

## 2017-10-27 ENCOUNTER — Telehealth: Payer: Self-pay

## 2017-10-27 ENCOUNTER — Encounter (HOSPITAL_COMMUNITY): Payer: Self-pay | Admitting: Emergency Medicine

## 2017-10-27 ENCOUNTER — Inpatient Hospital Stay (HOSPITAL_COMMUNITY)
Admission: EM | Admit: 2017-10-27 | Discharge: 2017-10-29 | DRG: 287 | Disposition: A | Discharge status: Home / Self Care | Payer: Medicare Other | Attending: Cardiology | Admitting: Cardiology

## 2017-10-27 ENCOUNTER — Emergency Department (HOSPITAL_COMMUNITY): Payer: Medicare Other

## 2017-10-27 DIAGNOSIS — R402362 Coma scale, best motor response, obeys commands, at arrival to emergency department: Secondary | ICD-10-CM | POA: Diagnosis present

## 2017-10-27 DIAGNOSIS — I482 Chronic atrial fibrillation: Secondary | ICD-10-CM | POA: Diagnosis not present

## 2017-10-27 DIAGNOSIS — Z888 Allergy status to other drugs, medicaments and biological substances status: Secondary | ICD-10-CM

## 2017-10-27 DIAGNOSIS — I25110 Atherosclerotic heart disease of native coronary artery with unstable angina pectoris: Principal | ICD-10-CM | POA: Diagnosis present

## 2017-10-27 DIAGNOSIS — Z87891 Personal history of nicotine dependence: Secondary | ICD-10-CM | POA: Diagnosis not present

## 2017-10-27 DIAGNOSIS — Z886 Allergy status to analgesic agent status: Secondary | ICD-10-CM

## 2017-10-27 DIAGNOSIS — Z79890 Hormone replacement therapy (postmenopausal): Secondary | ICD-10-CM

## 2017-10-27 DIAGNOSIS — I10 Essential (primary) hypertension: Secondary | ICD-10-CM

## 2017-10-27 DIAGNOSIS — E785 Hyperlipidemia, unspecified: Secondary | ICD-10-CM | POA: Diagnosis not present

## 2017-10-27 DIAGNOSIS — Z880 Allergy status to penicillin: Secondary | ICD-10-CM | POA: Diagnosis not present

## 2017-10-27 DIAGNOSIS — R402142 Coma scale, eyes open, spontaneous, at arrival to emergency department: Secondary | ICD-10-CM | POA: Diagnosis present

## 2017-10-27 DIAGNOSIS — R402252 Coma scale, best verbal response, oriented, at arrival to emergency department: Secondary | ICD-10-CM | POA: Diagnosis not present

## 2017-10-27 DIAGNOSIS — Z7901 Long term (current) use of anticoagulants: Secondary | ICD-10-CM | POA: Diagnosis not present

## 2017-10-27 DIAGNOSIS — E039 Hypothyroidism, unspecified: Secondary | ICD-10-CM | POA: Diagnosis present

## 2017-10-27 DIAGNOSIS — Z6825 Body mass index (BMI) 25.0-25.9, adult: Secondary | ICD-10-CM

## 2017-10-27 DIAGNOSIS — I208 Other forms of angina pectoris: Secondary | ICD-10-CM

## 2017-10-27 DIAGNOSIS — M546 Pain in thoracic spine: Secondary | ICD-10-CM

## 2017-10-27 DIAGNOSIS — I481 Persistent atrial fibrillation: Secondary | ICD-10-CM | POA: Diagnosis present

## 2017-10-27 DIAGNOSIS — I200 Unstable angina: Secondary | ICD-10-CM | POA: Diagnosis present

## 2017-10-27 DIAGNOSIS — Z885 Allergy status to narcotic agent status: Secondary | ICD-10-CM

## 2017-10-27 DIAGNOSIS — I712 Thoracic aortic aneurysm, without rupture: Secondary | ICD-10-CM | POA: Diagnosis not present

## 2017-10-27 DIAGNOSIS — I480 Paroxysmal atrial fibrillation: Secondary | ICD-10-CM | POA: Diagnosis present

## 2017-10-27 DIAGNOSIS — M353 Polymyalgia rheumatica: Secondary | ICD-10-CM | POA: Diagnosis present

## 2017-10-27 DIAGNOSIS — Z79899 Other long term (current) drug therapy: Secondary | ICD-10-CM

## 2017-10-27 DIAGNOSIS — Z950 Presence of cardiac pacemaker: Secondary | ICD-10-CM | POA: Diagnosis not present

## 2017-10-27 DIAGNOSIS — E663 Overweight: Secondary | ICD-10-CM | POA: Diagnosis present

## 2017-10-27 DIAGNOSIS — Z8249 Family history of ischemic heart disease and other diseases of the circulatory system: Secondary | ICD-10-CM

## 2017-10-27 DIAGNOSIS — Z8673 Personal history of transient ischemic attack (TIA), and cerebral infarction without residual deficits: Secondary | ICD-10-CM

## 2017-10-27 DIAGNOSIS — I25118 Atherosclerotic heart disease of native coronary artery with other forms of angina pectoris: Secondary | ICD-10-CM | POA: Diagnosis not present

## 2017-10-27 HISTORY — DX: Personal history of urinary calculi: Z87.442

## 2017-10-27 LAB — BASIC METABOLIC PANEL
ANION GAP: 10 (ref 5–15)
Anion gap: 9 (ref 5–15)
BUN: 17 mg/dL (ref 6–20)
BUN: 16 mg/dL (ref 6–20)
CALCIUM: 9.4 mg/dL (ref 8.9–10.3)
CALCIUM: 9.3 mg/dL (ref 8.9–10.3)
CO2: 20 mmol/L — AB (ref 22–32)
CO2: 20 mmol/L — ABNORMAL LOW (ref 22–32)
CREATININE: 1.04 mg/dL — AB (ref 0.44–1.00)
Chloride: 108 mmol/L (ref 101–111)
Chloride: 108 mmol/L (ref 101–111)
Creatinine, Ser: 1.15 mg/dL — ABNORMAL HIGH (ref 0.44–1.00)
GFR calc Af Amer: 56 mL/min — ABNORMAL LOW (ref >60)
GFR calc non Af Amer: 42 mL/min — ABNORMAL LOW (ref >60)
GFR, EST AFRICAN AMERICAN: 49 mL/min — AB (ref >60)
GFR, EST NON AFRICAN AMERICAN: 48 mL/min — AB (ref >60)
Glucose, Bld: 95 mg/dL (ref 65–99)
Glucose, Bld: 84 mg/dL (ref 65–99)
POTASSIUM: 4.1 mmol/L (ref 3.5–5.1)
Potassium: 4.4 mmol/L (ref 3.5–5.1)
SODIUM: 137 mmol/L (ref 135–145)
Sodium: 138 mmol/L (ref 135–145)

## 2017-10-27 LAB — HEPATIC FUNCTION PANEL
ALBUMIN: 3.5 g/dL (ref 3.5–5.0)
ALT: 14 U/L (ref 14–54)
AST: 19 U/L (ref 15–41)
Alkaline Phosphatase: 72 U/L (ref 38–126)
BILIRUBIN INDIRECT: 0.8 mg/dL (ref 0.3–0.9)
BILIRUBIN TOTAL: 1 mg/dL (ref 0.3–1.2)
Bilirubin, Direct: 0.2 mg/dL (ref 0.1–0.5)
Total Protein: 6.4 g/dL — ABNORMAL LOW (ref 6.5–8.1)

## 2017-10-27 LAB — LIPASE, BLOOD: Lipase: 39 U/L (ref 11–51)

## 2017-10-27 LAB — PROTIME-INR
INR: 1.42
INR: 1.29
PROTHROMBIN TIME: 17.2 s — AB (ref 11.4–15.2)
PROTHROMBIN TIME: 16 s — AB (ref 11.4–15.2)

## 2017-10-27 LAB — CBC
HCT: 41 % (ref 36.0–46.0)
HCT: 40.3 % (ref 36.0–46.0)
Hemoglobin: 13.7 g/dL (ref 12.0–15.0)
Hemoglobin: 13.6 g/dL (ref 12.0–15.0)
MCH: 32.2 pg (ref 26.0–34.0)
MCH: 32.5 pg (ref 26.0–34.0)
MCHC: 33.4 g/dL (ref 30.0–36.0)
MCHC: 33.7 g/dL (ref 30.0–36.0)
MCV: 96.5 fL (ref 78.0–100.0)
MCV: 96.4 fL (ref 78.0–100.0)
PLATELETS: 221 10*3/uL (ref 150–400)
Platelets: 224 10*3/uL (ref 150–400)
RBC: 4.25 MIL/uL (ref 3.87–5.11)
RBC: 4.18 MIL/uL (ref 3.87–5.11)
RDW: 14.1 % (ref 11.5–15.5)
RDW: 14.1 % (ref 11.5–15.5)
WBC: 7 10*3/uL (ref 4.0–10.5)
WBC: 6.3 10*3/uL (ref 4.0–10.5)

## 2017-10-27 LAB — TROPONIN I

## 2017-10-27 LAB — I-STAT TROPONIN, ED: TROPONIN I, POC: 0.01 ng/mL (ref 0.00–0.08)

## 2017-10-27 MED ORDER — SODIUM CHLORIDE 0.9 % WEIGHT BASED INFUSION
1.0000 mL/kg/h | INTRAVENOUS | Status: DC
  Administered 2017-10-27 – 2017-10-28 (×2): 1 mL/kg/h via INTRAVENOUS

## 2017-10-27 MED ORDER — SODIUM CHLORIDE 0.9% FLUSH: 3.0000 mL | INTRAVENOUS | Status: DC | PRN

## 2017-10-27 MED ORDER — SODIUM CHLORIDE 0.9% FLUSH
3.0000 mL | Freq: Two times a day (BID) | INTRAVENOUS | Status: DC
  Administered 2017-10-27: 3 mL via INTRAVENOUS

## 2017-10-27 MED ORDER — ASPIRIN 81 MG PO CHEW: 81.0000 mg | CHEWABLE_TABLET | ORAL | Status: AC

## 2017-10-27 MED ORDER — SODIUM CHLORIDE 0.9 % IV SOLN: 250.0000 mL | INTRAVENOUS | Status: DC | PRN

## 2017-10-27 NOTE — ED Triage Notes (Signed)
PT reports heaviness over her upper back that radiates down both arms. PT denies discomfort in chest. Sensation is intermittent and starts suddenly. It often occurs when she is up moving around or walking, but has sometimes occurred while still. PT is not having any discomfort right now. These episodes have been occurring for months. Only cardiac issue is afib.   PT does have a pacemaker for afib.

## 2017-10-27 NOTE — ED Provider Notes (Signed)
[facility] EMERGENCY DEPARTMENT Provider Note   CSN: [id number] Arrival date & time: 10/27/17  1557     History   Chief Complaint Chief Complaint  Patient presents with  . Chest Pain  . Back Pain    HPI [name] [name] is a 84 y.o. female.  HPI Patient has been experiencing midthoracic back pain that radiates into both arms.  She reports that it has been coming and going now for several months.  She reports that it does seem to come on more intensely if she is doing activity.  It then starts to abate spontaneously.  Patient does have history of chronic atrial fibrillation and takes Eliquis.  She reports she has had today's dose.  No active pain at this time.  Patient finally mentioned this to her outpatient provider.  EKGs were done.  There was concern for cardiac ischemic etiology and patient was referred to the emergency department. Past Medical History:  Diagnosis Date  . Ascending aorta enlargement (HCC)    4.8 cm per echo 08/07/11; 5.0 by CT in Jan 2013  . CAD (coronary artery disease)    LHC 5/08:  pOM 20%, pRCA 20-30%, EF 60%  . Hemorrhoids    current problem as of 04/26/16  with slight bleeding of hemorrhoids   . Hepatitis    hx of medication induced hepatitis (Vytorin per pt)  . History of loop recorder   . HLD (hyperlipidemia)   . HTN (hypertension)   . Hx of echocardiogram    echo 1/13: EF 55%, Gr 2 diast dysfn, Asc Ao aneurysm 4.8 cm, mild MR, mod LAE, mild RAE  . Hypothyroidism   . ICH (intracerebral hemorrhage) ([place]) 03/24/15  . NASH (nonalcoholic steatohepatitis)   . Neuromuscular disorder ([place])   . Pacemaker-Medtronic 11/12/2011   Implanted 2013   . Persistent atrial fibrillation ([place])   . PMR (polymyalgia rheumatica) (HCC)    no steriods for 5 years  . sinus node dysfunction//post termination pauses   . Stroke ([place])   . Urinary tract infection    Recurrent infections.     Patient Active Problem List   Diagnosis Date Noted  .  Migraine equivalent 01/06/2017  . Sepsis secondary to UTI ([place])   . Calculus of kidney   . Persistent atrial fibrillation ([place])   . Chronic diastolic CHF (congestive heart failure) ([place])   . AAA (abdominal aortic aneurysm) without rupture ([place])   . PMR (polymyalgia rheumatica) (HCC)   . Hyperthyroidism   . Calculus of ureter   . Paroxysmal atrial fibrillation (HCC)   . CAD in native artery   . Other specified hypothyroidism   . Sepsis ([place]) 04/22/2016  . Elevated LFTs 04/22/2016  . Hydronephrosis 04/22/2016  . Renal lithiasis 04/22/2016  . UTI (urinary tract infection) 04/22/2016  . Acute kidney injury ([place]) 04/22/2016  . Atrial flutter ([place])   . Nontraumatic cortical hemorrhage of left cerebral hemisphere ([place]) 05/03/2015  . Chronic anticoagulation 05/03/2015  . Cardiac pacemaker in situ 05/03/2015  . ICH (intracerebral hemorrhage) ([place]) 03/25/2015  . Encounter for therapeutic drug monitoring 10/04/2013  . Dyspnea on exertion 07/26/2013  . Chest pain 02/04/2013  . Orthostatic lightheadedness 06/25/2012  . Pacemaker-Medtronic 11/12/2011  . Ascending aorta enlargement ([place]) 08/12/2011  . Chronic diastolic heart failure ([place]) 12/27/2010  . Long term (current) use of anticoagulants 10/15/2010  . PREMATURE VENTRICULAR CONTRACTIONS 10/17/2009  . post termination pauses/.sinus bradycardia 11/17/2008  . Essential hypertension 04/28/2008  . Atrial fibrillation ([place]) 04/28/2008  Past Surgical History:  Procedure Laterality Date  . CARDIAC CATHETERIZATION    . CARDIOVERSION  03/02/2012   Procedure: CARDIOVERSION;  Surgeon: Lelon Perla, MD;  Location: [facility] ENDOSCOPY;  Service: Cardiovascular;  Laterality: N/A;  . CARDIOVERSION  03/13/2012   Procedure: CARDIOVERSION;  Surgeon: Josue Hector, MD;  Location: [place];  Service: Cardiovascular;  Laterality: N/A;  . CARDIOVERSION  04/15/2012   Procedure: CARDIOVERSION;  Surgeon: [name], MD;  Location: [place];   Service: Cardiovascular;  Laterality: N/A;  . CARDIOVERSION N/A 06/07/2014   Procedure: CARDIOVERSION;  Surgeon: [name], MD;  Location: [facility] ENDOSCOPY;  Service: Cardiovascular;  Laterality: N/A;  . CARDIOVERSION N/A 08/18/2014   Procedure: CARDIOVERSION;  Surgeon: [name], MD;  Location: [place];  Service: Cardiovascular;  Laterality: N/A;  . CARDIOVERSION N/A 09/06/2015   Procedure: CARDIOVERSION;  Surgeon: Jerline Pain, MD;  Location: [place];  Service: Cardiovascular;  Laterality: N/A;  . CYSTOSCOPY WITH STENT PLACEMENT Right 04/22/2016   Procedure: CYSTOSCOPY WITH RIGHT URETERAL STENT INSERTION;  Surgeon: [name], MD;  Location: [place];  Service: Urology;  Laterality: Right;  . CYSTOSCOPY WITH URETEROSCOPY, STONE BASKETRY AND STENT PLACEMENT Right 04/30/2016   Procedure: CYSTOSCOPY WITH URETEROSCOPY, STENT PLACEMENT REMOVAL;  Surgeon: [name], MD;  Location: [facility];  Service: Urology;  Laterality: Right;  . HERNIA REPAIR    . INCISION AND DRAINAGE BREAST ABSCESS  1973  . PACEMAKER INSERTION  Jan 2013  . PERMANENT PACEMAKER INSERTION N/A 07/29/2011   Procedure: PERMANENT PACEMAKER INSERTION;  Surgeon: [name], MD;  Location: [facility] CATH LAB;  Service: Cardiovascular;  Laterality: N/A;  . UMBILICAL HERNIA REPAIR  1950's     OB History   None      Home Medications    Prior to Admission medications   Medication Sig Start Date End Date Taking? Authorizing Provider  apixaban (ELIQUIS) 5 MG TABS tablet Take 1 tablet (5 mg total) 2 (two) times daily by mouth. 05/27/17   Rosalin Hawking, MD  Calcium Carbonate-Vitamin D (CALTRATE 600+D PO) Take 1 tablet by mouth daily.     [provider]  cetirizine (ZYRTEC) 10 MG tablet Take 10 mg by mouth daily as needed for allergies.     [provider]  Cholecalciferol (VITAMIN D3) 1000 UNITS tablet Take 1,000 Units by mouth at bedtime.     [provider]  diltiazem (CARDIZEM CD) 240 MG 24 hr  capsule TAKE 1 CAPSULE BY MOUTH DAILY 09/30/17   [name], MD  furosemide (LASIX) 20 MG tablet TAKE 1 TABLET BY MOUTH DAILY 09/08/17   [name], MD  irbesartan (AVAPRO) 150 MG tablet Take 1 tablet (150 mg total) by mouth daily. Please keep upcoming appt for future refills. Thank you 08/25/17   [name], MD  labetalol (NORMODYNE) 300 MG tablet TAKE 1 TABLET BY MOUTH TWICE DAILY 11/05/16   [name], MD  levothyroxine (SYNTHROID, LEVOTHROID) 25 MCG tablet Take 25 mcg by mouth daily before breakfast.    [provider]  potassium chloride (K-DUR) 10 MEQ tablet Take 1 tablet (10 mEq total) by mouth daily. 09/25/17   [name], MD  [facility] 625 MG tablet Take 625 mg by mouth every morning and 1250 mg by mouth at bedtime. 05/08/12   [provider]    Family History Family History  Problem Relation Age of Onset  . Coronary artery disease Mother   . Coronary artery disease  Unknown   . Alzheimer's disease Unknown   . Coronary artery disease Brother   . Coronary artery disease Brother     Social History Social History   Tobacco Use  . Smoking status: Former Smoker    Types: Cigarettes    Last attempt to quit: 07/16/1990    Years since quitting: 27.3  . Smokeless tobacco: Never Used  Substance Use Topics  . Alcohol use: No  . Drug use: No     Allergies   Penicillins; Statins; Tylenol [acetaminophen]; Amiodarone; Tape; and Codeine   Review of Systems Review of Systems 10 Systems reviewed and are negative for acute change except as noted in the HPI.   Physical Exam Updated Vital Signs BP 114/64   Pulse 67   Resp 16   Wt 74.8 kg (165 lb)   SpO2 94%   BMI 26.63 kg/m   Physical Exam  Constitutional: She is oriented to person, place, and time. She appears well-developed and well-nourished. No distress.  Patient is well in appearance.  Alert and nontoxic.  No distress.  HENT:  Head: Normocephalic and atraumatic.  Eyes: EOM are  normal.  Neck: Neck supple.  Cardiovascular: Normal rate, normal heart sounds and intact distal pulses.  Irregularly irregular.  Pulmonary/Chest: Effort normal and breath sounds normal.  Abdominal: Soft. Bowel sounds are normal. She exhibits no distension. There is no tenderness.  Musculoskeletal: Normal range of motion. She exhibits edema.  1+ edema.  Patient has significant skin thinning of the lower extremities but no active wounds or erythema.  Pigmentation consistent with chronic venous stasis.  Calves are nontender.  Neurological: She is alert and oriented to person, place, and time. She has normal strength. No cranial nerve deficit. She exhibits normal muscle tone. Coordination normal. GCS eye subscore is 4. GCS verbal subscore is 5. GCS motor subscore is 6.  Skin: Skin is warm, dry and intact.  Psychiatric: She has a normal mood and affect.     ED Treatments / Results  Labs (all labs ordered are listed, but only abnormal results are displayed) Labs Reviewed  BASIC METABOLIC PANEL - Abnormal; Notable for the following components:      Result Value   CO2 20 (*)    Creatinine, Ser 1.15 (*)    GFR calc non Af Amer 42 (*)    GFR calc Af Amer 49 (*)    All other components within normal limits  PROTIME-INR - Abnormal; Notable for the following components:   Prothrombin Time 17.2 (*)    All other components within normal limits  HEPATIC FUNCTION PANEL - Abnormal; Notable for the following components:   Total Protein 6.4 (*)    All other components within normal limits  PROTIME-INR - Abnormal; Notable for the following components:   Prothrombin Time 16.0 (*)    All other components within normal limits  CBC  LIPASE, BLOOD  CBC  TROPONIN I  TROPONIN I  TROPONIN I  BASIC METABOLIC PANEL  I-STAT TROPONIN, ED    EKG EKG Interpretation  Date/Time:  Monday October 27 2017 16:05:10 EDT Ventricular Rate:  84 PR Interval:    QRS Duration: 162 QT Interval:  456 QTC  Calculation: 540 R Axis:   -70 Text Interpretation:  Atrial fibrillation Left bundle branch block old LBBB. lateral T wave peaked new. Confirmed by Charlesetta Shanks [phone]) on 10/27/2017 4:31:03 PM   Radiology Dg Chest 2 View  Result Date: 10/27/2017 CLINICAL DATA:  84 year old female with heaviness over the  upper back radiating down both arms EXAM: CHEST - 2 VIEW COMPARISON:  Prior chest x-ray 05/22/2016; prior chest CT 07/22/2017 FINDINGS: Cardiac and mediastinal contours remain unchanged. Stable position of left subclavian approach cardiac rhythm maintenance device with leads projecting over the right atrium and right ventricle. Atherosclerotic calcifications are present in the transverse aorta. Stable chronic bronchitic changes. No new airspace opacity, pulmonary edema, pleural effusion or pneumothorax. The bones appear demineralized but there is no acute osseous abnormality. IMPRESSION: No active cardiopulmonary disease. Aortic Atherosclerosis (ICD10-170.0) Electronically Signed   By: Jacqulynn Cadet M.D.   On: 10/27/2017 17:50    Procedures Procedures (including critical care time)  Medications Ordered in ED Medications  sodium chloride flush (NS) 0.9 % injection 3 mL (has no administration in time range)  sodium chloride flush (NS) 0.9 % injection 3 mL (has no administration in time range)  0.9 %  sodium chloride infusion (has no administration in time range)  aspirin chewable tablet 81 mg (has no administration in time range)  0.9% sodium chloride infusion (has no administration in time range)     Initial Impression / Assessment and Plan / ED Course  I have reviewed the triage vital signs and the nursing notes.  Pertinent labs & imaging results that were available during my care of the patient were reviewed by me and considered in my medical decision making (see chart for details).    Consult: Cardiology for definitive management  Final Clinical Impressions(s) / ED Diagnoses     Final diagnoses:  Anginal equivalent ([place])  Acute bilateral thoracic back pain  Patient has been experiencing thoracic back pain that radiates into both arms.  It has occurred mostly with exertion and then begins to abate with rest.  Patient has history of chronic atrial fibrillation and is anticoagulated on Eliquis.  Her EKG does suggest some hyper acute T waves laterally as compared to previous EKGs.  She does not have any active thoracic or chest pain at the time of her evaluation.  There is concern for anginal equivalent.  Patient has been in by cardiology in consultation and plan is for admission and interventional study.  ED Discharge Orders    None       Charlesetta Shanks, MD 10/27/17 2028

## 2017-10-27 NOTE — Consult Note (Signed)
Cardiology Consultation:   Patient ID: [name] [name]; [id number]; 1931/06/12   Admit date: 10/27/2017 Date of Consult: 10/27/2017  Primary Care Provider: [name], MD Primary Cardiologist: [name] Axe, MD  Primary Electrophysiologist:  [name] [name]    Patient Profile:   [name] [name] is a 84 y.o. female with a hx of chronic atrial fibrillation who is being seen today for the evaluation of back and arm pain at the request of [name] [name].  History of Present Illness:   [name] [name] is a 84 year old mildly overweight married Caucasian female mother of 3 children who is followed by [name] [name] for chronic A. fib status post permanent transvenous pacemaker in mentation remotely. She has a history of permanent A. fib rate controlled on Eliquis oral anticoagulation. She took a dose this morning. She does have treated hypertension and a thoracic aortic aneurysm which has been followed by [name] [name] for the last 7 years and has remained stable. She had a left heart cath 5/08 revealing noncritical CAD. LV function is normal. She describes back and arm pain intermittently over the last 4-5 months which is now becoming more frequent and severe over the last several days now occurring daily for the last 3-4 days. She denies chest pain. There is no associated shortness of breath.  Past Medical History:  Diagnosis Date  . Ascending aorta enlargement (HCC)    4.8 cm per echo 08/07/11; 5.0 by CT in Jan 2013  . CAD (coronary artery disease)    LHC 5/08:  pOM 20%, pRCA 20-30%, EF 60%  . Hemorrhoids    current problem as of 04/26/16  with slight bleeding of hemorrhoids   . Hepatitis    hx of medication induced hepatitis (Vytorin per pt)  . History of loop recorder   . HLD (hyperlipidemia)   . HTN (hypertension)   . Hx of echocardiogram    echo 1/13: EF 55%, Gr 2 diast dysfn, Asc Ao aneurysm 4.8 cm, mild MR, mod LAE, mild RAE  . Hypothyroidism   . ICH (intracerebral hemorrhage) ([place]) 03/24/15  . NASH  (nonalcoholic steatohepatitis)   . Neuromuscular disorder ([place])   . Pacemaker-Medtronic 11/12/2011   Implanted 2013   . Persistent atrial fibrillation ([place])   . PMR (polymyalgia rheumatica) (HCC)    no steriods for 5 years  . sinus node dysfunction//post termination pauses   . Stroke ([place])   . Urinary tract infection    Recurrent infections.     Past Surgical History:  Procedure Laterality Date  . CARDIAC CATHETERIZATION    . CARDIOVERSION  03/02/2012   Procedure: CARDIOVERSION;  Surgeon: Lelon Perla, MD;  Location: [facility] ENDOSCOPY;  Service: Cardiovascular;  Laterality: N/A;  . CARDIOVERSION  03/13/2012   Procedure: CARDIOVERSION;  Surgeon: Josue Hector, MD;  Location: [place];  Service: Cardiovascular;  Laterality: N/A;  . CARDIOVERSION  04/15/2012   Procedure: CARDIOVERSION;  Surgeon: [name], MD;  Location: [facility] ENDOSCOPY;  Service: Cardiovascular;  Laterality: N/A;  . CARDIOVERSION N/A 06/07/2014   Procedure: CARDIOVERSION;  Surgeon: [name], MD;  Location: [facility] ENDOSCOPY;  Service: Cardiovascular;  Laterality: N/A;  . CARDIOVERSION N/A 08/18/2014   Procedure: CARDIOVERSION;  Surgeon: [name], MD;  Location: [place];  Service: Cardiovascular;  Laterality: N/A;  . CARDIOVERSION N/A 09/06/2015   Procedure: CARDIOVERSION;  Surgeon: Jerline Pain, MD;  Location: [place];  Service: Cardiovascular;  Laterality: N/A;  . CYSTOSCOPY WITH STENT PLACEMENT Right 04/22/2016   Procedure: CYSTOSCOPY WITH RIGHT URETERAL  STENT INSERTION;  Surgeon: [name], MD;  Location: [place];  Service: Urology;  Laterality: Right;  . CYSTOSCOPY WITH URETEROSCOPY, STONE BASKETRY AND STENT PLACEMENT Right 04/30/2016   Procedure: CYSTOSCOPY WITH URETEROSCOPY, STENT PLACEMENT REMOVAL;  Surgeon: [name], MD;  Location: [facility];  Service: Urology;  Laterality: Right;  . HERNIA REPAIR    . INCISION AND DRAINAGE BREAST ABSCESS  1973  . PACEMAKER INSERTION  Jan 2013  .  PERMANENT PACEMAKER INSERTION N/A 07/29/2011   Procedure: PERMANENT PACEMAKER INSERTION;  Surgeon: [name], MD;  Location: [facility] CATH LAB;  Service: Cardiovascular;  Laterality: N/A;  . UMBILICAL HERNIA REPAIR  1950's     Home Medications:  Prior to Admission medications   Medication Sig Start Date End Date Taking? Authorizing Provider  apixaban (ELIQUIS) 5 MG TABS tablet Take 1 tablet (5 mg total) 2 (two) times daily by mouth. 05/27/17   Rosalin Hawking, MD  Calcium Carbonate-Vitamin D (CALTRATE 600+D PO) Take 1 tablet by mouth daily.     [provider]  cetirizine (ZYRTEC) 10 MG tablet Take 10 mg by mouth daily as needed for allergies.     [provider]  Cholecalciferol (VITAMIN D3) 1000 UNITS tablet Take 1,000 Units by mouth at bedtime.     [provider]  diltiazem (CARDIZEM CD) 240 MG 24 hr capsule TAKE 1 CAPSULE BY MOUTH DAILY 09/30/17   [name], MD  furosemide (LASIX) 20 MG tablet TAKE 1 TABLET BY MOUTH DAILY 09/08/17   [name], MD  irbesartan (AVAPRO) 150 MG tablet Take 1 tablet (150 mg total) by mouth daily. Please keep upcoming appt for future refills. Thank you 08/25/17   [name], MD  labetalol (NORMODYNE) 300 MG tablet TAKE 1 TABLET BY MOUTH TWICE DAILY 11/05/16   [name], MD  levothyroxine (SYNTHROID, LEVOTHROID) 25 MCG tablet Take 25 mcg by mouth daily before breakfast.    [provider]  potassium chloride (K-DUR) 10 MEQ tablet Take 1 tablet (10 mEq total) by mouth daily. 09/25/17   [name], MD  [facility] 625 MG tablet Take 625 mg by mouth every morning and 1250 mg by mouth at bedtime. 05/08/12   [provider]    Inpatient Medications: Scheduled Meds:  Continuous Infusions:  PRN Meds:   Allergies:    Allergies  Allergen Reactions  . Penicillins Anaphylaxis    Has patient had a PCN reaction causing immediate rash, facial/tongue/throat swelling, SOB or lightheadedness with hypotension:  Yes Has patient had a PCN reaction causing severe rash involving mucus membranes or skin necrosis: No Has patient had a PCN reaction that required hospitalization No Has patient had a PCN reaction occurring within the last 10 years: No If all of the above answers are "NO", then may proceed with Cephalosporin use.   . Statins Other (See Comments)    REACTION: elevated LFT's  . Tylenol [Acetaminophen] Other (See Comments)    If taken with Vytorin at risk for liver damage  . Amiodarone Other (See Comments)  . Tape Other (See Comments)    Redness, Please use "paper" tape only.  . Codeine Other (See Comments)    feel funny, head is fuzzy    Social History:   Social History   Socioeconomic History  . Marital status: Married    Spouse name: Not on file  . Number of children: Not on file  . Years of education: Not on file  . Highest education level:  Not on file  Occupational History  . Not on file  Social Needs  . Financial resource strain: Not on file  . Food insecurity:    Worry: Not on file    Inability: Not on file  . Transportation needs:    Medical: Not on file    Non-medical: Not on file  Tobacco Use  . Smoking status: Former Smoker    Types: Cigarettes    Last attempt to quit: 07/16/1990    Years since quitting: 27.3  . Smokeless tobacco: Never Used  Substance and Sexual Activity  . Alcohol use: No  . Drug use: No  . Sexual activity: Not on file  Lifestyle  . Physical activity:    Days per week: Not on file    Minutes per session: Not on file  . Stress: Not on file  Relationships  . Social connections:    Talks on phone: Not on file    Gets together: Not on file    Attends religious service: Not on file    Active member of club or organization: Not on file    Attends meetings of clubs or organizations: Not on file    Relationship status: Not on file  . Intimate partner violence:    Fear of current or ex partner: Not on file    Emotionally abused: Not on file     Physically abused: Not on file    Forced sexual activity: Not on file  Other Topics Concern  . Not on file  Social History Narrative   Pt lives in [place] with spouse.  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 Coronary artery disease Mother   . Coronary artery disease Unknown   . Alzheimer's disease Unknown   . Coronary artery disease Brother   . Coronary artery disease Brother      ROS:  Please see the history of present illness.   All other ROS reviewed and negative.     Physical Exam/Data:   Vitals:   10/27/17 1621  Weight: 165 lb (74.8 kg)   No intake or output data in the 24 hours ending 10/27/17 1746 Filed Weights   10/27/17 1621  Weight: 165 lb (74.8 kg)   Body mass index is 26.63 kg/m.  General:  Well nourished, well developed, in no acute distress HEENT: normal Lymph: no adenopathy Neck: no JVD Endocrine:  No thryomegaly Vascular: No carotid bruits; FA pulses 2+ bilaterally without bruits  Cardiac:  Irregularly irregular; no murmur  Lungs:  clear to auscultation bilaterally, no wheezing, rhonchi or rales  Abd: soft, nontender, no hepatomegaly  Ext: no edema Musculoskeletal:  No deformities, BUE and BLE strength normal and equal Skin: warm and dry  Neuro:  CNs 2-12 intact, no focal abnormalities noted Psych:  Normal affect   EKG:  The EKG was personally reviewed and demonstrates:  Ventricular lead paced rhythm at 84 Telemetry:  Telemetry was personally reviewed and demonstrates:  Atrial fibrillation with intermittent paced beats  Relevant CV Studies: Not applicable  Laboratory Data:  ChemistryNo results for input(s): NA, K, CL, CO2, GLUCOSE, BUN, CREATININE, CALCIUM, GFRNONAA, GFRAA, ANIONGAP in the last 168 hours.  No results for input(s): PROT, ALBUMIN, AST, ALT, ALKPHOS, BILITOT in the last 168 hours. Hematology Recent Labs  Lab 10/27/17 1706  WBC 7.0   RBC 4.25  HGB 13.7  HCT 41.0  MCV 96.5  MCH  32.2  MCHC 33.4  RDW 14.1  PLT 224   Cardiac EnzymesNo results for input(s): TROPONINI in the last 168 hours.  Recent Labs  Lab 10/27/17 1712  TROPIPOC 0.01    BNPNo results for input(s): BNP, PROBNP in the last 168 hours.  DDimer No results for input(s): DDIMER in the last 168 hours.  Radiology/Studies:  No results found.  Assessment and Plan:   1. Back and arm pain-her symptoms are worrisome for angina. They're somewhat atypical. She did have a negative cath 10 years ago. Given the accelerated nature of her symptoms I'm inclined to proceed with radial diagnostic coronary angiography. She did take liquids this morning. This will need to be held. We can start heparin tomorrow. She may be able to be cathed Tomorrow. The patient understands that risks included but are not limited to stroke (1 in 1000), death (1 in 88), kidney failure [usually temporary] (1 in 500), bleeding (1 in 200), allergic reaction [possibly serious] (1 in 200). The patient understands and agrees to proceed 2. Chronic atrial fibrillation-status post permanent transvenous pacemaker insertion on Eliquis oral anticoagulation which will be held. 3. Essential hypertension-controlled on labetalol and enalapril 4. Thoracic aortic aneurysm-stable and followed by Dr. Servando Snare  as an outpatient.   For questions or updates, please contact [place] Please consult [email or web address] for contact info under Cardiology/STEMI.   Signed, Quay Burow, MD  10/27/2017 5:46 PM

## 2017-10-27 NOTE — Telephone Encounter (Signed)
[name] [name], from Dr Julianne Rice office at [facility] states pt is in their office today having active chest pain. I advised her we always suggest pt's visit the ED with active chest pain for evaluation. She stated she would let the patient know.

## 2017-10-27 NOTE — ED Notes (Signed)
Patient transported to X-ray 

## 2017-10-28 ENCOUNTER — Other Ambulatory Visit: Payer: Self-pay

## 2017-10-28 ENCOUNTER — Inpatient Hospital Stay (HOSPITAL_COMMUNITY)
Admission: EM | Disposition: A | Discharge status: Home / Self Care | Payer: Self-pay | Source: Home / Self Care | Attending: Cardiology

## 2017-10-28 ENCOUNTER — Encounter (HOSPITAL_COMMUNITY): Payer: Self-pay | Admitting: General Practice

## 2017-10-28 DIAGNOSIS — Z950 Presence of cardiac pacemaker: Secondary | ICD-10-CM | POA: Diagnosis not present

## 2017-10-28 DIAGNOSIS — I10 Essential (primary) hypertension: Secondary | ICD-10-CM | POA: Diagnosis not present

## 2017-10-28 DIAGNOSIS — I200 Unstable angina: Secondary | ICD-10-CM | POA: Diagnosis present

## 2017-10-28 DIAGNOSIS — I25110 Atherosclerotic heart disease of native coronary artery with unstable angina pectoris: Secondary | ICD-10-CM | POA: Diagnosis not present

## 2017-10-28 DIAGNOSIS — Z8673 Personal history of transient ischemic attack (TIA), and cerebral infarction without residual deficits: Secondary | ICD-10-CM | POA: Diagnosis not present

## 2017-10-28 DIAGNOSIS — R402362 Coma scale, best motor response, obeys commands, at arrival to emergency department: Secondary | ICD-10-CM | POA: Diagnosis not present

## 2017-10-28 DIAGNOSIS — I25118 Atherosclerotic heart disease of native coronary artery with other forms of angina pectoris: Secondary | ICD-10-CM | POA: Diagnosis not present

## 2017-10-28 DIAGNOSIS — I208 Other forms of angina pectoris: Secondary | ICD-10-CM | POA: Diagnosis present

## 2017-10-28 DIAGNOSIS — Z880 Allergy status to penicillin: Secondary | ICD-10-CM | POA: Diagnosis not present

## 2017-10-28 DIAGNOSIS — R402142 Coma scale, eyes open, spontaneous, at arrival to emergency department: Secondary | ICD-10-CM | POA: Diagnosis not present

## 2017-10-28 DIAGNOSIS — M546 Pain in thoracic spine: Secondary | ICD-10-CM | POA: Diagnosis not present

## 2017-10-28 DIAGNOSIS — Z87891 Personal history of nicotine dependence: Secondary | ICD-10-CM | POA: Diagnosis not present

## 2017-10-28 DIAGNOSIS — R402252 Coma scale, best verbal response, oriented, at arrival to emergency department: Secondary | ICD-10-CM | POA: Diagnosis not present

## 2017-10-28 DIAGNOSIS — I480 Paroxysmal atrial fibrillation: Secondary | ICD-10-CM | POA: Diagnosis not present

## 2017-10-28 DIAGNOSIS — I481 Persistent atrial fibrillation: Secondary | ICD-10-CM | POA: Diagnosis not present

## 2017-10-28 DIAGNOSIS — E785 Hyperlipidemia, unspecified: Secondary | ICD-10-CM | POA: Diagnosis not present

## 2017-10-28 DIAGNOSIS — Z888 Allergy status to other drugs, medicaments and biological substances status: Secondary | ICD-10-CM | POA: Diagnosis not present

## 2017-10-28 DIAGNOSIS — I712 Thoracic aortic aneurysm, without rupture: Secondary | ICD-10-CM | POA: Diagnosis not present

## 2017-10-28 DIAGNOSIS — Z79899 Other long term (current) drug therapy: Secondary | ICD-10-CM | POA: Diagnosis not present

## 2017-10-28 DIAGNOSIS — Z7901 Long term (current) use of anticoagulants: Secondary | ICD-10-CM | POA: Diagnosis not present

## 2017-10-28 DIAGNOSIS — I482 Chronic atrial fibrillation: Secondary | ICD-10-CM | POA: Diagnosis not present

## 2017-10-28 HISTORY — PX: LEFT HEART CATH AND CORONARY ANGIOGRAPHY: CATH118249

## 2017-10-28 LAB — BASIC METABOLIC PANEL
Anion gap: 11 (ref 5–15)
BUN: 16 mg/dL (ref 6–20)
CHLORIDE: 108 mmol/L (ref 101–111)
CO2: 19 mmol/L — AB (ref 22–32)
Calcium: 8.9 mg/dL (ref 8.9–10.3)
Creatinine, Ser: 0.95 mg/dL (ref 0.44–1.00)
GFR calc Af Amer: >60 mL/min (ref >60)
GFR, EST NON AFRICAN AMERICAN: 53 mL/min — AB (ref >60)
GLUCOSE: 84 mg/dL (ref 65–99)
Potassium: 3.9 mmol/L (ref 3.5–5.1)
Sodium: 138 mmol/L (ref 135–145)

## 2017-10-28 LAB — CBC
HCT: 38.9 % (ref 36.0–46.0)
HEMOGLOBIN: 13.1 g/dL (ref 12.0–15.0)
MCH: 32.8 pg (ref 26.0–34.0)
MCHC: 33.7 g/dL (ref 30.0–36.0)
MCV: 97.3 fL (ref 78.0–100.0)
Platelets: 203 10*3/uL (ref 150–400)
RBC: 4 MIL/uL (ref 3.87–5.11)
RDW: 14.1 % (ref 11.5–15.5)
WBC: 6.6 10*3/uL (ref 4.0–10.5)

## 2017-10-28 LAB — LIPID PANEL
CHOL/HDL RATIO: 2.5 ratio
Cholesterol: 178 mg/dL (ref 0–200)
HDL: 71 mg/dL (ref >40)
LDL CALC: 99 mg/dL (ref 0–99)
TRIGLYCERIDES: 41 mg/dL (ref <150)
VLDL: 8 mg/dL (ref 0–40)

## 2017-10-28 LAB — TROPONIN I
Troponin I: <0.03 ng/mL (ref <0.03)
Troponin I: <0.03 ng/mL (ref <0.03)

## 2017-10-28 LAB — APTT: aPTT: 30 seconds (ref 24–36)

## 2017-10-28 LAB — HEPARIN LEVEL (UNFRACTIONATED): Heparin Unfractionated: 1.28 IU/mL — ABNORMAL HIGH (ref 0.30–0.70)

## 2017-10-28 SURGERY — LEFT HEART CATH AND CORONARY ANGIOGRAPHY
Anesthesia: LOCAL

## 2017-10-28 MED ORDER — HEPARIN SODIUM (PORCINE) 1000 UNIT/ML IJ SOLN
INTRAMUSCULAR | Status: DC | PRN
  Administered 2017-10-28: 3000 [IU] via INTRAVENOUS

## 2017-10-28 MED ORDER — LEVOTHYROXINE SODIUM 25 MCG PO TABS
25.0000 ug | ORAL_TABLET | Freq: Every day | ORAL | Status: DC
  Administered 2017-10-28 – 2017-10-29 (×2): 25 ug via ORAL
  Filled 2017-10-28 (×2): qty 1

## 2017-10-28 MED ORDER — MIDAZOLAM HCL 2 MG/2ML IJ SOLN
INTRAMUSCULAR | Status: AC
  Filled 2017-10-28: qty 2

## 2017-10-28 MED ORDER — VERAPAMIL HCL 2.5 MG/ML IV SOLN
INTRAVENOUS | Status: AC
  Filled 2017-10-28: qty 2

## 2017-10-28 MED ORDER — FENTANYL CITRATE (PF) 100 MCG/2ML IJ SOLN
INTRAMUSCULAR | Status: AC
  Filled 2017-10-28: qty 2

## 2017-10-28 MED ORDER — ONDANSETRON HCL 4 MG/2ML IJ SOLN: 4.0000 mg | Freq: Four times a day (QID) | INTRAMUSCULAR | Status: DC | PRN

## 2017-10-28 MED ORDER — SPIRONOLACTONE 25 MG PO TABS
25.0000 mg | ORAL_TABLET | Freq: Every day | ORAL | Status: DC
  Filled 2017-10-28: qty 1

## 2017-10-28 MED ORDER — SODIUM CHLORIDE 0.9 % IV SOLN: 250.0000 mL | INTRAVENOUS | Status: DC | PRN

## 2017-10-28 MED ORDER — NITROGLYCERIN 1 MG/10 ML FOR IR/CATH LAB
INTRA_ARTERIAL | Status: AC
  Filled 2017-10-28: qty 10

## 2017-10-28 MED ORDER — HEPARIN SODIUM (PORCINE) 1000 UNIT/ML IJ SOLN
INTRAMUSCULAR | Status: AC
  Filled 2017-10-28: qty 1

## 2017-10-28 MED ORDER — VERAPAMIL HCL 2.5 MG/ML IV SOLN
INTRA_ARTERIAL | Status: DC | PRN
  Administered 2017-10-28: 2 mL via INTRA_ARTERIAL
  Administered 2017-10-28: 4 mL via INTRA_ARTERIAL
  Administered 2017-10-28: 7.5 mL via INTRA_ARTERIAL

## 2017-10-28 MED ORDER — ACETAMINOPHEN 325 MG PO TABS: 650.0000 mg | ORAL_TABLET | ORAL | Status: DC | PRN

## 2017-10-28 MED ORDER — ASPIRIN 300 MG RE SUPP: 300.0000 mg | RECTAL | Status: AC

## 2017-10-28 MED ORDER — PROSIGHT PO TABS: 1.0000 | ORAL_TABLET | Freq: Every day | ORAL | Status: DC

## 2017-10-28 MED ORDER — SODIUM CHLORIDE 0.9% FLUSH: 3.0000 mL | Freq: Two times a day (BID) | INTRAVENOUS | Status: DC

## 2017-10-28 MED ORDER — NITROGLYCERIN 0.4 MG SL SUBL: 0.4000 mg | SUBLINGUAL_TABLET | SUBLINGUAL | Status: DC | PRN

## 2017-10-28 MED ORDER — ASPIRIN 81 MG PO CHEW
81.0000 mg | CHEWABLE_TABLET | Freq: Every day | ORAL | Status: DC
  Filled 2017-10-28: qty 1

## 2017-10-28 MED ORDER — IRBESARTAN 75 MG PO TABS
150.0000 mg | ORAL_TABLET | Freq: Every day | ORAL | Status: DC
  Administered 2017-10-28 – 2017-10-29 (×2): 150 mg via ORAL
  Filled 2017-10-28 (×2): qty 2
  Filled 2017-10-28: qty 1

## 2017-10-28 MED ORDER — LABETALOL HCL 300 MG PO TABS
300.0000 mg | ORAL_TABLET | Freq: Two times a day (BID) | ORAL | Status: DC
  Administered 2017-10-28 – 2017-10-29 (×3): 300 mg via ORAL
  Filled 2017-10-28: qty 1
  Filled 2017-10-28: qty 2
  Filled 2017-10-28 (×2): qty 1

## 2017-10-28 MED ORDER — ASPIRIN EC 81 MG PO TBEC: 81.0000 mg | DELAYED_RELEASE_TABLET | Freq: Every day | ORAL | Status: DC

## 2017-10-28 MED ORDER — DILTIAZEM HCL ER COATED BEADS 240 MG PO CP24
240.0000 mg | ORAL_CAPSULE | Freq: Every day | ORAL | Status: DC
  Administered 2017-10-28 – 2017-10-29 (×2): 240 mg via ORAL
  Filled 2017-10-28 (×2): qty 1

## 2017-10-28 MED ORDER — SODIUM CHLORIDE 0.9 % IV SOLN
INTRAVENOUS | Status: AC
  Administered 2017-10-28: 17:00:00 via INTRAVENOUS

## 2017-10-28 MED ORDER — IOPAMIDOL (ISOVUE-370) INJECTION 76%
INTRAVENOUS | Status: AC
  Filled 2017-10-28: qty 100

## 2017-10-28 MED ORDER — LIDOCAINE HCL (PF) 1 % IJ SOLN
INTRAMUSCULAR | Status: AC
  Filled 2017-10-28: qty 30

## 2017-10-28 MED ORDER — HEPARIN (PORCINE) IN NACL 2-0.9 UNIT/ML-% IJ SOLN
INTRAMUSCULAR | Status: AC
  Filled 2017-10-28: qty 1000

## 2017-10-28 MED ORDER — ASPIRIN 81 MG PO CHEW
324.0000 mg | CHEWABLE_TABLET | ORAL | Status: AC
  Administered 2017-10-28: 324 mg via ORAL
  Filled 2017-10-28: qty 4

## 2017-10-28 MED ORDER — HEPARIN (PORCINE) IN NACL 100-0.45 UNIT/ML-% IJ SOLN
1150.0000 [IU]/h | INTRAMUSCULAR | Status: DC
  Administered 2017-10-28: 13:00:00 1150 [IU]/h via INTRAVENOUS
  Filled 2017-10-28: qty 250

## 2017-10-28 MED ORDER — HEPARIN SODIUM (PORCINE) 5000 UNIT/ML IJ SOLN: 5000.0000 [IU] | Freq: Three times a day (TID) | INTRAMUSCULAR | Status: DC

## 2017-10-28 MED ORDER — LIDOCAINE HCL (PF) 1 % IJ SOLN
INTRAMUSCULAR | Status: DC | PRN
  Administered 2017-10-28: 1 mL

## 2017-10-28 MED ORDER — IOPAMIDOL (ISOVUE-370) INJECTION 76%
INTRAVENOUS | Status: DC | PRN
  Administered 2017-10-28: 65 mL via INTRAVENOUS

## 2017-10-28 MED ORDER — HEPARIN (PORCINE) IN NACL 2-0.9 UNIT/ML-% IJ SOLN
INTRAMUSCULAR | Status: AC | PRN
  Administered 2017-10-28 (×2): 500 mL

## 2017-10-28 MED ORDER — SODIUM CHLORIDE 0.9% FLUSH: 3.0000 mL | INTRAVENOUS | Status: DC | PRN

## 2017-10-28 SURGICAL SUPPLY — 12 items
BAND ZEPHYR COMPRESS 30 LONG (HEMOSTASIS) ×2 IMPLANT
CATH INFINITI 5FR ANG PIGTAIL (CATHETERS) ×2 IMPLANT
CATH OPTITORQUE TIG 4.0 5F (CATHETERS) ×2 IMPLANT
GLIDESHEATH SLEND A-KIT 6F 22G (SHEATH) ×2 IMPLANT
GUIDEWIRE INQWIRE 1.5J.035X260 (WIRE) ×1 IMPLANT
INQWIRE 1.5J .035X260CM (WIRE) ×2
KIT HEART LEFT (KITS) ×2 IMPLANT
PACK CARDIAC CATHETERIZATION (CUSTOM PROCEDURE TRAY) ×2 IMPLANT
SYR MEDRAD MARK V 150ML (SYRINGE) ×2 IMPLANT
TRANSDUCER W/STOPCOCK (MISCELLANEOUS) ×2 IMPLANT
TUBING CIL FLEX 10 FLL-RA (TUBING) ×2 IMPLANT
WIRE HI TORQ VERSACORE-J 145CM (WIRE) ×2 IMPLANT

## 2017-10-28 NOTE — Progress Notes (Addendum)
Progress Note  Patient Name: [name] [name] Date of Encounter: 10/28/2017  Primary Cardiologist: Virl Axe, MD  Subjective   Feeling well this morning. No chest pain/pressure.   Inpatient Medications    Scheduled Meds: . aspirin  81 mg Oral Pre-Cath  . [START ON 10/29/2017] aspirin EC  81 mg Oral Daily  . diltiazem  240 mg Oral Daily  . heparin  5,000 Units Subcutaneous Q8H  . irbesartan  150 mg Oral Daily  . labetalol  300 mg Oral BID  . levothyroxine  25 mcg Oral QAC breakfast  . multivitamin  1 tablet Oral QHS  . sodium chloride flush  3 mL Intravenous Q12H  . spironolactone  25 mg Oral Daily   Continuous Infusions: . sodium chloride    . sodium chloride 1 mL/kg/hr (10/27/17 2229)   PRN Meds: sodium chloride, acetaminophen, nitroGLYCERIN, ondansetron (ZOFRAN) IV, sodium chloride flush   Vital Signs    Vitals:   10/28/17 0530 10/28/17 0600 10/28/17 0700 10/28/17 0733  BP: 129/63 (!) 138/59 138/70 121/71  Pulse: 77   80  Resp: (!) 21 16 16 17   Temp: (!) 97.5 F (36.4 C)   (!) 97.5 F (36.4 C)  TempSrc: Oral   Oral  SpO2: 98% 100% 98% 100%  Weight: 161 lb 13.1 oz (73.4 kg)     Height: 5' 6"  (1.676 m)       Intake/Output Summary (Last 24 hours) at 10/28/2017 0845 Last data filed at 10/28/2017 0700 Gross per 24 hour  Intake 637.05 ml  Output 300 ml  Net 337.05 ml   Filed Weights   10/27/17 1621 10/28/17 0530  Weight: 165 lb (74.8 kg) 161 lb 13.1 oz (73.4 kg)    Telemetry    Afib rate controlled, vpaced at times - Personally Reviewed  ECG    Afib - Personally Reviewed  Physical Exam   General: Well developed, well nourished, female appearing in no acute distress. Head: Normocephalic, atraumatic.  Neck: Supple without bruits, JVD. Lungs:  Resp regular and unlabored, CTA. Heart: Irreg Irreg, S1, S2, no S3, S4, or murmur; no rub. Abdomen: Soft, non-tender, non-distended with normoactive bowel sounds.  Extremities: No clubbing, cyanosis, edema.  Distal pedal pulses are 2+ bilaterally. Neuro: Alert and oriented X 3. Moves all extremities spontaneously. Psych: Normal affect.  Labs    Chemistry Recent Labs  Lab 10/27/17 1706 10/27/17 1921  NA 138 137  K 4.4 4.1  CL 108 108  CO2 20* 20*  GLUCOSE 95 84  BUN 17 16  CREATININE 1.15* 1.04*  CALCIUM 9.4 9.3  PROT 6.4*  --   ALBUMIN 3.5  --   AST 19  --   ALT 14  --   ALKPHOS 72  --   BILITOT 1.0  --   GFRNONAA 42* 48*  GFRAA 49* 56*  ANIONGAP 10 9     Hematology Recent Labs  Lab 10/27/17 1706 10/27/17 1921 10/28/17 0700  WBC 7.0 6.3 6.6  RBC 4.25 4.18 4.00  HGB 13.7 13.6 13.1  HCT 41.0 40.3 38.9  MCV 96.5 96.4 97.3  MCH 32.2 32.5 32.8  MCHC 33.4 33.7 33.7  RDW 14.1 14.1 14.1  PLT 224 221 203    Cardiac Enzymes Recent Labs  Lab 10/27/17 1757 10/28/17 0120 10/28/17 0701  TROPONINI <0.03 <0.03 <0.03    Recent Labs  Lab 10/27/17 1712  TROPIPOC 0.01     BNPNo results for input(s): BNP, PROBNP in the last 168 hours.  DDimer No results for input(s): DDIMER in the last 168 hours.    Radiology    Dg Chest 2 View  Result Date: 10/27/2017 CLINICAL DATA:  84 year old female with heaviness over the upper back radiating down both arms EXAM: CHEST - 2 VIEW COMPARISON:  Prior chest x-ray 05/22/2016; prior chest CT 07/22/2017 FINDINGS: Cardiac and mediastinal contours remain unchanged. Stable position of left subclavian approach cardiac rhythm maintenance device with leads projecting over the right atrium and right ventricle. Atherosclerotic calcifications are present in the transverse aorta. Stable chronic bronchitic changes. No new airspace opacity, pulmonary edema, pleural effusion or pneumothorax. The bones appear demineralized but there is no acute osseous abnormality. IMPRESSION: No active cardiopulmonary disease. Aortic Atherosclerosis (ICD10-170.0) Electronically Signed   By: Jacqulynn Cadet M.D.   On: 10/27/2017 17:50    Cardiac Studies    N/a  Patient Profile     84 y.o. female with PMH of chronic Afib who presented with chest pain/pressure. Admitted with plans for cath.   Assessment & Plan    1. Chest pain: Symptoms are concerning for angina. Trop neg x3 thus far. Planned for cath later this afternoon. Last dose of Eliquis was yesterday morning around 10am. No chest pain overnight.  -- The patient understands that risks included but are not limited to stroke (1 in 1000), death (1 in 21), kidney failure [usually temporary] (1 in 500), bleeding (1 in 200), allergic reaction [possibly serious] (1 in 200).   2. Chronic Afib: rate is controlled. Eliquis on hold for cath.   3. HTN: stable on current therapy  4. Thoracic aortic aneurysm: followed by Dr. Servando Snare as an oupatient.    Signed, Reino Bellis, NP  10/28/2017, 8:45 AM  Pager # [phone]   For questions or updates, please contact [place] Please consult [email or web address] for contact info under Cardiology/STEMI.    Agree with note by Reino Bellis NP-C  Pt without Sx overnight. Enz neg. Labs OK. Last Eliquis dose 10 am yesterday. Will start IV hep until cath later this afternoon.  Lorretta Harp, M.D., [place], [facility], [place] [address] [place], [place]  [address]  [phone] 10/28/2017 9:54 AM

## 2017-10-28 NOTE — Progress Notes (Signed)
[facility] BAND REMOVAL  LOCATION:    right radial  DEFLATED PER PROTOCOL:    Yes.    TIME BAND OFF / DRESSING APPLIED:    20:30   SITE UPON ARRIVAL:    Level 0  SITE AFTER BAND REMOVAL:    Level 0  CIRCULATION SENSATION AND MOVEMENT:    Within Normal Limits   Yes.    COMMENTS:   Post TR band instructions given. Pt tolerated well.

## 2017-10-28 NOTE — H&P (View-Only) (Signed)
Progress Note  Patient Name: MARSENA TAFF Date of Encounter: 10/28/2017  Primary Cardiologist: Virl Axe, MD  Subjective   Feeling well this morning. No chest pain/pressure.   Inpatient Medications    Scheduled Meds: . aspirin  81 mg Oral Pre-Cath  . [START ON 10/29/2017] aspirin EC  81 mg Oral Daily  . diltiazem  240 mg Oral Daily  . heparin  5,000 Units Subcutaneous Q8H  . irbesartan  150 mg Oral Daily  . labetalol  300 mg Oral BID  . levothyroxine  25 mcg Oral QAC breakfast  . multivitamin  1 tablet Oral QHS  . sodium chloride flush  3 mL Intravenous Q12H  . spironolactone  25 mg Oral Daily   Continuous Infusions: . sodium chloride    . sodium chloride 1 mL/kg/hr (10/27/17 2229)   PRN Meds: sodium chloride, acetaminophen, nitroGLYCERIN, ondansetron (ZOFRAN) IV, sodium chloride flush   Vital Signs    Vitals:   10/28/17 0530 10/28/17 0600 10/28/17 0700 10/28/17 0733  BP: 129/63 (!) 138/59 138/70 121/71  Pulse: 77   80  Resp: (!) 21 16 16 17   Temp: (!) 97.5 F (36.4 C)   (!) 97.5 F (36.4 C)  TempSrc: Oral   Oral  SpO2: 98% 100% 98% 100%  Weight: 161 lb 13.1 oz (73.4 kg)     Height: 5' 6"  (1.676 m)       Intake/Output Summary (Last 24 hours) at 10/28/2017 0845 Last data filed at 10/28/2017 0700 Gross per 24 hour  Intake 637.05 ml  Output 300 ml  Net 337.05 ml   Filed Weights   10/27/17 1621 10/28/17 0530  Weight: 165 lb (74.8 kg) 161 lb 13.1 oz (73.4 kg)    Telemetry    Afib rate controlled, vpaced at times - Personally Reviewed  ECG    Afib - Personally Reviewed  Physical Exam   General: Well developed, well nourished, female appearing in no acute distress. Head: Normocephalic, atraumatic.  Neck: Supple without bruits, JVD. Lungs:  Resp regular and unlabored, CTA. Heart: Irreg Irreg, S1, S2, no S3, S4, or murmur; no rub. Abdomen: Soft, non-tender, non-distended with normoactive bowel sounds.  Extremities: No clubbing, cyanosis, edema.  Distal pedal pulses are 2+ bilaterally. Neuro: Alert and oriented X 3. Moves all extremities spontaneously. Psych: Normal affect.  Labs    Chemistry Recent Labs  Lab 10/27/17 1706 10/27/17 1921  NA 138 137  K 4.4 4.1  CL 108 108  CO2 20* 20*  GLUCOSE 95 84  BUN 17 16  CREATININE 1.15* 1.04*  CALCIUM 9.4 9.3  PROT 6.4*  --   ALBUMIN 3.5  --   AST 19  --   ALT 14  --   ALKPHOS 72  --   BILITOT 1.0  --   GFRNONAA 42* 48*  GFRAA 49* 56*  ANIONGAP 10 9     Hematology Recent Labs  Lab 10/27/17 1706 10/27/17 1921 10/28/17 0700  WBC 7.0 6.3 6.6  RBC 4.25 4.18 4.00  HGB 13.7 13.6 13.1  HCT 41.0 40.3 38.9  MCV 96.5 96.4 97.3  MCH 32.2 32.5 32.8  MCHC 33.4 33.7 33.7  RDW 14.1 14.1 14.1  PLT 224 221 203    Cardiac Enzymes Recent Labs  Lab 10/27/17 1757 10/28/17 0120 10/28/17 0701  TROPONINI <0.03 <0.03 <0.03    Recent Labs  Lab 10/27/17 1712  TROPIPOC 0.01     BNPNo results for input(s): BNP, PROBNP in the last 168 hours.  DDimer No results for input(s): DDIMER in the last 168 hours.    Radiology    Dg Chest 2 View  Result Date: 10/27/2017 CLINICAL DATA:  84 year old female with heaviness over the upper back radiating down both arms EXAM: CHEST - 2 VIEW COMPARISON:  Prior chest x-ray 05/22/2016; prior chest CT 07/22/2017 FINDINGS: Cardiac and mediastinal contours remain unchanged. Stable position of left subclavian approach cardiac rhythm maintenance device with leads projecting over the right atrium and right ventricle. Atherosclerotic calcifications are present in the transverse aorta. Stable chronic bronchitic changes. No new airspace opacity, pulmonary edema, pleural effusion or pneumothorax. The bones appear demineralized but there is no acute osseous abnormality. IMPRESSION: No active cardiopulmonary disease. Aortic Atherosclerosis (ICD10-170.0) Electronically Signed   By: Jacqulynn Cadet M.D.   On: 10/27/2017 17:50    Cardiac Studies    N/a  Patient Profile     84 y.o. female with PMH of chronic Afib who presented with chest pain/pressure. Admitted with plans for cath.   Assessment & Plan    1. Chest pain: Symptoms are concerning for angina. Trop neg x3 thus far. Planned for cath later this afternoon. Last dose of Eliquis was yesterday morning around 10am. No chest pain overnight.  -- The patient understands that risks included but are not limited to stroke (1 in 1000), death (1 in 45), kidney failure [usually temporary] (1 in 500), bleeding (1 in 200), allergic reaction [possibly serious] (1 in 200).   2. Chronic Afib: rate is controlled. Eliquis on hold for cath.   3. HTN: stable on current therapy  4. Thoracic aortic aneurysm: followed by Dr. Servando Snare as an oupatient.    Signed, Reino Bellis, NP  10/28/2017, 8:45 AM  Pager # [phone]   For questions or updates, please contact [place] Please consult [email or web address] for contact info under Cardiology/STEMI.    Agree with note by Reino Bellis NP-C  Pt without Sx overnight. Enz neg. Labs OK. Last Eliquis dose 10 am yesterday. Will start IV hep until cath later this afternoon.  Lorretta Harp, M.D., [place], [facility], [place] [address] [place], [place]  [address]  [phone] 10/28/2017 9:54 AM

## 2017-10-28 NOTE — Progress Notes (Signed)
ANTICOAGULATION CONSULT NOTE - Initial Consult  Pharmacy Consult for Heparin Indication: h/o  atrial fibrillation  Allergies  Allergen Reactions  . Penicillins Anaphylaxis    Has patient had a PCN reaction causing immediate rash, facial/tongue/throat swelling, SOB or lightheadedness with hypotension: Yes Has patient had a PCN reaction causing severe rash involving mucus membranes or skin necrosis: No Has patient had a PCN reaction that required hospitalization No Has patient had a PCN reaction occurring within the last 10 years: No If all of the above answers are "NO", then may proceed with Cephalosporin use.   . Statins Other (See Comments)    REACTION: elevated LFT's  . Tylenol [Acetaminophen] Other (See Comments)    If taken with Vytorin at risk for liver damage  . Amiodarone Other (See Comments)  . Tape Other (See Comments)    Redness, Please use "paper" tape only.  . Codeine Other (See Comments)    feel funny, head is fuzzy    Patient Measurements: Height: 5' 6"  (167.6 cm) Weight: 161 lb 13.1 oz (73.4 kg) IBW/kg (Calculated) : 59.3 Heparin Dosing Weight: 73.4 kg  Vital Signs: Temp: 97.5 F (36.4 C) (04/16 0733) Temp Source: Oral (04/16 0733) BP: 121/71 (04/16 0733) Pulse Rate: 80 (04/16 0733)  Labs: Recent Labs    10/27/17 1706 10/27/17 1757 10/27/17 1921 10/28/17 0120 10/28/17 0700 10/28/17 0701  HGB 13.7  --  13.6  --  13.1  --   HCT 41.0  --  40.3  --  38.9  --   PLT 224  --  221  --  203  --   LABPROT 17.2*  --  16.0*  --   --   --   INR 1.42  --  1.29  --   --   --   CREATININE 1.15*  --  1.04*  --  0.95  --   TROPONINI  --  <0.03  --  <0.03  --  <0.03    Estimated Creatinine Clearance: 45.2 mL/min (by C-G formula based on SCr of 0.95 mg/dL).   Medical History: Past Medical History:  Diagnosis Date  . Ascending aorta enlargement (HCC)    4.8 cm per echo 08/07/11; 5.0 by CT in Jan 2013  . CAD (coronary artery disease)    LHC 5/08:  pOM 20%,  pRCA 20-30%, EF 60%  . Hemorrhoids    current problem as of 04/26/16  with slight bleeding of hemorrhoids   . Hepatitis    hx of medication induced hepatitis (Vytorin per pt)  . History of kidney stones   . History of loop recorder   . HLD (hyperlipidemia)   . HTN (hypertension)   . Hx of echocardiogram    echo 1/13: EF 55%, Gr 2 diast dysfn, Asc Ao aneurysm 4.8 cm, mild MR, mod LAE, mild RAE  . Hypothyroidism   . ICH (intracerebral hemorrhage) ([place]) 03/24/15  . NASH (nonalcoholic steatohepatitis)   . Neuromuscular disorder ([place])   . Pacemaker-Medtronic 11/12/2011   Implanted 2013   . Persistent atrial fibrillation ([place])   . PMR (polymyalgia rheumatica) (HCC)    no steriods for 5 years  . sinus node dysfunction//post termination pauses   . Stroke ([place])   . Urinary tract infection    Recurrent infections.     Medications: PTA  Apixaban 5 mg po bid , last pta dose taken on 10/27/17 @0900 . See PTA medication list for full list of medications.  Assessment: 84 y.o female with h/o persistant atrial fibrillation,  on Apixaban PTA. She [facility] to [facility]17 with chest pain/pressure. Admitted with plans for cath.  Apixaban on hold for cath. Last Apixaban dose taken 4/15 @ 0900AM.  To start IV heparin infusion now. Will monitor heparin infusion by aPTTs due to recent apixaban effect on heparin levels.  CBC wnl. SCr , CrCl ~ 45 ml/min Plan for cath later this afternoon.    Goal of Therapy:  APTT 66-102 seconds Heparin level 0.3-0.7 units/ml Monitor platelets by anticoagulation protocol: Yes   Plan:  STAT heparin level and aPTT prior to start of IV heparin.  Start IVHeparin drip at 1150 units/hr  Check 8 hour aPTT (if does not go to cath this afternoon)  Daily aPTT, Heparin level and CBC F/u after cath later this afternoon.    Nicole Cella, RPh Clinical Pharmacist Pager: [phone] 8A-4P [phone] 4P-10P [phone] or [phone] [place] [phone] 10/28/2017,10:04 AM

## 2017-10-28 NOTE — Interval H&P Note (Signed)
Cath Lab Visit (complete for each Cath Lab visit)  Clinical Evaluation Leading to the Procedure:   ACS: No.  Non-ACS:    Anginal Classification: CCS III  Anti-ischemic medical therapy: No Therapy  Non-Invasive Test Results: No non-invasive testing performed  Prior CABG: No previous CABG      History and Physical Interval Note:  10/28/2017 3:44 PM  [name] [name]  has presented today for surgery, with the diagnosis of cp  The various methods of treatment have been discussed with the patient and family. After consideration of risks, benefits and other options for treatment, the patient has consented to  Procedure(s): LEFT HEART CATH AND CORONARY ANGIOGRAPHY (N/A) as a surgical intervention .  The patient's history has been reviewed, patient examined, no change in status, stable for surgery.  I have reviewed the patient's chart and labs.  Questions were answered to the patient's satisfaction.     Quay Burow

## 2017-10-29 ENCOUNTER — Encounter (HOSPITAL_COMMUNITY): Payer: Self-pay | Admitting: Cardiovascular Disease

## 2017-10-29 DIAGNOSIS — I208 Other forms of angina pectoris: Secondary | ICD-10-CM | POA: Diagnosis not present

## 2017-10-29 DIAGNOSIS — I200 Unstable angina: Secondary | ICD-10-CM | POA: Diagnosis not present

## 2017-10-29 DIAGNOSIS — I25110 Atherosclerotic heart disease of native coronary artery with unstable angina pectoris: Secondary | ICD-10-CM | POA: Diagnosis not present

## 2017-10-29 DIAGNOSIS — I480 Paroxysmal atrial fibrillation: Secondary | ICD-10-CM | POA: Diagnosis not present

## 2017-10-29 LAB — BASIC METABOLIC PANEL
Anion gap: 7 (ref 5–15)
BUN: 13 mg/dL (ref 6–20)
CALCIUM: 8.9 mg/dL (ref 8.9–10.3)
CO2: 21 mmol/L — ABNORMAL LOW (ref 22–32)
CREATININE: 0.79 mg/dL (ref 0.44–1.00)
Chloride: 114 mmol/L — ABNORMAL HIGH (ref 101–111)
GFR calc Af Amer: >60 mL/min (ref >60)
GLUCOSE: 82 mg/dL (ref 65–99)
Potassium: 4 mmol/L (ref 3.5–5.1)
SODIUM: 142 mmol/L (ref 135–145)

## 2017-10-29 LAB — CBC
HCT: 38 % (ref 36.0–46.0)
Hemoglobin: 12.4 g/dL (ref 12.0–15.0)
MCH: 31.8 pg (ref 26.0–34.0)
MCHC: 32.6 g/dL (ref 30.0–36.0)
MCV: 97.4 fL (ref 78.0–100.0)
PLATELETS: 195 10*3/uL (ref 150–400)
RBC: 3.9 MIL/uL (ref 3.87–5.11)
RDW: 14.3 % (ref 11.5–15.5)
WBC: 6.7 10*3/uL (ref 4.0–10.5)

## 2017-10-29 MED ORDER — APIXABAN 5 MG PO TABS
5.0000 mg | ORAL_TABLET | Freq: Two times a day (BID) | ORAL | Status: DC
  Administered 2017-10-29: 5 mg via ORAL
  Filled 2017-10-29: qty 1

## 2017-10-29 MED FILL — Heparin Sod (Porcine)-NaCl IV Soln 1000 Unit/500ML-0.9%: INTRAVENOUS | Qty: 1000 | Status: AC

## 2017-10-29 NOTE — Discharge Summary (Addendum)
Discharge Summary    Patient ID: [name] [name],  MRN: [id number], DOB/AGE: 10/15/1930 84 y.o.  Admit date: 10/27/2017 Discharge date: 10/29/2017  Primary Care Provider: [name] Primary Cardiologist: Dr. Caryl Comes   Discharge Diagnoses    Active Problems:   Unstable angina [facility])   Paroxysmal atrial fibrillation (HCC)   Anginal equivalent (HCC)   Allergies Allergies  Allergen Reactions  . Penicillins Anaphylaxis    Has patient had a PCN reaction causing immediate rash, facial/tongue/throat swelling, SOB or lightheadedness with hypotension: Yes Has patient had a PCN reaction causing severe rash involving mucus membranes or skin necrosis: No Has patient had a PCN reaction that required hospitalization No Has patient had a PCN reaction occurring within the last 10 years: No If all of the above answers are "NO", then may proceed with Cephalosporin use.   . Statins Other (See Comments)    REACTION: elevated LFT's  . Tylenol [Acetaminophen] Other (See Comments)    If taken with Vytorin at risk for liver damage  . Amiodarone Other (See Comments)  . Tape Other (See Comments)    Redness, Please use "paper" tape only.  . Codeine Other (See Comments)    feel funny, head is fuzzy    Diagnostic Studies/Procedures    Cath: 10/28/17  Conclusion     Prox RCA to Mid RCA lesion is 30% stenosed.  The left ventricular systolic function is normal.  LV end diastolic pressure is normal.  The left ventricular ejection fraction is 55-65% by visual estimate.  _____________   History of Present Illness     [name] [name] is a 84 year old female who is followed by Dr. Caryl Comes for chronic A. fib status post permanent transvenous pacemaker in mentation remotely. She has a history of permanent A. fib rate controlled on Eliquis oral anticoagulation. She does have treated hypertension and a thoracic aortic aneurysm which has been followed by Dr. Roxy Horseman for the last 7 years and has remained  stable. She had a left heart cath 5/08 revealing noncritical CAD. LV function is normal. She described back and arm pain intermittently over the last 4-5 months which was becoming more frequent and severe over the last several days now occurring daily for the last 3-4 days. She denied chest pain. There was no associated shortness of breath. Given her symptoms she presented to the ED and was admitted with plans for cath.   Hospital Course      Underwent cath noted above with Dr. Gwenlyn Found with normal coronaries. Reported feeling well the following morning. Troponins remains neg x3. Able to ambulate without recurrent symptoms. Cath site stable the following morning and was resumed on her Eliquis.   General: Well developed, well nourished, female appearing in no acute distress. Head: Normocephalic, atraumatic.  Neck: Supple without bruits, JVD. Lungs:  Resp regular and unlabored, CTA. Heart: Irreg Irreg, S1, S2, no murmur; no rub. Abdomen: Soft, non-tender, non-distended with normoactive bowel sounds. Extremities: No clubbing, cyanosis, edema. Distal pedal pulses are 2+ bilaterally. R radial cath site stable without bruising or hematoma Neuro: Alert and oriented X 3. Moves all extremities spontaneously. Psych: Normal affect.  [name] [name] was seen by Dr. Gwenlyn Found and determined stable for discharge home. Follow up in the office has been arranged. Medications are listed below.   _____________  Discharge Vitals Blood pressure 121/65, pulse 66, temperature (!) 97 F (36.1 C), temperature source Oral, resp. rate 15, height 5' 6"  (1.676 m), weight 160 lb 15 oz (73  kg), SpO2 98 %.  Filed Weights   10/27/17 1621 10/28/17 0530 10/29/17 0344  Weight: 165 lb (74.8 kg) 161 lb 13.1 oz (73.4 kg) 160 lb 15 oz (73 kg)    Labs & Radiologic Studies    CBC Recent Labs    10/28/17 0700 10/29/17 0310  WBC 6.6 6.7  HGB 13.1 12.4  HCT 38.9 38.0  MCV 97.3 97.4  PLT 203 127   Basic Metabolic Panel Recent  Labs    10/28/17 0700 10/29/17 0310  NA 138 142  K 3.9 4.0  CL 108 114*  CO2 19* 21*  GLUCOSE 84 82  BUN 16 13  CREATININE 0.95 0.79  CALCIUM 8.9 8.9   Liver Function Tests Recent Labs    10/27/17 1706  AST 19  ALT 14  ALKPHOS 72  BILITOT 1.0  PROT 6.4*  ALBUMIN 3.5   Recent Labs    10/27/17 1706  LIPASE 39   Cardiac Enzymes Recent Labs    10/27/17 1757 10/28/17 0120 10/28/17 0701  TROPONINI <0.03 <0.03 <0.03   BNP Invalid input(s): POCBNP D-Dimer No results for input(s): DDIMER in the last 72 hours. Hemoglobin A1C No results for input(s): HGBA1C in the last 72 hours. Fasting Lipid Panel Recent Labs    10/28/17 0700  CHOL 178  HDL 71  LDLCALC 99  TRIG 41  CHOLHDL 2.5   Thyroid Function Tests No results for input(s): TSH, T4TOTAL, T3FREE, THYROIDAB in the last 72 hours.  Invalid input(s): FREET3 _____________  Dg Chest 2 View  Result Date: 10/27/2017 CLINICAL DATA:  84 year old female with heaviness over the upper back radiating down both arms EXAM: CHEST - 2 VIEW COMPARISON:  Prior chest x-ray 05/22/2016; prior chest CT 07/22/2017 FINDINGS: Cardiac and mediastinal contours remain unchanged. Stable position of left subclavian approach cardiac rhythm maintenance device with leads projecting over the right atrium and right ventricle. Atherosclerotic calcifications are present in the transverse aorta. Stable chronic bronchitic changes. No new airspace opacity, pulmonary edema, pleural effusion or pneumothorax. The bones appear demineralized but there is no acute osseous abnormality. IMPRESSION: No active cardiopulmonary disease. Aortic Atherosclerosis (ICD10-170.0) Electronically Signed   By: Jacqulynn Cadet M.D.   On: 10/27/2017 17:50   Disposition   Pt is being discharged home today in good condition.  Follow-up Plans & Appointments    Follow-up Information    [name], MD Follow up.   Specialty:  Cardiology Why:  Office will call you  with a follow up appt.  Contact information: [address] [place] [address] [phone]          Discharge Instructions    Call MD for:  redness, tenderness, or signs of infection (pain, swelling, redness, odor or green/yellow discharge around incision site)   Complete by:  As directed    Diet - low sodium heart healthy   Complete by:  As directed    Discharge instructions   Complete by:  As directed    Radial Site Care Refer to this sheet in the next few weeks. These instructions provide you with information on caring for yourself after your procedure. Your caregiver may also give you more specific instructions. Your treatment has been planned according to current medical practices, but problems sometimes occur. Call your caregiver if you have any problems or questions after your procedure. HOME CARE INSTRUCTIONS You may shower the day after the procedure.Remove the bandage (dressing) and gently wash the site with plain soap and water.Gently  pat the site dry.  Do not apply powder or lotion to the site.  Do not submerge the affected site in water for 3 to 5 days.  Inspect the site at least twice daily.  Do not flex or bend the affected arm for 24 hours.  No lifting over 5 pounds (2.3 kg) for 5 days after your procedure.  Do not drive home if you are discharged the same day of the procedure. Have someone else drive you.  You may drive 24 hours after the procedure unless otherwise instructed by your caregiver.  What to expect: Any bruising will usually fade within 1 to 2 weeks.  Blood that collects in the tissue (hematoma) may be painful to the touch. It should usually decrease in size and tenderness within 1 to 2 weeks.  SEEK IMMEDIATE MEDICAL CARE IF: You have unusual pain at the radial site.  You have redness, warmth, swelling, or pain at the radial site.  You have drainage (other than a small amount of blood on the dressing).  You have chills.  You have  a fever or persistent symptoms for more than 72 hours.  You have a fever and your symptoms suddenly get worse.  Your arm becomes pale, cool, tingly, or numb.  You have heavy bleeding from the site. Hold pressure on the site.   Increase activity slowly   Complete by:  As directed       Discharge Medications     Medication List    STOP taking these medications   valsartan 160 MG tablet Commonly known as:  DIOVAN     TAKE these medications   apixaban 5 MG Tabs tablet Commonly known as:  ELIQUIS Take 1 tablet (5 mg total) 2 (two) times daily by mouth.   CALTRATE 600+D PO Take 1 tablet by mouth daily.   cetirizine 10 MG tablet Commonly known as:  ZYRTEC Take 10 mg by mouth daily as needed for allergies.   cholecalciferol 1000 units tablet Commonly known as:  VITAMIN D Take 1,000 Units by mouth daily.   diltiazem 240 MG 24 hr capsule Commonly known as:  CARDIZEM CD TAKE 1 CAPSULE BY MOUTH DAILY What changed:    how much to take  how to take this  when to take this   furosemide 20 MG tablet Commonly known as:  LASIX TAKE 1 TABLET BY MOUTH DAILY What changed:    how much to take  how to take this  when to take this   irbesartan 150 MG tablet Commonly known as:  AVAPRO Take 1 tablet (150 mg total) by mouth daily. Please keep upcoming appt for future refills. Thank you   labetalol 300 MG tablet Commonly known as:  NORMODYNE TAKE 1 TABLET BY MOUTH TWICE DAILY What changed:    how much to take  how to take this  when to take this   levothyroxine 25 MCG tablet Commonly known as:  SYNTHROID, LEVOTHROID Take 25 mcg by mouth daily before breakfast.   multivitamin-lutein Caps capsule Take 1 capsule by mouth at bedtime.   potassium chloride 10 MEQ tablet Commonly known as:  K-DUR Take 1 tablet (10 mEq total) by mouth daily.   spironolactone 25 MG tablet Commonly known as:  ALDACTONE Take 25 mg by mouth daily.   WELCHOL 625 MG tablet Generic drug:   colesevelam Take 625 mg by mouth every morning and 1250 mg by mouth at bedtime.         Outstanding Labs/Studies  N/a   Duration of Discharge Encounter   Greater than 30 minutes including physician time.  Signed, Reino Bellis NP-C 10/29/2017, 8:59 AM  Agree with note by Reino Bellis NP-C  Cardiac catheterization performed by myself yesterday via the right radial approach showed essentially normal coronary arteries with only a mild 30% proximal RCA stenosis. Her LV function was normal. Her right radial puncture site it appears stable. She does have a paced rhythm. Will restart Eliquis as this morning. I do not think her symptoms are cardiac in nature and had reassured her of this. She is stable for discharge home this morning. We will arrange follow-up with Dr. Caryl Comes.  Lorretta Harp, M.D., [place], [facility], [place] [address] [place], [place]  [id number]  [phone] 10/29/2017 9:24 AM

## 2017-10-30 ENCOUNTER — Other Ambulatory Visit: Payer: Self-pay | Admitting: Internal Medicine

## 2017-11-11 ENCOUNTER — Telehealth: Payer: Self-pay | Admitting: Internal Medicine

## 2017-11-11 MED ORDER — POTASSIUM CHLORIDE ER 10 MEQ PO TBCR: 10.0000 meq | EXTENDED_RELEASE_TABLET | Freq: Two times a day (BID) | ORAL | 3 refills | Status: DC

## 2017-11-11 NOTE — Telephone Encounter (Signed)
Pt wanted to clarify her potassium dosage. She states she has been taking 18mq bid for "years." Pt also states she has not been taking Spironolactone and does not plan on taking it. It was ordered by her PCP. Pt's last K was 4.0 on 147m bid. Rx sent in for 1042mbid per pt request.

## 2017-11-11 NOTE — Telephone Encounter (Signed)
New Message    Pt c/o medication issue:  1. Name of Medication: potassium chloride (K-DUR) 10 MEQ tablet  2. How are you currently taking this medication (dosage and times per day)?   3. Are you having a reaction (difficulty breathing--STAT)?  4. What is your medication issue? Patient states that 2 prescriptions for the potassium chloride was sent but not what she normally takes. She wants to clarify what she should be taking. Please call

## 2017-11-11 NOTE — Telephone Encounter (Signed)
LVM for return call. 

## 2017-11-11 NOTE — Addendum Note (Signed)
Addended by: Dollene Primrose on: 11/11/2017 04:20 PM   Modules accepted: Orders

## 2017-11-11 NOTE — Telephone Encounter (Signed)
New message  Pt verbalized that she is returning call for RN

## 2017-11-24 ENCOUNTER — Ambulatory Visit: Payer: Medicare Other | Admitting: Internal Medicine

## 2017-11-24 ENCOUNTER — Encounter: Payer: Self-pay | Admitting: Internal Medicine

## 2017-11-24 ENCOUNTER — Encounter (INDEPENDENT_AMBULATORY_CARE_PROVIDER_SITE_OTHER): Payer: Self-pay

## 2017-11-24 VITALS — BP 140/88 | HR 84 | Ht 66.0 in | Wt 173.0 lb

## 2017-11-24 DIAGNOSIS — I481 Persistent atrial fibrillation: Secondary | ICD-10-CM | POA: Diagnosis not present

## 2017-11-24 DIAGNOSIS — Z950 Presence of cardiac pacemaker: Secondary | ICD-10-CM | POA: Diagnosis not present

## 2017-11-24 DIAGNOSIS — I4819 Other persistent atrial fibrillation: Secondary | ICD-10-CM

## 2017-11-24 DIAGNOSIS — I495 Sick sinus syndrome: Secondary | ICD-10-CM | POA: Diagnosis not present

## 2017-11-24 LAB — CUP PACEART INCLINIC DEVICE CHECK
Battery Impedance: 532 Ohm
Implantable Lead Implant Date: 20130114
Implantable Lead Model: 1948
Lead Channel Impedance Value: 747 Ohm
Lead Channel Pacing Threshold Amplitude: 0.5 V
Lead Channel Pacing Threshold Pulse Width: 0.4 ms
Lead Channel Sensing Intrinsic Amplitude: 22.4 mV
Lead Channel Setting Pacing Pulse Width: 0.4 ms
MDC IDC LEAD IMPLANT DT: 20130114
MDC IDC LEAD LOCATION: 753859
MDC IDC LEAD LOCATION: 753860
MDC IDC MSMT BATTERY REMAINING LONGEVITY: 99 mo
MDC IDC MSMT BATTERY VOLTAGE: 2.78 V
MDC IDC MSMT LEADCHNL RA IMPEDANCE VALUE: 67 Ohm
MDC IDC PG IMPLANT DT: 20130114
MDC IDC SESS DTM: 20190513130032
MDC IDC SET LEADCHNL RV PACING AMPLITUDE: 2.5 V
MDC IDC SET LEADCHNL RV SENSING SENSITIVITY: 5.6 mV
MDC IDC STAT BRADY RV PERCENT PACED: 43 %

## 2017-11-24 NOTE — Progress Notes (Signed)
Thank you so much     Patient Care Team: [name], MD as PCP - General (Internal Medicine) [name], MD as PCP - Cardiology (Cardiology) [name], MD (Cardiology)   HPI  [name] [name] is a 84 y.o. female  Seen in followup for atrial fibrillation in the setting of modest nonobstructive coronary disease and normal left ventricular function. She is status post pacemaker implantation for sinus node dysfunction and posttermination pauses. She's been treated with flecainide and subsequently with Rythmol and most recently amiodarone. This is been complicated by hyperthyroidism. It has been discontinued. Notably she is asymptomatic and her atrial fibrillation at this time.   1/17 TSH 2.01  7/17 TSH <0.01 8/17 TSH 0.11 10/17 TSH 13.9   She is now on synthroid and feeling much  better   She takes apixaban;  She has hx of L parietal ICH  Seen recently by neuro and felt to be stable     Saw Dr. Rayann Heman to consider catheter ablation- chose to continue amio  Now At Fib permanent    She was admitted 4/19 for chest pain.  She underwent catheterization demonstrating nonobstructive disease She continues with episodes of chest pain; however, knowing that her catheterization is nonobstructive she is putting out of her mind.  Her biggest concerns right now or peripheral edema.  This does not respond easily to furosemide 20 increase to 40.  She is on Cardizem.   She also has a thoracic aortic aneurysm followed by Dr. Everrett Coombe last note identifies increasing fragility elimination of the possibility of elective replacement of ascending aorta   Past Medical History:  Diagnosis Date  . Ascending aorta enlargement (HCC)    4.8 cm per echo 08/07/11; 5.0 by CT in Jan 2013  . CAD (coronary artery disease)    LHC 5/08:  pOM 20%, pRCA 20-30%, EF 60%  . Hemorrhoids    current problem as of 04/26/16  with slight bleeding of hemorrhoids   . Hepatitis    hx of medication induced hepatitis  (Vytorin per pt)  . History of kidney stones   . History of loop recorder   . HLD (hyperlipidemia)   . HTN (hypertension)   . Hx of echocardiogram    echo 1/13: EF 55%, Gr 2 diast dysfn, Asc Ao aneurysm 4.8 cm, mild MR, mod LAE, mild RAE  . Hypothyroidism   . ICH (intracerebral hemorrhage) ([place]) 03/24/15  . NASH (nonalcoholic steatohepatitis)   . Neuromuscular disorder ([place])   . Pacemaker-Medtronic 11/12/2011   Implanted 2013   . Persistent atrial fibrillation ([place])   . PMR (polymyalgia rheumatica) (HCC)    no steriods for 5 years  . sinus node dysfunction//post termination pauses   . Stroke ([place])   . Urinary tract infection    Recurrent infections.     Past Surgical History:  Procedure Laterality Date  . CARDIAC CATHETERIZATION    . CARDIOVERSION  03/02/2012   Procedure: CARDIOVERSION;  Surgeon: Lelon Perla, MD;  Location: [facility] ENDOSCOPY;  Service: Cardiovascular;  Laterality: N/A;  . CARDIOVERSION  03/13/2012   Procedure: CARDIOVERSION;  Surgeon: Josue Hector, MD;  Location: [facility] ENDOSCOPY;  Service: Cardiovascular;  Laterality: N/A;  . CARDIOVERSION  04/15/2012   Procedure: CARDIOVERSION;  Surgeon: [name], MD;  Location: [facility] ENDOSCOPY;  Service: Cardiovascular;  Laterality: N/A;  . CARDIOVERSION N/A 06/07/2014   Procedure: CARDIOVERSION;  Surgeon: [name], MD;  Location: [place];  Service: Cardiovascular;  Laterality: N/A;  . CARDIOVERSION  N/A 08/18/2014   Procedure: CARDIOVERSION;  Surgeon: [name], MD;  Location: [place];  Service: Cardiovascular;  Laterality: N/A;  . CARDIOVERSION N/A 09/06/2015   Procedure: CARDIOVERSION;  Surgeon: Jerline Pain, MD;  Location: [place];  Service: Cardiovascular;  Laterality: N/A;  . CYSTOSCOPY WITH STENT PLACEMENT Right 04/22/2016   Procedure: CYSTOSCOPY WITH RIGHT URETERAL STENT INSERTION;  Surgeon: [name], MD;  Location: [place];  Service: Urology;  Laterality: Right;  . CYSTOSCOPY WITH  URETEROSCOPY, STONE BASKETRY AND STENT PLACEMENT Right 04/30/2016   Procedure: CYSTOSCOPY WITH URETEROSCOPY, STENT PLACEMENT REMOVAL;  Surgeon: [name], MD;  Location: [facility] ORS;  Service: Urology;  Laterality: Right;  . HERNIA REPAIR    . INCISION AND DRAINAGE BREAST ABSCESS  1973  . LEFT HEART CATH AND CORONARY ANGIOGRAPHY N/A 10/28/2017   Procedure: LEFT HEART CATH AND CORONARY ANGIOGRAPHY;  Surgeon: Lorretta Harp, MD;  Location: [place] CV LAB;  Service: Cardiovascular;  Laterality: N/A;  . PACEMAKER INSERTION  Jan 2013  . PERMANENT PACEMAKER INSERTION N/A 07/29/2011   Procedure: PERMANENT PACEMAKER INSERTION;  Surgeon: [name], MD;  Location: [facility] CATH LAB;  Service: Cardiovascular;  Laterality: N/A;  . UMBILICAL HERNIA REPAIR  1950's    Current Outpatient Medications  Medication Sig Dispense Refill  . apixaban (ELIQUIS) 5 MG TABS tablet Take 1 tablet (5 mg total) 2 (two) times daily by mouth. 180 tablet 3  . Calcium Carbonate-Vitamin D (CALTRATE 600+D PO) Take 1 tablet by mouth daily.     . cetirizine (ZYRTEC) 10 MG tablet Take 10 mg by mouth daily as needed for allergies.     . Cholecalciferol (VITAMIN D3) 1000 UNITS tablet Take 1,000 Units by mouth daily.     Marland Kitchen diltiazem (CARDIZEM CD) 240 MG 24 hr capsule TAKE 1 CAPSULE BY MOUTH DAILY (Patient taking differently: TAKE 240 mg CAPSULE BY MOUTH  in the evening) 90 capsule 3  . furosemide (LASIX) 20 MG tablet TAKE 1 TABLET BY MOUTH DAILY (Patient taking differently: TAKE 20 mg  TABLET BY MOUTH DAILY) 30 tablet 6  . irbesartan (AVAPRO) 150 MG tablet TAKE 1 TABLET BY MOUTH DAILY 90 tablet 3  . labetalol (NORMODYNE) 300 MG tablet TAKE 1 TABLET BY MOUTH TWICE DAILY 180 tablet 3  . levothyroxine (SYNTHROID, LEVOTHROID) 25 MCG tablet Take 25 mcg by mouth daily before breakfast.    . potassium chloride (K-DUR) 10 MEQ tablet Take 1 tablet (10 mEq total) by mouth 2 (two) times daily. 180 tablet 3  . WELCHOL 625 MG tablet Take 625 mg by  mouth every morning and 1250 mg by mouth at bedtime.     No current facility-administered medications for this visit.     Allergies  Allergen Reactions  . Penicillins Anaphylaxis    Has patient had a PCN reaction causing immediate rash, facial/tongue/throat swelling, SOB or lightheadedness with hypotension: Yes Has patient had a PCN reaction causing severe rash involving mucus membranes or skin necrosis: No Has patient had a PCN reaction that required hospitalization No Has patient had a PCN reaction occurring within the last 10 years: No If all of the above answers are "NO", then may proceed with Cephalosporin use.   . Statins Other (See Comments)    REACTION: elevated LFT's  . Tylenol [Acetaminophen] Other (See Comments)    If taken with Vytorin at risk for liver damage  . Amiodarone Other (See Comments)  . Tape Other (See Comments)    Redness, Please use "  paper" tape only.  . Codeine Other (See Comments)    feel funny, head is fuzzy    Review of Systems negative except from HPI and PMH  Physical Exam BP 140/88   Pulse 84   Ht 5' 6"  (1.676 m)   Wt 173 lb (78.5 kg)   SpO2 99%   BMI 27.92 kg/m  Well developed and nourished in no acute distress HENT normal Neck supple with JVP-flat Carotids brisk and full without bruits Clear Irregularly irregular rate and rhythm with controlled ventricular response, no murmurs or gallops Abd-soft with active BS  No Clubbing cyanosis 1-2+ edema Skin-warm and dry A & Oriented  Grossly normal sensory and motor function   ECG demonstrates ventricular pacing at 82 Underlying atrial fibrillation -/11/41   Assessment and  Plan   Atrial fibrillaton permanent   Thoracic aortic aneurysm  Hypertension    Pacemaker The patient's device was interrogated.  The information was reviewed. No changes were made in the programming.     Iatrogenic hypothyrodism  HFpEF  Euvolemic continue current meds  On Anticoagulation;  No bleeding  issues   Swelling in her feet, without neck veins and within normal LVEDP but will be drug-related.  We will have her stop her diltiazem.  This will likely have an aggravating effect on her blood pressure but if we can resolve the cause of her edema that would be beneficial and avoid more medications   In the interim we will increase her furosemide from 20--60 x 3 days.    We spent more than 50% of our >25 min visit in face to face counseling regarding the above L

## 2017-11-24 NOTE — Patient Instructions (Signed)
Medication Instructions:  Your physician has recommended you make the following change in your medication:   1. Hold your Cardizem for 3 weeks 2. Increase your Lasix to 71m (3 tablets) for 3 days, then begin your normal dose of 263m(one tablet) every day.  Labwork: None ordered.  Testing/Procedures: None ordered.  Follow-Up: Your physician wants you to follow-up in: One Year with Dr KlGari Crownill receive a reminder letter in the mail two months in advance. If you don't receive a letter, please call our office to schedule the follow-up appointment.  Remote monitoring is used to monitor your Pacemaker from home. This monitoring reduces the number of office visits required to check your device to one time per year. It allows [employer] keep an eye on the functioning of your device to ensure it is working properly. You are scheduled for a device check from home on 12/25/2017. You may send your transmission at any time that day. If you have a wireless device, the transmission will be sent automatically. After your physician reviews your transmission, you will receive a postcard with your next transmission date.   Any Other Special Instructions Will Be Listed Below (If Applicable)  Please call Kraven Calk, RN next week with an update on your swelling.  If you need a refill on your cardiac medications before your next appointment, please call your pharmacy.

## 2017-12-02 ENCOUNTER — Telehealth: Payer: Self-pay | Admitting: Internal Medicine

## 2017-12-02 MED ORDER — FUROSEMIDE 20 MG PO TABS: 40.0000 mg | ORAL_TABLET | Freq: Every day | ORAL | 6 refills | Status: DC

## 2017-12-02 NOTE — Telephone Encounter (Signed)
Per Dr Caryl Comes, pt is to continue to hold her diltiazem and increase her lasix to 59m per day. Pt understands to keep a watch on her BP and HR to be sure they are not creeping up. Pt verbalized understanding and will call with any changes.

## 2017-12-02 NOTE — Telephone Encounter (Signed)
New Message:      Pt is calling to give an update on her status.

## 2017-12-02 NOTE — Telephone Encounter (Signed)
Pt calls today with c/o bilateral ankle swelling despite stopping the diltiazem. She states her ankle swelling decreased with the increase in lasix but has gone back up in the last day or so. I told her I would relay the message to Dr Caryl Comes for further recommendation.

## 2017-12-05 ENCOUNTER — Telehealth: Payer: Self-pay | Admitting: Internal Medicine

## 2017-12-05 DIAGNOSIS — Z79899 Other long term (current) drug therapy: Secondary | ICD-10-CM

## 2017-12-05 DIAGNOSIS — I482 Chronic atrial fibrillation, unspecified: Secondary | ICD-10-CM

## 2017-12-05 NOTE — Telephone Encounter (Signed)
Pt called to clarify her lasix and diltiazem. She states her heart rate feels irregular and would like to know if she could start her diltiazem again. I advised her that was appropriate as Dr Caryl Comes wanted her to hold it x 3wks due to swelling. She states her swelling is much better since increasing her lasix to 30m qd. She is going to start back on her Diltiazem for her heart rate (resting in is the high 90s) and continue on her 420mlasix qd. She will be in on June 3 for a BMP to check potassium with her increase in lasix. Pt agrees with plan and verbalizes understanding.

## 2017-12-05 NOTE — Telephone Encounter (Signed)
New Message:        Pt c/o medication issue:  1. Name of Medication:  furosemide (LASIX) 20 MG tablet  diltiazem (CARDIZEM CD) 240 MG 24 hr capsule  2. How are you currently taking this medication (dosage and times per day)? Take 2 tablets (40 mg total) by mouth daily.  3. Are you having a reaction (difficulty breathing--STAT)? No  4. What is your medication issue? Pt is calling about the quantity of the medication

## 2017-12-09 NOTE — Addendum Note (Signed)
Addended by: Dollene Primrose on: 12/09/2017 08:10 AM   Modules accepted: Orders

## 2017-12-15 ENCOUNTER — Other Ambulatory Visit: Payer: Medicare Other | Admitting: *Deleted

## 2017-12-15 DIAGNOSIS — Z79899 Other long term (current) drug therapy: Secondary | ICD-10-CM

## 2017-12-15 DIAGNOSIS — I482 Chronic atrial fibrillation, unspecified: Secondary | ICD-10-CM

## 2017-12-15 LAB — BASIC METABOLIC PANEL
BUN / CREAT RATIO: 12 (ref 12–28)
BUN: 10 mg/dL (ref 8–27)
CALCIUM: 9.1 mg/dL (ref 8.7–10.3)
CHLORIDE: 108 mmol/L — AB (ref 96–106)
CO2: 25 mmol/L (ref 20–29)
Creatinine, Ser: 0.81 mg/dL (ref 0.57–1.00)
GFR calc Af Amer: 77 mL/min/{1.73_m2} (ref >59)
GFR calc non Af Amer: 67 mL/min/{1.73_m2} (ref >59)
Glucose: 90 mg/dL (ref 65–99)
POTASSIUM: 4.1 mmol/L (ref 3.5–5.2)
SODIUM: 142 mmol/L (ref 134–144)

## 2017-12-30 ENCOUNTER — Telehealth: Payer: Self-pay | Admitting: Cardiology

## 2017-12-30 ENCOUNTER — Encounter: Payer: Medicare Other | Admitting: *Deleted

## 2017-12-30 NOTE — Telephone Encounter (Signed)
LMOVM reminding pt to send remote transmission.   

## 2018-01-02 ENCOUNTER — Ambulatory Visit (INDEPENDENT_AMBULATORY_CARE_PROVIDER_SITE_OTHER): Payer: Medicare Other | Admitting: *Deleted

## 2018-01-02 DIAGNOSIS — I495 Sick sinus syndrome: Secondary | ICD-10-CM | POA: Diagnosis not present

## 2018-01-05 ENCOUNTER — Telehealth: Payer: Self-pay | Admitting: Internal Medicine

## 2018-01-05 MED ORDER — FUROSEMIDE 40 MG PO TABS: 40.0000 mg | ORAL_TABLET | Freq: Every day | ORAL | 3 refills | Status: DC

## 2018-01-05 NOTE — Telephone Encounter (Signed)
Pt calling regarding her Furosimide, she is to take 2 pills by mouth for a total of 84m, the rx was for 226mtab but the qty was 30, she will need 60 in order to hve enough for the month, or she states they have a 4010mab which would be easier for her if she could get it changed to 30 day supply for 13m31ms advise uses [facility]

## 2018-01-06 ENCOUNTER — Telehealth: Payer: Self-pay | Admitting: Internal Medicine

## 2018-01-06 NOTE — Telephone Encounter (Signed)
Pt calling in refernce to message that was left from yesterday. Copy of message from yesterday is below. Pt stated that no one has call her back.     Pt calling regarding her Furosimide, she is to take 2 pills by mouth for a total of 69m, the rx was for 269mtab but the qty was 30, she will need 60 in order to hve enough for the month, or she states they have a 4020mab which would be easier for her if she could get it changed to 30 day supply for 70m4ms advise uses [facility]

## 2018-01-06 NOTE — Telephone Encounter (Signed)
Prescription was sent into her pharmacy on 6/24. Pt made aware and had no additional questions.

## 2018-02-06 ENCOUNTER — Encounter: Payer: Self-pay | Admitting: Cardiology

## 2018-02-06 NOTE — Progress Notes (Signed)
Remote pacemaker transmission.   

## 2018-02-09 ENCOUNTER — Other Ambulatory Visit: Payer: Self-pay | Admitting: Internal Medicine

## 2018-02-09 DIAGNOSIS — Z1231 Encounter for screening mammogram for malignant neoplasm of breast: Secondary | ICD-10-CM

## 2018-02-10 LAB — CUP PACEART REMOTE DEVICE CHECK
Battery Impedance: 508 Ohm
Brady Statistic RV Percent Paced: 31 %
Implantable Lead Implant Date: 20130114
Implantable Lead Implant Date: 20130114
Implantable Lead Location: 753859
Lead Channel Impedance Value: 666 Ohm
Lead Channel Impedance Value: 67 Ohm
Lead Channel Setting Pacing Amplitude: 2.5 V
Lead Channel Setting Pacing Pulse Width: 0.4 ms
Lead Channel Setting Sensing Sensitivity: 5.6 mV
MDC IDC LEAD LOCATION: 753860
MDC IDC MSMT BATTERY REMAINING LONGEVITY: 101 mo
MDC IDC MSMT BATTERY VOLTAGE: 2.78 V
MDC IDC MSMT LEADCHNL RV PACING THRESHOLD AMPLITUDE: 0.5 V
MDC IDC MSMT LEADCHNL RV PACING THRESHOLD PULSEWIDTH: 0.4 ms
MDC IDC PG IMPLANT DT: 20130114
MDC IDC SESS DTM: 20190621110705

## 2018-02-11 ENCOUNTER — Telehealth: Payer: Self-pay | Admitting: Internal Medicine

## 2018-02-11 NOTE — Telephone Encounter (Signed)
Pt states today she has had a nagging dry cough. Her PCP suggested this was due to your Irbesartan. I advised pt that I did not think her irbesartan was the culprit of her cough as this is not a typical side effect. I suggest to pt on days she has her cough to take an additional lasix and see if this helps her cough. If so, to call the office for possible med changes as she may be slightly fluid overloaded. Otherwise, if this did not help, she may want to seek advisement from an ENT. Pt verbalized understanding and had no additional questions.

## 2018-02-11 NOTE — Telephone Encounter (Signed)
New message      Pt c/o medication issue:  1. Name of Medication: irbesartan (AVAPRO) 150 MG tablet  2. How are you currently taking this medication (dosage and times per day)? 1 time per day  150 mg   3. Are you having a reaction (difficulty breathing--STAT)? No   4. What is your medication issue? Pt has a cough.

## 2018-03-12 ENCOUNTER — Ambulatory Visit
Admission: RE | Admit: 2018-03-12 | Discharge: 2018-03-12 | Disposition: A | Discharge status: Home / Self Care | Payer: Medicare Other | Source: Ambulatory Visit | Attending: Internal Medicine | Admitting: Internal Medicine

## 2018-03-12 DIAGNOSIS — Z1231 Encounter for screening mammogram for malignant neoplasm of breast: Secondary | ICD-10-CM

## 2018-04-13 ENCOUNTER — Encounter: Payer: Medicare Other | Admitting: *Deleted

## 2018-04-13 ENCOUNTER — Telehealth: Payer: Self-pay | Admitting: Cardiology

## 2018-04-13 ENCOUNTER — Telehealth: Payer: Self-pay

## 2018-04-13 NOTE — Telephone Encounter (Signed)
LMOVM reminding pt to send remote transmission.   

## 2018-04-13 NOTE — Telephone Encounter (Signed)
Pt called [employer] tech support and her monitor is not working. They are sending her a new monitor. The pt told me to make sure [name] [name] nurse knows her monitor not working.

## 2018-04-13 NOTE — Telephone Encounter (Signed)
Noted  

## 2018-04-21 ENCOUNTER — Ambulatory Visit (INDEPENDENT_AMBULATORY_CARE_PROVIDER_SITE_OTHER): Payer: Medicare Other | Admitting: *Deleted

## 2018-04-21 DIAGNOSIS — I495 Sick sinus syndrome: Secondary | ICD-10-CM

## 2018-04-22 NOTE — Progress Notes (Signed)
Remote pacemaker transmission.   

## 2018-04-30 ENCOUNTER — Encounter: Payer: Self-pay | Admitting: Cardiology

## 2018-05-25 ENCOUNTER — Telehealth: Payer: Self-pay

## 2018-05-25 NOTE — Telephone Encounter (Signed)
Message sent to Dr. Caryl Comes to manage and refill pts eliquis for her atrial fibrillation. Patient requesting refill. Last seen 04/2017. Pt was release back to primary doctor in 04/2017.

## 2018-05-27 ENCOUNTER — Other Ambulatory Visit: Payer: Self-pay

## 2018-05-27 DIAGNOSIS — I480 Paroxysmal atrial fibrillation: Secondary | ICD-10-CM

## 2018-05-27 LAB — CUP PACEART REMOTE DEVICE CHECK
Battery Impedance: 583 Ohm
Battery Remaining Longevity: 96 mo
Battery Voltage: 2.79 V
Brady Statistic RV Percent Paced: 34 %
Date Time Interrogation Session: 20191007131617
Implantable Lead Implant Date: 20130114
Implantable Lead Location: 753859
Implantable Lead Location: 753860
Lead Channel Setting Sensing Sensitivity: 5.6 mV
MDC IDC LEAD IMPLANT DT: 20130114
MDC IDC MSMT LEADCHNL RA IMPEDANCE VALUE: 67 Ohm
MDC IDC MSMT LEADCHNL RV IMPEDANCE VALUE: 731 Ohm
MDC IDC MSMT LEADCHNL RV PACING THRESHOLD AMPLITUDE: 0.625 V
MDC IDC MSMT LEADCHNL RV PACING THRESHOLD PULSEWIDTH: 0.4 ms
MDC IDC PG IMPLANT DT: 20130114
MDC IDC SET LEADCHNL RV PACING AMPLITUDE: 2.5 V
MDC IDC SET LEADCHNL RV PACING PULSEWIDTH: 0.4 ms

## 2018-05-27 MED ORDER — APIXABAN 5 MG PO TABS: 5.0000 mg | ORAL_TABLET | Freq: Two times a day (BID) | ORAL | 1 refills | Status: DC

## 2018-05-27 NOTE — Telephone Encounter (Signed)
Pt last saw Dr Caryl Comes 11/24/17, last labs 12/15/17 Creat 0.81, age 84, weight 78.5kg, based on specified criteria pt is on appropriate dosage of Eliquis 84m BID. Will refill rx.

## 2018-05-28 ENCOUNTER — Telehealth: Payer: Self-pay

## 2018-05-28 DIAGNOSIS — I480 Paroxysmal atrial fibrillation: Secondary | ICD-10-CM

## 2018-05-28 MED ORDER — APIXABAN 5 MG PO TABS
5.0000 mg | ORAL_TABLET | Freq: Two times a day (BID) | ORAL | 3 refills | Status: DC
Start: 2018-05-28 — End: 2019-06-22

## 2018-05-28 NOTE — Telephone Encounter (Signed)
Spoke with pt to verify her preferred pharmacy. Refill orders have been placed. Pt will have her PCP send current labs, drawn last week, sent to our office for review. Pt understands we will need lab work every 6 months while on Eliquis.

## 2018-05-28 NOTE — Telephone Encounter (Signed)
-----   Message from Marval Regal, RN sent at 05/27/2018  7:09 AM EST ----- Thanks Dr.Klein I appreciate the response, have a good day ----- Message ----- From: [name], MD Sent: 05/26/2018   6:24 PM EST To: Marval Regal, RN, Dollene Primrose, RN  We are glad to take over the [facility]  ----- Message ----- From: Marval Regal, RN Sent: 05/25/2018   5:23 PM EST To: [name], MD  Hello Dr. Caryl Comes, pt last seen [id number] by DR. Xu. She no longer comes to our office. Dr. Erlinda Hong gave her a year of eliquis until she saw cardiology for her atrial fibrillation. Dr. Erlinda Hong is no longer at our office as of 12/12/2017. We ask that cardiology handle the eliquis for atrial fibrillation. Can you do a refill for patient. If you have any questions or concerns feel free to send me a staff message or call me thanks. Have a good day.   Katrina Rn

## 2018-07-21 ENCOUNTER — Ambulatory Visit (INDEPENDENT_AMBULATORY_CARE_PROVIDER_SITE_OTHER): Payer: Medicare Other

## 2018-07-21 DIAGNOSIS — I495 Sick sinus syndrome: Secondary | ICD-10-CM | POA: Diagnosis not present

## 2018-07-22 NOTE — Progress Notes (Signed)
Remote pacemaker transmission.   

## 2018-07-23 LAB — CUP PACEART REMOTE DEVICE CHECK
Battery Impedance: 633 Ohm
Battery Voltage: 2.77 V
Date Time Interrogation Session: 20200107131259
Implantable Lead Implant Date: 20130114
Implantable Lead Location: 753859
Implantable Lead Location: 753860
Implantable Lead Model: 5076
Lead Channel Impedance Value: 67 Ohm
Lead Channel Setting Pacing Amplitude: 2.5 V
Lead Channel Setting Pacing Pulse Width: 0.4 ms
Lead Channel Setting Sensing Sensitivity: 5.6 mV
MDC IDC LEAD IMPLANT DT: 20130114
MDC IDC MSMT BATTERY REMAINING LONGEVITY: 92 mo
MDC IDC MSMT LEADCHNL RV IMPEDANCE VALUE: 680 Ohm
MDC IDC MSMT LEADCHNL RV PACING THRESHOLD AMPLITUDE: 0.5 V
MDC IDC MSMT LEADCHNL RV PACING THRESHOLD PULSEWIDTH: 0.4 ms
MDC IDC PG IMPLANT DT: 20130114
MDC IDC STAT BRADY RV PERCENT PACED: 35 %

## 2018-08-19 ENCOUNTER — Telehealth: Payer: Self-pay | Admitting: Internal Medicine

## 2018-08-19 NOTE — Telephone Encounter (Signed)
Nurse for Dr. Caryl Comes not available today.

## 2018-08-19 NOTE — Telephone Encounter (Signed)
Call placed to Pt.  Pt was seen at Dr. Crissie Sickles office today for hemorrhoids with decreased hemoglobin.  At OV Pt was noted to have BP of 79/50.  Pt states she has not felt well for months.  States she has had ongoing fatigue and lower and upper back pain.    Pt states when she got home she retook her BP with 102/57 and pulse of 100. Pt states there has been no change in her dietary habits (fluid intake).  Pt states historically her BP's were in the 140/80's.  Spoke with DOD.  Per DOD have Pt hold irbesartan and lasix.    Pt states she has already taken these medications today.  Advised to hold tomorrow morning.  Asked Pt if she would like to be seen by a structural cardiologist for follow up (Pt only sees Dr. Caryl Comes).  Pt would like to have a general cardiology follow up.  New patient appt made with Dr. Harrell Gave.  Pt indicates understanding.  Outreach made to Dr. Barrie Lyme office with [facility].  Office to fax note from today's visit to [facility] office to the attention of Dr. Harrell Gave.

## 2018-08-19 NOTE — Telephone Encounter (Signed)
New Message   Pt c/o BP issue: STAT if pt c/o blurred vision, one-sided weakness or slurred speech  1. What are your last 5 BP readings? Patient only took her BP today at Dr. Crissie Sickles office and it was 79/50  2. Are you having any other symptoms (ex. Dizziness, headache, blurred vision, passed out)? No   3. What is your BP issue? Patient's has been fatigued lately and her blood pressure readings have been low.

## 2018-08-20 ENCOUNTER — Ambulatory Visit: Payer: Medicare Other | Admitting: Cardiology

## 2018-08-20 ENCOUNTER — Encounter: Payer: Self-pay | Admitting: Cardiology

## 2018-08-20 VITALS — BP 90/64 | HR 87 | Ht 66.0 in | Wt 162.0 lb

## 2018-08-20 DIAGNOSIS — I495 Sick sinus syndrome: Secondary | ICD-10-CM | POA: Diagnosis not present

## 2018-08-20 DIAGNOSIS — I2510 Atherosclerotic heart disease of native coronary artery without angina pectoris: Secondary | ICD-10-CM

## 2018-08-20 DIAGNOSIS — I959 Hypotension, unspecified: Secondary | ICD-10-CM | POA: Diagnosis not present

## 2018-08-20 DIAGNOSIS — I5032 Chronic diastolic (congestive) heart failure: Secondary | ICD-10-CM

## 2018-08-20 DIAGNOSIS — Z7901 Long term (current) use of anticoagulants: Secondary | ICD-10-CM

## 2018-08-20 DIAGNOSIS — I4821 Permanent atrial fibrillation: Secondary | ICD-10-CM | POA: Diagnosis not present

## 2018-08-20 NOTE — Progress Notes (Signed)
Cardiology Office Note:    Date:  08/20/2018   ID:  [name] [name], [name] [name] 08-22-1930, MRN [id number]  PCP:  [name] Gravel, MD  Cardiologist:  [name] Dresser, MD PhD  Referring MD: [name] Gravel, MD   CC: establish care, hypotension   History of Present Illness:    [name] [name] is a 85 y.o. female with a hx of permanent atrial fibrillation on apixaban, sinus node dysfunction s/p PPM, nonobstructive CAD, hypertension, LE edema who is seen as a new consult at the request of [name] Gravel, MD for the evaluation and management of hypotension. Follows with Dr. Caryl [name] for atrial fibrillation and pacemaker.   Yesterday was at a doctor's visit, was noted to have very low blood pressure. Doesn't check blood pressure routinely at home, but previously has been 140s/upper 70s. Yesterday was the first low number she has had. She notes no recent medication changes. She has felt very fatigued for about the last three months. She is being followed by GI for hemorrhoidal bleeding, and she is having her blood counts monitored. Denies fevers, chills. Has fair oral intake, no recent changes to diet. Held her irbesartan and furosemide, and her BP is still borderline today. No syncope, but does feel lightheaded on standing.   Denies chest [name], shortness of breath at rest or with normal exertion. No PND, orthopnea, or unexpected weight gain. No syncope or palpitations.  Past Medical History:  Diagnosis Date  . Ascending aorta enlargement (HCC)    4.8 cm per echo 08/07/11; 5.0 by CT in Jan 2013  . CAD (coronary artery disease)    LHC 5/08:  pOM 20%, pRCA 20-30%, EF 60%  . Hemorrhoids    current problem as of 04/26/16  with slight bleeding of hemorrhoids   . Hepatitis    hx of medication induced hepatitis (Vytorin per pt)  . History of kidney stones   . History of loop recorder   . HLD (hyperlipidemia)   . HTN (hypertension)   . Hx of echocardiogram    echo 1/13: EF 55%, Gr 2 diast dysfn, Asc Ao aneurysm 4.8  cm, mild MR, mod LAE, mild RAE  . Hypothyroidism   . ICH (intracerebral hemorrhage) ([place]) 03/24/15  . NASH (nonalcoholic steatohepatitis)   . Neuromuscular disorder ([place])   . Pacemaker-[employer]tr[employer] 11/12/2011   Implanted 2013   . Persistent atrial fibrillation   . PMR (polymyalgia rheumatica) (HCC)    no steriods for 5 years  . sinus node dysfunction//post termination pauses   . Stroke ([place])   . Urinary tract infection    Recurrent infections.     Past Surgical History:  Procedure Laterality Date  . CARDIAC CATHETERIZATION    . CARDIOVERSION  03/02/2012   Procedure: CARDIOVERSION;  Surgeon: [name] Perla, MD;  Location: [facility] ENDOSCOPY;  Service: Cardiovascular;  Laterality: N/A;  . CARDIOVERSION  03/13/2012   Procedure: CARDIOVERSION;  Surgeon: [name] Hector, MD;  Location: [facility] ENDOSCOPY;  Service: Cardiovascular;  Laterality: N/A;  . CARDIOVERSION  04/15/2012   Procedure: CARDIOVERSION;  Surgeon: [name] Coco, MD;  Location: [facility] ENDOSCOPY;  Service: Cardiovascular;  Laterality: N/A;  . CARDIOVERSION N/A 06/07/2014   Procedure: CARDIOVERSION;  Surgeon: [name] Casino, MD;  Location: [facility] ENDOSCOPY;  Service: Cardiovascular;  Laterality: N/A;  . CARDIOVERSION N/A 08/18/2014   Procedure: CARDIOVERSION;  Surgeon: [name] Spark, MD;  Location: [place];  Service: Cardiovascular;  Laterality: N/A;  . CARDIOVERSION N/A 09/06/2015   Procedure: CARDIOVERSION;  Surgeon: [name] Pain, MD;  Location: [facility] ENDOSCOPY;  Service: Cardiovascular;  Laterality: N/A;  . CYSTOSCOPY WITH STENT PLACEMENT Right 04/22/2016   Procedure: CYSTOSCOPY WITH RIGHT URETERAL STENT INSERTION;  Surgeon: [name] Seal, MD;  Location: [place];  Service: Urology;  Laterality: Right;  . CYSTOSCOPY WITH URETEROSCOPY, STONE BASKETRY AND STENT PLACEMENT Right 04/30/2016   Procedure: CYSTOSCOPY WITH URETEROSCOPY, STENT PLACEMENT REMOVAL;  Surgeon: [name] Seal, MD;  Location: [facility] ORS;  Service: Urology;  Laterality: Right;  . HERNIA  REPAIR    . INCISION AND DRAINAGE BREAST ABSCESS  1973  . LEFT HEART CATH AND CORONARY ANGIOGRAPHY N/A 10/28/2017   Procedure: LEFT HEART CATH AND CORONARY ANGIOGRAPHY;  Surgeon: [name] Harp, MD;  Location: [place] CV LAB;  Service: Cardiovascular;  Laterality: N/A;  . PACEMAKER INSERTION  Jan 2013  . PERMANENT PACEMAKER INSERTION N/A 07/29/2011   Procedure: PERMANENT PACEMAKER INSERTION;  Surgeon: [name] Sprang, MD;  Location: [facility] CATH LAB;  Service: Cardiovascular;  Laterality: N/A;  . UMBILICAL HERNIA REPAIR  1950's    Current Medications: Current Outpatient Medications on File Prior to Visit  Medication Sig  . apixaban (ELIQUIS) 5 MG TABS tablet Take 1 tablet (5 mg total) by mouth 2 (two) times daily.  . Calcium Carbonate-Vitamin D (CALTRATE 600+D PO) Take 1 tablet by mouth daily.   . cetirizine (ZYRTEC) 10 MG tablet Take 10 mg by mouth daily as needed for allergies.   . Cholecalciferol (VITAMIN D3) 1000 UNITS tablet Take 1,000 Units by mouth daily.   Marland Kitchen diltiazem (CARDIZEM CD) 240 MG 24 hr capsule TAKE 1 CAPSULE BY MOUTH DAILY (Patient taking differently: TAKE 240 mg CAPSULE BY MOUTH  in the evening)  . irbesartan (AVAPRO) 150 MG tablet TAKE 1 TABLET BY MOUTH DAILY  . labetalol (NORMODYNE) 300 MG tablet TAKE 1 TABLET BY MOUTH TWICE DAILY  . levothyroxine (SYNTHROID, LEVOTHROID) 25 MCG tablet Take 25 mcg by mouth daily before breakfast.  . potassium chloride (K-DUR) 10 MEQ tablet Take 1 tablet (10 mEq total) by mouth 2 (two) times daily.  [name] [name] 625 MG tablet Take 625 mg by mouth every morning and 1250 mg by mouth at bedtime.  . furosemide (LASIX) 40 MG tablet Take 1 tablet (40 mg total) by mouth daily.   No current facility-administered medications on file prior to visit.      Allergies:   Penicillins; Statins; Tylenol [acetaminophen]; Amiodarone; Tape; and Codeine   Social History   Socioeconomic History  . Marital status: Married    Spouse name: Not on file  .  Number of children: Not on file  . Years of education: Not on file  . Highest education level: Not on file  Occupational History  . Not on file  Social Needs  . Financial resource strain: Not on file  . Food insecurity:    Worry: Not on file    Inability: Not on file  . Transportation needs:    Medical: Not on file    Non-medical: Not on file  Tobacco Use  . Smoking status: Former Smoker    Types: Cigarettes    Last attempt to quit: 07/16/1990    Years since quitting: 28.1  . Smokeless tobacco: Never Used  Substance and Sexual Activity  . Alcohol use: No  . Drug use: No  . Sexual activity: Not on file  Lifestyle  . Physical activity:    Days per week: Not on file    Minutes per session: Not on file  . Stress: Not on  file  Relationships  . Social connections:    Talks on phone: Not on file    Gets together: Not on file    Attends religious service: Not on file    Active member of club or organization: Not on file    Attends meetings of clubs or organizations: Not on file    Relationship status: Not on file  Other Topics Concern  . Not on file  Social History Narrative   Pt lives in [place] with spouse.  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  ROS:   Please see the history of present illness.  Additional pertinent ROS:  Constitutional: Negative for chills, fever, night sweats, unintentional weight loss  HENT: Negative for ear [name] and hearing loss.   Eyes: Negative for loss of vision and eye [name].  Respiratory: Negative for cough, sputum, shortness of breath, wheezing.   Cardiovascular: See HPI. Gastrointestinal: Negative for abdominal [name], melena,  But positive for hematochezia.  Genitourinary: Negative for dysuria and hematuria.  Musculoskeletal: Negative for falls  and myalgias.  Skin: Negative for itching and rash.  Neurological: Negative for focal weakness, focal sensory changes and loss of consciousness.  Endo/Heme/Allergies: Does not bruise/bleed easily.    EKGs/Labs/Other Studies Reviewed:    The following studies were reviewed today: Cath 10/2017  Prox RCA to Mid RCA lesion is 30% stenosed.  The left ventricular systolic function is normal.  LV end diastolic pressure is normal.  The left ventricular ejection fraction is 55-65% by visual estimate.  Echo 2016 Left ventricle: The cavity size was normal. Wall thickness was   increased in a pattern of mild LVH. Systolic function was normal.   The estimated ejection fraction was in the range of 55% to 60%.   Wall motion was normal; there were no regional wall motion   abnormalities. The study is not technically sufficient to allow   evaluation of LV diastolic function. - Ascending aorta: The ascending aorta was mildly dilated to 4.4   cm. - Mitral valve: Mildly thickened leaflets, with late systolic   bileaflet prolapse. There was mild regurgitation. - Left atrium: Severely dilated at 50 ml/m2. - Right ventricle: Poorly visualized. Pacer wire or catheter noted   in right ventricle. - Right atrium: The atrium was mildly dilated. Pacer wire or   catheter noted in right atrium. - Tricuspid valve: There was moderate regurgitation. - Pulmonary arteries: PA peak pressure: 42 mm Hg (S). - Systemic veins: The IVC measures <2.1 cm, but does not collapse   >50%, suggesting an elevated RA pressure of 8 mmHg.  Impressions: - Compared to a prior echo in 2013, there are few changes.   Pacemaker wires are now noted. There is no longer a pericadrial   effusion. The ascending aorta is dilated to 4.4 cm at the most   distally visualized point. Consider dedicated imaging of the   aorta wtih CT. There is moderate TR with RVSP of 42 mmHg, the RA   pressure is estimated at 8 mmHg.   EKG:  EKG is  personally reviewed.  The ekg ordered today demonstrates atrial fibrillation with frequent V pacing, rate 87 bpm.   Recent Labs: 09/25/2017: TSH 1.930 10/27/2017: ALT 14 10/29/2017: Hemoglobin 12.4; Platelets 195 12/15/2017: BUN 10; Creatinine, Ser 0.81; Potassium 4.1; Sodium 142  Recent Lipid Panel  Component Value Date/Time   CHOL 178 10/28/2017 0700   TRIG 41 10/28/2017 0700   HDL 71 10/28/2017 0700   CHOLHDL 2.5 10/28/2017 0700   VLDL 8 10/28/2017 0700   LDLCALC 99 10/28/2017 0700    Physical Exam:    VS:  BP 90/64   Pulse 87   Ht 5' 6"  (1.676 m)   Wt 162 lb (73.5 kg)   BMI 26.15 kg/m    Orthostatic VS for the past 24 hrs (Last 3 readings):  BP- Sitting Pulse- Sitting BP- Standing at 0 minutes Pulse- Standing at 0 minutes BP- Standing at 3 minutes Pulse- Standing at 3 minutes  08/20/18 1242 112/63 83 108/66 102 109/68 89    Wt Readings from Last 3 Encounters:  08/20/18 162 lb (73.5 kg)  11/24/17 173 lb (78.5 kg)  10/29/17 160 lb 15 oz (73 kg)    GEN: Well nourished, well developed in no acute distress HEENT: Normal NECK: No JVD; No carotid bruits LYMPHATICS: No lymphadenopathy CARDIAC: irregular rhythm, normal S1 and S2, no murmurs, rubs, gallops. Radial pulses 2+ bilaterally. RESPIRATORY:  Clear to auscultation without rales, wheezing or rhonchi  ABDOMEN: Soft, non-tender, non-distended MUSCULOSKELETAL:  Bilateral 1+ ankle edema; No deformity  SKIN: Warm and dry NEUROLOGIC:  Alert and oriented x 3 PSYCHIATRIC:  Normal affect   ASSESSMENT:    1. Hypotension, unspecified hypotension type   2. Permanent atrial fibrillation   3. post termination pauses/.sinus bradycardia   4. CAD in native artery   5. Long term (current) use of anticoagulants   6. Chronic diastolic heart failure (HCC)    PLAN:    1. Hypotension: not orthostatic (reports that elevated HR immediately on standing was due to joint [name]). Unclear etiology. Until recently, her BP had been >953  systolic on all meds, and today on initial intake it was 90 systolic off irbesartan and furosemide. Does endorse hematochezia, being monitored for this by GI. Recent Hgb not far from her baseline. Reports decent oral intake, no recent illnesses.   -instructed her to hold irbesartan, diltiazem (given LE edema as well) until her follow up visit  -instructed her to continue to take the labetalol, as she reports having occasional elevated HR in the past and want to avoid beta blocker withdrawal  -changed lasix to weight based dosing until follow up  -instructed her to call if HR elevated, BP <20 or >233 systolic.  -will have her come back for BP check with PharmD clinic to see if meds can be restarted.  -will check BMET and TSH  -instructed on red flag warning signs that need immediate medical attention  -if no clear etiology, consider echo  Chronic diastolic heart failure: has mild LE edema today but otherwise euvolemic. LE edema likely to worsen holding lasix, but may be offset somewhat by also holding diltiazem. Instructed on weight monitoring, when to take lasix.  Addressed only briefly today: Permanent atrial fibrillation, on apixaban  -CHA2DS2/VAS Stroke Risk Points= 8 Sinus node dysfunctio (sinus brady/post termination pauses): s/p PPM Nonobstructive CAD: on colesevelam, consider discontinuing at follow up due to lack of data for risk prevention. No aspirin as she is on anticoagulation  Plan for follow up: 3-4 weeks for BP check with PharmD, 6-8 weeks with me  TIME SPENT WITH PATIENT: >45 minutes of direct patient care. More than 50% of that time was spent on coordination of care and counseling regarding hypotension as documented.  [name] Dresser, MD, PhD [facility]  Medication Adjustments/Labs and Tests Ordered: Current medicines are reviewed at length with the patient today.  Concerns regarding medicines are outlined above.  Orders Placed This Encounter    Procedures  . Basic metabolic panel  . TSH   No orders of the defined types were placed in this encounter.   Patient Instructions  Medication Instructions:  -don't take the irbesartan until your follow up visit -don't take the diltiazem until your follow up visit -continue taking the labetalol -weight yourself daily. Take the lasix if your weight goes up 3 lbs overnight or 5 lbs from your baseline  Weight -take your blood pressure daily. If the top number is less than 90 or more than 170, after you have taken the labetalol, call the office -we will get you follow up with the pharmacist in 3-4 weeks -call if you have passing out or blacking out  If you need a refill on your cardiac medications before your next appointment, please call your pharmacy.   Lab work: Your physician recommends that you return for lab work today (BMP, TSH)   Testing/Procedures: None  Follow-Up: At [facility], you and your health needs are our priority.  As part of our continuing mission to provide you with exceptional heart care, we have created designated Provider Care Teams.  These Care Teams include your primary Cardiologist (physician) and Advanced Practice Providers (APPs -  Physician Assistants and Nurse Practitioners) who all work together to provide you with the care you need, when you need it. You will need a follow up appointment in 6-8 weeks.  Please call our office 2 months in advance to schedule this appointment.  You may see Dr. Harrell Gave or one of the following Advanced Practice Providers on your designated Care Team:   Rosaria Ferries, PA-C . Jory Sims, DNP, ANP  Your physician recommends that you schedule a follow-up appointment with Pharm D in 3-4 weeks.     Signed, [name] Dresser, MD PhD 08/20/2018 6:38 PM    [place]

## 2018-08-20 NOTE — Patient Instructions (Addendum)
Medication Instructions:  -don't take the irbesartan until your follow up visit -don't take the diltiazem until your follow up visit -continue taking the labetalol -weight yourself daily. Take the lasix if your weight goes up 3 lbs overnight or 5 lbs from your baseline  Weight -take your blood pressure daily. If the top number is less than 90 or more than 170, after you have taken the labetalol, call the office -we will get you follow up with the pharmacist in 3-4 weeks -call if you have passing out or blacking out  If you need a refill on your cardiac medications before your next appointment, please call your pharmacy.   Lab work: Your physician recommends that you return for lab work today (BMP, TSH)   Testing/Procedures: None  Follow-Up: At [facility], you and your health needs are our priority.  As part of our continuing mission to provide you with exceptional heart care, we have created designated Provider Care Teams.  These Care Teams include your primary Cardiologist (physician) and Advanced Practice Providers (APPs -  Physician Assistants and Nurse Practitioners) who all work together to provide you with the care you need, when you need it. You will need a follow up appointment in 6-8 weeks.  Please call our office 2 months in advance to schedule this appointment.  You may see Dr. Harrell Gave or one of the following Advanced Practice Providers on your designated Care Team:   Rosaria Ferries, PA-C . Jory Sims, DNP, ANP  Your physician recommends that you schedule a follow-up appointment with Pharm D in 3-4 weeks.

## 2018-08-22 LAB — BASIC METABOLIC PANEL
BUN/Creatinine Ratio: 14 (ref 12–28)
BUN: 12 mg/dL (ref 8–27)
CALCIUM: 10 mg/dL (ref 8.7–10.3)
CO2: 20 mmol/L (ref 20–29)
Chloride: 108 mmol/L — ABNORMAL HIGH (ref 96–106)
Creatinine, Ser: 0.84 mg/dL (ref 0.57–1.00)
GFR calc Af Amer: 73 mL/min/{1.73_m2} (ref >59)
GFR calc non Af Amer: 64 mL/min/{1.73_m2} (ref >59)
Glucose: 123 mg/dL — ABNORMAL HIGH (ref 65–99)
Potassium: 4.1 mmol/L (ref 3.5–5.2)
Sodium: 141 mmol/L (ref 134–144)

## 2018-08-22 LAB — TSH: TSH: 0.575 u[IU]/mL (ref 0.450–4.500)

## 2018-08-24 ENCOUNTER — Telehealth: Payer: Self-pay | Admitting: Cardiology

## 2018-08-24 NOTE — Telephone Encounter (Signed)
Per pt since med changes felt good for the first  3 days and the last 3 days have felt bad B/P has crept up running 150-170/94-99 and heart rate has been "pounding" at 93-107 and pt feels fatigued Will forward to Dr Harrell Gave for review and recommendations ./cy

## 2018-08-24 NOTE — Telephone Encounter (Signed)
New Message   Pt c/o BP issue:  1. What are your last 5 BP readings? 154/94 Pulse 105 (8:30am 2/10),  2. Are you having any other symptoms (ex. Dizziness, headache, blurred vision, passed out)? Fatigue 3. What is your medication issue? None

## 2018-08-25 NOTE — Telephone Encounter (Signed)
Pt called to report that since she has been taken off of her BP meds at her last visit on 08/20/18.... irbesartan, diltiazem, lasix changed to prn weight which is good today at 164 lbs... she says that her BP has been 170/97, 150/99, 154/94 and HR staying above 100... she says she takes it randomly throughout the day after resting 20 minutes but is very anxious about stopping her meds she has been on for a long time...  She says she is feeling well except she can feel her heart racing and beating hard.  She reports that her ankle edema has been mild and not worsening. Advised her that I will talk with our pharmacist.. Dr. Harrell Gave has planned for her to see them for BP check and follow up and will call her back with their advice and recommendations.

## 2018-08-25 NOTE — Telephone Encounter (Signed)
Pt advised recommendation to restart the Irbesartan and will only check her BP twice a day... and I also talked to her bout trying to relax especially when checking her BP so it does not elevate it. Pt agreed and verbalized understanding.

## 2018-08-25 NOTE — Addendum Note (Signed)
Addended by: Crissie Reese on: 08/25/2018 02:42 PM   Modules accepted: Orders

## 2018-08-25 NOTE — Telephone Encounter (Signed)
Reviewed chart.  Have patient start back on irbesartan 150 mg daily.  She should be aware that it can take up to a week to see a steady drop in BP.  She should monitor home BP no more than twice daily and plan to keep appointment with [facility] in Feb 27

## 2018-08-27 ENCOUNTER — Telehealth: Payer: Self-pay | Admitting: Internal Medicine

## 2018-08-27 NOTE — Telephone Encounter (Signed)
Pt called to report that she thinks she is in Afib... she denies palpations, chest pain, dizziness.. but feels her heart is not "beating right".  .. She is still taking her Eliquis... and back on the Irbesartan but still off of the Diltiazem... her BP has been 119/67 HR 100, 133/80  HR 94... yesterday and today.   She is very anxious on the phone and wants to know who to see since she sees Dr. Harrell Gave and Dr. Caryl Comes. Asking to talk with Dr. Harrell Gave about her meds being stopped.   Dr. Caryl Comes is out on vacation and pt is asking if she can go back on all of her meds.. but her BP is improved.. will route her message to Dr. Harrell Gave for her advice.

## 2018-08-27 NOTE — Telephone Encounter (Signed)
Pt is requesting to speak to nurse from Dr. Olin Pia office.

## 2018-08-27 NOTE — Telephone Encounter (Signed)
Attempted to contact pt. Unable to leave message as phone continues to just ring.

## 2018-08-27 NOTE — Telephone Encounter (Signed)
New Message   PT is calling because she thinks her AFIB is out of rhythm  PT states she is feeling fine  Please call back

## 2018-08-27 NOTE — Telephone Encounter (Signed)
I am unfortunately rounding in the hospital this week and do not have any office slots until next week. We can try to get her to see an APP or doc who has an available office slot tomorrow. Based on the vitals given, it does not appear that she needs emergent evaluation in the ER, but she should use her judgement if she begins to feel worse.

## 2018-08-27 NOTE — Telephone Encounter (Signed)
Per Dr. Harrell Gave.... pt is going to be seen by Melina Copa PA tomorrow 08/28/18 at 2pm.. pt very appreciative.

## 2018-08-28 ENCOUNTER — Encounter: Payer: Self-pay | Admitting: Physician Assistant

## 2018-08-28 ENCOUNTER — Telehealth: Payer: Self-pay | Admitting: *Deleted

## 2018-08-28 ENCOUNTER — Ambulatory Visit: Payer: Medicare Other | Admitting: Physician Assistant

## 2018-08-28 VITALS — BP 142/92 | HR 107 | Ht 66.0 in | Wt 160.1 lb

## 2018-08-28 DIAGNOSIS — I2510 Atherosclerotic heart disease of native coronary artery without angina pectoris: Secondary | ICD-10-CM | POA: Diagnosis not present

## 2018-08-28 DIAGNOSIS — I10 Essential (primary) hypertension: Secondary | ICD-10-CM | POA: Diagnosis not present

## 2018-08-28 DIAGNOSIS — I4891 Unspecified atrial fibrillation: Secondary | ICD-10-CM | POA: Diagnosis not present

## 2018-08-28 DIAGNOSIS — I5032 Chronic diastolic (congestive) heart failure: Secondary | ICD-10-CM | POA: Diagnosis not present

## 2018-08-28 LAB — CBC
Hematocrit: 34.4 % (ref 34.0–46.6)
Hemoglobin: 11.7 g/dL (ref 11.1–15.9)
MCH: 31.5 pg (ref 26.6–33.0)
MCHC: 34 g/dL (ref 31.5–35.7)
MCV: 93 fL (ref 79–97)
PLATELETS: 214 10*3/uL (ref 150–450)
RBC: 3.71 x10E6/uL — ABNORMAL LOW (ref 3.77–5.28)
RDW: 14.9 % (ref 11.7–15.4)
WBC: 8.6 10*3/uL (ref 3.4–10.8)

## 2018-08-28 MED ORDER — DILTIAZEM HCL ER COATED BEADS 120 MG PO CP24: 120.0000 mg | ORAL_CAPSULE | Freq: Every day | ORAL | 3 refills | Status: DC

## 2018-08-28 NOTE — Telephone Encounter (Signed)
Called pt re: lab results, left a message for her to call back

## 2018-08-28 NOTE — Telephone Encounter (Signed)
-----   Message from Charlie Pitter, [place] sent at 08/28/2018  4:45 PM EST ----- Please let patient know Hgb is mildly decreased compared to prior but still within normal limits by our labs - good news! But needs continued f/u with GI as she plans given her hemorrhoidal bleeding. Please send copy to GI MD. Melina Copa PA-C

## 2018-08-28 NOTE — Patient Instructions (Signed)
Medication Instructions:  Your physician has recommended you make the following change in your medication:  1.  RESUME the Diltiazem 120 mg daily  If you need a refill on your cardiac medications before your next appointment, please call your pharmacy.   Lab work: TODAY:  CBC  If you have labs (blood work) drawn today and your tests are completely normal, you will receive your results only by: Marland Kitchen MyChart Message (if you have MyChart) OR . A paper copy in the mail If you have any lab test that is abnormal or we need to change your treatment, we will call you to review the results.  Testing/Procedures: None ordered  Follow-Up: Your physician recommends that you schedule a follow-up appointment in: 09/21/2018 ARRIVE AT 1:45 TO SEE DAYNA DUNN, PA-C   Any Other Special Instructions Will Be Listed Below (If Applicable). Call in with your blood pressure / heart rate readings on Monday.  Make sure you take your blood pressure 2-3 hours after you take your medications.

## 2018-08-28 NOTE — Progress Notes (Signed)
Cardiology Office Note    Date:  08/28/2018  ID:  Hermela, Hardt [birth year], MRN [id number] PCP:  [name], MD  Cardiologist:  Buford Dresser, MD  - although pt wishes to stick with Dr. Caryl Comes at this time (Has followed for a long time with him for afib)   Chief Complaint: f/u afib  History of Present Illness:  AISLYN HAYSE is a 85 y.o. female with history of permanent atrial fibrillation on apixaban, sinus node dysfunction s/p PPM, nonobstructive CAD (30% prox-mid RCA 10/2017), chronic diastolic CHF, dilated ascending aorta (followed by Dr. Servando Snare), tricuspid regurgitation, hypertension, hemorrhoidal bleeding, left parietal ICH, LE edema who presents back for evaluation of afib.  Regarding her afib, she was previously treated with flecainide and subsequently with Rythmol and then amiodarone. This is been complicated by hyperthyroidism prompting discontinuation. She has since been managed with rate control strategy. Per 11/2017 OV with Dr. Caryl Comes she was asymptomatic with her afib. Ablation was considered but deferred given asymptomatic nature of this. In 11/2017 she was having issues with swelling so diltiazem 244m was paused briefly, but she was able to resume without event. Earlier this month she was seen by PCP with low BP and was asked to f/u with cardiology acutely. She was informed she needed a general cardiologist and was set up with Dr. CHarrell Gave Etiology was unclear, checked at 90/64 at that visit. The patient was asked to hold diltiazem and irbesartan and change Lasix to PRN. Per phone note 2/10 she them began experiencing elevated BP and heart pounding. Irbesartan 1571mwas resumed. Labs 10/2017 showed K 4.1, Cr 0.84, TSH wnl, last CBC 10/2017 was normal although the patient recalls being told at the GI office 2-3 weeks ago that it might be borderline low. She recalls the number 9.8 but feels like this is wrong as she went back later and asked if it was that low and was  reassured it was only borderline. Last echo 2016 showed mild LVH, EF 55-60%, mildly dilated ascending aorta, late systolic MVP, mild MR, severely dilated LA, moderate TR. She is due for followup with Dr. GeServando Snareut has not scheduled that appt yet. She called in to be seen today reporting she is still symptomatic with her afib with higher HRs. Her BP remains elevated as well. She's not had any recurrent hypotension and it remains unclear what caused that. She continues to have hemorrhoidal BRBPR and is awaiting ongoing discussions with GI about surgical management. No CP or SOB. Edema remains well controlled off Lasix. Interestingly she reports UOP picked up when she d/c'd this med. She is here with husband of [id number].   Past Medical History:  Diagnosis Date  . Ascending aorta enlargement (HCC)    a. followed by Dr. GeServando Snare . Marland KitchenAD (coronary artery disease)    a. LHC 10/2017 30% RCA otherwise OK.  . Chronic diastolic CHF (congestive heart failure) ([facility]  . Hemorrhoids    current problem as of 04/26/16  with slight bleeding of hemorrhoids   . Hepatitis    hx of medication induced hepatitis (Vytorin per pt)  . History of kidney stones   . HLD (hyperlipidemia)   . HTN (hypertension)   . Hx of echocardiogram    echo 1/13: EF 55%, Gr 2 diast dysfn, Asc Ao aneurysm 4.8 cm, mild MR, mod LAE, mild RAE  . Hypothyroidism   . ICH (intracerebral hemorrhage) ([facility]  . NASH (nonalcoholic steatohepatitis)   . Neuromuscular disorder (  [place])   . Pacemaker-Medtronic 11/12/2011   Implanted 2013   . Permanent atrial fibrillation   . PMR (polymyalgia rheumatica) (HCC)    no steriods for 5 years  . sinus node dysfunction//post termination pauses   . Stroke ([place])   . Tricuspid regurgitation   . Urinary tract infection    Recurrent infections.     Past Surgical History:  Procedure Laterality Date  . CARDIAC CATHETERIZATION    . CARDIOVERSION  03/02/2012   Procedure: CARDIOVERSION;  Surgeon: Lelon Perla, MD;  Location[facility] ENDOSCOPY;  Service: Cardiovascular;  Laterality: N/A;  . CARDIOVERSION  03/13/2012   Procedure: CARDIOVERSION;  Surgeon: Josue Hector, MD;  Location: [place];  Service: Cardiovascular;  Laterality: N/A;  . CARDIOVERSION  04/15/2012   Procedure: CARDIOVERSION;  Surgeon: [name], MD;  Location: [place];  Service: Cardiovascular;  Laterality: N/A;  . CARDIOVERSION N/A 06/07/2014   Procedure: CARDIOVERSION;  Surgeon: [name], MD;  Location: [facility] ENDOSCOPY;  Service: Cardiovascular;  Laterality: N/A;  . CARDIOVERSION N/A 08/18/2014   Procedure: CARDIOVERSION;  Surgeon: [name], MD;  Location: [place];  Service: Cardiovascular;  Laterality: N/A;  . CARDIOVERSION N/A 09/06/2015   Procedure: CARDIOVERSION;  Surgeon: Jerline Pain, MD;  Location: [place];  Service: Cardiovascular;  Laterality: N/A;  . CYSTOSCOPY WITH STENT PLACEMENT Right 04/22/2016   Procedure: CYSTOSCOPY WITH RIGHT URETERAL STENT INSERTION;  Surgeon: [name], MD;  Location: [place];  Service: Urology;  Laterality: Right;  . CYSTOSCOPY WITH URETEROSCOPY, STONE BASKETRY AND STENT PLACEMENT Right 04/30/2016   Procedure: CYSTOSCOPY WITH URETEROSCOPY, STENT PLACEMENT REMOVAL;  Surgeon: [name], MD;  Location: [facility];  Service: Urology;  Laterality: Right;  . HERNIA REPAIR    . INCISION AND DRAINAGE BREAST ABSCESS  1973  . LEFT HEART CATH AND CORONARY ANGIOGRAPHY N/A 10/28/2017   Procedure: LEFT HEART CATH AND CORONARY ANGIOGRAPHY;  Surgeon: Lorretta Harp, MD;  Location: [place] CV LAB;  Service: Cardiovascular;  Laterality: N/A;  . PACEMAKER INSERTION  Jan 2013  . PERMANENT PACEMAKER INSERTION N/A 07/29/2011   Procedure: PERMANENT PACEMAKER INSERTION;  Surgeon: [name], MD;  Location: [facility] CATH LAB;  Service: Cardiovascular;  Laterality: N/A;  . UMBILICAL HERNIA REPAIR  1950's    Current Medications: Current Meds  Medication Sig  . apixaban  (ELIQUIS) 5 MG TABS tablet Take 1 tablet (5 mg total) by mouth 2 (two) times daily.  . Calcium Carbonate-Vitamin D (CALTRATE 600+D PO) Take 1 tablet by mouth daily.   . cetirizine (ZYRTEC) 10 MG tablet Take 10 mg by mouth daily as needed for allergies.   . Cholecalciferol (VITAMIN D3) 1000 UNITS tablet Take 1,000 Units by mouth daily.   . hydrocortisone (ANUSOL-HC) 2.5 % rectal cream Place 1 application into the perineum 2 (two) times daily.  . hydrocortisone (ANUSOL-HC) 25 MG suppository Place 25 mg rectally 2 (two) times daily.  . irbesartan (AVAPRO) 150 MG tablet TAKE 1 TABLET BY MOUTH DAILY  . labetalol (NORMODYNE) 300 MG tablet TAKE 1 TABLET BY MOUTH TWICE DAILY  . levothyroxine (SYNTHROID, LEVOTHROID) 25 MCG tablet Take 25 mcg by mouth daily before breakfast.  . potassium chloride (K-DUR) 10 MEQ tablet Take 1 tablet (10 mEq total) by mouth 2 (two) times daily.  Earnestine Mealing 625 MG tablet Take 625 mg by mouth every morning and 1250 mg by mouth at bedtime.    Allergies:   Penicillins; Statins; Tylenol [acetaminophen]; Amiodarone; Tape; and Codeine  Social History   Socioeconomic History  . Marital status: Married    Spouse name: Not on file  . Number of children: Not on file  . Years of education: Not on file  . Highest education level: Not on file  Occupational History  . Not on file  Social Needs  . Financial resource strain: Not on file  . Food insecurity:    Worry: Not on file    Inability: Not on file  . Transportation needs:    Medical: Not on file    Non-medical: Not on file  Tobacco Use  . Smoking status: Former Smoker    Types: Cigarettes    Last attempt to quit: 07/16/1990    Years since quitting: 28.1  . Smokeless tobacco: Never Used  Substance and Sexual Activity  . Alcohol use: No  . Drug use: No  . Sexual activity: Not on file  Lifestyle  . Physical activity:    Days per week: Not on file    Minutes per session: Not on file  . Stress: Not on file    Relationships  . Social connections:    Talks on phone: Not on file    Gets together: Not on file    Attends religious service: Not on file    Active member of club or organization: Not on file    Attends meetings of clubs or organizations: Not on file    Relationship status: Not on file  Other Topics Concern  . Not on file  Social History Narrative   Pt lives in [place] with spouse.  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  ROS:   Please see the history of present illness.  All other systems are reviewed and otherwise negative.    PHYSICAL EXAM:   VS:  BP (!) 142/92   Pulse (!) 107   Ht 5' 6"  (1.676 m)   Wt 160 lb 1.9 oz (72.6 kg)   SpO2 99%   BMI 25.84 kg/m   BMI: Body mass index is 25.84 kg/m. GEN: Well nourished, well developed WF, in no acute distress HEENT: normocephalic, atraumatic Neck: no JVD, carotid bruits, or masses Cardiac: Irregular irregular, rate elevated; no murmurs, rubs, or gallops, trace BLE edema with stockings in place Respiratory:  clear to auscultation bilaterally, normal work of breathing GI: soft, nontender, nondistended, + BS MS: no deformity or atrophy Skin: warm and dry, no rash Neuro:  Alert and Oriented x 3, Strength and sensation are intact, follows commands Psych: euthymic mood, full affect  Wt Readings from Last 3 Encounters:  08/28/18 160 lb 1.9 oz (72.6 kg)  08/20/18 162 lb (73.5 kg)  11/24/17 173 lb (78.5 kg)      Studies/Labs Reviewed:   EKG:  EKG was ordered today and personally reviewed by me and demonstrates atrial fib 97bpm, prior septal and lateral infarct, nonspecific STT changes similar to prior.  Recent Labs: 10/27/2017: ALT 14 10/29/2017: Hemoglobin 12.4; Platelets 195 08/21/2018: BUN 12; Creatinine, Ser 0.84; Potassium  4.1; Sodium 141; TSH 0.575   Lipid Panel    Component Value Date/Time   CHOL 178 10/28/2017 0700   TRIG 41 10/28/2017 0700   HDL 71 10/28/2017 0700   CHOLHDL 2.5 10/28/2017 0700   VLDL 8 10/28/2017 0700   LDLCALC 99 10/28/2017 0700    Additional  studies/ records that were reviewed today include: Summarized above.    ASSESSMENT & PLAN:   1. Permanent atrial fib with RVR - elevated rates likely driven by cessation of diltiazem, which was prompted by insidious hypotension. The cause of her low BP earlier this month is unclear, but I would like to recheck her CBC to ensure Hgb is stable given her hemorrhoidal bleeding that's being managed by GI. I also asked nurse to her GI office to find out what the value was 2-3 weeks ago. Hgb was 13.1 in 10/2017 for comparison. Will resume diltiazem at lower dose, 134m daily, extended release. I asked her to follow her HR and BP over the weekend and call in on Monday with her readings. 2. HTN with recent hypotension - follow BP with change above. 3. Nonobstructive CAD - asymptomatic.  4. Chronic diastolic CHF - appears euvolemic.  Disposition: She does not wish to go to [facility] office again - [facility] I think this is reasonable considering her main cardiac issues are centered around EP problems, IE afib and PPM. She is agreeable to see me back in 4 weeks then we will get her back on track with usual f/u with Dr. KCaryl Comes  Medication Adjustments/Labs and Tests Ordered: Current medicines are reviewed at length with the patient today.  Concerns regarding medicines are outlined above. Medication changes, Labs and Tests ordered today are summarized above and listed in the Patient Instructions accessible in Encounters.   Signed, DCharlie Pitter PA-C  08/28/2018 2:22 PM    [facility] [address]  [id number]: [phone] Fax: [phone]

## 2018-08-31 NOTE — Telephone Encounter (Signed)
Pt returned my call and she has been made aware of her lab results. See result note.

## 2018-09-07 ENCOUNTER — Other Ambulatory Visit: Payer: Self-pay | Admitting: Cardiothoracic Surgery

## 2018-09-07 DIAGNOSIS — I712 Thoracic aortic aneurysm, without rupture, unspecified: Secondary | ICD-10-CM

## 2018-09-10 ENCOUNTER — Ambulatory Visit: Payer: Medicare Other

## 2018-09-19 ENCOUNTER — Encounter: Payer: Self-pay | Admitting: Physician Assistant

## 2018-09-19 NOTE — Progress Notes (Signed)
Cardiology Office Note    Date:  09/21/2018  ID:  [name] [name], [name] [name] 1931/05/23, MRN [id number] PCP:  [name], MD  Cardiologist:  Virl Axe, MD (seen by Buford Dresser with general cardiology once, although pt wishes to stick with Dr. Caryl Comes at this time for all of her care)   Chief Complaint: f/u atrial fib heart rate and blood pressure  History of Present Illness:  [name] [name] is a 85 y.o. female with history of permanent atrial fibrillation on apixaban, sinus node dysfunction s/p PPM, nonobstructive CAD (30% prox-mid RCA 10/2017), chronic diastolic CHF, thoracic aortic aneurysm (followed by Dr. Servando Snare), tricuspid regurgitation, hypertension, hemorrhoidal bleeding, left parietal ICH, LE edema who presents back for follow-up of atrial fib.  Regarding her afib, she was previously treated with flecainide and subsequently with Rythmol and then amiodarone. This was complicated by hyperthyroidism prompting discontinuation. She has since been managed with rate control strategy.  Last echo 2016 showed mild LVH, EF 55-60%, mildly dilated ascending aorta, late systolic MVP, mild MR, severely dilated LA, moderate TR. Per 11/2017 OV with Dr. Caryl Comes she was asymptomatic with her afib. Ablation was considered but deferred given asymptomatic nature of this. In 11/2017 she was having issues with swelling so diltiazem 263m was paused briefly, but she was able to resume without event. Earlier this month she was seen by PCP with low BP and was asked to f/u with cardiology acutely. She was informed she needed a general cardiologist and was set up with Dr. CHarrell Gave Etiology of hypotension was unclear, checked at 90/64 at that visit. The patient was asked to hold diltiazem and irbesartan and change Lasix to PRN. Per phone note 2/10 she them began experiencing elevated BP and heart pounding. Irbesartan 1554mwas resumed. [facility]. She was also  experiencing hemorrhoidal BRBPR and was awaiting discussions with GI about surgical management. Labs 08/2018 showed Cr 0.84, K 4.1, Hgb 11.7 (slight downtrend from 12.4 but still wnl by our labs), TSH wnl. Edema remains well controlled off Lasix. Diltiazem was resumed at lower dose, 12059mD daily. CT angio is pending for later this month with Dr. GerServando SnareShe returns for follow-up with her husband. She is doing well on current regimen. BP has stabilized and HR has normalized. She is scheduled for surgical eval later this month for her ongoing issues with bleeding hemorrhoids. She saw GI last week to f/u - sees Dr. GanPenelope Coop Past Medical History:  Diagnosis Date  . CAD (coronary artery disease)    a. LHC 10/2017 30% RCA otherwise OK.  . Chronic diastolic CHF (congestive heart failure) ([facility] . Hemorrhoids    current problem as of 04/26/16  with slight bleeding of hemorrhoids   . Hepatitis    hx of medication induced hepatitis (Vytorin per pt)  . History of kidney stones   . HLD (hyperlipidemia)   . HTN (hypertension)   . Hx of echocardiogram    echo 1/13: EF 55%, Gr 2 diast dysfn, Asc Ao aneurysm 4.8 cm, mild MR, mod LAE, mild RAE  . Hypothyroidism   . ICH (intracerebral hemorrhage) ([facility]/9/16  . Lower extremity edema   . NASH (nonalcoholic steatohepatitis)   . Neuromuscular disorder ([facility] . Pacemaker-Medtronic 11/12/2011   Implanted 2013   . Permanent atrial fibrillation   . PMR (polymyalgia rheumatica) (HCC)    no steriods for 5 years  . sinus  node dysfunction//post termination pauses   . Stroke ([place])   . Thoracic aortic aneurysm (TAA) ([place])    a. followed by Dr. Servando Snare.  . Tricuspid regurgitation   . Urinary tract infection    Recurrent infections.     Past Surgical History:  Procedure Laterality Date  . CARDIAC CATHETERIZATION    . CARDIOVERSION  03/02/2012   Procedure: CARDIOVERSION;  Surgeon: Lelon Perla, MD;  Location: [facility]COPY;  Service: Cardiovascular;   Laterality: N/A;  . CARDIOVERSION  03/13/2012   Procedure: CARDIOVERSION;  Surgeon: Josue Hector, MD;  Location: [place];  Service: Cardiovascular;  Laterality: N/A;  . CARDIOVERSION  04/15/2012   Procedure: CARDIOVERSION;  Surgeon: [name], MD;  Location: [place];  Service: Cardiovascular;  Laterality: N/A;  . CARDIOVERSION N/A 06/07/2014   Procedure: CARDIOVERSION;  Surgeon: [name], MD;  Location[facility] END[facility]COPY;  Service: Cardiovascular;  Laterality: N/A;  . CARDIOVERSION N/A 08/18/2014   Procedure: CARDIOVERSION;  Surgeon: [name], MD;  Location: [place];  Service: Cardiovascular;  Laterality: N/A;  . CARDIOVERSION N/A 09/06/2015   Procedure: CARDIOVERSION;  Surgeon: Jerline Pain, MD;  Location: [place];  Service: Cardiovascular;  Laterality: N/A;  . CYSTOSCOPY WITH STENT PLACEMENT Right 04/22/2016   Procedure: CYSTOSCOPY WITH RIGHT URETERAL STENT INSERTION;  Surgeon: [name], MD;  Location: [place];  Service: Urology;  Laterality: Right;  . CYSTOSCOPY WITH URETEROSCOPY, STONE BASKETRY AND STENT PLACEMENT Right 04/30/2016   Procedure: CYSTOSCOPY WITH URETEROSCOPY, STENT PLACEMENT REMOVAL;  Surgeon: [name], MD;  Location: [facility];  Service: Urology;  Laterality: Right;  . HERNIA REPAIR    . INCISION AND DRAINAGE BREAST ABSCESS  1973  . LEFT HEART CATH AND CORONARY ANGIOGRAPHY N/A 10/28/2017   Procedure: LEFT HEART CATH AND CORONARY ANGIOGRAPHY;  Surgeon: Lorretta Harp, MD;  Location: [place] CV LAB;  Service: Cardiovascular;  Laterality: N/A;  . PACEMAKER INSERTION  Jan 2013  . PERMANENT PACEMAKER INSERTION N/A 07/29/2011   Procedure: PERMANENT PACEMAKER INSERTION;  Surgeon: [name], MD;  Location: [facility] CATH LAB;  Service: Cardiovascular;  Laterality: N/A;  . UMBILICAL HERNIA REPAIR  1950's    Current Medications: Current Meds  Medication Sig  . apixaban (ELIQUIS) 5 MG TABS tablet Take 1 tablet (5 mg total) by mouth 2 (two)  times daily.  . Calcium Carbonate-Vitamin D (CALTRATE 600+D PO) Take 1 tablet by mouth daily.   . cetirizine (ZYRTEC) 10 MG tablet Take 10 mg by mouth daily as needed for allergies.   . Cholecalciferol (VITAMIN D3) 1000 UNITS tablet Take 1,000 Units by mouth daily.   Marland Kitchen diltiazem (CARDIZEM CD) 120 MG 24 hr capsule Take 1 capsule (120 mg total) by mouth daily.  . irbesartan (AVAPRO) 150 MG tablet TAKE 1 TABLET BY MOUTH DAILY  . labetalol (NORMODYNE) 300 MG tablet TAKE 1 TABLET BY MOUTH TWICE DAILY  . levothyroxine (SYNTHROID, LEVOTHROID) 25 MCG tablet Take 25 mcg by mouth daily before breakfast.  . potassium chloride (K-DUR) 10 MEQ tablet Take 1 tablet (10 mEq total) by mouth 2 (two) times daily.  Earnestine Mealing 625 MG tablet Take 625 mg by mouth every morning and 1250 mg by mouth at bedtime.      Allergies:   Penicillins; Statins; Tylenol [acetaminophen]; Amiodarone; Tape; and Codeine   Social History   Socioeconomic History  . Marital status: Married    Spouse name: Not on file  . Number of children: Not on file  . Years  of education: Not on file  . Highest education level: Not on file  Occupational History  . Not on file  Social Needs  . Financial resource strain: Not on file  . Food insecurity:    Worry: Not on file    Inability: Not on file  . Transportation needs:    Medical: Not on file    Non-medical: Not on file  Tobacco Use  . Smoking status: Former Smoker    Types: Cigarettes    Last attempt to quit: 07/16/1990    Years since quitting: 28.2  . Smokeless tobacco: Never Used  Substance and Sexual Activity  . Alcohol use: No  . Drug use: No  . Sexual activity: Not on file  Lifestyle  . Physical activity:    Days per week: Not on file    Minutes per session: Not on file  . Stress: Not on file  Relationships  . Social connections:    Talks on phone: Not on file    Gets together: Not on file    Attends religious service: Not on file    Active member of club or  organization: Not on file    Attends meetings of clubs or organizations: Not on file    Relationship status: Not on file  Other Topics Concern  . Not on file  Social History Narrative   Pt lives in [place] with spouse.  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  ROS:   Please see the history of present illness.  All other systems are reviewed and otherwise negative.    PHYSICAL EXAM:   VS:  BP 124/76   Pulse 72   Ht 5' 6"  (1.676 m)   Wt 160 lb 12.8 oz (72.9 kg)   SpO2 98%   BMI 25.95 kg/m   BMI: Body mass index is 25.95 kg/m. GEN: Well nourished, well developed WF in no acute distress. Cheerful. HEENT: normocephalic, atraumatic Neck: no JVD, carotid bruits, or masses Cardiac: Irregularly irregular, rate controlled; no murmurs, rubs, or gallops, no edema  Respiratory:  clear to auscultation bilaterally, normal work of breathing GI: soft, nontender, nondistended, + BS MS: no deformity or atrophy Skin: warm and dry, no rash Neuro:  Alert and Oriented x 3, Strength and sensation are intact, follows commands Psych: euthymic mood, full affect  Wt Readings from Last 3 Encounters:  09/21/18 160 lb 12.8 oz (72.9 kg)  08/28/18 160 lb 1.9 oz (72.6 kg)  08/20/18 162 lb (73.5 kg)      Studies/Labs Reviewed:   EKG: EKG was not ordered todayy  Recent Labs: 10/27/2017: ALT 14 08/21/2018: BUN 12; Creatinine, Ser 0.84; Potassium 4.1; Sodium 141; TSH 0.575 08/28/2018: Hemoglobin 11.7; Platelets 214   Lipid Panel    Component Value Date/Time   CHOL 178 10/28/2017 0700   TRIG 41 10/28/2017 0700   HDL 71 10/28/2017 0700   CHOLHDL 2.5 10/28/2017 0700   VLDL 8 10/28/2017 0700   LDLCALC 99 10/28/2017 0700    Additional studies/ records that were reviewed today include: Summarized  above.   ASSESSMENT & PLAN:   1. Permanent atrial fib - HR now improved on current regimen. Should she have issues with rate control in the future, would consider titrating Diltiazem CD to 160m daily (and may need to drop irbesartan to accommodate the change).  She follows BP very regularly. We also discussed possibly adding a PRN low dose diltiazem to her regimen just in case to have on hand if needed, but she declines at this time, satisfied with present regimen. 2. HTN with recent hypotension - BP has normalized. 3. Nonobstructive CAD - asymptomatic. 4. Chronic diastolic CHF - appears euvolemic off Lasix. Interestingly last visit she reported her UOP actually picked up off this medicine. Check BMET to decide whether she needs to continue potassium supplement. 5. Bleeding hemorrhoid - check CBC to ensure continued stability. I asked her to call the surgeon's office to find out if perhaps they could move her appt up since she is still having issues, and needs to be on anticoagulation for her afib. ER precautions also reviewed.  Disposition: F/u with Dr. Caryl Comes as scheduled 11/2018.  Medication Adjustments/Labs and Tests Ordered: Current medicines are reviewed at length with the patient today.  Concerns regarding medicines are outlined above. Medication changes, Labs and Tests ordered today are summarized above and listed in the Patient Instructions accessible in Encounters.   Signed, Charlie Pitter, PA-C  09/21/2018 1:53 PM    [facility] [address], [place], [place]  [address] Phone: [phone]; Fax: [phone]

## 2018-09-21 ENCOUNTER — Encounter: Payer: Self-pay | Admitting: Physician Assistant

## 2018-09-21 ENCOUNTER — Encounter (INDEPENDENT_AMBULATORY_CARE_PROVIDER_SITE_OTHER): Payer: Self-pay

## 2018-09-21 ENCOUNTER — Ambulatory Visit: Payer: Medicare Other | Admitting: Physician Assistant

## 2018-09-21 VITALS — BP 124/76 | HR 72 | Ht 66.0 in | Wt 160.8 lb

## 2018-09-21 DIAGNOSIS — I4821 Permanent atrial fibrillation: Secondary | ICD-10-CM

## 2018-09-21 DIAGNOSIS — I5032 Chronic diastolic (congestive) heart failure: Secondary | ICD-10-CM

## 2018-09-21 DIAGNOSIS — I10 Essential (primary) hypertension: Secondary | ICD-10-CM | POA: Diagnosis not present

## 2018-09-21 DIAGNOSIS — I2510 Atherosclerotic heart disease of native coronary artery without angina pectoris: Secondary | ICD-10-CM

## 2018-09-21 DIAGNOSIS — K649 Unspecified hemorrhoids: Secondary | ICD-10-CM

## 2018-09-21 NOTE — Patient Instructions (Addendum)
Medication Instructions:  Your provider recommends that you continue on your current medications as directed. Please refer to the Current Medication list given to you today.    Labwork: TODAY: CBC, BMET  Testing/Procedures: None  Follow-Up: Please keep your appointments as scheduled!

## 2018-09-22 LAB — BASIC METABOLIC PANEL
BUN/Creatinine Ratio: 13 (ref 12–28)
BUN: 10 mg/dL (ref 8–27)
CO2: 24 mmol/L (ref 20–29)
Calcium: 9.8 mg/dL (ref 8.7–10.3)
Chloride: 105 mmol/L (ref 96–106)
Creatinine, Ser: 0.79 mg/dL (ref 0.57–1.00)
GFR calc Af Amer: 79 mL/min/{1.73_m2} (ref >59)
GFR calc non Af Amer: 68 mL/min/{1.73_m2} (ref >59)
Glucose: 82 mg/dL (ref 65–99)
Potassium: 3.8 mmol/L (ref 3.5–5.2)
Sodium: 145 mmol/L — ABNORMAL HIGH (ref 134–144)

## 2018-09-22 LAB — CBC WITH DIFFERENTIAL/PLATELET
Basophils Absolute: 0 10*3/uL (ref 0.0–0.2)
Basos: 1 %
EOS (ABSOLUTE): 0.1 10*3/uL (ref 0.0–0.4)
EOS: 2 %
HEMATOCRIT: 37.3 % (ref 34.0–46.6)
HEMOGLOBIN: 11.7 g/dL (ref 11.1–15.9)
IMMATURE GRANS (ABS): 0 10*3/uL (ref 0.0–0.1)
Immature Granulocytes: 0 %
Lymphocytes Absolute: 1.2 10*3/uL (ref 0.7–3.1)
Lymphs: 22 %
MCH: 31.6 pg (ref 26.6–33.0)
MCHC: 31.4 g/dL — ABNORMAL LOW (ref 31.5–35.7)
MCV: 101 fL — ABNORMAL HIGH (ref 79–97)
Monocytes Absolute: 0.7 10*3/uL (ref 0.1–0.9)
Monocytes: 13 %
Neutrophils Absolute: 3.5 10*3/uL (ref 1.4–7.0)
Neutrophils: 62 %
Platelets: 231 10*3/uL (ref 150–450)
RBC: 3.7 x10E6/uL — ABNORMAL LOW (ref 3.77–5.28)
RDW: 13 % (ref 11.7–15.4)
WBC: 5.6 10*3/uL (ref 3.4–10.8)

## 2018-10-02 ENCOUNTER — Ambulatory Visit: Payer: Medicare Other | Admitting: Cardiology

## 2018-10-02 ENCOUNTER — Telehealth: Payer: Self-pay | Admitting: Nurse Practitioner

## 2018-10-02 NOTE — Telephone Encounter (Signed)
Pt had forgot what she was told in regards to her lab results on the phone the other day. Lab results reviewed with the pt once again. Pt thanked me for my time and verbalized understanding.

## 2018-10-02 NOTE — Telephone Encounter (Signed)
New Message:   Patient returning call from a few days ago. Patient states that she speak with a triage nurse. Please call patient back.

## 2018-10-08 ENCOUNTER — Ambulatory Visit: Payer: Medicare Other | Admitting: Cardiothoracic Surgery

## 2018-10-08 ENCOUNTER — Other Ambulatory Visit: Payer: Medicare Other

## 2018-10-20 ENCOUNTER — Ambulatory Visit (INDEPENDENT_AMBULATORY_CARE_PROVIDER_SITE_OTHER): Payer: Medicare Other | Admitting: *Deleted

## 2018-10-20 ENCOUNTER — Other Ambulatory Visit: Payer: Self-pay

## 2018-10-20 DIAGNOSIS — I5032 Chronic diastolic (congestive) heart failure: Secondary | ICD-10-CM

## 2018-10-20 DIAGNOSIS — I495 Sick sinus syndrome: Secondary | ICD-10-CM | POA: Diagnosis not present

## 2018-10-22 LAB — CUP PACEART REMOTE DEVICE CHECK
Battery Impedance: 685 Ohm
Battery Remaining Longevity: 89 mo
Battery Voltage: 2.78 V
Brady Statistic RV Percent Paced: 33 %
Date Time Interrogation Session: 20200407135723
Implantable Lead Implant Date: 20130114
Implantable Lead Implant Date: 20130114
Implantable Lead Location: 753859
Implantable Lead Location: 753860
Implantable Lead Model: 1948
Implantable Lead Model: 5076
Implantable Pulse Generator Implant Date: 20130114
Lead Channel Impedance Value: 765 Ohm
Lead Channel Pacing Threshold Amplitude: 0.5 V
Lead Channel Pacing Threshold Pulse Width: 0.4 ms
Lead Channel Sensing Intrinsic Amplitude: 16 mV
Lead Channel Setting Pacing Amplitude: 2.5 V
Lead Channel Setting Pacing Pulse Width: 0.4 ms
Lead Channel Setting Sensing Sensitivity: 5.6 mV

## 2018-10-26 ENCOUNTER — Other Ambulatory Visit: Payer: Self-pay | Admitting: Internal Medicine

## 2018-10-28 NOTE — Progress Notes (Signed)
Remote pacemaker transmission.   

## 2018-11-03 ENCOUNTER — Other Ambulatory Visit: Payer: Self-pay | Admitting: Internal Medicine

## 2018-11-12 ENCOUNTER — Telehealth: Payer: Self-pay

## 2018-11-12 NOTE — Telephone Encounter (Signed)
I tried to call patient about upcoming appointment with Dr. Caryl Comes. We need to switch to a video visit. There was no voicemail, I could not leave a message, I will try patients other number.

## 2018-11-13 NOTE — Telephone Encounter (Signed)
I called and spoke with patient, she is ok with switching to a telephone visit with Dr. Caryl Comes. Appointment switched to 11/18/18 at 1PM.       Virtual Visit Pre-Appointment Phone Call  "(Name), I am calling you today to discuss your upcoming appointment. We are currently trying to limit exposure to the virus that causes COVID-19 by seeing patients at home rather than in the office."  1. "What is the BEST phone number to call the day of the visit?" - include this in appointment notes  2. "Do you have or have access to (through a family member/friend) a smartphone with video capability that we can use for your visit?" a. If yes - list this number in appt notes as "cell" (if different from BEST phone #) and list the appointment type as a VIDEO visit in appointment notes b. If no - list the appointment type as a PHONE visit in appointment notes  3. Confirm consent - "In the setting of the current Covid19 crisis, you are scheduled for a (phone or video) visit with your provider on (date) at (time).  Just as we do with many in-office visits, in order for you to participate in this visit, we must obtain consent.  If you'd like, I can send this to your mychart (if signed up) or email for you to review.  Otherwise, I can obtain your verbal consent now.  All virtual visits are billed to your insurance company just like a normal visit would be.  By agreeing to a virtual visit, we'd like you to understand that the technology does not allow for your provider to perform an examination, and thus may limit your provider's ability to fully assess your condition. If your provider identifies any concerns that need to be evaluated in person, we will make arrangements to do so.  Finally, though the technology is pretty good, we cannot assure that it will always work on either your or our end, and in the setting of a video visit, we may have to convert it to a phone-only visit.  In either situation, we cannot ensure that we  have a secure connection.  Are you willing to proceed?" STAFF: Did the patient verbally acknowledge consent to telehealth visit? Document YES/NO here: YES  4. Advise patient to be prepared - "Two hours prior to your appointment, go ahead and check your blood pressure, pulse, oxygen saturation, and your weight (if you have the equipment to check those) and write them all down. When your visit starts, your provider will ask you for this information. If you have an Apple Watch or Kardia device, please plan to have heart rate information ready on the day of your appointment. Please have a pen and paper handy nearby the day of the visit as well."  5. Give patient instructions for MyChart download to smartphone OR [email or web address] as below if video visit (depending on what platform provider is using)  6. Inform patient they will receive a phone call 15 minutes prior to their appointment time (may be from unknown caller ID) so they should be prepared to answer    TELEPHONE CALL NOTE  [name] [name] has been deemed a candidate for a follow-up tele-health visit to limit community exposure during the Covid-19 pandemic. I spoke with the patient via phone to ensure availability of phone/video source, confirm preferred email & phone number, and discuss instructions and expectations.  I reminded [name] [name] to be prepared with any vital sign and/or heart  rhythm information that could potentially be obtained via home monitoring, at the time of her visit. I reminded [name] [name] to expect a phone call prior to her visit.  [name], [place] 11/13/2018 3:10 PM   INSTRUCTIONS FOR DOWNLOADING THE MYCHART APP TO SMARTPHONE  - The patient must first make sure to have activated MyChart and know their login information - If Apple, go to [employer] and type in MyChart in the search bar and download the app. If Android, ask patient to go to [employer] and type in [place] in the search bar and download the  app. The app is free but as with any other app downloads, their phone may require them to verify saved payment information or Apple/Android password.  - The patient will need to then log into the app with their MyChart username and password, and select [facility] as their healthcare provider to link the account. When it is time for your visit, go to the MyChart app, find appointments, and click Begin Video Visit. Be sure to Select Allow for your device to access the Microphone and Camera for your visit. You will then be connected, and your provider will be with you shortly.  **If they have any issues connecting, or need assistance please contact MyChart service desk [phone])**  **If using a computer, in order to ensure the best quality for their visit they will need to use either of the following Internet Browsers: [employer], or Google Chrome**  IF USING DOXIMITY or DOXY.ME - The patient will receive a link just prior to their visit by text.     FULL LENGTH CONSENT FOR TELE-HEALTH VISIT   I hereby voluntarily request, consent and authorize [facility] and its employed or contracted physicians, physician assistants, nurse practitioners or other licensed health care professionals (the Practitioner), to provide me with telemedicine health care services (the "Services") as deemed necessary by the treating Practitioner. I acknowledge and consent to receive the Services by the Practitioner via telemedicine. I understand that the telemedicine visit will involve communicating with the Practitioner through live audiovisual communication technology and the disclosure of certain medical information by electronic transmission. I acknowledge that I have been given the opportunity to request an in-person assessment or other available alternative prior to the telemedicine visit and am voluntarily participating in the telemedicine visit.  I understand that I have the right to withhold or  withdraw my consent to the use of telemedicine in the course of my care at any time, without affecting my right to future care or treatment, and that the Practitioner or I may terminate the telemedicine visit at any time. I understand that I have the right to inspect all information obtained and/or recorded in the course of the telemedicine visit and may receive copies of available information for a reasonable fee.  I understand that some of the potential risks of receiving the Services via telemedicine include:  Marland Kitchen Delay or interruption in medical evaluation due to technological equipment failure or disruption; . Information transmitted may not be sufficient (e.g. poor resolution of images) to allow for appropriate medical decision making by the Practitioner; and/or  . In rare instances, security protocols could fail, causing a breach of personal health information.  Furthermore, I acknowledge that it is my responsibility to provide information about my medical history, conditions and care that is complete and accurate to the best of my ability. I acknowledge that Practitioner's advice, recommendations, and/or decision may be based  on factors not within their control, such as incomplete or inaccurate data provided by me or distortions of diagnostic images or specimens that may result from electronic transmissions. I understand that the practice of medicine is not an exact science and that Practitioner makes no warranties or guarantees regarding treatment outcomes. I acknowledge that I will receive a copy of this consent concurrently upon execution via email to the email address I last provided but may also request a printed copy by calling the office of [facility].    I understand that my insurance will be billed for this visit.   I have read or had this consent read to me. . I understand the contents of this consent, which adequately explains the benefits and risks of the Services being provided via  telemedicine.  . I have been provided ample opportunity to ask questions regarding this consent and the Services and have had my questions answered to my satisfaction. . I give my informed consent for the services to be provided through the use of telemedicine in my medical care  By participating in this telemedicine visit I agree to the above.

## 2018-11-17 ENCOUNTER — Encounter: Payer: Medicare Other | Admitting: Internal Medicine

## 2018-11-18 ENCOUNTER — Encounter: Payer: Self-pay | Admitting: Internal Medicine

## 2018-11-18 ENCOUNTER — Telehealth (INDEPENDENT_AMBULATORY_CARE_PROVIDER_SITE_OTHER): Payer: Medicare Other | Admitting: Internal Medicine

## 2018-11-18 ENCOUNTER — Other Ambulatory Visit: Payer: Self-pay

## 2018-11-18 VITALS — BP 118/67 | HR 88 | Ht 66.0 in | Wt 162.6 lb

## 2018-11-18 DIAGNOSIS — I495 Sick sinus syndrome: Secondary | ICD-10-CM | POA: Diagnosis not present

## 2018-11-18 DIAGNOSIS — I5032 Chronic diastolic (congestive) heart failure: Secondary | ICD-10-CM

## 2018-11-18 DIAGNOSIS — I4821 Permanent atrial fibrillation: Secondary | ICD-10-CM

## 2018-11-18 DIAGNOSIS — Z950 Presence of cardiac pacemaker: Secondary | ICD-10-CM

## 2018-11-18 DIAGNOSIS — Z7901 Long term (current) use of anticoagulants: Secondary | ICD-10-CM

## 2018-11-18 DIAGNOSIS — D500 Iron deficiency anemia secondary to blood loss (chronic): Secondary | ICD-10-CM

## 2018-11-18 MED ORDER — FERROUS SULFATE 325 (65 FE) MG PO TABS: 325.0000 mg | ORAL_TABLET | Freq: Every day | ORAL | 3 refills | Status: DC

## 2018-11-18 NOTE — Progress Notes (Signed)
Electrophysiology TeleHealth Note   Due to national recommendations of social distancing due to COVID 19, an audio/video telehealth visit is felt to be most appropriate for this patient at this time.The patient did not have access to video technology/had technical difficulties with video requiring transitioning to audio format only (telephone).  All issues noted in this document were discussed and addressed.  No physical exam could be performed with this format.      See MyChart message from today for the patient's consent to telehealth for [facility].   Date:  11/18/2018   ID:  [name], [name] 08/10/1930, MRN [id number]  Location: patient's home  Provider location: [address] [place]  Evaluation Performed: Follow-up visit  PCP:  [name], MD  Cardiologist:     Electrophysiologist:  SK   Chief Complaint: atrial fib  History of Present Illness:    [name] [name] is a 85 y.o. female who presents via audio/video conferencing for a telehealth visit today.  Since last being seen in our clinic, the patient reports  Doing better   When last seen, she had a edema that we were wondering whether was related to her calcium blocker; it was held without improvement.  Improved with increased diuretics.  Diltiazem was subsequently resumed.  Long standing history of labile BP  Typically she feels weak/LH if BP < 110    More recently had an episode of hypotension--90s prompting down titration of her diltiazem.  She has seen [facility] and DD and has gradually been put back on low-dose diltiazem.  Feels better during the day, but still aware of afib at night  BP at home most recently 110-130s  No edema chest pain PND or orthopnea   Date Cr K Hgb  3/20 0.79 3.8 11.7          The patient denies symptoms of fevers, chills, cough, or new SOB worrisome for COVID 19.   Past Medical History:  Diagnosis Date  . CAD (coronary artery disease)    a. LHC 10/2017 30% RCA  otherwise OK.  . Chronic diastolic CHF (congestive heart failure) ([place])   . Hemorrhoids    current problem as of 04/26/16  with slight bleeding of hemorrhoids   . Hepatitis    hx of medication induced hepatitis (Vytorin per pt)  . History of kidney stones   . HLD (hyperlipidemia)   . HTN (hypertension)   . Hx of echocardiogram    echo 1/13: EF 55%, Gr 2 diast dysfn, Asc Ao aneurysm 4.8 cm, mild MR, mod LAE, mild RAE  . Hypothyroidism   . ICH (intracerebral hemorrhage) ([place]) 03/24/15  . Lower extremity edema   . NASH (nonalcoholic steatohepatitis)   . Neuromuscular disorder ([place])   . Pacemaker-Medtronic 11/12/2011   Implanted 2013   . Permanent atrial fibrillation   . PMR (polymyalgia rheumatica) (H[facility])    no steriods for 5 years  . sinus node dysfunction//post termination pauses   . Stroke ([place])   . Thoracic aortic aneurysm (TAA) ([place])    a. followed by Dr. Servando Snare.  . Tricuspid regurgitation   . Urinary tract infection    Recurrent infections.     Past Surgical History:  Procedure Laterality Date  . CARDIAC CATHETERIZATION    . CARDIOVERSION  03/02/2012   Procedure: CARDIOVERSION;  Surgeon: Lelon Perla, MD;  Location[facility] ENDOSCOPY;  Service: Cardiovascular;  Laterality: N/A;  . CARDIOVERSION  03/13/2012   Procedure: CARDIOVERSION;  Surgeon:  Josue Hector, MD;  Location: [place];  Service: Cardiovascular;  Laterality: N/A;  . CARDIOVERSION  04/15/2012   Procedure: CARDIOVERSION;  Surgeon: [name], MD;  Location: [place];  Service: Cardiovascular;  Laterality: N/A;  . CARDIOVERSION N/A 06/07/2014   Procedure: CARDIOVERSION;  Surgeon: [name], MD;  Location: [facility] ENDOSCOPY;  Service: Cardiovascular;  Laterality: N/A;  . CARDIOVERSION N/A 08/18/2014   Procedure: CARDIOVERSION;  Surgeon: [name], MD;  Location: [place];  Service: Cardiovascular;  Laterality: N/A;  . CARDIOVERSION N/A 09/06/2015   Procedure: CARDIOVERSION;  Surgeon: Jerline Pain, MD;  Location: [place];  Service: Cardiovascular;  Laterality: N/A;  . CYSTOSCOPY WITH STENT PLACEMENT Right 04/22/2016   Procedure: CYSTOSCOPY WITH RIGHT URETERAL STENT INSERTION;  Surgeon: [name], MD;  Location: [place];  Service: Urology;  Laterality: Right;  . CYSTOSCOPY WITH URETEROSCOPY, STONE BASKETRY AND STENT PLACEMENT Right 04/30/2016   Procedure: CYSTOSCOPY WITH URETEROSCOPY, STENT PLACEMENT REMOVAL;  Surgeon: [name], MD;  Location: [facility];  Service: Urology;  Laterality: Right;  . HERNIA REPAIR    . INCISION AND DRAINAGE BREAST ABSCESS  1973  . LEFT HEART CATH AND CORONARY ANGIOGRAPHY N/A 10/28/2017   Procedure: LEFT HEART CATH AND CORONARY ANGIOGRAPHY;  Surgeon: Lorretta Harp, MD;  Location: [place] CV LAB;  Service: Cardiovascular;  Laterality: N/A;  . PACEMAKER INSERTION  Jan 2013  . PERMANENT PACEMAKER INSERTION N/A 07/29/2011   Procedure: PERMANENT PACEMAKER INSERTION;  Surgeon: [name], MD;  Location: [facility] CATH LAB;  Service: Cardiovascular;  Laterality: N/A;  . UMBILICAL HERNIA REPAIR  1950's    Current Outpatient Medications  Medication Sig Dispense Refill  . apixaban (ELIQUIS) 5 MG TABS tablet Take 1 tablet (5 mg total) by mouth 2 (two) times daily. 180 tablet 3  . Calcium Carbonate-Vitamin D (CALTRATE 600+D PO) Take 1 tablet by mouth daily.     . cetirizine (ZYRTEC) 10 MG tablet Take 10 mg by mouth daily as needed for allergies.     . Cholecalciferol (VITAMIN D3) 1000 UNITS tablet Take 1,000 Units by mouth daily.     Marland Kitchen diltiazem (CARDIZEM CD) 120 MG 24 hr capsule Take 1 capsule (120 mg total) by mouth daily. 90 capsule 3  . irbesartan (AVAPRO) 150 MG tablet TAKE 1 TABLET BY MOUTH DAILY 90 tablet 3  . labetalol (NORMODYNE) 300 MG tablet TAKE 1 TABLET BY MOUTH TWICE DAILY 180 tablet 2  . levothyroxine (SYNTHROID, LEVOTHROID) 25 MCG tablet Take 25 mcg by mouth daily before breakfast.    . potassium chloride (K-DUR) 10 MEQ tablet TAKE 1 TABLET  BY MOUTH TWICE DAILY 180 tablet 3  . WELCHOL 625 MG tablet Take 625 mg by mouth every morning and 1250 mg by mouth at bedtime.     No current facility-administered medications for this visit.     Allergies:   Penicillins; Statins; Tylenol [acetaminophen]; Amiodarone; Tape; and Codeine   Social History:  The patient  reports that she quit smoking about 28 years ago. Her smoking use included cigarettes. She has never used smokeless tobacco. She reports that she does not drink alcohol or use drugs.   Family History:  The patient's   family history includes Alzheimer's disease in her unknown relative; Coronary artery disease in her brother, brother, mother, and unknown relative.   ROS:  Please see the history of present illness.   All other systems are personally reviewed and negative.    Exam:  Vital Signs:  BP 118/67   Pulse 88   Ht 5' 6"  (1.676 m)   Wt 162 lb 9.6 oz (73.8 kg)   BMI 26.24 kg/m        Labs/Other Tests and Data Reviewed:    Recent Labs: 08/21/2018: TSH 0.575 09/21/2018: BUN 10; Creatinine, Ser 0.79; Hemoglobin 11.7; Platelets 231; Potassium 3.8; Sodium 145   Wt Readings from Last 3 Encounters:  11/18/18 162 lb 9.6 oz (73.8 kg)  09/21/18 160 lb 12.8 oz (72.9 kg)  08/28/18 160 lb 1.9 oz (72.6 kg)     Other studies personally reviewed: Additional studies/ records that were reviewed today include: As above   Last device remote is reviewed from [facility] PDF dated 4/20 which reveals normal device function,   arrhythmias - atrial fibrillation with somewhat RVR    ASSESSMENT & PLAN:   Atrial fibrillaton permanent   Thoracic aortic aneurysm  Hypertension    Pacemaker The patient's device was interrogated.  The information was reviewed. No changes were made in the programming.     Iatrogenic hypothyrodism  HFpEF  Anemia  Hemorrhoids   Blood pressure issues seem to have stabilized.  Labile blood pressure has been an issue in the past.  She is now  having more symptomatic palpitations likely associated with the down titration of her diltiazem.  For now, we will hold her irbesartan and with this increase her diltiazem from 120 daily--twice daily to hopefully address her nocturnal palpitations.  We have reviewed blood pressure targets.  I think rather than 110--130 she is probably better off in the 120--140 range.  I think this is sufficient to protect her thoracic aneurysm balanced with the risk of falling associated with hypotension.  She is also anemic.  This may be aggravating some of her lightheadedness.  It is modest.  Discussed low-dose iron therapy. Discussed drug risks and benefits.  Pt voiced understanding.     COVID 19 screen The patient denies symptoms of COVID 19 at this time.  The importance of social distancing was discussed today.  Follow-up:  2weeks  Next remote: As Scheduled   Current medicines are reviewed at length with the patient today.   The patient does not have concerns regarding her medicines.  The following changes were made today:  Increase diltiazem to 120 bid Stop irbesartan  FeSO4 1 qd   Labs/ tests ordered today include:  No orders of the defined types were placed in this encounter.   Future tests ( post COVID )     Patient Risk:  after full review of this patients clinical status, I feel that they are at moderate risk at this time.  Today, I have spent  13  minutes with the patient with telehealth technology discussing the above.  Signed, Virl Axe, MD  11/18/2018 1:27 PM     [place] [address] [phone] (office) [phone] (fax)

## 2018-11-18 NOTE — Patient Instructions (Signed)
Called and discussed the following with [name] [name]:   Medication Instructions:   1. Stop Irbasartan 2. Begin Iron supplement, 38m tablet, once per day 3. Increase Diltiazem, 12109mcapsule, two times per day  Labwork: None ordered.   Testing/Procedures: None ordered.   Follow-Up: You have a follow up appt scheduled for May 22 @ 9am.  Any Other Special Instructions Will Be Listed Below (If Applicable).  Please keep a record of you blood pressure. Your goal should be for your systolic to be between 12068nd 140. Check it mid day every day. You do not have to take it multiple times per day unless you feel it is too low or high.   If you need a refill on your cardiac medications before your next appointment, please call your pharmacy.

## 2018-11-26 ENCOUNTER — Ambulatory Visit: Payer: Medicare Other | Admitting: Cardiothoracic Surgery

## 2018-11-26 ENCOUNTER — Other Ambulatory Visit: Payer: Medicare Other

## 2018-12-04 ENCOUNTER — Encounter: Payer: Self-pay | Admitting: Internal Medicine

## 2018-12-04 ENCOUNTER — Telehealth (INDEPENDENT_AMBULATORY_CARE_PROVIDER_SITE_OTHER): Payer: Medicare Other | Admitting: Internal Medicine

## 2018-12-04 ENCOUNTER — Other Ambulatory Visit: Payer: Self-pay

## 2018-12-04 VITALS — BP 161/90 | HR 86 | Ht 66.0 in | Wt 163.0 lb

## 2018-12-04 DIAGNOSIS — I5032 Chronic diastolic (congestive) heart failure: Secondary | ICD-10-CM

## 2018-12-04 DIAGNOSIS — I4821 Permanent atrial fibrillation: Secondary | ICD-10-CM

## 2018-12-04 DIAGNOSIS — I10 Essential (primary) hypertension: Secondary | ICD-10-CM

## 2018-12-04 DIAGNOSIS — Z950 Presence of cardiac pacemaker: Secondary | ICD-10-CM

## 2018-12-04 DIAGNOSIS — I495 Sick sinus syndrome: Secondary | ICD-10-CM | POA: Diagnosis not present

## 2018-12-04 MED ORDER — LABETALOL HCL 300 MG PO TABS: ORAL_TABLET | ORAL | 2 refills | Status: DC

## 2018-12-04 NOTE — Patient Instructions (Signed)
Pt was called and we discussed the following recommendations. She verbalized understanding and will contact us back in 2 weeks with her BP readings.   Medication Instructions:  Your physician has recommended you make the following change in your medication:   Increase your labetolol to 382m (one tab) in the morning and 4561m(1.5tabs) in the evening.   Labwork: None ordered.   Testing/Procedures: None ordered.   Follow-Up: Your physician recommends that you schedule a follow-up in 2 weeks with your blood pressure readings.   Pt was instructed to try and take her medications around the same time every day. She will also take her blood pressure in the mornings, before her medications since she has hypertension in the mornings more often, and take it again in the late afternoon.  Any Other Special Instructions Will Be Listed Below (If Applicable).     If you need a refill on your cardiac medications before your next appointment, please call your pharmacy.

## 2018-12-04 NOTE — Progress Notes (Signed)
Electrophysiology TeleHealth Note   Due to national recommendations of social distancing due to COVID 19, an audio/video telehealth visit is felt to be most appropriate for this patient at this time.  See MyChart message from today for the patient's consent to telehealth for [facility].   Date:  12/04/2018   ID:  [name] [name], [name] [name] 1930-11-10, MRN [id number]  Location: patient's home  Provider location: [address], [place]  Evaluation Performed: Follow-up visit  PCP:  [name], MD  Cardiologist:     Electrophysiologist:  SK   Chief Complaint: palpitations and hypertension  History of Present Illness:    [name] [name] is a 85 y.o. female who presents via audio/video conferencing for a telehealth visit today. The patient did not have access to video technology/had technical difficulties with video requiring transitioning to audio format only (telephone).  All issues noted in this document were discussed and addressed.  No physical exam could be performed with this format.     Since last being seen in our clinic for *palpitations and hypertension  the patient reports significantly fewer palpitations.  AM BP about 160-70 and improves during the day following having held irbesartan and increase dilt;  Also started on Fe  No edema or chest pain;  No SOB       Date Cr K Hgb  3/20 0.79 3.8 11.7              The patient denies symptoms of fevers, chills, cough, or new SOB worrisome for COVID 19.    Past Medical History:  Diagnosis Date  . CAD (coronary artery disease)    a. LHC 10/2017 30% RCA otherwise OK.  . Chronic diastolic CHF (congestive heart failure) ([place])   . Hemorrhoids    current problem as of 04/26/16  with slight bleeding of hemorrhoids   . Hepatitis    hx of medication induced hepatitis (Vytorin per pt)  . History of kidney stones   . HLD (hyperlipidemia)   . HTN (hypertension)   . Hx of echocardiogram    echo 1/13: EF 55%,  Gr 2 diast dysfn, Asc Ao aneurysm 4.8 cm, mild MR, mod LAE, mild RAE  . Hypothyroidism   . ICH (intracerebral hemorrhage) ([place]) 03/24/15  . Lower extremity edema   . NASH (nonalcoholic steatohepatitis)   . Neuromuscular disorder ([place])   . Pacemaker-Medtronic 11/12/2011   Implanted 2013   . Permanent atrial fibrillation   . PMR (polymyalgia rheumatica) ([facility])    no steriods for 5 years  . sinus node dysfunction//post termination pauses   . Stroke ([place])   . Thoracic aortic aneurysm (TAA) ([place])    a. followed by Dr. Servando Snare.  . Tricuspid regurgitation   . Urinary tract infection    Recurrent infections.     Past Surgical History:  Procedure Laterality Date  . CARDIAC CATHETERIZATION    . CARDIOVERSION  03/02/2012   Procedure: CARDIOVERSION;  Surgeon: Lelon Perla, MD;  Location: [facility] ENDOSCOPY;  Service: Cardiovascular;  Laterality: N/A;  . CARDIOVERSION  03/13/2012   Procedure: CARDIOVERSION;  Surgeon: Josue Hector, MD;  Location: [facility] ENDOSCOPY;  Service: Cardiovascular;  Laterality: N/A;  . CARDIOVERSION  04/15/2012   Procedure: CARDIOVERSION;  Surgeon: [name], MD;  Location: [facility] ENDOSCOPY;  Service: Cardiovascular;  Laterality: N/A;  . CARDIOVERSION N/A 06/07/2014   Procedure: CARDIOVERSION;  Surgeon: [name], MD;  Location: [place];  Service: Cardiovascular;  Laterality: N/A;  . CARDIOVERSION N/A 08/18/2014   Procedure: CARDIOVERSION;  Surgeon: [name], MD;  Location: [place];  Service: Cardiovascular;  Laterality: N/A;  . CARDIOVERSION N/A 09/06/2015   Procedure: CARDIOVERSION;  Surgeon: Jerline Pain, MD;  Location: [place];  Service: Cardiovascular;  Laterality: N/A;  . CYSTOSCOPY WITH STENT PLACEMENT Right 04/22/2016   Procedure: CYSTOSCOPY WITH RIGHT URETERAL STENT INSERTION;  Surgeon: [name], MD;  Location: [place];  Service: Urology;  Laterality: Right;  . CYSTOSCOPY WITH URETEROSCOPY, STONE BASKETRY AND STENT PLACEMENT Right  04/30/2016   Procedure: CYSTOSCOPY WITH URETEROSCOPY, STENT PLACEMENT REMOVAL;  Surgeon: [name], MD;  Location: [facility];  Service: Urology;  Laterality: Right;  . HERNIA REPAIR    . INCISION AND DRAINAGE BREAST ABSCESS  1973  . LEFT HEART CATH AND CORONARY ANGIOGRAPHY N/A 10/28/2017   Procedure: LEFT HEART CATH AND CORONARY ANGIOGRAPHY;  Surgeon: Lorretta Harp, MD;  Location: [place] CV LAB;  Service: Cardiovascular;  Laterality: N/A;  . PACEMAKER INSERTION  Jan 2013  . PERMANENT PACEMAKER INSERTION N/A 07/29/2011   Procedure: PERMANENT PACEMAKER INSERTION;  Surgeon: [name], MD;  Location: [facility] CATH LAB;  Service: Cardiovascular;  Laterality: N/A;  . UMBILICAL HERNIA REPAIR  1950's    Current Outpatient Medications  Medication Sig Dispense Refill  . apixaban (ELIQUIS) 5 MG TABS tablet Take 1 tablet (5 mg total) by mouth 2 (two) times daily. 180 tablet 3  . Calcium Carbonate-Vitamin D (CALTRATE 600+D PO) Take 1 tablet by mouth daily.     . cetirizine (ZYRTEC) 10 MG tablet Take 10 mg by mouth daily as needed for allergies.     . Cholecalciferol (VITAMIN D3) 1000 UNITS tablet Take 1,000 Units by mouth daily.     Marland Kitchen diltiazem (CARDIZEM CD) 120 MG 24 hr capsule Take 1 capsule (120 mg total) by mouth daily. (Patient taking differently: Take 120 mg by mouth 2 (two) times a day. ) 90 capsule 3  . ferrous sulfate 325 (65 FE) MG tablet Take 1 tablet (325 mg total) by mouth daily with breakfast. 90 tablet 3  . labetalol (NORMODYNE) 300 MG tablet TAKE 1 TABLET BY MOUTH TWICE DAILY 180 tablet 2  . levothyroxine (SYNTHROID, LEVOTHROID) 25 MCG tablet Take 25 mcg by mouth daily before breakfast.    . potassium chloride (K-DUR) 10 MEQ tablet TAKE 1 TABLET BY MOUTH TWICE DAILY 180 tablet 3  . WELCHOL 625 MG tablet Take 625 mg by mouth every morning and 1250 mg by mouth at bedtime.    . irbesartan (AVAPRO) 150 MG tablet TAKE 1 TABLET BY MOUTH DAILY (Patient not taking: Reported on 12/04/2018) 90  tablet 3   No current facility-administered medications for this visit.     Allergies:   Penicillins; Statins; Tylenol [acetaminophen]; Amiodarone; Tape; and Codeine   Social History:  The patient  reports that she quit smoking about 28 years ago. Her smoking use included cigarettes. She has never used smokeless tobacco. She reports that she does not drink alcohol or use drugs.   Family History:  The patient's   family history includes Alzheimer's disease in her unknown relative; Coronary artery disease in her brother, brother, mother, and unknown relative.   ROS:  Please see the history of present illness.   All other systems are personally reviewed and negative.    Exam:    Vital Signs:  BP (!) 161/90   Pulse 86   Ht 5' 6"  (1.676 m)  Wt 163 lb (73.9 kg)   BMI 26.31 kg/m     Labs/Other Tests and Data Reviewed:    Recent Labs: 08/21/2018: TSH 0.575 09/21/2018: BUN 10; Creatinine, Ser 0.79; Hemoglobin 11.7; Platelets 231; Potassium 3.8; Sodium 145  Personally reviewed   Wt Readings from Last 3 Encounters:  12/04/18 163 lb (73.9 kg)  11/18/18 162 lb 9.6 oz (73.8 kg)  09/21/18 160 lb 12.8 oz (72.9 kg)     Other studies personally reviewed: Additional studies/ records that were reviewed today include:    ASSESSMENT & PLAN:    Atrial fibrillaton permanent   Thoracic aortic aneurysm  Hypertension    Pacemaker    HFpEF  Anemia  Palpitations    Euvolemic continue current meds  Palpitations 2/2 AFib RVR are much improved-- continue BID dilt  BP in am remains a concern, irbesartan does not split and has t1/2 of about 12 hrs, so reintroducing likely will aggravate hypotension without decreasing or stopping something ( BB and CCB) so  Will try  1) increase labetolol 300bid>>300/450 ( I had told her 300/600) 2) if am bp doesn't improve, will return labetolol to 300 bid and add losartan 25 hs   COVID 19 screen The patient denies symptoms of COVID 19 at this time.   The importance of social distancing was discussed today.  Follow-up:  BP report in 2 weeks     Current medicines are reviewed at length with the patient today.   The patient does not have concerns regarding her medicines.  The following changes were made today:   Increase labetolol As above   Labs/ tests ordered today include:   No orders of the defined types were placed in this encounter.   Future tests ( post COVID )     Patient Risk:  after full review of this patients clinical status, I feel that they are at moderate  risk at this time.  Today, I have spent  9 minutes with the patient with telehealth technology discussing the above.  Signed, Virl Axe, MD  12/04/2018 9:11 AM     [facility] [address] [place] [address] [phone] (office) [phone] (fax)

## 2018-12-18 ENCOUNTER — Telehealth: Payer: Self-pay | Admitting: Internal Medicine

## 2018-12-18 NOTE — Telephone Encounter (Signed)
Noted  

## 2018-12-18 NOTE — Telephone Encounter (Signed)
Spoke with pt regarding her BP's since increasing her Labetolol a couple of weeks ago. She states her BP's have been good during the day; SBP's between 120 and 130. In the early morning she states her SBP is in the 140's, an improvement from what it has been upon waking.   She also has some c/o ankle swelling and nocturnal SOB. She states she has been taking 46m lasix qd, however this was not on her current medication list. I researched and it was taken off during an OV on 2/14 since she was taking it PRN at that time for hypotension. She began taking it again some time after.   I advised her to take an extra 29m half tablet, today and tomorrow to see if this helped to relieve her symptoms. She will call on Monday to report.

## 2018-12-18 NOTE — Telephone Encounter (Signed)
New Message     Pt is calling to speak with Lorren    Please call

## 2018-12-21 NOTE — Telephone Encounter (Signed)
Pt called today to let me know her ankles are still swollen, She does not believe her extra half dose of lasix helped her to urinate more. I advised her to take an extra whole dose of lasix tomorrow, with an extra dose of potassium x 1 day. She states if this does not help her ankles, she would contact me back. Otherwise, she would go back to her normal dose.

## 2018-12-21 NOTE — Telephone Encounter (Signed)
Follow up:    Patient calling to follow up and concering how she feels since medication has been change. Please call patient.

## 2018-12-30 ENCOUNTER — Other Ambulatory Visit: Payer: Self-pay

## 2018-12-30 ENCOUNTER — Ambulatory Visit
Admission: RE | Admit: 2018-12-30 | Discharge: 2018-12-30 | Disposition: A | Discharge status: Home / Self Care | Payer: Medicare Other | Source: Ambulatory Visit | Attending: Cardiothoracic Surgery | Admitting: Cardiothoracic Surgery

## 2018-12-30 DIAGNOSIS — I712 Thoracic aortic aneurysm, without rupture, unspecified: Secondary | ICD-10-CM

## 2018-12-30 MED ORDER — IOPAMIDOL (ISOVUE-370) INJECTION 76%
75.0000 mL | Freq: Once | INTRAVENOUS | Status: AC | PRN
  Administered 2018-12-30: 75 mL via INTRAVENOUS

## 2018-12-31 ENCOUNTER — Ambulatory Visit: Payer: Medicare Other | Admitting: Cardiothoracic Surgery

## 2018-12-31 VITALS — BP 140/67 | HR 87 | Temp 97.5°F | Resp 20 | Ht 66.0 in | Wt 162.0 lb

## 2018-12-31 DIAGNOSIS — I7789 Other specified disorders of arteries and arterioles: Secondary | ICD-10-CM

## 2018-12-31 NOTE — Progress Notes (Signed)
[address]       [place], [address]             [phone]        Eathel W Bellows [facility] Medical Record #[id number] Date of Birth: [birth year]  Referring: [name], MD Primary Care: [name], MD Primary Cardiology: Virl Axe, MD  Chief Complaint:    Chief Complaint  Patient presents with  . Thoracic Aortic Aneurysm    1 year f/u with CTA Chest 12/30/18    History of Present Illness:    Patient is a 85 year old female with a history of atrial fib on Coumadin  .  July 29 2011 a pacemaker was placed. A followup an echocardiogram was performed that demonstrated dilated ascending aorta leading to  evaluation with a CT scan of the chest .  She has no history of aortic valve disease.  Patient has been followed's in the thoracic surgery office since March 2012  She has no family history of aneurysm disease or sudden death, her grandfather did die in his 74s of a cerebral aneurysm.   Patient denies any shortness of breath or chest discomfort at rest, some mild sob with exertion , no angina.  She does note mild edema in her ankles   Since I first started seeing the patient she is become increasingly frail.  She is able to get around with daily activities.  She notes no change in symptoms.  She does note with COVID pandemic her children have severely limited her traveling and getting out.  Current Activity/ Functional Status: Patient is independent with mobility/ambulation, transfers, ADL's, IADL's.   Zubrod Score: At the time of surgery this patient's most appropriate activity status/level should be described as: []     0    Normal activity, no symptoms []     1    Restricted in physical strenuous activity but ambulatory, able to do out light work [x]     2    Ambulatory and capable of self care, unable to do work activities, up and about >50 % of waking hours                              []     3    Only limited self care, in bed greater than  50% of waking hours []     4    Completely disabled, no self care, confined to bed or chair []     5    Moribund   Past Medical History:  Diagnosis Date  . CAD (coronary artery disease)    a. LHC 10/2017 30% RCA otherwise OK.  . Chronic diastolic CHF (congestive heart failure) ([facility])   . Hemorrhoids    current problem as of 04/26/16  with slight bleeding of hemorrhoids   . Hepatitis    hx of medication induced hepatitis (Vytorin per pt)  . History of kidney stones   . HLD (hyperlipidemia)   . HTN (hypertension)   . Hx of echocardiogram    echo 1/13: EF 55%, Gr 2 diast dysfn, Asc Ao aneurysm 4.8 cm, mild MR, mod LAE, mild RAE  . Hypothyroidism   . ICH (intracerebral hemorrhage) ([facility]) 03/24/15  . Lower extremity edema   . NASH (nonalcoholic steatohepatitis)   . Neuromuscular disorder ([facility] [facility])   . Pacemaker-Medtronic 11/12/2011   Implanted 2013   . Permanent atrial fibrillation   . PMR (polymyalgia rheumatica) ([facility])  no steriods for 5 years  . sinus node dysfunction//post termination pauses   . Stroke ([facility])   . Thoracic aortic aneurysm (TAA) ([facility])    a. followed by Dr. Servando Snare.  . Tricuspid regurgitation   . Urinary tract infection    Recurrent infections.     Past Surgical History:  Procedure Laterality Date  . CARDIAC CATHETERIZATION    . CARDIOVERSION  03/02/2012   Procedure: CARDIOVERSION;  Surgeon: Lelon Perla, MD;  Location: [facility] ENDOSCOPY;  Service: Cardiovascular;  Laterality: N/A;  . CARDIOVERSION  03/13/2012   Procedure: CARDIOVERSION;  Surgeon: Josue Hector, MD;  Location: [place];  Service: Cardiovascular;  Laterality: N/A;  . CARDIOVERSION  04/15/2012   Procedure: CARDIOVERSION;  Surgeon: [name], MD;  Location: [place];  Service: Cardiovascular;  Laterality: N/A;  . CARDIOVERSION N/A 06/07/2014   Procedure: CARDIOVERSION;  Surgeon: [name], MD;  Location: [facility] ENDOSCOPY;  Service: Cardiovascular;  Laterality: N/A;  . CARDIOVERSION N/A  08/18/2014   Procedure: CARDIOVERSION;  Surgeon: [name], MD;  Location: [facility];  Service: Cardiovascular;  Laterality: N/A;  . CARDIOVERSION N/A 09/06/2015   Procedure: CARDIOVERSION;  Surgeon: Jerline Pain, MD;  Location: [facility];  Service: Cardiovascular;  Laterality: N/A;  . CYSTOSCOPY WITH STENT PLACEMENT Right 04/22/2016   Procedure: CYSTOSCOPY WITH RIGHT URETERAL STENT INSERTION;  Surgeon: [name], MD;  Location: [place];  Service: Urology;  Laterality: Right;  . CYSTOSCOPY WITH URETEROSCOPY, STONE BASKETRY AND STENT PLACEMENT Right 04/30/2016   Procedure: CYSTOSCOPY WITH URETEROSCOPY, STENT PLACEMENT REMOVAL;  Surgeon: [name], MD;  Location: [facility];  Service: Urology;  Laterality: Right;  . HERNIA REPAIR    . INCISION AND DRAINAGE BREAST ABSCESS  1973  . LEFT HEART CATH AND CORONARY ANGIOGRAPHY N/A 10/28/2017   Procedure: LEFT HEART CATH AND CORONARY ANGIOGRAPHY;  Surgeon: Lorretta Harp, MD;  Location: [place] CV LAB;  Service: Cardiovascular;  Laterality: N/A;  . PACEMAKER INSERTION  Jan 2013  . PERMANENT PACEMAKER INSERTION N/A 07/29/2011   Procedure: PERMANENT PACEMAKER INSERTION;  Surgeon: [name], MD;  [facility] C[facility];  Service: Cardiovascular;  Laterality: N/A;  . UMBILICAL HERNIA REPAIR  1950's    Family History  Problem Relation Age of Onset  . Coronary artery disease Mother   . Coronary artery disease Unknown   . Alzheimer's disease Unknown   . Coronary artery disease Brother   . Coronary artery disease Brother       Social History   Tobacco Use  Smoking Status Former Smoker  . Types: Cigarettes  . Quit date: 07/16/1990  . Years since quitting: 28.4  Smokeless Tobacco Never Used    Social History   Substance and Sexual Activity  Alcohol Use No     Allergies  Allergen Reactions  . Penicillins Anaphylaxis    Has patient had a PCN reaction causing immediate rash, facial/tongue/throat swelling, SOB or lightheadedness  with hypotension: Yes Has patient had a PCN reaction causing severe rash involving mucus membranes or skin necrosis: No Has patient had a PCN reaction that required hospitalization No Has patient had a PCN reaction occurring within the last 10 years: No If all of the above answers are "NO", then may proceed with Cephalosporin use.   . Statins Other (See Comments)    REACTION: elevated LFT's  . Tylenol [Acetaminophen] Other (See Comments)    If taken with Vytorin at risk for liver damage  . Amiodarone Other (See Comments)  .  Tape Other (See Comments)    Redness, Please use "paper" tape only.  . Codeine Other (See Comments)    feel funny, head is fuzzy    Current Outpatient Medications  Medication Sig Dispense Refill  . apixaban (ELIQUIS) 5 MG TABS tablet Take 1 tablet (5 mg total) by mouth 2 (two) times daily. 180 tablet 3  . Calcium Carbonate-Vitamin D (CALTRATE 600+D PO) Take 1 tablet by mouth daily.     . cetirizine (ZYRTEC) 10 MG tablet Take 10 mg by mouth daily as needed for allergies.     . Cholecalciferol (VITAMIN D3) 1000 UNITS tablet Take 1,000 Units by mouth daily.     Marland Kitchen diltiazem (CARDIZEM CD) 120 MG 24 hr capsule Take 1 capsule (120 mg total) by mouth daily. (Patient taking differently: Take 120 mg by mouth 2 (two) times a day. ) 90 capsule 3  . ferrous sulfate 325 (65 FE) MG tablet Take 1 tablet (325 mg total) by mouth daily with breakfast. 90 tablet 3  . furosemide (LASIX) 40 MG tablet Take 40 mg by mouth daily.    . irbesartan (AVAPRO) 150 MG tablet TAKE 1 TABLET BY MOUTH DAILY 90 tablet 3  . labetalol (NORMODYNE) 300 MG tablet Take 334m (1 tab) in the AM; take 4531m(1.5 tabs) in the PM 180 tablet 2  . levothyroxine (SYNTHROID, LEVOTHROID) 25 MCG tablet Take 25 mcg by mouth daily before breakfast.    . potassium chloride (K-DUR) 10 MEQ tablet TAKE 1 TABLET BY MOUTH TWICE DAILY 180 tablet 3  . WELCHOL 625 MG tablet Take 625 mg by mouth every morning and 1250 mg by mouth  at bedtime.     No current facility-administered medications for this visit.        Review of Systems:  Review of Systems  Constitutional: Negative.  Negative for chills, diaphoresis, fever, malaise/fatigue and weight loss.  HENT: Positive for hearing loss. Negative for congestion, nosebleeds, sinus pain, sore throat and tinnitus.   Eyes: Negative.   Respiratory: Negative for cough, hemoptysis, sputum production, wheezing and stridor.   Cardiovascular: Positive for palpitations. Negative for chest pain, orthopnea, claudication and PND.  Gastrointestinal: Negative.  Negative for abdominal pain, blood in stool, constipation, diarrhea, heartburn, melena, nausea and vomiting.  Genitourinary: Negative for dysuria, hematuria and urgency.  Skin: Negative.   Neurological: Negative for dizziness, tingling, tremors and headaches.  Endo/Heme/Allergies: Negative for environmental allergies and polydipsia.  Psychiatric/Behavioral: Negative.        Immunizations: Flu [yBlue.Reese]; Pneumococcal[y  ];  Physical Exam: BP 140/67   Pulse 87   Temp (!) 97.5 F (36.4 C) (Skin)   Resp 20   Ht 5' 6"  (1.676 m)   Wt 162 lb (73.5 kg)   SpO2 95% Comment: RA  BMI 26.15 kg/m  General appearance: alert, cooperative and no distress Head: Normocephalic, without obvious abnormality, atraumatic Neck: no adenopathy, no carotid bruit, no JVD, supple, symmetrical, trachea midline and thyroid not enlarged, symmetric, no tenderness/mass/nodules Lymph nodes: Cervical, supraclavicular, and axillary nodes normal. Back: symmetric, no curvature. ROM normal. No CVA tenderness. Cardio: irregularly irregular rhythm GI: soft, non-tender; bowel sounds normal; no masses,  no organomegaly Extremities: extremities normal, atraumatic, no cyanosis or edema and Homans sign is negative, no sign of DVT Neurologic: Grossly normal    Diagnostic Studies & Laboratory data:     Recent Radiology Findings:  Ct Angio Chest Aorta W/cm  &/or Wo/cm  Result Date: 12/30/2018 CLINICAL DATA:  Follow-up of aneurysmal  disease of the ascending thoracic aorta. EXAM: CT ANGIOGRAPHY CHEST WITH CONTRAST TECHNIQUE: Multidetector CT imaging of the chest was performed using the standard protocol during bolus administration of intravenous contrast. Multiplanar CT image reconstructions and MIPs were obtained to evaluate the vascular anatomy. CONTRAST:  42m ISOVUE-370 IOPAMIDOL (ISOVUE-370) INJECTION 76% [facility] [address] Results: Creatinine 0.9 mg/dL.  Estimated GFR 58 mL/minute COMPARISON:  07/22/2017, 07/04/2016 and other prior studies. FINDINGS: Cardiovascular: The aortic root is of normal caliber at the sinuses of Valsalva and measures approximately 3.1 cm. The ascending thoracic aorta shows stable maximal diameter of approximately 4.8 cm. The proximal arch measures 3.5 cm and the distal arch 3.4 cm. The descending thoracic aorta measures approximately 2.8 cm. There remains stable mild focal dilatation of the distal descending thoracic aorta just above the diaphragmatic hiatus measuring approximately 3.3 x 3.8 cm. There is no evidence of aortic dissection. Proximal great vessels demonstrate normal patency and branching anatomy. The heart size is stable. There is stable appearance a dual-chamber pacemaker. No pericardial fluid. No significant calcified coronary artery plaque identified. Central pulmonary arteries are normal in caliber. Mediastinum/Nodes: Stable scattered small mediastinal lymph nodes without evidence of enlarged mediastinal or hilar lymph nodes. Lungs/Pleura: Stable bibasilar scarring. There is no evidence of pulmonary edema, consolidation, pneumothorax, nodule or pleural fluid. Upper Abdomen: Stable probable small hiatal hernia. No acute abnormality. Musculoskeletal: No chest wall abnormality. No acute or significant osseous findings. Review of the MIP images confirms the above  findings. IMPRESSION: Stable aneurysmal disease of the ascending thoracic aorta measuring approximately 4.8 cm in greatest diameter. There also is stable focal dilatation of the distal descending thoracic aorta just above the diaphragmatic hiatus. Aortic aneurysm NOS (ICD10-I71.9). Electronically Signed   By: GAletta EdouardM.D.   On: 12/30/2018 12:57   Ct Angio Chest Aorta W/cm &/or Wo/cm  Result Date: 07/22/2017 CLINICAL DATA:  Follow-up thoracic aortic aneurysm EXAM: CT ANGIOGRAPHY CHEST WITH CONTRAST TECHNIQUE: Multidetector CT imaging of the chest was performed using the standard protocol during bolus administration of intravenous contrast. Multiplanar CT image reconstructions and MIPs were obtained to evaluate the vascular anatomy. CONTRAST:  781mISOVUE-370 IOPAMIDOL (ISOVUE-370) INJECTION 76% COMPARISON:  07/04/2016 FINDINGS: Cardiovascular: Thoracic aortic aneurysm again noted, maximally measuring 4.7 cm in the proximal ascending thoracic aorta. Aortic arch mildly aneurysmal at 3.3 cm distally. These compared to 4.9 and 3.5 cm previously. Distal descending thoracic aorta near the hiatus measures 3.8 cm compared with 3.7 cm previously. Diffuse aortic atherosclerosis. Heart mildly enlarged. Pacer wires noted in the right heart. Mediastinum/Nodes: No mediastinal, hilar, or axillary adenopathy. Trachea and esophagus are unremarkable. Lungs/Pleura: Mild emphysema. Scarring in the lower lung fields. No confluent opacities or effusions. No suspicious pulmonary nodules. Upper Abdomen: Imaging into the upper abdomen shows no acute findings. Aorta at the hiatus measures 2.8 cm. Musculoskeletal: Chest wall soft tissues are unremarkable. No acute bony abnormality. Review of the MIP images confirms the above findings. IMPRESSION: Thoracic aortic aneurysm, largest in the proximal ascending thoracic aorta at 4.7 cm, also noted within the aortic arch and distal descending thoracic aorta. No significant change since  prior study. Recommend semi-annual imaging followup by CTA or MRA and referral to cardiothoracic surgery if not already obtained. This recommendation follows 2010 ACCF/AHA/AATS/ACR/ASA/SCA/SCAI/SIR/STS/SVM Guidelines for the Diagnosis and Management of Patients With Thoracic Aortic Disease. Circulation. 2010; 121: : U882-C003ortic Atherosclerosis (ICD10-I70.0) and Emphysema (ICD10-J43.9). Electronically Signed   By: KeRolm Baptise.D.  On: 07/22/2017 11:35   I have independently reviewed the above radiology studies  and reviewed the findings with the patient.  Ct Angio Chest Aorta W/cm &/or Wo/cm  Result Date: 07/04/2016 CLINICAL DATA:  FU TAA WO RUPTURE X 3 YRS NO PERTINENT SX NO HX CA DM HX PACEMAKER HTN PRIORS IN PACS EXAM: CT ANGIOGRAPHY CHEST WITH CONTRAST TECHNIQUE: Multidetector CT imaging of the chest was performed using the standard protocol during bolus administration of intravenous contrast. Multiplanar CT image reconstructions and MIPs were obtained to evaluate the vascular anatomy. CONTRAST:  75 mL Isovue-300 IV GFR = 41, USE 60 CC CONTRAST PER DR WAGNER COMPARISON:  11/01/2015 FINDINGS: Cardiovascular: Left subclavian Transvenous pacing leads extend into the right atrium and towards the right ventricular apex. Dilated central pulmonary arteries. Incomplete opacification of pulmonary artery branches ; the exam was not optimized for detection of pulmonary emboli. Patent bilateral pulmonary veins drain into the left atrium. Left atrial enlargement. Scattered coronary calcifications. Thoracic aortic aneurysm with measurements as follows: 3.6 cm sinuses of Valsalva 3.4 cm sino-tubular junction 4.9 cm proximal ascending (stable since previous exam) 4 cm distal ascending/proximal arch 3 cm distal arch 3.5 cm proximal descending 3.7 cm saccular aneurysm distal descending at the level of the right lateral penetrating atheromatous ulcer, previously 3.6 cm 2.7 cm distal descending above the diaphragm.  There is classic 3 vessel brachiocephalic arterial origin anatomy without proximal stenosis. Moderate calcified plaque throughout the thoracic aorta. Mediastinum/Nodes: No pericardial effusion. No mediastinal hematoma. subcentimeter mediastinal lymph nodes. No hilar adenopathy. Lungs/Pleura: No pleural effusion. No pneumothorax. Linear scarring posteriorly at the right lung base. Upper Abdomen: No acute abnormality. Stable 15 mm exophytic probable cyst from the upper pole right kidney. Musculoskeletal: Anterior vertebral endplate spurring at multiple levels in the mid and lower thoracic spine. Review of the MIP images confirms the above findings. IMPRESSION: 1. Stable 4.9 cm ascending aortic aneurysm without complicating features. 2. Continued enlargement of eccentric penetrating atheromatous ulcer with small saccular aneurysm in the distal descending thoracic aorta. Electronically Signed   By: Lucrezia Europe M.D.   On: 07/04/2016 16:56   Ct Angio Chest Aorta W/cm &/or Wo/cm  11/01/2015  CLINICAL DATA:  Followup thoracic aortic aneurysm EXAM: CT ANGIOGRAPHY CHEST WITH CONTRAST TECHNIQUE: Multidetector CT imaging of the chest was performed using the standard protocol during bolus administration of intravenous contrast. Multiplanar CT image reconstructions and MIPs were obtained to evaluate the vascular anatomy. Creatinine was obtained on site at [place] at [address] Results: Creatinine 1.0 mg/dL. CONTRAST:  75 mL Isovue 370. COMPARISON:  10/20/2014 FINDINGS: Vascular: The thoracic aorta is again well visualized. There is dilatation of the ascending aorta stable at 4.9 cm. Some mild motion artifact is noted. It measures approximately 33 mm at the sino-tubular junction an approximately 36 mm at the level of the sinus of Valsalva. Stable diameter of 3.1 cm in the distal aortic arch is noted. Some tapering is seen distal similar to that noted on the prior exam. Calcific plaque is seen without  evidence of dissection. Although not timed for pulmonary embolism evaluation no large central embolus is noted within the pulmonary artery. The brachiocephalic vessels appear within normal limits with mild atherosclerotic changes. The visualized portions of the upper abdominal visceral vessels appear patent. Nonvascular: Some minimal scarring is noted in the right lower lobe stable from the prior exam. No focal infiltrate or sizable effusion is seen. No sizable parenchymal nodule is seen. Mild foci pleural thickening are  seen posteriorly particularly on the left. No sizable hilar or mediastinal adenopathy is noted. The visualized portions of the upper abdomen are within normal limits. A small exophytic cyst is again noted from the upper pole of the right kidney. No acute bony abnormality is seen. Review of the MIP images confirms the above findings. IMPRESSION: Stable appearing aneurysmal dilatation of the thoracic aorta as described. Ascending thoracic aortic aneurysm. Recommend semi-annual imaging followup by CTA or MRA and referral to cardiothoracic surgery if not already obtained. This recommendation follows 2010 ACCF/AHA/AATS/ACR/ASA/SCA/SCAI/SIR/STS/SVM Guidelines for the Diagnosis and Management of Patients With Thoracic Aortic Disease. Circulation. 2010; 121: V855-M158 No new focal abnormality is seen. Electronically Signed   By: Inez Catalina M.D.   On: 11/01/2015 15:02   Ct Angio Chest Aorta W/cm &/or Wo/cm  11/04/2013   CLINICAL DATA:  Ascending aortic enlargement  EXAM: CT ANGIOGRAPHY CHEST WITH CONTRAST  TECHNIQUE: Multidetector CT imaging of the chest was performed using the standard protocol during bolus administration of intravenous contrast. Multiplanar CT image reconstructions and MIPs were obtained to evaluate the vascular anatomy.  CONTRAST:  86m OMNIPAQUE IOHEXOL 350 MG/ML SOLN  COMPARISON:  09/23/2012  FINDINGS: Stable small hypodense thyroid lesions. Dual lead pacer noted. At the level of  the pulmonary artery on image 57 of series 4, the ascending aortic transverse caliber is 4.9 cm, no change from 09/23/2012. Central arch 3.2 cm on image 28 of series 4 (stable) and proximal descending thoracic aorta 3.1 cm (likewise stable) if scattered atherosclerotic calcification involving the ascending aorta, aortic arch, descending thoracic aorta, and branch vasculature noted. Mild cardiomegaly is again noted. No pathologic thoracic adenopathy or pericardial effusion.  There is a slightly greater amount of right eccentric mural thrombus in the descending thoracic aorta adjacent to the aortic hiatus on image 89 of series 4. The celiac trunk and nodes prompt proximal branches appear patent. An exophytic right kidney upper pole lesion is likely a cyst, measuring 13 Hounsfield units.  Stable mild right lower lobe scarring. Thoracic kyphosis and thoracic spondylosis noted.  Review of the MIP images confirms the above findings.  IMPRESSION: 1. No change in size or appearance of ascending aortic aneurysm (which measures up to 4.9 cm) or descending thoracic aortic ectasia. 2. Thoracic kyphosis and spondylosis.   Electronically Signed   By: WSherryl BartersM.D.   On: 11/04/2013 09:10   Ct Angio Chest W/cm &/or Wo Cm  09/23/2012  *RADIOLOGY REPORT*  Clinical Data: Follow-up ascending aortic aneurysm.  CT ANGIOGRAPHY CHEST  Technique:  Multidetector CT imaging of the chest using the standard protocol during bolus administration of intravenous contrast. Multiplanar reconstructed images including MIPs were obtained and reviewed to evaluate the vascular anatomy.  Contrast: 856mOMNIPAQUE IOHEXOL 350 MG/ML SOLN  Comparison: 01/28/2012.  Findings: Low attenuation lesions in the thyroid measure up to 1.4 cm on the right, as before.  Ascending aorta measures up to 4.9 cm, unchanged from the prior examination.  Transverse aorta measures up to 3.2 cm and proximal descending thoracic aorta, 3.1 cm, also unchanged.  There is  atherosclerotic irregularity and calcification of the aorta with calcification seen in the coronary arteries. Heart is mildly enlarged.  No pericardial effusion.  No pathologically enlarged mediastinal, hilar or axillary lymph nodes.  Scattered scarring in the right lower lobe and along the left major fissure.  No pleural fluid.  Airway is unremarkable.  Incidental imaging of the upper abdomen shows no acute findings. No worrisome lytic or sclerotic lesions.  IMPRESSION:  1.  Stable ascending aortic aneurysm. Transverse and descending thoracic aortic diameters are stable as well. 2.  Coronary artery calcification.   Original Report Authenticated By: Lorin Picket, M .     Followup CT scan of the chest done July 2013 Ct Angio Chest Aorta W/cm &/or Wo/cm  10/20/2014   CLINICAL DATA:  Thoracic aortic aneurysm.  EXAM: CT ANGIOGRAPHY CHEST WITH CONTRAST  TECHNIQUE: Multidetector CT imaging of the chest was performed using the standard protocol during bolus administration of intravenous contrast. Multiplanar CT image reconstructions and MIPs were obtained to evaluate the vascular anatomy.  CONTRAST:  75 mL of Isovue 370 intravenously.  COMPARISON:  CT scan of November 04, 2013.  FINDINGS: No pneumothorax or pleural effusion is noted. No acute pulmonary disease is noted. Ascending thoracic aorta has maximum measured diameter of 4.9 cm which is not significantly changed compared to prior exam. Transverse aortic arch measures 3.1 cm which is not significantly changed compared to prior exam. Proximal portion of descending thoracic aorta measures 3.1 cm which is not significantly changed compared to prior exam. No dissection is noted. Atherosclerotic calcifications are again noted. No mediastinal mass or adenopathy is noted. Left-sided pacemaker is unchanged in position. Pulmonary arteries appear grossly normal. Stable mild cardiomegaly is noted. Stable exophytic cyst is seen arising from upper pole of right kidney.  Review  of the MIP images confirms the above findings.  IMPRESSION: Stable ascending thoracic aortic aneurysm with maximum measured diameter 4.9 cm. No significant changes noted compared to prior exam.   Electronically Signed   By: Marijo Conception, M.D.   On: 10/20/2014 13:17    Ct Angio Chest W/cm &/or Wo Cm  01/28/2012  *RADIOLOGY REPORT*  Clinical Data: Follow-up aortic aneurysm.  CT ANGIOGRAPHY CHEST  Technique:  Multidetector CT imaging of the chest using the standard protocol during bolus administration of intravenous contrast. Multiplanar reconstructed images including MIPs were obtained and reviewed to evaluate the vascular anatomy.  Contrast: 127m OMNIPAQUE IOHEXOL 300 MG/ML  SOLN  Comparison: 08/13/2011  Findings: Again noted is the mild aneurysmal dilatation of the ascending aorta, currently measuring maximally 4.7 cm compared 5.0 cm previously.  No dissection or significant change.  Proximal descending thoracic aorta measures maximally 3.1 cm.  Descending thoracic aorta at the aortic hiatus measures 2.8 cm.  Distal thoracic aortic calcifications noted.  Left-sided pacer remains in place, unchanged.  Coronary artery calcifications present. No mediastinal, hilar, or axillary adenopathy.  Linear scarring in the lung bases.  Lungs otherwise clear.  No pleural effusions.  No suspicious pulmonary nodules or masses.  Visualized chest wall soft tissues are unremarkable.  Imaging into the upper abdomen demonstrates no acute findings.  No acute bony abnormality.  Mild degenerative changes and kyphosis in the thoracic spine.  IMPRESSION: Stable ascending aortic aneurysm, measuring maximally 4.7 cm.  Coronary artery disease.  Original Report Authenticated By: KRaelyn Number M.D.    Recent Lab Findings: Lab Results  Component Value Date   WBC 5.6 09/21/2018   HGB 11.7 09/21/2018   HCT 37.3 09/21/2018   PLT 231 09/21/2018   GLUCOSE 82 09/21/2018   CHOL 178 10/28/2017   TRIG 41 10/28/2017   HDL 71 10/28/2017    LDLCALC 99 10/28/2017   ALT 14 10/27/2017   AST 19 10/27/2017   NA 145 (H) 09/21/2018   K 3.8 09/21/2018   CL 105 09/21/2018   CREATININE 0.79 09/21/2018   BUN 10 09/21/2018   CO2 24 09/21/2018  TSH 0.575 08/21/2018   INR 1.29 10/27/2017   HGBA1C 5.5 03/27/2015   Aortic Size Index=     4.8    /Body surface area is 1.85 meters squared. =2.54  < 2.75 cm/m2      4% risk per year 2.75 to 4.25          8% risk per year > 4.25 cm/m2    20% risk per year     Assessment / Plan:  1/  ascending aortic aneurysm (which measures up to 4.8cm) stable since 2012     2/descending thoracic aortic ectasia 3/Thoracic kyphosis and spondylosis 4/ history of afib on coumadin  I reviewed the patient's CT scan and discussed with her a dilated ascending aorta at 4.8 cm at [place] stable since 2012.  At her age of 31 years and increasing fragility I would not recommend elective repair of her ascending aorta.  I discussed with her the risks of dissection she notes that she would like to continue to know if aneurysm is changing but prefers not to have any major surgical procedure.With her advancing age and fragility  history of stroke and anticoagulation with a nonreversible agent I also discussed with her the place for surgical intervention and replacement of her aorta in an emergency situation. We will plan to see her back in 1 year with a CTA of the chest   Grace Isaac MD  [place] [facility] [phone] 12/31/2018 11:47 AM

## 2018-12-31 NOTE — Patient Instructions (Signed)

## 2019-01-01 ENCOUNTER — Telehealth: Payer: Self-pay | Admitting: Internal Medicine

## 2019-01-01 NOTE — Telephone Encounter (Signed)
  Patient spoke to nurse last week about her feet swelling and was advised to take extra lasix. She says it helped for the day she took the extra but then it just comes right back. She would like to speak to the nurse. After 2 pm please call her husbands cell [phone]

## 2019-01-01 NOTE — Telephone Encounter (Signed)
Attempted to call pt, but phone disconnected. I left a detailed VM on pt's answering machine with the following instructions per Dr. Caryl Comes:  Take 27m lasix x 3 days for swelling. Call on Tues for an update.

## 2019-01-04 NOTE — Telephone Encounter (Signed)
Spoke with pt and she was at the [place] all weekend. She began her 69m lasix x 3 days today. She will call on Thurs with an update. She is also requesting clarifiation on her Dilt, whether it is qd or bid. I will clarify with Dr KCaryl Comesand contact her back.

## 2019-01-04 NOTE — Telephone Encounter (Signed)
Follow up   Patient is returning your call per the previous message. Please call.

## 2019-01-07 ENCOUNTER — Telehealth: Payer: Self-pay | Admitting: Internal Medicine

## 2019-01-07 NOTE — Telephone Encounter (Signed)
Pt calling to report her ankle swelling is much better. I advised her to watch her salt intake and to wear her compression stockings as much as she can tolerate them. She agrees.  I also confirmed with her per Dr Olin Pia last OV note, she is to to take Diltazem, 130m, twice daily. She agrees and will continue.  Per Dr. KOlin Pianote: "For now, we will hold her irbesartan and with this increase her diltiazem from 120 daily--twice daily to hopefully address her nocturnal palpitations."  She had no additional questions or needs.

## 2019-01-07 NOTE — Telephone Encounter (Signed)
Follow up:     Patient states you call er and she is returning your call back.

## 2019-01-13 NOTE — Telephone Encounter (Signed)
thks so much and for including the thinking   UTB    SK

## 2019-01-25 ENCOUNTER — Ambulatory Visit (INDEPENDENT_AMBULATORY_CARE_PROVIDER_SITE_OTHER): Payer: Medicare Other | Admitting: *Deleted

## 2019-01-25 DIAGNOSIS — I5032 Chronic diastolic (congestive) heart failure: Secondary | ICD-10-CM | POA: Diagnosis not present

## 2019-01-25 DIAGNOSIS — I4821 Permanent atrial fibrillation: Secondary | ICD-10-CM

## 2019-01-25 LAB — CUP PACEART REMOTE DEVICE CHECK
Battery Impedance: 762 Ohm
Battery Remaining Longevity: 84 mo
Battery Voltage: 2.78 V
Brady Statistic RV Percent Paced: 33 %
Date Time Interrogation Session: 20200713133423
Implantable Lead Implant Date: 20130114
Implantable Lead Implant Date: 20130114
Implantable Lead Location: 753859
Implantable Lead Location: 753860
Implantable Lead Model: 1948
Implantable Lead Model: 5076
Implantable Pulse Generator Implant Date: 20130114
Lead Channel Impedance Value: 67 Ohm
Lead Channel Impedance Value: 688 Ohm
Lead Channel Pacing Threshold Amplitude: 0.5 V
Lead Channel Pacing Threshold Pulse Width: 0.4 ms
Lead Channel Setting Pacing Amplitude: 2.5 V
Lead Channel Setting Pacing Pulse Width: 0.4 ms
Lead Channel Setting Sensing Sensitivity: 5.6 mV

## 2019-02-08 NOTE — Progress Notes (Signed)
Remote pacemaker transmission.   

## 2019-02-10 ENCOUNTER — Other Ambulatory Visit: Payer: Self-pay | Admitting: Internal Medicine

## 2019-02-10 MED ORDER — DILTIAZEM HCL ER COATED BEADS 120 MG PO CP24: 120.0000 mg | ORAL_CAPSULE | Freq: Two times a day (BID) | ORAL | 3 refills | Status: DC

## 2019-02-10 NOTE — Telephone Encounter (Signed)
New message    *STAT* If patient is at the pharmacy, call can be transferred to refill team.   1. Which medications need to be refilled? (please list name of each medication and dose if known) diltiazem (CARDIZEM CD) 120 MG 24 hr capsule Take 120 mg by mouth 2 (two) times a day.   2. Which pharmacy/location (including street and city if local pharmacy) is medication to be sent to? [facility] [place].  3. Do they need a 30 day or 90 day supply? 90 day supply

## 2019-02-10 NOTE — Telephone Encounter (Signed)
Pt's medication was sent to pt's pharmacy as requested. Confirmation received.  °

## 2019-02-23 ENCOUNTER — Other Ambulatory Visit: Payer: Self-pay | Admitting: Internal Medicine

## 2019-02-23 DIAGNOSIS — Z1231 Encounter for screening mammogram for malignant neoplasm of breast: Secondary | ICD-10-CM

## 2019-02-24 ENCOUNTER — Other Ambulatory Visit: Payer: Self-pay | Admitting: Internal Medicine

## 2019-04-12 ENCOUNTER — Other Ambulatory Visit: Payer: Self-pay

## 2019-04-12 ENCOUNTER — Ambulatory Visit
Admission: RE | Admit: 2019-04-12 | Discharge: 2019-04-12 | Disposition: A | Discharge status: Home / Self Care | Payer: Medicare Other | Source: Ambulatory Visit | Attending: Internal Medicine | Admitting: Internal Medicine

## 2019-04-12 DIAGNOSIS — Z1231 Encounter for screening mammogram for malignant neoplasm of breast: Secondary | ICD-10-CM

## 2019-04-27 ENCOUNTER — Ambulatory Visit (INDEPENDENT_AMBULATORY_CARE_PROVIDER_SITE_OTHER): Payer: Medicare Other | Admitting: *Deleted

## 2019-04-27 DIAGNOSIS — I5032 Chronic diastolic (congestive) heart failure: Secondary | ICD-10-CM

## 2019-04-27 DIAGNOSIS — I4821 Permanent atrial fibrillation: Secondary | ICD-10-CM

## 2019-04-28 LAB — CUP PACEART REMOTE DEVICE CHECK
Battery Impedance: 815 Ohm
Battery Remaining Longevity: 80 mo
Battery Voltage: 2.78 V
Brady Statistic RV Percent Paced: 33 %
Date Time Interrogation Session: 20201014083902
Implantable Lead Implant Date: 20130114
Implantable Lead Implant Date: 20130114
Implantable Lead Location: 753859
Implantable Lead Location: 753860
Implantable Lead Model: 1948
Implantable Lead Model: 5076
Implantable Pulse Generator Implant Date: 20130114
Lead Channel Impedance Value: 652 Ohm
Lead Channel Impedance Value: 67 Ohm
Lead Channel Pacing Threshold Amplitude: 0.5 V
Lead Channel Pacing Threshold Pulse Width: 0.4 ms
Lead Channel Setting Pacing Amplitude: 2.5 V
Lead Channel Setting Pacing Pulse Width: 0.46 ms
Lead Channel Setting Sensing Sensitivity: 5.6 mV

## 2019-05-10 NOTE — Progress Notes (Signed)
Remote pacemaker transmission.   

## 2019-06-15 ENCOUNTER — Telehealth: Payer: Self-pay | Admitting: Internal Medicine

## 2019-06-15 NOTE — Telephone Encounter (Signed)
Spoke with pt and for a few days has noted swelling to feet worse in the left per pt .Pt has taken extra Lasix for 3 days and this helped Per pt yesterday feet were normal and now today notes swelling to left foot Per pt coloring is good does have bruising on occasion.Will forward to Dr Caryl Comes for review and recommendations.

## 2019-06-15 NOTE — Telephone Encounter (Signed)
Pt c/o swelling: STAT is pt has developed SOB within 24 hours  1) How much weight have you gained and in what time span? Yes   2) If swelling, where is the swelling located? Swelling in her feet.  3) Are you currently taking a fluid pill? yes  4) Are you currently SOB? No   5) Do you have a log of your daily weights (if so, list)? No   6) Have you gained 3 pounds in a day or 5 pounds in a week? Not sure   7) Have you traveled recently? No

## 2019-06-16 NOTE — Telephone Encounter (Signed)
Per Dr. Caryl Comes-  She can double up her lasix in the AM PRN for edema.  Or she can try just taking double every other day.  He indicated she should be educated on how to manage her lasix for her edema without taking double every day.

## 2019-06-16 NOTE — Telephone Encounter (Signed)
I spoke to the patient with Dr Olin Pia recommendation and she will try taking Lasix 80 mg every other day and see if that helps with the left foot swelling.  I educated her on managing her Lasix intake.  She will weigh daily, keep legs elevated and wear her compression stockings.    She will call us in 1 week to give [place] an update.

## 2019-06-16 NOTE — Telephone Encounter (Signed)
lpmtcb 12/2

## 2019-06-22 ENCOUNTER — Other Ambulatory Visit: Payer: Self-pay | Admitting: Internal Medicine

## 2019-06-22 DIAGNOSIS — I480 Paroxysmal atrial fibrillation: Secondary | ICD-10-CM

## 2019-06-22 NOTE — Telephone Encounter (Signed)
Pt last saw Dr Caryl Comes 12/04/18 telemedicine Covid-19, last labs 09/21/18 Creat 0.79, age 85, weight 73.5kg, based on specified criteria pt is on appropriate dosage of Eliquis 40m BID.  Will refill rx.

## 2019-07-07 ENCOUNTER — Telehealth: Payer: Self-pay | Admitting: Internal Medicine

## 2019-07-07 DIAGNOSIS — I4891 Unspecified atrial fibrillation: Secondary | ICD-10-CM

## 2019-07-07 DIAGNOSIS — I5032 Chronic diastolic (congestive) heart failure: Secondary | ICD-10-CM

## 2019-07-07 MED ORDER — FUROSEMIDE 40 MG PO TABS: ORAL_TABLET | ORAL | 3 refills | Status: DC

## 2019-07-07 NOTE — Telephone Encounter (Signed)
New Message:   Pt c/o swelling: STAT is pt has developed SOB within 24 hours  1) How much weight have you gained and in what time span? no  2) If swelling, where is the swelling located? Legs.   3) Are you currently taking a fluid pill? Lasix 40 mg  4) Are you currently SOB? no  5) Do you have a log of your daily weights (if so, list)? no  6) Have you gained 3 pounds in a day or 5 pounds in a week? no  7) Have you traveled recently? no   Pt states she called earlier in the month about the same issue. She was told to take an extra lasix for 3 days and elevate her feet. She has done that but still reports swelling in her feet and legs. She seems to be able to have difficulty managing the swelling in her L leg. She normally weighs around 155-157 give or take daily. She weighed 158 this morning. She does not weigh herself daily, but knows when she has gained some fluid so weighs herself then.    She just wants to know what to do before the [facility]

## 2019-07-07 NOTE — Telephone Encounter (Signed)
Returned call to patient to discuss leg swelling, following up from message a few weeks ago. She reports that left leg swelling persists. She took Lasix 80 mg daily x 3 days on 2 different occasions and left leg remains swollen. Reports right leg swelling resolves easily with 80 mg Lasix. She did not understand previous instructions from Dr. Caryl Comes that she may alternate Lasix 40 mg and 80 mg every other day. She is currently taking Lasix 40 mg daily. She continues to take potassium supplement 20 mEq daily. She reports avoiding sodium as much as possible and prepares most meals at home. She denies injury to the left foot. I reviewed other causes for leg swelling other than heart failure and advised her to elevate legs and continue compression stockings. She admits she is unable to ambulate much due to balance issues. I advised her to elevate legs as much as possible. I gave her the [email or web address] website for her review and scheduled her for a BMET next week. I advised that I will forward message to Dr. Caryl Comes for additional advice when he returns. She was very appreciative of my help.

## 2019-07-10 NOTE — Telephone Encounter (Signed)
If her BP is ok, lets try having her stop her dilt and see if her edema resolves Thanks SK

## 2019-07-14 ENCOUNTER — Other Ambulatory Visit: Payer: Medicare Other | Admitting: *Deleted

## 2019-07-14 ENCOUNTER — Other Ambulatory Visit: Payer: Self-pay

## 2019-07-14 DIAGNOSIS — I5032 Chronic diastolic (congestive) heart failure: Secondary | ICD-10-CM

## 2019-07-14 DIAGNOSIS — I4891 Unspecified atrial fibrillation: Secondary | ICD-10-CM

## 2019-07-14 NOTE — Telephone Encounter (Signed)
Spoke with pt, advised of MD recommendation to stop Diltiazem.  Pt has not been checking her BP lately but has been asymptomatic.  She will hold Diltiazem for now and start monitoring her BP.  Pt to call with update in a couple of weeks.

## 2019-07-15 LAB — BASIC METABOLIC PANEL
BUN/Creatinine Ratio: 21 (ref 12–28)
BUN: 16 mg/dL (ref 8–27)
CO2: 25 mmol/L (ref 20–29)
Calcium: 9.2 mg/dL (ref 8.7–10.3)
Chloride: 109 mmol/L — ABNORMAL HIGH (ref 96–106)
Creatinine, Ser: 0.76 mg/dL (ref 0.57–1.00)
GFR calc Af Amer: 82 mL/min/{1.73_m2} (ref >59)
GFR calc non Af Amer: 71 mL/min/{1.73_m2} (ref >59)
Glucose: 98 mg/dL (ref 65–99)
Potassium: 3.7 mmol/L (ref 3.5–5.2)
Sodium: 145 mmol/L — ABNORMAL HIGH (ref 134–144)

## 2019-07-19 ENCOUNTER — Telehealth: Payer: Self-pay

## 2019-07-19 NOTE — Telephone Encounter (Signed)
Spoke with pt and advised per Dr Caryl Comes labs are normal.  Pt verbalizes understanding.

## 2019-07-19 NOTE — Telephone Encounter (Signed)
-----   Message from [name], MD sent at 07/19/2019  2:21 PM EST ----- Please Inform Patient that labs are normal  Thanks

## 2019-07-27 ENCOUNTER — Ambulatory Visit (INDEPENDENT_AMBULATORY_CARE_PROVIDER_SITE_OTHER): Payer: Medicare PPO | Admitting: *Deleted

## 2019-07-27 DIAGNOSIS — I5032 Chronic diastolic (congestive) heart failure: Secondary | ICD-10-CM | POA: Diagnosis not present

## 2019-07-27 LAB — CUP PACEART REMOTE DEVICE CHECK
Battery Impedance: 865 Ohm
Battery Remaining Longevity: 79 mo
Battery Voltage: 2.77 V
Brady Statistic RV Percent Paced: 33 %
Date Time Interrogation Session: 20210112092322
Implantable Lead Implant Date: 20130114
Implantable Lead Implant Date: 20130114
Implantable Lead Location: 753859
Implantable Lead Location: 753860
Implantable Lead Model: 1948
Implantable Lead Model: 5076
Implantable Pulse Generator Implant Date: 20130114
Lead Channel Impedance Value: 67 Ohm
Lead Channel Impedance Value: 701 Ohm
Lead Channel Pacing Threshold Amplitude: 0.625 V
Lead Channel Pacing Threshold Pulse Width: 0.4 ms
Lead Channel Setting Pacing Amplitude: 2.5 V
Lead Channel Setting Pacing Pulse Width: 0.4 ms
Lead Channel Setting Sensing Sensitivity: 5.6 mV

## 2019-08-21 ENCOUNTER — Other Ambulatory Visit: Payer: Self-pay | Admitting: Internal Medicine

## 2019-08-25 ENCOUNTER — Telehealth: Payer: Self-pay | Admitting: Internal Medicine

## 2019-08-25 NOTE — Telephone Encounter (Signed)
Attempted phone call to pt, left voicemail message for pt to return call to RN at [phone].

## 2019-08-25 NOTE — Telephone Encounter (Signed)
Pt c/o swelling: STAT is pt has developed SOB within 24 hours  1) How much weight have you gained and in what time span? Gains and loses 3 lbs on and off  2) If swelling, where is the swelling located? Feet   3) Are you currently taking a fluid pill? Yes   4) Are you currently SOB? No   5) Do you have a log of your daily weights (if so, list)? No   6) Have you gained 3 pounds in a day or 5 pounds in a week? Yes, 3 lbs in a day   7) Have you traveled recently? No   Patient is calling stating she's been having issues with swelling. She states she is struggling to keep the fluid off her feet, she will gain 3 lbs in a day then lose it tomorrow and it comeback the day after. She also is experiencing excessive bruising on feet and a little on legs. The patient has an appointment scheduled regarding this on 08/31/19. Please advise.

## 2019-08-27 NOTE — Telephone Encounter (Signed)
Thank you!  I would have her wear compression stockings if she has them (recommend getting them if she does not) and with waxing and waning symptoms, I would continue current meds until I see her next week.     Thanks again! Legrand Como [address]" [place], PA-C  08/27/2019 11:16 AM

## 2019-08-27 NOTE — Telephone Encounter (Signed)
Spoke with pt who reports she continues to have intermittent swelling of feet and ankles despite alternating Lasix 30m/80mg.  Pt states she can have a 3 pound weight gain in a day and the next day it may be gone but swelling in feet and ankles remains.  Pt denies SOB or other symptoms.  Pt reports she wears her compression hose and even sleeps in them from time to time.    Pt has an appointment with ARebecca Eatonon 08/31/2019 for further evaluation.  Pt advised to continue medications as prescribed and keep appointment with PA on 02/16.  Will forward information to Mr TChalmers Cateras Dr KCaryl Comesis out of office this week.  Pt verbalizes understanding and is agreeable with current plan.

## 2019-08-30 NOTE — Progress Notes (Signed)
Electrophysiology Office Note Date: 08/31/2019  ID:  [name] [name], [name] [name] 07/30/1930, MRN [id number]  PCP: [name], MD Primary Cardiologist: Virl Axe, MD Electrophysiologist: Dr. Caryl Comes   CC: Pacemaker follow-up  [name] [name] is a 86 y.o. female seen today for Dr. Caryl Comes . she presents today for add on due to increased edema and fatigue over the past month.  She states she can have weight gain of 3 lbs overnight and swelling her legs. Swelling doesn't really go away but weight fluctuates. She limits sodium and watches fluid intake. She does not better UOP on lasix 80 days.  She feels like she felt "years ago" when she was going in and out of atrial fibrillation. She denies chest pain, bendopnea, or PND. She sleeps in a recliner chronically for comfort, says that it is positional and is not related to bleeding.   Device History: Medtronic Dual Chamber PPM implanted 07/2011 for SSS/Tachy-brady syndrome  Past Medical History:  Diagnosis Date  . CAD (coronary artery disease)    a. LHC 10/2017 30% RCA otherwise OK.  . Chronic diastolic CHF (congestive heart failure) ([place])   . Hemorrhoids    current problem as of 04/26/16  with slight bleeding of hemorrhoids   . Hepatitis    hx of medication induced hepatitis (Vytorin per pt)  . History of kidney stones   . HLD (hyperlipidemia)   . HTN (hypertension)   . Hx of echocardiogram    echo 1/13: EF 55%, Gr 2 diast dysfn, Asc Ao aneurysm 4.8 cm, mild MR, mod LAE, mild RAE  . Hypothyroidism   . ICH (intracerebral hemorrhage) ([place]) 03/24/15  . Lower extremity edema   . NASH (nonalcoholic steatohepatitis)   . Neuromuscular disorder ([place])   . Pacemaker-Medtronic 11/12/2011   Implanted 2013   . Permanent atrial fibrillation ([place])   . PMR (polymyalgia rheumatica) ([facility])    no steriods for 5 years  . sinus node dysfunction//post termination pauses   . Stroke ([place])   . Thoracic aortic aneurysm (TAA) ([place])    a. followed by Dr. Servando Snare.   . Tricuspid regurgitation   . Urinary tract infection    Recurrent infections.    Past Surgical History:  Procedure Laterality Date  . CARDIAC CATHETERIZATION    . CARDIOVERSION  03/02/2012   Procedure: CARDIOVERSION;  Surgeon: Lelon Perla, MD;  Location: [facility] ENDOSCOPY;  Service: Cardiovascular;  Laterality: N/A;  . CARDIOVERSION  03/13/2012   Procedure: CARDIOVERSION;  Surgeon: Josue Hector, MD;  Location: [place];  Service: Cardiovascular;  Laterality: N/A;  . CARDIOVERSION  04/15/2012   Procedure: CARDIOVERSION;  Surgeon: [name], MD;  Location: [place];  Service: Cardiovascular;  Laterality: N/A;  . CARDIOVERSION N/A 06/07/2014   Procedure: CARDIOVERSION;  Surgeon: [name], MD;  Location: [facility] ENDOSCOPY;  Service: Cardiovascular;  Laterality: N/A;  . CARDIOVERSION N/A 08/18/2014   Procedure: CARDIOVERSION;  Surgeon: [name], MD;  Location: [place];  Service: Cardiovascular;  Laterality: N/A;  . CARDIOVERSION N/A 09/06/2015   Procedure: CARDIOVERSION;  Surgeon: Jerline Pain, MD;  Location: [place];  Service: Cardiovascular;  Laterality: N/A;  . CYSTOSCOPY WITH STENT PLACEMENT Right 04/22/2016   Procedure: CYSTOSCOPY WITH RIGHT URETERAL STENT INSERTION;  Surgeon: [name], MD;  Location: [place];  Service: Urology;  Laterality: Right;  . CYSTOSCOPY WITH URETEROSCOPY, STONE BASKETRY AND STENT PLACEMENT Right 04/30/2016   Procedure: CYSTOSCOPY WITH URETEROSCOPY, STENT PLACEMENT REMOVAL;  Surgeon: [name], MD;  Location:  [facility];  Service: Urology;  Laterality: Right;  . HERNIA REPAIR    . INCISION AND DRAINAGE BREAST ABSCESS  1973  . LEFT HEART CATH AND CORONARY ANGIOGRAPHY N/A 10/28/2017   Procedure: LEFT HEART CATH AND CORONARY ANGIOGRAPHY;  Surgeon: Lorretta Harp, MD;  Location: [place] CV LAB;  Service: Cardiovascular;  Laterality: N/A;  . PACEMAKER INSERTION  Jan 2013  . PERMANENT PACEMAKER INSERTION N/A 07/29/2011   Procedure:  PERMANENT PACEMAKER INSERTION;  Surgeon: [name], MD;  Location: [facility] CATH LAB;  Service: Cardiovascular;  Laterality: N/A;  . UMBILICAL HERNIA REPAIR  1950's    Current Outpatient Medications  Medication Sig Dispense Refill  . Calcium Carbonate-Vitamin D (CALTRATE 600+D PO) Take 1 tablet by mouth daily.     . cetirizine (ZYRTEC) 10 MG tablet Take 10 mg by mouth daily as needed for allergies.     . Cholecalciferol (VITAMIN D3) 1000 UNITS tablet Take 1,000 Units by mouth daily.     Marland Kitchen diltiazem (CARDIZEM CD) 120 MG 24 hr capsule Take 1 capsule (120 mg total) by mouth 2 (two) times daily. 180 capsule 3  . ELIQUIS 5 MG TABS tablet TAKE 1 TABLET BY MOUTH TWICE DAILY 180 tablet 1  . ferrous sulfate 325 (65 FE) MG tablet Take 1 tablet (325 mg total) by mouth daily with breakfast. 90 tablet 3  . furosemide (LASIX) 40 MG tablet Alternate 40 mg and 80 mg every other day 150 tablet 3  . labetalol (NORMODYNE) 300 MG tablet TAKE 1 TABLET BY MOUTH IN THE MORNING AND 1 & 1/2 TABLET IN THE EVENING. Please make yearly appt with Dr. Caryl Comes for May before anymore refills. 1st attempt 225 tablet 1  . levothyroxine (SYNTHROID, LEVOTHROID) 25 MCG tablet Take 25 mcg by mouth daily before breakfast.    . potassium chloride (K-DUR) 10 MEQ tablet TAKE 1 TABLET BY MOUTH TWICE DAILY 180 tablet 3  . WELCHOL 625 MG tablet Take 625 mg by mouth every morning and 1250 mg by mouth at bedtime.     No current facility-administered medications for this visit.    Allergies:   Penicillins, Statins, Tylenol [acetaminophen], Amiodarone, Quinolones, Tape, and Codeine   Social History: Social History   Socioeconomic History  . Marital status: Married    Spouse name: Not on file  . Number of children: Not on file  . Years of education: Not on file  . Highest education level: Not on file  Occupational History  . Not on file  Tobacco Use  . Smoking status: Former Smoker    Types: Cigarettes    Quit date: 07/16/1990     Years since quitting: 29.1  . Smokeless tobacco: Never Used  Substance and Sexual Activity  . Alcohol use: No  . Drug use: No  . Sexual activity: Not on file  Other Topics Concern  . Not on file  Social History Narrative   Pt lives in [place] with spouse.  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 Difficulty of Paying Living Expenses: Not on file  Food Insecurity:   . Worried About Charity fundraiser in the Last Year: Not on file  . Ran Out of Food in the Last Year: Not on file  Transportation Needs:   . Lack of Transportation (Medical): Not on file  . Lack of Transportation (Non-Medical): Not on file  Physical Activity:   . Days of  Exercise per Week: Not on file  . Minutes of Exercise per Session: Not on file  Stress:   . Feeling of Stress : Not on file  Social Connections:   . Frequency of Communication with Friends and Family: Not on file  . Frequency of Social Gatherings with Friends and Family: Not on file  . Attends Religious Services: Not on file  . Active Member of Clubs or Organizations: Not on file  . Attends Archivist Meetings: Not on file  . Marital Status: Not on file  Intimate Partner Violence:   . Fear of Current or Ex-Partner: Not on file  . Emotionally Abused: Not on file  . Physically Abused: Not on file  . Sexually Abused: Not on file    Family History: Family History  Problem Relation Age of Onset  . Coronary artery disease Mother   . Coronary artery disease Other   . Alzheimer's disease Other   . Coronary artery disease Brother   . Coronary artery disease Brother    Review of Systems: All other systems reviewed and are otherwise negative except as noted above.  Physical Exam: Vitals:   08/31/19 1010  BP: (!) 152/80  Pulse: 89  SpO2: 99%  Weight: 162 lb (73.5 kg)  Height: 5' 6"  (1.676 m)     GEN- The patient is well appearing, alert and  oriented x 3 today.   HEENT: normocephalic, atraumatic; sclera clear, conjunctiva pink; hearing intact; oropharynx clear; neck supple  Lungs- Clear to ausculation bilaterally, normal work of breathing.  No wheezes, rales, rhonchi Heart- Regular rate and rhythm, no murmurs, rubs or gallops  GI- soft, non-tender, non-distended, bowel sounds present  Extremities- no clubbing, cyanosis, or edema  MS- no significant deformity or atrophy Skin- warm and dry, no rash or lesion; PPM pocket well healed Psych- euthymic mood, full affect Neuro- strength and sensation are intact  PPM Interrogation- reviewed in detail today,  See PACEART report  EKG:  EKG is ordered today. The ekg ordered today shows Atrial fibrillation with controlled ventricular rate at 89 bpm  Recent Labs: 09/21/2018: Hemoglobin 11.7; Platelets 231 07/14/2019: BUN 16; Creatinine, Ser 0.76; Potassium 3.7; Sodium 145   Wt Readings from Last 3 Encounters:  08/31/19 162 lb (73.5 kg)  12/31/18 162 lb (73.5 kg)  12/04/18 163 lb (73.9 kg)     Other studies Reviewed: Additional studies/ records that were reviewed today include: Previous EP office notes, Previous remote checks, Most recent labwork.   Assessment and Plan:  1. HFpEF Echo 03/2015 showed LVEF 55-60% LHC 10/2017 with mild, non-obstructive CAD She has not stopped diltiazem, and I will continue for now.  At least mildly volume overloaded one exam with 2+ soft ankle edema and JVP ~9-10 cm Will have her take lasix 80 mg x 3 days, then balance her doses and have her daily dose be 60 mg, instead of alternating with 40 mg.  Decrease potassium to 10 meq daily Add spironolactone 12.5 mg daily Update echo  2. SSS/Tachy-brady s/p Medtronic PPM  Normal PPM function See Pace Art report No changes today  3. HTN Dr. Caryl Comes has managed her medication intensively with several different doses and medication.  Mildly elevated today. Will add spiro, but overall not aggressively manage  while we get the volume off of her. Suspect this will come down.   4. Permanent Atrial fibrillation Rates controlled Continue eliquis for CHA2DS2VASC of at least 6   She feels like she is having episodic AF,  but by EKGs and chart, she has been in permanent AF for years.    Current medicines are reviewed at length with the patient today.   The patient does not have concerns regarding her medicines.  The following changes were made today:  Add spiro, adjust lasix, decrease K to make room for spiro.   Labs/ tests ordered today include:  Orders Placed This Encounter  Procedures  . EKG 12-Lead    Disposition:   Follow up with me in 2 Weeks for repeat labs and further adjustment prn.   Jacalyn Lefevre, PA-C  08/31/2019 10:42 AM  [facility] [address] [place] [address] [phone] (office) [phone] (fax)

## 2019-08-31 ENCOUNTER — Ambulatory Visit: Payer: Medicare PPO | Admitting: Student

## 2019-08-31 ENCOUNTER — Other Ambulatory Visit: Payer: Self-pay

## 2019-08-31 ENCOUNTER — Encounter: Payer: Self-pay | Admitting: Student

## 2019-08-31 VITALS — BP 152/80 | HR 89 | Ht 66.0 in | Wt 162.0 lb

## 2019-08-31 DIAGNOSIS — I4821 Permanent atrial fibrillation: Secondary | ICD-10-CM

## 2019-08-31 DIAGNOSIS — I10 Essential (primary) hypertension: Secondary | ICD-10-CM

## 2019-08-31 DIAGNOSIS — I495 Sick sinus syndrome: Secondary | ICD-10-CM | POA: Diagnosis not present

## 2019-08-31 DIAGNOSIS — I5032 Chronic diastolic (congestive) heart failure: Secondary | ICD-10-CM | POA: Diagnosis not present

## 2019-08-31 LAB — CUP PACEART INCLINIC DEVICE CHECK
Battery Impedance: 918 Ohm
Battery Remaining Longevity: 76 mo
Battery Voltage: 2.77 V
Brady Statistic RV Percent Paced: 33 %
Date Time Interrogation Session: 20210216112856
Implantable Lead Implant Date: 20130114
Implantable Lead Implant Date: 20130114
Implantable Lead Location: 753859
Implantable Lead Location: 753860
Implantable Lead Model: 1948
Implantable Lead Model: 5076
Implantable Pulse Generator Implant Date: 20130114
Lead Channel Impedance Value: 67 Ohm
Lead Channel Impedance Value: 696 Ohm
Lead Channel Pacing Threshold Amplitude: 0.5 V
Lead Channel Pacing Threshold Amplitude: 0.5 V
Lead Channel Pacing Threshold Pulse Width: 0.4 ms
Lead Channel Pacing Threshold Pulse Width: 0.4 ms
Lead Channel Sensing Intrinsic Amplitude: 22.4 mV
Lead Channel Setting Pacing Amplitude: 2.5 V
Lead Channel Setting Pacing Pulse Width: 0.4 ms
Lead Channel Setting Sensing Sensitivity: 5.6 mV

## 2019-08-31 MED ORDER — POTASSIUM CHLORIDE ER 10 MEQ PO TBCR: 10.0000 meq | EXTENDED_RELEASE_TABLET | Freq: Every day | ORAL | Status: DC

## 2019-08-31 MED ORDER — SPIRONOLACTONE 25 MG PO TABS: 12.5000 mg | ORAL_TABLET | Freq: Every day | ORAL | 3 refills | Status: DC

## 2019-08-31 MED ORDER — FUROSEMIDE 40 MG PO TABS: 60.0000 mg | ORAL_TABLET | Freq: Every day | ORAL | 3 refills | Status: DC

## 2019-08-31 NOTE — Patient Instructions (Addendum)
Medication Instructions:  Increase Lasix to 80 mg Daily until Friday, then take 60 mg Daily. Decrease Potassium to 10 meq Daily, from twice per day. Start Spironolactone 12.5 mg Daily *If you need a refill on your cardiac medications before your next appointment, please call your pharmacy*  Lab Work: none If you have labs (blood work) drawn today and your tests are completely normal, you will receive your results only by: Marland Kitchen MyChart Message (if you have MyChart) OR . A paper copy in the mail If you have any lab test that is abnormal or we need to change your treatment, we will call you to review the results.  Testing/Procedures: please schedule Your physician has requested that you have an echocardiogram. Echocardiography is a painless test that uses sound waves to create images of your heart. It provides your doctor with information about the size and shape of your heart and how well your heart's chambers and valves are working. This procedure takes approximately one hour. There are no restrictions for this procedure.    Follow-Up: At [facility], you and your health needs are our priority.  As part of our continuing mission to provide you with exceptional heart care, we have created designated Provider Care Teams.  These Care Teams include your primary Cardiologist (physician) and Advanced Practice Providers (APPs -  Physician Assistants and Nurse Practitioners) who all work together to provide you with the care you need, when you need it.  Your next appointment:  Someone will call you.. 2 week(s)  The format for your next appointment:   In Person  Provider:   Oda Kilts, PA  Other Instructions Remote monitoring is used to monitor your Pacemaker from home. This monitoring reduces the number of office visits required to check your device to one time per year. It allows [place] to keep an eye on the functioning of your device to ensure it is working properly. You are scheduled for a  device check from home on 10/26/19. You may send your transmission at any time that day. If you have a wireless device, the transmission will be sent automatically. After your physician reviews your transmission, you will receive a postcard with your next transmission date.

## 2019-09-02 ENCOUNTER — Other Ambulatory Visit (HOSPITAL_COMMUNITY): Payer: Medicare PPO

## 2019-09-09 ENCOUNTER — Telehealth: Payer: Self-pay

## 2019-09-09 ENCOUNTER — Ambulatory Visit (HOSPITAL_COMMUNITY): Payer: Medicare PPO | Attending: Cardiovascular Disease

## 2019-09-09 ENCOUNTER — Other Ambulatory Visit: Payer: Self-pay

## 2019-09-09 DIAGNOSIS — I4821 Permanent atrial fibrillation: Secondary | ICD-10-CM

## 2019-09-09 DIAGNOSIS — I5032 Chronic diastolic (congestive) heart failure: Secondary | ICD-10-CM

## 2019-09-09 NOTE — Telephone Encounter (Signed)
The patient has been notified of the Echo result and verbalized understanding.  All questions (if any) were answered. Frederik Schmidt, RN 09/09/2019 3:35 PM

## 2019-09-09 NOTE — Telephone Encounter (Signed)
-----   Message from Shirley Friar, PA-C sent at 09/09/2019  3:07 PM EST ----- Can you please let her know the squeezing function of her heart is normal.   Good news.   Thank you!

## 2019-09-14 ENCOUNTER — Other Ambulatory Visit: Payer: Self-pay

## 2019-09-14 ENCOUNTER — Ambulatory Visit (INDEPENDENT_AMBULATORY_CARE_PROVIDER_SITE_OTHER): Payer: Medicare PPO | Admitting: Student

## 2019-09-14 ENCOUNTER — Encounter: Payer: Self-pay | Admitting: Student

## 2019-09-14 VITALS — BP 150/82 | HR 96 | Ht 63.0 in | Wt 163.0 lb

## 2019-09-14 DIAGNOSIS — E0590 Thyrotoxicosis, unspecified without thyrotoxic crisis or storm: Secondary | ICD-10-CM

## 2019-09-14 DIAGNOSIS — R0609 Other forms of dyspnea: Secondary | ICD-10-CM

## 2019-09-14 DIAGNOSIS — I5032 Chronic diastolic (congestive) heart failure: Secondary | ICD-10-CM | POA: Diagnosis not present

## 2019-09-14 DIAGNOSIS — R0600 Dyspnea, unspecified: Secondary | ICD-10-CM | POA: Diagnosis not present

## 2019-09-14 DIAGNOSIS — I10 Essential (primary) hypertension: Secondary | ICD-10-CM | POA: Diagnosis not present

## 2019-09-14 LAB — CBC
Hematocrit: 37.5 % (ref 34.0–46.6)
Hemoglobin: 12.6 g/dL (ref 11.1–15.9)
MCH: 33.2 pg — ABNORMAL HIGH (ref 26.6–33.0)
MCHC: 33.6 g/dL (ref 31.5–35.7)
MCV: 99 fL — ABNORMAL HIGH (ref 79–97)
Platelets: 177 10*3/uL (ref 150–450)
RBC: 3.79 x10E6/uL (ref 3.77–5.28)
RDW: 13.2 % (ref 11.7–15.4)
WBC: 7.6 10*3/uL (ref 3.4–10.8)

## 2019-09-14 LAB — BASIC METABOLIC PANEL
BUN/Creatinine Ratio: 34 — ABNORMAL HIGH (ref 12–28)
BUN: 30 mg/dL — ABNORMAL HIGH (ref 8–27)
CO2: 22 mmol/L (ref 20–29)
Calcium: 8.9 mg/dL (ref 8.7–10.3)
Chloride: 104 mmol/L (ref 96–106)
Creatinine, Ser: 0.89 mg/dL (ref 0.57–1.00)
GFR calc Af Amer: 68 mL/min/{1.73_m2} (ref >59)
GFR calc non Af Amer: 59 mL/min/{1.73_m2} — ABNORMAL LOW (ref >59)
Glucose: 105 mg/dL — ABNORMAL HIGH (ref 65–99)
Potassium: 4.1 mmol/L (ref 3.5–5.2)
Sodium: 139 mmol/L (ref 134–144)

## 2019-09-14 LAB — THYROID PANEL WITH TSH
Free Thyroxine Index: 2 (ref 1.2–4.9)
T3 Uptake Ratio: 32 % (ref 24–39)
T4, Total: 6.1 ug/dL (ref 4.5–12.0)
TSH: 1.66 u[IU]/mL (ref 0.450–4.500)

## 2019-09-14 LAB — MAGNESIUM: Magnesium: 1.9 mg/dL (ref 1.6–2.3)

## 2019-09-14 MED ORDER — LOSARTAN POTASSIUM 25 MG PO TABS: 25.0000 mg | ORAL_TABLET | Freq: Every day | ORAL | 3 refills | Status: DC

## 2019-09-14 NOTE — Patient Instructions (Addendum)
Medication Instructions:  START LOSARTAN 25 mg Daily *If you need a refill on your cardiac medications before your next appointment, please call your pharmacy*   Lab Work: TODAY BMET MAGNESIUM CBC TSH If you have labs (blood work) drawn today and your tests are completely normal, you will receive your results only by: Marland Kitchen MyChart Message (if you have MyChart) OR . A paper copy in the mail If you have any lab test that is abnormal or we need to change your treatment, we will call you to review the results.   Testing/Procedures: none   Follow-Up: At [facility], you and your health needs are our priority.  As part of our continuing mission to provide you with exceptional heart care, we have created designated Provider Care Teams.  These Care Teams include your primary Cardiologist (physician) and Advanced Practice Providers (APPs -  Physician Assistants and Nurse Practitioners) who all work together to provide you with the care you need, when you need it.  We recommend signing up for the patient portal called "MyChart".  Sign up information is provided on this After Visit Summary.  MyChart is used to connect with patients for Virtual Visits (Telemedicine).  Patients are able to view lab/test results, encounter notes, upcoming appointments, etc.  Non-urgent messages can be sent to your provider as well.   To learn more about what you can do with MyChart, go to [email or web address].    Your next appointment:   8 week(s)  The format for your next appointment:   Virtual Visit   Provider:   Dr Caryl Comes   Other Instructions Remote monitoring is used to monitor your Pacemaker  from home. This monitoring reduces the number of office visits required to check your device to one time per year. It allows [place] to keep an eye on the functioning of your device to ensure it is working properly. You are scheduled for a device check from home on 10/26/19. You may send your transmission at any time  that day. If you have a wireless device, the transmission will be sent automatically. After your physician reviews your transmission, you will receive a postcard with your next transmission date.  Losartan Tablets What is this medicine? LOSARTAN (loe SAR tan) is an angiotensin II receptor blocker, also known as an ARB. It treats high blood pressure. It can slow kidney damage in some patients. It may also be used to lower the risk of stroke. This medicine may be used for other purposes; ask your health care provider or pharmacist if you have questions. COMMON BRAND NAME(S): Cozaar What should I tell my health care provider before I take this medicine? They need to know if you have any of these conditions:  heart failure  kidney or liver disease  an unusual or allergic reaction to losartan, other medicines, foods, dyes, or preservatives  pregnant or trying to get pregnant  breast-feeding How should I use this medicine? Take this drug by mouth. Take it as directed on the prescription label at the same time every day. You can take it with or without food. If it upsets your stomach, take it with food. Keep taking it unless your health care provider tells you to stop. Talk to your health care provider about the use of this drug in children. While it may be prescribed for children as young as 6 for selected conditions, precautions do apply. Overdosage: If you think you have taken too much of this medicine contact a poison control center or  emergency room at once. NOTE: This medicine is only for you. Do not share this medicine with others. What if I miss a dose? If you miss a dose, take it as soon as you can. If it is almost time for your next dose, take only that dose. Do not take double or extra doses. What may interact with this medicine?  blood pressure medicines  diuretics, especially triamterene, spironolactone, or amiloride  fluconazole  NSAIDs, medicines for pain and inflammation,  like ibuprofen or naproxen  potassium salts or potassium supplements  rifampin This list may not describe all possible interactions. Give your health care provider a list of all the medicines, herbs, non-prescription drugs, or dietary supplements you use. Also tell them if you smoke, drink alcohol, or use illegal drugs. Some items may interact with your medicine. What should I watch for while using this medicine? Visit your doctor or health care professional for regular checks on your progress. Check your blood pressure as directed. Ask your doctor or health care professional what your blood pressure should be and when you should contact him or her. Call your doctor or health care professional if you notice an irregular or fast heart beat. Women should inform their doctor if they wish to become pregnant or think they might be pregnant. There is a potential for serious side effects to an unborn child, particularly in the second or third trimester. Talk to your health care professional or pharmacist for more information. You may get drowsy or dizzy. Do not drive, use machinery, or do anything that needs mental alertness until you know how this drug affects you. Do not stand or sit up quickly, especially if you are an older patient. This reduces the risk of dizzy or fainting spells. Alcohol can make you more drowsy and dizzy. Avoid alcoholic drinks. Avoid salt substitutes unless you are told otherwise by your doctor or health care professional. Do not treat yourself for coughs, colds, or pain while you are taking this medicine without asking your doctor or health care professional for advice. Some ingredients may increase your blood pressure. What side effects may I notice from receiving this medicine? Side effects that you should report to your doctor or health care professional as soon as possible:  confusion, dizziness, light headedness or fainting spells  decreased amount of urine  passed  difficulty breathing or swallowing, hoarseness, or tightening of the throat  fast or irregular heart beat, palpitations, or chest pain  skin rash, itching  swelling of your face, lips, tongue, hands, or feet Side effects that usually do not require medical attention (report to your doctor or health care professional if they continue or are bothersome):  cough  decreased sexual function or desire  headache  nasal congestion or stuffiness  nausea or stomach pain  sore or cramping muscles This list may not describe all possible side effects. Call your doctor for medical advice about side effects. You may report side effects to FDA at 1-800-FDA-1088. Where should I keep my medicine? Keep out of the reach of children and pets. Store at room temperature between 15 and 30 degrees C (59 and 86 degrees F). Protect from light. Keep the container tightly closed. Throw away any unused drug after the expiration date. NOTE: This sheet is a summary. It may not cover all possible information. If you have questions about this medicine, talk to your doctor, pharmacist, or health care provider.  2020 Elsevier/Gold Standard (2019-02-03 12:12:28)

## 2019-09-14 NOTE — Progress Notes (Signed)
Electrophysiology Office Note Date: 09/14/2019  ID:  Quanesha, Klimaszewski Dec 08, 1930, MRN [id number]  PCP: [name], MD Primary Cardiologist: Virl Axe, MD Electrophysiologist: Virl Axe, MD  CC: Pacemaker follow-up  [name] [name] is a 86 y.o. female seen today for Dr. Caryl Comes . she presents today for 2 week follow up for acute on chronic HFpEF. Since last being seen in our clinic, the patient reports feeling slightly better. She is frustrated that she continues to have edema in her feet. She can do everything she wants to do without difficulty, just has less energy. She is very anxious about her condition. she denies chest pain, palpitations, dyspnea, PND, orthopnea, nausea, vomiting, dizziness, syncope, edema, weight gain, or early satiety.  Device History: Medtronic Dual Chamber PPM implanted 07/2011 for SSS/Tachy-brady syndrome  Past Medical History:  Diagnosis Date  . CAD (coronary artery disease)    a. LHC 10/2017 30% RCA otherwise OK.  . Chronic diastolic CHF (congestive heart failure) ([place])   . Hemorrhoids    current problem as of 04/26/16  with slight bleeding of hemorrhoids   . Hepatitis    hx of medication induced hepatitis (Vytorin per pt)  . History of kidney stones   . HLD (hyperlipidemia)   . HTN (hypertension)   . Hx of echocardiogram    echo 1/13: EF 55%, Gr 2 diast dysfn, Asc Ao aneurysm 4.8 cm, mild MR, mod LAE, mild RAE  . Hypothyroidism   . ICH (intracerebral hemorrhage) ([place]) 03/24/15  . Lower extremity edema   . NASH (nonalcoholic steatohepatitis)   . Neuromuscular disorder ([place])   . Pacemaker-Medtronic 11/12/2011   Implanted 2013   . Permanent atrial fibrillation ([place])   . PMR (polymyalgia rheumatica) ([facility])    no steriods for 5 years  . sinus node dysfunction//post termination pauses   . Stroke ([place])   . Thoracic aortic aneurysm (TAA) ([place])    a. followed by Dr. Servando Snare.  . Tricuspid regurgitation   . Urinary tract infection    Recurrent  infections.    Past Surgical History:  Procedure Laterality Date  . CARDIAC CATHETERIZATION    . CARDIOVERSION  03/02/2012   Procedure: CARDIOVERSION;  Surgeon: Lelon Perla, MD;  Location: [facility] ENDOSCOPY;  Service: Cardiovascular;  Laterality: N/A;  . CARDIOVERSION  03/13/2012   Procedure: CARDIOVERSION;  Surgeon: Josue Hector, MD;  Location: [place];  Service: Cardiovascular;  Laterality: N/A;  . CARDIOVERSION  04/15/2012   Procedure: CARDIOVERSION;  Surgeon: [name], MD;  Location: [place];  Service: Cardiovascular;  Laterality: N/A;  . CARDIOVERSION N/A 06/07/2014   Procedure: CARDIOVERSION;  Surgeon: [name], MD;  Location: [facility] ENDOSCOPY;  Service: Cardiovascular;  Laterality: N/A;  . CARDIOVERSION N/A 08/18/2014   Procedure: CARDIOVERSION;  Surgeon: [name], MD;  Location: [place];  Service: Cardiovascular;  Laterality: N/A;  . CARDIOVERSION N/A 09/06/2015   Procedure: CARDIOVERSION;  Surgeon: Jerline Pain, MD;  Location: [place];  Service: Cardiovascular;  Laterality: N/A;  . CYSTOSCOPY WITH STENT PLACEMENT Right 04/22/2016   Procedure: CYSTOSCOPY WITH RIGHT URETERAL STENT INSERTION;  Surgeon: [name], MD;  Location: [place];  Service: Urology;  Laterality: Right;  . CYSTOSCOPY WITH URETEROSCOPY, STONE BASKETRY AND STENT PLACEMENT Right 04/30/2016   Procedure: CYSTOSCOPY WITH URETEROSCOPY, STENT PLACEMENT REMOVAL;  Surgeon: [name], MD;  Location: [facility];  Service: Urology;  Laterality: Right;  . HERNIA REPAIR    . INCISION AND DRAINAGE BREAST ABSCESS  1973  .  LEFT HEART CATH AND CORONARY ANGIOGRAPHY N/A 10/28/2017   Procedure: LEFT HEART CATH AND CORONARY ANGIOGRAPHY;  Surgeon: Lorretta Harp, MD;  Location: [place] CV LAB;  Service: Cardiovascular;  Laterality: N/A;  . PACEMAKER INSERTION  Jan 2013  . PERMANENT PACEMAKER INSERTION N/A 07/29/2011   Procedure: PERMANENT PACEMAKER INSERTION;  Surgeon: [name], MD;   Location: [facility] CATH LAB;  Service: Cardiovascular;  Laterality: N/A;  . UMBILICAL HERNIA REPAIR  1950's    Current Outpatient Medications  Medication Sig Dispense Refill  . Calcium Carbonate-Vitamin D (CALTRATE 600+D PO) Take 1 tablet by mouth daily.     . cetirizine (ZYRTEC) 10 MG tablet Take 10 mg by mouth daily as needed for allergies.     . Cholecalciferol (VITAMIN D3) 1000 UNITS tablet Take 1,000 Units by mouth daily.     Marland Kitchen diltiazem (CARDIZEM CD) 120 MG 24 hr capsule Take 1 capsule (120 mg total) by mouth 2 (two) times daily. 180 capsule 3  . ELIQUIS 5 MG TABS tablet TAKE 1 TABLET BY MOUTH TWICE DAILY 180 tablet 1  . furosemide (LASIX) 40 MG tablet Take 1.5 tablets (60 mg total) by mouth daily. 90 tablet 3  . labetalol (NORMODYNE) 300 MG tablet TAKE 1 TABLET BY MOUTH IN THE MORNING AND 1 & 1/2 TABLET IN THE EVENING. Please make yearly appt with Dr. Caryl Comes for May before anymore refills. 1st attempt 225 tablet 1  . levothyroxine (SYNTHROID, LEVOTHROID) 25 MCG tablet Take 25 mcg by mouth daily before breakfast.    . potassium chloride (KLOR-CON) 10 MEQ tablet Take 1 tablet (10 mEq total) by mouth daily.    Marland Kitchen spironolactone (ALDACTONE) 25 MG tablet Take 0.5 tablets (12.5 mg total) by mouth daily. 90 tablet 3  . WELCHOL 625 MG tablet Take 625 mg by mouth every morning and 1250 mg by mouth at bedtime.    Marland Kitchen losartan (COZAAR) 25 MG tablet Take 1 tablet (25 mg total) by mouth daily. 90 tablet 3   No current facility-administered medications for this visit.    Allergies:   Penicillins, Statins, Tylenol [acetaminophen], Amiodarone, Quinolones, Tape, and Codeine   Social History: Social History   Socioeconomic History  . Marital status: Married    Spouse name: Not on file  . Number of children: Not on file  . Years of education: Not on file  . Highest education level: Not on file  Occupational History  . Not on file  Tobacco Use  . Smoking status: Former Smoker    Types: Cigarettes     Quit date: 07/16/1990    Years since quitting: 29.1  . Smokeless tobacco: Never Used  Substance and Sexual Activity  . Alcohol use: No  . Drug use: No  . Sexual activity: Not on file  Other Topics Concern  . Not on file  Social History Narrative   Pt lives in [place] with spouse.  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 Difficulty of Paying Living Expenses: Not on file  Food Insecurity:   . Worried About Charity fundraiser in the Last Year: Not on file  . Ran Out of Food in the Last Year: Not on file  Transportation Needs:   . Lack of Transportation (Medical): Not on file  . Lack of Transportation (Non-Medical): Not on file  Physical Activity:   . Days of Exercise per Week: Not on file  . Minutes  of Exercise per Session: Not on file  Stress:   . Feeling of Stress : Not on file  Social Connections:   . Frequency of Communication with Friends and Family: Not on file  . Frequency of Social Gatherings with Friends and Family: Not on file  . Attends Religious Services: Not on file  . Active Member of Clubs or Organizations: Not on file  . Attends Archivist Meetings: Not on file  . Marital Status: Not on file  Intimate Partner Violence:   . Fear of Current or Ex-Partner: Not on file  . Emotionally Abused: Not on file  . Physically Abused: Not on file  . Sexually Abused: Not on file    Family History: Family History  Problem Relation Age of Onset  . Coronary artery disease Mother   . Coronary artery disease Other   . Alzheimer's disease Other   . Coronary artery disease Brother   . Coronary artery disease Brother      Review of Systems: All other systems reviewed and are otherwise negative except as noted above.  Physical Exam: Vitals:   09/14/19 1126  BP: (!) 150/82  Pulse: 96  SpO2: 99%  Weight: 163 lb (73.9 kg)  Height: 5' 3"  (1.6 m)    Wt Readings from Last 3  Encounters:  09/14/19 163 lb (73.9 kg)  08/31/19 162 lb (73.5 kg)  12/31/18 162 lb (73.5 kg)    GEN- The patient is well appearing, alert and oriented x 3 today.   HEENT: normocephalic, atraumatic; sclera clear, conjunctiva pink; hearing intact; oropharynx clear; neck supple  Lungs- Clear to ausculation bilaterally, normal work of breathing.  No wheezes, rales, rhonchi Heart- Regular rate and rhythm, no murmurs, rubs or gallops  GI- soft, non-tender, non-distended, bowel sounds present  Extremities- no clubbing or cyanosis. 1+ soft pedal edema.   MS- no significant deformity or atrophy Skin- warm and dry, no rash or lesion; PPM pocket well healed Psych- euthymic mood, full affect Neuro- strength and sensation are intact  PPM Interrogation- reviewed check from visit 2 weeks ago. No formal interrogation performed today.  EKG:  EKG is not ordered today.  Recent Labs: 09/21/2018: Hemoglobin 11.7; Platelets 231 07/14/2019: BUN 16; Creatinine, Ser 0.76; Potassium 3.7; Sodium 145   Wt Readings from Last 3 Encounters:  09/14/19 163 lb (73.9 kg)  08/31/19 162 lb (73.5 kg)  12/31/18 162 lb (73.5 kg)     Other studies Reviewed: Additional studies/ records that were reviewed today include: Echo 08/2019 shows LVEF 55-60%, Previous EP office notes, Previous remote checks, Most recent labwork.   Assessment and Plan:  1. HFpEF Echo 09/09/19 LVEF 55-60%,  LHC 10/2017 with mild, non-obstructive CAD Continue diltiazem Continue lasix 60 mg daily  Continue potassium 10 meq daily Continue spironolactone 12.5 mg daily. BMET today.   2. SSS/Tachy-brady s/p Medtronic PPM  Normal PPM function at last visit.  See Claudia Desanctis Art report from 08/31/2019 No changes today.  3. HTN Add losartan 25 mg daily as previously mentioned by Dr. Caryl Comes She will monitor BP at home  4. Permanent Atrial fibrillation Rate controlled Continue eliquis for CHA2DS2VASC of at least 6    5. Pedal edema This has  persisted. She does not have SOB, orthopnea, or elevated JVP. I feel chronic venous insuffiencey is a large contributor to this. I recommended salt and fluid restriction, as well as continued compression hose use.   6. Deconditioning She is very anxious about this. Multifactorial She denies SOB  or orthopnea. Her rates are well controlled in permanent afib. Denies bleeding Will check BMET, CBC, Mg, and TSH today.   Current medicines are reviewed at length with the patient today.   The patient does not have concerns regarding her medicines.  The following changes were made today:  Add losartan for BP  Labs/ tests ordered today include:  Orders Placed This Encounter  Procedures  . CBC  . Basic Metabolic Panel (BMET)  . Magnesium  . Thyroid Panel With TSH    Disposition:   Follow up with Dr. Caryl Comes in 6-8 weeks, virtually to discuss further per patient wishes.    Jacalyn Lefevre, PA-C  09/14/2019 12:03 PM  [place] [address] [phone] (office) [phone] (fax)

## 2019-10-26 ENCOUNTER — Telehealth: Payer: Self-pay

## 2019-10-26 NOTE — Telephone Encounter (Signed)
Pt called needing help with sending a transmission and I gave her tech support after trying multiple things. She will call back and let me know what happend

## 2019-10-26 NOTE — Telephone Encounter (Signed)
Spoke with pt again the company is going to send her a new monitor and she will call back when she gets it so that we can walk her through a transmission

## 2019-10-28 ENCOUNTER — Telehealth (INDEPENDENT_AMBULATORY_CARE_PROVIDER_SITE_OTHER): Payer: Medicare PPO | Admitting: Internal Medicine

## 2019-10-28 VITALS — Ht 63.0 in | Wt 159.0 lb

## 2019-10-28 DIAGNOSIS — I4821 Permanent atrial fibrillation: Secondary | ICD-10-CM | POA: Diagnosis not present

## 2019-10-28 DIAGNOSIS — Z950 Presence of cardiac pacemaker: Secondary | ICD-10-CM | POA: Diagnosis not present

## 2019-10-28 NOTE — Patient Instructions (Signed)
Medication Instructions:  Your physician recommends that you continue on your current medications as directed. Please refer to the Current Medication list given to you today.  Labwork: None ordered.  Testing/Procedures: None ordered.  Follow-Up: Your physician wants you to follow-up in: 6 months  You will receive a reminder letter in the mail two months in advance. If you don't receive a letter, please call our office to schedule the follow-up appointment.  Remote monitoring is used to monitor your Pacemaker of ICD from home. This monitoring reduces the number of office visits required to check your device to one time per year. It allows [place] to keep an eye on the functioning of your device to ensure it is working properly.   Any Other Special Instructions Will Be Listed Below (If Applicable).  If you need a refill on your cardiac medications before your next appointment, please call your pharmacy.

## 2019-10-28 NOTE — Progress Notes (Signed)
Electrophysiology TeleHealth Note   Due to national recommendations of social distancing due to COVID 19, an audio/video telehealth visit is felt to be most appropriate for this patient at this time.  See MyChart message from today for the patient's consent to telehealth for [name] [name].   Date:  10/28/2019   ID:  [name] [name], DOB 07/09/1931, MRN [id number]  Location: patient's home  Provider location: [address], [place]  Evaluation Performed: Follow-up visit  PCP:  [name], MD  Cardiologist:    Electrophysiologist:  SK   Chief Complaint:     History of Present Illness:    [name] [name] is a 86 y.o. female who presents via audio/video conferencing for a telehealth visit today.  Since last being seen in our clinic for afib and edema in setting of bradycardia and Medtronic pacemaker, the patient reports having seen [facility] 2x in the last 2 months.  Continues to complain of swelling--ankles --began a few months ago.    Improved diuresis on 43m --edema much improved   Some orthostatic LH , and also with head twisting    Some palps but settlied down  Diet fluid replete >2 L+  Drinking to improve urination   No chest pain    The patient denies symptoms of fevers, chills, cough, or new SOB worrisome for COVID 19.    Past Medical History:  Diagnosis Date  . CAD (coronary artery disease)    a. LHC 10/2017 30% RCA otherwise OK.  . Chronic diastolic CHF (congestive heart failure) ([facility]   . Hemorrhoids    current problem as of 04/26/16  with slight bleeding of hemorrhoids   . Hepatitis    hx of medication induced hepatitis (Vytorin per pt)  . History of kidney stones   . HLD (hyperlipidemia)   . HTN (hypertension)   . Hx of echocardiogram    echo 1/13: EF 55%, Gr 2 diast dysfn, Asc Ao aneurysm 4.8 cm, mild MR, mod LAE, mild RAE  . Hypothyroidism   . ICH (intracerebral hemorrhage) ([facility] 03/24/15  . Lower extremity edema   . NASH (nonalcoholic  steatohepatitis)   . Neuromuscular disorder ([facility]   . Pacemaker-Medtronic 11/12/2011   Implanted 2013   . Permanent atrial fibrillation ([facility]   . PMR (polymyalgia rheumatica) (H[facility])    no steriods for 5 years  . sinus node dysfunction//post termination pauses   . Stroke ([facility]   . Thoracic aortic aneurysm (TAA) ([facility]    a. followed by Dr. GServando Snare  . Tricuspid regurgitation   . Urinary tract infection    Recurrent infections.     Past Surgical History:  Procedure Laterality Date  . CARDIAC CATHETERIZATION    . CARDIOVERSION  03/02/2012   Procedure: CARDIOVERSION;  Surgeon: BLelon Perla MD;  [facility];  Service: Cardiovascular;  Laterality: N/A;  . CARDIOVERSION  03/13/2012   Procedure: CARDIOVERSION;  Surgeon: PJosue Hector MD;  [facility];  Service: Cardiovascular;  Laterality: N/A;  . CARDIOVERSION  04/15/2012   Procedure: CARDIOVERSION;  Surgeon: TDarlin Coco MD;  [facility];  Service: Cardiovascular;  Laterality: N/A;  . CARDIOVERSION N/A 06/07/2014   Procedure: CARDIOVERSION;  Surgeon: KPixie Casino MD;  [facility];  Service: Cardiovascular;  Laterality: N/A;  . CARDIOVERSION N/A 08/18/2014   Procedure: CARDIOVERSION;  Surgeon: KDorothy Spark MD;  [place] [facility]  Service: Cardiovascular;  Laterality: N/A;  . CARDIOVERSION N/A 09/06/2015   Procedure:  CARDIOVERSION;  Surgeon: Jerline Pain, MD;  Location: [facility];  Service: Cardiovascular;  Laterality: N/A;  . CYSTOSCOPY WITH STENT PLACEMENT Right 04/22/2016   Procedure: CYSTOSCOPY WITH RIGHT URETERAL STENT INSERTION;  Surgeon: [name], MD;  Location: [place];  Service: Urology;  Laterality: Right;  . CYSTOSCOPY WITH URETEROSCOPY, STONE BASKETRY AND STENT PLACEMENT Right 04/30/2016   Procedure: CYSTOSCOPY WITH URETEROSCOPY, STENT PLACEMENT REMOVAL;  Surgeon: [name], MD;  [facility];  Service: Urology;  Laterality: Right;  . HERNIA REPAIR    .  INCISION AND DRAINAGE BREAST ABSCESS  1973  . LEFT HEART CATH AND CORONARY ANGIOGRAPHY N/A 10/28/2017   Procedure: LEFT HEART CATH AND CORONARY ANGIOGRAPHY;  Surgeon: Lorretta Harp, MD;  Location: [facility];  Service: Cardiovascular;  Laterality: N/A;  . PACEMAKER INSERTION  Jan 2013  . PERMANENT PACEMAKER INSERTION N/A 07/29/2011   Procedure: PERMANENT PACEMAKER INSERTION;  Surgeon: [name], MD;  Location: [facility] CATH LAB;  Service: Cardiovascular;  Laterality: N/A;  . UMBILICAL HERNIA REPAIR  1950's    Current Outpatient Medications  Medication Sig Dispense Refill  . Calcium Carbonate-Vitamin D (CALTRATE 600+D PO) Take 1 tablet by mouth daily.     . cetirizine (ZYRTEC) 10 MG tablet Take 10 mg by mouth daily as needed for allergies.     . Cholecalciferol (VITAMIN D3) 1000 UNITS tablet Take 1,000 Units by mouth daily.     Marland Kitchen diltiazem (CARDIZEM CD) 120 MG 24 hr capsule Take 1 capsule (120 mg total) by mouth 2 (two) times daily. 180 capsule 3  . ELIQUIS 5 MG TABS tablet TAKE 1 TABLET BY MOUTH TWICE DAILY 180 tablet 1  . furosemide (LASIX) 40 MG tablet Take 1.5 tablets (60 mg total) by mouth daily. 90 tablet 3  . labetalol (NORMODYNE) 300 MG tablet TAKE 1 TABLET BY MOUTH IN THE MORNING AND 1 & 1/2 TABLET IN THE EVENING. Please make yearly appt with Dr. Caryl Comes for May before anymore refills. 1st attempt 225 tablet 1  . levothyroxine (SYNTHROID, LEVOTHROID) 25 MCG tablet Take 25 mcg by mouth daily before breakfast.    . losartan (COZAAR) 25 MG tablet Take 1 tablet (25 mg total) by mouth daily. 90 tablet 3  . potassium chloride (KLOR-CON) 10 MEQ tablet Take 1 tablet (10 mEq total) by mouth daily.    Marland Kitchen spironolactone (ALDACTONE) 25 MG tablet Take 0.5 tablets (12.5 mg total) by mouth daily. 90 tablet 3  . WELCHOL 625 MG tablet Take 625 mg by mouth every morning and 1250 mg by mouth at bedtime.     No current facility-administered medications for this visit.    Allergies:    Penicillins, Statins, Tylenol [acetaminophen], Amiodarone, Quinolones, Tape, and Codeine   Social History:  The patient  reports that she quit smoking about 29 years ago. Her smoking use included cigarettes. She has never used smokeless tobacco. She reports that she does not drink alcohol or use drugs.   Family History:  The patient's   family history includes Alzheimer's disease in an other family member; Coronary artery disease in her brother, brother, mother, and another family member.   ROS:  Please see the history of present illness.   All other systems are personally reviewed and negative.    Exam:    Vital Signs:  Ht 5' 3"  (1.6 m)   Wt 159 lb (72.1 kg)   BMI 28.17 kg/m      Labs/Other Tests and Data Reviewed:  Recent Labs: 09/14/2019: BUN 30; Creatinine, Ser 0.89; Hemoglobin 12.6; Magnesium 1.9; Platelets 177; Potassium 4.1; Sodium 139; TSH 1.660   Wt Readings from Last 3 Encounters:  10/28/19 159 lb (72.1 kg)  09/14/19 163 lb (73.9 kg)  08/31/19 162 lb (73.5 kg)     Other studies personally reviewed: Additional studies/ records that were reviewed today include: As above    Last device remote is reviewed from [place] PDF dated 2/21 which reveals normal device function,   arrhythmias - afib permanent Reasonable heart rate excursion Battery > yrs  ASSESSMENT & PLAN:    Atrial fibrillaton permanent   Thoracic aortic aneurysm  Hypertension   Pacemaker    HFpEF  Anemia  Palpitations  Bruises    We have discussed the physiology of heart failure including the importance of salt restriction and fluid restriction and have reviewed sources of dietary salt and water.   Once back to euvolemic would take lasix QOD  BP still 140s higher still higher at AM  Losartan recently started but will switch to taking at night    Bruising in the setting of Apixoban -- CBC normal 3/21   Recurrent palpitations--but afib permanent  So not sure I can explain  it     COVID 19 screen The patient denies symptoms of COVID 19 at this time.  The importance of social distancing was discussed today.  Follow-up:  55mNext remote: As Scheduled   Current medicines are reviewed at length with the patient today.   The patient does not have concerns regarding her medicines.  The following changes were made today:  none  Labs/ tests ordered today include:   No orders of the defined types were placed in this encounter.   Future tests ( post COVID )     Patient Risk:  after full review of this patients clinical status, I feel that they are at moderate risk at this time.  Today, I have spent 24 minutes with the patient with telehealth technology discussing the above.  Signed, SVirl Axe MD  10/28/2019 3:24 PM     [place] [phone]) [phone])

## 2019-10-29 ENCOUNTER — Ambulatory Visit (INDEPENDENT_AMBULATORY_CARE_PROVIDER_SITE_OTHER): Payer: Medicare PPO | Admitting: *Deleted

## 2019-10-29 DIAGNOSIS — I5032 Chronic diastolic (congestive) heart failure: Secondary | ICD-10-CM | POA: Diagnosis not present

## 2019-10-31 LAB — CUP PACEART REMOTE DEVICE CHECK
Battery Impedance: 972 Ohm
Battery Remaining Longevity: 74 mo
Battery Voltage: 2.78 V
Brady Statistic RV Percent Paced: 30 %
Date Time Interrogation Session: 20210417103354
Implantable Lead Implant Date: 20130114
Implantable Lead Implant Date: 20130114
Implantable Lead Location: 753859
Implantable Lead Location: 753860
Implantable Lead Model: 1948
Implantable Lead Model: 5076
Implantable Pulse Generator Implant Date: 20130114
Lead Channel Impedance Value: 67 Ohm
Lead Channel Impedance Value: 697 Ohm
Lead Channel Pacing Threshold Amplitude: 0.625 V
Lead Channel Pacing Threshold Pulse Width: 0.4 ms
Lead Channel Setting Pacing Amplitude: 2.5 V
Lead Channel Setting Pacing Pulse Width: 0.4 ms
Lead Channel Setting Sensing Sensitivity: 5.6 mV

## 2019-11-01 NOTE — Progress Notes (Signed)
PPM Remote  

## 2019-11-02 DIAGNOSIS — N764 Abscess of vulva: Secondary | ICD-10-CM | POA: Diagnosis not present

## 2019-11-02 DIAGNOSIS — R1909 Other intra-abdominal and pelvic swelling, mass and lump: Secondary | ICD-10-CM | POA: Diagnosis not present

## 2019-11-08 ENCOUNTER — Telehealth: Payer: Self-pay | Admitting: Internal Medicine

## 2019-11-08 NOTE — Telephone Encounter (Signed)
Spoke with pt who states she continues to have feet and ankle swelling.  Pt states swelling mostly resolves overnight while sleeping but almost immediately returns after getting out of bed in the am.  Pt states she does not have a pair of shoes she can wear because of the swelling.  Pt last saw Oda Kilts, [place] and has increased her Lasix as advised.  Pt continues to monitor her Na+ intake and has limited her fluid intake to about 4 8oz bottles of water daily as recommended per Dr Caryl Comes.  She does admit to having about 4 oz of Dr Malachi Bonds daily.  Pt elevates her feet in the evening but is up on her feet most of the day.  Pt denies weight gain, SOB or CP at this time.  She is not sure what else to do at this point to help with the swelling.  Will forward information to Rebecca Eaton as Dr Caryl Comes is out of the office this week.  Pt advised if she develops SOB or SOB she will need to go to ED for further evaluation.  Pt verbalizes understanding and agrees with current plan.

## 2019-11-08 NOTE — Telephone Encounter (Signed)
Pt c/o swelling: STAT is pt has developed SOB within 24 hours  1) How much weight have you gained and in what time span? Weight stays constant ~159  2) If swelling, where is the swelling located? Feet and ankles  3) Are you currently taking a fluid pill? Yes   4) Are you currently SOB?  no  5) Do you have a log of your daily weights (if so, list)? Pt says her weight stays constant between 158-159 but the swelling gets worse  6) Have you gained 3 pounds in a day or 5 pounds in a week? no  7) Have you traveled recently? No  Patient says sometimes the lasix doesn't seem to be working. Her weight remains constant but the size of her feet and ankles tends to fluctuate. She is not sure what she needs to do

## 2019-11-09 DIAGNOSIS — N764 Abscess of vulva: Secondary | ICD-10-CM | POA: Diagnosis not present

## 2019-11-09 NOTE — Telephone Encounter (Signed)
Spoke with pt and reviewed Marchelle Folks, PA recommendations as listed below..  Pt states she wears compression hose most days, continues to watch her salt and fluid intake.  Pt is currently taking Lasix 78m daily and will increase to 8479mdaily x 2 days.  Appointment scheduled for 11/12/2019 at 1005am.  Pt also reports multiple bruising of her arms and legs.  Pt takes Eliquis 79m33mid. Pt again denies CP, SOB or weight gain today.   Pt verbalizes understanding all instructions and agrees with current plan.

## 2019-11-09 NOTE — Telephone Encounter (Signed)
I would be glad to see her in person later this week or next to further discuss any of the following.   Please recommend compression hose as often as she can tolerate them if she is not already doing so.   She should limit sodium intake to < 2000 mg daily, and should avoid meals that have > 25% (500 mg) of daily intake in 1 serving.   Please re-iterate that 4 8 oz bottles of water is NOT a goal but a limit, and she should not force herself to drink 4 bottles daily, but that is the most she should drink.  In general, she should only drink when she is thirsty, while also sticking to that limit. If she is drinking a different type of fluid (Dr. Malachi Bonds, coffee, tea, mild) than that can replace one of those bottles of water.  She should probably limit her total intake to about 1500 mL ( about 50 oz of TOTAL fluid a day).  She has at least a component of a condition known as chronic venous stasis which can be uncomfortable, but is not life threatening. Please re-assure her if she can comfortably control her swelling with salt/fluid restriction, diuretics, and compression hose, this is all that is needed.   If she is uncomfortable today, she may take extra lasix. (If she is currently continuing to take lasix 60 mg daily, she may take 80 mg daily (2 tabs total, for 2 days). If she has gone back to taking 1 tablet (40 mg daily) she should take 60 mg for 2 days.   Legrand Como [address] [place], PA-C  11/09/2019 11:03 AM

## 2019-11-12 ENCOUNTER — Other Ambulatory Visit: Payer: Self-pay

## 2019-11-12 ENCOUNTER — Encounter: Payer: Self-pay | Admitting: Student

## 2019-11-12 ENCOUNTER — Ambulatory Visit: Payer: Medicare PPO | Admitting: Student

## 2019-11-12 VITALS — BP 140/72 | HR 77 | Ht 63.0 in | Wt 156.4 lb

## 2019-11-12 DIAGNOSIS — Z7901 Long term (current) use of anticoagulants: Secondary | ICD-10-CM

## 2019-11-12 DIAGNOSIS — I4821 Permanent atrial fibrillation: Secondary | ICD-10-CM

## 2019-11-12 DIAGNOSIS — I872 Venous insufficiency (chronic) (peripheral): Secondary | ICD-10-CM | POA: Diagnosis not present

## 2019-11-12 DIAGNOSIS — I495 Sick sinus syndrome: Secondary | ICD-10-CM

## 2019-11-12 DIAGNOSIS — Z79899 Other long term (current) drug therapy: Secondary | ICD-10-CM

## 2019-11-12 DIAGNOSIS — I10 Essential (primary) hypertension: Secondary | ICD-10-CM

## 2019-11-12 DIAGNOSIS — I5032 Chronic diastolic (congestive) heart failure: Secondary | ICD-10-CM | POA: Diagnosis not present

## 2019-11-12 LAB — CUP PACEART INCLINIC DEVICE CHECK
Battery Impedance: 946 Ohm
Battery Remaining Longevity: 75 mo
Battery Voltage: 2.77 V
Brady Statistic RV Percent Paced: 31 %
Date Time Interrogation Session: 20210430104229
Implantable Lead Implant Date: 20130114
Implantable Lead Implant Date: 20130114
Implantable Lead Location: 753859
Implantable Lead Location: 753860
Implantable Lead Model: 1948
Implantable Lead Model: 5076
Implantable Pulse Generator Implant Date: 20130114
Lead Channel Impedance Value: 67 Ohm
Lead Channel Impedance Value: 709 Ohm
Lead Channel Pacing Threshold Amplitude: 0.5 V
Lead Channel Pacing Threshold Amplitude: 0.625 V
Lead Channel Pacing Threshold Pulse Width: 0.4 ms
Lead Channel Pacing Threshold Pulse Width: 0.4 ms
Lead Channel Sensing Intrinsic Amplitude: 22.4 mV
Lead Channel Setting Pacing Amplitude: 2.5 V
Lead Channel Setting Pacing Pulse Width: 0.4 ms
Lead Channel Setting Sensing Sensitivity: 5.6 mV

## 2019-11-12 LAB — BASIC METABOLIC PANEL
BUN/Creatinine Ratio: 13 (ref 12–28)
BUN: 19 mg/dL (ref 8–27)
CO2: 20 mmol/L (ref 20–29)
Calcium: 9.8 mg/dL (ref 8.7–10.3)
Chloride: 102 mmol/L (ref 96–106)
Creatinine, Ser: 1.49 mg/dL — ABNORMAL HIGH (ref 0.57–1.00)
GFR calc Af Amer: 36 mL/min/{1.73_m2} — ABNORMAL LOW (ref >59)
GFR calc non Af Amer: 32 mL/min/{1.73_m2} — ABNORMAL LOW (ref >59)
Glucose: 83 mg/dL (ref 65–99)
Potassium: 4.5 mmol/L (ref 3.5–5.2)
Sodium: 137 mmol/L (ref 134–144)

## 2019-11-12 MED ORDER — SPIRONOLACTONE 25 MG PO TABS: 25.0000 mg | ORAL_TABLET | Freq: Every day | ORAL | 3 refills | Status: DC

## 2019-11-12 NOTE — Patient Instructions (Addendum)
Medication Instructions:  INCREASE SPIRONOLACTONE TO 25 mg Daily (1 TABLET) *If you need a refill on your cardiac medications before your next appointment, please call your pharmacy*   Lab Work:  TODAY BMET  BMET IN 2 WEEKS (PLEASE SCHEDULE) If you have labs (blood work) drawn today and your tests are completely normal, you will receive your results only by: Marland Kitchen MyChart Message (if you have MyChart) OR . A paper copy in the mail If you have any lab test that is abnormal or we need to change your treatment, we will call you to review the results.   Testing/Procedures: none   Follow-Up: At [facility], you and your health needs are our priority.  As part of our continuing mission to provide you with exceptional heart care, we have created designated Provider Care Teams.  These Care Teams include your primary Cardiologist (physician) and Advanced Practice Providers (APPs -  Physician Assistants and Nurse Practitioners) who all work together to provide you with the care you need, when you need it.  We recommend signing up for the patient portal called "MyChart".  Sign up information is provided on this After Visit Summary.  MyChart is used to connect with patients for Virtual Visits (Telemedicine).  Patients are able to view lab/test results, encounter notes, upcoming appointments, etc.  Non-urgent messages can be sent to your provider as well.   To learn more about what you can do with MyChart, go to [email or web address].     Other Instructions Remote monitoring is used to monitor your Pacemaker  from home. This monitoring reduces the number of office visits required to check your device to one time per year. It allows [place] to keep an eye on the functioning of your device to ensure it is working properly. You are scheduled for a device check from home on 01/28/20. You may send your transmission at any time that day. If you have a wireless device, the transmission will be sent automatically.  After your physician reviews your transmission, you will receive a postcard with your next transmission date.

## 2019-11-12 NOTE — Progress Notes (Signed)
Electrophysiology Office Note Date: 11/12/2019  ID:  [name], [name] 1931/06/21, MRN [id number]  PCP: [name], MD Primary Cardiologist: Virl Axe, MD Electrophysiologist: Virl Axe, MD  CC: Pacemaker follow-up  [name] [name] is a 86 y.o. female seen today for Dr. Caryl Comes . She presented today for visit due to foot and ankle swelling. I instructed her to take an extra half of lasix for two days, and she is so excited to have been able to fit into her normal shoes today. She still has mild, chronic, "puffiness" of her Right foot, but it does not extend up into the ankles.  She also admits to drinking "way too much water" and thought someone had told her she needed to drink a lot of water while on lasix. She denies symptoms of palpitations, chest pain, shortness of breath, orthopnea, PND, claudication, dizziness, presyncope, syncope, bleeding, or neurologic sequela. The patient is tolerating medications without difficulties.   Device History: Medtronic Dual Chamber PPM implanted 07/2011 for SSS/Tachy-brady syndrome  Past Medical History:  Diagnosis Date  . CAD (coronary artery disease)    a. LHC 10/2017 30% RCA otherwise OK.  . Chronic diastolic CHF (congestive heart failure) ([place])   . Hemorrhoids    current problem as of 04/26/16  with slight bleeding of hemorrhoids   . Hepatitis    hx of medication induced hepatitis (Vytorin per pt)  . History of kidney stones   . HLD (hyperlipidemia)   . HTN (hypertension)   . Hx of echocardiogram    echo 1/13: EF 55%, Gr 2 diast dysfn, Asc Ao aneurysm 4.8 cm, mild MR, mod LAE, mild RAE  . Hypothyroidism   . ICH (intracerebral hemorrhage) ([place]) 03/24/15  . Lower extremity edema   . NASH (nonalcoholic steatohepatitis)   . Neuromuscular disorder ([place])   . Pacemaker-Medtronic 11/12/2011   Implanted 2013   . Permanent atrial fibrillation ([place])   . PMR (polymyalgia rheumatica) ([facility])    no steriods for 5 years  . sinus node  dysfunction//post termination pauses   . Stroke ([place])   . Thoracic aortic aneurysm (TAA) ([place])    a. followed by Dr. Servando Snare.  . Tricuspid regurgitation   . Urinary tract infection    Recurrent infections.    Past Surgical History:  Procedure Laterality Date  . CARDIAC CATHETERIZATION    . CARDIOVERSION  03/02/2012   Procedure: CARDIOVERSION;  Surgeon: Lelon Perla, MD;  Location: [facility] ENDOSCOPY;  Service: Cardiovascular;  Laterality: N/A;  . CARDIOVERSION  03/13/2012   Procedure: CARDIOVERSION;  Surgeon: Josue Hector, MD;  Location: [place];  Service: Cardiovascular;  Laterality: N/A;  . CARDIOVERSION  04/15/2012   Procedure: CARDIOVERSION;  Surgeon: [name], MD;  Location: [place];  Service: Cardiovascular;  Laterality: N/A;  . CARDIOVERSION N/A 06/07/2014   Procedure: CARDIOVERSION;  Surgeon: [name], MD;  Location: [facility] ENDOSCOPY;  Service: Cardiovascular;  Laterality: N/A;  . CARDIOVERSION N/A 08/18/2014   Procedure: CARDIOVERSION;  Surgeon: [name], MD;  Location: [place];  Service: Cardiovascular;  Laterality: N/A;  . CARDIOVERSION N/A 09/06/2015   Procedure: CARDIOVERSION;  Surgeon: Jerline Pain, MD;  Location: [place];  Service: Cardiovascular;  Laterality: N/A;  . CYSTOSCOPY WITH STENT PLACEMENT Right 04/22/2016   Procedure: CYSTOSCOPY WITH RIGHT URETERAL STENT INSERTION;  Surgeon: [name], MD;  Location: [place];  Service: Urology;  Laterality: Right;  . CYSTOSCOPY WITH URETEROSCOPY, STONE BASKETRY AND STENT PLACEMENT Right 04/30/2016   Procedure:  CYSTOSCOPY WITH URETEROSCOPY, STENT PLACEMENT REMOVAL;  Surgeon: [name], MD;  Location: [facility];  Service: Urology;  Laterality: Right;  . HERNIA REPAIR    . INCISION AND DRAINAGE BREAST ABSCESS  1973  . LEFT HEART CATH AND CORONARY ANGIOGRAPHY N/A 10/28/2017   Procedure: LEFT HEART CATH AND CORONARY ANGIOGRAPHY;  Surgeon: Lorretta Harp, MD;  Location: [place] CV LAB;  Service:  Cardiovascular;  Laterality: N/A;  . PACEMAKER INSERTION  Jan 2013  . PERMANENT PACEMAKER INSERTION N/A 07/29/2011   Procedure: PERMANENT PACEMAKER INSERTION;  Surgeon: [name], MD;  Location: [facility] C[facility] LAB;  Service: Cardiovascular;  Laterality: N/A;  . UMBILICAL HERNIA REPAIR  1950's    Current Outpatient Medications  Medication Sig Dispense Refill  . Ascorbic Acid (VITAMIN C) 500 MG CAPS Take by mouth daily.    . Calcium Carbonate-Vitamin D (CALTRATE 600+D PO) Take 1 tablet by mouth daily.     . cetirizine (ZYRTEC) 10 MG tablet Take 10 mg by mouth daily as needed for allergies.     . Cholecalciferol (VITAMIN D3) 1000 UNITS tablet Take 1,000 Units by mouth daily.     Marland Kitchen diltiazem (CARDIZEM CD) 120 MG 24 hr capsule Take 1 capsule (120 mg total) by mouth 2 (two) times daily. 180 capsule 3  . ELIQUIS 5 MG TABS tablet TAKE 1 TABLET BY MOUTH TWICE DAILY 180 tablet 1  . furosemide (LASIX) 40 MG tablet Take 1.5 tablets (60 mg total) by mouth daily. 90 tablet 3  . labetalol (NORMODYNE) 300 MG tablet TAKE 1 TABLET BY MOUTH IN THE MORNING AND 1 & 1/2 TABLET IN THE EVENING. Please make yearly appt with Dr. Caryl Comes for [place] before anymore refills. 1st attempt 225 tablet 1  . levothyroxine (SYNTHROID, LEVOTHROID) 25 MCG tablet Take 25 mcg by mouth daily before breakfast.    . losartan (COZAAR) 25 MG tablet Take 1 tablet (25 mg total) by mouth daily. 90 tablet 3  . potassium chloride (KLOR-CON) 10 MEQ tablet Take 1 tablet (10 mEq total) by mouth daily.    Marland Kitchen sulfamethoxazole-trimethoprim (BACTRIM DS) 800-160 MG tablet 2 (two) times daily. For 7 days    . WELCHOL 625 MG tablet Take 625 mg by mouth every morning and 1250 mg by mouth at bedtime.    Marland Kitchen spironolactone (ALDACTONE) 25 MG tablet Take 1 tablet (25 mg total) by mouth daily. 90 tablet 3   No current facility-administered medications for this visit.    Allergies:   Penicillins, Statins, Tylenol [acetaminophen], Amiodarone, Quinolones, Tape, and  Codeine   Social History: Social History   Socioeconomic History  . Marital status: Married    Spouse name: Not on file  . Number of children: Not on file  . Years of education: Not on file  . Highest education level: Not on file  Occupational History  . Not on file  Tobacco Use  . Smoking status: Former Smoker    Types: Cigarettes    Quit date: 07/16/1990    Years since quitting: 29.3  . Smokeless tobacco: Never Used  Substance and Sexual Activity  . Alcohol use: No  . Drug use: No  . Sexual activity: Not on file  Other Topics Concern  . Not on file  Social History Narrative   Pt lives in [place] with spouse.  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 Difficulty of Paying Living Expenses:   Food Insecurity:   .  Worried About Charity fundraiser in the Last Year:   . Arboriculturist in the Last Year:   Transportation Needs:   . Film/video editor (Medical):   Marland Kitchen Lack of Transportation (Non-Medical):   Physical Activity:   . Days of Exercise per Week:   . Minutes of Exercise per Session:   Stress:   . Feeling of Stress :   Social Connections:   . Frequency of Communication with Friends and Family:   . Frequency of Social Gatherings with Friends and Family:   . Attends Religious Services:   . Active Member of Clubs or Organizations:   . Attends Archivist Meetings:   Marland Kitchen Marital Status:   Intimate Partner Violence:   . Fear of Current or Ex-Partner:   . Emotionally Abused:   Marland Kitchen Physically Abused:   . Sexually Abused:     Family History: Family History  Problem Relation Age of Onset  . Coronary artery disease Mother   . Coronary artery disease Other   . Alzheimer's disease Other   . Coronary artery disease Brother   . Coronary artery disease Brother      Review of Systems: Review of systems complete and found to be negative unless listed in HPI.    Physical  Exam: Vitals:   11/12/19 1016  BP: 140/72  Pulse: 77  SpO2: 98%  Weight: 156 lb 6.4 oz (70.9 kg)  Height: 5' 3"  (1.6 m)    Wt Readings from Last 3 Encounters:  11/12/19 156 lb 6.4 oz (70.9 kg)  10/28/19 159 lb (72.1 kg)  09/14/19 163 lb (73.9 kg)   General: Well appearing. No resp difficulty. HEENT: Normal Neck: Supple. JVP 5-6. Carotids 2+ bilat; no bruits. No thyromegaly or nodule noted. Cor: PMI nondisplaced. RRR, No M/G/R noted; PPM pocket well healed.  Lungs: CTAB, normal effort. Abdomen: Soft, non-tender, non-distended, no HSM. No bruits or masses. +BS  Extremities: No cyanosis, clubbing, or rash. R and LLE no edema.  Neuro: Alert & orientedx3, cranial nerves grossly intact. moves all 4 extremities w/o difficulty. Affect pleasant  PPM Interrogation- No changes today. See PaceArt.   EKG:  EKG is not ordered today.   Recent Labs: 09/14/2019: BUN 30; Creatinine, Ser 0.89; Hemoglobin 12.6; Magnesium 1.9; Platelets 177; Potassium 4.1; Sodium 139; TSH 1.660   Wt Readings from Last 3 Encounters:  11/12/19 156 lb 6.4 oz (70.9 kg)  10/28/19 159 lb (72.1 kg)  09/14/19 163 lb (73.9 kg)     Other studies Reviewed: Additional studies/ records that were reviewed today include: Echo 08/2019 LVEF 55-60%, Previous EP office notes, phone notes, previous remotes, most recent [place].   Assessment and Plan:  1. HFpEF EF normal 08/2019 Previous LHC 10/2017 with mild, non-obstructive CAD Continue diltiazem Continue lasix 60 mg daily  Continue potassium 10 meq daily Increase spironolactone to 25 mg daily. BMET today and 10 weeks. Can consider stopping K dur if K levels start to rise.   2. SSS/Tachy-brady s/p Medtronic PPM  Normal PPM function See PaceArt report No changes today.   3. HTN Continue losartan 25 mg daily   4. Permanent Atrial fibrillation Rate controlled.  Continue eliquis for CHA2DS2VASC of at least 6    5. Chronic venous insufficiency Denies SOB, orthopnea.  Resolves overnight and quickly returns in the am. Difficult for her to find shoes that fit well.  She feels better with a couple doses of extra lasix.  Continued to encourage salt and fluid  restriction Continue compression hose.  Increasing spiro as above.  Pt knows to let us know of any worsening or severe lightheadedness. As of now, she has very little of this.   6. Deconditioning Recent labwork stable. No SOB or orthopnea.   Current medicines are reviewed at length with the patient today.   The patient does not have concerns regarding her medicines.  The following changes were made today:  Spironolactone increased to one full 25 mg tablet.   Labs/ tests ordered today include:  Orders Placed This Encounter  Procedures  . Basic Metabolic Panel (BMET)  . Basic Metabolic Panel (BMET)  . CUP PACEART INCLINIC DEVICE CHECK   Disposition:   Follow up with Dr. Caryl Comes in 6 months as scheduled for recall.  BMET 2 weeks with spiro increase  Signed, Annamaria Helling  11/12/2019 10:46 AM  [facility] [address] [place] [address] [phone] (office) [phone] (fax)

## 2019-11-15 ENCOUNTER — Telehealth: Payer: Self-pay

## 2019-11-15 NOTE — Telephone Encounter (Signed)
-----   Message from Shirley Friar, PA-C sent at 11/15/2019  8:43 AM EDT ----- Creatinine was elevated in the setting of taking extra doses x 2 days prior.  Will you please have her hold ONE dose of lasix, and with increase of spiro can STOP potassium supp.   BMET next week as previously scheduled.   Legrand Como [address] [place], PA-C  11/15/2019 8:43 AM

## 2019-11-15 NOTE — Telephone Encounter (Signed)
The patient has been notified of the lab result and verbalized understanding.  All questions (if any) were answered. Frederik Schmidt, RN 11/15/2019 11:01 AM

## 2019-11-24 DIAGNOSIS — N764 Abscess of vulva: Secondary | ICD-10-CM | POA: Diagnosis not present

## 2019-11-25 DIAGNOSIS — M816 Localized osteoporosis [Lequesne]: Secondary | ICD-10-CM | POA: Diagnosis not present

## 2019-11-25 DIAGNOSIS — N958 Other specified menopausal and perimenopausal disorders: Secondary | ICD-10-CM | POA: Diagnosis not present

## 2019-11-26 ENCOUNTER — Other Ambulatory Visit: Payer: Self-pay | Admitting: *Deleted

## 2019-11-26 ENCOUNTER — Other Ambulatory Visit: Payer: Self-pay

## 2019-11-26 ENCOUNTER — Other Ambulatory Visit: Payer: Medicare PPO | Admitting: *Deleted

## 2019-11-26 DIAGNOSIS — Z79899 Other long term (current) drug therapy: Secondary | ICD-10-CM | POA: Diagnosis not present

## 2019-11-26 DIAGNOSIS — I4821 Permanent atrial fibrillation: Secondary | ICD-10-CM | POA: Diagnosis not present

## 2019-11-26 DIAGNOSIS — I712 Thoracic aortic aneurysm, without rupture, unspecified: Secondary | ICD-10-CM

## 2019-11-26 DIAGNOSIS — I10 Essential (primary) hypertension: Secondary | ICD-10-CM

## 2019-11-27 LAB — BASIC METABOLIC PANEL
BUN/Creatinine Ratio: 18 (ref 12–28)
BUN: 17 mg/dL (ref 8–27)
CO2: 24 mmol/L (ref 20–29)
Calcium: 10.2 mg/dL (ref 8.7–10.3)
Chloride: 106 mmol/L (ref 96–106)
Creatinine, Ser: 0.92 mg/dL (ref 0.57–1.00)
GFR calc Af Amer: 65 mL/min/{1.73_m2} (ref >59)
GFR calc non Af Amer: 57 mL/min/{1.73_m2} — ABNORMAL LOW (ref >59)
Glucose: 84 mg/dL (ref 65–99)
Potassium: 4 mmol/L (ref 3.5–5.2)
Sodium: 141 mmol/L (ref 134–144)

## 2019-11-30 DIAGNOSIS — M7062 Trochanteric bursitis, left hip: Secondary | ICD-10-CM | POA: Diagnosis not present

## 2019-11-30 DIAGNOSIS — M25562 Pain in left knee: Secondary | ICD-10-CM | POA: Diagnosis not present

## 2019-11-30 DIAGNOSIS — M1711 Unilateral primary osteoarthritis, right knee: Secondary | ICD-10-CM | POA: Diagnosis not present

## 2019-11-30 DIAGNOSIS — M7061 Trochanteric bursitis, right hip: Secondary | ICD-10-CM | POA: Diagnosis not present

## 2019-12-04 ENCOUNTER — Other Ambulatory Visit: Payer: Self-pay

## 2019-12-04 ENCOUNTER — Ambulatory Visit (INDEPENDENT_AMBULATORY_CARE_PROVIDER_SITE_OTHER): Payer: Medicare PPO

## 2019-12-04 ENCOUNTER — Encounter: Payer: Self-pay | Admitting: Emergency Medicine

## 2019-12-04 ENCOUNTER — Ambulatory Visit
Admission: EM | Admit: 2019-12-04 | Discharge: 2019-12-04 | Disposition: A | Discharge status: Home / Self Care | Payer: Medicare PPO | Attending: Emergency Medicine | Admitting: Emergency Medicine

## 2019-12-04 ENCOUNTER — Ambulatory Visit
Admission: EM | Admit: 2019-12-04 | Discharge: 2019-12-04 | Disposition: A | Discharge status: Home / Self Care | Payer: Medicare PPO | Source: Home / Self Care

## 2019-12-04 DIAGNOSIS — S6991XA Unspecified injury of right wrist, hand and finger(s), initial encounter: Secondary | ICD-10-CM | POA: Diagnosis not present

## 2019-12-04 DIAGNOSIS — W19XXXA Unspecified fall, initial encounter: Secondary | ICD-10-CM

## 2019-12-04 DIAGNOSIS — S52532A Colles' fracture of left radius, initial encounter for closed fracture: Secondary | ICD-10-CM | POA: Diagnosis not present

## 2019-12-04 DIAGNOSIS — M25532 Pain in left wrist: Secondary | ICD-10-CM

## 2019-12-04 NOTE — ED Triage Notes (Addendum)
Walking on uneven ground, tripped and fell.  Bilateral wrist pain.   Patient seen by Weber Cooks, PA prior to this nurse

## 2019-12-04 NOTE — ED Provider Notes (Signed)
[facility]    CSN: [id number] Arrival date & time: 12/04/19  1531      History   Chief Complaint Chief Complaint  Patient presents with  . Wrist Pain    HPI MICKEY HEBEL is a 86 y.o. female with extensive medical history as outlined below including hypertension, hypothyroidism, intracranial hemorrhage presenting with her daughter for evaluation of bilateral wrist pain s/p fall.  States this occurred shortly PTA.  Did hit right forehead: No LOC.  Patient is on Eliquis.  Daughter corroborates history: States EMS came out on site and patient had normal neuro exam.  No change in behavior, change in vision, hearing, dizziness, lightheadedness, nausea, vomiting.  No chest pain, difficulty breathing.  Patient denies distal upper extremity numbness or deformity.  Patient does endorse pain with movement.  Does have history of osteoporosis: Concern for broken bone.  Other areas of concern.  Past Medical History:  Diagnosis Date  . CAD (coronary artery disease)    a. LHC 10/2017 30% RCA otherwise OK.  . Chronic diastolic CHF (congestive heart failure) ([place])   . Hemorrhoids    current problem as of 04/26/16  with slight bleeding of hemorrhoids   . Hepatitis    hx of medication induced hepatitis (Vytorin per pt)  . History of kidney stones   . HLD (hyperlipidemia)   . HTN (hypertension)   . Hx of echocardiogram    echo 1/13: EF 55%, Gr 2 diast dysfn, Asc Ao aneurysm 4.8 cm, mild MR, mod LAE, mild RAE  . Hypothyroidism   . ICH (intracerebral hemorrhage) ([place]) 03/24/15  . Lower extremity edema   . NASH (nonalcoholic steatohepatitis)   . Neuromuscular disorder ([place])   . Pacemaker-Medtronic 11/12/2011   Implanted 2013   . Permanent atrial fibrillation ([place])   . PMR (polymyalgia rheumatica) (HCC)    no steriods for 5 years  . sinus node dysfunction//post termination pauses   . Stroke ([place])   . Thoracic aortic aneurysm (TAA) ([place])    a. followed by Dr. Servando Snare.  . Tricuspid  regurgitation   . Urinary tract infection    Recurrent infections.     Patient Active Problem List   Diagnosis Date Noted  . Migraine equivalent 01/06/2017  . Sepsis secondary to UTI ([place])   . Calculus of kidney   . Chronic diastolic CHF (congestive heart failure) ([place])   . AAA (abdominal aortic aneurysm) without rupture ([place])   . PMR (polymyalgia rheumatica) (HCC)   . Hyperthyroidism   . Calculus of ureter   . CAD in native artery   . Other specified hypothyroidism   . Sepsis ([place]) 04/22/2016  . Elevated LFTs 04/22/2016  . Hydronephrosis 04/22/2016  . Renal lithiasis 04/22/2016  . UTI (urinary tract infection) 04/22/2016  . Acute kidney injury ([place]) 04/22/2016  . Atrial flutter ([place])   . Nontraumatic cortical hemorrhage of left cerebral hemisphere ([place]) 05/03/2015  . Chronic anticoagulation 05/03/2015  . Cardiac pacemaker in situ 05/03/2015  . ICH (intracerebral hemorrhage) ([place]) 03/25/2015  . Encounter for therapeutic drug monitoring 10/04/2013  . Dyspnea on exertion 07/26/2013  . Chest pain 02/04/2013  . Orthostatic lightheadedness 06/25/2012  . Pacemaker-Medtronic 11/12/2011  . Ascending aorta enlargement ([place]) 08/12/2011  . Chronic diastolic heart failure ([place]) 12/27/2010  . Long term (current) use of anticoagulants 10/15/2010  . PREMATURE VENTRICULAR CONTRACTIONS 10/17/2009  . post termination pauses/.sinus bradycardia 11/17/2008  . Essential hypertension 04/28/2008  . Atrial fibrillation ([place]) 04/28/2008    Past Surgical History:  Procedure Laterality Date  . CARDIAC CATHETERIZATION    . CARDIOVERSION  03/02/2012   Procedure: CARDIOVERSION;  Surgeon: Lelon Perla, MD;  Location: [facility] ENDOSCOPY;  Service: Cardiovascular;  Laterality: N/A;  . CARDIOVERSION  03/13/2012   Procedure: CARDIOVERSION;  Surgeon: Josue Hector, MD;  Location: [place];  Service: Cardiovascular;  Laterality: N/A;  . CARDIOVERSION  04/15/2012   Procedure: CARDIOVERSION;  Surgeon:  [name], MD;  Location: [place];  Service: Cardiovascular;  Laterality: N/A;  . CARDIOVERSION N/A 06/07/2014   Procedure: CARDIOVERSION;  Surgeon: [name], MD;  Location: [facility] ENDOSCOPY;  Service: Cardiovascular;  Laterality: N/A;  . CARDIOVERSION N/A 08/18/2014   Procedure: CARDIOVERSION;  Surgeon: [name], MD;  Location: [place];  Service: Cardiovascular;  Laterality: N/A;  . CARDIOVERSION N/A 09/06/2015   Procedure: CARDIOVERSION;  Surgeon: Jerline Pain, MD;  Location: [place];  Service: Cardiovascular;  Laterality: N/A;  . CYSTOSCOPY WITH STENT PLACEMENT Right 04/22/2016   Procedure: CYSTOSCOPY WITH RIGHT URETERAL STENT INSERTION;  Surgeon: [name], MD;  Location: [place];  Service: Urology;  Laterality: Right;  . CYSTOSCOPY WITH URETEROSCOPY, STONE BASKETRY AND STENT PLACEMENT Right 04/30/2016   Procedure: CYSTOSCOPY WITH URETEROSCOPY, STENT PLACEMENT REMOVAL;  Surgeon: [name], MD;  Location: [facility];  Service: Urology;  Laterality: Right;  . HERNIA REPAIR    . INCISION AND DRAINAGE BREAST ABSCESS  1973  . LEFT HEART CATH AND CORONARY ANGIOGRAPHY N/A 10/28/2017   Procedure: LEFT HEART CATH AND CORONARY ANGIOGRAPHY;  Surgeon: Lorretta Harp, MD;  Location: [place] [facility];  Service: Cardiovascular;  Laterality: N/A;  . PACEMAKER INSERTION  Jan 2013  . PERMANENT PACEMAKER INSERTION N/A 07/29/2011   Procedure: PERMANENT PACEMAKER INSERTION;  Surgeon: [name], MD;  Location: [facility] CATH LAB;  Service: Cardiovascular;  Laterality: N/A;  . UMBILICAL HERNIA REPAIR  1950's    OB History   No obstetric history on file.      Home Medications    Prior to Admission medications   Medication Sig Start Date End Date Taking? Authorizing Provider  Ascorbic Acid (VITAMIN C) 500 MG CAPS Take by mouth daily.    [provider]  Calcium Carbonate-Vitamin D (CALTRATE 600+D PO) Take 1 tablet by mouth daily.     [provider]    cetirizine (ZYRTEC) 10 MG tablet Take 10 mg by mouth daily as needed for allergies.     [provider]  Cholecalciferol (VITAMIN D3) 1000 UNITS tablet Take 1,000 Units by mouth daily.     [provider]  diltiazem (CARDIZEM CD) 120 MG 24 hr capsule Take 1 capsule (120 mg total) by mouth 2 (two) times daily. 02/10/19   [name], MD  ELIQUIS 5 MG TABS tablet TAKE 1 TABLET BY MOUTH TWICE DAILY 06/22/19   [name], MD  furosemide (LASIX) 40 MG tablet Take 1.5 tablets (60 mg total) by mouth daily. 08/31/19 11/29/19  Shirley Friar, PA-C  labetalol (NORMODYNE) 300 MG tablet TAKE 1 TABLET BY MOUTH IN THE MORNING AND 1 & 1/2 TABLET IN THE EVENING. Please make yearly appt with Dr. Caryl Comes for May before anymore refills. 1st attempt 08/23/19   [name], MD  levothyroxine (SYNTHROID, LEVOTHROID) 25 MCG tablet Take 25 mcg by mouth daily before breakfast.    [provider]  losartan (COZAAR) 25 MG tablet Take 1 tablet (25 mg total) by mouth daily. 09/14/19 12/13/19  Shirley Friar, PA-C  spironolactone (ALDACTONE) 25 MG tablet Take 1 tablet (25 mg total) by mouth daily. 11/12/19   Shirley Friar, PA-C  WELCHOL 625 MG tablet Take 625 mg by mouth every morning and 1250 mg by mouth at bedtime. 05/08/12   [provider]    Family History Family History  Problem Relation Age of Onset  . Coronary artery disease Mother   . Coronary artery disease Other   . Alzheimer's disease Other   . Coronary artery disease Brother   . Coronary artery disease Brother     Social History Social History   Tobacco Use  . Smoking status: Former Smoker    Types: Cigarettes    Quit date: 07/16/1990    Years since quitting: 29.4  . Smokeless tobacco: Never Used  Substance Use Topics  . Alcohol use: No  . Drug use: No     Allergies   Penicillins, Statins, Tylenol [acetaminophen], Amiodarone, Quinolones, Tape, and Codeine   Review of Systems As  per HPI   Physical Exam Triage Vital Signs ED Triage Vitals  Enc Vitals Group     BP      Pulse      Resp      Temp      Temp src      SpO2      Weight      Height      Head Circumference      Peak Flow      Pain Score      Pain Loc      Pain Edu?      Excl. in [place]?    No data found.  Updated Vital Signs BP (!) 156/75 (BP Location: Left Arm)   Pulse 80   Temp 98 F (36.7 C) (Oral)   Resp (!) 22   SpO2 97%   Visual Acuity Right Eye Distance:   Left Eye Distance:   Bilateral Distance:    Right Eye Near:   Left Eye Near:    Bilateral Near:     Physical Exam Constitutional:      General: She is not in acute distress. HENT:     Head: Normocephalic and atraumatic.  Eyes:     General: No scleral icterus.    Pupils: Pupils are equal, round, and reactive to light.  Cardiovascular:     Rate and Rhythm: Normal rate.  Pulmonary:     Effort: Pulmonary effort is normal.  Musculoskeletal:        General: Swelling and tenderness present.     Comments: Bilateral wrist pain and swelling over bony prominences.  No MCP tenderness, deformity, elbow pain.  Neurovascular intact.  Skin:    Coloration: Skin is not jaundiced or pale.  Neurological:     Mental Status: She is alert and oriented to person, place, and time.      UC Treatments / Results  Labs (all labs ordered are listed, but only abnormal results are displayed) Labs Reviewed - No data to display  EKG   Radiology DG Wrist Complete Left  Result Date: 12/04/2019 CLINICAL DATA:  Left wrist pain status post fall. EXAM: LEFT WRIST - COMPLETE 3+ VIEW COMPARISON:  July 16, 2012 FINDINGS: A fracture deformity is seen involving the distal left radius. This represents a new finding when compared to the prior study and extends to the radiocarpal articulation. Mild diffusely sclerotic osseous structures are seen. There is no evidence of dislocation. There is mild diffuse soft tissue swelling. IMPRESSION: Nondisplaced  intra-articular fracture of the distal left radius. Electronically Signed   By: Virgina Norfolk M.D.   On: 12/04/2019 16:09   DG Wrist Complete Right  Result Date: 12/04/2019 CLINICAL DATA:  Status post fall. EXAM: RIGHT WRIST - COMPLETE 3+ VIEW COMPARISON:  None. FINDINGS: There is no evidence of an acute fracture or dislocation. Mild chronic changes seen along the dorsal aspect of the right wrist. Mild, diffusely sclerotic osseous structures are noted. Mild degenerative changes seen along the carpometacarpal articulation of the right thumb. Soft tissues are unremarkable. IMPRESSION: Chronic and degenerative changes without evidence of acute osseous abnormality. Electronically Signed   By: Virgina Norfolk M.D.   On: 12/04/2019 16:10    Procedures Procedures (including critical care time)  Medications Ordered in UC Medications - No data to display  Initial Impression / Assessment and Plan / UC Course  I have reviewed the triage vital signs and the nursing notes.  Pertinent labs & imaging results that were available during my care of the patient were reviewed by me and considered in my medical decision making (see chart for details).     Afebrile, nontoxic, hemodynamically stable in office.  No LOC.  Patient is on Eliquis: Reviewed strict return precautions given history of ICH.  Left and right wrist x-ray done office, reviewed by me radiology: Significant for nondisplaced intra-articular fracture of distal left radius.  Reviewed findings with patient and daughter who verbalized understanding.  Sam splint (sugar tong) and sling applied in office which patient tolerated well.  Neurovascularly intact before and after splinting.  Discussed Sam splint is temporary: Patient needs Ortho follow-up on Monday-provided [facility] contact information (patient is already a patient there) and informed them to request same-day appointment or walk-in during the urgent care hours (8A-8P).  Return precautions  discussed, patient and daughter verbalized understanding and is agreeable to plan. Final Clinical Impressions(s) / UC Diagnoses   Final diagnoses:  Fall, initial encounter  Closed Colles' fracture of left radius, initial encounter     Discharge Instructions     Recommend RICE: rest, ice, compression, elevation as needed for pain.    Heat therapy (hot compress, warm wash rag, hot showers, etc.) can help relax muscles and soothe muscle aches. Cold therapy (ice packs) can be used to help swelling both after injury and after prolonged use of areas of chronic pain/aches.  For pain: recommend 350 mg-1000 mg of Tylenol (acetaminophen) and/or 200 mg - 800 mg of Advil (ibuprofen, Motrin) every 8 hours as needed.  May alternate between the two throughout the day as they are generally safe to take together.  DO NOT exceed more than 3000 mg of Tylenol or 3200 mg of ibuprofen in a 24 hour period as this could damage your stomach, kidneys, liver, or increase your bleeding risk.    ED Prescriptions    None     PDMP not reviewed this encounter.   Neldon Mc Hunter, [place] 12/05/19 [phone]

## 2019-12-04 NOTE — Discharge Instructions (Signed)
Recommend RICE: rest, ice, compression, elevation as needed for pain.    Heat therapy (hot compress, warm wash rag, hot showers, etc.) can help relax muscles and soothe muscle aches. Cold therapy (ice packs) can be used to help swelling both after injury and after prolonged use of areas of chronic pain/aches.  For pain: recommend 350 mg-1000 mg of Tylenol (acetaminophen) and/or 200 mg - 800 mg of Advil (ibuprofen, Motrin) every 8 hours as needed.  May alternate between the two throughout the day as they are generally safe to take together.  DO NOT exceed more than 3000 mg of Tylenol or 3200 mg of ibuprofen in a 24 hour period as this could damage your stomach, kidneys, liver, or increase your bleeding risk.

## 2019-12-05 ENCOUNTER — Encounter: Payer: Self-pay | Admitting: Emergency Medicine

## 2019-12-06 DIAGNOSIS — M25532 Pain in left wrist: Secondary | ICD-10-CM | POA: Diagnosis not present

## 2019-12-06 DIAGNOSIS — M25531 Pain in right wrist: Secondary | ICD-10-CM | POA: Diagnosis not present

## 2019-12-06 DIAGNOSIS — S52501A Unspecified fracture of the lower end of right radius, initial encounter for closed fracture: Secondary | ICD-10-CM | POA: Diagnosis not present

## 2019-12-06 DIAGNOSIS — S52502D Unspecified fracture of the lower end of left radius, subsequent encounter for closed fracture with routine healing: Secondary | ICD-10-CM | POA: Diagnosis not present

## 2019-12-14 DIAGNOSIS — D500 Iron deficiency anemia secondary to blood loss (chronic): Secondary | ICD-10-CM

## 2019-12-14 HISTORY — DX: Iron deficiency anemia secondary to blood loss (chronic): D50.0

## 2019-12-16 ENCOUNTER — Emergency Department (HOSPITAL_COMMUNITY): Payer: Medicare PPO

## 2019-12-16 ENCOUNTER — Emergency Department (HOSPITAL_COMMUNITY)
Admission: EM | Admit: 2019-12-16 | Discharge: 2019-12-16 | Disposition: A | Discharge status: Home / Self Care | Payer: Medicare PPO | Attending: Emergency Medicine | Admitting: Emergency Medicine

## 2019-12-16 ENCOUNTER — Encounter (HOSPITAL_COMMUNITY): Payer: Self-pay | Admitting: Emergency Medicine

## 2019-12-16 DIAGNOSIS — Z79899 Other long term (current) drug therapy: Secondary | ICD-10-CM | POA: Insufficient documentation

## 2019-12-16 DIAGNOSIS — M25512 Pain in left shoulder: Secondary | ICD-10-CM | POA: Diagnosis not present

## 2019-12-16 DIAGNOSIS — S80212A Abrasion, left knee, initial encounter: Secondary | ICD-10-CM | POA: Diagnosis not present

## 2019-12-16 DIAGNOSIS — M25519 Pain in unspecified shoulder: Secondary | ICD-10-CM | POA: Diagnosis not present

## 2019-12-16 DIAGNOSIS — R0781 Pleurodynia: Secondary | ICD-10-CM | POA: Diagnosis not present

## 2019-12-16 DIAGNOSIS — S8991XA Unspecified injury of right lower leg, initial encounter: Secondary | ICD-10-CM | POA: Diagnosis present

## 2019-12-16 DIAGNOSIS — S80211A Abrasion, right knee, initial encounter: Secondary | ICD-10-CM | POA: Insufficient documentation

## 2019-12-16 DIAGNOSIS — R9431 Abnormal electrocardiogram [ECG] [EKG]: Secondary | ICD-10-CM | POA: Diagnosis not present

## 2019-12-16 DIAGNOSIS — Y9301 Activity, walking, marching and hiking: Secondary | ICD-10-CM | POA: Insufficient documentation

## 2019-12-16 DIAGNOSIS — S40012A Contusion of left shoulder, initial encounter: Secondary | ICD-10-CM | POA: Insufficient documentation

## 2019-12-16 DIAGNOSIS — Y929 Unspecified place or not applicable: Secondary | ICD-10-CM | POA: Insufficient documentation

## 2019-12-16 DIAGNOSIS — S0990XA Unspecified injury of head, initial encounter: Secondary | ICD-10-CM | POA: Diagnosis not present

## 2019-12-16 DIAGNOSIS — S4992XA Unspecified injury of left shoulder and upper arm, initial encounter: Secondary | ICD-10-CM | POA: Diagnosis not present

## 2019-12-16 DIAGNOSIS — R519 Headache, unspecified: Secondary | ICD-10-CM | POA: Diagnosis not present

## 2019-12-16 DIAGNOSIS — W010XXA Fall on same level from slipping, tripping and stumbling without subsequent striking against object, initial encounter: Secondary | ICD-10-CM | POA: Diagnosis not present

## 2019-12-16 DIAGNOSIS — Y998 Other external cause status: Secondary | ICD-10-CM | POA: Insufficient documentation

## 2019-12-16 DIAGNOSIS — R52 Pain, unspecified: Secondary | ICD-10-CM | POA: Diagnosis not present

## 2019-12-16 DIAGNOSIS — M25552 Pain in left hip: Secondary | ICD-10-CM | POA: Diagnosis not present

## 2019-12-16 DIAGNOSIS — I4891 Unspecified atrial fibrillation: Secondary | ICD-10-CM | POA: Diagnosis not present

## 2019-12-16 MED ORDER — BACITRACIN ZINC 500 UNIT/GM EX OINT
TOPICAL_OINTMENT | Freq: Once | CUTANEOUS | Status: DC
  Filled 2019-12-16: qty 0.9

## 2019-12-16 MED ORDER — ACETAMINOPHEN 500 MG PO TABS
1000.0000 mg | ORAL_TABLET | Freq: Once | ORAL | Status: AC
  Administered 2019-12-16: 1000 mg via ORAL
  Filled 2019-12-16: qty 2

## 2019-12-16 NOTE — ED Provider Notes (Signed)
I received this patient in signout from Dr. Ashok Cordia. Briefly, she had presented w/ mechanical fall, bumped head, on anticoagulation for A fib. At time of signout, awaiting head CT to r/o injury.   Head CT negative acute. Pt alert and comfortable on reassessment. This is her second fall in a month, she does admit to some balance issues. I recommended PT/OT eval and placed orders for this. Discussed return precautions regarding her head injury and she and husband voiced understanding.   Fayne Mcguffee, Wenda Over[place], MD 12/16/19 [phone]

## 2019-12-16 NOTE — ED Provider Notes (Signed)
[facility] EMERGENCY DEPARTMENT Provider Note   CSN: [id number] Arrival date & time: 12/16/19  1206     History No chief complaint on file.   [name] [name] is a 86 y.o. female.  Patient with hx afib, on anticoag therapy, presents s/p fall. Was walking w spouse, tripped, fell forward, hit knees, unclear if bumped head. Pt with skin tears to bil knees, contusion to left shoulder. Pain to left shoulder, moderate, dull, non radiating, worse w palpation. Denies neck or back pain. No headache. Notes did have other recent fall. Denies faintness, lightheadedness, or syncope.  States felt normal, at baseline, prior to fall. Tetanus up to date ~ 4 yrs ago. Denies other pain or injury.   The history is provided by the patient.       History reviewed. No pertinent past medical history.  There are no problems to display for this patient.   History reviewed. No pertinent surgical history.   OB History   No obstetric history on file.     History reviewed. No pertinent family history.  Social History   Tobacco Use  . Smoking status: Not on file  Substance Use Topics  . Alcohol use: Not on file  . Drug use: Not on file    Home Medications Prior to Admission medications   Medication Sig Start Date End Date Taking? Authorizing Provider  acetaminophen (TYLENOL) 500 MG tablet Take 1,000 mg by mouth every 6 (six) hours as needed for mild pain.   Yes [provider]  Ascorbic Acid (VITAMIN C) 1000 MG tablet Take 500 mg by mouth daily.   Yes [provider]  calcium carbonate (OSCAL) 1500 (600 Ca) MG TABS tablet Take 1,500 mg by mouth daily with breakfast.   Yes [provider]  cholecalciferol (VITAMIN D3) 25 MCG (1000 UNIT) tablet Take 1,000 Units by mouth daily.   Yes [provider]  colesevelam (WELCHOL) 625 MG tablet Take 625-1,250 mg by mouth See admin instructions. Taking 1 tablet in the AM and 2 tabs (1290m) in the evening  11/08/19  Yes [provider]  diltiazem (CARDIZEM CD) 120 MG 24 hr capsule Take 120 mg by mouth 2 (two) times daily. 11/08/19  Yes [provider]  ELIQUIS 5 MG TABS tablet Take 5 mg by mouth 2 (two) times daily. 11/22/19  Yes [provider]  furosemide (LASIX) 40 MG tablet Take 60 mg by mouth daily. 1 & 1/2 tabs= mg 08/31/19  Yes [provider]  labetalol (NORMODYNE) 300 MG tablet Take 300-450 mg by mouth See admin instructions. Taking 1 tablet in the Am (3047m  and 1 & 1/2 tablets (450) mg at night 11/01/19  Yes [provider]  levothyroxine (SYNTHROID) 25 MCG tablet Take 25 mcg by mouth daily. 11/16/19  Yes [provider]  losartan (COZAAR) 25 MG tablet Take 25 mg by mouth at bedtime. 12/14/19  Yes [provider]  Multiple Vitamins-Minerals (EYE VITAMINS PO) Take 1 tablet by mouth daily.   Yes [provider]  spironolactone (ALDACTONE) 25 MG tablet Take 25 mg by mouth daily. 11/12/19  Yes [provider]    Allergies    Adhesive [tape], Amiodarone, Codeine, Penicillins, Quinolones, and Statins  Review of Systems   Review of Systems  Constitutional: Negative for chills and fever.  HENT: Negative for nosebleeds.   Eyes: Negative for redness and visual disturbance.  Respiratory: Negative for shortness of breath.   Cardiovascular: Negative for chest pain.  Gastrointestinal: Negative for abdominal pain, blood in stool and vomiting.  Genitourinary: Negative for flank pain.  Musculoskeletal: Negative for back pain and neck pain.  Skin: Positive for wound. Negative for rash.  Neurological: Negative for weakness, numbness and headaches.  Hematological: Does not bruise/bleed easily.       +anticoag therapy  Psychiatric/Behavioral: Negative for confusion.    Physical Exam Updated Vital Signs BP (!) 123/97   Pulse 76   Temp 97.7 F (36.5 C)   Resp (!) 21   Ht 1.6 m (5' 3" )   Wt 70.3 kg   SpO2 100%   BMI  27.46 kg/m   Physical Exam Vitals and nursing note reviewed.  Constitutional:      Appearance: Normal appearance. She is well-developed.  HENT:     Head: Atraumatic.     Nose: Nose normal.     Mouth/Throat:     Mouth: Mucous membranes are moist.  Eyes:     General: No scleral icterus.    Conjunctiva/sclera: Conjunctivae normal.     Pupils: Pupils are equal, round, and reactive to light.  Neck:     Vascular: No carotid bruit.     Trachea: No tracheal deviation.  Cardiovascular:     Rate and Rhythm: Normal rate and regular rhythm.     Pulses: Normal pulses.     Heart sounds: Normal heart sounds. No murmur. No friction rub. No gallop.   Pulmonary:     Effort: Pulmonary effort is normal. No respiratory distress.     Breath sounds: Normal breath sounds.  Abdominal:     General: Bowel sounds are normal. There is no distension.     Palpations: Abdomen is soft. There is no mass.     Tenderness: There is no abdominal tenderness. There is no guarding.  Genitourinary:    Comments: No cva tenderness.  Musculoskeletal:     Cervical back: Normal range of motion and neck supple. No rigidity. No muscular tenderness.     Comments: Tenderness left shoulder posteriorly, no deformity.  Skin tears bil knees, small/superficial. Good rom bil extremities, no other pain or focal bony tenderness noted. Distal pulses palp bil.  CTLS spine, non tender, aligned, no step off.   Skin:    General: Skin is warm and dry.     Findings: No rash.  Neurological:     Mental Status: She is alert.     Comments: Alert, speech normal. GCS 15. Motor/Sens grossly intact bil.   Psychiatric:        Mood and Affect: Mood normal.     ED Results / Procedures / Treatments   Labs (all labs ordered are listed, but only abnormal results are displayed) Labs Reviewed - No data to display  EKG EKG Interpretation  Date/Time:  Thursday December 16 2019 12:23:08 EDT Ventricular Rate:  80 PR Interval:    QRS  Duration: 110 QT Interval:  400 QTC Calculation: 462 R Axis:   -22 Text Interpretation: Atrial fibrillation LVH with secondary repolarization abnormality Premature ventricular complexes Baseline wander Confirmed by Lajean Saver [phone]) on 12/16/2019 3:24:29 PM   Radiology DG Shoulder Left  Result Date: 12/16/2019 CLINICAL DATA:  Posterior left shoulder pain after fall EXAM: LEFT SHOULDER - 2+ VIEW COMPARISON:  None. FINDINGS: Osseous structures appear demineralized. There is no evidence of fracture or dislocation. Mild arthropathy of the [facility] Heart joint. Glenohumeral joint appears within normal limits. Soft tissues are unremarkable. Left-sided implanted cardiac device is noted. Atherosclerotic calcification of the aortic knob.  IMPRESSION: Negative. Electronically Signed   By: Davina Poke D.O.   On: 12/16/2019 15:26    Procedures Procedures (including critical care time)  Medications Ordered in ED Medications  bacitracin ointment (has no administration in time range)  acetaminophen (TYLENOL) tablet 1,000 mg (1,000 mg Oral Given 12/16/19 1411)    ED Course  I have reviewed the triage vital signs and the nursing notes.  Pertinent labs & imaging results that were available during my care of the patient were reviewed by me and considered in my medical decision making (see chart for details).    MDM Rules/Calculators/A&P                     Imaging studies ordered.   Reviewed nursing notes and prior charts for additional history.   Wounds cleaned, bacitracin and sterile dressing. icepack to sore area.   Acetaminophen po.  xrays reviewed/interpreted by me - no fx.  CT pending.  1650, CT pending - signed out to Dr Rex Kras, check CT, staff to ambulate in hall, and dispo appropriately then.    Final Clinical Impression(s) / ED Diagnoses Final diagnoses:  None    Rx / DC Orders ED Discharge Orders    None       Lajean Saver, MD 12/16/19 1552

## 2019-12-16 NOTE — ED Notes (Signed)
Wounds cleansed previously with wound wash and saline.  Attempted use with steri strips, but with bend of knee the dressings removed.  Non-adherant dressing and micropore tape to wounds.  Instructions to husband for additional dressing changes.

## 2019-12-16 NOTE — ED Notes (Signed)
Pt alert and oriented, denies any pain, dizziness, lightheadedness or other sx prior to fall.   Was walking on flat ground and fell without trip, fall etc noted.  Husband at bedside.   Wounds cleansed with saline and wound wash.

## 2019-12-16 NOTE — Discharge Instructions (Addendum)
It was our pleasure to provide your ER care today - we hope that you feel better.  Icepack to sore area. Take acetaminophen as need.  Keep abrasions/wounds very clean.   Given recent falls - contact your doctor tomorrow to discuss whether or not they want to continue your blood thinner medication.   Return to ER if worse, new symptoms, new or severe pain, weak/faint, infection of wounds, or other concern.

## 2019-12-17 ENCOUNTER — Encounter: Payer: Self-pay | Admitting: Emergency Medicine

## 2019-12-22 DIAGNOSIS — S52501D Unspecified fracture of the lower end of right radius, subsequent encounter for closed fracture with routine healing: Secondary | ICD-10-CM | POA: Diagnosis not present

## 2019-12-22 DIAGNOSIS — M25532 Pain in left wrist: Secondary | ICD-10-CM | POA: Diagnosis not present

## 2019-12-22 DIAGNOSIS — M25552 Pain in left hip: Secondary | ICD-10-CM | POA: Diagnosis not present

## 2019-12-22 DIAGNOSIS — S52502D Unspecified fracture of the lower end of left radius, subsequent encounter for closed fracture with routine healing: Secondary | ICD-10-CM | POA: Diagnosis not present

## 2019-12-23 DIAGNOSIS — W19XXXA Unspecified fall, initial encounter: Secondary | ICD-10-CM | POA: Diagnosis not present

## 2019-12-23 DIAGNOSIS — Z9989 Dependence on other enabling machines and devices: Secondary | ICD-10-CM | POA: Diagnosis not present

## 2019-12-23 DIAGNOSIS — R2681 Unsteadiness on feet: Secondary | ICD-10-CM | POA: Diagnosis not present

## 2019-12-23 DIAGNOSIS — Z9181 History of falling: Secondary | ICD-10-CM | POA: Diagnosis not present

## 2019-12-27 ENCOUNTER — Other Ambulatory Visit: Payer: Self-pay | Admitting: Internal Medicine

## 2019-12-27 NOTE — Telephone Encounter (Signed)
Prescription refill request for Eliquis received.  Last office visit: 11/12/2019 Scr: 0.92, 11/26/2019 Age: 86 y.o. Weight: 70.3 kg   Prescription refill sent.

## 2019-12-29 DIAGNOSIS — K7581 Nonalcoholic steatohepatitis (NASH): Secondary | ICD-10-CM | POA: Diagnosis not present

## 2019-12-29 DIAGNOSIS — S62102D Fracture of unspecified carpal bone, left wrist, subsequent encounter for fracture with routine healing: Secondary | ICD-10-CM | POA: Diagnosis not present

## 2019-12-29 DIAGNOSIS — I051 Rheumatic mitral insufficiency: Secondary | ICD-10-CM | POA: Diagnosis not present

## 2019-12-29 DIAGNOSIS — I2510 Atherosclerotic heart disease of native coronary artery without angina pectoris: Secondary | ICD-10-CM | POA: Diagnosis not present

## 2019-12-29 DIAGNOSIS — I480 Paroxysmal atrial fibrillation: Secondary | ICD-10-CM | POA: Diagnosis not present

## 2019-12-29 DIAGNOSIS — I110 Hypertensive heart disease with heart failure: Secondary | ICD-10-CM | POA: Diagnosis not present

## 2019-12-29 DIAGNOSIS — I5030 Unspecified diastolic (congestive) heart failure: Secondary | ICD-10-CM | POA: Diagnosis not present

## 2019-12-29 DIAGNOSIS — S62101D Fracture of unspecified carpal bone, right wrist, subsequent encounter for fracture with routine healing: Secondary | ICD-10-CM | POA: Diagnosis not present

## 2019-12-29 DIAGNOSIS — I714 Abdominal aortic aneurysm, without rupture: Secondary | ICD-10-CM | POA: Diagnosis not present

## 2020-01-03 DIAGNOSIS — I480 Paroxysmal atrial fibrillation: Secondary | ICD-10-CM | POA: Diagnosis not present

## 2020-01-03 DIAGNOSIS — K7581 Nonalcoholic steatohepatitis (NASH): Secondary | ICD-10-CM | POA: Diagnosis not present

## 2020-01-03 DIAGNOSIS — I110 Hypertensive heart disease with heart failure: Secondary | ICD-10-CM | POA: Diagnosis not present

## 2020-01-03 DIAGNOSIS — I714 Abdominal aortic aneurysm, without rupture: Secondary | ICD-10-CM | POA: Diagnosis not present

## 2020-01-03 DIAGNOSIS — S62101D Fracture of unspecified carpal bone, right wrist, subsequent encounter for fracture with routine healing: Secondary | ICD-10-CM | POA: Diagnosis not present

## 2020-01-03 DIAGNOSIS — I051 Rheumatic mitral insufficiency: Secondary | ICD-10-CM | POA: Diagnosis not present

## 2020-01-03 DIAGNOSIS — I5030 Unspecified diastolic (congestive) heart failure: Secondary | ICD-10-CM | POA: Diagnosis not present

## 2020-01-03 DIAGNOSIS — I2510 Atherosclerotic heart disease of native coronary artery without angina pectoris: Secondary | ICD-10-CM | POA: Diagnosis not present

## 2020-01-03 DIAGNOSIS — S62102D Fracture of unspecified carpal bone, left wrist, subsequent encounter for fracture with routine healing: Secondary | ICD-10-CM | POA: Diagnosis not present

## 2020-01-05 ENCOUNTER — Ambulatory Visit
Admission: EM | Admit: 2020-01-05 | Discharge: 2020-01-05 | Disposition: A | Discharge status: Home / Self Care | Payer: Medicare PPO | Attending: Emergency Medicine | Admitting: Emergency Medicine

## 2020-01-05 ENCOUNTER — Telehealth: Payer: Self-pay | Admitting: Internal Medicine

## 2020-01-05 DIAGNOSIS — K7581 Nonalcoholic steatohepatitis (NASH): Secondary | ICD-10-CM | POA: Diagnosis not present

## 2020-01-05 DIAGNOSIS — R600 Localized edema: Secondary | ICD-10-CM

## 2020-01-05 DIAGNOSIS — I5030 Unspecified diastolic (congestive) heart failure: Secondary | ICD-10-CM | POA: Diagnosis not present

## 2020-01-05 DIAGNOSIS — R234 Changes in skin texture: Secondary | ICD-10-CM

## 2020-01-05 DIAGNOSIS — I480 Paroxysmal atrial fibrillation: Secondary | ICD-10-CM | POA: Diagnosis not present

## 2020-01-05 DIAGNOSIS — I2510 Atherosclerotic heart disease of native coronary artery without angina pectoris: Secondary | ICD-10-CM | POA: Diagnosis not present

## 2020-01-05 DIAGNOSIS — I110 Hypertensive heart disease with heart failure: Secondary | ICD-10-CM | POA: Diagnosis not present

## 2020-01-05 DIAGNOSIS — I051 Rheumatic mitral insufficiency: Secondary | ICD-10-CM | POA: Diagnosis not present

## 2020-01-05 DIAGNOSIS — S62101D Fracture of unspecified carpal bone, right wrist, subsequent encounter for fracture with routine healing: Secondary | ICD-10-CM | POA: Diagnosis not present

## 2020-01-05 DIAGNOSIS — I714 Abdominal aortic aneurysm, without rupture: Secondary | ICD-10-CM | POA: Diagnosis not present

## 2020-01-05 DIAGNOSIS — S62102D Fracture of unspecified carpal bone, left wrist, subsequent encounter for fracture with routine healing: Secondary | ICD-10-CM | POA: Diagnosis not present

## 2020-01-05 DIAGNOSIS — M79605 Pain in left leg: Secondary | ICD-10-CM | POA: Diagnosis not present

## 2020-01-05 DIAGNOSIS — L03116 Cellulitis of left lower limb: Secondary | ICD-10-CM

## 2020-01-05 MED ORDER — DOXYCYCLINE HYCLATE 100 MG PO CAPS: 100.0000 mg | ORAL_CAPSULE | Freq: Two times a day (BID) | ORAL | 0 refills | Status: AC

## 2020-01-05 NOTE — ED Provider Notes (Signed)
[facility]    CSN: [id number] Arrival date & time: 01/05/20  1403      History   Chief Complaint Chief Complaint  Patient presents with  . Leg Swelling    HPI [name] [name] is a 86 y.o. female with extensive medical history as outlined below including TIA, stroke, hypertension, ICH, neuromuscular disorder, permanent A. fib (on Eliquis) presenting with left leg pain and erythema.  Patient has history of bilateral lower extremity edema, for which she takes 60 mg of Lasix daily: Did not take it this morning as she did not feel like going to the bathroom frequently.  Denies fever, chills, arthralgias, myalgias.  States she did have a open wound on her left lower leg, medial aspect that is since healed.  Patient states the other day she felt a popping sensation and developed significant erythema and tenderness thereafter.  No numbness.  No blood in stool or urine, abdominal pain, chest pain, difficulty breathing or palpitations.   Past Medical History:  Diagnosis Date  . CAD (coronary artery disease)    a. LHC 10/2017 30% RCA otherwise OK.  . Chronic diastolic CHF (congestive heart failure) ([place])   . Hemorrhoids    current problem as of 04/26/16  with slight bleeding of hemorrhoids   . Hepatitis    hx of medication induced hepatitis (Vytorin per pt)  . History of kidney stones   . HLD (hyperlipidemia)   . HTN (hypertension)   . Hx of echocardiogram    echo 1/13: EF 55%, Gr 2 diast dysfn, Asc Ao aneurysm 4.8 cm, mild MR, mod LAE, mild RAE  . Hypothyroidism   . ICH (intracerebral hemorrhage) ([place]) 03/24/15  . Lower extremity edema   . NASH (nonalcoholic steatohepatitis)   . Neuromuscular disorder ([place])   . Pacemaker-Medtronic 11/12/2011   Implanted 2013   . Permanent atrial fibrillation ([place])   . PMR (polymyalgia rheumatica) (H[facility])    no steriods for 5 years  . sinus node dysfunction//post termination pauses   . Stroke ([place])   . Thoracic aortic aneurysm (TAA) ([place])      a. followed by Dr. Servando Snare.  . Tricuspid regurgitation   . Urinary tract infection    Recurrent infections.     Patient Active Problem List   Diagnosis Date Noted  . Migraine equivalent 01/06/2017  . Sepsis secondary to UTI ([place])   . Calculus of kidney   . Chronic diastolic CHF (congestive heart failure) ([place])   . AAA (abdominal aortic aneurysm) without rupture ([place])   . PMR (polymyalgia rheumatica) (H[facility])   . Hyperthyroidism   . Calculus of ureter   . CAD in native artery   . Other specified hypothyroidism   . Sepsis ([place]) 04/22/2016  . Elevated LFTs 04/22/2016  . Hydronephrosis 04/22/2016  . Renal lithiasis 04/22/2016  . UTI (urinary tract infection) 04/22/2016  . Acute kidney injury ([place]) 04/22/2016  . Atrial flutter ([place])   . Nontraumatic cortical hemorrhage of left cerebral hemisphere ([place]) 05/03/2015  . Chronic anticoagulation 05/03/2015  . Cardiac pacemaker in situ 05/03/2015  . ICH (intracerebral hemorrhage) ([place]) 03/25/2015  . Encounter for therapeutic drug monitoring 10/04/2013  . Dyspnea on exertion 07/26/2013  . Chest pain 02/04/2013  . Orthostatic lightheadedness 06/25/2012  . Pacemaker-Medtronic 11/12/2011  . Ascending aorta enlargement ([facility]) 08/12/2011  . Chronic diastolic heart failure ([place]) 12/27/2010  . Long term (current) use of anticoagulants 10/15/2010  . PREMATURE VENTRICULAR CONTRACTIONS 10/17/2009  . post termination pauses/.sinus bradycardia 11/17/2008  .  Essential hypertension 04/28/2008  . Atrial fibrillation ([place]) 04/28/2008    Past Surgical History:  Procedure Laterality Date  . CARDIAC CATHETERIZATION    . CARDIOVERSION  03/02/2012   Procedure: CARDIOVERSION;  Surgeon: Lelon Perla, MD;  Location: [facility]COPY;  Service: Cardiovascular;  Laterality: N/A;  . CARDIOVERSION  03/13/2012   Procedure: CARDIOVERSION;  Surgeon: Josue Hector, MD;  Location: [place];  Service: Cardiovascular;  Laterality: N/A;  . CARDIOVERSION   04/15/2012   Procedure: CARDIOVERSION;  Surgeon: [name], MD;  Location: [place];  Service: Cardiovascular;  Laterality: N/A;  . CARDIOVERSION N/A 06/07/2014   Procedure: CARDIOVERSION;  Surgeon: [name], MD;  Location: [facility] ENDOSCOPY;  Service: Cardiovascular;  Laterality: N/A;  . CARDIOVERSION N/A 08/18/2014   Procedure: CARDIOVERSION;  Surgeon: [name], MD;  Location: [place];  Service: Cardiovascular;  Laterality: N/A;  . CARDIOVERSION N/A 09/06/2015   Procedure: CARDIOVERSION;  Surgeon: Jerline Pain, MD;  Location: [place];  Service: Cardiovascular;  Laterality: N/A;  . CYSTOSCOPY WITH STENT PLACEMENT Right 04/22/2016   Procedure: CYSTOSCOPY WITH RIGHT URETERAL STENT INSERTION;  Surgeon: [name], MD;  Location: [place];  Service: Urology;  Laterality: Right;  . CYSTOSCOPY WITH URETEROSCOPY, STONE BASKETRY AND STENT PLACEMENT Right 04/30/2016   Procedure: CYSTOSCOPY WITH URETEROSCOPY, STENT PLACEMENT REMOVAL;  Surgeon: [name], MD;  Location: [facility];  Service: Urology;  Laterality: Right;  . HERNIA REPAIR    . INCISION AND DRAINAGE BREAST ABSCESS  1973  . LEFT HEART CATH AND CORONARY ANGIOGRAPHY N/A 10/28/2017   Procedure: LEFT HEART CATH AND CORONARY ANGIOGRAPHY;  Surgeon: Lorretta Harp, MD;  Location: [place] [facility];  Service: Cardiovascular;  Laterality: N/A;  . PACEMAKER INSERTION  Jan 2013  . PERMANENT PACEMAKER INSERTION N/A 07/29/2011   Procedure: PERMANENT PACEMAKER INSERTION;  Surgeon: [name], MD;  Location: [facility] CATH LAB;  Service: Cardiovascular;  Laterality: N/A;  . UMBILICAL HERNIA REPAIR  1950's    OB History   No obstetric history on file.      Home Medications    Prior to Admission medications   Medication Sig Start Date End Date Taking? Authorizing Provider  Ascorbic Acid (VITAMIN C) 1000 MG tablet Take 500 mg by mouth daily.   Yes [provider]  Ascorbic Acid (VITAMIN C) 500 MG CAPS Take by mouth  daily.   Yes [provider]  calcium carbonate (OSCAL) 1500 (600 Ca) MG TABS tablet Take 1,500 mg by mouth daily with breakfast.   Yes [provider]  Calcium Carbonate-Vitamin D (CALTRATE 600+D PO) Take 1 tablet by mouth daily.    Yes [provider]  cetirizine (ZYRTEC) 10 MG tablet Take 10 mg by mouth daily as needed for allergies.    Yes [provider]  colesevelam (WELCHOL) 625 MG tablet Take 625-1,250 mg by mouth See admin instructions. Taking 1 tablet in the AM and 2 tabs (1230m) in the evening 11/08/19  Yes [provider]  diltiazem (CARDIZEM CD) 120 MG 24 hr capsule Take 1 capsule (120 mg total) by mouth 2 (two) times daily. 02/10/19  Yes KDeboraha Sprang MD  ELIQUIS 5 MG TABS tablet TAKE 1 TABLET BY MOUTH TWICE DAILY 06/22/19  Yes KDeboraha Sprang MD  ELIQUIS 5 MG TABS tablet TAKE 1 TABLET BY MOUTH TWICE DAILY 12/27/19   KDeboraha Sprang MD  furosemide (LASIX) 40 MG tablet Take 60 mg by mouth daily. 1 & 1/2 tabs= mg  08/31/19  Yes [provider]  labetalol (NORMODYNE) 300 MG tablet Take 300-450 mg by mouth See admin instructions. Taking 1 tablet in the Am (362m)  and 1 & 1/2 tablets (450) mg at night 11/01/19  Yes [provider]  levothyroxine (SYNTHROID) 25 MCG tablet Take 25 mcg by mouth daily. 11/16/19  Yes [provider]  losartan (COZAAR) 25 MG tablet Take 25 mg by mouth at bedtime. 12/14/19  Yes [provider]  Multiple Vitamins-Minerals (EYE VITAMINS PO) Take 1 tablet by mouth daily.   Yes [provider]  spironolactone (ALDACTONE) 25 MG tablet Take 1 tablet (25 mg total) by mouth daily. 11/12/19  Yes TShirley Friar PA-C  acetaminophen (TYLENOL) 500 MG tablet Take 1,000 mg by mouth every 6 (six) hours as needed for mild pain.    [provider]  Cholecalciferol (VITAMIN D3) 1000 UNITS tablet Take 1,000 Units by mouth daily.     [provider]  cholecalciferol (VITAMIN  D3) 25 MCG (1000 UNIT) tablet Take 1,000 Units by mouth daily.    [provider]  diltiazem (CARDIZEM CD) 120 MG 24 hr capsule Take 120 mg by mouth 2 (two) times daily. 11/08/19   [provider]  doxycycline (VIBRAMYCIN) 100 MG capsule Take 1 capsule (100 mg total) by mouth 2 (two) times daily for 7 days. 01/05/20 01/12/20  Hall-Potvin, [place] PA-C  ELIQUIS 5 MG TABS tablet Take 5 mg by mouth 2 (two) times daily. 11/22/19   [provider]  furosemide (LASIX) 40 MG tablet Take 1.5 tablets (60 mg total) by mouth daily. 08/31/19 11/29/19  TShirley Friar PA-C  labetalol (NORMODYNE) 300 MG tablet TAKE 1 TABLET BY MOUTH IN THE MORNING AND 1 & 1/2 TABLET IN THE EVENING. Please make yearly appt with Dr. KCaryl Comesfor May before anymore refills. 1st attempt 08/23/19   KDeboraha Sprang MD  levothyroxine (SYNTHROID, LEVOTHROID) 25 MCG tablet Take 25 mcg by mouth daily before breakfast.    [provider]  losartan (COZAAR) 25 MG tablet Take 1 tablet (25 mg total) by mouth daily. 09/14/19 12/13/19  TShirley Friar PA-C  spironolactone (ALDACTONE) 25 MG tablet Take 25 mg by mouth daily. 11/12/19   [provider]  WELCHOL 625 MG tablet Take 625 mg by mouth every morning and 1250 mg by mouth at bedtime. 05/08/12   [provider]    Family History Family History  Problem Relation Age of Onset  . Coronary artery disease Mother   . Coronary artery disease Other   . Alzheimer's disease Other   . Coronary artery disease Brother   . Coronary artery disease Brother     Social History Social History   Tobacco Use  . Smoking status: Former Smoker    Types: Cigarettes    Quit date: 07/16/1990    Years since quitting: 29.4  . Smokeless tobacco: Never Used  Vaping Use  . Vaping Use: Never used  Substance Use Topics  . Alcohol use: No  . Drug use: No     Allergies   Penicillins, Statins, Tylenol [acetaminophen], Adhesive [tape], Amiodarone,  Amiodarone, Codeine, Penicillins, Quinolones, Quinolones, Statins, Tape, and Codeine   Review of Systems As per HPI   Physical Exam Triage Vital Signs ED Triage Vitals  Enc Vitals Group     BP      Pulse      Resp      Temp      Temp src  SpO2      Weight      Height      Head Circumference      Peak Flow      Pain Score      Pain Loc      Pain Edu?      Excl. in [place]?    No data found.  Updated Vital Signs BP 117/62 (BP Location: Left Arm)   Pulse 84   Temp 97.9 F (36.6 C) (Oral)   Resp 20   SpO2 96%   Visual Acuity Right Eye Distance:   Left Eye Distance:   Bilateral Distance:    Right Eye Near:   Left Eye Near:    Bilateral Near:     Physical Exam Constitutional:      General: She is not in acute distress. HENT:     Head: Normocephalic and atraumatic.  Eyes:     General: No scleral icterus.    Pupils: Pupils are equal, round, and reactive to light.  Cardiovascular:     Rate and Rhythm: Normal rate.  Pulmonary:     Effort: Pulmonary effort is normal.  Musculoskeletal:     Right lower leg: Edema present.     Left lower leg: Edema present.  Skin:    Capillary Refill: Capillary refill takes less than 2 seconds.     Coloration: Skin is not jaundiced or pale.     Findings: Bruising, erythema and rash present.     Comments: Left leg, no discharge.  Mild warmth as compared to right.  Tender  Neurological:     General: No focal deficit present.     Mental Status: She is alert and oriented to person, place, and time.      UC Treatments / Results  Labs (all labs ordered are listed, but only abnormal results are displayed) Labs Reviewed - No data to display  EKG   Radiology No results found.  Procedures Procedures (including critical care time)  Medications Ordered in UC Medications - No data to display  Initial Impression / Assessment and Plan / UC Course  I have reviewed the triage vital signs and the nursing notes.  Pertinent  labs & imaging results that were available during my care of the patient were reviewed by me and considered in my medical decision making (see chart for details).     Patient afebrile, nontoxic, and hemodynamically stable in office.  Overall, patient appears well, though left lower leg concerning for vasculitis second to cellulitis from previously open wound.  Given patient's age, comorbidities/risk for sequela due to cellulitis, will start doxycycline.  Wrapped legs with Ace wraps for compression as patient has difficulty using compression socks, discussed importance of compliance with Lasix.  Will follow up with PCP and cardiology tomorrow.  Return precautions discussed, patient verbalized understanding and is agreeable to plan. Final Clinical Impressions(s) / UC Diagnoses   Final diagnoses:  Thinning of skin  Bilateral lower extremity edema  Pain of left lower extremity  Cellulitis of left lower extremity     Discharge Instructions     Take anabiotic twice daily with food. Important follow-up with your PCP in 1 week for repeat evaluation. Keep appointment with cardiology for further management of your Lasix: Important to take this every day. Go to ER for worsening pain, swelling, difficulty walking, fever, chest pain, difficulty breathing.    ED Prescriptions    Medication Sig Dispense Auth. Provider   doxycycline (VIBRAMYCIN) 100 MG capsule Take 1  capsule (100 mg total) by mouth 2 (two) times daily for 7 days. 14 capsule Hall-Potvin, [place], PA-C     PDMP not reviewed this encounter.   Hall-Potvin, [place], [place] 01/05/20 1543

## 2020-01-05 NOTE — Telephone Encounter (Signed)
Spoke with pt who is complaining of continued bilateral leg swelling with new redness of her lower left leg.  Pt reports it is warm to touch.  Pt states she had an open draining area earlier in the week but it now looks scabbed over.  She denies fever, nausea, or vomiting.  Pt advised to contact her PCP for further evaluation.  Pt states she has called her PCP and was unable to get an appointment until next week.  Recommended pt go to urgent care today for further evaluation.  Pt verbalizes understanding and agrees with current plan.

## 2020-01-05 NOTE — Discharge Instructions (Signed)
Take anabiotic twice daily with food. Important follow-up with your PCP in 1 week for repeat evaluation. Keep appointment with cardiology for further management of your Lasix: Important to take this every day. Go to ER for worsening pain, swelling, difficulty walking, fever, chest pain, difficulty breathing.

## 2020-01-05 NOTE — Telephone Encounter (Signed)
New message   Pt c/o swelling: STAT is pt has developed SOB within 24 hours  1) How much weight have you gained and in what time span? No per patient   2) If swelling, where is the swelling located? Both legs and feet   3) Are you currently taking a fluid pill? Yes   4) Are you currently SOB? No   5) Do you have a log of your daily weights (if so, list)?no   6) Have you gained 3 pounds in a day or 5 pounds in a week? No   7) Have you traveled recently? No

## 2020-01-05 NOTE — ED Triage Notes (Signed)
Pt presents today for bilateral lower extremity swelling (left markedly worse than right). Pt states she takes 31m lasix daily, did not take this morning r/t difficulty ambulation and frequent bathroom use. Pt legs noted to be red, swollen, and warm to touch.

## 2020-01-06 ENCOUNTER — Telehealth: Payer: Self-pay | Admitting: Internal Medicine

## 2020-01-06 NOTE — Telephone Encounter (Signed)
Spoke with pt who requests to verify how Lasix is to be taken according to her record.  Pt advised per historical provider report she is to be taking 1-1/2 tablets by mouth daily.   Pt states she received new Rx today from pharmacy that has her taking 85m 1 tablet daily alternating 2 tablets daily.  Pt states she will contact pharmacy as she cannot tolerate alternating the 2 doses  due to urinating so much with the 2 tablets.  Pt thanked RTherapist, sportsfor the call.

## 2020-01-06 NOTE — Telephone Encounter (Signed)
New Message  Pt c/o medication issue:  1. Name of Medication: furosemide (LASIX) 40 MG tablet  2. How are you currently taking this medication (dosage and times per day)? Says to take 1 pill daily then alternate  Pt says she was taking it a little bit differently   3. Are you having a reaction (difficulty breathing--STAT)? No   4. What is your medication issue? Pt is calling and is wondering how she needs to be taking this medication  Please call

## 2020-01-10 DIAGNOSIS — S52501D Unspecified fracture of the lower end of right radius, subsequent encounter for closed fracture with routine healing: Secondary | ICD-10-CM | POA: Diagnosis not present

## 2020-01-10 DIAGNOSIS — I714 Abdominal aortic aneurysm, without rupture: Secondary | ICD-10-CM | POA: Diagnosis not present

## 2020-01-10 DIAGNOSIS — I2510 Atherosclerotic heart disease of native coronary artery without angina pectoris: Secondary | ICD-10-CM | POA: Diagnosis not present

## 2020-01-10 DIAGNOSIS — I110 Hypertensive heart disease with heart failure: Secondary | ICD-10-CM | POA: Diagnosis not present

## 2020-01-10 DIAGNOSIS — S62101D Fracture of unspecified carpal bone, right wrist, subsequent encounter for fracture with routine healing: Secondary | ICD-10-CM | POA: Diagnosis not present

## 2020-01-10 DIAGNOSIS — I051 Rheumatic mitral insufficiency: Secondary | ICD-10-CM | POA: Diagnosis not present

## 2020-01-10 DIAGNOSIS — M25532 Pain in left wrist: Secondary | ICD-10-CM | POA: Diagnosis not present

## 2020-01-10 DIAGNOSIS — K7581 Nonalcoholic steatohepatitis (NASH): Secondary | ICD-10-CM | POA: Diagnosis not present

## 2020-01-10 DIAGNOSIS — S62102D Fracture of unspecified carpal bone, left wrist, subsequent encounter for fracture with routine healing: Secondary | ICD-10-CM | POA: Diagnosis not present

## 2020-01-10 DIAGNOSIS — I5030 Unspecified diastolic (congestive) heart failure: Secondary | ICD-10-CM | POA: Diagnosis not present

## 2020-01-10 DIAGNOSIS — M25531 Pain in right wrist: Secondary | ICD-10-CM | POA: Diagnosis not present

## 2020-01-10 DIAGNOSIS — S52502D Unspecified fracture of the lower end of left radius, subsequent encounter for closed fracture with routine healing: Secondary | ICD-10-CM | POA: Diagnosis not present

## 2020-01-10 DIAGNOSIS — I480 Paroxysmal atrial fibrillation: Secondary | ICD-10-CM | POA: Diagnosis not present

## 2020-01-12 DIAGNOSIS — I051 Rheumatic mitral insufficiency: Secondary | ICD-10-CM | POA: Diagnosis not present

## 2020-01-12 DIAGNOSIS — I5030 Unspecified diastolic (congestive) heart failure: Secondary | ICD-10-CM | POA: Diagnosis not present

## 2020-01-12 DIAGNOSIS — S62102D Fracture of unspecified carpal bone, left wrist, subsequent encounter for fracture with routine healing: Secondary | ICD-10-CM | POA: Diagnosis not present

## 2020-01-12 DIAGNOSIS — K7581 Nonalcoholic steatohepatitis (NASH): Secondary | ICD-10-CM | POA: Diagnosis not present

## 2020-01-12 DIAGNOSIS — I480 Paroxysmal atrial fibrillation: Secondary | ICD-10-CM | POA: Diagnosis not present

## 2020-01-12 DIAGNOSIS — I714 Abdominal aortic aneurysm, without rupture: Secondary | ICD-10-CM | POA: Diagnosis not present

## 2020-01-12 DIAGNOSIS — I110 Hypertensive heart disease with heart failure: Secondary | ICD-10-CM | POA: Diagnosis not present

## 2020-01-12 DIAGNOSIS — S62101D Fracture of unspecified carpal bone, right wrist, subsequent encounter for fracture with routine healing: Secondary | ICD-10-CM | POA: Diagnosis not present

## 2020-01-12 DIAGNOSIS — I2510 Atherosclerotic heart disease of native coronary artery without angina pectoris: Secondary | ICD-10-CM | POA: Diagnosis not present

## 2020-01-13 ENCOUNTER — Encounter: Payer: Self-pay | Admitting: Cardiothoracic Surgery

## 2020-01-13 ENCOUNTER — Ambulatory Visit: Payer: Medicare PPO | Admitting: Cardiothoracic Surgery

## 2020-01-13 ENCOUNTER — Ambulatory Visit
Admission: RE | Admit: 2020-01-13 | Discharge: 2020-01-13 | Disposition: A | Discharge status: Home / Self Care | Payer: Medicare PPO | Source: Ambulatory Visit | Attending: Cardiothoracic Surgery | Admitting: Cardiothoracic Surgery

## 2020-01-13 ENCOUNTER — Other Ambulatory Visit: Payer: Self-pay

## 2020-01-13 VITALS — BP 123/73 | HR 90 | Temp 97.6°F | Resp 18 | Ht 63.0 in | Wt 151.0 lb

## 2020-01-13 DIAGNOSIS — I712 Thoracic aortic aneurysm, without rupture, unspecified: Secondary | ICD-10-CM

## 2020-01-13 DIAGNOSIS — I517 Cardiomegaly: Secondary | ICD-10-CM | POA: Diagnosis not present

## 2020-01-13 DIAGNOSIS — I700 Atherosclerosis of aorta: Secondary | ICD-10-CM | POA: Diagnosis not present

## 2020-01-13 MED ORDER — IOPAMIDOL (ISOVUE-370) INJECTION 76%
75.0000 mL | Freq: Once | INTRAVENOUS | Status: AC | PRN
  Administered 2020-01-13: 75 mL via INTRAVENOUS

## 2020-01-13 NOTE — Progress Notes (Signed)
[address]       [place] [address]             [phone]        [name] [name] [facility] Medical Record #[id number] Date of Birth: 1931/04/18  Referring: [name], MD Primary Care: [name], MD Primary Cardiology: Virl Axe, MD  Chief Complaint:    Chief Complaint  Patient presents with  . Thoracic Aortic Aneurysm    yearly f/u with CTA chest today    History of Present Illness:    Patient is a 86 year old female with a history of atrial fib on Coumadin  .  July 29 2011 a pacemaker was placed. A followup an echocardiogram was performed that demonstrated dilated ascending aorta leading to  evaluation with a CT scan of the chest .  She has no history of aortic valve disease.  Patient has been followed's in the thoracic surgery office since March 2012  She has no family history of aneurysm disease or sudden death, her grandfather did die in his 97s of a cerebral aneurysm.  Since last seen the patient has fallen twice and injured both of her wrists, currently both are in splints.  She is now using a rolling walker to help assist in her mobility.   She comes in today for a follow-up CTA of the chest to evaluate size of her aorta, in spite of her age she wanted to keep monitoring this.  She does note increasing episodes of pedal edema on left leg worse than the right, mostly toward the end of the day is it most noticeable.  Current Activity/ Functional Status: Patient is independent with mobility/ambulation, transfers, ADL's, IADL's.   Zubrod Score: At the time of surgery this patient's most appropriate activity status/level should be described as: []     0    Normal activity, no symptoms []     1    Restricted in physical strenuous activity but ambulatory, able to do out light work [x]     2    Ambulatory and capable of self care, unable to do work activities, up and about >50 % of waking hours                              []     3    Only  limited self care, in bed greater than 50% of waking hours []     4    Completely disabled, no self care, confined to bed or chair []     5    Moribund   Past Medical History:  Diagnosis Date  . CAD (coronary artery disease)    a. LHC 10/2017 30% RCA otherwise OK.  . Chronic diastolic CHF (congestive heart failure) ([place])   . Hemorrhoids    current problem as of 04/26/16  with slight bleeding of hemorrhoids   . Hepatitis    hx of medication induced hepatitis (Vytorin per pt)  . History of kidney stones   . HLD (hyperlipidemia)   . HTN (hypertension)   . Hx of echocardiogram    echo 1/13: EF 55%, Gr 2 diast dysfn, Asc Ao aneurysm 4.8 cm, mild MR, mod LAE, mild RAE  . Hypothyroidism   . ICH (intracerebral hemorrhage) ([facility]) 03/24/15  . Lower extremity edema   . NASH (nonalcoholic steatohepatitis)   . Neuromuscular disorder ([place])   . Pacemaker-Medtronic 11/12/2011   Implanted 2013   .  Permanent atrial fibrillation ([place] [facility])   . PMR (polymyalgia rheumatica) ([facility])    no steriods for 5 years  . sinus node dysfunction//post termination pauses   . Stroke ([place])   . Thoracic aortic aneurysm (TAA) ([place] [facility])    a. followed by Dr. Servando Snare.  . Tricuspid regurgitation   . Urinary tract infection    Recurrent infections.     Past Surgical History:  Procedure Laterality Date  . CARDIAC CATHETERIZATION    . CARDIOVERSION  03/02/2012   Procedure: CARDIOVERSION;  Surgeon: Lelon Perla, MD;  Location: [facility] ENDOSCOPY;  Service: Cardiovascular;  Laterality: N/A;  . CARDIOVERSION  03/13/2012   Procedure: CARDIOVERSION;  Surgeon: Josue Hector, MD;  Location: [facility];  Service: Cardiovascular;  Laterality: N/A;  . CARDIOVERSION  04/15/2012   Procedure: CARDIOVERSION;  Surgeon: [name], MD;  Location: [facility];  Service: Cardiovascular;  Laterality: N/A;  . CARDIOVERSION N/A 06/07/2014   Procedure: CARDIOVERSION;  Surgeon: [name], MD;  Location: [facility] ENDOSCOPY;  Service:  Cardiovascular;  Laterality: N/A;  . CARDIOVERSION N/A 08/18/2014   Procedure: CARDIOVERSION;  Surgeon: [name], MD;  Location: [place];  Service: Cardiovascular;  Laterality: N/A;  . CARDIOVERSION N/A 09/06/2015   Procedure: CARDIOVERSION;  Surgeon: Jerline Pain, MD;  Location: [facility];  Service: Cardiovascular;  Laterality: N/A;  . CYSTOSCOPY WITH STENT PLACEMENT Right 04/22/2016   Procedure: CYSTOSCOPY WITH RIGHT URETERAL STENT INSERTION;  Surgeon: [name], MD;  Location: [facility];  Service: Urology;  Laterality: Right;  . CYSTOSCOPY WITH URETEROSCOPY, STONE BASKETRY AND STENT PLACEMENT Right 04/30/2016   Procedure: CYSTOSCOPY WITH URETEROSCOPY, STENT PLACEMENT REMOVAL;  Surgeon: [name], MD;  Location: [facility];  Service: Urology;  Laterality: Right;  . HERNIA REPAIR    . INCISION AND DRAINAGE BREAST ABSCESS  1973  . LEFT HEART CATH AND CORONARY ANGIOGRAPHY N/A 10/28/2017   Procedure: LEFT HEART CATH AND CORONARY ANGIOGRAPHY;  Surgeon: Lorretta Harp, MD;  Location: [facility] CV LAB;  Service: Cardiovascular;  Laterality: N/A;  . PACEMAKER INSERTION  Jan 2013  . PERMANENT PACEMAKER INSERTION N/A 07/29/2011   Procedure: PERMANENT PACEMAKER INSERTION;  Surgeon: [name], MD;  Location: [facility] CATH LAB;  Service: Cardiovascular;  Laterality: N/A;  . UMBILICAL HERNIA REPAIR  1950's    Family History  Problem Relation Age of Onset  . Coronary artery disease Mother   . Coronary artery disease Other   . Alzheimer's disease Other   . Coronary artery disease Brother   . Coronary artery disease Brother       Social History   Tobacco Use  Smoking Status Former Smoker  . Types: Cigarettes  . Quit date: 07/16/1990  . Years since quitting: 29.5  Smokeless Tobacco Never Used    Social History   Substance and Sexual Activity  Alcohol Use No     Allergies  Allergen Reactions  . Penicillins Anaphylaxis    Has patient had a PCN reaction causing immediate rash,  facial/tongue/throat swelling, SOB or lightheadedness with hypotension: Yes Has patient had a PCN reaction causing severe rash involving mucus membranes or skin necrosis: No Has patient had a PCN reaction that required hospitalization No Has patient had a PCN reaction occurring within the last 10 years: No If all of the above answers are "NO", then may proceed with Cephalosporin use.   . Statins Other (See Comments)    REACTION: elevated LFT's  . Tylenol [Acetaminophen] Other (See Comments)    If taken  with Vytorin at risk for liver damage  . Adhesive [Tape] Other (See Comments)    Irritates skin  . Amiodarone Other (See Comments)  . Amiodarone Other (See Comments)    unk  . Codeine Other (See Comments)    Weird feeling  . Penicillins Swelling  . Quinolones   . Quinolones Other (See Comments)    UNk  . Statins Other (See Comments)    Liver enzymes elevated  . Tape Other (See Comments)    Redness, Please use "paper" tape only.  . Codeine Other (See Comments)    feel funny, head is fuzzy    Current Outpatient Medications  Medication Sig Dispense Refill  . acetaminophen (TYLENOL) 500 MG tablet Take 1,000 mg by mouth every 6 (six) hours as needed for mild pain.    . Ascorbic Acid (VITAMIN C) 1000 MG tablet Take 500 mg by mouth daily.    . calcium carbonate (OSCAL) 1500 (600 Ca) MG TABS tablet Take 1,500 mg by mouth daily with breakfast.    . Calcium Carbonate-Vitamin D (CALTRATE 600+D PO) Take 1 tablet by mouth daily.     . cetirizine (ZYRTEC) 10 MG tablet Take 10 mg by mouth daily as needed for allergies.     . Cholecalciferol (VITAMIN D3) 1000 UNITS tablet Take 1,000 Units by mouth daily.     . colesevelam (WELCHOL) 625 MG tablet Take 625-1,250 mg by mouth See admin instructions. Taking 1 tablet in the AM and 2 tabs (1244m) in the evening    . diltiazem (CARDIZEM CD) 120 MG 24 hr capsule Take 120 mg by mouth 2 (two) times daily.    .Marland KitchenELIQUIS 5 MG TABS tablet TAKE 1 TABLET BY  MOUTH TWICE DAILY 180 tablet 1  . labetalol (NORMODYNE) 300 MG tablet TAKE 1 TABLET BY MOUTH IN THE MORNING AND 1 & 1/2 TABLET IN THE EVENING. Please make yearly appt with Dr. KCaryl Comesfor May before anymore refills. 1st attempt 225 tablet 1  . levothyroxine (SYNTHROID, LEVOTHROID) 25 MCG tablet Take 25 mcg by mouth daily before breakfast.    . Multiple Vitamins-Minerals (EYE VITAMINS PO) Take 1 tablet by mouth daily.    .Marland Kitchenspironolactone (ALDACTONE) 25 MG tablet Take 1 tablet (25 mg total) by mouth daily. 90 tablet 3  . WELCHOL 625 MG tablet Take 625 mg by mouth every morning and 1250 mg by mouth at bedtime.    . furosemide (LASIX) 40 MG tablet Take 1.5 tablets (60 mg total) by mouth daily. 90 tablet 3  . losartan (COZAAR) 25 MG tablet Take 1 tablet (25 mg total) by mouth daily. 90 tablet 3   No current facility-administered medications for this visit.       Review of Systems:      Immunizations: Flu [Blue.Reese ]; Pneumococcal[y  ];CoVID x2   Physical Exam: BP 123/73 (BP Location: Left Arm, Patient Position: Sitting, Cuff Size: Normal)   Pulse 90   Temp 97.6 F (36.4 C)   Resp 18   Ht 5' 3"  (1.6 m)   Wt 151 lb (68.5 kg)   SpO2 96% Comment: RA  BMI 26.75 kg/m  General appearance: alert, cooperative and no distress Head: Normocephalic, without obvious abnormality, atraumatic Neck: no adenopathy, no carotid bruit, no JVD, supple, symmetrical, trachea midline and thyroid not enlarged, symmetric, no tenderness/mass/nodules Lymph nodes: Cervical, supraclavicular, and axillary nodes normal. Resp: clear to auscultation bilaterally Cardio: regular rate and rhythm, S1, S2 normal, no murmur, click, rub or gallop GI:  soft, non-tender; bowel sounds normal; no masses,  no organomegaly Extremities: extremities with bilateral wrists braces in place, bilateral pedal edema left greater than right Neurologic: Grossly normal    Diagnostic Studies & Laboratory data:     Recent Radiology Findings:   CT  HEAD WO CONTRAST  Result Date: 12/16/2019 CLINICAL DATA:  86 year old female with posttraumatic headache. EXAM: CT HEAD WITHOUT CONTRAST TECHNIQUE: Contiguous axial images were obtained from the base of the skull through the vertex without intravenous contrast. COMPARISON:  Head CT dated 04/06/2015. FINDINGS: Brain: Mild age-related atrophy and chronic microvascular ischemic changes. Focal area of white matter hypodensity in the left parietal convexity sequela of previously seen hemorrhage. There is no acute intracranial hemorrhage. No mass effect or midline shift. No extra-axial fluid collection. Vascular: No hyperdense vessel or unexpected calcification. Skull: Normal. Negative for fracture or focal lesion. Sinuses/Orbits: No acute finding. Other: None IMPRESSION: 1. No acute intracranial hemorrhage. 2. Mild age-related atrophy and chronic microvascular ischemic changes. Sequela of prior hemorrhage in the left parietal convexity. Electronically Signed   By: Anner Crete M.D.   On: 12/16/2019 17:14   DG Shoulder Left  Result Date: 12/16/2019 CLINICAL DATA:  Posterior left shoulder pain after fall EXAM: LEFT SHOULDER - 2+ VIEW COMPARISON:  None. FINDINGS: Osseous structures appear demineralized. There is no evidence of fracture or dislocation. Mild arthropathy of the [facility] joint. Glenohumeral joint appears within normal limits. Soft tissues are unremarkable. Left-sided implanted cardiac device is noted. Atherosclerotic calcification of the aortic knob. IMPRESSION: Negative. Electronically Signed   By: Davina Poke D.O.   On: 12/16/2019 15:26   CT ANGIO CHEST AORTA W/CM & OR WO/CM  Result Date: 01/13/2020 CLINICAL DATA:  Thoracic aortic aneurysm. EXAM: CT ANGIOGRAPHY CHEST WITH CONTRAST TECHNIQUE: Multidetector CT imaging of the chest was performed using the standard protocol during bolus administration of intravenous contrast. Multiplanar CT image reconstructions and MIPs were obtained to evaluate the  vascular anatomy. CONTRAST:  27m ISOVUE-370 IOPAMIDOL (ISOVUE-370) INJECTION 76% COMPARISON:  December 30, 2018. FINDINGS: Cardiovascular: Grossly stable 4.9 cm aneurysm is seen involving the ascending thoracic aorta. Atherosclerosis of thoracic aorta is noted without dissection. Mild cardiomegaly is noted. No pericardial effusion is noted. Great vessels are widely patent without significant stenosis. Mediastinum/Nodes: No enlarged mediastinal, hilar, or axillary lymph nodes. Thyroid gland, trachea, and esophagus demonstrate no significant findings. Lungs/Pleura: Lungs are clear. No pleural effusion or pneumothorax. Upper Abdomen: No acute abnormality. Musculoskeletal: No chest wall abnormality. No acute or significant osseous findings. Review of the MIP images confirms the above findings. IMPRESSION: 1. Grossly stable 4.9 cm ascending thoracic aortic aneurysm. No dissection is noted. Recommend semi-annual imaging followup by CTA or MRA and referral to cardiothoracic surgery if not already obtained. This recommendation follows 2010 ACCF/AHA/AATS/ACR/ASA/SCA/SCAI/SIR/STS/SVM Guidelines for the Diagnosis and Management of Patients With Thoracic Aortic Disease. Circulation. 2010; 121:: F810-F751 Aortic aneurysm NOS (ICD10-I71.9). Aortic Atherosclerosis (ICD10-I70.0). Electronically Signed   By: JMarijo ConceptionM.D.   On: 01/13/2020 13:59    Ct Angio Chest Aorta W/cm &/or Wo/cm  Result Date: 12/30/2018 CLINICAL DATA:  Follow-up of aneurysmal disease of the ascending thoracic aorta. EXAM: CT ANGIOGRAPHY CHEST WITH CONTRAST TECHNIQUE: Multidetector CT imaging of the chest was performed using the standard protocol during bolus administration of intravenous contrast. Multiplanar CT image reconstructions and MIPs were obtained to evaluate the vascular anatomy. CONTRAST:  731mISOVUE-370 IOPAMIDOL (ISOVUE-370) INJECTION 76% Creatinine was obtained on site at [facility] [address] Results: Creatinine  0.9  mg/dL.  Estimated GFR 58 mL/minute COMPARISON:  07/22/2017, 07/04/2016 and other prior studies. FINDINGS: Cardiovascular: The aortic root is of normal caliber at the sinuses of Valsalva and measures approximately 3.1 cm. The ascending thoracic aorta shows stable maximal diameter of approximately 4.8 cm. The proximal arch measures 3.5 cm and the distal arch 3.4 cm. The descending thoracic aorta measures approximately 2.8 cm. There remains stable mild focal dilatation of the distal descending thoracic aorta just above the diaphragmatic hiatus measuring approximately 3.3 x 3.8 cm. There is no evidence of aortic dissection. Proximal great vessels demonstrate normal patency and branching anatomy. The heart size is stable. There is stable appearance a dual-chamber pacemaker. No pericardial fluid. No significant calcified coronary artery plaque identified. Central pulmonary arteries are normal in caliber. Mediastinum/Nodes: Stable scattered small mediastinal lymph nodes without evidence of enlarged mediastinal or hilar lymph nodes. Lungs/Pleura: Stable bibasilar scarring. There is no evidence of pulmonary edema, consolidation, pneumothorax, nodule or pleural fluid. Upper Abdomen: Stable probable small hiatal hernia. No acute abnormality. Musculoskeletal: No chest wall abnormality. No acute or significant osseous findings. Review of the MIP images confirms the above findings. IMPRESSION: Stable aneurysmal disease of the ascending thoracic aorta measuring approximately 4.8 cm in greatest diameter. There also is stable focal dilatation of the distal descending thoracic aorta just above the diaphragmatic hiatus. Aortic aneurysm NOS (ICD10-I71.9). Electronically Signed   By: Aletta Edouard M.D.   On: 12/30/2018 12:57   Ct Angio Chest Aorta W/cm &/or Wo/cm  Result Date: 07/22/2017 CLINICAL DATA:  Follow-up thoracic aortic aneurysm EXAM: CT ANGIOGRAPHY CHEST WITH CONTRAST TECHNIQUE: Multidetector CT imaging of the chest was  performed using the standard protocol during bolus administration of intravenous contrast. Multiplanar CT image reconstructions and MIPs were obtained to evaluate the vascular anatomy. CONTRAST:  91m ISOVUE-370 IOPAMIDOL (ISOVUE-370) INJECTION 76% COMPARISON:  07/04/2016 FINDINGS: Cardiovascular: Thoracic aortic aneurysm again noted, maximally measuring 4.7 cm in the proximal ascending thoracic aorta. Aortic arch mildly aneurysmal at 3.3 cm distally. These compared to 4.9 and 3.5 cm previously. Distal descending thoracic aorta near the hiatus measures 3.8 cm compared with 3.7 cm previously. Diffuse aortic atherosclerosis. Heart mildly enlarged. Pacer wires noted in the right heart. Mediastinum/Nodes: No mediastinal, hilar, or axillary adenopathy. Trachea and esophagus are unremarkable. Lungs/Pleura: Mild emphysema. Scarring in the lower lung fields. No confluent opacities or effusions. No suspicious pulmonary nodules. Upper Abdomen: Imaging into the upper abdomen shows no acute findings. Aorta at the hiatus measures 2.8 cm. Musculoskeletal: Chest wall soft tissues are unremarkable. No acute bony abnormality. Review of the MIP images confirms the above findings. IMPRESSION: Thoracic aortic aneurysm, largest in the proximal ascending thoracic aorta at 4.7 cm, also noted within the aortic arch and distal descending thoracic aorta. No significant change since prior study. Recommend semi-annual imaging followup by CTA or MRA and referral to cardiothoracic surgery if not already obtained. This recommendation follows 2010 ACCF/AHA/AATS/ACR/ASA/SCA/SCAI/SIR/STS/SVM Guidelines for the Diagnosis and Management of Patients With Thoracic Aortic Disease. Circulation. 2010; 121:: [id number] Atherosclerosis (ICD10-I70.0) and Emphysema (ICD10-J43.9). Electronically Signed   By: KRolm BaptiseM.D.   On: 07/22/2017 11:35    Ct Angio Chest Aorta W/cm &/or Wo/cm  Result Date: 07/04/2016 CLINICAL DATA:  FU TAA WO RUPTURE X 3  YRS NO PERTINENT SX NO HX CA DM HX PACEMAKER HTN PRIORS IN PACS EXAM: CT ANGIOGRAPHY CHEST WITH CONTRAST TECHNIQUE: Multidetector CT imaging of the chest was performed using the standard protocol during bolus administration of intravenous contrast.  Multiplanar CT image reconstructions and MIPs were obtained to evaluate the vascular anatomy. CONTRAST:  75 mL Isovue-300 IV GFR = 41, USE 60 CC CONTRAST PER DR WAGNER COMPARISON:  11/01/2015 FINDINGS: Cardiovascular: Left subclavian Transvenous pacing leads extend into the right atrium and towards the right ventricular apex. Dilated central pulmonary arteries. Incomplete opacification of pulmonary artery branches ; the exam was not optimized for detection of pulmonary emboli. Patent bilateral pulmonary veins drain into the left atrium. Left atrial enlargement. Scattered coronary calcifications. Thoracic aortic aneurysm with measurements as follows: 3.6 cm sinuses of Valsalva 3.4 cm sino-tubular junction 4.9 cm proximal ascending (stable since previous exam) 4 cm distal ascending/proximal arch 3 cm distal arch 3.5 cm proximal descending 3.7 cm saccular aneurysm distal descending at the level of the right lateral penetrating atheromatous ulcer, previously 3.6 cm 2.7 cm distal descending above the diaphragm. There is classic 3 vessel brachiocephalic arterial origin anatomy without proximal stenosis. Moderate calcified plaque throughout the thoracic aorta. Mediastinum/Nodes: No pericardial effusion. No mediastinal hematoma. subcentimeter mediastinal lymph nodes. No hilar adenopathy. Lungs/Pleura: No pleural effusion. No pneumothorax. Linear scarring posteriorly at the right lung base. Upper Abdomen: No acute abnormality. Stable 15 mm exophytic probable cyst from the upper pole right kidney. Musculoskeletal: Anterior vertebral endplate spurring at multiple levels in the mid and lower thoracic spine. Review of the MIP images confirms the above findings. IMPRESSION: 1. Stable  4.9 cm ascending aortic aneurysm without complicating features. 2. Continued enlargement of eccentric penetrating atheromatous ulcer with small saccular aneurysm in the distal descending thoracic aorta. Electronically Signed   By: Lucrezia Europe M.D.   On: 07/04/2016 16:56   Ct Angio Chest Aorta W/cm &/or Wo/cm  11/01/2015  CLINICAL DATA:  Followup thoracic aortic aneurysm EXAM: CT ANGIOGRAPHY CHEST WITH CONTRAST TECHNIQUE: Multidetector CT imaging of the chest was performed using the standard protocol during bolus administration of intravenous contrast. Multiplanar CT image reconstructions and MIPs were obtained to evaluate the vascular anatomy. Creatinine was obtained on site at [facility] at [address] Results: Creatinine 1.0 mg/dL. CONTRAST:  75 mL Isovue 370. COMPARISON:  10/20/2014 FINDINGS: Vascular: The thoracic aorta is again well visualized. There is dilatation of the ascending aorta stable at 4.9 cm. Some mild motion artifact is noted. It measures approximately 33 mm at the sino-tubular junction an approximately 36 mm at the level of the sinus of Valsalva. Stable diameter of 3.1 cm in the distal aortic arch is noted. Some tapering is seen distal similar to that noted on the prior exam. Calcific plaque is seen without evidence of dissection. Although not timed for pulmonary embolism evaluation no large central embolus is noted within the pulmonary artery. The brachiocephalic vessels appear within normal limits with mild atherosclerotic changes. The visualized portions of the upper abdominal visceral vessels appear patent. Nonvascular: Some minimal scarring is noted in the right lower lobe stable from the prior exam. No focal infiltrate or sizable effusion is seen. No sizable parenchymal nodule is seen. Mild foci pleural thickening are seen posteriorly particularly on the left. No sizable hilar or mediastinal adenopathy is noted. The visualized portions of the upper abdomen are within  normal limits. A small exophytic cyst is again noted from the upper pole of the right kidney. No acute bony abnormality is seen. Review of the MIP images confirms the above findings. IMPRESSION: Stable appearing aneurysmal dilatation of the thoracic aorta as described. Ascending thoracic aortic aneurysm. Recommend semi-annual imaging followup by CTA or MRA and referral to  cardiothoracic surgery if not already obtained. This recommendation follows 2010 ACCF/AHA/AATS/ACR/ASA/SCA/SCAI/SIR/STS/SVM Guidelines for the Diagnosis and Management of Patients With Thoracic Aortic Disease. Circulation. 2010; 121: [id number] No new focal abnormality is seen. Electronically Signed   By: Inez Catalina M.D.   On: 11/01/2015 15:02   Ct Angio Chest Aorta W/cm &/or Wo/cm  11/04/2013   CLINICAL DATA:  Ascending aortic enlargement  EXAM: CT ANGIOGRAPHY CHEST WITH CONTRAST  TECHNIQUE: Multidetector CT imaging of the chest was performed using the standard protocol during bolus administration of intravenous contrast. Multiplanar CT image reconstructions and MIPs were obtained to evaluate the vascular anatomy.  CONTRAST:  15m OMNIPAQUE IOHEXOL 350 MG/ML SOLN  COMPARISON:  09/23/2012  FINDINGS: Stable small hypodense thyroid lesions. Dual lead pacer noted. At the level of the pulmonary artery on image 57 of series 4, the ascending aortic transverse caliber is 4.9 cm, no change from 09/23/2012. Central arch 3.2 cm on image 28 of series 4 (stable) and proximal descending thoracic aorta 3.1 cm (likewise stable) if scattered atherosclerotic calcification involving the ascending aorta, aortic arch, descending thoracic aorta, and branch vasculature noted. Mild cardiomegaly is again noted. No pathologic thoracic adenopathy or pericardial effusion.  There is a slightly greater amount of right eccentric mural thrombus in the descending thoracic aorta adjacent to the aortic hiatus on image 89 of series 4. The celiac trunk and nodes prompt proximal  branches appear patent. An exophytic right kidney upper pole lesion is likely a cyst, measuring 13 Hounsfield units.  Stable mild right lower lobe scarring. Thoracic kyphosis and thoracic spondylosis noted.  Review of the MIP images confirms the above findings.  IMPRESSION: 1. No change in size or appearance of ascending aortic aneurysm (which measures up to 4.9 cm) or descending thoracic aortic ectasia. 2. Thoracic kyphosis and spondylosis.   Electronically Signed   By: WSherryl BartersM.D.   On: 11/04/2013 09:10   Ct Angio Chest W/cm &/or Wo Cm  09/23/2012  *RADIOLOGY REPORT*  Clinical Data: Follow-up ascending aortic aneurysm.  CT ANGIOGRAPHY CHEST  Technique:  Multidetector CT imaging of the chest using the standard protocol during bolus administration of intravenous contrast. Multiplanar reconstructed images including MIPs were obtained and reviewed to evaluate the vascular anatomy.  Contrast: 875mOMNIPAQUE IOHEXOL 350 MG/ML SOLN  Comparison: 01/28/2012.  Findings: Low attenuation lesions in the thyroid measure up to 1.4 cm on the right, as before.  Ascending aorta measures up to 4.9 cm, unchanged from the prior examination.  Transverse aorta measures up to 3.2 cm and proximal descending thoracic aorta, 3.1 cm, also unchanged.  There is atherosclerotic irregularity and calcification of the aorta with calcification seen in the coronary arteries. Heart is mildly enlarged.  No pericardial effusion.  No pathologically enlarged mediastinal, hilar or axillary lymph nodes.  Scattered scarring in the right lower lobe and along the left major fissure.  No pleural fluid.  Airway is unremarkable.  Incidental imaging of the upper abdomen shows no acute findings. No worrisome lytic or sclerotic lesions.  IMPRESSION:  1.  Stable ascending aortic aneurysm. Transverse and descending thoracic aortic diameters are stable as well. 2.  Coronary artery calcification.   Original Report Authenticated By: MeLorin PicketM .      Followup CT scan of the chest done July 2013 Ct Angio Chest Aorta W/cm &/or Wo/cm  10/20/2014   CLINICAL DATA:  Thoracic aortic aneurysm.  EXAM: CT ANGIOGRAPHY CHEST WITH CONTRAST  TECHNIQUE: Multidetector CT imaging of the chest was performed using  the standard protocol during bolus administration of intravenous contrast. Multiplanar CT image reconstructions and MIPs were obtained to evaluate the vascular anatomy.  CONTRAST:  75 mL of Isovue 370 intravenously.  COMPARISON:  CT scan of November 04, 2013.  FINDINGS: No pneumothorax or pleural effusion is noted. No acute pulmonary disease is noted. Ascending thoracic aorta has maximum measured diameter of 4.9 cm which is not significantly changed compared to prior exam. Transverse aortic arch measures 3.1 cm which is not significantly changed compared to prior exam. Proximal portion of descending thoracic aorta measures 3.1 cm which is not significantly changed compared to prior exam. No dissection is noted. Atherosclerotic calcifications are again noted. No mediastinal mass or adenopathy is noted. Left-sided pacemaker is unchanged in position. Pulmonary arteries appear grossly normal. Stable mild cardiomegaly is noted. Stable exophytic cyst is seen arising from upper pole of right kidney.  Review of the MIP images confirms the above findings.  IMPRESSION: Stable ascending thoracic aortic aneurysm with maximum measured diameter 4.9 cm. No significant changes noted compared to prior exam.   Electronically Signed   By: Marijo Conception, M.D.   On: 10/20/2014 13:17    Ct Angio Chest W/cm &/or Wo Cm  01/28/2012  *RADIOLOGY REPORT*  Clinical Data: Follow-up aortic aneurysm.  CT ANGIOGRAPHY CHEST  Technique:  Multidetector CT imaging of the chest using the standard protocol during bolus administration of intravenous contrast. Multiplanar reconstructed images including MIPs were obtained and reviewed to evaluate the vascular anatomy.  Contrast: 133m OMNIPAQUE IOHEXOL  300 MG/ML  SOLN  Comparison: 08/13/2011  Findings: Again noted is the mild aneurysmal dilatation of the ascending aorta, currently measuring maximally 4.7 cm compared 5.0 cm previously.  No dissection or significant change.  Proximal descending thoracic aorta measures maximally 3.1 cm.  Descending thoracic aorta at the aortic hiatus measures 2.8 cm.  Distal thoracic aortic calcifications noted.  Left-sided pacer remains in place, unchanged.  Coronary artery calcifications present. No mediastinal, hilar, or axillary adenopathy.  Linear scarring in the lung bases.  Lungs otherwise clear.  No pleural effusions.  No suspicious pulmonary nodules or masses.  Visualized chest wall soft tissues are unremarkable.  Imaging into the upper abdomen demonstrates no acute findings.  No acute bony abnormality.  Mild degenerative changes and kyphosis in the thoracic spine.  IMPRESSION: Stable ascending aortic aneurysm, measuring maximally 4.7 cm.  Coronary artery disease.  Original Report Authenticated By: KRaelyn Number M.D.    Recent Lab Findings: Lab Results  Component Value Date   WBC 7.6 09/14/2019   HGB 12.6 09/14/2019   HCT 37.5 09/14/2019   PLT 177 09/14/2019   GLUCOSE 84 11/26/2019   CHOL 178 10/28/2017   TRIG 41 10/28/2017   HDL 71 10/28/2017   LDLCALC 99 10/28/2017   ALT 14 10/27/2017   AST 19 10/27/2017   NA 141 11/26/2019   K 4.0 11/26/2019   CL 106 11/26/2019   CREATININE 0.92 11/26/2019   BUN 17 11/26/2019   CO2 24 11/26/2019   TSH 1.660 09/14/2019   INR 1.29 10/27/2017   HGBA1C 5.5 03/27/2015   Aortic Size Index=     4.8    /Body surface area is 1.74 meters squared. =2.54  < 2.75 cm/m2      4% risk per year 2.75 to 4.25          8% risk per year > 4.25 cm/m2    20% risk per year     Assessment / Plan:  1/  ascending aortic aneurysm (which measures up to 4.8cm)  stable since 2012     2/descending thoracic aortic ectasia 3/Thoracic kyphosis and spondylosis 4/ history of afib on  coumadin    I reviewed the patient's CT scan and discussed with her a dilated ascending aorta at 4.8 cm .  Basically unchanged from previous scan.  The patient was not particularly interested in a major surgical procedure but would like to continue to follow the size of her aneurysm as she says" I do not want it to surprise me".  We will plan to see her back in 1 year with a CTA of the chest   Grace Isaac MD  Beeper [phone] Office [phone] 01/13/2020 2:42 PM

## 2020-01-19 DIAGNOSIS — S62102D Fracture of unspecified carpal bone, left wrist, subsequent encounter for fracture with routine healing: Secondary | ICD-10-CM | POA: Diagnosis not present

## 2020-01-19 DIAGNOSIS — S62101D Fracture of unspecified carpal bone, right wrist, subsequent encounter for fracture with routine healing: Secondary | ICD-10-CM | POA: Diagnosis not present

## 2020-01-19 DIAGNOSIS — I051 Rheumatic mitral insufficiency: Secondary | ICD-10-CM | POA: Diagnosis not present

## 2020-01-19 DIAGNOSIS — I714 Abdominal aortic aneurysm, without rupture: Secondary | ICD-10-CM | POA: Diagnosis not present

## 2020-01-19 DIAGNOSIS — K7581 Nonalcoholic steatohepatitis (NASH): Secondary | ICD-10-CM | POA: Diagnosis not present

## 2020-01-19 DIAGNOSIS — I5030 Unspecified diastolic (congestive) heart failure: Secondary | ICD-10-CM | POA: Diagnosis not present

## 2020-01-19 DIAGNOSIS — I2510 Atherosclerotic heart disease of native coronary artery without angina pectoris: Secondary | ICD-10-CM | POA: Diagnosis not present

## 2020-01-19 DIAGNOSIS — I110 Hypertensive heart disease with heart failure: Secondary | ICD-10-CM | POA: Diagnosis not present

## 2020-01-19 DIAGNOSIS — I480 Paroxysmal atrial fibrillation: Secondary | ICD-10-CM | POA: Diagnosis not present

## 2020-01-21 DIAGNOSIS — S62101D Fracture of unspecified carpal bone, right wrist, subsequent encounter for fracture with routine healing: Secondary | ICD-10-CM | POA: Diagnosis not present

## 2020-01-21 DIAGNOSIS — I2510 Atherosclerotic heart disease of native coronary artery without angina pectoris: Secondary | ICD-10-CM | POA: Diagnosis not present

## 2020-01-21 DIAGNOSIS — I5030 Unspecified diastolic (congestive) heart failure: Secondary | ICD-10-CM | POA: Diagnosis not present

## 2020-01-21 DIAGNOSIS — I110 Hypertensive heart disease with heart failure: Secondary | ICD-10-CM | POA: Diagnosis not present

## 2020-01-21 DIAGNOSIS — I480 Paroxysmal atrial fibrillation: Secondary | ICD-10-CM | POA: Diagnosis not present

## 2020-01-21 DIAGNOSIS — I051 Rheumatic mitral insufficiency: Secondary | ICD-10-CM | POA: Diagnosis not present

## 2020-01-21 DIAGNOSIS — S62102D Fracture of unspecified carpal bone, left wrist, subsequent encounter for fracture with routine healing: Secondary | ICD-10-CM | POA: Diagnosis not present

## 2020-01-21 DIAGNOSIS — K7581 Nonalcoholic steatohepatitis (NASH): Secondary | ICD-10-CM | POA: Diagnosis not present

## 2020-01-21 DIAGNOSIS — I714 Abdominal aortic aneurysm, without rupture: Secondary | ICD-10-CM | POA: Diagnosis not present

## 2020-01-24 DIAGNOSIS — S62102D Fracture of unspecified carpal bone, left wrist, subsequent encounter for fracture with routine healing: Secondary | ICD-10-CM | POA: Diagnosis not present

## 2020-01-24 DIAGNOSIS — I480 Paroxysmal atrial fibrillation: Secondary | ICD-10-CM | POA: Diagnosis not present

## 2020-01-24 DIAGNOSIS — S62101D Fracture of unspecified carpal bone, right wrist, subsequent encounter for fracture with routine healing: Secondary | ICD-10-CM | POA: Diagnosis not present

## 2020-01-24 DIAGNOSIS — K7581 Nonalcoholic steatohepatitis (NASH): Secondary | ICD-10-CM | POA: Diagnosis not present

## 2020-01-24 DIAGNOSIS — I714 Abdominal aortic aneurysm, without rupture: Secondary | ICD-10-CM | POA: Diagnosis not present

## 2020-01-24 DIAGNOSIS — I110 Hypertensive heart disease with heart failure: Secondary | ICD-10-CM | POA: Diagnosis not present

## 2020-01-24 DIAGNOSIS — I2510 Atherosclerotic heart disease of native coronary artery without angina pectoris: Secondary | ICD-10-CM | POA: Diagnosis not present

## 2020-01-24 DIAGNOSIS — I051 Rheumatic mitral insufficiency: Secondary | ICD-10-CM | POA: Diagnosis not present

## 2020-01-24 DIAGNOSIS — I5030 Unspecified diastolic (congestive) heart failure: Secondary | ICD-10-CM | POA: Diagnosis not present

## 2020-01-27 DIAGNOSIS — K7581 Nonalcoholic steatohepatitis (NASH): Secondary | ICD-10-CM | POA: Diagnosis not present

## 2020-01-27 DIAGNOSIS — I2510 Atherosclerotic heart disease of native coronary artery without angina pectoris: Secondary | ICD-10-CM | POA: Diagnosis not present

## 2020-01-27 DIAGNOSIS — S62101D Fracture of unspecified carpal bone, right wrist, subsequent encounter for fracture with routine healing: Secondary | ICD-10-CM | POA: Diagnosis not present

## 2020-01-27 DIAGNOSIS — I5030 Unspecified diastolic (congestive) heart failure: Secondary | ICD-10-CM | POA: Diagnosis not present

## 2020-01-27 DIAGNOSIS — I110 Hypertensive heart disease with heart failure: Secondary | ICD-10-CM | POA: Diagnosis not present

## 2020-01-27 DIAGNOSIS — I051 Rheumatic mitral insufficiency: Secondary | ICD-10-CM | POA: Diagnosis not present

## 2020-01-27 DIAGNOSIS — I480 Paroxysmal atrial fibrillation: Secondary | ICD-10-CM | POA: Diagnosis not present

## 2020-01-27 DIAGNOSIS — I714 Abdominal aortic aneurysm, without rupture: Secondary | ICD-10-CM | POA: Diagnosis not present

## 2020-01-27 DIAGNOSIS — S62102D Fracture of unspecified carpal bone, left wrist, subsequent encounter for fracture with routine healing: Secondary | ICD-10-CM | POA: Diagnosis not present

## 2020-01-28 ENCOUNTER — Ambulatory Visit (INDEPENDENT_AMBULATORY_CARE_PROVIDER_SITE_OTHER): Payer: Medicare PPO | Admitting: *Deleted

## 2020-01-28 DIAGNOSIS — K7581 Nonalcoholic steatohepatitis (NASH): Secondary | ICD-10-CM | POA: Diagnosis not present

## 2020-01-28 DIAGNOSIS — S62102D Fracture of unspecified carpal bone, left wrist, subsequent encounter for fracture with routine healing: Secondary | ICD-10-CM | POA: Diagnosis not present

## 2020-01-28 DIAGNOSIS — I480 Paroxysmal atrial fibrillation: Secondary | ICD-10-CM | POA: Diagnosis not present

## 2020-01-28 DIAGNOSIS — I5032 Chronic diastolic (congestive) heart failure: Secondary | ICD-10-CM | POA: Diagnosis not present

## 2020-01-28 DIAGNOSIS — I2510 Atherosclerotic heart disease of native coronary artery without angina pectoris: Secondary | ICD-10-CM | POA: Diagnosis not present

## 2020-01-28 DIAGNOSIS — I5030 Unspecified diastolic (congestive) heart failure: Secondary | ICD-10-CM | POA: Diagnosis not present

## 2020-01-28 DIAGNOSIS — I714 Abdominal aortic aneurysm, without rupture: Secondary | ICD-10-CM | POA: Diagnosis not present

## 2020-01-28 DIAGNOSIS — I051 Rheumatic mitral insufficiency: Secondary | ICD-10-CM | POA: Diagnosis not present

## 2020-01-28 DIAGNOSIS — I110 Hypertensive heart disease with heart failure: Secondary | ICD-10-CM | POA: Diagnosis not present

## 2020-01-28 DIAGNOSIS — S62101D Fracture of unspecified carpal bone, right wrist, subsequent encounter for fracture with routine healing: Secondary | ICD-10-CM | POA: Diagnosis not present

## 2020-01-30 LAB — CUP PACEART REMOTE DEVICE CHECK
Battery Impedance: 1025 Ohm
Battery Remaining Longevity: 70 mo
Battery Voltage: 2.78 V
Brady Statistic RV Percent Paced: 33 %
Date Time Interrogation Session: 20210716133310
Implantable Lead Implant Date: 20130114
Implantable Lead Implant Date: 20130114
Implantable Lead Location: 753859
Implantable Lead Location: 753860
Implantable Lead Model: 1948
Implantable Lead Model: 5076
Implantable Pulse Generator Implant Date: 20130114
Lead Channel Impedance Value: 657 Ohm
Lead Channel Impedance Value: 67 Ohm
Lead Channel Pacing Threshold Amplitude: 0.375 V
Lead Channel Pacing Threshold Pulse Width: 0.4 ms
Lead Channel Setting Pacing Amplitude: 2.5 V
Lead Channel Setting Pacing Pulse Width: 0.46 ms
Lead Channel Setting Sensing Sensitivity: 5.6 mV

## 2020-01-31 DIAGNOSIS — M25532 Pain in left wrist: Secondary | ICD-10-CM | POA: Diagnosis not present

## 2020-01-31 DIAGNOSIS — M25531 Pain in right wrist: Secondary | ICD-10-CM | POA: Diagnosis not present

## 2020-01-31 DIAGNOSIS — S52501D Unspecified fracture of the lower end of right radius, subsequent encounter for closed fracture with routine healing: Secondary | ICD-10-CM | POA: Diagnosis not present

## 2020-01-31 NOTE — Progress Notes (Signed)
Remote pacemaker transmission.   

## 2020-02-02 DIAGNOSIS — I480 Paroxysmal atrial fibrillation: Secondary | ICD-10-CM | POA: Diagnosis not present

## 2020-02-02 DIAGNOSIS — I110 Hypertensive heart disease with heart failure: Secondary | ICD-10-CM | POA: Diagnosis not present

## 2020-02-02 DIAGNOSIS — S62101D Fracture of unspecified carpal bone, right wrist, subsequent encounter for fracture with routine healing: Secondary | ICD-10-CM | POA: Diagnosis not present

## 2020-02-02 DIAGNOSIS — I714 Abdominal aortic aneurysm, without rupture: Secondary | ICD-10-CM | POA: Diagnosis not present

## 2020-02-02 DIAGNOSIS — I5030 Unspecified diastolic (congestive) heart failure: Secondary | ICD-10-CM | POA: Diagnosis not present

## 2020-02-02 DIAGNOSIS — I051 Rheumatic mitral insufficiency: Secondary | ICD-10-CM | POA: Diagnosis not present

## 2020-02-02 DIAGNOSIS — I2510 Atherosclerotic heart disease of native coronary artery without angina pectoris: Secondary | ICD-10-CM | POA: Diagnosis not present

## 2020-02-02 DIAGNOSIS — S62102D Fracture of unspecified carpal bone, left wrist, subsequent encounter for fracture with routine healing: Secondary | ICD-10-CM | POA: Diagnosis not present

## 2020-02-02 DIAGNOSIS — K7581 Nonalcoholic steatohepatitis (NASH): Secondary | ICD-10-CM | POA: Diagnosis not present

## 2020-02-07 DIAGNOSIS — I5030 Unspecified diastolic (congestive) heart failure: Secondary | ICD-10-CM | POA: Diagnosis not present

## 2020-02-07 DIAGNOSIS — I2510 Atherosclerotic heart disease of native coronary artery without angina pectoris: Secondary | ICD-10-CM | POA: Diagnosis not present

## 2020-02-07 DIAGNOSIS — I051 Rheumatic mitral insufficiency: Secondary | ICD-10-CM | POA: Diagnosis not present

## 2020-02-07 DIAGNOSIS — S62101D Fracture of unspecified carpal bone, right wrist, subsequent encounter for fracture with routine healing: Secondary | ICD-10-CM | POA: Diagnosis not present

## 2020-02-07 DIAGNOSIS — I110 Hypertensive heart disease with heart failure: Secondary | ICD-10-CM | POA: Diagnosis not present

## 2020-02-07 DIAGNOSIS — S62102D Fracture of unspecified carpal bone, left wrist, subsequent encounter for fracture with routine healing: Secondary | ICD-10-CM | POA: Diagnosis not present

## 2020-02-07 DIAGNOSIS — I480 Paroxysmal atrial fibrillation: Secondary | ICD-10-CM | POA: Diagnosis not present

## 2020-02-07 DIAGNOSIS — I714 Abdominal aortic aneurysm, without rupture: Secondary | ICD-10-CM | POA: Diagnosis not present

## 2020-02-07 DIAGNOSIS — K7581 Nonalcoholic steatohepatitis (NASH): Secondary | ICD-10-CM | POA: Diagnosis not present

## 2020-02-14 ENCOUNTER — Other Ambulatory Visit: Payer: Self-pay | Admitting: Internal Medicine

## 2020-02-16 DIAGNOSIS — I110 Hypertensive heart disease with heart failure: Secondary | ICD-10-CM | POA: Diagnosis not present

## 2020-02-16 DIAGNOSIS — I480 Paroxysmal atrial fibrillation: Secondary | ICD-10-CM | POA: Diagnosis not present

## 2020-02-16 DIAGNOSIS — S62102D Fracture of unspecified carpal bone, left wrist, subsequent encounter for fracture with routine healing: Secondary | ICD-10-CM | POA: Diagnosis not present

## 2020-02-16 DIAGNOSIS — S62101D Fracture of unspecified carpal bone, right wrist, subsequent encounter for fracture with routine healing: Secondary | ICD-10-CM | POA: Diagnosis not present

## 2020-02-16 DIAGNOSIS — I714 Abdominal aortic aneurysm, without rupture: Secondary | ICD-10-CM | POA: Diagnosis not present

## 2020-02-16 DIAGNOSIS — I5030 Unspecified diastolic (congestive) heart failure: Secondary | ICD-10-CM | POA: Diagnosis not present

## 2020-02-16 DIAGNOSIS — I2510 Atherosclerotic heart disease of native coronary artery without angina pectoris: Secondary | ICD-10-CM | POA: Diagnosis not present

## 2020-02-16 DIAGNOSIS — K7581 Nonalcoholic steatohepatitis (NASH): Secondary | ICD-10-CM | POA: Diagnosis not present

## 2020-02-16 DIAGNOSIS — I051 Rheumatic mitral insufficiency: Secondary | ICD-10-CM | POA: Diagnosis not present

## 2020-02-16 MED ORDER — DILTIAZEM HCL ER COATED BEADS 120 MG PO CP24: 120.0000 mg | ORAL_CAPSULE | Freq: Two times a day (BID) | ORAL | 2 refills | Status: DC

## 2020-02-21 ENCOUNTER — Other Ambulatory Visit: Payer: Self-pay

## 2020-02-21 ENCOUNTER — Inpatient Hospital Stay (HOSPITAL_COMMUNITY)
Admission: EM | Admit: 2020-02-21 | Discharge: 2020-02-25 | DRG: 394 | Disposition: A | Discharge status: Home / Self Care | Payer: Medicare PPO | Attending: Family Medicine | Admitting: Family Medicine

## 2020-02-21 ENCOUNTER — Encounter (HOSPITAL_COMMUNITY): Payer: Self-pay

## 2020-02-21 DIAGNOSIS — E039 Hypothyroidism, unspecified: Secondary | ICD-10-CM | POA: Diagnosis present

## 2020-02-21 DIAGNOSIS — Z7901 Long term (current) use of anticoagulants: Secondary | ICD-10-CM

## 2020-02-21 DIAGNOSIS — I5032 Chronic diastolic (congestive) heart failure: Secondary | ICD-10-CM | POA: Diagnosis present

## 2020-02-21 DIAGNOSIS — K7581 Nonalcoholic steatohepatitis (NASH): Secondary | ICD-10-CM | POA: Diagnosis present

## 2020-02-21 DIAGNOSIS — D62 Acute posthemorrhagic anemia: Secondary | ICD-10-CM | POA: Diagnosis present

## 2020-02-21 DIAGNOSIS — K922 Gastrointestinal hemorrhage, unspecified: Secondary | ICD-10-CM | POA: Diagnosis present

## 2020-02-21 DIAGNOSIS — Z20822 Contact with and (suspected) exposure to covid-19: Secondary | ICD-10-CM | POA: Diagnosis present

## 2020-02-21 DIAGNOSIS — D124 Benign neoplasm of descending colon: Secondary | ICD-10-CM | POA: Diagnosis not present

## 2020-02-21 DIAGNOSIS — Z8673 Personal history of transient ischemic attack (TIA), and cerebral infarction without residual deficits: Secondary | ICD-10-CM | POA: Diagnosis not present

## 2020-02-21 DIAGNOSIS — Z8249 Family history of ischemic heart disease and other diseases of the circulatory system: Secondary | ICD-10-CM

## 2020-02-21 DIAGNOSIS — Z79890 Hormone replacement therapy (postmenopausal): Secondary | ICD-10-CM

## 2020-02-21 DIAGNOSIS — K921 Melena: Secondary | ICD-10-CM | POA: Diagnosis present

## 2020-02-21 DIAGNOSIS — K625 Hemorrhage of anus and rectum: Secondary | ICD-10-CM | POA: Diagnosis not present

## 2020-02-21 DIAGNOSIS — D123 Benign neoplasm of transverse colon: Secondary | ICD-10-CM | POA: Diagnosis not present

## 2020-02-21 DIAGNOSIS — I110 Hypertensive heart disease with heart failure: Secondary | ICD-10-CM | POA: Diagnosis present

## 2020-02-21 DIAGNOSIS — Z87891 Personal history of nicotine dependence: Secondary | ICD-10-CM

## 2020-02-21 DIAGNOSIS — M353 Polymyalgia rheumatica: Secondary | ICD-10-CM | POA: Diagnosis present

## 2020-02-21 DIAGNOSIS — Z880 Allergy status to penicillin: Secondary | ICD-10-CM

## 2020-02-21 DIAGNOSIS — Z888 Allergy status to other drugs, medicaments and biological substances status: Secondary | ICD-10-CM

## 2020-02-21 DIAGNOSIS — E785 Hyperlipidemia, unspecified: Secondary | ICD-10-CM | POA: Diagnosis present

## 2020-02-21 DIAGNOSIS — I2510 Atherosclerotic heart disease of native coronary artery without angina pectoris: Secondary | ICD-10-CM | POA: Diagnosis present

## 2020-02-21 DIAGNOSIS — R58 Hemorrhage, not elsewhere classified: Secondary | ICD-10-CM | POA: Diagnosis not present

## 2020-02-21 DIAGNOSIS — I4821 Permanent atrial fibrillation: Secondary | ICD-10-CM | POA: Diagnosis present

## 2020-02-21 DIAGNOSIS — I712 Thoracic aortic aneurysm, without rupture: Secondary | ICD-10-CM | POA: Diagnosis present

## 2020-02-21 DIAGNOSIS — Z79899 Other long term (current) drug therapy: Secondary | ICD-10-CM

## 2020-02-21 DIAGNOSIS — Z885 Allergy status to narcotic agent status: Secondary | ICD-10-CM

## 2020-02-21 DIAGNOSIS — Z886 Allergy status to analgesic agent status: Secondary | ICD-10-CM | POA: Diagnosis not present

## 2020-02-21 DIAGNOSIS — K5730 Diverticulosis of large intestine without perforation or abscess without bleeding: Secondary | ICD-10-CM | POA: Diagnosis present

## 2020-02-21 DIAGNOSIS — Z66 Do not resuscitate: Secondary | ICD-10-CM | POA: Diagnosis present

## 2020-02-21 DIAGNOSIS — K635 Polyp of colon: Secondary | ICD-10-CM | POA: Diagnosis not present

## 2020-02-21 DIAGNOSIS — K644 Residual hemorrhoidal skin tags: Secondary | ICD-10-CM | POA: Diagnosis present

## 2020-02-21 DIAGNOSIS — D649 Anemia, unspecified: Secondary | ICD-10-CM

## 2020-02-21 DIAGNOSIS — K648 Other hemorrhoids: Principal | ICD-10-CM | POA: Diagnosis present

## 2020-02-21 DIAGNOSIS — I4891 Unspecified atrial fibrillation: Secondary | ICD-10-CM | POA: Diagnosis present

## 2020-02-21 DIAGNOSIS — I480 Paroxysmal atrial fibrillation: Secondary | ICD-10-CM

## 2020-02-21 DIAGNOSIS — D122 Benign neoplasm of ascending colon: Secondary | ICD-10-CM | POA: Diagnosis not present

## 2020-02-21 DIAGNOSIS — I071 Rheumatic tricuspid insufficiency: Secondary | ICD-10-CM | POA: Diagnosis present

## 2020-02-21 DIAGNOSIS — Z950 Presence of cardiac pacemaker: Secondary | ICD-10-CM

## 2020-02-21 DIAGNOSIS — D500 Iron deficiency anemia secondary to blood loss (chronic): Secondary | ICD-10-CM | POA: Diagnosis not present

## 2020-02-21 DIAGNOSIS — K641 Second degree hemorrhoids: Secondary | ICD-10-CM | POA: Diagnosis not present

## 2020-02-21 LAB — CBC
HCT: 28.6 % — ABNORMAL LOW (ref 36.0–46.0)
Hemoglobin: 9.1 g/dL — ABNORMAL LOW (ref 12.0–15.0)
MCH: 30.1 pg (ref 26.0–34.0)
MCHC: 31.8 g/dL (ref 30.0–36.0)
MCV: 94.7 fL (ref 80.0–100.0)
Platelets: 263 10*3/uL (ref 150–400)
RBC: 3.02 MIL/uL — ABNORMAL LOW (ref 3.87–5.11)
RDW: 14.6 % (ref 11.5–15.5)
WBC: 6.6 10*3/uL (ref 4.0–10.5)
nRBC: 0 % (ref 0.0–0.2)

## 2020-02-21 LAB — COMPREHENSIVE METABOLIC PANEL
ALT: 13 U/L (ref 0–44)
AST: 16 U/L (ref 15–41)
Albumin: 3.4 g/dL — ABNORMAL LOW (ref 3.5–5.0)
Alkaline Phosphatase: 52 U/L (ref 38–126)
Anion gap: 8 (ref 5–15)
BUN: 15 mg/dL (ref 8–23)
CO2: 24 mmol/L (ref 22–32)
Calcium: 9.5 mg/dL (ref 8.9–10.3)
Chloride: 107 mmol/L (ref 98–111)
Creatinine, Ser: 0.97 mg/dL (ref 0.44–1.00)
GFR calc Af Amer: >60 mL/min (ref >60)
GFR calc non Af Amer: 53 mL/min — ABNORMAL LOW (ref >60)
Glucose, Bld: 101 mg/dL — ABNORMAL HIGH (ref 70–99)
Potassium: 3.6 mmol/L (ref 3.5–5.1)
Sodium: 139 mmol/L (ref 135–145)
Total Bilirubin: 0.6 mg/dL (ref 0.3–1.2)
Total Protein: 5.7 g/dL — ABNORMAL LOW (ref 6.5–8.1)

## 2020-02-21 LAB — SARS CORONAVIRUS 2 BY RT PCR (HOSPITAL ORDER, PERFORMED IN ~~LOC~~ HOSPITAL LAB): SARS Coronavirus 2: NEGATIVE

## 2020-02-21 LAB — POC OCCULT BLOOD, ED: Fecal Occult Bld: POSITIVE — AB

## 2020-02-21 MED ORDER — LABETALOL HCL 200 MG PO TABS
300.0000 mg | ORAL_TABLET | Freq: Every day | ORAL | Status: DC
  Administered 2020-02-22 – 2020-02-25 (×4): 300 mg via ORAL
  Filled 2020-02-21 (×4): qty 1

## 2020-02-21 MED ORDER — SODIUM CHLORIDE 0.9% FLUSH
3.0000 mL | INTRAVENOUS | Status: DC | PRN
  Administered 2020-02-22: 3 mL via INTRAVENOUS

## 2020-02-21 MED ORDER — DILTIAZEM HCL ER COATED BEADS 120 MG PO CP24
120.0000 mg | ORAL_CAPSULE | Freq: Two times a day (BID) | ORAL | Status: DC
  Administered 2020-02-22 – 2020-02-25 (×7): 120 mg via ORAL
  Filled 2020-02-21 (×8): qty 1

## 2020-02-21 MED ORDER — SODIUM CHLORIDE 0.9% FLUSH
3.0000 mL | Freq: Two times a day (BID) | INTRAVENOUS | Status: DC
  Administered 2020-02-22 – 2020-02-24 (×5): 3 mL via INTRAVENOUS

## 2020-02-21 MED ORDER — ONDANSETRON HCL 4 MG PO TABS: 4.0000 mg | ORAL_TABLET | Freq: Four times a day (QID) | ORAL | Status: DC | PRN

## 2020-02-21 MED ORDER — ACETAMINOPHEN 650 MG RE SUPP: 650.0000 mg | Freq: Four times a day (QID) | RECTAL | Status: DC | PRN

## 2020-02-21 MED ORDER — LABETALOL HCL 200 MG PO TABS
450.0000 mg | ORAL_TABLET | Freq: Every day | ORAL | Status: DC
  Administered 2020-02-22 – 2020-02-24 (×4): 450 mg via ORAL
  Filled 2020-02-21 (×3): qty 2

## 2020-02-21 MED ORDER — LOSARTAN POTASSIUM 25 MG PO TABS: 25.0000 mg | ORAL_TABLET | Freq: Every day | ORAL | Status: DC

## 2020-02-21 MED ORDER — ONDANSETRON HCL 4 MG/2ML IJ SOLN: 4.0000 mg | Freq: Four times a day (QID) | INTRAMUSCULAR | Status: DC | PRN

## 2020-02-21 MED ORDER — ACETAMINOPHEN 325 MG PO TABS: 650.0000 mg | ORAL_TABLET | Freq: Four times a day (QID) | ORAL | Status: DC | PRN

## 2020-02-21 MED ORDER — SODIUM CHLORIDE 0.9 % IV SOLN: 250.0000 mL | INTRAVENOUS | Status: DC | PRN

## 2020-02-21 MED ORDER — LEVOTHYROXINE SODIUM 25 MCG PO TABS
25.0000 ug | ORAL_TABLET | Freq: Every day | ORAL | Status: DC
  Administered 2020-02-22 – 2020-02-25 (×4): 25 ug via ORAL
  Filled 2020-02-21 (×4): qty 1

## 2020-02-21 MED ORDER — SODIUM CHLORIDE 0.9% FLUSH
3.0000 mL | Freq: Two times a day (BID) | INTRAVENOUS | Status: DC
  Administered 2020-02-22 – 2020-02-24 (×4): 3 mL via INTRAVENOUS

## 2020-02-21 NOTE — Progress Notes (Signed)
GI PRELIMINARY NOTE--  We have been asked to see this patient because of GI bleeding.  We will plan to see her in the morning, but call me tonight if earlier input is needed.   Cleotis Nipper, M.D. Pager [phone] If no answer or after 5 PM call [phone]

## 2020-02-21 NOTE — ED Triage Notes (Signed)
Per EMS- patient c/o rectal bleeding. Patient reports a history of hemorrhoids and states this is much worse than her usual hemorrhoid bleeding. patient states that she has had 3 pad changes of bright red blood today. patient takes Eliquis.

## 2020-02-21 NOTE — H&P (Signed)
History and Physical    [name] [name] AST:[id number] DOB: Sep 02, 1930 DOA: 02/21/2020  PCP: [name], MD   Patient coming from: Home   Chief Complaint: Rectal bleeding   HPI: [name] [name] is a 86 y.o. female with medical history significant for atrial fibrillation on Eliquis, chronic diastolic CHF, hypothyroidism, history of CVA, and hypertension, now presenting to emergency department with rectal bleeding.  The patient reports 1 day of rectal bleeding without any abdominal pain or nausea, similar to her history of hemorrhoidal bleeding but heavier than usual.  Patient reports seeing red and maroon blood in her undergarments, has been attempting to control the bleeding with compression which has been successful with her prior hemorrhoidal bleeding, but she continues to have bleeding, prompting her presentation to the ED.  She denies any abdominal pain, denies nausea or vomiting, and denies any chest pain or lightheadedness.  She did not take her Eliquis this morning or this evening.  ED Course: Upon arrival to the ED, patient is found to be afebrile, saturating well, and with stable blood pressure.  Chemistry panel is unremarkable and CBC notable for a hemoglobin of 9.1, down from 12.6 in March 2021.  Fecal occult blood testing is positive.  COVID-19 screening test not yet resulted.  Gastroenterology was consulted by the ED physician.  Hospitalist asked to admit.  Review of Systems:  All other systems reviewed and apart from HPI, are negative.  Past Medical History:  Diagnosis Date  . CAD (coronary artery disease)    a. LHC 10/2017 30% RCA otherwise OK.  . Chronic diastolic CHF (congestive heart failure) ([place])   . Hemorrhoids    current problem as of 04/26/16  with slight bleeding of hemorrhoids   . Hepatitis    hx of medication induced hepatitis (Vytorin per pt)  . History of kidney stones   . HLD (hyperlipidemia)   . HTN (hypertension)   . Hx of echocardiogram    echo 1/13: EF  55%, Gr 2 diast dysfn, Asc Ao aneurysm 4.8 cm, mild MR, mod LAE, mild RAE  . Hypothyroidism   . ICH (intracerebral hemorrhage) ([place]) 03/24/15  . Lower extremity edema   . NASH (nonalcoholic steatohepatitis)   . Neuromuscular disorder ([place])   . Pacemaker-Medtronic 11/12/2011   Implanted 2013   . Permanent atrial fibrillation ([place])   . PMR (polymyalgia rheumatica) ([facility])    no steriods for 5 years  . sinus node dysfunction//post termination pauses   . Stroke ([place])   . Thoracic aortic aneurysm (TAA) ([place])    a. followed by Dr. Servando Snare.  . Tricuspid regurgitation   . Urinary tract infection    Recurrent infections.     Past Surgical History:  Procedure Laterality Date  . CARDIAC CATHETERIZATION    . CARDIOVERSION  03/02/2012   Procedure: CARDIOVERSION;  Surgeon: Lelon Perla, MD;  Location: [facility] ENDOSCOPY;  Service: Cardiovascular;  Laterality: N/A;  . CARDIOVERSION  03/13/2012   Procedure: CARDIOVERSION;  Surgeon: Josue Hector, MD;  Location: [facility] ENDOSCOPY;  Service: Cardiovascular;  Laterality: N/A;  . CARDIOVERSION  04/15/2012   Procedure: CARDIOVERSION;  Surgeon: [name], MD;  Location: [facility] ENDOSCOPY;  Service: Cardiovascular;  Laterality: N/A;  . CARDIOVERSION N/A 06/07/2014   Procedure: CARDIOVERSION;  Surgeon: [name], MD;  Location: [facility] ENDOSCOPY;  Service: Cardiovascular;  Laterality: N/A;  . CARDIOVERSION N/A 08/18/2014   Procedure: CARDIOVERSION;  Surgeon: [name], MD;  Location: [place];  Service: Cardiovascular;  Laterality: N/A;  .  CARDIOVERSION N/A 09/06/2015   Procedure: CARDIOVERSION;  Surgeon: Jerline Pain, MD;  Location: [place];  Service: Cardiovascular;  Laterality: N/A;  . CYSTOSCOPY WITH STENT PLACEMENT Right 04/22/2016   Procedure: CYSTOSCOPY WITH RIGHT URETERAL STENT INSERTION;  Surgeon: [name], MD;  Location: [place];  Service: Urology;  Laterality: Right;  . CYSTOSCOPY WITH URETEROSCOPY, STONE BASKETRY AND STENT PLACEMENT  Right 04/30/2016   Procedure: CYSTOSCOPY WITH URETEROSCOPY, STENT PLACEMENT REMOVAL;  Surgeon: [name], MD;  Location: [facility] ORS;  Service: Urology;  Laterality: Right;  . HERNIA REPAIR    . INCISION AND DRAINAGE BREAST ABSCESS  1973  . LEFT HEART CATH AND CORONARY ANGIOGRAPHY N/A 10/28/2017   Procedure: LEFT HEART CATH AND CORONARY ANGIOGRAPHY;  Surgeon: Lorretta Harp, MD;  Location: [place] CV LAB;  Service: Cardiovascular;  Laterality: N/A;  . PACEMAKER INSERTION  Jan 2013  . PERMANENT PACEMAKER INSERTION N/A 07/29/2011   Procedure: PERMANENT PACEMAKER INSERTION;  Surgeon: [name], MD;  Location: [facility] CATH LAB;  Service: Cardiovascular;  Laterality: N/A;  . UMBILICAL HERNIA REPAIR  1950's    Social History:   reports that she quit smoking about 29 years ago. Her smoking use included cigarettes. She has never used smokeless tobacco. She reports that she does not drink alcohol and does not use drugs.  Allergies  Allergen Reactions  . Penicillins Anaphylaxis    Has patient had a PCN reaction causing immediate rash, facial/tongue/throat swelling, SOB or lightheadedness with hypotension: Yes Has patient had a PCN reaction causing severe rash involving mucus membranes or skin necrosis: No Has patient had a PCN reaction that required hospitalization No Has patient had a PCN reaction occurring within the last 10 years: No If all of the above answers are "NO", then may proceed with Cephalosporin use.   . Statins Other (See Comments)    REACTION: elevated LFT's  . Tylenol [Acetaminophen] Other (See Comments)    If taken with Vytorin at risk for liver damage  . Adhesive [Tape] Other (See Comments)    Irritates skin  . Amiodarone Other (See Comments)  . Amiodarone Other (See Comments)    unk  . Codeine Other (See Comments)    Weird feeling  . Penicillins Swelling    Did it involve swelling of the face/tongue/throat, SOB, or low BP? Y Did it involve sudden or severe rash/hives,  skin peeling, or any reaction on the inside of your mouth or nose? N Did you need to seek medical attention at a hospital or doctor's office? N When did it last happen? Over 40 years ago If all above answers are "NO", may proceed with cephalosporin use.    . Quinolones   . Quinolones Other (See Comments)    UNk  . Statins Other (See Comments)    Liver enzymes elevated  . Tape Other (See Comments)    Redness, Please use "paper" tape only.  . Codeine Other (See Comments)    feel funny, head is fuzzy    Family History  Problem Relation Age of Onset  . Coronary artery disease Mother   . Coronary artery disease Other   . Alzheimer's disease Other   . Coronary artery disease Brother   . Coronary artery disease Brother      Prior to Admission medications   Medication Sig Start Date End Date Taking? Authorizing Provider  acetaminophen (TYLENOL) 500 MG tablet Take 1,000 mg by mouth every 6 (six) hours as needed for mild pain.   Yes  [provider]  Ascorbic Acid (VITAMIN C) 1000 MG tablet Take 500 mg by mouth once a week.    Yes [provider]  Calcium Carbonate-Vitamin D (CALTRATE 600+D PO) Take 1 tablet by mouth daily.    Yes [provider]  cetirizine (ZYRTEC) 10 MG tablet Take 10 mg by mouth daily.    Yes [provider]  Cholecalciferol (VITAMIN D3) 1000 UNITS tablet Take 1,000 Units by mouth daily.    Yes [provider]  colesevelam (WELCHOL) 625 MG tablet Take 625-1,250 mg by mouth See admin instructions. Taking 1 tablet in the AM and 2 tabs (1265m) in the evening 11/08/19  Yes [provider]  diltiazem (CARDIZEM CD) 120 MG 24 hr capsule Take 1 capsule (120 mg total) by mouth 2 (two) times daily. 02/16/20  Yes KDeboraha Sprang MD  ELIQUIS 5 MG TABS tablet TAKE 1 TABLET BY MOUTH TWICE DAILY 06/22/19  Yes KDeboraha Sprang MD  furosemide (LASIX) 40 MG tablet Take 60 mg by mouth daily. Take 1.5 tablets (60) mg daily   Yes  [provider]  labetalol (NORMODYNE) 300 MG tablet TAKE 1 TABLET BY MOUTH IN THE MORNING AND 1 & 1/2 TABLET IN THE EVENING. Please make yearly appt with Dr. KCaryl Comesfor May before anymore refills. 1st attempt 08/23/19  Yes KDeboraha Sprang MD  levothyroxine (SYNTHROID, LEVOTHROID) 25 MCG tablet Take 25 mcg by mouth daily before breakfast.   Yes [provider]  losartan (COZAAR) 25 MG tablet Take 25 mg by mouth daily.   Yes [provider]  spironolactone (ALDACTONE) 25 MG tablet Take 1 tablet (25 mg total) by mouth daily. 11/12/19  Yes TShirley Friar PA-C  furosemide (LASIX) 40 MG tablet Take 1.5 tablets (60 mg total) by mouth daily. 08/31/19 11/29/19  TShirley Friar PA-C  losartan (COZAAR) 25 MG tablet Take 1 tablet (25 mg total) by mouth daily. 09/14/19 12/13/19  TShirley Friar PA-C    Physical Exam: Vitals:   02/21/20 2108 02/21/20 2236 02/21/20 2300 02/21/20 2309  BP:  (!) 126/51  131/64  Pulse:  84  88  Resp:    20  Temp:    98.2 F (36.8 C)  TempSrc:  Oral  Oral  SpO2: 100% 100%  99%  Weight:   68.9 kg   Height:   5' 4"  (1.626 m)      Constitutional: NAD, calm  Eyes: PERTLA, lids and conjunctivae normal ENMT: Mucous membranes are moist. Posterior pharynx clear of any exudate or lesions.   Neck: normal, supple, no masses, no thyromegaly Respiratory: no wheezing, no crackles. No accessory muscle use.  Cardiovascular:  Rate ~80, irregularly irregular. Bilateral leg swelling. Abdomen: No distension, no tenderness, soft. Bowel sounds active.  Musculoskeletal: no clubbing / cyanosis. No joint deformity upper and lower extremities.   Skin: no significant rashes, lesions, ulcers. Warm, dry, well-perfused. Neurologic: CN 2-12 grossly intact. Sensation intact. Moving all extremities. Resting tremor.  Psychiatric: Alert and oriented to person, place, and situation. Calm and cooperative.    Labs and Imaging on Admission: I have  personally reviewed following labs and imaging studies  CBC: Recent Labs  Lab 02/21/20 1849  WBC 6.6  HGB 9.1*  HCT 28.6*  MCV 94.7  PLT 2659  Basic Metabolic Panel: Recent Labs  Lab 02/21/20 1849  NA 139  K 3.6  CL 107  CO2 24  GLUCOSE 101*  BUN 15  CREATININE 0.97  CALCIUM 9.5  GFR: Estimated Creatinine Clearance: 39.7 mL/min (by C-G formula based on SCr of 0.97 mg/dL). Liver Function Tests: Recent Labs  Lab 02/21/20 1849  AST 16  ALT 13  ALKPHOS 52  BILITOT 0.6  PROT 5.7*  ALBUMIN 3.4*   No results for input(s): LIPASE, AMYLASE in the last 168 hours. No results for input(s): AMMONIA in the last 168 hours. Coagulation Profile: No results for input(s): INR, PROTIME in the last 168 hours. Cardiac Enzymes: No results for input(s): CKTOTAL, CKMB, CKMBINDEX, TROPONINI in the last 168 hours. BNP (last 3 results) No results for input(s): PROBNP in the last 8760 hours. HbA1C: No results for input(s): HGBA1C in the last 72 hours. CBG: No results for input(s): GLUCAP in the last 168 hours. Lipid Profile: No results for input(s): CHOL, HDL, LDLCALC, TRIG, CHOLHDL, LDLDIRECT in the last 72 hours. Thyroid Function Tests: No results for input(s): TSH, T4TOTAL, FREET4, T3FREE, THYROIDAB in the last 72 hours. Anemia Panel: No results for input(s): VITAMINB12, FOLATE, FERRITIN, TIBC, IRON, RETICCTPCT in the last 72 hours. Urine analysis:    Component Value Date/Time   COLORURINE RED (A) 04/29/2016 0130   APPEARANCEUR TURBID (A) 04/29/2016 0130   LABSPEC 1.017 04/29/2016 0130   PHURINE 5.5 04/29/2016 0130   GLUCOSEU NEGATIVE 04/29/2016 0130   HGBUR LARGE (A) 04/29/2016 0130   BILIRUBINUR LARGE (A) 04/29/2016 0130   KETONESUR 15 (A) 04/29/2016 0130   PROTEINUR 100 (A) 04/29/2016 0130   UROBILINOGEN 0.2 01/14/2016 1337   NITRITE NEGATIVE 04/29/2016 0130   LEUKOCYTESUR MODERATE (A) 04/29/2016 0130   Sepsis  Labs: @LABRCNTIP (procalcitonin:4,lacticidven:4) ) Recent Results (from the past 240 hour(s))  SARS Coronavirus 2 by RT PCR (hospital order, performed in [facility] hospital lab) Nasopharyngeal Nasopharyngeal Swab     Status: None   Collection Time: 02/21/20  9:44 PM   Specimen: Nasopharyngeal Swab  Result Value Ref Range Status   SARS Coronavirus 2 NEGATIVE NEGATIVE Final    Comment: (NOTE) SARS-CoV-2 target nucleic acids are NOT DETECTED.  The SARS-CoV-2 RNA is generally detectable in upper and lower respiratory specimens during the acute phase of infection. The lowest concentration of SARS-CoV-2 viral copies this assay can detect is 250 copies / mL. A negative result does not preclude SARS-CoV-2 infection and should not be used as the sole basis for treatment or other patient management decisions.  A negative result may occur with improper specimen collection / handling, submission of specimen other than nasopharyngeal swab, presence of viral mutation(s) within the areas targeted by this assay, and inadequate number of viral copies (<250 copies / mL). A negative result must be combined with clinical observations, patient history, and epidemiological information.  Fact Sheet for Patients:   StrictlyIdeas.no  Fact Sheet for Healthcare Providers: BankingDealers.co.za  This test is not yet approved or  cleared by the [place] FDA and has been authorized for detection and/or diagnosis of SARS-CoV-2 by FDA under an Emergency Use Authorization (EUA).  This EUA will remain in effect (meaning this test can be used) for the duration of the COVID-19 declaration under Section 564(b)(1) of the Act, 21 U.S.C. section 360bbb-3(b)(1), unless the authorization is terminated or revoked sooner.  Performed at [facility], [place] [address], [place], [place] [address]      Radiological Exams on Admission: No results  found.  Assessment/Plan   1. Acute GI bleeding; anemia  - Presents with one day of rectal bleeding described as similar to her prior hemorrhoidal bleeding but heavier, found to  be FOBT+ with Hgb 9.1 (12.6 in March 2021), normal BP and HR  - GI consulting and much appreciated  - Continue bowel rest, hold Eliquis, type & screen, trend H&H, transfuse as needed    2. Atrial fibrillation  - CHADS-VASc at least 57 (age x2, CVA x2, gender, HTN)  - Hold Eliquis initially; not reversed as bleeding has slowed she is hemodynamically stable  - Continue diltiazem    3. Chronic diastolic dysfunction  - Appears compensated  - She has been bleeding and not eating/drinking much  - Hold diuretics initially, monitor daily wt and I/Os    4. Hypothyroidism  - Continue Synthroid    5. Hypertension  - BP at goal, continue losartan, diltiazem, and labetalol as tolerated     DVT prophylaxis: Eliquis pta, SCDs only for now  Code Status: DNR, confirmed with patient  Family Communication: Discussed with patient  Disposition Plan:  Patient is from: Home  Anticipated d/c is to: TBD Anticipated d/c date is: 8/10 or 02/23/20 Patient currently: Pending GI consultation, trending of H&H Consults called: GI  Admission status: Observation     Vianne Bulls, MD [facility]  02/21/2020, 11:20 PM

## 2020-02-21 NOTE — ED Provider Notes (Signed)
[facility] EMERGENCY DEPARTMENT Provider Note  CSN: [id number] Arrival date & time: 02/21/20 1821    History Chief Complaint  Patient presents with  . Rectal Bleeding    HPI  [name] [name] is a 86 y.o. female with GI bleeding. She ahs a history of afib on Eliquis. She has had bleeding hemorrhoids before, but has had persistent bright red blood per rectum since yesterday. Occasional stool, but difficult for her to tell if there is blood in the stool or not. She has not had any abdominal pain, no vomiting, no fever. She has not had any lightheadedness or dizziness.    Past Medical History:  Diagnosis Date  . CAD (coronary artery disease)    a. LHC 10/2017 30% RCA otherwise OK.  . Chronic diastolic CHF (congestive heart failure) ([place])   . Hemorrhoids    current problem as of 04/26/16  with slight bleeding of hemorrhoids   . Hepatitis    hx of medication induced hepatitis (Vytorin per pt)  . History of kidney stones   . HLD (hyperlipidemia)   . HTN (hypertension)   . Hx of echocardiogram    echo 1/13: EF 55%, Gr 2 diast dysfn, Asc Ao aneurysm 4.8 cm, mild MR, mod LAE, mild RAE  . Hypothyroidism   . ICH (intracerebral hemorrhage) ([place]) 03/24/15  . Lower extremity edema   . NASH (nonalcoholic steatohepatitis)   . Neuromuscular disorder ( [place])   . Pacemaker-Medtronic 11/12/2011   Implanted 2013   . Permanent atrial fibrillation ([place])   . PMR (polymyalgia rheumatica) (HCC)    no steriods for 5 years  . sinus node dysfunction//post termination pauses   . Stroke ([place])   . Thoracic aortic aneurysm (TAA) ([place])    a. followed by Dr. Servando Snare.  . Tricuspid regurgitation   . Urinary tract infection    Recurrent infections.     Past Surgical History:  Procedure Laterality Date  . CARDIAC CATHETERIZATION    . CARDIOVERSION  03/02/2012   Procedure: CARDIOVERSION;  Surgeon: Lelon Perla, MD;  Location: [facility] ENDOSCOPY;  Service: Cardiovascular;  Laterality: N/A;  . CARDIOVERSION   03/13/2012   Procedure: CARDIOVERSION;  Surgeon: Josue Hector, MD;  Location: [facility];  Service: Cardiovascular;  Laterality: N/A;  . CARDIOVERSION  04/15/2012   Procedure: CARDIOVERSION;  Surgeon: [name], MD;  Location: [facility];  Service: Cardiovascular;  Laterality: N/A;  . CARDIOVERSION N/A 06/07/2014   Procedure: CARDIOVERSION;  Surgeon: [name], MD;  Location: [facility] ENDOSCOPY;  Service: Cardiovascular;  Laterality: N/A;  . CARDIOVERSION N/A 08/18/2014   Procedure: CARDIOVERSION;  Surgeon: [name], MD;  Location: [place];  Service: Cardiovascular;  Laterality: N/A;  . CARDIOVERSION N/A 09/06/2015   Procedure: CARDIOVERSION;  Surgeon: Jerline Pain, MD;  Location: [facility];  Service: Cardiovascular;  Laterality: N/A;  . CYSTOSCOPY WITH STENT PLACEMENT Right 04/22/2016   Procedure: CYSTOSCOPY WITH RIGHT URETERAL STENT INSERTION;  Surgeon: [name], MD;  Location: [facility];  Service: Urology;  Laterality: Right;  . CYSTOSCOPY WITH URETEROSCOPY, STONE BASKETRY AND STENT PLACEMENT Right 04/30/2016   Procedure: CYSTOSCOPY WITH URETEROSCOPY, STENT PLACEMENT REMOVAL;  Surgeon: [name], MD;  [facility];  Service: Urology;  Laterality: Right;  . HERNIA REPAIR    . INCISION AND DRAINAGE BREAST ABSCESS  1973  . LEFT HEART CATH AND CORONARY ANGIOGRAPHY N/A 10/28/2017   Procedure: LEFT HEART CATH AND CORONARY ANGIOGRAPHY;  Surgeon: Lorretta Harp, MD;  Location: [place] [facility] CV  LAB;  Service: Cardiovascular;  Laterality: N/A;  . PACEMAKER INSERTION  Jan 2013  . PERMANENT PACEMAKER INSERTION N/A 07/29/2011   Procedure: PERMANENT PACEMAKER INSERTION;  Surgeon: [name], MD;  Location: [facility] CATH LAB;  Service: Cardiovascular;  Laterality: N/A;  . UMBILICAL HERNIA REPAIR  1950's    Family History  Problem Relation Age of Onset  . Coronary artery disease Mother   . Coronary artery disease Other   . Alzheimer's disease Other   . Coronary artery  disease Brother   . Coronary artery disease Brother     Social History   Tobacco Use  . Smoking status: Former Smoker    Types: Cigarettes    Quit date: 07/16/1990    Years since quitting: 29.6  . Smokeless tobacco: Never Used  Vaping Use  . Vaping Use: Never used  Substance Use Topics  . Alcohol use: No  . Drug use: No     Home Medications Prior to Admission medications   Medication Sig Start Date End Date Taking? Authorizing Provider  acetaminophen (TYLENOL) 500 MG tablet Take 1,000 mg by mouth every 6 (six) hours as needed for mild pain.   Yes [provider]  Ascorbic Acid (VITAMIN C) 1000 MG tablet Take 500 mg by mouth once a week.    Yes [provider]  Calcium Carbonate-Vitamin D (CALTRATE 600+D PO) Take 1 tablet by mouth daily.    Yes [provider]  cetirizine (ZYRTEC) 10 MG tablet Take 10 mg by mouth daily.    Yes [provider]  Cholecalciferol (VITAMIN D3) 1000 UNITS tablet Take 1,000 Units by mouth daily.    Yes [provider]  colesevelam (WELCHOL) 625 MG tablet Take 625-1,250 mg by mouth See admin instructions. Taking 1 tablet in the AM and 2 tabs (1253m) in the evening 11/08/19  Yes [provider]  diltiazem (CARDIZEM CD) 120 MG 24 hr capsule Take 1 capsule (120 mg total) by mouth 2 (two) times daily. 02/16/20  Yes KDeboraha Sprang MD  ELIQUIS 5 MG TABS tablet TAKE 1 TABLET BY MOUTH TWICE DAILY 06/22/19  Yes KDeboraha Sprang MD  furosemide (LASIX) 40 MG tablet Take 60 mg by mouth daily. Take 1.5 tablets (60) mg daily   Yes [provider]  labetalol (NORMODYNE) 300 MG tablet TAKE 1 TABLET BY MOUTH IN THE MORNING AND 1 & 1/2 TABLET IN THE EVENING. Please make yearly appt with Dr. KCaryl Comesfor May before anymore refills. 1st attempt 08/23/19  Yes KDeboraha Sprang MD  levothyroxine (SYNTHROID, LEVOTHROID) 25 MCG tablet Take 25 mcg by mouth daily before breakfast.   Yes [provider]  losartan (COZAAR)  25 MG tablet Take 25 mg by mouth daily.   Yes [provider]  spironolactone (ALDACTONE) 25 MG tablet Take 1 tablet (25 mg total) by mouth daily. 11/12/19  Yes TShirley Friar PA-C  furosemide (LASIX) 40 MG tablet Take 1.5 tablets (60 mg total) by mouth daily. 08/31/19 11/29/19  TShirley Friar PA-C  losartan (COZAAR) 25 MG tablet Take 1 tablet (25 mg total) by mouth daily. 09/14/19 12/13/19  TShirley Friar PA-C     Allergies    Penicillins, Statins, Tylenol [acetaminophen], Adhesive [tape], Amiodarone, Amiodarone, Codeine, Penicillins, Quinolones, Quinolones, Statins, Tape, and Codeine   Review of Systems   Review of Systems A comprehensive review of systems was completed and negative except as noted in HPI.    Physical Exam BP (!) 126/51 (BP Location:  Left Arm)   Pulse 84   Temp 98 F (36.7 C)   Resp 16   Ht 5' 4"  (1.626 m)   Wt 68.9 kg   SpO2 100%   BMI 26.09 kg/m   Physical Exam Vitals and nursing note reviewed.  Constitutional:      Appearance: Normal appearance.  HENT:     Head: Normocephalic and atraumatic.     Nose: Nose normal.     Mouth/Throat:     Mouth: Mucous membranes are moist.  Eyes:     Extraocular Movements: Extraocular movements intact.     Conjunctiva/sclera: Conjunctivae normal.  Cardiovascular:     Rate and Rhythm: Normal rate.  Pulmonary:     Effort: Pulmonary effort is normal.     Breath sounds: Normal breath sounds.  Abdominal:     General: Abdomen is flat.     Palpations: Abdomen is soft.     Tenderness: There is no abdominal tenderness.  Genitourinary:    Comments: Multiple non thrombosed external hemorrhoids, none appear to be actively bleeding, small amount of hard stool on digital rectal exam, bright red blood on finger Musculoskeletal:        General: No swelling. Normal range of motion.     Cervical back: Neck supple.  Skin:    General: Skin is warm and dry.  Neurological:     General: No focal  deficit present.     Mental Status: She is alert.  Psychiatric:        Mood and Affect: Mood normal.      ED Results / Procedures / Treatments   Labs (all labs ordered are listed, but only abnormal results are displayed) Labs Reviewed  COMPREHENSIVE METABOLIC PANEL - Abnormal; Notable for the following components:      Result Value   Glucose, Bld 101 (*)    Total Protein 5.7 (*)    Albumin 3.4 (*)    GFR calc non Af Amer 53 (*)    All other components within normal limits  CBC - Abnormal; Notable for the following components:   RBC 3.02 (*)    Hemoglobin 9.1 (*)    HCT 28.6 (*)    All other components within normal limits  POC OCCULT BLOOD, ED - Abnormal; Notable for the following components:   Fecal Occult Bld POSITIVE (*)    All other components within normal limits  SARS CORONAVIRUS 2 BY RT PCR (HOSPITAL ORDER, [facility] LAB)  TYPE AND SCREEN    EKG None  Radiology No results found.  Procedures Procedures  Medications Ordered in the ED Medications - No data to display   MDM Rules/Calculators/A&P MDM Patient with likely lower GI bleeding, may be hemorrhoidal vs higher. PAtient is on Eliquis. Hgb has dropped from recent values, but not in need of transfusion at this time. CMP is unremarkable. Will admit to Hospitalist for further evaluation.  ED Course  I have reviewed the triage vital signs and the nursing notes.  Pertinent labs & imaging results that were available during my care of the patient were reviewed by me and considered in my medical decision making (see chart for details).  Clinical Course as of Feb 20 2246  [facility] Feb 21, 2020  2115 Non-urgent GI consult placed for [facility] GI, Dr. Cristina Gong.    [CS]  2138 Spoke with Dr. Myna Hidalgo, Hospitalists, who will admit.    [CS]    Clinical Course User Index [CS] Truddie Hidden, MD  Final Clinical Impression(s) / ED Diagnoses Final diagnoses:  Lower GI bleed    Rx / DC  Orders ED Discharge Orders    None       Truddie Hidden, MD 02/21/20 2247

## 2020-02-21 NOTE — ED Notes (Signed)
ED Provider at bedside. 

## 2020-02-22 ENCOUNTER — Encounter (HOSPITAL_COMMUNITY): Payer: Self-pay | Admitting: Family Medicine

## 2020-02-22 DIAGNOSIS — Z7901 Long term (current) use of anticoagulants: Secondary | ICD-10-CM | POA: Diagnosis not present

## 2020-02-22 DIAGNOSIS — Z79890 Hormone replacement therapy (postmenopausal): Secondary | ICD-10-CM | POA: Diagnosis not present

## 2020-02-22 DIAGNOSIS — Z87891 Personal history of nicotine dependence: Secondary | ICD-10-CM | POA: Diagnosis not present

## 2020-02-22 DIAGNOSIS — K648 Other hemorrhoids: Secondary | ICD-10-CM | POA: Diagnosis present

## 2020-02-22 DIAGNOSIS — Z66 Do not resuscitate: Secondary | ICD-10-CM | POA: Diagnosis present

## 2020-02-22 DIAGNOSIS — K921 Melena: Secondary | ICD-10-CM | POA: Diagnosis present

## 2020-02-22 DIAGNOSIS — Z8673 Personal history of transient ischemic attack (TIA), and cerebral infarction without residual deficits: Secondary | ICD-10-CM | POA: Diagnosis not present

## 2020-02-22 DIAGNOSIS — E039 Hypothyroidism, unspecified: Secondary | ICD-10-CM | POA: Diagnosis present

## 2020-02-22 DIAGNOSIS — I110 Hypertensive heart disease with heart failure: Secondary | ICD-10-CM | POA: Diagnosis present

## 2020-02-22 DIAGNOSIS — Z886 Allergy status to analgesic agent status: Secondary | ICD-10-CM | POA: Diagnosis not present

## 2020-02-22 DIAGNOSIS — I2510 Atherosclerotic heart disease of native coronary artery without angina pectoris: Secondary | ICD-10-CM | POA: Diagnosis present

## 2020-02-22 DIAGNOSIS — K7581 Nonalcoholic steatohepatitis (NASH): Secondary | ICD-10-CM | POA: Diagnosis present

## 2020-02-22 DIAGNOSIS — E785 Hyperlipidemia, unspecified: Secondary | ICD-10-CM | POA: Diagnosis present

## 2020-02-22 DIAGNOSIS — K644 Residual hemorrhoidal skin tags: Secondary | ICD-10-CM | POA: Diagnosis present

## 2020-02-22 DIAGNOSIS — Z20822 Contact with and (suspected) exposure to covid-19: Secondary | ICD-10-CM | POA: Diagnosis present

## 2020-02-22 DIAGNOSIS — I071 Rheumatic tricuspid insufficiency: Secondary | ICD-10-CM | POA: Diagnosis present

## 2020-02-22 DIAGNOSIS — Z79899 Other long term (current) drug therapy: Secondary | ICD-10-CM | POA: Diagnosis not present

## 2020-02-22 DIAGNOSIS — Z8249 Family history of ischemic heart disease and other diseases of the circulatory system: Secondary | ICD-10-CM | POA: Diagnosis not present

## 2020-02-22 DIAGNOSIS — K922 Gastrointestinal hemorrhage, unspecified: Secondary | ICD-10-CM

## 2020-02-22 DIAGNOSIS — I712 Thoracic aortic aneurysm, without rupture: Secondary | ICD-10-CM | POA: Diagnosis present

## 2020-02-22 DIAGNOSIS — M353 Polymyalgia rheumatica: Secondary | ICD-10-CM | POA: Diagnosis present

## 2020-02-22 DIAGNOSIS — I4821 Permanent atrial fibrillation: Secondary | ICD-10-CM | POA: Diagnosis present

## 2020-02-22 DIAGNOSIS — K5730 Diverticulosis of large intestine without perforation or abscess without bleeding: Secondary | ICD-10-CM | POA: Diagnosis present

## 2020-02-22 DIAGNOSIS — D62 Acute posthemorrhagic anemia: Secondary | ICD-10-CM | POA: Diagnosis present

## 2020-02-22 DIAGNOSIS — I5032 Chronic diastolic (congestive) heart failure: Secondary | ICD-10-CM | POA: Diagnosis present

## 2020-02-22 LAB — HEMOGLOBIN
Hemoglobin: 7.7 g/dL — ABNORMAL LOW (ref 12.0–15.0)
Hemoglobin: 8.1 g/dL — ABNORMAL LOW (ref 12.0–15.0)
Hemoglobin: 8.7 g/dL — ABNORMAL LOW (ref 12.0–15.0)

## 2020-02-22 LAB — BASIC METABOLIC PANEL
Anion gap: 11 (ref 5–15)
BUN: 13 mg/dL (ref 8–23)
CO2: 21 mmol/L — ABNORMAL LOW (ref 22–32)
Calcium: 8.8 mg/dL — ABNORMAL LOW (ref 8.9–10.3)
Chloride: 108 mmol/L (ref 98–111)
Creatinine, Ser: 0.92 mg/dL (ref 0.44–1.00)
GFR calc Af Amer: >60 mL/min (ref >60)
GFR calc non Af Amer: 56 mL/min — ABNORMAL LOW (ref >60)
Glucose, Bld: 86 mg/dL (ref 70–99)
Potassium: 3.7 mmol/L (ref 3.5–5.1)
Sodium: 140 mmol/L (ref 135–145)

## 2020-02-22 LAB — HEMATOCRIT
HCT: 24.3 % — ABNORMAL LOW (ref 36.0–46.0)
HCT: 26.3 % — ABNORMAL LOW (ref 36.0–46.0)
HCT: 28.1 % — ABNORMAL LOW (ref 36.0–46.0)

## 2020-02-22 MED ORDER — PNEUMOCOCCAL VAC POLYVALENT 25 MCG/0.5ML IJ INJ
0.5000 mL | INJECTION | INTRAMUSCULAR | Status: DC
  Filled 2020-02-22: qty 0.5

## 2020-02-22 MED ORDER — MAGNESIUM CITRATE PO SOLN
1.0000 | Freq: Once | ORAL | Status: AC
  Administered 2020-02-22: 1 via ORAL
  Filled 2020-02-22: qty 296

## 2020-02-22 MED ORDER — POLYETHYLENE GLYCOL 3350 17 G PO PACK
17.0000 g | PACK | Freq: Four times a day (QID) | ORAL | Status: DC
  Administered 2020-02-22 – 2020-02-24 (×6): 17 g via ORAL
  Filled 2020-02-22 (×6): qty 1

## 2020-02-22 MED ORDER — SODIUM CHLORIDE 0.9 % IV SOLN: INTRAVENOUS | Status: DC

## 2020-02-22 NOTE — Care Management Obs Status (Signed)
MEDICARE OBSERVATION STATUS NOTIFICATION   Patient Details  Name: [name] [name] MRN: [id number] Date of Birth: Nov 06, 1930   Medicare Observation Status Notification Given:  Yes    MahabirJuliann Pulse, RN 02/22/2020, 11:09 AM

## 2020-02-22 NOTE — Consult Note (Signed)
Reason for Consult: Lower GI bleed Referring Physician: Hospital team  [name] [name] is an 86 y.o. female.  HPI: Patient seen and examined in her hospital computer chart and our office computer chart was reviewed and she had heavy bright red blood per rectum on Sunday and dabbing her hemorrhoids seem to finally help and she has not seen any blood in her bowels in over a year and she is on a blood thinner which we discussed and other than some periodic constipation she has no other GI complaints and only had a little old blood yesterday and has not had a bowel movement today and her family history is negative for any obvious GI problems except for one aunt and she lives at home with her husband and is on blood thinners for her A. fib and had been previously on Coumadin but had a brain bleed on that and has no other complaints and specifically her last colonoscopy was reviewed and normal in 2011 in our office and she has had some dyspnea on exertion for a while which has not been any worse lately  Past Medical History:  Diagnosis Date  . CAD (coronary artery disease)    a. LHC 10/2017 30% RCA otherwise OK.  . Chronic diastolic CHF (congestive heart failure) ([place])   . Hemorrhoids    current problem as of 04/26/16  with slight bleeding of hemorrhoids   . Hepatitis    hx of medication induced hepatitis (Vytorin per pt)  . History of kidney stones   . HLD (hyperlipidemia)   . HTN (hypertension)   . Hx of echocardiogram    echo 1/13: EF 55%, Gr 2 diast dysfn, Asc Ao aneurysm 4.8 cm, mild MR, mod LAE, mild RAE  . Hypothyroidism   . ICH (intracerebral hemorrhage) ([place]) 03/24/15  . Lower extremity edema   . NASH (nonalcoholic steatohepatitis)   . Neuromuscular disorder ([place])   . Pacemaker-Medtronic 11/12/2011   Implanted 2013   . Permanent atrial fibrillation ([place])   . PMR (polymyalgia rheumatica) (H[facility])    no steriods for 5 years  . sinus node dysfunction//post termination pauses   . Stroke ([place])    . Thoracic aortic aneurysm (TAA) ([place])    a. followed by Dr. Servando Snare.  . Tricuspid regurgitation   . Urinary tract infection    Recurrent infections.     Past Surgical History:  Procedure Laterality Date  . CARDIAC CATHETERIZATION    . CARDIOVERSION  03/02/2012   Procedure: CARDIOVERSION;  Surgeon: Lelon Perla, MD;  Location: [facility] ENDOSCOPY;  Service: Cardiovascular;  Laterality: N/A;  . CARDIOVERSION  03/13/2012   Procedure: CARDIOVERSION;  Surgeon: Josue Hector, MD;  Location: [place];  Service: Cardiovascular;  Laterality: N/A;  . CARDIOVERSION  04/15/2012   Procedure: CARDIOVERSION;  Surgeon: [name], MD;  Location: [facility];  Service: Cardiovascular;  Laterality: N/A;  . CARDIOVERSION N/A 06/07/2014   Procedure: CARDIOVERSION;  Surgeon: [name], MD;  Location: [facility] ENDOSCOPY;  Service: Cardiovascular;  Laterality: N/A;  . CARDIOVERSION N/A 08/18/2014   Procedure: CARDIOVERSION;  Surgeon: [name], MD;  Location: [place];  Service: Cardiovascular;  Laterality: N/A;  . CARDIOVERSION N/A 09/06/2015   Procedure: CARDIOVERSION;  Surgeon: Jerline Pain, MD;  Location: [place];  Service: Cardiovascular;  Laterality: N/A;  . CYSTOSCOPY WITH STENT PLACEMENT Right 04/22/2016   Procedure: CYSTOSCOPY WITH RIGHT URETERAL STENT INSERTION;  Surgeon: [name], MD;  Location: [place];  Service: Urology;  Laterality: Right;  .  CYSTOSCOPY WITH URETEROSCOPY, STONE BASKETRY AND STENT PLACEMENT Right 04/30/2016   Procedure: CYSTOSCOPY WITH URETEROSCOPY, STENT PLACEMENT REMOVAL;  Surgeon: [name], MD;  [facility];  Service: Urology;  Laterality: Right;  . HERNIA REPAIR    . INCISION AND DRAINAGE BREAST ABSCESS  1973  . LEFT HEART CATH AND CORONARY ANGIOGRAPHY N/A 10/28/2017   Procedure: LEFT HEART CATH AND CORONARY ANGIOGRAPHY;  Surgeon: Lorretta Harp, MD;  Location: [place] CV LAB;  Service: Cardiovascular;  Laterality: N/A;  . PACEMAKER  INSERTION  Jan 2013  . PERMANENT PACEMAKER INSERTION N/A 07/29/2011   Procedure: PERMANENT PACEMAKER INSERTION;  Surgeon: [name], MD;  Location: [facility] CATH LAB;  Service: Cardiovascular;  Laterality: N/A;  . UMBILICAL HERNIA REPAIR  1950's    Family History  Problem Relation Age of Onset  . Coronary artery disease Mother   . Coronary artery disease Other   . Alzheimer's disease Other   . Coronary artery disease Brother   . Coronary artery disease Brother     Social History:  reports that she quit smoking about 29 years ago. Her smoking use included cigarettes. She has never used smokeless tobacco. She reports that she does not drink alcohol and does not use drugs.  Allergies:  Allergies  Allergen Reactions  . Penicillins Anaphylaxis    Has patient had a PCN reaction causing immediate rash, facial/tongue/throat swelling, SOB or lightheadedness with hypotension: Yes Has patient had a PCN reaction causing severe rash involving mucus membranes or skin necrosis: No Has patient had a PCN reaction that required hospitalization No Has patient had a PCN reaction occurring within the last 10 years: No If all of the above answers are "NO", then may proceed with Cephalosporin use.   . Statins Other (See Comments)    REACTION: elevated LFT's  . Tylenol [Acetaminophen] Other (See Comments)    If taken with Vytorin at risk for liver damage  . Adhesive [Tape] Other (See Comments)    Irritates skin  . Amiodarone Other (See Comments)  . Amiodarone Other (See Comments)    unk  . Codeine Other (See Comments)    Weird feeling  . Penicillins Swelling    Did it involve swelling of the face/tongue/throat, SOB, or low BP? Y Did it involve sudden or severe rash/hives, skin peeling, or any reaction on the inside of your mouth or nose? N Did you need to seek medical attention at a hospital or doctor's office? N When did it last happen? Over 40 years ago If all above answers are "NO", may  proceed with cephalosporin use.    . Quinolones   . Quinolones Other (See Comments)    UNk  . Statins Other (See Comments)    Liver enzymes elevated  . Tape Other (See Comments)    Redness, Please use "paper" tape only.  . Codeine Other (See Comments)    feel funny, head is fuzzy    Medications: I have reviewed the patient's current medications.  Results for orders placed or performed during the hospital encounter of 02/21/20 (from the past 48 hour(s))  Comprehensive metabolic panel     Status: Abnormal   Collection Time: 02/21/20  6:49 PM  Result Value Ref Range   Sodium 139 135 - 145 mmol/L   Potassium 3.6 3.5 - 5.1 mmol/L   Chloride 107 98 - 111 mmol/L   CO2 24 22 - 32 mmol/L   Glucose, Bld 101 (H) 70 - 99 mg/dL    Comment:  Glucose reference range applies only to samples taken after fasting for at least 8 hours.   BUN 15 8 - 23 mg/dL   Creatinine, Ser 0.97 0.44 - 1.00 mg/dL   Calcium 9.5 8.9 - 10.3 mg/dL   Total Protein 5.7 (L) 6.5 - 8.1 g/dL   Albumin 3.4 (L) 3.5 - 5.0 g/dL   AST 16 15 - 41 U/L   ALT 13 0 - 44 U/L   Alkaline Phosphatase 52 38 - 126 U/L   Total Bilirubin 0.6 0.3 - 1.2 mg/dL   GFR calc non Af Amer 53 (L) >60 mL/min   GFR calc Af Amer >60 >60 mL/min   Anion gap 8 5 - 15    Comment: Performed at [facility], [place] [address], [place], [place] [address]  CBC     Status: Abnormal   Collection Time: 02/21/20  6:49 PM  Result Value Ref Range   WBC 6.6 4.0 - 10.5 K/uL   RBC 3.02 (L) 3.87 - 5.11 MIL/uL   Hemoglobin 9.1 (L) 12.0 - 15.0 g/dL   HCT 28.6 (L) 36 - 46 %   MCV 94.7 80.0 - 100.0 fL   MCH 30.1 26.0 - 34.0 pg   MCHC 31.8 30.0 - 36.0 g/dL   RDW 14.6 11.5 - 15.5 %   Platelets 263 150 - 400 K/uL   nRBC 0.0 0.0 - 0.2 %    Comment: Performed at [facility], [place] [address], [place], [place] [address]  Type and screen [place]     Status: None   Collection Time: 02/21/20  6:49 PM  Result  Value Ref Range   ABO/RH(D) A POS    Antibody Screen NEG    Sample Expiration      02/24/2020,2359 Performed at [facility], [place] [address] [place],  [address]   POC occult blood, ED     Status: Abnormal   Collection Time: 02/21/20  9:24 PM  Result Value Ref Range   Fecal Occult Bld POSITIVE (A) NEGATIVE  SARS Coronavirus 2 by RT PCR (hospital order, performed in [facility] hospital lab) Nasopharyngeal Nasopharyngeal Swab     Status: None   Collection Time: 02/21/20  9:44 PM   Specimen: Nasopharyngeal Swab  Result Value Ref Range   SARS Coronavirus 2 NEGATIVE NEGATIVE    Comment: (NOTE) SARS-CoV-2 target nucleic acids are NOT DETECTED.  The SARS-CoV-2 RNA is generally detectable in upper and lower respiratory specimens during the acute phase of infection. The lowest concentration of SARS-CoV-2 viral copies this assay can detect is 250 copies / mL. A negative result does not preclude SARS-CoV-2 infection and should not be used as the sole basis for treatment or other patient management decisions.  A negative result may occur with improper specimen collection / handling, submission of specimen other than nasopharyngeal swab, presence of viral mutation(s) within the areas targeted by this assay, and inadequate number of viral copies (<250 copies / mL). A negative result must be combined with clinical observations, patient history, and epidemiological information.  Fact Sheet for Patients:   StrictlyIdeas.no  Fact Sheet for Healthcare Providers: BankingDealers.co.za  This test is not yet approved or  cleared by the [place] FDA and has been authorized for detection and/or diagnosis of SARS-CoV-2 by FDA under an Emergency Use Authorization (EUA).  This EUA will remain in effect (meaning this test can be used) for the duration of the COVID-19 declaration under Section 564(b)(1)  of the Act, 21  U.S.C. section 360bbb-3(b)(1), unless the authorization is terminated or revoked sooner.  Performed at [facility], [place] [address], [place], [place] [address]   Hemoglobin     Status: Abnormal   Collection Time: 02/21/20 11:42 PM  Result Value Ref Range   Hemoglobin 8.7 (L) 12.0 - 15.0 g/dL    Comment: Performed at [facility], [place] [address] [place], [place] [address]  Hematocrit     Status: Abnormal   Collection Time: 02/21/20 11:42 PM  Result Value Ref Range   HCT 28.1 (L) 36 - 46 %    Comment: Performed at [facility], [place] [address] [place], [place] [address]  Basic metabolic panel     Status: Abnormal   Collection Time: 02/22/20  7:35 AM  Result Value Ref Range   Sodium 140 135 - 145 mmol/L   Potassium 3.7 3.5 - 5.1 mmol/L   Chloride 108 98 - 111 mmol/L   CO2 21 (L) 22 - 32 mmol/L   Glucose, Bld 86 70 - 99 mg/dL    Comment: Glucose reference range applies only to samples taken after fasting for at least 8 hours.   BUN 13 8 - 23 mg/dL   Creatinine, Ser 0.92 0.44 - 1.00 mg/dL   Calcium 8.8 (L) 8.9 - 10.3 mg/dL   GFR calc non Af Amer 56 (L) >60 mL/min   GFR calc Af Amer >60 >60 mL/min   Anion gap 11 5 - 15    Comment: Performed at [facility], [place] [address] [place], [place] [address]  Hemoglobin     Status: Abnormal   Collection Time: 02/22/20  7:35 AM  Result Value Ref Range   Hemoglobin 7.7 (L) 12.0 - 15.0 g/dL    Comment: Performed at [facility], [place] [address] [place], [place] [address]  Hematocrit     Status: Abnormal   Collection Time: 02/22/20  7:35 AM  Result Value Ref Range   HCT 24.3 (L) 36 - 46 %    Comment: Performed at [facility], [place] [address], [place], [place] [address]    No results found.  Review of Systems negative except above Blood pressure 111/61, pulse 82, temperature 98.6 F (37 C), temperature source Oral, resp.  rate 20, height 5' 4"  (1.626 m), weight 69.7 kg, SpO2 99 %. Physical Exam Vital signs stable afebrile no acute distress exam pertinent for abdomen being soft nontender labs pertinent for significant decrease Assessment/Plan: Bright red blood per rectum and patient on blood thinners Plan: We discussed flex sig and possible colonoscopy including risks benefits and methods and after our conversation she is elected to proceed tomorrow with further work-up and plans pending those findings  [place] E 02/22/2020, 10:32 AM

## 2020-02-22 NOTE — Progress Notes (Signed)
PROGRESS NOTE    [name] [name]  SEG:[id number] DOB: Aug 22, 1930 DOA: 02/21/2020 PCP: [name], MD   Chief Complaint  Patient presents with   Rectal Bleeding    Brief Narrative: HPI per Dr. Rock Nephew is a 86 y.o. female with medical history significant for atrial fibrillation on Eliquis, chronic diastolic CHF, hypothyroidism, history of CVA, and hypertension, now presenting to emergency department with rectal bleeding.  The patient reported 1 day of rectal bleeding without any abdominal pain or nausea, similar to her history of hemorrhoidal bleeding but heavier than usual.  Patient reported seeing red and maroon blood in her undergarments, has been attempting to control the bleeding with compression which has been successful with her prior hemorrhoidal bleeding, but she continued to have bleeding, prompting her presentation to the ED.  She denied any abdominal pain, denied nausea or vomiting, and denied any chest pain or lightheadedness.  She did not take her Eliquis this morning or this evening.  ED Course: Upon arrival to the ED, patient is found to be afebrile, saturating well, and with stable blood pressure.  Chemistry panel is unremarkable and CBC notable for a hemoglobin of 9.1, down from 12.6 in March 2021.  Fecal occult blood testing is positive.  COVID-19 screening test not yet resulted.  Gastroenterology was consulted by the ED physician.  Hospitalist asked to admit.  Assessment & Plan:   Principal Problem:   Acute lower GI bleeding Active Problems:   Atrial fibrillation (HCC)   Chronic diastolic heart failure (HCC)   Hypothyroidism   CAD in native artery   Normocytic anemia  #1 acute lower GI bleed/acute blood loss anemia Patient presented with history of 1 day of rectal bleeding, described as similar to hemorrhoidal bleeding but heavier.  FOBT positive.  Hemoglobin on admission was 9.1 last hemoglobin in the system was 12.6 in March 2021.  Hemoglobin trending  down currently at 7.7 this morning.  Patient denies any further bleeding today.  GI consulted and patient seen in consultation by Dr. Watt Climes.  GI recommending flex sig versus colonoscopy to be done tomorrow.  Continue serial H&H.  Continue clears.  Transfusion threshold hemoglobin < 7.  Continue to hold anticoagulation.  Per GI.  2.  Permanent atrial fibrillation CHA2DS2VASC score at least 6.  Continue Cardizem and labetalol for rate control.  Anticoagulation on hold secondary to problem #1.  GI to advise when anticoagulation may be resumed.  Follow.  3.  Hypertension Blood pressure borderline.  Hold Aldactone, Lasix, Cozaar.  Follow.  4.  Hypothyroidism Continue Synthroid.  5.  Chronic diastolic heart failure Compensated.  Due to bleeding diuretics on hold.  Continue labetalol, Cardizem.  Monitor closely with gentle hydration.  Follow.   DVT prophylaxis: SCDs.  Patient with GI bleed. Code Status: DNR Family Communication: Updated patient and husband at bedside. Disposition:   Status is: Inpatient    Dispo: The patient is from: Home              Anticipated d/c is to: Home              Anticipated d/c date is: To be determined              Patient currently coming with a GI bleed, anemic, hemoglobin trending down, seen by GI who are recommending flex sig versus colonoscopy tomorrow.  Not stable for discharge.       Consultants:   Gastroenterology: Dr. Watt Climes 02/22/2020  Procedures:   None  Antimicrobials:   None   Subjective: Patient sitting up in bed.  Denies chest pain.  No shortness of breath.  Denies any further bleeding this morning.  Husband at bedside.  Objective: Vitals:   02/22/20 0222 02/22/20 0500 02/22/20 0603 02/22/20 1139  BP: (!) 109/54  111/61 (!) 117/53  Pulse: 73  82   Resp: 20  20   Temp: 98.2 F (36.8 C)  98.6 F (37 C)   TempSrc: Oral  Oral   SpO2: 99%  99%   Weight:  69.7 kg    Height:        Intake/Output Summary (Last 24 hours) at  02/22/2020 1153 Last data filed at 02/22/2020 8416 Gross per 24 hour  Intake 250 ml  Output 150 ml  Net 100 ml   Filed Weights   02/21/20 1842 02/21/20 2300 02/22/20 0500  Weight: 68.9 kg 68.9 kg 69.7 kg    Examination:  General exam: Appears calm and comfortable.  Pallor Respiratory system: Clear to auscultation. Respiratory effort normal. Cardiovascular system: Irregularly irregular. No JVD, murmurs, rubs, gallops or clicks. No pedal edema. Gastrointestinal system: Abdomen is nondistended, soft and nontender. No organomegaly or masses felt. Normal bowel sounds heard. Central nervous system: Alert and oriented. No focal neurological deficits. Extremities: Symmetric 5 x 5 power. Skin: No rashes, lesions or ulcers Psychiatry: Judgement and insight appear normal. Mood & affect appropriate.     Data Reviewed: I have personally reviewed following labs and imaging studies  CBC: Recent Labs  Lab 02/21/20 1849 02/21/20 2342 02/22/20 0735  WBC 6.6  --   --   HGB 9.1* 8.7* 7.7*  HCT 28.6* 28.1* 24.3*  MCV 94.7  --   --   PLT 263  --   --     Basic Metabolic Panel: Recent Labs  Lab 02/21/20 1849 02/22/20 0735  NA 139 140  K 3.6 3.7  CL 107 108  CO2 24 21*  GLUCOSE 101* 86  BUN 15 13  CREATININE 0.97 0.92  CALCIUM 9.5 8.8*    GFR: Estimated Creatinine Clearance: 42.1 mL/min (by C-G formula based on SCr of 0.92 mg/dL).  Liver Function Tests: Recent Labs  Lab 02/21/20 1849  AST 16  ALT 13  ALKPHOS 52  BILITOT 0.6  PROT 5.7*  ALBUMIN 3.4*    CBG: No results for input(s): GLUCAP in the last 168 hours.   Recent Results (from the past 240 hour(s))  SARS Coronavirus 2 by RT PCR (hospital order, performed in [facility] hospital lab) Nasopharyngeal Nasopharyngeal Swab     Status: None   Collection Time: 02/21/20  9:44 PM   Specimen: Nasopharyngeal Swab  Result Value Ref Range Status   SARS Coronavirus 2 NEGATIVE NEGATIVE Final    Comment: (NOTE) SARS-CoV-2  target nucleic acids are NOT DETECTED.  The SARS-CoV-2 RNA is generally detectable in upper and lower respiratory specimens during the acute phase of infection. The lowest concentration of SARS-CoV-2 viral copies this assay can detect is 250 copies / mL. A negative result does not preclude SARS-CoV-2 infection and should not be used as the sole basis for treatment or other patient management decisions.  A negative result may occur with improper specimen collection / handling, submission of specimen other than nasopharyngeal swab, presence of viral mutation(s) within the areas targeted by this assay, and inadequate number of viral copies (<250 copies / mL). A negative result must be combined with clinical observations, patient history, and epidemiological information.  Fact Sheet for  Patients:   StrictlyIdeas.no  Fact Sheet for Healthcare Providers: BankingDealers.co.za  This test is not yet approved or  cleared by the [place] FDA and has been authorized for detection and/or diagnosis of SARS-CoV-2 by FDA under an Emergency Use Authorization (EUA).  This EUA will remain in effect (meaning this test can be used) for the duration of the COVID-19 declaration under Section 564(b)(1) of the Act, 21 U.S.C. section 360bbb-3(b)(1), unless the authorization is terminated or revoked sooner.  Performed at [facility], [place] [address], [place], [place] [address]          Radiology Studies: No results found.      Scheduled Meds:  diltiazem  120 mg Oral BID   labetalol  300 mg Oral Daily   labetalol  450 mg Oral QHS   levothyroxine  25 mcg Oral Q0600   [START ON 02/23/2020] pneumococcal 23 valent vaccine  0.5 mL Intramuscular Tomorrow-1000   polyethylene glycol  17 g Oral QID   sodium chloride flush  3 mL Intravenous Q12H   sodium chloride flush  3 mL Intravenous Q12H   Continuous Infusions:  sodium  chloride       LOS: 0 days    Time spent: 35 minutes    [name], MD [facility]   To contact the attending provider between 7A-7P or the covering provider during after hours 7P-7A, please log into the web site [email or web address] and access using universal [place] password for that web site. If you do not have the password, please call the hospital operator.  02/22/2020, 11:53 AM

## 2020-02-22 NOTE — TOC Initial Note (Signed)
Transition of Care [facility]) - Initial/Assessment Note    Patient Details  Name: [name] [name] MRN: [id number] Date of Birth: [birth year]  [facility] CM/SW Contact:    Dessa Phi, RN Phone Number: 02/22/2020, 11:04 AM  Clinical Narrative: From home d/c plan return home. Active w/[facility]. If needed @ home will need ordered.                    Barriers to Discharge: Continued Medical Work up   Patient Goals and CMS Choice Patient states their goals for this hospitalization and ongoing recovery are:: return home w/[facility]HC CMS Medicare.gov Compare Post Acute Care list provided to:: Patient Choice offered to / list presented to : Patient  Expected Discharge Plan and Services     Discharge Planning Services: CM Consult Post Acute Care Choice: [place] arrangements for the past 2 months: Single Family Home                                      Prior Living Arrangements/Services Living arrangements for the past 2 months: Single Family Home Lives with:: Spouse Patient language and need for interpreter reviewed:: Yes Do you feel safe going back to the place where you live?: Yes      Need for Family Participation in Patient Care: No (Comment) Care giver support system in place?: Yes (comment) Current home services: DME, Home PT (rw;Active w/[facility]) Criminal Activity/Legal Involvement Pertinent to Current Situation/Hospitalization: No - Comment as needed  Activities of Daily Living Home Assistive Devices/Equipment: Cane (specify quad or straight) (straight) ADL Screening (condition at time of admission) Patient's cognitive ability adequate to safely complete daily activities?: No Is the patient deaf or have difficulty hearing?: No Does the patient have difficulty seeing, even when wearing glasses/contacts?: No Does the patient have difficulty concentrating, remembering, or making decisions?: No Patient able to express need for assistance with  ADLs?: No Does the patient have difficulty dressing or bathing?: No Independently performs ADLs?: No Communication: Independent Dressing (OT): Independent Grooming: Independent Feeding: Independent Bathing: Independent Toileting: Independent with device (comment) (uses walking stick at home) In/Out Bed: Independent Walks in Home: Independent with device (comment) Does the patient have difficulty walking or climbing stairs?: No Weakness of Legs: None Weakness of Arms/Hands: None  Permission Sought/Granted Permission sought to share information with : Case Manager Permission granted to share information with : Yes, Verbal Permission Granted  Share Information with NAME: Case Manager           Emotional Assessment Appearance:: Appears stated age Attitude/Demeanor/Rapport: Gracious Affect (typically observed): Accepting Orientation: : Oriented to Self, Oriented to Place, Oriented to  Time, Oriented to Situation Alcohol / Substance Use: Not Applicable Psych Involvement: No (comment)  Admission diagnosis:  Acute lower GI bleeding [K92.2] Lower GI bleed [K92.2] Patient Active Problem List   Diagnosis Date Noted   Acute lower GI bleeding 02/21/2020   Normocytic anemia 02/21/2020   Migraine equivalent 01/06/2017   Calculus of kidney    Chronic diastolic CHF (congestive heart failure) (HCC)    AAA (abdominal aortic aneurysm) without rupture (HCC)    PMR (polymyalgia rheumatica) (HCC)    Hypothyroidism    Calculus of ureter    CAD in native artery    Other specified hypothyroidism    Elevated LFTs 04/22/2016   Hydronephrosis 04/22/2016   Renal lithiasis 04/22/2016   UTI (urinary tract  infection) 04/22/2016   Acute kidney injury ([place]) 04/22/2016   Atrial flutter ([place])    Nontraumatic cortical hemorrhage of left cerebral hemisphere ([place]) 05/03/2015   Chronic anticoagulation 05/03/2015   Cardiac pacemaker in situ 05/03/2015   Encounter for therapeutic  drug monitoring 10/04/2013   Dyspnea on exertion 07/26/2013   Chest pain 02/04/2013   Orthostatic lightheadedness 06/25/2012   Pacemaker-[employer]tr[employer] 11/12/2011   Ascending aorta enlargement ([place]) 08/12/2011   Chronic diastolic heart failure ([place]) 12/27/2010   Long term (current) use of anticoagulants 10/15/2010   PREMATURE VENTRICULAR CONTRACTIONS 10/17/2009   post termination pauses/.sinus bradycardia 11/17/2008   Essential hypertension 04/28/2008   Atrial fibrillation ([place]) 04/28/2008   PCP:  [name], MD Pharmacy:   [facility], [address] [place] [address] Phone: [phone] Fax: [phone]     Social Determinants of Health (SDOH) Interventions    Readmission Risk Interventions No flowsheet data found.

## 2020-02-23 ENCOUNTER — Inpatient Hospital Stay (HOSPITAL_COMMUNITY): Payer: Medicare PPO | Admitting: Anesthesiology

## 2020-02-23 ENCOUNTER — Encounter (HOSPITAL_COMMUNITY)
Admission: EM | Disposition: A | Discharge status: Home / Self Care | Payer: Self-pay | Source: Home / Self Care | Attending: Family Medicine

## 2020-02-23 ENCOUNTER — Encounter (HOSPITAL_COMMUNITY): Payer: Self-pay | Admitting: Family Medicine

## 2020-02-23 HISTORY — PX: BIOPSY: SHX5522

## 2020-02-23 HISTORY — PX: COLONOSCOPY: SHX5424

## 2020-02-23 LAB — BASIC METABOLIC PANEL
Anion gap: 6 (ref 5–15)
BUN: 10 mg/dL (ref 8–23)
CO2: 24 mmol/L (ref 22–32)
Calcium: 8.7 mg/dL — ABNORMAL LOW (ref 8.9–10.3)
Chloride: 109 mmol/L (ref 98–111)
Creatinine, Ser: 0.84 mg/dL (ref 0.44–1.00)
GFR calc Af Amer: >60 mL/min (ref >60)
GFR calc non Af Amer: >60 mL/min (ref >60)
Glucose, Bld: 98 mg/dL (ref 70–99)
Potassium: 4.2 mmol/L (ref 3.5–5.1)
Sodium: 139 mmol/L (ref 135–145)

## 2020-02-23 LAB — HEMATOCRIT
HCT: 23.2 % — ABNORMAL LOW (ref 36.0–46.0)
HCT: 24.3 % — ABNORMAL LOW (ref 36.0–46.0)

## 2020-02-23 LAB — HEMOGLOBIN
Hemoglobin: 7.4 g/dL — ABNORMAL LOW (ref 12.0–15.0)
Hemoglobin: 7.5 g/dL — ABNORMAL LOW (ref 12.0–15.0)

## 2020-02-23 SURGERY — COLONOSCOPY
Anesthesia: Monitor Anesthesia Care

## 2020-02-23 MED ORDER — LOSARTAN POTASSIUM 25 MG PO TABS
12.5000 mg | ORAL_TABLET | Freq: Every day | ORAL | Status: DC
  Administered 2020-02-23 – 2020-02-25 (×3): 12.5 mg via ORAL
  Filled 2020-02-23 (×3): qty 1

## 2020-02-23 MED ORDER — PHENYLEPHRINE 40 MCG/ML (10ML) SYRINGE FOR IV PUSH (FOR BLOOD PRESSURE SUPPORT)
PREFILLED_SYRINGE | INTRAVENOUS | Status: DC | PRN
  Administered 2020-02-23: 160 ug via INTRAVENOUS
  Administered 2020-02-23: 80 ug via INTRAVENOUS

## 2020-02-23 MED ORDER — PROPOFOL 10 MG/ML IV BOLUS
INTRAVENOUS | Status: DC | PRN
  Administered 2020-02-23 (×6): 20 mg via INTRAVENOUS
  Administered 2020-02-23: 40 mg via INTRAVENOUS
  Administered 2020-02-23 (×2): 20 mg via INTRAVENOUS

## 2020-02-23 MED ORDER — HYDROCORTISONE (PERIANAL) 2.5 % EX CREA
TOPICAL_CREAM | Freq: Three times a day (TID) | CUTANEOUS | Status: DC
  Filled 2020-02-23: qty 28.35

## 2020-02-23 MED ORDER — EPHEDRINE SULFATE-NACL 50-0.9 MG/10ML-% IV SOSY
PREFILLED_SYRINGE | INTRAVENOUS | Status: DC | PRN
  Administered 2020-02-23: 15 mg via INTRAVENOUS

## 2020-02-23 MED ORDER — HYDROCORTISONE ACETATE 25 MG RE SUPP
25.0000 mg | Freq: Every evening | RECTAL | Status: DC | PRN
  Administered 2020-02-24: 25 mg via RECTAL
  Filled 2020-02-23 (×4): qty 1

## 2020-02-23 MED ORDER — LIDOCAINE 2% (20 MG/ML) 5 ML SYRINGE
INTRAMUSCULAR | Status: DC | PRN
  Administered 2020-02-23: 40 mg via INTRAVENOUS

## 2020-02-23 NOTE — Progress Notes (Signed)
PROGRESS NOTE    [name] [name]  BJS:[id number] DOB: 09/14/1930 DOA: 02/21/2020 PCP: [name], MD  Brief Narrative:  12 home dwelling white female Chronic atrial fibrillation status post pacemaker on Eliquis Thoracic aortic aneurysm followed by Dr. Servando Snare since 2012 Intracranial hemorrhage parietal 2/2 HTN and Coumadin 2016-reversed with Kcentra Left heart cath 11/19/2017 noncritical CAD-followed by Dr. Alvester Chou Hyperthyroidism on methimazole/prednisone HTN HFpEF Prior nephrolithiasis status post right double-J stenting 6073 Nonalcoholic steatohepatitis noted in 2012 Admitted with rectal bleeding X1 day?  Secondary to hemorrhoids-seen by gastroenterology Dr. May God  Assessment & Plan:   Principal Problem:   Acute lower GI bleeding Active Problems:   Atrial fibrillation (HCC)   Chronic diastolic heart failure (HCC)   Hypothyroidism   CAD in native artery   Normocytic anemia   Lower GI bleed   Acute blood loss anemia   1. ?  Hemorrhoidal bleed a. Status post colonoscopy showing polyps and internal and external hemorrhoids with bleeding b. Eliquis to be held temporarily-Long discussion with neurology Dr. Erlinda Hong who agrees that benefit of anticoagulation protection from secondary strokes outweighs the risk of minor although irritating bleeding c. I had a long chat with Dr. Watt Climes who informed that the next step if further bleeding is probably her general surgeon who will be seeing her 2. Permanent A. fib CHADS2 score 6-anticoagulation held-PPM in place a. Eliquis held as above b. Continue Cardizem 120 twice daily, labetalol 300 daily 3. HTN a. Resume losartan at a lower dose of 12.5 daily given mild hypotension b. Would hold Aldactone as above 4. Prior intracranial hemorrhage a. See above discussion 5. Hyperthyroidism on methimazole in the past with current hypothyroidism a. Continue 25 mcg daily of Synthroid b. TSH in several weeks 6. Chronic HFpEF without mention of acute  diastolic failure a. Losartan as above 7. CAD history 8. Steatohepatitis  DVT prophylaxis: SCD's Code Status: FULL Family Communication: none Disposition:   Status is: Inpatient  Remains inpatient appropriate because:Unsafe d/c plan and IV treatments appropriate due to intensity of illness or inability to take PO   Dispo: The patient is from: Home              Anticipated d/c is to: Home              Anticipated d/c date is: 1 day              Patient currently is not medically stable to d/c.       Consultants:   gi  Procedures: colonoscopy 8/11  Antimicrobials:     Subjective: She is anxious to go home but tells me that she is not sure if she should call her cardiologist She has an appointment with [place] [facility] sometime next week because of her longstanding hemorrhoids We had a very long detailed shared discussion about the risk of her bleeding versus the risk of a stroke and I have told her that her risks are mitigated by the fact that this is a per year risk-I have also explained to her I cannot give her 100% guarantee she will not have a stroke if she holds her blood thinner for the next several days-she understands all of this clearly and I spoke to her husband in great detail as well  Objective: Vitals:   02/22/20 1139 02/22/20 1355 02/22/20 2120 02/23/20 0648  BP: (!) 117/53 113/61 129/61 (!) 130/52  Pulse:  69 67 69  Resp:  18 19 19   Temp:  97.8  F (36.6 C) 98 F (36.7 C) 98.7 F (37.1 C)  TempSrc:  Oral Oral Oral  SpO2:  99% 100% 99%  Weight:      Height:        Intake/Output Summary (Last 24 hours) at 02/23/2020 0748 Last data filed at 02/23/2020 0646 Gross per 24 hour  Intake 530 ml  Output 2 ml  Net 528 ml   Filed Weights   02/21/20 1842 02/21/20 2300 02/22/20 0500  Weight: 68.9 kg 68.9 kg 69.7 kg    Examination:  General exam: EOMI NCAT no focal deficit Respiratory system: CTA B no added sound no rales no  rhonchi Cardiovascular system: S1-S2 no murmur rub or gallop Gastrointestinal system: Soft no rebound no guarding. Central nervous system: Neurologically intact no focal deficit Extremities: No lower extremity edema ROM to joints intact Skin: No lower extremity edema as above Psychiatry: Pleasant euthymic  Data Reviewed: I have personally reviewed following labs and imaging studies   Radiology Studies: No results found. Hemoglobin 7.5 BUN/creatinine 10/0.8  Scheduled Meds: . diltiazem  120 mg Oral BID  . labetalol  300 mg Oral Daily  . labetalol  450 mg Oral QHS  . levothyroxine  25 mcg Oral Q0600  . pneumococcal 23 valent vaccine  0.5 mL Intramuscular Tomorrow-1000  . polyethylene glycol  17 g Oral QID  . sodium chloride flush  3 mL Intravenous Q12H  . sodium chloride flush  3 mL Intravenous Q12H   Continuous Infusions: . sodium chloride    . sodium chloride 20 mL/hr at 02/22/20 2312     LOS: 1 day    Time spent: [place], MD [facility] To contact the attending provider between 7A-7P or the covering provider during after hours 7P-7A, please log into the web site [email or web address] and access using universal [place] password for that web site. If you do not have the password, please call the hospital operator.  02/23/2020, 7:48 AM

## 2020-02-23 NOTE — Transfer of Care (Signed)
Immediate Anesthesia Transfer of Care Note  Patient: ADDILYNE BACKS  Procedure(s) Performed: COLONOSCOPY possible flex sig only (N/A ) BIOPSY  Patient Location: PACU  Anesthesia Type:MAC  Level of Consciousness: sedated  Airway & Oxygen Therapy: Patient Spontanous Breathing and Patient connected to face mask oxygen  Post-op Assessment: Report given to RN and Post -op Vital signs reviewed and stable  Post vital signs: Reviewed and stable  Last Vitals:  Vitals Value Taken Time  BP    Temp    Pulse 65 02/23/20 1153  Resp 20 02/23/20 1153  SpO2 100 % 02/23/20 1153  Vitals shown include unvalidated device data.  Last Pain:  Vitals:   02/23/20 1026  TempSrc: Oral  PainSc: 0-No pain         Complications: No complications documented.

## 2020-02-23 NOTE — Op Note (Signed)
[facility] Patient Name: [name] [name] Procedure Date: 02/23/2020 MRN: [id number] Attending MD: Clarene Essex , MD Date of Birth: 1931/05/03 CSN: [id number] Age: 86 Admit Type: Outpatient Procedure:                Colonoscopy Indications:              Last colonoscopy: 2011, Hematochezia, Acute post                            hemorrhagic anemia Providers:                Clarene Essex, MD, Wynonia Sours, RN, Faustina Mbumina,                            Technician, Lazaro Arms, Technician Referring MD:              Medicines:                Propofol total dose 347 mg IV, Complications:            No immediate complications. Estimated Blood Loss:     Estimated blood loss: none. Procedure:                Pre-Anesthesia Assessment:                           - Prior to the procedure, a History and Physical                            was performed, and patient medications and                            allergies were reviewed. The patient's tolerance of                            previous anesthesia was also reviewed. The risks                            and benefits of the procedure and the sedation                            options and risks were discussed with the patient.                            All questions were answered, and informed consent                            was obtained. Prior Anticoagulants: The patient has                            taken Eliquis (apixaban), last dose was 3 days                            prior to procedure. ASA Grade Assessment: II - A  patient with mild systemic disease. After reviewing                            the risks and benefits, the patient was deemed in                            satisfactory condition to undergo the procedure.                           After obtaining informed consent, the colonoscope                            was passed under direct vision. Throughout the                             procedure, the patient's blood pressure, pulse, and                            oxygen saturations were monitored continuously. The                            PCF-H190DL ([id number]) Olympus pediatric colonscope                            was introduced through the anus and advanced to the                            the cecum, identified by appendiceal orifice and                            ileocecal valve. The ileocecal valve, appendiceal                            orifice, and rectum were photographed. The                            colonoscopy was performed without difficulty. The                            patient tolerated the procedure well. The quality                            of the bowel preparation was adequate. Scope In: 11:29:14 AM Scope Out: 11:48:03 AM Scope Withdrawal Time: 0 hours 12 minutes 48 seconds  Total Procedure Duration: 0 hours 18 minutes 49 seconds  Findings:      External and internal hemorrhoids were found during retroflexion, during       perianal exam and during digital exam. The hemorrhoids were       medium-sized. There were some small clots seen on the hemorrhoids      Four semi-sessile polyps were found in the descending colon, transverse       colon and ascending colon. The polyps were small in size. These were       biopsied with a cold forceps for histology.  A single small-mouthed diverticulum was found in the ascending colon.      The exam was otherwise without abnormality. Without any blood seen in       the entire colon Impression:               - External and internal hemorrhoids.                           - Four small polyps in the descending colon, in the                            transverse colon and in the ascending colon.                            Biopsied.                           - Diverticulosis in the ascending colon.                           - The examination was otherwise normal. Moderate Sedation:      Not Applicable - Patient  had care per Anesthesia. Recommendation:           - Soft diet today.                           - Continue present medications. Decide whether she                            needs her blood thinner back or whether a baby                            aspirin will be good enough going forward                           - Await pathology results.                           - Repeat colonoscopy PRN for surveillance based on                            pathology results.                           - Return to GI office PRN.                           - Telephone GI clinic for pathology results in 1                            week.                           - Telephone GI clinic if symptomatic PRN.                           - Refer  to a colo-rectal surgeon if symptoms                            persist i.e. if bright red blood per rectum's                            continue for hemorrhoidal therapy. In the meantime                            treat with prescription hemorrhoidal creams and                            suppositories at night before bed and avoid                            constipation with probable half dose of MiraLAX                            every day or every other day to soften stools Procedure Code(s):        --- Professional ---                           [phone], Colonoscopy, flexible; with biopsy, single                            or multiple Diagnosis Code(s):        --- Professional ---                           K63.5, Polyp of colon                           K92.1, Melena (includes Hematochezia)                           D62, Acute posthemorrhagic anemia                           K57.30, Diverticulosis of large intestine without                            perforation or abscess without bleeding CPT copyright 2019 [employer]. All rights reserved. The codes documented in this report are preliminary and upon coder review may  be revised to meet current  compliance requirements. Clarene Essex, MD 02/23/2020 11:57:44 AM This report has been signed electronically. Number of Addenda: 0

## 2020-02-23 NOTE — Plan of Care (Signed)
  Problem: Education: Goal: Ability to identify signs and symptoms of gastrointestinal bleeding will improve Outcome: Progressing   Problem: Bowel/Gastric: Goal: Will show no signs and symptoms of gastrointestinal bleeding Outcome: Progressing   Problem: Fluid Volume: Goal: Will show no signs and symptoms of excessive bleeding Outcome: Progressing   Problem: Clinical Measurements: Goal: Complications related to the disease process, condition or treatment will be avoided or minimized Outcome: Progressing   Problem: Education: Goal: Knowledge of General Education information will improve Description: Including pain rating scale, medication(s)/side effects and non-pharmacologic comfort measures Outcome: Progressing   Problem: Health Behavior/Discharge Planning: Goal: Ability to manage health-related needs will improve Outcome: Progressing   Problem: Clinical Measurements: Goal: Ability to maintain clinical measurements within normal limits will improve Outcome: Progressing Goal: Will remain free from infection Outcome: Progressing Goal: Diagnostic test results will improve Outcome: Progressing Goal: Respiratory complications will improve Outcome: Progressing Goal: Cardiovascular complication will be avoided Outcome: Progressing   Problem: Activity: Goal: Risk for activity intolerance will decrease Outcome: Progressing   Problem: Nutrition: Goal: Adequate nutrition will be maintained Outcome: Progressing   Problem: Coping: Goal: Level of anxiety will decrease Outcome: Progressing   Problem: Elimination: Goal: Will not experience complications related to bowel motility Outcome: Progressing Goal: Will not experience complications related to urinary retention Outcome: Progressing   Problem: Pain Managment: Goal: General experience of comfort will improve Outcome: Progressing   Problem: Safety: Goal: Ability to remain free from injury will improve Outcome:  Progressing   Problem: Skin Integrity: Goal: Risk for impaired skin integrity will decrease Outcome: Progressing  - Scheduled for flex sigmoidoscopy and colonoscopy today. Pre op instructions given, pt verbalized understanding, placed on NPO p MN as ordered, bowel prep noted blood tinged, light brown stool. Will continue to monitor.

## 2020-02-23 NOTE — Anesthesia Postprocedure Evaluation (Signed)
Anesthesia Post Note  Patient: [name] [name]  Procedure(s) Performed: COLONOSCOPY possible flex sig only (N/A ) BIOPSY     Patient location during evaluation: PACU Anesthesia Type: MAC Level of consciousness: awake and alert Pain management: pain level controlled Vital Signs Assessment: post-procedure vital signs reviewed and stable Respiratory status: spontaneous breathing Cardiovascular status: stable Anesthetic complications: no   No complications documented.  Last Vitals:  Vitals:   02/23/20 1225 02/23/20 1256  BP: 108/62 (!) 120/50  Pulse: 65 69  Resp: 18 18  Temp:  (!) 36.4 C  SpO2: 99%     Last Pain:  Vitals:   02/23/20 1256  TempSrc: Oral  PainSc:                  Nolon Nations

## 2020-02-23 NOTE — Progress Notes (Signed)
[name] [name] 11:17 AM  Subjective: Patient tolerated the prep well but still is seeing a little bright red blood and no other complaints  Objective: Vital signs stable afebrile exam please see preassessment evaluation hemoglobin minimal drop  Assessment: Lower GI bleeding  Plan: Okay for flex sig possible colonoscopy with anesthesia assistance  [facility]  office [phone] After 5PM or if no answer call [phone]

## 2020-02-23 NOTE — Anesthesia Procedure Notes (Signed)
Date/Time: 02/23/2020 11:21 AM Performed by: Talbot Grumbling, CRNA Oxygen Delivery Method: Simple face mask

## 2020-02-23 NOTE — Anesthesia Preprocedure Evaluation (Addendum)
Anesthesia Evaluation  Patient identified by MRN, date of birth, ID band Patient awake    Reviewed: Allergy & Precautions, NPO status , Patient's Chart, lab work & pertinent test results, reviewed documented beta blocker date and time   Airway Mallampati: II  TM Distance: >3 FB Neck ROM: Full    Dental  (+) Missing, Dental Advisory Given, Teeth Intact   Pulmonary former smoker,    Pulmonary exam normal breath sounds clear to auscultation       Cardiovascular hypertension, On Medications + CAD, + Peripheral Vascular Disease and +CHF  + dysrhythmias Atrial Fibrillation + pacemaker + Valvular Problems/Murmurs  Rhythm:Irregular Rate:Normal  Echo 08/2019 1. Left ventricular ejection fraction, by estimation, is 55 to 60%. The left ventricle has normal function. The left ventricle has no regional wall motion abnormalities. Left ventricular diastolic function could not be evaluated. Left ventricular diastolic function could not be evaluated.  2. Right ventricular systolic function is normal. The right ventricular size is mildly enlarged. There is mildly elevated pulmonary artery systolic pressure. The estimated right ventricular systolic pressure is 16.1 mmHg.  3. Left atrial size was severely dilated.  4. The mitral valve is grossly normal. Mild mitral valve regurgitation. No evidence of mitral stenosis.  5. Tricuspid valve regurgitation is mild to moderate.  6. The aortic valve is tricuspid. Aortic valve regurgitation is not visualized. Mild to moderate aortic valve sclerosis/calcification is present, without any evidence of aortic stenosis.  7. Aneurysm of the ascending aorta, measuring 46 mm.  8. The inferior vena cava is normal in size with greater than 50% respiratory variability, suggesting right atrial pressure of 3 mmHg.   Pacemaker interrogation 01/2020 VVIR, 6 year battery longevity, 67.4% VS, 32.6% VP.   Neuro/Psych   Headaches, CVA    GI/Hepatic (+) Hepatitis -  Endo/Other  Hypothyroidism   Renal/GU Renal disease     Musculoskeletal   Abdominal   Peds  Hematology  (+) anemia ,   Anesthesia Other Findings   Reproductive/Obstetrics                          Anesthesia Physical  Anesthesia Plan  ASA: III  Anesthesia Plan: MAC   Post-op Pain Management:    Induction: Intravenous  PONV Risk Score and Plan: 2 and Propofol infusion, TIVA and Treatment may vary due to age or medical condition  Airway Management Planned: Natural Airway  Additional Equipment: None  Intra-op Plan:   Post-operative Plan:   Informed Consent: I have reviewed the patients History and Physical, chart, labs and discussed the procedure including the risks, benefits and alternatives for the proposed anesthesia with the patient or authorized representative who has indicated his/her understanding and acceptance.   Patient has DNR.  Discussed DNR with patient and Suspend DNR.   Dental advisory given  Plan Discussed with: CRNA  Anesthesia Plan Comments:        Anesthesia Quick Evaluation

## 2020-02-24 ENCOUNTER — Encounter (HOSPITAL_COMMUNITY): Payer: Self-pay | Admitting: Gastroenterology

## 2020-02-24 LAB — PREPARE RBC (CROSSMATCH)

## 2020-02-24 LAB — CBC
HCT: 22.7 % — ABNORMAL LOW (ref 36.0–46.0)
Hemoglobin: 7 g/dL — ABNORMAL LOW (ref 12.0–15.0)
MCH: 30 pg (ref 26.0–34.0)
MCHC: 30.8 g/dL (ref 30.0–36.0)
MCV: 97.4 fL (ref 80.0–100.0)
Platelets: 177 10*3/uL (ref 150–400)
RBC: 2.33 MIL/uL — ABNORMAL LOW (ref 3.87–5.11)
RDW: 14.8 % (ref 11.5–15.5)
WBC: 6.7 10*3/uL (ref 4.0–10.5)
nRBC: 0 % (ref 0.0–0.2)

## 2020-02-24 LAB — BASIC METABOLIC PANEL
Anion gap: 5 (ref 5–15)
BUN: 12 mg/dL (ref 8–23)
CO2: 23 mmol/L (ref 22–32)
Calcium: 8.8 mg/dL — ABNORMAL LOW (ref 8.9–10.3)
Chloride: 112 mmol/L — ABNORMAL HIGH (ref 98–111)
Creatinine, Ser: 0.91 mg/dL (ref 0.44–1.00)
GFR calc Af Amer: >60 mL/min (ref >60)
GFR calc non Af Amer: 57 mL/min — ABNORMAL LOW (ref >60)
Glucose, Bld: 108 mg/dL — ABNORMAL HIGH (ref 70–99)
Potassium: 4.3 mmol/L (ref 3.5–5.1)
Sodium: 140 mmol/L (ref 135–145)

## 2020-02-24 LAB — IRON AND TIBC
Iron: 65 ug/dL (ref 28–170)
Saturation Ratios: 15 % (ref 10.4–31.8)
TIBC: 443 ug/dL (ref 250–450)
UIBC: 378 ug/dL

## 2020-02-24 LAB — VITAMIN B12: Vitamin B-12: 765 pg/mL (ref 180–914)

## 2020-02-24 LAB — FOLATE: Folate: 14 ng/mL (ref >5.9)

## 2020-02-24 LAB — FERRITIN: Ferritin: 12 ng/mL (ref 11–307)

## 2020-02-24 LAB — RETICULOCYTES
Immature Retic Fract: 30.6 % — ABNORMAL HIGH (ref 2.3–15.9)
RBC.: 2.79 MIL/uL — ABNORMAL LOW (ref 3.87–5.11)
Retic Count, Absolute: 64.2 10*3/uL (ref 19.0–186.0)
Retic Ct Pct: 2.3 % (ref 0.4–3.1)

## 2020-02-24 LAB — SURGICAL PATHOLOGY

## 2020-02-24 MED ORDER — SODIUM CHLORIDE 0.9% IV SOLUTION: Freq: Once | INTRAVENOUS | Status: AC

## 2020-02-24 MED ORDER — ACETAMINOPHEN 325 MG PO TABS
650.0000 mg | ORAL_TABLET | Freq: Once | ORAL | Status: AC
  Administered 2020-02-24: 650 mg via ORAL
  Filled 2020-02-24: qty 2

## 2020-02-24 MED ORDER — FUROSEMIDE 10 MG/ML IJ SOLN
20.0000 mg | Freq: Once | INTRAMUSCULAR | Status: AC
  Administered 2020-02-24: 20 mg via INTRAVENOUS
  Filled 2020-02-24: qty 2

## 2020-02-24 MED ORDER — DIPHENHYDRAMINE HCL 25 MG PO CAPS
25.0000 mg | ORAL_CAPSULE | Freq: Once | ORAL | Status: AC
  Administered 2020-02-24: 25 mg via ORAL
  Filled 2020-02-24: qty 1

## 2020-02-24 NOTE — Progress Notes (Signed)
[name] [name] 10:20 AM  Subjective: Patient without any colonoscopy problems but with some bright red blood last night a long talk with her and her husband about her hemorrhoids and we answered all of her questions and she has no new complaints and her case discussed yesterday with the hospital team  Objective: Vital signs stable afebrile no acute distress abdomen is soft nontender hemoglobin slight drop  Assessment: Bleeding from hemorrhoids  Plan: I have talked to the surgery PA who will see the patient later today and decide on various hemorrhoidal therapies from their standpoint probably with Dr. Marcello Moores and she probably needs a transfusion before she goes home which I discussed with them and please call us if we could be of any further assistance with this hospital stay otherwise we will follow up biopsies next week with them on the phone  [facility]  office [phone] After 5PM or if no answer call [phone]

## 2020-02-24 NOTE — Progress Notes (Signed)
PROGRESS NOTE    [name] [name]  JIR:[id number] DOB: 04/20/1931 DOA: 02/21/2020 PCP: [name], MD  Brief Narrative:  29 home dwelling white female Chronic atrial fibrillation status post pacemaker on Eliquis Thoracic aortic aneurysm followed by Dr. Servando Snare since 2012 Intracranial hemorrhage parietal 2/2 HTN and Coumadin 2016-reversed with Kcentra Left heart cath 11/19/2017 noncritical CAD-followed by Dr. Alvester Chou Hyperthyroidism on methimazole/prednisone HTN HFpEF Prior nephrolithiasis status post right double-J stenting 7510 Nonalcoholic steatohepatitis noted in 2012 Admitted with rectal bleeding X1 day?  Secondary to hemorrhoids-seen by gastroenterology Dr. May God  Assessment & Plan:   Principal Problem:   Acute lower GI bleeding Active Problems:   Atrial fibrillation (HCC)   Chronic diastolic heart failure (HCC)   Hypothyroidism   CAD in native artery   Normocytic anemia   Lower GI bleed   Acute blood loss anemia   1. ?  Hemorrhoidal bleed a. Status post colonoscopy showing 4 diminutive polyps and internal and external hemorrhoids with bleeding b. Eliquis to be held temporarily-D/W8/11 neurology Dr. Erlinda Hong who agrees that benefit of anticoagulation protection from secondary strokes outweighs the risk of minor although irritating bleeding c. Continue Anusol suppositories and good bowel regimen over the next several weeks to prevent the risk of bleeding and straining d. Family is agreeable to the risk-benefit discussion as above with regards to resuming Eliquis cautiously on discharge in the next 2 days to promote healing of her hemorrhoids e. Dr. May God discussed with general surgery who saw the patient and felt patient could be followed as an outpatient 2. Permanent A. fib CHADS2 score 6-anticoagulation held-PPM in place a. Eliquis held as above-based on my discussion as above we will resume probably on 8/14 at home and give a chance for some healing to occur b. Continue  Cardizem 120 twice daily, labetalol 300 daily 3. HTN a. Resume losartan at a lower dose of 12.5 daily given mild hypotension b. Would hold Aldactone as above 4. Prior intracranial hemorrhage a. See above discussion 5. Hyperthyroidism on methimazole in the past with current hypothyroidism a. Continue 25 mcg daily of Synthroid b. TSH in several weeks 6. Chronic HFpEF without mention of acute diastolic failure a. Losartan as above 7. CAD history 8. Steatohepatitis  DVT prophylaxis: SCD's Code Status: FULL Family Communication: Long discussion with her daughter Philmore Pali who is a nurse who understands the issues surrounding patient's care Disposition:   Status is: Inpatient  Remains inpatient appropriate because:Unsafe d/c plan and IV treatments appropriate due to intensity of illness or inability to take PO   Dispo: The patient is from: Home              Anticipated d/c is to: Home              Anticipated d/c date is: 1 day              Patient currently is not medically stable to d/c.       Consultants:   gi  Procedures: colonoscopy 8/11  Antimicrobials:     Subjective: Pleasant alert no distress Husband at bedside She is getting blood as we speak She is anxious to go home but understands the risks  Objective: Vitals:   02/22/20 0500 02/24/20 1136 02/24/20 1203 02/24/20 1423  BP:  (!) 136/53 127/69 (!) 91/45  Pulse:  71 73 64  Resp:  16 18 18   Temp:  (!) 97.4 F (36.3 C) 98 F (36.7 C) 97.8 F (36.6 C)  TempSrc:  Oral Oral Oral  SpO2:  100% 100% 100%  Weight: 69.7 kg     Height:        Intake/Output Summary (Last 24 hours) at 02/24/2020 1428 Last data filed at 02/24/2020 0600 Gross per 24 hour  Intake 180 ml  Output 0 ml  Net 180 ml   Filed Weights   02/21/20 1842 02/21/20 2300 02/22/20 0500  Weight: 68.9 kg 68.9 kg 69.7 kg    Examination:  General exam: EOMI NCAT no focal deficit no pallor no icterus Respiratory system: CTA B no added  sound no rales no rhonchi Cardiovascular system: S1-S2 no murmur rub or gallop Gastrointestinal system: Soft no rebound no guarding. Central nervous system: Neurologically intact no focal deficit Extremities: No lower extremity edema ROM to joints intact Skin: No lower extremity edema as above Psychiatry: Pleasant euthymic  Data Reviewed: I have personally reviewed following labs and imaging studies   Radiology Studies: No results found. Hemoglobin 7.0 and getting 1 unit of blood today BUN/creatinine 12/0.9  Scheduled Meds: . diltiazem  120 mg Oral BID  . furosemide  20 mg Intravenous Once  . hydrocortisone   Rectal TID  . hydrocortisone  25 mg Rectal QHS,MR X 1  . labetalol  300 mg Oral Daily  . labetalol  450 mg Oral QHS  . levothyroxine  25 mcg Oral Q0600  . losartan  12.5 mg Oral Daily  . pneumococcal 23 valent vaccine  0.5 mL Intramuscular Tomorrow-1000  . polyethylene glycol  17 g Oral QID  . sodium chloride flush  3 mL Intravenous Q12H  . sodium chloride flush  3 mL Intravenous Q12H   Continuous Infusions: . sodium chloride       LOS: 2 days    Time spent: [place], MD [facility] To contact the attending provider between 7A-7P or the covering provider during after hours 7P-7A, please log into the web site [email or web address] and access using universal [place] password for that web site. If you do not have the password, please call the hospital operator.  02/24/2020, 2:28 PM

## 2020-02-24 NOTE — Consult Note (Signed)
[facility] Surgery Consult Note  [name] [name] 1931-05-21  [id number].    Requesting MD: Clarene Essex Chief Complaint: bright red rectal bleeding Reason for Consult: Bleeding hemorrhoids  HPI:  Patient is an 86 year old female who presented to the ED 02/21/2020, with 1 day of rectal bleeding.  There is was no nausea or vomiting prior to the onset of bleeding.  She attempts to control the bleeding with compression without success.  She has had prior episodes of hemorrhoidal bleeding.  She has a history of atrial fibrillation and is on Eliquis, chronic diastolic congestive heart failure, hypothyroid, Hx CVA, Hx hypertension, Hx hyperlipidemia, Hx intracranial hemorrhage 2016, Hx NASH, permanent transvenous pacemaker, Hx polymyalgia rheumatica, multiple medical allergies.  Hospital course: Patient was admitted she was afebrile vital signs were stable.  CMP was unremarkable except for glucose of 101, albumin 3.4, total protein 5.7.  Initial hemoglobin 9.1, hematocrit 28.6, platelets 263,000.  Fecal occult was positive.  Follow-up H/H showed progressive decline, her hemoglobin is 7.0/22.7 this AM.  She is scheduled for transfusion later today. Her Eliquis has been held since admission 02/21/20. She was seen by Dr. Watt Climes and underwent colonoscopy yesterday.  This showed:External and internal hemorrhoids were found during retroflexion, during perianal exam and during digital exam. The hemorrhoids were medium-sized. There were some small clots seen on the hemorrhoids Four semi-sessile polyps were found in the descending colon, transverse colon and ascending colon. The polyps were small in size. These were biopsied with a cold forceps for histology. A single small-mouthed diverticulum was found in the ascending colon. The exam was otherwise without abnormality. Without any blood seen in the entire colon. - External and internal hemorrhoids. - Four small polyps in the descending colon, in the transverse  colon and in the ascending colon. Biopsied. - Diverticulosis in the ascending colon. - The examination was otherwise normal. Dr. Watt Climes has seen and she will be transfused today.  He has placed her on hydrocortisone suppositories, and Miralax. She already has a follow up appointment with Dr. Marcello Moores in the office.  She has already been seen once for this by Dr. Marcello Moores. We are asked to see while she is still in the hospital.   ROS: Review of Systems  Constitutional: Negative.   HENT: Positive for hearing loss (not as good as before).   Eyes: Negative.   Respiratory: Positive for cough (occasional dry cough).   Cardiovascular: Negative.   Gastrointestinal: Positive for blood in stool and melena.  Genitourinary: Negative.   Musculoskeletal: Negative.   Skin: Negative.   Neurological:       Has vertigo and lies in bed with her head up.  Endo/Heme/Allergies: Negative for environmental allergies and polydipsia. Bruises/bleeds easily.       Easy brusing all over  Psychiatric/Behavioral: Positive for memory loss (some memory loss).    Family History  Problem Relation Age of Onset  . Coronary artery disease Mother   . Coronary artery disease Other   . Alzheimer's disease Other   . Coronary artery disease Brother   . Coronary artery disease Brother     Past Medical History:  Diagnosis Date  . CAD (coronary artery disease)    a. LHC 10/2017 30% RCA otherwise OK.  . Chronic diastolic CHF (congestive heart failure) ([place])   . Hemorrhoids    current problem as of 04/26/16  with slight bleeding of hemorrhoids   . Hepatitis    hx of medication induced hepatitis (Vytorin per pt)  . History of  kidney stones   . HLD (hyperlipidemia)   . HTN (hypertension)   . Hx of echocardiogram    echo 1/13: EF 55%, Gr 2 diast dysfn, Asc Ao aneurysm 4.8 cm, mild MR, mod LAE, mild RAE  . Hypothyroidism   . ICH (intracerebral hemorrhage) ([place]) 03/24/15  . Lower extremity edema   . NASH (nonalcoholic  steatohepatitis)   . Neuromuscular disorder ([place])   . Pacemaker-Medtronic 11/12/2011   Implanted 2013   . Permanent atrial fibrillation ([place])   . PMR (polymyalgia rheumatica) ([facility])    no steriods for 5 years  . sinus node dysfunction//post termination pauses   . Stroke ([place])   . Thoracic aortic aneurysm (TAA) ([place])    a. followed by Dr. Servando Snare.  . Tricuspid regurgitation   . Urinary tract infection    Recurrent infections.     Past Surgical History:  Procedure Laterality Date  . BIOPSY  02/23/2020   Procedure: BIOPSY;  Surgeon: Clarene Essex, MD;  Location: [facility] ENDOSCOPY;  Service: Endoscopy;;  . CARDIAC CATHETERIZATION    . CARDIOVERSION  03/02/2012   Procedure: CARDIOVERSION;  Surgeon: Lelon Perla, MD;  Location: [facility] ENDOSCOPY;  Service: Cardiovascular;  Laterality: N/A;  . CARDIOVERSION  03/13/2012   Procedure: CARDIOVERSION;  Surgeon: Josue Hector, MD;  Location: [place];  Service: Cardiovascular;  Laterality: N/A;  . CARDIOVERSION  04/15/2012   Procedure: CARDIOVERSION;  Surgeon: [name], MD;  Location: [place];  Service: Cardiovascular;  Laterality: N/A;  . CARDIOVERSION N/A 06/07/2014   Procedure: CARDIOVERSION;  Surgeon: [name], MD;  Location: [facility] ENDOSCOPY;  Service: Cardiovascular;  Laterality: N/A;  . CARDIOVERSION N/A 08/18/2014   Procedure: CARDIOVERSION;  Surgeon: [name], MD;  Location: [place];  Service: Cardiovascular;  Laterality: N/A;  . CARDIOVERSION N/A 09/06/2015   Procedure: CARDIOVERSION;  Surgeon: Jerline Pain, MD;  Location: [place];  Service: Cardiovascular;  Laterality: N/A;  . COLONOSCOPY N/A 02/23/2020   Procedure: COLONOSCOPY possible flex sig only;  Surgeon: Clarene Essex, MD;  Location: [facility] ENDOSCOPY;  Service: Endoscopy;  Laterality: N/A;  . CYSTOSCOPY WITH STENT PLACEMENT Right 04/22/2016   Procedure: CYSTOSCOPY WITH RIGHT URETERAL STENT INSERTION;  Surgeon: [name], MD;  Location: [place];  Service:  Urology;  Laterality: Right;  . CYSTOSCOPY WITH URETEROSCOPY, STONE BASKETRY AND STENT PLACEMENT Right 04/30/2016   Procedure: CYSTOSCOPY WITH URETEROSCOPY, STENT PLACEMENT REMOVAL;  Surgeon: [name], MD;  Location: [facility];  Service: Urology;  Laterality: Right;  . HERNIA REPAIR    . INCISION AND DRAINAGE BREAST ABSCESS  1973  . LEFT HEART CATH AND CORONARY ANGIOGRAPHY N/A 10/28/2017   Procedure: LEFT HEART CATH AND CORONARY ANGIOGRAPHY;  Surgeon: Lorretta Harp, MD;  Location: [place] CV LAB;  Service: Cardiovascular;  Laterality: N/A;  . PACEMAKER INSERTION  Jan 2013  . PERMANENT PACEMAKER INSERTION N/A 07/29/2011   Procedure: PERMANENT PACEMAKER INSERTION;  Surgeon: [name], MD;  Location: [facility] CATH LAB;  Service: Cardiovascular;  Laterality: N/A;  . UMBILICAL HERNIA REPAIR  1950's    Social History:  reports that she quit smoking about 29 years ago. Her smoking use included cigarettes. She has never used smokeless tobacco. She reports that she does not drink alcohol and does not use drugs.  Allergies:  Allergies  Allergen Reactions  . Penicillins Anaphylaxis    Has patient had a PCN reaction causing immediate rash, facial/tongue/throat swelling, SOB or lightheadedness with hypotension: Yes Has patient had a PCN reaction causing  severe rash involving mucus membranes or skin necrosis: No Has patient had a PCN reaction that required hospitalization No Has patient had a PCN reaction occurring within the last 10 years: No If all of the above answers are "NO", then may proceed with Cephalosporin use.   . Statins Other (See Comments)    REACTION: elevated LFT's  . Tylenol [Acetaminophen] Other (See Comments)    If taken with Vytorin at risk for liver damage  . Adhesive [Tape] Other (See Comments)    Irritates skin  . Amiodarone Other (See Comments)    unk  . Quinolones Other (See Comments)    UNk  . Tape Other (See Comments)    Redness, Please use "paper" tape only.  .  Codeine Other (See Comments)    feel funny, head is fuzzy    Medications Prior to Admission  Medication Sig Dispense Refill  . acetaminophen (TYLENOL) 500 MG tablet Take 1,000 mg by mouth every 6 (six) hours as needed for mild pain.    . Ascorbic Acid (VITAMIN C) 1000 MG tablet Take 500 mg by mouth once a week.     . Calcium Carbonate-Vitamin D (CALTRATE 600+D PO) Take 1 tablet by mouth daily.     . cetirizine (ZYRTEC) 10 MG tablet Take 10 mg by mouth daily.     . Cholecalciferol (VITAMIN D3) 1000 UNITS tablet Take 1,000 Units by mouth daily.     . colesevelam (WELCHOL) 625 MG tablet Take 625-1,250 mg by mouth See admin instructions. Taking 1 tablet in the AM and 2 tabs (121m) in the evening    . diltiazem (CARDIZEM CD) 120 MG 24 hr capsule Take 1 capsule (120 mg total) by mouth 2 (two) times daily. 180 capsule 2  . ELIQUIS 5 MG TABS tablet TAKE 1 TABLET BY MOUTH TWICE DAILY 180 tablet 1  . furosemide (LASIX) 40 MG tablet Take 60 mg by mouth daily. Take 1.5 tablets (60) mg daily    . labetalol (NORMODYNE) 300 MG tablet TAKE 1 TABLET BY MOUTH IN THE MORNING AND 1 & 1/2 TABLET IN THE EVENING. Please make yearly appt with Dr. KCaryl Comesfor May before anymore refills. 1st attempt 225 tablet 1  . levothyroxine (SYNTHROID, LEVOTHROID) 25 MCG tablet Take 25 mcg by mouth daily before breakfast.    . losartan (COZAAR) 25 MG tablet Take 25 mg by mouth daily.    .Marland Kitchenspironolactone (ALDACTONE) 25 MG tablet Take 1 tablet (25 mg total) by mouth daily. 90 tablet 3  . furosemide (LASIX) 40 MG tablet Take 1.5 tablets (60 mg total) by mouth daily. 90 tablet 3  . losartan (COZAAR) 25 MG tablet Take 1 tablet (25 mg total) by mouth daily. 90 tablet 3    Blood pressure (!) 115/50, pulse 70, temperature 98.2 F (36.8 C), temperature source Oral, resp. rate 18, height 5' 4"  (1.626 m), weight 69.7 kg, SpO2 98 %. Physical Exam:  General: pleasant, WD, WN white female who is laying in bed in NAD HEENT: head is  normocephalic, atraumatic.  Sclera are noninjected.  PERRL.  Ears and nose without any masses or lesions.  Mouth is pink and moist Heart: regular, rate, and rhythm.  Normal s1,s2. No obvious murmurs, gallops, or rubs noted.  Palpable radial and pedal pulses bilaterally Lungs: CTAB, no wheezes, rhonchi, or rales noted.  Respiratory effort nonlabored Abd: soft, NT, ND, +BS, no masses, hernias, or organomegaly. She has an umbilical transverse scar.(hernia repair 1950's).  Currently no rectal bleeding. Multiple  external  hemorrhoids, without any current signs of bleeding.  I did not do a full rectal exam, she had one yesterday by Dr. Watt Climes.  MS: all 4 extremities are symmetrical with no cyanosis, clubbing, or edema. Multiple sites of brusing  Skin: warm and dry with no masses, lesions, or rashes, multiple areas of bruising lower legs, R>L Neuro: Cranial nerves 2-12 grossly intact, sensation is normal throughout Psych: A&Ox3 with an appropriate affect.   Results for orders placed or performed during the hospital encounter of 02/21/20 (from the past 48 hour(s))  Hemoglobin     Status: Abnormal   Collection Time: 02/22/20  3:33 PM  Result Value Ref Range   Hemoglobin 8.1 (L) 12.0 - 15.0 g/dL    Comment: Performed at [facility], [place] [address], [place], [place] [address]  Hematocrit     Status: Abnormal   Collection Time: 02/22/20  3:33 PM  Result Value Ref Range   HCT 26.3 (L) 36 - 46 %    Comment: Performed at [facility], [place] [address] [place], [place] [address]  Hemoglobin     Status: Abnormal   Collection Time: 02/23/20 12:46 AM  Result Value Ref Range   Hemoglobin 7.4 (L) 12.0 - 15.0 g/dL    Comment: Performed at [facility], [place] [address] [place], [place] [address]  Hematocrit     Status: Abnormal   Collection Time: 02/23/20 12:46 AM  Result Value Ref Range   HCT 23.2 (L) 36 - 46 %    Comment: Performed at [facility], [place] [address] [place], [place] [address]  Basic metabolic panel     Status: Abnormal   Collection Time: 02/23/20 12:46 AM  Result Value Ref Range   Sodium 139 135 - 145 mmol/L   Potassium 4.2 3.5 - 5.1 mmol/L   Chloride 109 98 - 111 mmol/L   CO2 24 22 - 32 mmol/L   Glucose, Bld 98 70 - 99 mg/dL    Comment: Glucose reference range applies only to samples taken after fasting for at least 8 hours.   BUN 10 8 - 23 mg/dL   Creatinine, Ser 0.84 0.44 - 1.00 mg/dL   Calcium 8.7 (L) 8.9 - 10.3 mg/dL   GFR calc non Af Amer >60 >60 mL/min   GFR calc Af Amer >60 >60 mL/min   Anion gap 6 5 - 15    Comment: Performed at [facility], [place] [address], [place], [place] [address]  Hemoglobin     Status: Abnormal   Collection Time: 02/23/20  7:34 AM  Result Value Ref Range   Hemoglobin 7.5 (L) 12.0 - 15.0 g/dL    Comment: Performed at [facility], [place] [address] [place], [place] [address]  Hematocrit     Status: Abnormal   Collection Time: 02/23/20  7:34 AM  Result Value Ref Range   HCT 24.3 (L) 36 - 46 %    Comment: Performed at [facility], [place] [address], [place], [place] [address]  Basic metabolic panel     Status: Abnormal   Collection Time: 02/24/20  5:25 AM  Result Value Ref Range   Sodium 140 135 - 145 mmol/L   Potassium 4.3 3.5 - 5.1 mmol/L   Chloride 112 (H) 98 - 111 mmol/L   CO2 23 22 - 32 mmol/L   Glucose, Bld 108 (H) 70 - 99 mg/dL    Comment: Glucose reference  range applies only to samples taken after fasting for at least 8 hours.   BUN 12 8 - 23 mg/dL   Creatinine, Ser 0.91 0.44 - 1.00 mg/dL   Calcium 8.8 (L) 8.9 - 10.3 mg/dL   GFR calc non Af Amer 57 (L) >60 mL/min   GFR calc Af Amer >60 >60 mL/min   Anion gap 5 5 - 15    Comment: Performed at [facility], [place] [address] [place], [place] [address]  CBC     Status: Abnormal   Collection Time: 02/24/20  5:25 AM  Result Value  Ref Range   WBC 6.7 4.0 - 10.5 K/uL   RBC 2.33 (L) 3.87 - 5.11 MIL/uL   Hemoglobin 7.0 (L) 12.0 - 15.0 g/dL   HCT 22.7 (L) 36 - 46 %   MCV 97.4 80.0 - 100.0 fL   MCH 30.0 26.0 - 34.0 pg   MCHC 30.8 30.0 - 36.0 g/dL   RDW 14.8 11.5 - 15.5 %   Platelets 177 150 - 400 K/uL   nRBC 0.0 0.0 - 0.2 %    Comment: Performed at [facility], [place] [address], [place], [place] [address]  Prepare RBC (crossmatch)     Status: None   Collection Time: 02/24/20  8:40 AM  Result Value Ref Range   Order Confirmation      ORDER PROCESSED BY BLOOD BANK Performed at [place] [address] [place], [place] [address]    No results found. . sodium chloride       Assessment/Plan Hx atrial fibrillation on Eliquis Anemia secondary to rectal bleeding - being transfused today Chronic diastolic congestive heart failure Permanent transvenous pacemaker Hypothyroid Hx CVA Hx hypertension Hx hyperlipidemia Hx intracranial hemorrhage Hx NASH Hx polymyalgia rheumatica Multiple medical allergies  Bleeding per rectum secondary to hemorrhoids  FEN: IV fluids/soft diet ID: None DVT: Eliquis on hold for bleeding  Plan: Agree with medical management.  Dr. Marcello Moores has reviewed the colonoscopy and sees no need for acute surgical intervention.  She can follow-up in the office as previously scheduled.     Earnstine Regal [facility] Surgery 02/24/2020, 10:21 AM Please see Amion for pager number during day hours 7:00am-4:30pm

## 2020-02-25 LAB — CBC WITH DIFFERENTIAL/PLATELET
Abs Immature Granulocytes: 0.03 10*3/uL (ref 0.00–0.07)
Basophils Absolute: 0 10*3/uL (ref 0.0–0.1)
Basophils Relative: 0 %
Eosinophils Absolute: 0.1 10*3/uL (ref 0.0–0.5)
Eosinophils Relative: 2 %
HCT: 24.7 % — ABNORMAL LOW (ref 36.0–46.0)
Hemoglobin: 7.8 g/dL — ABNORMAL LOW (ref 12.0–15.0)
Immature Granulocytes: 1 %
Lymphocytes Relative: 14 %
Lymphs Abs: 0.8 10*3/uL (ref 0.7–4.0)
MCH: 30 pg (ref 26.0–34.0)
MCHC: 31.6 g/dL (ref 30.0–36.0)
MCV: 95 fL (ref 80.0–100.0)
Monocytes Absolute: 0.7 10*3/uL (ref 0.1–1.0)
Monocytes Relative: 12 %
Neutro Abs: 4.1 10*3/uL (ref 1.7–7.7)
Neutrophils Relative %: 71 %
Platelets: 175 10*3/uL (ref 150–400)
RBC: 2.6 MIL/uL — ABNORMAL LOW (ref 3.87–5.11)
RDW: 15.1 % (ref 11.5–15.5)
WBC: 5.8 10*3/uL (ref 4.0–10.5)
nRBC: 0 % (ref 0.0–0.2)

## 2020-02-25 LAB — BASIC METABOLIC PANEL
Anion gap: 7 (ref 5–15)
BUN: 9 mg/dL (ref 8–23)
CO2: 22 mmol/L (ref 22–32)
Calcium: 8.6 mg/dL — ABNORMAL LOW (ref 8.9–10.3)
Chloride: 110 mmol/L (ref 98–111)
Creatinine, Ser: 0.79 mg/dL (ref 0.44–1.00)
GFR calc Af Amer: >60 mL/min (ref >60)
GFR calc non Af Amer: >60 mL/min (ref >60)
Glucose, Bld: 89 mg/dL (ref 70–99)
Potassium: 3.8 mmol/L (ref 3.5–5.1)
Sodium: 139 mmol/L (ref 135–145)

## 2020-02-25 LAB — TYPE AND SCREEN
ABO/RH(D): A POS
Antibody Screen: NEGATIVE
Unit division: 0

## 2020-02-25 LAB — BPAM RBC
Blood Product Expiration Date: 202109062359
ISSUE DATE / TIME: 202108121143
Unit Type and Rh: 6200

## 2020-02-25 MED ORDER — APIXABAN 5 MG PO TABS: 5.0000 mg | ORAL_TABLET | Freq: Two times a day (BID) | ORAL | 1 refills | Status: DC

## 2020-02-25 MED ORDER — POLYETHYLENE GLYCOL 3350 17 G PO PACK: 17.0000 g | PACK | Freq: Four times a day (QID) | ORAL | 0 refills | Status: DC

## 2020-02-25 MED ORDER — HYDROCORTISONE ACETATE 25 MG RE SUPP: 25.0000 mg | Freq: Every evening | RECTAL | 0 refills | Status: DC | PRN

## 2020-02-25 MED ORDER — HYDROCORTISONE (PERIANAL) 2.5 % EX CREA: TOPICAL_CREAM | Freq: Three times a day (TID) | CUTANEOUS | 0 refills | Status: DC

## 2020-02-25 MED ORDER — LOSARTAN POTASSIUM 25 MG PO TABS: 12.5000 mg | ORAL_TABLET | Freq: Every day | ORAL | Status: DC

## 2020-02-25 NOTE — Discharge Instructions (Signed)
Hemorrhoids Hemorrhoids are swollen veins that may develop:  In the butt (rectum). These are called internal hemorrhoids.  Around the opening of the butt (anus). These are called external hemorrhoids. Hemorrhoids can cause pain, itching, or bleeding. Most of the time, they do not cause serious problems. They usually get better with diet changes, lifestyle changes, and other home treatments. What are the causes? This condition may be caused by:  Having trouble pooping (constipation).  Pushing hard (straining) to poop.  Watery poop (diarrhea).  Pregnancy.  Being very overweight (obese).  Sitting for long periods of time.  Heavy lifting or other activity that causes you to strain.  Anal sex.  Riding a bike for a long period of time. What are the signs or symptoms? Symptoms of this condition include:  Pain.  Itching or soreness in the butt.  Bleeding from the butt.  Leaking poop.  Swelling in the area.  One or more lumps around the opening of your butt. How is this diagnosed? A doctor can often diagnose this condition by looking at the affected area. The doctor may also:  Do an exam that involves feeling the area with a gloved hand (digital rectal exam).  Examine the area inside your butt using a small tube (anoscope).  Order blood tests. This may be done if you have lost a lot of blood.  Have you get a test that involves looking inside the colon using a flexible tube with a camera on the end (sigmoidoscopy or colonoscopy). How is this treated? This condition can usually be treated at home. Your doctor may tell you to change what you eat, make lifestyle changes, or try home treatments. If these do not help, procedures can be done to remove the hemorrhoids or make them smaller. These may involve:  Placing rubber bands at the base of the hemorrhoids to cut off their blood supply.  Injecting medicine into the hemorrhoids to shrink them.  Shining a type of light  energy onto the hemorrhoids to cause them to fall off.  Doing surgery to remove the hemorrhoids or cut off their blood supply. Follow these instructions at home: Eating and drinking   Eat foods that have a lot of fiber in them. These include whole grains, beans, nuts, fruits, and vegetables.  Ask your doctor about taking products that have added fiber (fibersupplements).  Reduce the amount of fat in your diet. You can do this by: ? Eating low-fat dairy products. ? Eating less red meat. ? Avoiding processed foods.  Drink enough fluid to keep your pee (urine) pale yellow. Managing pain and swelling   Take a warm-water bath (sitz bath) for 20 minutes to ease pain. Do this 3-4 times a day. You may do this in a bathtub or using a portable sitz bath that fits over the toilet.  If told, put ice on the painful area. It may be helpful to use ice between your warm baths. ? Put ice in a plastic bag. ? Place a towel between your skin and the bag. ? Leave the ice on for 20 minutes, 2-3 times a day. General instructions  Take over-the-counter and prescription medicines only as told by your doctor. ? Medicated creams and medicines may be used as told.  Exercise often. Ask your doctor how much and what kind of exercise is best for you.  Go to the bathroom when you have the urge to poop. Do not wait.  Avoid pushing too hard when you poop.  Keep your  butt dry and clean. Use wet toilet paper or moist towelettes after pooping.  Do not sit on the toilet for a long time.  Keep all follow-up visits as told by your doctor. This is important. Contact a doctor if you:  Have pain and swelling that do not get better with treatment or medicine.  Have trouble pooping.  Cannot poop.  Have pain or swelling outside the area of the hemorrhoids. Get help right away if you have:  Bleeding that will not stop. Summary  Hemorrhoids are swollen veins in the butt or around the opening of the  butt.  They can cause pain, itching, or bleeding.  Eat foods that have a lot of fiber in them. These include whole grains, beans, nuts, fruits, and vegetables.  Take a warm-water bath (sitz bath) for 20 minutes to ease pain. Do this 3-4 times a day. This information is not intended to replace advice given to you by your health care provider. Make sure you discuss any questions you have with your health care provider. Document Revised: 07/09/2018 Document Reviewed: 11/20/2017 Elsevier Patient Education  [place]  The rectum is the last foot of your colon, and it naturally stretches to hold stool.  Hemorrhoidal piles are natural clusters of blood vessels that help the rectum and anal canal stretch to hold stool and allow bowel movements to eliminate feces.   Hemorrhoids are abnormally swollen blood vessels in the rectum.  Too much pressure in the rectum causes hemorrhoids by forcing blood to stretch and bulge the walls of the veins, sometimes even rupturing them.  Hemorrhoids can become like varicose veins you might see on a person's legs.  Most people will develop a flare of hemorrhoids in their lifetime.  When bulging hemorrhoidal veins are irritated, they can swell, burn, itch, cause pain, and bleed.  Most flares will calm down gradually own within a few weeks.  However, once hemorrhoids are created, they are difficult to get rid of completely and tend to flare more easily than the first flare.   Fortunately, good habits and simple medical treatment usually control hemorrhoids well, and surgery is needed only in severe cases. Types of Hemorrhoids:  Internal hemorrhoids usually don't initially hurt or itch; they are deep inside the rectum and usually have no sensation. If they begin to push out (prolapse), pain and burning can occur.  However, internal hemorrhoids can bleed.  Anal bleeding should not be ignored since bleeding could come from a dangerous source like  colorectal cancer, so persistent rectal bleeding should be investigated by a doctor, sometimes with a colonoscopy.  External hemorrhoids cause most of the symptoms - pain, burning, and itching. Nonirritated hemorrhoids can look like small skin tags coming out of the anus.   Thrombosed hemorrhoids can form when a hemorrhoid blood vessel bursts and causes the hemorrhoid to suddenly swell.  A purple blood clot can form in it and become an excruciatingly painful lump at the anus. Because of these unpleasant symptoms, immediate incision and drainage by a surgeon at an office visit can provide much relief of the pain.    PREVENTION Avoiding the most frequent causes listed below will prevent most cases of hemorrhoids: Constipation Hard stools Diarrhea  Constant sitting  Straining with bowel movements Sitting on the toilet for a long time  Severe coughing  episodes Pregnancy / Childbirth  Heavy Lifting  Sometimes avoiding the above triggers is difficult:  How can you avoid sitting all day  if you have a seated job? Also, we try to avoid coughing and diarrhea, but sometimes its beyond your control.  Still, there are some practical hints to help: Keep the anal and genital area clean.  Moistened tissues such as flushable wet wipes are less irritating than toilet paper.  Using irrigating showers or bottle irrigation washing gently cleans this sensitive area.   Avoid dry toilet paper when cleaning after bowel movements.  Marland Kitchen Keep the anal and genital area dry.  Lightly pat the rectal area dry.  Avoid rubbing.  Talcum or baby powders can help GET YOUR STOOLS SOFT.   This is the most important way to prevent irritated hemorrhoids.  Hard stools are like sandpaper to the anorectal canal and will cause more problems.  The goal: ONE SOFT BOWEL MOVEMENT A DAY!  BMs from every other day to 3 times a day is a tolerable range Treat coughing, diarrhea and constipation early since irritated hemorrhoids may soon follow.  If  your main job activity is seated, always stand or walk during your breaks. Make it a point to stand and walk at least 5 minutes every hour and try to shift frequently in your chair to avoid direct rectal pressure.  Always exhale as you strain or lift. Don't hold your breath.  Do not delay or try to prevent a bowel movement when the urge is present. Exercise regularly (walking or jogging 60 minutes a day) to stimulate the bowels to move. No reading or other activity while on the toilet. If bowel movements take longer than 5 minutes, you are too constipated. AVOID CONSTIPATION Drink plenty of liquids (1 1/2 to 2 quarts of water and other fluids a day unless fluid restricted for another medical condition). Liquids that contain caffeine (coffee a, tea, soft drinks) can be dehydrating and should be avoided until constipation is controlled. Consider minimizing milk, as dairy products may be constipating. Eat plenty of fiber (30g a day ideal, more if needed).  Fiber is the undigested part of plant food that passes into the colon, acting as natures broom to encourage bowel motility and movement.  Fiber can absorb and hold large amounts of water. This results in a larger, bulkier stool, which is soft and easier to pass.  Eating foods high in fiber - 12 servings - such as  Vegetables: Root (potatoes, carrots, turnips), Leafy green (lettuce, salad greens, celery, spinach), High residue (cabbage, broccoli, etc.) Fruit: Fresh, Dried (prunes, apricots, cherries), Stewed (applesauce)  Whole grain breads, pasta, whole wheat Bran cereals, muffins, etc. Consider adding supplemental bulking fiber which retains large volumes of water: Psyllium ground seeds (native plant from [place])--available as Metamucil, Konsyl, Effersyllium, Per Diem Fiber, or the less expensive generic forms.  Citrucel  (methylcellulose wood fiber) . FiberCon (Polycarbophil) Polyethylene Glycol - and artificial fiber commonly called  Miralax or Glycolax.  It is helpful for people with gassy or bloated feelings with regular fiber Flax Seed - a less gassy natural fiber  Laxatives can be useful for a short period if constipation is severe Osmotics (Milk of Magnesia, Fleets Phospho-Soda, Magnesium Citrate)  Stimulants (Senokot,   Castor Oil,  Dulcolax, Ex-Lax)    Laxatives are not a good long-term solution as it can stress the bowels and cause too much mineral loss and dehydration.   Avoid taking laxatives for more than 7 days in a row.  AVOID DIARRHEA Switch to liquids and simpler foods for a few days to avoid stressing your intestines further. Avoid dairy  products (especially milk & ice cream) for a short time.  The intestines often can lose the ability to digest lactose when stressed. Avoid foods that cause gassiness or bloating.  Typical foods include beans and other legumes, cabbage, broccoli, and dairy foods.  Every person has some sensitivity to other foods, so listen to your body and avoid those foods that trigger problems for you. Adding fiber (Citrucel, Metamucil, FiberCon, Flax seed, Miralax) gradually can help thicken stools by absorbing excess fluid and retrain the intestines to act more normally.  Slowly increase the dose over a few weeks.  Too much fiber too soon can backfire and cause cramping & bloating. Probiotics (such as active yogurt, Align, etc) may help repopulate the intestines and colon with normal bacteria and calm down a sensitive digestive tract.  Most studies show it to be of mild help, though, and such products can be costly. Medicines: Bismuth subsalicylate (ex. Kayopectate, Pepto Bismol) every 30 minutes for up to 6 doses can help control diarrhea.  Avoid if pregnant. Loperamide (Immodium) can slow down diarrhea.  Start with two tablets (16m total) first and then try one tablet every 6 hours.  Avoid if you are having fevers or severe pain.  If you are not better or start feeling worse, stop all  medicines and call your doctor for advice Call your doctor if you are getting worse or not better.  Sometimes further testing (cultures, endoscopy, X-ray studies, bloodwork, etc) may be needed to help diagnose and treat the cause of the diarrhea.  TROUBLESHOOTING IRREGULAR BOWELS 1) Avoid extremes of bowel movements (no bad constipation/diarrhea) 2) Miralax 17gm mixed in 8oz. water or juice-daily. May use BID as needed.  3) Gas-x,Phazyme, etc. as needed for gas & bloating.  4) Soft,bland diet. No spicy,greasy,fried foods.  5) Prilosec over-the-counter as needed  6) May hold gluten/wheat products from diet to see if symptoms improve.  7)  May try probiotics (Align, Activa, etc) to help calm the bowels down 7) If symptoms become worse call back immediately.   TREATMENT OF HEMORRHOID FLARE If these preventive measures fail, you must take action right away! Hemorrhoids are one condition that can be mild in the morning and become intolerable by nightfall. Most hemorrhoidal flares take several weeks to calm down.  These suggestions can help: Warm soaks.  This helps more than any topical medication.  Use up to 8 times a day.  Usually sitz baths or sitting in a warm bathtub helps.  Sitting on moist warm towels are helpful.  Switching to ice packs/cool compresses can be helpful  Use a Sitz Bath 4-8 times a day for relief A sitz bath is a warm water bath taken in the sitting position that covers only the hips and buttocks. It may be used for either healing or hygiene purposes. Sitz baths are also used to relieve pain, itching, or muscle spasms. The water may contain medicine. Moist heat will help you heal and relax.  HOME CARE INSTRUCTIONS  Take 3 to 4 sitz baths a day. 1. Fill the bathtub half full with warm water. 2. Sit in the water and open the drain a little. 3. Turn on the warm water to keep the tub half full. Keep the water running constantly. 4. Soak in the water for 15 to 20  minutes. 5. After the sitz bath, pat the affected area dry first. SEEK MEDICAL CARE IF:  You get worse instead of better. Stop the sitz baths if you get worse.  Normalize your bowels.  Extremes of diarrhea or constipation will make hemorrhoids worse.  One soft bowel movement a day is the goal.  Fiber can help get your bowels regular Wet wipes instead of toilet paper Pain control with a NSAID such as ibuprofen (Advil) or naproxen (Aleve) or acetaminophen (Tylenol) around the clock.  Narcotics are constipating and should be minimized if possible Topical creams contain steroids (bydrocortisone) or local anesthetic (xylocaine) can help make pain and itching more tolerable.   EVALUATION If hemorrhoids are still causing problems, you could benefit by an evaluation by a surgeon.  The surgeon will obtain a history and examine you.  If hemorrhoids are diagnosed, some therapies can be offered in the office, usually with an anoscope into the less sensitive area of the rectum: -injection of hemorrhoids (sclerotherapy) can scar the blood vessels of the swollen/enlarged hemorrhoids to help shrink them down to a more normal size -rubber banding of the enlarged hemorrhoids to help shrink them down to a more normal size -drainage of the blood clot causing a thrombosed hemorrhoid,  to relieve the severe pain   While 90% of the time such problems from hemorrhoids can be managed without preceding to surgery, sometimes the hemorrhoids require a operation to control the problem (uncontrolled bleeding, prolapse, pain, etc.).   This involves being placed under general anesthesia where the surgeon can confirm the diagnosis and remove, suture, or staple the hemorrhoid(s).  Your surgeon can help you treat the problem appropriately.

## 2020-02-25 NOTE — Care Management Important Message (Signed)
Important Message  Patient Details IM Letter given to the Patient Name: [name] [name] MRN: [id number] Date of Birth: 09-06-1930   Medicare Important Message Given:  Yes     [name] 02/25/2020, 10:26 AM

## 2020-02-25 NOTE — TOC Transition Note (Signed)
Transition of Care [facility]) - CM/SW Discharge Note   Patient Details  Name: [name] [name] MRN: [id number] Date of Birth: October 18, 1930  Transition of Care [facility]) CM/SW Contact:  Dessa Phi, RN Phone Number: 02/25/2020, 10:11 AM   Clinical Narrative:  D/c home w/[facility] HHPT-rep Ronalee Belts aware. No further CM needs.     Final next level of care: [place] Barriers to Discharge: No Barriers Identified   Patient Goals and CMS Choice Patient states their goals for this hospitalization and ongoing recovery are:: return home w/[facility] CMS Medicare.gov Compare Post Acute Care list provided to:: Patient Choice offered to / list presented to : Patient  Discharge Placement                       Discharge Plan and Services   Discharge Planning Services: CM Consult Post Acute Care Choice: Home Health                    HH Arranged: PT [place]: [facility] [employer] Date [place]: 02/25/20 Time [place]: [id number] Representative spoke with at [place]: [place] ([place]) Interventions     Readmission Risk Interventions No flowsheet data found.

## 2020-02-25 NOTE — Discharge Summary (Signed)
Physician Discharge Summary  [name] [name] WOE:[id number] DOB: 25-Oct-1930 DOA: 02/21/2020  PCP: [name], MD  Admit date: 02/21/2020 Discharge date: 02/25/2020  Time spent: 50 minutes  Recommendations for Outpatient Follow-up:  1. Look carefully at medications as they have changed to some degree at [facility] and multiple supplements have been discontinued 2. May need to be set up in the outpatient setting with iron infusions-iron studies this admission showed slightly low saturation ratios and she is somewhat at risk of worsening hemorrhoidal bleeding and constipation if she takes p.o. iron-I will forward to PCP or surrogate (Dr. Maudie Mercury is out on leave) to see if this can be arranged 3. Will need Chem-7 and CBC in about 1 week 4. Definitive management of hemorrhoidal bleeding per Dr. Marcello Moores who was consulted during this hospital stay and will be CCed 5.  requires TSH in about 3 week 6. I will CC Dr. On this patient's discharge  Discharge Diagnoses:  Principal Problem:   Acute lower GI bleeding Active Problems:   Atrial fibrillation (HCC)   Chronic diastolic heart failure (HCC)   Hypothyroidism   CAD in native artery   Normocytic anemia   Lower GI bleed   Acute blood loss anemia   Discharge Condition: Fair  Diet recommendation: Soft diet  Filed Weights   02/21/20 1842 02/21/20 2300 02/22/20 0500  Weight: 68.9 kg 68.9 kg 69.7 kg    History of present illness:  88 home dwelling white female Chronic atrial fibrillation status post pacemaker on Eliquis Thoracic aortic aneurysm followed by Dr. Servando Snare since 2012 Intracranial hemorrhage parietal 2/2 HTN and Coumadin 2016-reversed with Kcentra Left heart cath 11/19/2017 noncritical CAD-followed by Dr. Alvester Chou Hyperthyroidism on methimazole/prednisone HTN HFpEF Prior nephrolithiasis status post right double-J stenting 0037 Nonalcoholic steatohepatitis noted in 2012 Admitted with rectal bleeding X1 day?  Secondary to hemorrhoids-seen by  gastroenterology Dr. May God  eventually seen in addition by general surgeon Dr. Marcello Moores (she follows in the outpatient) because of hemorrhoidal bleeding and it was felt that options for hemorrhoidal bleeding could be taken care of in the outpatient setting with nonemergent nonacute large-volume bleeding   Hospital Course:  1.   Hemorrhoidal bleed from internal hemorrhoids a. Status post colonoscopy showing 4 diminutive polyps and internal and external hemorrhoids with bleeding b. Eliquis to be held temporarily-D/W8/11 neurology Dr. Erlinda Hong who agrees that benefit of anticoagulation protection from secondary strokes outweighs the risk of minor although irritating bleeding I have counseled the patient extensively over several days and spoken with her daughter 8/12 on phone who is a nurse----patient understands to resume her Eliquis 02/27/2020 for secondary prevention c. Continue Anusol suppositories and good bowel regimen over the next several weeks to prevent the risk of bleeding and straining d. Family is agreeable to the risk-benefit discussion as above with regards to resuming Eliquis cautiously on discharge in the next 2 days to promote healing of her hemorrhoids e. Dr. Marcello Moores and Dr.Magod will be CCed on this note to ensure not lost to follow-up as she does need definitive management in the outpatient setting 2. Acute blood loss anemia from hemorrhoids 1. Patient transfused 1 unit of PRBC 8/12 2. Iron studies showed a slightly low saturation ratio 15 and outpatient wise will need to be set up for iron infusions 3. I will make General surgery aware of the same as they will probably be seeing her the soonest [her PCP is out on medical leave so this may take some time to coordinate] 4. Will need  CBC in about 1 week 3. Permanent A. fib CHADS2 score 6-anticoagulation held-PPM in place a. Eliquis held as above-based on my discussion-resume 8/15 at home b. Continue Cardizem 120 twice daily, labetalol 300  daily 4. HTN a. Resume losartan at a lower dose of 12.5 daily given mild hypotension b. Would hold Aldactone as above and other diuretics on hold as well 5. Prior intracranial hemorrhage a. See above discussion 6. Hyperthyroidism on methimazole in the past with current hypothyroidism a. Continue 25 mcg daily of Synthroid b. TSH in several weeks 7. Chronic HFpEF without mention of acute diastolic failure a. Losartan as above 8. CAD history 9. Steatohepatitis  Procedures:  Colonoscopy s)  Consultations:  GI and general surgery consulted this admission as above  Discharge Exam: Vitals:   02/24/20 2051 02/25/20 0454  BP: 106/84 119/68  Pulse: 73 73  Resp: 18 20  Temp: 98.2 F (36.8 C) 98.2 F (36.8 C)  SpO2: 99% 98%    General: Awake alert coherent sitting at the bedside with her husband chatting no significant bleeding feels well Cardiovascular: S1-S2 PVCs on monitors underlying A. fib paced monitor Respiratory: Clear no added sound Abdomen soft no rebound no guarding No lower extremity edema Neurologically intact  Discharge Instructions   Discharge Instructions    Diet - low sodium heart healthy   Complete by: As directed    Discharge instructions   Complete by: As directed    You have had some bleeding likely from your hemorrhoids and you should follow-up with Dr. Marcello Moores as indicated and scheduled-there is no emergent need for hemorrhoid surgery as we have discussed You may have some bleeding but your hemoglobin was stable relatively during the hospital stay and I would not recommend that you stay off your Eliquis beyond 02/27/2020 I would like to start you on iron in the outpatient setting if you do not have bleeding as sometimes if you take iron your stools appear "dark" and this may be confused with actual bleeding--in addition iron is quite constipating and I do not want to add that as an issue to your bleeding hemorrhoids I will ask that your primary care  physician checks iron levels and may be if there is a need you can get IV iron as an outpatient set up through his office    Make sure you look at your medications carefully-we have stopped some of your fluid medications like Lasix and Aldactone because we felt you have a little bit of dehydration and this can be resumed in the outpatient setting Calcium is good for you but does cause constipation I have stopped it temporarily to allow for you to have regular stools in addition had stopped your WelChol and ascorbic acid and vitamin D as I feel you may be taking too many pills and this can be resumed once your issues with your hemorrhoids resolve-temporarily holding them will not harm you Notice that your losartan dosage has changed a little bit   Increase activity slowly   Complete by: As directed      Allergies as of 02/25/2020      Reactions   Penicillins Anaphylaxis   Has patient had a PCN reaction causing immediate rash, facial/tongue/throat swelling, SOB or lightheadedness with hypotension: Yes Has patient had a PCN reaction causing severe rash involving mucus membranes or skin necrosis: No Has patient had a PCN reaction that required hospitalization No Has patient had a PCN reaction occurring within the last 10 years: No If all of  the above answers are "NO", then may proceed with Cephalosporin use.   Statins Other (See Comments)   REACTION: elevated LFT's   Tylenol [acetaminophen] Other (See Comments)   If taken with Vytorin at risk for liver damage   Adhesive [tape] Other (See Comments)   Irritates skin   Amiodarone Other (See Comments)   unk   Quinolones Other (See Comments)   UNk   Tape Other (See Comments)   Redness, Please use "paper" tape only.   Codeine Other (See Comments)   feel funny, head is fuzzy      Medication List    STOP taking these medications   CALTRATE 600+D PO   cetirizine 10 MG tablet Commonly known as: ZYRTEC   cholecalciferol 25 MCG (1000 UNIT)  tablet Commonly known as: VITAMIN D   colesevelam 625 MG tablet Commonly known as: WELCHOL   furosemide 40 MG tablet Commonly known as: LASIX   spironolactone 25 MG tablet Commonly known as: ALDACTONE   vitamin C 1000 MG tablet     TAKE these medications   acetaminophen 500 MG tablet Commonly known as: TYLENOL Take 1,000 mg by mouth every 6 (six) hours as needed for mild pain.   apixaban 5 MG Tabs tablet Commonly known as: Eliquis Take 1 tablet (5 mg total) by mouth 2 (two) times daily. Start taking on: February 27, 2020 What changed:   how much to take  These instructions start on February 27, 2020. If you are unsure what to do until then, ask your doctor or other care provider.   diltiazem 120 MG 24 hr capsule Commonly known as: CARDIZEM CD Take 1 capsule (120 mg total) by mouth 2 (two) times daily.   hydrocortisone 2.5 % rectal cream Commonly known as: ANUSOL-HC Place rectally 3 (three) times daily.   hydrocortisone 25 MG suppository Commonly known as: ANUSOL-HC Place 1 suppository (25 mg total) rectally at bedtime and may repeat dose one time if needed.   labetalol 300 MG tablet Commonly known as: NORMODYNE TAKE 1 TABLET BY MOUTH IN THE MORNING AND 1 & 1/2 TABLET IN THE EVENING. Please make yearly appt with Dr. Caryl Comes for May before anymore refills. 1st attempt   levothyroxine 25 MCG tablet Commonly known as: SYNTHROID Take 25 mcg by mouth daily before breakfast.   losartan 25 MG tablet Commonly known as: COZAAR Take 0.5 tablets (12.5 mg total) by mouth daily. What changed:   how much to take  Another medication with the same name was removed. Continue taking this medication, and follow the directions you see here.   polyethylene glycol 17 g packet Commonly known as: MIRALAX / GLYCOLAX Take 17 g by mouth QID.      Allergies  Allergen Reactions  . Penicillins Anaphylaxis    Has patient had a PCN reaction causing immediate rash, facial/tongue/throat  swelling, SOB or lightheadedness with hypotension: Yes Has patient had a PCN reaction causing severe rash involving mucus membranes or skin necrosis: No Has patient had a PCN reaction that required hospitalization No Has patient had a PCN reaction occurring within the last 10 years: No If all of the above answers are "NO", then may proceed with Cephalosporin use.   . Statins Other (See Comments)    REACTION: elevated LFT's  . Tylenol [Acetaminophen] Other (See Comments)    If taken with Vytorin at risk for liver damage  . Adhesive [Tape] Other (See Comments)    Irritates skin  . Amiodarone Other (See Comments)  unk  . Quinolones Other (See Comments)    UNk  . Tape Other (See Comments)    Redness, Please use "paper" tape only.  . Codeine Other (See Comments)    feel funny, head is fuzzy      The results of significant diagnostics from this hospitalization (including imaging, microbiology, ancillary and laboratory) are listed below for reference.    Significant Diagnostic Studies: CUP PACEART REMOTE DEVICE CHECK  Result Date: 01/30/2020 Scheduled remote reviewed. Normal device function.  Programmed VVIR, perm AF. Next remote 91 days.     [facility]   Microbiology: Recent Results (from the past 240 hour(s))  SARS Coronavirus 2 by RT PCR (hospital order, performed in [facility] hospital lab) Nasopharyngeal Nasopharyngeal Swab     Status: None   Collection Time: 02/21/20  9:44 PM   Specimen: Nasopharyngeal Swab  Result Value Ref Range Status   SARS Coronavirus 2 NEGATIVE NEGATIVE Final    Comment: (NOTE) SARS-CoV-2 target nucleic acids are NOT DETECTED.  The SARS-CoV-2 RNA is generally detectable in upper and lower respiratory specimens during the acute phase of infection. The lowest concentration of SARS-CoV-2 viral copies this assay can detect is 250 copies / mL. A negative result does not preclude SARS-CoV-2 infection and should not be used as the sole basis for treatment  or other patient management decisions.  A negative result may occur with improper specimen collection / handling, submission of specimen other than nasopharyngeal swab, presence of viral mutation(s) within the areas targeted by this assay, and inadequate number of viral copies (<250 copies / mL). A negative result must be combined with clinical observations, patient history, and epidemiological information.  Fact Sheet for Patients:   StrictlyIdeas.no  Fact Sheet for Healthcare Providers: BankingDealers.co.za  This test is not yet approved or  cleared by the [place] FDA and has been authorized for detection and/or diagnosis of SARS-CoV-2 by FDA under an Emergency Use Authorization (EUA).  This EUA will remain in effect (meaning this test can be used) for the duration of the COVID-19 declaration under Section 564(b)(1) of the Act, 21 U.S.C. section 360bbb-3(b)(1), unless the authorization is terminated or revoked sooner.  Performed at [facility], [place] [address], [place], [place] [address]      Labs: Basic Metabolic Panel: Recent Labs  Lab 02/21/20 1849 02/22/20 0735 02/23/20 0046 02/24/20 0525 02/25/20 0523  NA 139 140 139 140 139  K 3.6 3.7 4.2 4.3 3.8  CL 107 108 109 112* 110  CO2 24 21* 24 23 22   GLUCOSE 101* 86 98 108* 89  BUN 15 13 10 12 9   CREATININE 0.97 0.92 0.84 0.91 0.79  CALCIUM 9.5 8.8* 8.7* 8.8* 8.6*   Liver Function Tests: Recent Labs  Lab 02/21/20 1849  AST 16  ALT 13  ALKPHOS 52  BILITOT 0.6  PROT 5.7*  ALBUMIN 3.4*   No results for input(s): LIPASE, AMYLASE in the last 168 hours. No results for input(s): AMMONIA in the last 168 hours. CBC: Recent Labs  Lab 02/21/20 1849 02/21/20 2342 02/22/20 1533 02/23/20 0046 02/23/20 0734 02/24/20 0525 02/25/20 0523  WBC 6.6  --   --   --   --  6.7 5.8  NEUTROABS  --   --   --   --   --   --  4.1  HGB 9.1*   < > 8.1* 7.4*  7.5* 7.0* 7.8*  HCT 28.6*   < > 26.3* 23.2* 24.3* 22.7* 24.7*  MCV 94.7  --   --   --   --  97.4 95.0  PLT 263  --   --   --   --  177 175   < > = values in this interval not displayed.   Cardiac Enzymes: No results for input(s): CKTOTAL, CKMB, CKMBINDEX, TROPONINI in the last 168 hours. BNP: BNP (last 3 results) No results for input(s): BNP in the last 8760 hours.  ProBNP (last 3 results) No results for input(s): PROBNP in the last 8760 hours.  CBG: No results for input(s): GLUCAP in the last 168 hours.     Signed:  Nita Sells MD   [facility] 02/25/2020, 9:50 AM

## 2020-02-29 DIAGNOSIS — E039 Hypothyroidism, unspecified: Secondary | ICD-10-CM | POA: Diagnosis not present

## 2020-02-29 DIAGNOSIS — I10 Essential (primary) hypertension: Secondary | ICD-10-CM | POA: Diagnosis not present

## 2020-03-06 DIAGNOSIS — I4891 Unspecified atrial fibrillation: Secondary | ICD-10-CM | POA: Diagnosis not present

## 2020-03-06 DIAGNOSIS — Z0000 Encounter for general adult medical examination without abnormal findings: Secondary | ICD-10-CM | POA: Diagnosis not present

## 2020-03-06 DIAGNOSIS — D500 Iron deficiency anemia secondary to blood loss (chronic): Secondary | ICD-10-CM | POA: Diagnosis not present

## 2020-03-06 DIAGNOSIS — E039 Hypothyroidism, unspecified: Secondary | ICD-10-CM | POA: Diagnosis not present

## 2020-03-06 DIAGNOSIS — E7800 Pure hypercholesterolemia, unspecified: Secondary | ICD-10-CM | POA: Diagnosis not present

## 2020-03-06 DIAGNOSIS — K649 Unspecified hemorrhoids: Secondary | ICD-10-CM | POA: Diagnosis not present

## 2020-03-06 DIAGNOSIS — E559 Vitamin D deficiency, unspecified: Secondary | ICD-10-CM | POA: Diagnosis not present

## 2020-03-07 ENCOUNTER — Ambulatory Visit: Payer: Self-pay | Admitting: General Surgery

## 2020-03-07 ENCOUNTER — Telehealth: Payer: Self-pay | Admitting: *Deleted

## 2020-03-07 DIAGNOSIS — K648 Other hemorrhoids: Secondary | ICD-10-CM | POA: Diagnosis not present

## 2020-03-07 DIAGNOSIS — K644 Residual hemorrhoidal skin tags: Secondary | ICD-10-CM | POA: Diagnosis not present

## 2020-03-07 NOTE — Telephone Encounter (Signed)
Patient with diagnosis of afib on Eliquis for anticoagulation.    Procedure: hemorrhoidectomy Date of procedure: TBD  CHADS2-VASc score of 22 (age x2, sex, CHF, HTN, CAD, stroke).  CrCl 56m/min Platelet count 235K  Per office protocol, patient can hold Eliquis for 1 day prior to procedure due to elevated cardiovascular risk, resume as soon as safely possible afterwards.  If > 1 day hold is required, will require MD clearance due to elevated risk off of anticoagulation.

## 2020-03-07 NOTE — H&P (View-Only) (Signed)
History of Present Illness [name] Ruff MD; 01/18/2375 11:00 AM) The patient is a 86 year old female who presents with hemorrhoids. 86 year old female who presents to the office with complaints of hemorrhoidal pain and bleeding. This is been going on for many years but has become worse over the past few years. She is on anticoagulation due to history of stroke. She currently is not having any constipation but has had issues with this her whole life. She has used Anusol suppositories and warm compresses as well as Preparation H cream. I saw her last year and recommended hemorrhoidectomy. She declined this. She then presented to the hospital 2 weeks ago with severe rectal bleeding requiring transfusion. She is here today for follow-up. She continues to have episodes of bleeding at home.   Problem List/Past Medical [name] Ruff, MD; 2/83/1517 11:02 AM) INTERNAL AND EXTERNAL BLEEDING HEMORRHOIDS (K64.4, K64.8)  Past Surgical History [name] Ruff, MD; 12/29/735 11:02 AM) Cataract Surgery Bilateral.  Diagnostic Studies History [name] Ruff, MD; 07/20/2692 11:02 AM) Colonoscopy 5-10 years ago Mammogram within last year Pap Smear >5 years ago  Allergies [name], LPN; 8/54/6270 35:00 AM) Codeine Phosphate *ANALGESICS - OPIOID* Penicillins Statins Allergies Reconciled  Medication History [name], LPN; 9/38/1829 93:71 AM) Levothyroxine Sodium (25MCG Tablet, Oral) Active. Eliquis (5MG Tablet, Oral) Active. Labetalol HCl (300MG Tablet, Oral) Active. dilTIAZem HCl ER Coated Beads (120MG Capsule ER 24HR, Oral) Active. Losartan Potassium (25MG Tablet, Oral) Active. Hydrocortisone Acetate (25MG Suppository, Rectal) Active. Tylenol (500MG Capsule, Oral) Active. MiraLax (17GM/SCOOP Powder, Oral) Active. Medications Reconciled  Social History [name] Ruff, MD; 6/96/7893 11:02 AM) Caffeine use Coffee, Tea. No alcohol use No drug use Tobacco  use Former smoker.  Family History [name] Ruff, MD; 02/21/1750 11:02 AM) Cerebrovascular Accident Mother, Sister. Heart Disease Brother, Mother, Sister. Heart disease in female family member before age 22 Heart disease in female family member before age 86 Hypertension Brother, Mother, Sister, Son.  Pregnancy / Birth History [name] Ruff, MD; 0/25/8527 11:02 AM) Age at menarche 80 years. Age of menopause 72-55 Gravida 50 Maternal age 55-25 Para 13  Other Problems [name] Ruff, MD; 7/82/4235 11:02 AM) Arthritis Atrial Fibrillation Cerebrovascular Accident Congestive Heart Failure Hemorrhoids High blood pressure Thyroid Disease     Review of Systems [name] Ruff MD; 3/61/4431 11:03 AM) General Not Present- Appetite Loss, Chills, Fatigue, Fever, Night Sweats, Weight Gain and Weight Loss. Skin Not Present- Change in Wart/Mole, Dryness, Hives, Jaundice, New Lesions, Non-Healing Wounds, Rash and Ulcer. HEENT Present- Ringing in the Ears, Seasonal Allergies and Wears glasses/contact lenses. Not Present- Earache, Hearing Loss, Hoarseness, Nose Bleed, Oral Ulcers, Sinus Pain, Sore Throat, Visual Disturbances and Yellow Eyes. Respiratory Not Present- Bloody sputum, Chronic Cough, Difficulty Breathing, Snoring and Wheezing. Breast Not Present- Breast Mass, Breast Pain, Nipple Discharge and Skin Changes. Cardiovascular Present- Rapid Heart Rate and Swelling of Extremities. Not Present- Chest Pain, Difficulty Breathing Lying Down, Leg Cramps, Palpitations and Shortness of Breath. Gastrointestinal Present- Bloody Stool, Constipation and Hemorrhoids. Not Present- Abdominal Pain, Bloating, Change in Bowel Habits, Chronic diarrhea, Difficulty Swallowing, Excessive gas, Gets full quickly at meals, Indigestion, Nausea, Rectal Pain and Vomiting. Female Genitourinary Present- Urgency. Not Present- Frequency, Nocturia, Painful Urination and Pelvic Pain. Musculoskeletal Not  Present- Back Pain, Joint Pain, Joint Stiffness, Muscle Pain, Muscle Weakness and Swelling of Extremities. Neurological Present- Trouble walking. Not Present- Decreased Memory, Fainting, Headaches, Numbness, Seizures, Tingling, Tremor and Weakness. Psychiatric Not Present- Anxiety, Bipolar, Change in Sleep Pattern, Depression, Fearful and Frequent crying. Hematology  Present- Blood Thinners and Easy Bruising. Not Present- Excessive bleeding, Gland problems, HIV and Persistent Infections.  Vitals [name] LPN; 03/12/9370 69:67 AM) 03/07/2020 10:43 AM Weight: 152 lb Height: 66in Body Surface Area: 1.78 m Body Mass Index: 24.53 kg/m  Temp.: 54F(Thermal Scan)  Pulse: 61 (Regular)  BP: 122/64(Sitting, Left Arm, Standard)        Physical Exam [name] Ruff MD; 8/93/8101 11:01 AM)  General Mental Status-Alert. General Appearance-Cooperative.  Rectal Anorectal Exam External - external hemorrhoids(Dilated vasculature noted around the surface of all 3 external hemorrhoids. This is most likely the source of her bleeding.).   Results [name] Ruff MD; 7/51/0258 10:59 AM) Procedures  Name Value Date ANOSCOPY, DIAGNOSTIC (52778) [ Hemorrhoids ] Procedure Other: Procedure: Anoscopy ...... Surgeon: Marcello Moores ...Marland KitchenMarland KitchenMarland Kitchen After the risks and benefits were explained, verbal consent was obtained for above procedure. A medical assistant chaperone was present thoroughout the entire procedure.  ....Marland KitchenMarland Kitchen Anesthesia: none ...Marland KitchenMarland KitchenMarland Kitchen Diagnosis: rectal bleeding .....Marland Kitchen Findings: min internal hemorrhoids, Bleeding appears to be due to external hemorrhoids  Performed: 03/07/2020 10:58 AM    Assessment & Plan [name] Ruff MD; 2/42/3536 11:02 AM)  INTERNAL AND EXTERNAL BLEEDING HEMORRHOIDS (K64.4) Impression: 86 year old female who presents to the office for evaluation of rectal bleeding. This is significant and causing hospitalizations and requirement of blood transfusions. I have  recommended hemorrhoidectomy as there is no role for rubber band ligation in her certain situation. We discussed the typical postoperative pain associated with hemorrhoidectomy as well as possible incontinence due to loss of her hemorrhoid tissue and decreased sphincter tone. I do not think there are any other viable options. I would like for her to be off anticoagulation for approximate 48 hours before and after surgery if medically possible.

## 2020-03-07 NOTE — H&P (Signed)
History of Present Illness [name] Ruff MD; 6/54/6503 11:00 AM) The patient is a 86 year old female who presents with hemorrhoids. 86 year old female who presents to the office with complaints of hemorrhoidal pain and bleeding. This is been going on for many years but has become worse over the past few years. She is on anticoagulation due to history of stroke. She currently is not having any constipation but has had issues with this her whole life. She has used Anusol suppositories and warm compresses as well as Preparation H cream. I saw her last year and recommended hemorrhoidectomy. She declined this. She then presented to the hospital 2 weeks ago with severe rectal bleeding requiring transfusion. She is here today for follow-up. She continues to have episodes of bleeding at home.   Problem List/Past Medical [name] Ruff, MD; 5/46/5681 11:02 AM) INTERNAL AND EXTERNAL BLEEDING HEMORRHOIDS (K64.4, K64.8)  Past Surgical History [name] Ruff, MD; 2/75/1700 11:02 AM) Cataract Surgery Bilateral.  Diagnostic Studies History [name] Ruff, MD; 1/74/9449 11:02 AM) Colonoscopy 5-10 years ago Mammogram within last year Pap Smear >5 years ago  Allergies [name] Lorenzo, LPN; 6/75/9163 84:66 AM) Codeine Phosphate *ANALGESICS - OPIOID* Penicillins Statins Allergies Reconciled  Medication History [name] Lorenzo, LPN; 5/99/3570 17:79 AM) Levothyroxine Sodium (25MCG Tablet, Oral) Active. Eliquis (5MG Tablet, Oral) Active. Labetalol HCl (300MG Tablet, Oral) Active. dilTIAZem HCl ER Coated Beads (120MG Capsule ER 24HR, Oral) Active. Losartan Potassium (25MG Tablet, Oral) Active. Hydrocortisone Acetate (25MG Suppository, Rectal) Active. Tylenol (500MG Capsule, Oral) Active. MiraLax (17GM/SCOOP Powder, Oral) Active. Medications Reconciled  Social History [name] Ruff, MD; 3/90/3009 11:02 AM) Caffeine use Coffee, Tea. No alcohol use No drug use Tobacco  use Former smoker.  Family History [name] Ruff, MD; 2/33/0076 11:02 AM) Cerebrovascular Accident Mother, Sister. Heart Disease Brother, Mother, Sister. Heart disease in female family member before age 39 Heart disease in female family member before age 56 Hypertension Brother, Mother, Sister, Son.  Pregnancy / Birth History [name] Ruff, MD; [birth year] 11:02 AM) Age at menarche 11 years. Age of menopause 1-55 Gravida 48 Maternal age 22-25 Para 40  Other Problems [name] Ruff, MD; 4/56/2563 11:02 AM) Arthritis Atrial Fibrillation Cerebrovascular Accident Congestive Heart Failure Hemorrhoids High blood pressure Thyroid Disease     Review of Systems [name] Ruff MD; 8/93/7342 11:03 AM) General Not Present- Appetite Loss, Chills, Fatigue, Fever, Night Sweats, Weight Gain and Weight Loss. Skin Not Present- Change in Wart/Mole, Dryness, Hives, Jaundice, New Lesions, Non-Healing Wounds, Rash and Ulcer. HEENT Present- Ringing in the Ears, Seasonal Allergies and Wears glasses/contact lenses. Not Present- Earache, Hearing Loss, Hoarseness, Nose Bleed, Oral Ulcers, Sinus Pain, Sore Throat, Visual Disturbances and Yellow Eyes. Respiratory Not Present- Bloody sputum, Chronic Cough, Difficulty Breathing, Snoring and Wheezing. Breast Not Present- Breast Mass, Breast Pain, Nipple Discharge and Skin Changes. Cardiovascular Present- Rapid Heart Rate and Swelling of Extremities. Not Present- Chest Pain, Difficulty Breathing Lying Down, Leg Cramps, Palpitations and Shortness of Breath. Gastrointestinal Present- Bloody Stool, Constipation and Hemorrhoids. Not Present- Abdominal Pain, Bloating, Change in Bowel Habits, Chronic diarrhea, Difficulty Swallowing, Excessive gas, Gets full quickly at meals, Indigestion, Nausea, Rectal Pain and Vomiting. Female Genitourinary Present- Urgency. Not Present- Frequency, Nocturia, Painful Urination and Pelvic Pain. Musculoskeletal Not  Present- Back Pain, Joint Pain, Joint Stiffness, Muscle Pain, Muscle Weakness and Swelling of Extremities. Neurological Present- Trouble walking. Not Present- Decreased Memory, Fainting, Headaches, Numbness, Seizures, Tingling, Tremor and Weakness. Psychiatric Not Present- Anxiety, Bipolar, Change in Sleep Pattern, Depression, Fearful and Frequent crying. Hematology  Present- Blood Thinners and Easy Bruising. Not Present- Excessive bleeding, Gland problems, HIV and Persistent Infections.  Vitals [name] Billings Dockery LPN; 0/31/2811 88:67 AM) 03/07/2020 10:43 AM Weight: 152 lb Height: 66in Body Surface Area: 1.78 m Body Mass Index: 24.53 kg/m  Temp.: 11F(Thermal Scan)  Pulse: 61 (Regular)  BP: 122/64(Sitting, Left Arm, Standard)        Physical Exam [name] Ruff MD; 7/37/3668 11:01 AM)  General Mental Status-Alert. General Appearance-Cooperative.  Rectal Anorectal Exam External - external hemorrhoids(Dilated vasculature noted around the surface of all 3 external hemorrhoids. This is most likely the source of her bleeding.).   Results [name] Ruff MD; 1/59/4707 10:59 AM) Procedures  Name Value Date ANOSCOPY, DIAGNOSTIC (61518) [ Hemorrhoids ] Procedure Other: Procedure: Anoscopy....Marland KitchenMarland KitchenSurgeon: [name] [name]....Marland KitchenMarland KitchenAfter the risks and benefits were explained, verbal consent was obtained for above procedure. A medical assistant chaperone was present thoroughout the entire procedure. ....Marland KitchenMarland KitchenAnesthesia: none....Marland KitchenMarland KitchenDiagnosis: rectal bleeding....Marland KitchenMarland KitchenFindings: min internal hemorrhoids, Bleeding appears to be due to external hemorrhoids  Performed: 03/07/2020 10:58 AM    Assessment & Plan [name] Ruff MD; 3/43/7357 11:02 AM)  INTERNAL AND EXTERNAL BLEEDING HEMORRHOIDS (K64.4) Impression: 86 year old female who presents to the office for evaluation of rectal bleeding. This is significant and causing hospitalizations and requirement of blood transfusions. I have  recommended hemorrhoidectomy as there is no role for rubber band ligation in her certain situation. We discussed the typical postoperative pain associated with hemorrhoidectomy as well as possible incontinence due to loss of her hemorrhoid tissue and decreased sphincter tone. I do not think there are any other viable options. I would like for her to be off anticoagulation for approximate 48 hours before and after surgery if medically possible.

## 2020-03-07 NOTE — Telephone Encounter (Signed)
Please comment on anticoagulation and then I will contact the patient.  Kerin Ransom PA-C 03/07/2020 3:38 PM

## 2020-03-07 NOTE — Telephone Encounter (Signed)
° °  [facility] Pre-operative Risk Assessment    HEARTCARE STAFF: - Please ensure there is not already an duplicate clearance open for this procedure. - Under Visit Info/Reason for Call, type in Other and utilize the format Clearance MM/DD/YY or Clearance TBD. Do not use dashes or single digits. - If request is for dental extraction, please clarify the # of teeth to be extracted.  Request for surgical clearance:  1. What type of surgery is being performed? HEMORRHOIDECTOMY   2. When is this surgery scheduled? TBD   3. What type of clearance is required (medical clearance vs. Pharmacy clearance to hold med vs. Both)? BOTH  4. Are there any medications that need to be held prior to surgery and how long? ELIQUIS   5. Practice name and name of physician performing surgery? [facility]; DR. Leighton Ruff   6. What is the office phone number? [phone]    7.   What is the office fax number? [place]: [name], LPN  8.   Anesthesia type (None, local, MAC, general) ? MAC   Julaine Hua 03/07/2020, 3:09 PM  _________________________________________________________________   (provider comments below)

## 2020-03-08 DIAGNOSIS — D509 Iron deficiency anemia, unspecified: Secondary | ICD-10-CM | POA: Diagnosis not present

## 2020-03-08 NOTE — Telephone Encounter (Signed)
   Primary Cardiologist: Virl Axe, MD  Chart reviewed and the patient was contacted by phone today as part of pre-operative protocol coverage. Given past medical history and time since last visit, based on ACC/AHA guidelines, [name] [name] would be at acceptable risk for the planned procedure without further cardiovascular testing.   The patient can hold her Eliquis 1 day preop and resume as soon as safe postop.  I will route this recommendation to the requesting party via Epic fax function and remove from pre-op pool.  Please call with questions.  Kerin Ransom, PA-C 03/08/2020, 9:14 AM

## 2020-03-13 ENCOUNTER — Encounter: Payer: Self-pay | Admitting: Internal Medicine

## 2020-03-13 ENCOUNTER — Other Ambulatory Visit (HOSPITAL_COMMUNITY)
Admission: RE | Admit: 2020-03-13 | Discharge: 2020-03-13 | Disposition: A | Discharge status: Home / Self Care | Payer: Medicare PPO | Source: Ambulatory Visit | Attending: General Surgery | Admitting: General Surgery

## 2020-03-13 DIAGNOSIS — Z01812 Encounter for preprocedural laboratory examination: Secondary | ICD-10-CM | POA: Insufficient documentation

## 2020-03-13 DIAGNOSIS — Z20822 Contact with and (suspected) exposure to covid-19: Secondary | ICD-10-CM | POA: Insufficient documentation

## 2020-03-13 LAB — SARS CORONAVIRUS 2 (TAT 6-24 HRS): SARS Coronavirus 2: NEGATIVE

## 2020-03-13 NOTE — Telephone Encounter (Signed)
Patient calling for instructions on her eliquis for her procedure. She states she is afraid to go off the medication for too long.

## 2020-03-13 NOTE — Telephone Encounter (Signed)
   Primary Cardiologist: Virl Axe, MD  Chart reviewed as part of pre-operative protocol coverage. Per chart review it has been deemed the patient can hold her Eliquis 1 day prior to procedure (but if >1 day will need MD clearance). Callback, please call surgeon's office to relay recommendation for holding Eliquis 1 day prior to procedure givne patient's high stroke risk being off blood thinners. If this is acceptable please ask that they contact patient with final recommendations of when she can begin holding based on their surgical date. If they absolutely require a longer hold, please route back to Dr. Caryl Comes (and cc this preop pool). Thanks.  Charlie Pitter, PA-C 03/13/2020, 10:03 AM

## 2020-03-13 NOTE — Telephone Encounter (Signed)
Called surgeon's office to go over recommendations in regards to Eliquis. Asked ot s/w [name], LPN  Surgery scheduler though she was not available. I s/w triage nurse and went over recommendations. Triage nurse states the surgeon is wanting to know they are able to hold Eliquis until procedure 9/16. I advised per cardiologist pt is at high risk of stroke if off blood thinners for extended period. Triage nurse states the pt is bleeding for her hemorrhoids. I asked if they may be able to move the procedure up sooner for the pt. Triage nurse states she will d/w further with surgeon. I will forward new notes to surgeon's office. Triage nurse thanked me for the call. If any further questions to please call our office.

## 2020-03-13 NOTE — Progress Notes (Unsigned)
[facility] DEVICE PROGRAMMING  Patient Information: Name:  ILHAM ROUGHTON  DOB:  04/16/1931  MRN:  [id number]  {TIP - You do not have to delete this tip  -  Copy the info from the staff message sent by the PAT staff  then press F2 here and paste the information using CTL - V on the next line :[id number]}  Planned Procedure: Hemorrhoidectomy  Surgeon: Dr Leighton Ruff  Date of Procedure: 03/15/2020  Cautery will be used.  Position during surgery: Lithotomy   Please send documentation back to:  [facility] (Fax # [phone])   Device Information:  Clinic EP Physician:  Virl Axe, MD   Device Type:  Pacemaker Manufacturer and Phone #:  Medtronic: [phone] Pacemaker Dependent?:  Yes.   Date of Last Device Check:  01/28/20 Normal Device Function?:  Yes.    Electrophysiologist's Recommendations:   Have magnet available.  Provide continuous ECG monitoring when magnet is used or reprogramming is to be performed.   Procedure should not interfere with device function.  No device programming or magnet placement needed.  Per Device Clinic Standing Orders, Simone Curia, RN  12:24 PM 03/13/2020

## 2020-03-14 ENCOUNTER — Other Ambulatory Visit: Payer: Self-pay

## 2020-03-14 ENCOUNTER — Encounter (HOSPITAL_BASED_OUTPATIENT_CLINIC_OR_DEPARTMENT_OTHER): Payer: Self-pay | Admitting: General Surgery

## 2020-03-14 NOTE — Telephone Encounter (Signed)
Procedure appears to be moved up to tomorrow. Recommendations as outlined below. Will remove from pre-op box.

## 2020-03-14 NOTE — Progress Notes (Addendum)
Spoke w/ via phone for pre-op interview--- PT Lab needs dos----  Istat             Lab results------ current ekg/ chest ct in epic COVID test ------ 03-13-2020 @ 1430 Arrive at ------- 0930 NPO after MN NO Solid Food.  Clear liquids from MN until--- 0830 Medications to take morning of surgery ----- Labetalol, Cardizem, Synthroid w/ sips of water Diabetic medication ----- n/a Patient Special Instructions ----- n/a Pre-Op special Istructions ----- pt has telephone cardiac clearance to stop eliquis dated 03-13-2020, Dayna Dunn PA, in epic/ chart.  And device orders requested and completed 03-13-2020 in epic/ chart. Patient verbalized understanding of instructions that were given at this phone interview. Patient denies shortness of breath, chest pain, fever, cough at this phone interview.   Anesthesia Review:  Hx HTN; Permanent AFib;  CHF chronic diastolic;  AAA;  Chronic venous insuffiency;  SSS /Tachy-Brady s/p  PPM (Medtronic) 2013;  Hx ICH/ CVA 09/ 2016 without residual.  Pt denies any cardiac/ stroke s&s other than peripheral ankle swelling.  Chart reviewed by anesthesia, Konrad Felix PA and Dr Lissa Hoard MDA, ok to proceed at  [facility] as long as pt is stable.  PCP:  Dr [name]  Cardiologist : Dr Caryl Comes (lov 11-12-2019 epic)  Vascular:  Dr Servando Snare (lov 01-13-2020 epic)  Neurologist:  Dr Erlinda Hong (lov 04-14-2017 epic, pt was released)   Chest x-ray : chest CT 01-13-2020 epic EKG : 12-16-2019 epic Echo : 09-09-2019 epic Stress test:  02-11-2013 epic Cardiac Cath :  10-28-2017 epic Activity level:  No sob with household chores but does get sob going up flight of stairs Sleep Study/ CPAP :  NO Fasting Blood Sugar :      / Checks Blood Sugar -- times a day:  N/A Blood Thinner/ Instructions /Last Dose: Eliquis/  Pt stated was given instructions by cardiologist office to stop one day prior to surgery, last dose 03-13-2020 @ 2000/  Clearance in epic/ chart ASA / Instructions/ Last Dose :

## 2020-03-15 ENCOUNTER — Ambulatory Visit (HOSPITAL_BASED_OUTPATIENT_CLINIC_OR_DEPARTMENT_OTHER): Payer: Medicare PPO | Admitting: Physician Assistant

## 2020-03-15 ENCOUNTER — Ambulatory Visit (HOSPITAL_BASED_OUTPATIENT_CLINIC_OR_DEPARTMENT_OTHER)
Admission: RE | Admit: 2020-03-15 | Discharge: 2020-03-15 | Disposition: A | Discharge status: Home / Self Care | Payer: Medicare PPO | Attending: General Surgery | Admitting: General Surgery

## 2020-03-15 ENCOUNTER — Encounter (HOSPITAL_BASED_OUTPATIENT_CLINIC_OR_DEPARTMENT_OTHER): Payer: Self-pay | Admitting: General Surgery

## 2020-03-15 ENCOUNTER — Encounter (HOSPITAL_BASED_OUTPATIENT_CLINIC_OR_DEPARTMENT_OTHER)
Admission: RE | Disposition: A | Discharge status: Home / Self Care | Payer: Self-pay | Source: Home / Self Care | Attending: General Surgery

## 2020-03-15 DIAGNOSIS — Z87891 Personal history of nicotine dependence: Secondary | ICD-10-CM | POA: Diagnosis not present

## 2020-03-15 DIAGNOSIS — Z79899 Other long term (current) drug therapy: Secondary | ICD-10-CM | POA: Diagnosis not present

## 2020-03-15 DIAGNOSIS — I509 Heart failure, unspecified: Secondary | ICD-10-CM | POA: Diagnosis not present

## 2020-03-15 DIAGNOSIS — Z950 Presence of cardiac pacemaker: Secondary | ICD-10-CM | POA: Insufficient documentation

## 2020-03-15 DIAGNOSIS — I110 Hypertensive heart disease with heart failure: Secondary | ICD-10-CM | POA: Diagnosis not present

## 2020-03-15 DIAGNOSIS — K641 Second degree hemorrhoids: Secondary | ICD-10-CM | POA: Insufficient documentation

## 2020-03-15 DIAGNOSIS — E079 Disorder of thyroid, unspecified: Secondary | ICD-10-CM | POA: Diagnosis not present

## 2020-03-15 DIAGNOSIS — Z79890 Hormone replacement therapy (postmenopausal): Secondary | ICD-10-CM | POA: Diagnosis not present

## 2020-03-15 DIAGNOSIS — I739 Peripheral vascular disease, unspecified: Secondary | ICD-10-CM | POA: Diagnosis not present

## 2020-03-15 DIAGNOSIS — Z7901 Long term (current) use of anticoagulants: Secondary | ICD-10-CM | POA: Diagnosis not present

## 2020-03-15 DIAGNOSIS — K644 Residual hemorrhoidal skin tags: Secondary | ICD-10-CM | POA: Insufficient documentation

## 2020-03-15 DIAGNOSIS — I714 Abdominal aortic aneurysm, without rupture: Secondary | ICD-10-CM | POA: Diagnosis not present

## 2020-03-15 DIAGNOSIS — M1990 Unspecified osteoarthritis, unspecified site: Secondary | ICD-10-CM | POA: Diagnosis not present

## 2020-03-15 DIAGNOSIS — I493 Ventricular premature depolarization: Secondary | ICD-10-CM | POA: Diagnosis not present

## 2020-03-15 DIAGNOSIS — K648 Other hemorrhoids: Secondary | ICD-10-CM | POA: Diagnosis not present

## 2020-03-15 DIAGNOSIS — K625 Hemorrhage of anus and rectum: Secondary | ICD-10-CM | POA: Insufficient documentation

## 2020-03-15 DIAGNOSIS — K642 Third degree hemorrhoids: Secondary | ICD-10-CM | POA: Diagnosis not present

## 2020-03-15 DIAGNOSIS — Z8673 Personal history of transient ischemic attack (TIA), and cerebral infarction without residual deficits: Secondary | ICD-10-CM | POA: Insufficient documentation

## 2020-03-15 DIAGNOSIS — I4891 Unspecified atrial fibrillation: Secondary | ICD-10-CM | POA: Diagnosis not present

## 2020-03-15 DIAGNOSIS — I4821 Permanent atrial fibrillation: Secondary | ICD-10-CM | POA: Diagnosis not present

## 2020-03-15 HISTORY — DX: Hemorrhage of anus and rectum: K62.5

## 2020-03-15 HISTORY — DX: Personal history of urinary (tract) infections: Z87.440

## 2020-03-15 HISTORY — DX: Unspecified osteoarthritis, unspecified site: M19.90

## 2020-03-15 HISTORY — DX: Sick sinus syndrome: I49.5

## 2020-03-15 HISTORY — DX: Venous insufficiency (chronic) (peripheral): I87.2

## 2020-03-15 HISTORY — DX: Localized edema: R60.0

## 2020-03-15 HISTORY — DX: Personal history of colonic polyps: Z86.010

## 2020-03-15 HISTORY — DX: Personal history of adenomatous and serrated colon polyps: Z86.0101

## 2020-03-15 HISTORY — PX: HEMORRHOID SURGERY: SHX153

## 2020-03-15 HISTORY — DX: Personal history of transient ischemic attack (TIA), and cerebral infarction without residual deficits: Z86.73

## 2020-03-15 LAB — POCT I-STAT, CHEM 8
BUN: 11 mg/dL (ref 8–23)
Calcium, Ion: 1.31 mmol/L (ref 1.15–1.40)
Chloride: 107 mmol/L (ref 98–111)
Creatinine, Ser: 1.1 mg/dL — ABNORMAL HIGH (ref 0.44–1.00)
Glucose, Bld: 96 mg/dL (ref 70–99)
HCT: 30 % — ABNORMAL LOW (ref 36.0–46.0)
Hemoglobin: 10.2 g/dL — ABNORMAL LOW (ref 12.0–15.0)
Potassium: 3.5 mmol/L (ref 3.5–5.1)
Sodium: 142 mmol/L (ref 135–145)
TCO2: 22 mmol/L (ref 22–32)

## 2020-03-15 SURGERY — HEMORRHOIDECTOMY
Anesthesia: Monitor Anesthesia Care | Site: Rectum

## 2020-03-15 MED ORDER — SODIUM CHLORIDE 0.9% FLUSH: 3.0000 mL | Freq: Two times a day (BID) | INTRAVENOUS | Status: DC

## 2020-03-15 MED ORDER — LACTATED RINGERS IV SOLN: INTRAVENOUS | Status: DC

## 2020-03-15 MED ORDER — LIDOCAINE 5 % EX OINT
TOPICAL_OINTMENT | CUTANEOUS | Status: DC | PRN
  Administered 2020-03-15: 1

## 2020-03-15 MED ORDER — SODIUM CHLORIDE 0.9% FLUSH: 3.0000 mL | INTRAVENOUS | Status: DC | PRN

## 2020-03-15 MED ORDER — SODIUM CHLORIDE 0.9 % IV SOLN: 250.0000 mL | INTRAVENOUS | Status: DC | PRN

## 2020-03-15 MED ORDER — LIDOCAINE 2% (20 MG/ML) 5 ML SYRINGE
INTRAMUSCULAR | Status: AC
  Filled 2020-03-15: qty 5

## 2020-03-15 MED ORDER — BUPIVACAINE-EPINEPHRINE 0.5% -1:200000 IJ SOLN
INTRAMUSCULAR | Status: DC | PRN
  Administered 2020-03-15: 30 mL

## 2020-03-15 MED ORDER — ONDANSETRON HCL 4 MG/2ML IJ SOLN: 4.0000 mg | Freq: Once | INTRAMUSCULAR | Status: DC | PRN

## 2020-03-15 MED ORDER — LIDOCAINE 2% (20 MG/ML) 5 ML SYRINGE
INTRAMUSCULAR | Status: DC | PRN
  Administered 2020-03-15: 40 mg via INTRAVENOUS

## 2020-03-15 MED ORDER — PROPOFOL 500 MG/50ML IV EMUL
INTRAVENOUS | Status: AC
  Filled 2020-03-15: qty 50

## 2020-03-15 MED ORDER — ONDANSETRON HCL 4 MG/2ML IJ SOLN
INTRAMUSCULAR | Status: AC
  Filled 2020-03-15: qty 2

## 2020-03-15 MED ORDER — ONDANSETRON HCL 4 MG/2ML IJ SOLN
INTRAMUSCULAR | Status: DC | PRN
  Administered 2020-03-15: 4 mg via INTRAVENOUS

## 2020-03-15 MED ORDER — BUPIVACAINE LIPOSOME 1.3 % IJ SUSP
INTRAMUSCULAR | Status: DC | PRN
  Administered 2020-03-15: 20 mL

## 2020-03-15 MED ORDER — FENTANYL CITRATE (PF) 100 MCG/2ML IJ SOLN
INTRAMUSCULAR | Status: AC
  Filled 2020-03-15: qty 2

## 2020-03-15 MED ORDER — ACETAMINOPHEN 325 MG RE SUPP: 650.0000 mg | RECTAL | Status: DC | PRN

## 2020-03-15 MED ORDER — MIDAZOLAM HCL 2 MG/2ML IJ SOLN
INTRAMUSCULAR | Status: AC
  Filled 2020-03-15: qty 2

## 2020-03-15 MED ORDER — FENTANYL CITRATE (PF) 100 MCG/2ML IJ SOLN: 25.0000 ug | INTRAMUSCULAR | Status: DC | PRN

## 2020-03-15 MED ORDER — FENTANYL CITRATE (PF) 100 MCG/2ML IJ SOLN
INTRAMUSCULAR | Status: DC | PRN
Start: 2020-03-15 — End: 2020-03-15
  Administered 2020-03-15 (×2): 25 ug via INTRAVENOUS
  Administered 2020-03-15: 50 ug via INTRAVENOUS

## 2020-03-15 MED ORDER — MIDAZOLAM HCL 5 MG/5ML IJ SOLN
INTRAMUSCULAR | Status: DC | PRN
  Administered 2020-03-15 (×2): 1 mg via INTRAVENOUS

## 2020-03-15 MED ORDER — ACETAMINOPHEN 325 MG PO TABS: 650.0000 mg | ORAL_TABLET | ORAL | Status: DC | PRN

## 2020-03-15 MED ORDER — OXYCODONE HCL 5 MG PO TABS: 5.0000 mg | ORAL_TABLET | Freq: Four times a day (QID) | ORAL | 0 refills | Status: DC | PRN

## 2020-03-15 MED ORDER — BUPIVACAINE LIPOSOME 1.3 % IJ SUSP: 20.0000 mL | Freq: Once | INTRAMUSCULAR | Status: DC

## 2020-03-15 MED ORDER — KETAMINE HCL 10 MG/ML IJ SOLN
INTRAMUSCULAR | Status: AC
  Filled 2020-03-15: qty 1

## 2020-03-15 MED ORDER — PROPOFOL 10 MG/ML IV BOLUS
INTRAVENOUS | Status: DC | PRN
  Administered 2020-03-15 (×2): 10 mg via INTRAVENOUS

## 2020-03-15 MED ORDER — PROPOFOL 500 MG/50ML IV EMUL
INTRAVENOUS | Status: DC | PRN
  Administered 2020-03-15: 50 ug/kg/min via INTRAVENOUS

## 2020-03-15 MED ORDER — OXYCODONE HCL 5 MG PO TABS: 5.0000 mg | ORAL_TABLET | ORAL | Status: DC | PRN

## 2020-03-15 SURGICAL SUPPLY — 45 items
BLADE EXTENDED COATED 6.5IN (ELECTRODE) IMPLANT
BLADE HEX COATED 2.75 (ELECTRODE) ×3 IMPLANT
BLADE SURG 10 STRL SS (BLADE) IMPLANT
BRIEF STRETCH FOR OB PAD LRG (UNDERPADS AND DIAPERS) IMPLANT
COVER BACK TABLE 60X90IN (DRAPES) ×3 IMPLANT
COVER MAYO STAND STRL (DRAPES) ×3 IMPLANT
COVER WAND RF STERILE (DRAPES) ×3 IMPLANT
DRAPE LAPAROTOMY 100X72 PEDS (DRAPES) ×3 IMPLANT
DRAPE UTILITY XL STRL (DRAPES) ×3 IMPLANT
DRSG PAD ABDOMINAL 8X10 ST (GAUZE/BANDAGES/DRESSINGS) ×3 IMPLANT
ELECT REM PT RETURN 9FT ADLT (ELECTROSURGICAL) ×3
ELECTRODE REM PT RTRN 9FT ADLT (ELECTROSURGICAL) ×1 IMPLANT
GAUZE SPONGE 4X4 12PLY STRL (GAUZE/BANDAGES/DRESSINGS) ×3 IMPLANT
GLOVE BIO SURGEON STRL SZ 6.5 (GLOVE) ×2 IMPLANT
GLOVE BIO SURGEONS STRL SZ 6.5 (GLOVE) ×1
GLOVE BIOGEL PI IND STRL 6.5 (GLOVE) ×1 IMPLANT
GLOVE BIOGEL PI IND STRL 7.0 (GLOVE) ×1 IMPLANT
GLOVE BIOGEL PI IND STRL 7.5 (GLOVE) ×1 IMPLANT
GLOVE BIOGEL PI INDICATOR 6.5 (GLOVE) ×2
GLOVE BIOGEL PI INDICATOR 7.0 (GLOVE) ×2
GLOVE BIOGEL PI INDICATOR 7.5 (GLOVE) ×2
GLOVE ECLIPSE 6.5 STRL STRAW (GLOVE) ×3 IMPLANT
GOWN STRL REUS W/TWL XL LVL3 (GOWN DISPOSABLE) ×3 IMPLANT
KIT SIGMOIDOSCOPE (SET/KITS/TRAYS/PACK) IMPLANT
KIT TURNOVER CYSTO (KITS) ×3 IMPLANT
NEEDLE HYPO 22GX1.5 SAFETY (NEEDLE) ×3 IMPLANT
NS IRRIG 500ML POUR BTL (IV SOLUTION) ×3 IMPLANT
PACK BASIN DAY SURGERY FS (CUSTOM PROCEDURE TRAY) ×3 IMPLANT
PAD ARMBOARD 7.5X6 YLW CONV (MISCELLANEOUS) IMPLANT
PENCIL SMOKE EVACUATOR (MISCELLANEOUS) ×3 IMPLANT
SHEARS HARMONIC 9CM CVD (BLADE) ×3 IMPLANT
SPONGE SURGIFOAM ABS GEL 100 (HEMOSTASIS) IMPLANT
SPONGE SURGIFOAM ABS GEL 12-7 (HEMOSTASIS) IMPLANT
SUT CHROMIC 2 0 SH (SUTURE) ×6 IMPLANT
SUT CHROMIC 3 0 SH 27 (SUTURE) ×6 IMPLANT
SUT VIC AB 2-0 SH 27 (SUTURE)
SUT VIC AB 2-0 SH 27XBRD (SUTURE) IMPLANT
SUT VIC AB 4-0 SH 18 (SUTURE) IMPLANT
SYR CONTROL 10ML LL (SYRINGE) ×3 IMPLANT
TOWEL OR 17X26 10 PK STRL BLUE (TOWEL DISPOSABLE) ×3 IMPLANT
TRAY DSU PREP LF (CUSTOM PROCEDURE TRAY) ×3 IMPLANT
TUBE CONNECTING 12'X1/4 (SUCTIONS) ×1
TUBE CONNECTING 12X1/4 (SUCTIONS) ×2 IMPLANT
WATER STERILE IRR 500ML POUR (IV SOLUTION) IMPLANT
YANKAUER SUCT BULB TIP NO VENT (SUCTIONS) ×3 IMPLANT

## 2020-03-15 NOTE — Anesthesia Preprocedure Evaluation (Signed)
Anesthesia Evaluation  Patient identified by MRN, date of birth, ID band Patient awake    Reviewed: Allergy & Precautions, NPO status , Patient's Chart, lab work & pertinent test results  Airway Mallampati: II  TM Distance: >3 FB Neck ROM: Full    Dental no notable dental hx.    Pulmonary neg pulmonary ROS, former smoker,    Pulmonary exam normal breath sounds clear to auscultation       Cardiovascular hypertension, + Peripheral Vascular Disease  Normal cardiovascular exam+ dysrhythmias Atrial Fibrillation + pacemaker  Rhythm:Regular Rate:Normal     Neuro/Psych CVA negative psych ROS   GI/Hepatic negative GI ROS, Neg liver ROS,   Endo/Other  negative endocrine ROS  Renal/GU negative Renal ROS  negative genitourinary   Musculoskeletal negative musculoskeletal ROS (+)   Abdominal   Peds negative pediatric ROS (+)  Hematology negative hematology ROS (+)   Anesthesia Other Findings   Reproductive/Obstetrics negative OB ROS                             Anesthesia Physical Anesthesia Plan  ASA: III  Anesthesia Plan: MAC   Post-op Pain Management:    Induction: Intravenous  PONV Risk Score and Plan: 2 and Ondansetron, Propofol infusion and Treatment may vary due to age or medical condition  Airway Management Planned: Simple Face Mask  Additional Equipment:   Intra-op Plan:   Post-operative Plan:   Informed Consent: I have reviewed the patients History and Physical, chart, labs and discussed the procedure including the risks, benefits and alternatives for the proposed anesthesia with the patient or authorized representative who has indicated his/her understanding and acceptance.     Dental advisory given  Plan Discussed with: CRNA and Surgeon  Anesthesia Plan Comments:         Anesthesia Quick Evaluation

## 2020-03-15 NOTE — Discharge Instructions (Addendum)
ANORECTAL SURGERY: POST OP INSTRUCTIONS 1. Take your usually prescribed home medications unless otherwise directed. 2. DIET: During the first few hours after surgery sip on some liquids until you are able to urinate.  It is normal to not urinate for several hours after this surgery.  If you feel uncomfortable, please contact the office for instructions.  After you are able to urinate,you may eat, if you feel like it.  Follow a light bland diet the first 24 hours after arrival home, such as soup, liquids, crackers, etc.  Be sure to include lots of fluids daily (6-8 glasses).  Avoid fast food or heavy meals, as your are more likely to get nauseated.  Eat a low fat diet the next few days after surgery.  Limit caffeine intake to 1-2 servings a day. 3. PAIN CONTROL: a. Pain is best controlled by a usual combination of several different methods TOGETHER: i. Muscle relaxation: Soak in a warm bath (or Sitz bath) three times a day and after bowel movements.  Continue to do this until all pain is resolved. ii. Over the counter pain medication iii. Prescription pain medication b. Most patients will experience some swelling and discomfort in the anus/rectal area and incisions.  Heat such as warm towels, sitz baths, warm baths, etc to help relax tight/sore spots and speed recovery.  Some people prefer to use ice, especially in the first couple days after surgery, as it may decrease the pain and swelling, or alternate between ice & heat.  Experiment to what works for you.  Swelling and bruising can take several weeks to resolve.  Pain can take even longer to completely resolve. c. It is helpful to take an over-the-counter pain medication regularly for the first few weeks.  Choose one of the following that works best for you: i. Naproxen (Aleve, etc)  Two 253m tabs twice a day ii. Ibuprofen (Advil, etc) Three 2067mtabs four times a day (every meal & bedtime) d. A  prescription for pain medication (such as percocet,  oxycodone, hydrocodone, etc) should be given to you upon discharge.  Take your pain medication as prescribed.  i. If you are having problems/concerns with the prescription medicine (does not control pain, nausea, vomiting, rash, itching, etc), please call [phone] see if we need to switch you to a different pain medicine that will work better for you and/or control your side effect better. ii. If you need a refill on your pain medication, please contact your pharmacy.  They will contact our office to request authorization. Prescriptions will not be filled after 5 pm or on week-ends. 4. KEEP YOUR BOWELS REGULAR and AVOID CONSTIPATION a. The goal is one to two soft bowel movements a day.  You should at least have a bowel movement every other day. b. Avoid getting constipated.  Between the surgery and the pain medications, it is common to experience some constipation. This can be very painful after rectal surgery.  Increasing fluid intake and taking a fiber supplement (such as Metamucil, Citrucel, FiberCon, etc) 1-2 times a day regularly will usually help prevent this problem from occurring.  A stool softener like colace is also recommended.  This can be purchased over the counter at your pharmacy.  You can take it up to 3 times a day.  If you do not have a bowel movement after 24 hrs since your surgery, take one does of milk of magnesia.  If you still haven't had a bowel movement 8-12 hours after  that dose, take another dose.  If you don't have a bowel movement 48 hrs after surgery, purchase a Fleets enema from the drug store and administer gently per package instructions.  If you still are having trouble with your bowel movements after that, please call the office for further instructions. c. If you develop diarrhea or have many loose bowel movements, simplify your diet to bland foods & liquids for a few days.  Stop any stool softeners and decrease your fiber supplement.  Switching to mild  anti-diarrheal medications (Kayopectate, Pepto Bismol) can help.  If this worsens or does not improve, please call us.  5. Wound Care a. Remove your bandages before your first bowel movement or 8 hours after surgery.     b. Remove any wound packing material at this tim,e as well.  You do not need to repack the wound unless instructed otherwise.  Wear an absorbent pad or soft cotton gauze in your underwear to catch any drainage and help keep the area clean. You should change this every 2-3 hours while awake. c. Keep the area clean and dry.  Bathe / shower every day, especially after bowel movements.  Keep the area clean by showering / bathing over the incision / wound.   It is okay to soak an open wound to help wash it.  Wet wipes or showers / gentle washing after bowel movements is often less traumatic than regular toilet paper. d. Dennis Bast may have some styrofoam-like soft packing in the rectum which will come out with the first bowel movement.  e. You will often notice bleeding with bowel movements.  This should slow down by the end of the first week of surgery f. Expect some drainage.  This should slow down, too, by the end of the first week of surgery.  Wear an absorbent pad or soft cotton gauze in your underwear until the drainage stops. g. Do Not sit on a rubber or pillow ring.  This can make you symptoms worse.  You may sit on a soft pillow if needed.  6. ACTIVITIES as tolerated:   a. You may resume regular (light) daily activities beginning the next day--such as daily self-care, walking, climbing stairs--gradually increasing activities as tolerated.  If you can walk 30 minutes without difficulty, it is safe to try more intense activity such as jogging, treadmill, bicycling, low-impact aerobics, swimming, etc. b. Save the most intensive and strenuous activity for last such as sit-ups, heavy lifting, contact sports, etc  Refrain from any heavy lifting or straining until you are off narcotics for pain  control.   c. You may drive when you are no longer taking prescription pain medication, you can comfortably sit for long periods of time, and you can safely maneuver your car and apply brakes. d. Dennis Bast may have sexual intercourse when it is comfortable.  7. FOLLOW UP in our office a. Please call [facility] at [phone] to set up an appointment to see your surgeon in the office for a follow-up appointment approximately 3-4 weeks after your surgery. b. Make sure that you call for this appointment the day you arrive home to insure a convenient appointment time. 10. IF YOU HAVE DISABILITY OR FAMILY LEAVE FORMS, BRING THEM TO THE OFFICE FOR PROCESSING.  DO NOT GIVE THEM TO YOUR DOCTOR.     WHEN TO CALL us [phone]: 1. Poor pain control 2. Reactions / problems with new medications (rash/itching, nausea, etc)  3. Fever over 101.5 F (38.5 C) 4.  Inability to urinate 5. Nausea and/or vomiting 6. Worsening swelling or bruising 7. Continued bleeding from incision. 8. Increased pain, redness, or drainage from the incision  The clinic staff is available to answer your questions during regular business hours (8:30am-5pm).  Please don't hesitate to call and ask to speak to one of our nurses for clinical concerns.   A surgeon from [facility] is always on call at the hospitals   If you have a medical emergency, go to the nearest emergency room or call 911.    [facility], [address], [address], [place], [place]  [address] ? MAIN: [phone] ? TOLL FREE: [phone] ? FAX [phone] [email or web address]     Post Anesthesia Home Care Instructions  Activity: Get plenty of rest for the remainder of the day. A responsible individual must stay with you for 24 hours following the procedure.  For the next 24 hours, DO NOT: -Drive a car -Paediatric nurse -Drink alcoholic beverages -Take any medication unless instructed by your  physician -Make any legal decisions or sign important papers.  Meals: Start with liquid foods such as gelatin or soup. Progress to regular foods as tolerated. Avoid greasy, spicy, heavy foods. If nausea and/or vomiting occur, drink only clear liquids until the nausea and/or vomiting subsides. Call your physician if vomiting continues.  Special Instructions/Symptoms: Your throat may feel dry or sore from the anesthesia or the breathing tube placed in your throat during surgery. If this causes discomfort, gargle with warm salt water. The discomfort should disappear within 24 hours.  If you had a scopolamine patch placed behind your ear for the management of post- operative nausea and/or vomiting:  1. The medication in the patch is effective for 72 hours, after which it should be removed.  Wrap patch in a tissue and discard in the trash. Wash hands thoroughly with soap and water. 2. You may remove the patch earlier than 72 hours if you experience unpleasant side effects which may include dry mouth, dizziness or visual disturbances. 3. Avoid touching the patch. Wash your hands with soap and water after contact with the patch.

## 2020-03-15 NOTE — Op Note (Signed)
03/15/2020  11:18 AM  PATIENT:  [name] [name]  86 y.o. female  Patient Care Team: [name], MD as PCP - General (Internal Medicine) [name], MD as PCP - Cardiology (Cardiology) [name], MD as PCP - Electrophysiology (Cardiology) [name], MD (Cardiology) [name], MD (Internal Medicine)  PRE-OPERATIVE DIAGNOSIS:  RECTAL BLEEDING  POST-OPERATIVE DIAGNOSIS:  RECTAL BLEEDING  PROCEDURE: 3 COLUMN HEMORRHOIDECTOMY    Surgeon(s): Leighton Ruff, MD  ASSISTANT: none   ANESTHESIA:   local and MAC  SPECIMEN:  Source of Specimen:  Left lateral, right posterior and right anterior hemorrhoids  DISPOSITION OF SPECIMEN:  PATHOLOGY  COUNTS:  YES  PLAN OF CARE: Discharge to home after PACU  PATIENT DISPOSITION:  PACU - hemodynamically stable.  INDICATION: 86 year old female on anticoagulation who continues to have rectal bleeding despite maximal medical therapy. Her bleeding appears to be distal to the dentate line and therefore I recommended 3 column hemorrhoidectomy.   OR FINDINGS: Slightly decreased sphincter tone, normal-appearing anatomy, bulky grade 2 hemorrhoids at all 3 positions.  DESCRIPTION: the patient was identified in the preoperative holding area and taken to the OR where they were laid on the operating room table.  MAC anesthesia was induced without difficulty. The patient was then positioned in prone jackknife position with buttocks gently taped apart.  The patient was then prepped and draped in usual sterile fashion.  SCDs were noted to be in place prior to the initiation of anesthesia. A surgical timeout was performed indicating the correct patient, procedure, positioning and need for preoperative antibiotics.  A rectal block was performed using Marcaine with epinephrine mixed with Exparel.    I began with a digital rectal exam. There were no masses noted. The patient had mildly decreased sphincter tone. I then placed a Hill-Ferguson anoscope  into the anal canal and evaluated this completely. I began with the left lateral hemorrhoid. This was elevated using DeBakey forceps and an incision was made around the hemorrhoid using a 10 blade scalpel. I then dissected the hemorrhoidal tissue away from the sphincter complex using blunt dissection. I then used a harmonic scalpel to divide the hemorrhoidal tissue above the sphincter complex. I continue my dissection down to rectal mucosa proximal to the hemorrhoid. This was then sent to pathology as left lateral hemorrhoid. The anoderm was closed using a running 2-0 chromic suture. Hemostasis was good. The anoderm distal to the dentate line was then closed with a running 3-0 chromic suture. I then turned my attention to the patient's left posterior hemorrhoid. This was dissected in similar fashion making sure to preserve the sphincter complex underneath. This was also closed with a 2-0 chromic suture proximal to the dentate and a 3-0 chromic suture distal to the dentate line. Hemostasis was good. I then completed my remainder of dissection around the right anterior hemorrhoid also in similar fashion. It was then closed with chromic sutures as the other 2 were. There was still a small amount of hemorrhoid tissue at posterior midline. A hemorrhoidal pexy was performed using a 3-0 Vicryl suture. Additional Exparel and Marcaine were placed around the incision sites. Hemostasis was good. A dressing was applied. The patient was then awakened from anesthesia and sent to the postanesthesia care unit in stable condition. All counts were correct per operating room staff.

## 2020-03-15 NOTE — Transfer of Care (Signed)
Immediate Anesthesia Transfer of Care Note  Patient: [name] [name]  Procedure(s) Performed: 3 COLUMN HEMORRHOIDECTOMY (N/A Rectum)  Patient Location: PACU  Anesthesia Type:MAC  Level of Consciousness: drowsy and patient cooperative  Airway & Oxygen Therapy: Patient Spontanous Breathing and Patient connected to face mask oxygen  Post-op Assessment: Report given to RN and Post -op Vital signs reviewed and stable  Post vital signs: Reviewed and stable  Last Vitals:  Vitals Value Taken Time  BP 102/54   Temp    Pulse 65   Resp 20 03/15/20 1118  SpO2 97%   Vitals shown include unvalidated device data.  Last Pain:  Vitals:   03/15/20 1012  TempSrc: Oral  PainSc: 0-No pain      Patients Stated Pain Goal: 4 (01/56/15 3794)  Complications: No complications documented.

## 2020-03-15 NOTE — Anesthesia Postprocedure Evaluation (Signed)
Anesthesia Post Note  Patient: [name] [name]  Procedure(s) Performed: 3 COLUMN HEMORRHOIDECTOMY (N/A Rectum)     Patient location during evaluation: PACU Anesthesia Type: MAC Level of consciousness: awake and alert Pain management: pain level controlled Vital Signs Assessment: post-procedure vital signs reviewed and stable Respiratory status: spontaneous breathing, nonlabored ventilation, respiratory function stable and patient connected to nasal cannula oxygen Cardiovascular status: stable and blood pressure returned to baseline Postop Assessment: no apparent nausea or vomiting Anesthetic complications: no   No complications documented.  Last Vitals:  Vitals:   03/15/20 1145 03/15/20 1146  BP: (!) 106/47   Pulse:    Resp:    Temp:  (!) 36.3 C  SpO2:      Last Pain:  Vitals:   03/15/20 1145  TempSrc:   PainSc: 0-No pain                 Jamika Sadek S

## 2020-03-15 NOTE — Interval H&P Note (Signed)
History and Physical Interval Note:  03/15/2020 9:44 AM  [name] [name]  has presented today for surgery, with the diagnosis of RECTAL BLEEDING.  The various methods of treatment have been discussed with the patient and family. After consideration of risks, benefits and other options for treatment, the patient has consented to  Procedure(s): 3 COLUMN HEMORRHOIDECTOMY (N/A) as a surgical intervention.  The patient's history has been reviewed, patient examined, no change in status, stable for surgery.  I have reviewed the patient's chart and labs.  Questions were answered to the patient's satisfaction.     Rosario Adie, MD  [facility]

## 2020-03-16 ENCOUNTER — Other Ambulatory Visit: Payer: Self-pay

## 2020-03-16 ENCOUNTER — Inpatient Hospital Stay (HOSPITAL_COMMUNITY)
Admission: EM | Admit: 2020-03-16 | Discharge: 2020-03-24 | DRG: 812 | Disposition: A | Discharge status: Skilled Nursing Facility | Payer: Medicare PPO | Attending: Internal Medicine | Admitting: Internal Medicine

## 2020-03-16 ENCOUNTER — Encounter (HOSPITAL_BASED_OUTPATIENT_CLINIC_OR_DEPARTMENT_OTHER): Payer: Self-pay | Admitting: General Surgery

## 2020-03-16 DIAGNOSIS — K921 Melena: Secondary | ICD-10-CM | POA: Diagnosis present

## 2020-03-16 DIAGNOSIS — I4891 Unspecified atrial fibrillation: Secondary | ICD-10-CM | POA: Diagnosis not present

## 2020-03-16 DIAGNOSIS — B9620 Unspecified Escherichia coli [E. coli] as the cause of diseases classified elsewhere: Secondary | ICD-10-CM | POA: Diagnosis present

## 2020-03-16 DIAGNOSIS — Z881 Allergy status to other antibiotic agents status: Secondary | ICD-10-CM | POA: Diagnosis not present

## 2020-03-16 DIAGNOSIS — Z87442 Personal history of urinary calculi: Secondary | ICD-10-CM

## 2020-03-16 DIAGNOSIS — Z820 Family history of epilepsy and other diseases of the nervous system: Secondary | ICD-10-CM

## 2020-03-16 DIAGNOSIS — I5032 Chronic diastolic (congestive) heart failure: Secondary | ICD-10-CM | POA: Diagnosis present

## 2020-03-16 DIAGNOSIS — N390 Urinary tract infection, site not specified: Secondary | ICD-10-CM | POA: Diagnosis present

## 2020-03-16 DIAGNOSIS — Z20822 Contact with and (suspected) exposure to covid-19: Secondary | ICD-10-CM | POA: Diagnosis present

## 2020-03-16 DIAGNOSIS — E861 Hypovolemia: Secondary | ICD-10-CM | POA: Diagnosis present

## 2020-03-16 DIAGNOSIS — E860 Dehydration: Secondary | ICD-10-CM | POA: Diagnosis present

## 2020-03-16 DIAGNOSIS — E876 Hypokalemia: Secondary | ICD-10-CM | POA: Diagnosis present

## 2020-03-16 DIAGNOSIS — K5900 Constipation, unspecified: Secondary | ICD-10-CM | POA: Diagnosis not present

## 2020-03-16 DIAGNOSIS — R531 Weakness: Secondary | ICD-10-CM | POA: Diagnosis not present

## 2020-03-16 DIAGNOSIS — Z880 Allergy status to penicillin: Secondary | ICD-10-CM | POA: Diagnosis not present

## 2020-03-16 DIAGNOSIS — Z7401 Bed confinement status: Secondary | ICD-10-CM | POA: Diagnosis not present

## 2020-03-16 DIAGNOSIS — Z66 Do not resuscitate: Secondary | ICD-10-CM | POA: Diagnosis present

## 2020-03-16 DIAGNOSIS — N179 Acute kidney failure, unspecified: Secondary | ICD-10-CM

## 2020-03-16 DIAGNOSIS — E039 Hypothyroidism, unspecified: Secondary | ICD-10-CM | POA: Diagnosis present

## 2020-03-16 DIAGNOSIS — I959 Hypotension, unspecified: Secondary | ICD-10-CM | POA: Diagnosis present

## 2020-03-16 DIAGNOSIS — Z950 Presence of cardiac pacemaker: Secondary | ICD-10-CM

## 2020-03-16 DIAGNOSIS — Z79899 Other long term (current) drug therapy: Secondary | ICD-10-CM

## 2020-03-16 DIAGNOSIS — Z87891 Personal history of nicotine dependence: Secondary | ICD-10-CM

## 2020-03-16 DIAGNOSIS — K648 Other hemorrhoids: Secondary | ICD-10-CM | POA: Diagnosis present

## 2020-03-16 DIAGNOSIS — I495 Sick sinus syndrome: Secondary | ICD-10-CM | POA: Diagnosis present

## 2020-03-16 DIAGNOSIS — E162 Hypoglycemia, unspecified: Secondary | ICD-10-CM | POA: Diagnosis present

## 2020-03-16 DIAGNOSIS — K922 Gastrointestinal hemorrhage, unspecified: Secondary | ICD-10-CM | POA: Diagnosis present

## 2020-03-16 DIAGNOSIS — M1611 Unilateral primary osteoarthritis, right hip: Secondary | ICD-10-CM | POA: Diagnosis not present

## 2020-03-16 DIAGNOSIS — K7581 Nonalcoholic steatohepatitis (NASH): Secondary | ICD-10-CM | POA: Diagnosis present

## 2020-03-16 DIAGNOSIS — Z7901 Long term (current) use of anticoagulants: Secondary | ICD-10-CM

## 2020-03-16 DIAGNOSIS — I4821 Permanent atrial fibrillation: Secondary | ICD-10-CM | POA: Diagnosis present

## 2020-03-16 DIAGNOSIS — M353 Polymyalgia rheumatica: Secondary | ICD-10-CM | POA: Diagnosis present

## 2020-03-16 DIAGNOSIS — M2550 Pain in unspecified joint: Secondary | ICD-10-CM | POA: Diagnosis not present

## 2020-03-16 DIAGNOSIS — Z743 Need for continuous supervision: Secondary | ICD-10-CM | POA: Diagnosis not present

## 2020-03-16 DIAGNOSIS — R52 Pain, unspecified: Secondary | ICD-10-CM | POA: Diagnosis not present

## 2020-03-16 DIAGNOSIS — R630 Anorexia: Secondary | ICD-10-CM | POA: Diagnosis present

## 2020-03-16 DIAGNOSIS — N1831 Chronic kidney disease, stage 3a: Secondary | ICD-10-CM | POA: Diagnosis present

## 2020-03-16 DIAGNOSIS — R000 Tachycardia, unspecified: Secondary | ICD-10-CM | POA: Diagnosis not present

## 2020-03-16 DIAGNOSIS — D62 Acute posthemorrhagic anemia: Principal | ICD-10-CM | POA: Diagnosis present

## 2020-03-16 DIAGNOSIS — R6889 Other general symptoms and signs: Secondary | ICD-10-CM | POA: Diagnosis not present

## 2020-03-16 DIAGNOSIS — D649 Anemia, unspecified: Secondary | ICD-10-CM

## 2020-03-16 DIAGNOSIS — R627 Adult failure to thrive: Secondary | ICD-10-CM | POA: Diagnosis present

## 2020-03-16 DIAGNOSIS — Z8673 Personal history of transient ischemic attack (TIA), and cerebral infarction without residual deficits: Secondary | ICD-10-CM

## 2020-03-16 DIAGNOSIS — K649 Unspecified hemorrhoids: Secondary | ICD-10-CM

## 2020-03-16 DIAGNOSIS — Z888 Allergy status to other drugs, medicaments and biological substances status: Secondary | ICD-10-CM

## 2020-03-16 DIAGNOSIS — I10 Essential (primary) hypertension: Secondary | ICD-10-CM | POA: Diagnosis not present

## 2020-03-16 DIAGNOSIS — I130 Hypertensive heart and chronic kidney disease with heart failure and stage 1 through stage 4 chronic kidney disease, or unspecified chronic kidney disease: Secondary | ICD-10-CM | POA: Diagnosis present

## 2020-03-16 DIAGNOSIS — M25551 Pain in right hip: Secondary | ICD-10-CM

## 2020-03-16 DIAGNOSIS — K625 Hemorrhage of anus and rectum: Secondary | ICD-10-CM | POA: Diagnosis not present

## 2020-03-16 DIAGNOSIS — Z79890 Hormone replacement therapy (postmenopausal): Secondary | ICD-10-CM

## 2020-03-16 DIAGNOSIS — I480 Paroxysmal atrial fibrillation: Secondary | ICD-10-CM | POA: Diagnosis not present

## 2020-03-16 DIAGNOSIS — R0902 Hypoxemia: Secondary | ICD-10-CM | POA: Diagnosis not present

## 2020-03-16 DIAGNOSIS — Z8249 Family history of ischemic heart disease and other diseases of the circulatory system: Secondary | ICD-10-CM

## 2020-03-16 DIAGNOSIS — E785 Hyperlipidemia, unspecified: Secondary | ICD-10-CM | POA: Diagnosis present

## 2020-03-16 DIAGNOSIS — Z87440 Personal history of urinary (tract) infections: Secondary | ICD-10-CM

## 2020-03-16 DIAGNOSIS — Z6826 Body mass index (BMI) 26.0-26.9, adult: Secondary | ICD-10-CM

## 2020-03-16 LAB — CBC WITH DIFFERENTIAL/PLATELET
Abs Immature Granulocytes: 0.16 10*3/uL — ABNORMAL HIGH (ref 0.00–0.07)
Band Neutrophils: 0 %
Basophils Absolute: 0 10*3/uL (ref 0.0–0.1)
Basophils Relative: 0 %
Blasts: 0 %
Eosinophils Absolute: 1 10*3/uL — ABNORMAL HIGH (ref 0.0–0.5)
Eosinophils Relative: 0 %
HCT: 24.4 % — ABNORMAL LOW (ref 36.0–46.0)
Hemoglobin: 7.6 g/dL — ABNORMAL LOW (ref 12.0–15.0)
Immature Granulocytes: 1 %
Lymphocytes Relative: 7 %
Lymphs Abs: 1 10*3/uL (ref 0.7–4.0)
MCH: 29.8 pg (ref 26.0–34.0)
MCHC: 31.1 g/dL (ref 30.0–36.0)
MCV: 95.7 fL (ref 80.0–100.0)
Metamyelocytes Relative: 0 %
Monocytes Absolute: 1.2 10*3/uL — ABNORMAL HIGH (ref 0.1–1.0)
Monocytes Relative: 9 %
Myelocytes: 0 %
Neutro Abs: 11.3 10*3/uL — ABNORMAL HIGH (ref 1.7–7.7)
Neutrophils Relative %: 82 %
Platelets: 215 10*3/uL (ref 150–400)
Promyelocytes Relative: 0 %
RBC: 2.55 MIL/uL — ABNORMAL LOW (ref 3.87–5.11)
RDW: 18.1 % — ABNORMAL HIGH (ref 11.5–15.5)
WBC: 13.7 10*3/uL — ABNORMAL HIGH (ref 4.0–10.5)
nRBC: 0 % (ref 0.0–0.2)
nRBC: 0 /100 WBC

## 2020-03-16 LAB — BASIC METABOLIC PANEL
Anion gap: 8 (ref 5–15)
BUN: 19 mg/dL (ref 8–23)
CO2: 21 mmol/L — ABNORMAL LOW (ref 22–32)
Calcium: 8.8 mg/dL — ABNORMAL LOW (ref 8.9–10.3)
Chloride: 105 mmol/L (ref 98–111)
Creatinine, Ser: 1.83 mg/dL — ABNORMAL HIGH (ref 0.44–1.00)
GFR calc Af Amer: 28 mL/min — ABNORMAL LOW (ref >60)
GFR calc non Af Amer: 25 mL/min — ABNORMAL LOW (ref >60)
Glucose, Bld: 86 mg/dL (ref 70–99)
Potassium: 3.2 mmol/L — ABNORMAL LOW (ref 3.5–5.1)
Sodium: 134 mmol/L — ABNORMAL LOW (ref 135–145)

## 2020-03-16 LAB — SARS CORONAVIRUS 2 BY RT PCR (HOSPITAL ORDER, PERFORMED IN ~~LOC~~ HOSPITAL LAB): SARS Coronavirus 2: NEGATIVE

## 2020-03-16 LAB — SURGICAL PATHOLOGY

## 2020-03-16 LAB — MAGNESIUM: Magnesium: 1.8 mg/dL (ref 1.7–2.4)

## 2020-03-16 MED ORDER — ACETAMINOPHEN 325 MG PO TABS
650.0000 mg | ORAL_TABLET | Freq: Once | ORAL | Status: AC
  Administered 2020-03-16: 650 mg via ORAL
  Filled 2020-03-16: qty 2

## 2020-03-16 MED ORDER — SODIUM CHLORIDE 0.9% FLUSH
3.0000 mL | Freq: Two times a day (BID) | INTRAVENOUS | Status: DC
  Administered 2020-03-17 – 2020-03-24 (×11): 3 mL via INTRAVENOUS

## 2020-03-16 MED ORDER — ACETAMINOPHEN 325 MG PO TABS
650.0000 mg | ORAL_TABLET | Freq: Once | ORAL | Status: DC
  Filled 2020-03-16: qty 2

## 2020-03-16 MED ORDER — SODIUM CHLORIDE 0.9 % IV SOLN: 10.0000 mL/h | Freq: Once | INTRAVENOUS | Status: DC

## 2020-03-16 MED ORDER — SODIUM CHLORIDE 0.9 % IV SOLN: INTRAVENOUS | Status: AC

## 2020-03-16 MED ORDER — ONDANSETRON HCL 4 MG/2ML IJ SOLN: 4.0000 mg | Freq: Four times a day (QID) | INTRAMUSCULAR | Status: DC | PRN

## 2020-03-16 MED ORDER — POTASSIUM CHLORIDE 20 MEQ/15ML (10%) PO SOLN
40.0000 meq | Freq: Once | ORAL | Status: AC
  Administered 2020-03-16: 40 meq via ORAL
  Filled 2020-03-16: qty 30

## 2020-03-16 MED ORDER — ACETAMINOPHEN 650 MG RE SUPP: 650.0000 mg | Freq: Four times a day (QID) | RECTAL | Status: DC | PRN

## 2020-03-16 MED ORDER — ONDANSETRON HCL 4 MG PO TABS
4.0000 mg | ORAL_TABLET | Freq: Four times a day (QID) | ORAL | Status: DC | PRN
  Filled 2020-03-16: qty 1

## 2020-03-16 MED ORDER — ACETAMINOPHEN 325 MG PO TABS
650.0000 mg | ORAL_TABLET | Freq: Four times a day (QID) | ORAL | Status: DC | PRN
  Administered 2020-03-17 – 2020-03-23 (×14): 650 mg via ORAL
  Filled 2020-03-16 (×14): qty 2

## 2020-03-16 NOTE — ED Triage Notes (Addendum)
Pt arrived via EMS, from home, had hemorrhoid surgery yesterday, called due to pain, unable to stand or ambulate to baseline.   Hypotensive on EMS arrival, given 876m NS en route

## 2020-03-16 NOTE — ED Provider Notes (Signed)
[facility] DEPT Provider Note   CSN: [id number] Arrival date & time: 03/16/20  1551     History Chief Complaint  Patient presents with  . Post-op Problem  . Hypotension    [name] [name] is a 86 y.o. female with past medical history of A. fib, CVA on Eliquis, CHF, aortic aneurysm, presenting to the emergency department via EMS from home.  Patient had hemorrhoidectomy yesterday by Dr. Marcello Moores.  She states her pain to her rectum is very minimal.  She feels some soreness to bilateral thighs though she feels generally fatigued.  She states her family was worried because she could not get up out of the chair due to her soreness and weakness therefore they called EMS.  She states she has not been significantly bleeding.  She notices a small amount of blood whenever she is moved from her chair.  She has not taken her eliquis since 48 hrs prior to surgery. She has been eating and drinking well since the surgery yesterday.  She denies fevers, abdominal pain, new edema in her legs.  She states for the last couple of weeks her blood pressure has been 128 systolic or lower.  She states they took her off some of her medications after her last admission in August for blood loss anemia secondary to her hemorrhoids.  However, her blood pressure remains low.  This concerns her.  EMS administered 800 cc of normal saline in route.  The history is provided by the patient and medical records.       Past Medical History:  Diagnosis Date  . Anemia due to chronic blood loss 12/2019   rectal bleeding  . Bilateral lower extremity edema   . CAD (coronary artery disease) cardiologist-- dr Caryl Comes   a. LHC 10/2017 30% RCA otherwise OK.  (nonobstructive cad)  . Chronic diastolic CHF (congestive heart failure) ([place])    followed by dr Caryl Comes  . Chronic venous insufficiency   . Hemorrhoids   . History of adenomatous polyp of colon   . History of hemorrhagic cerebrovascular accident (CVA)  without residual deficits 03/25/2015   (previous seen by neuologist--- dr Erlinda Hong   ICH left posterior parietal in setting  HTN/ and pt taking coumadin;   per pt no residual  . History of kidney stones   . History of recurrent UTIs   . HLD (hyperlipidemia)   . HTN (hypertension)    followed by dr Caryl Comes and pcp  . Hypothyroidism    followed by pcp  . NASH (nonalcoholic steatohepatitis)    hx medication induced hepatitis (vytorin per pt)  . OA (osteoarthritis)   . Pacemaker-Medtronic 07/29/2011  implanted   followed by dr Caryl Comes  . Permanent atrial fibrillation [facility])    cardiology--- dr Caryl Comes--- hx several DCCVs  . PMR (polymyalgia rheumatica) ([place])    03-14-2020  per pt has not had any issues in several years  . Rectal bleeding   . sinus node dysfunction//post termination pauses   . SSS (sick sinus syndrome) ([place])    s/p  PPM 07-29-2011  . Thoracic aortic aneurysm (TAA) ([place])    a. followed by Dr. Servando Snare.    Patient Active Problem List   Diagnosis Date Noted  . Lower GI bleed   . Acute blood loss anemia   . Acute lower GI bleeding 02/21/2020  . Normocytic anemia 02/21/2020  . Migraine equivalent 01/06/2017  . Calculus of kidney   . Chronic diastolic CHF (congestive heart failure) ([place])   .  AAA (abdominal aortic aneurysm) without rupture ([place])   . PMR (polymyalgia rheumatica) ([facility])   . Hypothyroidism   . Calculus of ureter   . CAD in native artery   . Other specified hypothyroidism   . Elevated LFTs 04/22/2016  . Hydronephrosis 04/22/2016  . Renal lithiasis 04/22/2016  . UTI (urinary tract infection) 04/22/2016  . Acute kidney injury ([place]) 04/22/2016  . Atrial flutter ([place])   . Nontraumatic cortical hemorrhage of left cerebral hemisphere ([place]) 05/03/2015  . Chronic anticoagulation 05/03/2015  . Cardiac pacemaker in situ 05/03/2015  . Encounter for therapeutic drug monitoring 10/04/2013  . Dyspnea on exertion 07/26/2013  . Chest pain 02/04/2013  . Orthostatic  lightheadedness 06/25/2012  . Pacemaker-Medtronic 11/12/2011  . Ascending aorta enlargement ([place]) 08/12/2011  . Chronic diastolic heart failure ([place]) 12/27/2010  . Long term (current) use of anticoagulants 10/15/2010  . PREMATURE VENTRICULAR CONTRACTIONS 10/17/2009  . post termination pauses/.sinus bradycardia 11/17/2008  . Essential hypertension 04/28/2008  . Atrial fibrillation ([place]) 04/28/2008    Past Surgical History:  Procedure Laterality Date  . BIOPSY  02/23/2020   Procedure: BIOPSY;  Surgeon: Clarene Essex, MD;  Location: [facility] ENDOSCOPY;  Service: Endoscopy;;  . CARDIAC CATHETERIZATION  12-05-2006  dr cooper   nonobstructive cad  . CARDIAC PACEMAKER PLACEMENT  07-29-2011   dr Valaria Good (dual)  . CARDIOVERSION  03/02/2012   Procedure: CARDIOVERSION;  Surgeon: Lelon Perla, MD;  Location: [facility] ENDOSCOPY;  Service: Cardiovascular;  Laterality: N/A;  . CARDIOVERSION  03/13/2012   Procedure: CARDIOVERSION;  Surgeon: Josue Hector, MD;  Location: [facility];  Service: Cardiovascular;  Laterality: N/A;  . CARDIOVERSION  04/15/2012   Procedure: CARDIOVERSION;  Surgeon: [name], MD;  Location: [facility];  Service: Cardiovascular;  Laterality: N/A;  . CARDIOVERSION N/A 06/07/2014   Procedure: CARDIOVERSION;  Surgeon: [name], MD;  Location: [facility] ENDOSCOPY;  Service: Cardiovascular;  Laterality: N/A;  . CARDIOVERSION N/A 08/18/2014   Procedure: CARDIOVERSION;  Surgeon: [name], MD;  Location: [place];  Service: Cardiovascular;  Laterality: N/A;  . CARDIOVERSION N/A 09/06/2015   Procedure: CARDIOVERSION;  Surgeon: Jerline Pain, MD;  Location: [place] [facility];  Service: Cardiovascular;  Laterality: N/A;  . COLONOSCOPY N/A 02/23/2020   Procedure: COLONOSCOPY possible flex sig only;  Surgeon: Clarene Essex, MD;  Location: [facility] ENDOSCOPY;  Service: Endoscopy;  Laterality: N/A;  . CYSTOSCOPY WITH RETROGRADE PYELOGRAM, URETEROSCOPY AND STENT PLACEMENT Right 10/  2017  @[facility]   . CYSTOSCOPY WITH STENT PLACEMENT Right 04/22/2016   Procedure: CYSTOSCOPY WITH RIGHT URETERAL STENT INSERTION;  Surgeon: [name], MD;  Location: [facility];  Service: Urology;  Laterality: Right;  . CYSTOSCOPY WITH URETEROSCOPY, STONE BASKETRY AND STENT PLACEMENT Right 04/30/2016   Procedure: CYSTOSCOPY WITH URETEROSCOPY, STENT PLACEMENT REMOVAL;  Surgeon: [name], MD;  Location: [facility] ORS;  Service: Urology;  Laterality: Right;  . HEMORRHOID SURGERY N/A 03/15/2020   Procedure: 3 COLUMN HEMORRHOIDECTOMY;  Surgeon: Leighton Ruff, MD;  Location[facility];  Service: General;  Laterality: N/A;  . [place]  . LEFT HEART CATH AND CORONARY ANGIOGRAPHY N/A 10/28/2017   Procedure: LEFT HEART CATH AND CORONARY ANGIOGRAPHY;  Surgeon: Lorretta Harp, MD;  Location: [place] CV LAB;  Service: Cardiovascular;  Laterality: N/A;  . PERMANENT PACEMAKER INSERTION N/A 07/29/2011   Procedure: PERMANENT PACEMAKER INSERTION;  Surgeon: [name], MD;  Location: [facility] CATH LAB;  Service: Cardiovascular;  Laterality: N/A;  . UMBILICAL  HERNIA REPAIR  1950's     OB History   No obstetric history on file.     Family History  Problem Relation Age of Onset  . Coronary artery disease Mother   . Coronary artery disease Other   . Alzheimer's disease Other   . Coronary artery disease Brother   . Coronary artery disease Brother     Social History   Tobacco Use  . Smoking status: Former Smoker    Years: 20.00    Types: Cigarettes    Quit date: 07/16/1990    Years since quitting: 29.6  . Smokeless tobacco: Never Used  Vaping Use  . Vaping Use: Never used  Substance Use Topics  . Alcohol use: No  . Drug use: No    Home Medications Prior to Admission medications   Medication Sig Start Date End Date Taking? Authorizing Provider  acetaminophen (TYLENOL) 500 MG tablet Take 1,000 mg by mouth every 6 (six) hours as needed for mild pain.    [provider]  apixaban (ELIQUIS) 5 MG TABS tablet Take 1 tablet (5 mg total) by mouth 2 (two) times daily. 02/27/20   Nita Sells, MD  diltiazem (CARDIZEM CD) 120 MG 24 hr capsule Take 1 capsule (120 mg total) by mouth 2 (two) times daily. Patient taking differently: Take 120 mg by mouth 2 (two) times daily.  02/16/20   [name], MD  hydrocortisone (ANUSOL-HC) 2.5 % rectal cream Place rectally 3 (three) times daily. Patient not taking: Reported on 03/14/2020 02/25/20   Nita Sells, MD  hydrocortisone (ANUSOL-HC) 25 MG suppository Place 1 suppository (25 mg total) rectally at bedtime and may repeat dose one time if needed. Patient not taking: Reported on 03/14/2020 02/25/20   Nita Sells, MD  labetalol (NORMODYNE) 300 MG tablet TAKE 1 TABLET BY MOUTH IN THE MORNING AND 1 & 1/2 TABLET IN THE EVENING. Please make yearly appt with Dr. Caryl Comes for May before anymore refills. 1st attempt Patient taking differently: Take by mouth 2 (two) times daily. TAKE 1 TABLET BY MOUTH IN THE MORNING AND 1 & 1/2 TABLET IN THE EVENING. Please make yearly appt with Dr. Caryl Comes for May before anymore refills. 1st attempt 08/23/19   [name], MD  levothyroxine (SYNTHROID, LEVOTHROID) 25 MCG tablet Take 25 mcg by mouth daily before breakfast.     [provider]  losartan (COZAAR) 25 MG tablet Take 0.5 tablets (12.5 mg total) by mouth daily. Patient taking differently: Take 25 mg by mouth at bedtime.  02/25/20   Nita Sells, MD  oxyCODONE (OXY IR/ROXICODONE) 5 MG immediate release tablet Take 1 tablet (5 mg total) by mouth every 6 (six) hours as needed. 09/13/33   Leighton Ruff, MD  polyethylene glycol (MIRALAX / GLYCOLAX) 17 g packet Take 17 g by mouth QID. 02/25/20   Nita Sells, MD    Allergies    Penicillins, Statins, Tylenol [acetaminophen], Adhesive [tape], Amiodarone, Quinolones, Tape, and Codeine  Review of Systems   Review of Systems  Gastrointestinal:  Positive for anal bleeding. Negative for abdominal pain.  Musculoskeletal: Positive for myalgias.  All other systems reviewed and are negative.   Physical Exam Updated Vital Signs BP (!) 117/53   Pulse 74   Temp 98.1 F (36.7 C) (Oral)   Resp 16   SpO2 98%   Physical Exam Vitals and nursing note reviewed.  Constitutional:      Appearance: She is well-developed.  HENT:     Head: Normocephalic and  atraumatic.  Eyes:     Conjunctiva/sclera: Conjunctivae normal.  Cardiovascular:     Rate and Rhythm: Normal rate.  Pulmonary:     Effort: Pulmonary effort is normal.     Breath sounds: Normal breath sounds.  Abdominal:     General: Bowel sounds are normal.     Palpations: Abdomen is soft.     Tenderness: There is no abdominal tenderness.  Genitourinary:    Comments: Rectal exam performed with privacy curtain in place, NT chaperone present. On visual inspection ,there is no active bleeding present at the anus. Musculoskeletal:     Comments: Tenderness to bilateral anterior proximal thighs.  There is some edema present and bruising to the lower legs.  Patient states this is chronic and unchanged.  Skin:    General: Skin is warm.  Neurological:     Mental Status: She is alert and oriented to person, place, and time. Mental status is at baseline.  Psychiatric:        Behavior: Behavior normal.     ED Results / Procedures / Treatments   Labs (all labs ordered are listed, but only abnormal results are displayed) Labs Reviewed  CBC WITH DIFFERENTIAL/PLATELET - Abnormal; Notable for the following components:      Result Value   WBC 13.7 (*)    RBC 2.55 (*)    Hemoglobin 7.6 (*)    HCT 24.4 (*)    RDW 18.1 (*)    Neutro Abs 11.3 (*)    Monocytes Absolute 1.2 (*)    Eosinophils Absolute 1.0 (*)    Abs Immature Granulocytes 0.16 (*)    All other components within normal limits  BASIC METABOLIC PANEL - Abnormal; Notable for the following components:   Sodium 134 (*)     Potassium 3.2 (*)    CO2 21 (*)    Creatinine, Ser 1.83 (*)    Calcium 8.8 (*)    GFR calc non Af Amer 25 (*)    GFR calc Af Amer 28 (*)    All other components within normal limits  SARS CORONAVIRUS 2 BY RT PCR (HOSPITAL ORDER, [facility])  PREPARE RBC (CROSSMATCH)    EKG None  Radiology No results found.  Procedures .Critical Care Performed by: Charde Macfarlane, [place] N, PA-C Authorized by: Corrion Stirewalt, [place] N, PA-C   Critical care provider statement:    Critical care time (minutes):  45   Critical care time was exclusive of:  Separately billable procedures and treating other patients and teaching time   Critical care was necessary to treat or prevent imminent or life-threatening deterioration of the following conditions:  Shock (symptomatic anemia)   Critical care was time spent personally by me on the following activities:  Discussions with consultants, evaluation of patient's response to treatment, examination of patient, ordering and performing treatments and interventions, ordering and review of laboratory studies, ordering and review of radiographic studies, pulse oximetry, re-evaluation of patient's condition, obtaining history from patient or surrogate and review of old charts   I assumed direction of critical care for this patient from another provider in my specialty: no     (including critical care time)  Medications Ordered in ED Medications  acetaminophen (TYLENOL) tablet 650 mg (650 mg Oral Not Given 03/16/20 1737)  0.9 %  sodium chloride infusion (has no administration in time range)  acetaminophen (TYLENOL) tablet 650 mg (650 mg Oral Given 03/16/20 2102)    ED Course  I have reviewed the triage  vital signs and the nursing notes.  Pertinent labs & imaging results that were available during my care of the patient were reviewed by me and considered in my medical decision making (see chart for details).    MDM Rules/Calculators/A&P                           Pt presenting from home after hemorrhoidectomy yesterday by Dr. Marcello Moores, with generalized weakness/fatigue and soreness to b/l proximal thighs, preventing her from ambulating at home. She reports mild amount of rectal bleeding. She was given 800cc IVF en route tot eh ED by EMS. She is noted to be hypotensive here though unsure of chronicity as  patient states this has been ongoing at least since her admission earlier in August for anemia from bleeding hemorrhoids. Per chart review, her lasix and aldactone were discontinued upon that discharge. She states it did not provide adequate improvement in her BP, and it still runs in the 80s-90s at home since discharge. Here in the ED she is 101/59, mentating properly. Her leg soreness is suspicious for being related to positioning during surgery yesterday. Visually examining her anus, no active bleeding is noted.   Labs with acute anemia with hgb of 74, down from 10.2 yesterday. She is also noted to have an AKI with Cr of 1.83, up from 1.1 yesterday. Leukocytosis is present of 13.7. PRBC ordered for transfusion given symptomatic anemia. Patient will be admitted for anemia and AKI. Dr. Posey Pronto accepting admission.   Final Clinical Impression(s) / ED Diagnoses Final diagnoses:  Symptomatic anemia  AKI (acute kidney injury) [facility])    Rx / DC Orders ED Discharge Orders    None       Kalynne Womac, [place] N, PA-C 03/16/20 2106    Fredia Sorrow, MD 03/18/20 [phone]

## 2020-03-16 NOTE — H&P (Signed)
History and Physical    [name] [name] CLE:[id number] DOB: [birth year] DOA: 03/16/2020  PCP: [name], MD  Patient coming from: Home  I have personally briefly reviewed patient's old medical records in [place]  Chief Complaint: Generalized weakness  HPI: [name] [name] is a 86 y.o. female with medical history significant for permanent atrial fibrillation on Eliquis, SSS s/p PPM, chronic diastolic CHF (EF 94-49% by TTE 09/09/2019), history of left parietal ICH/CVA, nonobstructive CAD, hypothyroidism, HTN, and HLD who presents to the ED for evaluation of generalized weakness.  Patient was recently admitted from 02/21/2020-02/25/2020 for acute lower GI bleeding secondary to internal hemorrhoids.  During admission she was transfused 1 unit PRBC.  She underwent outpatient hemorrhoidectomy by general surgery, Dr. Leighton Ruff, yesterday (03/15/2020).  Eliquis was held 24 hours prior to the procedure.  Preoperative labs were notable for hemoglobin 10.2 and creatinine 1.10 (baseline creatinine 0.8-0.9).  She was discharged to home same day after surgery.  She says this morning she felt generally weak including knee pain at her right upper thigh/hip.  She did not have any injury or fall.  She has seen some scant rectal bleeding which is significantly less compared to prior.  She has not had any chest pain, dyspnea, cough, abdominal pain, nausea, or vomiting.  She has been feeling dehydrated and has been drinking a lot of water.  She is urinating less than she expects.  She has not had any dysuria.  She says she did resume her Eliquis this morning per instructions.  She is brought to the ED via EMS.  Per ED triage notes, she was given 800 cc normal saline by EMS on route to the ED.  ED Course:  Initial vitals showed BP 94/44, pulse 70, RR 18, temp 98.1 Fahrenheit, SPO2 94% on room air.  Labs notable for hemoglobin 7.6, WBC 13.7, platelets 215,000, creatinine 1.83, sodium 134, potassium 3.2,  bicarb 21, BUN 19, serum glucose 86.  SARS-CoV-2 PCR was obtained and pending.  Patient was ordered to receive 1 unit PRBC transfusion.  The hospitalist service was consulted to admit for further evaluation management of AKI.  Review of Systems: All systems reviewed and are negative except as documented in history of present illness above.   Past Medical History:  Diagnosis Date  . Anemia due to chronic blood loss 12/2019   rectal bleeding  . Bilateral lower extremity edema   . CAD (coronary artery disease) cardiologist-- dr Caryl Comes   a. LHC 10/2017 30% RCA otherwise OK.  (nonobstructive cad)  . Chronic diastolic CHF (congestive heart failure) ([place])    followed by dr Caryl Comes  . Chronic venous insufficiency   . Hemorrhoids   . History of adenomatous polyp of colon   . History of hemorrhagic cerebrovascular accident (CVA) without residual deficits 03/25/2015   (previous seen by neuologist--- dr Erlinda Hong   ICH left posterior parietal in setting  HTN/ and pt taking coumadin;   per pt no residual  . History of kidney stones   . History of recurrent UTIs   . HLD (hyperlipidemia)   . HTN (hypertension)    followed by dr Caryl Comes and pcp  . Hypothyroidism    followed by pcp  . NASH (nonalcoholic steatohepatitis)    hx medication induced hepatitis (vytorin per pt)  . OA (osteoarthritis)   . Pacemaker-Medtronic 07/29/2011  implanted   followed by dr Caryl Comes  . Permanent atrial fibrillation [facility])    cardiology--- dr Caryl Comes--- hx several DCCVs  .  PMR (polymyalgia rheumatica) ([place])    03-14-2020  per pt has not had any issues in several years  . Rectal bleeding   . sinus node dysfunction//post termination pauses   . SSS (sick sinus syndrome) ([place])    s/p  PPM 07-29-2011  . Thoracic aortic aneurysm (TAA) ([place])    a. followed by Dr. Servando Snare.    Past Surgical History:  Procedure Laterality Date  . BIOPSY  02/23/2020   Procedure: BIOPSY;  Surgeon: Clarene Essex, MD;  Location: [facility] ENDOSCOPY;  Service:  Endoscopy;;  . CARDIAC CATHETERIZATION  12-05-2006  dr cooper   nonobstructive cad  . CARDIAC PACEMAKER PLACEMENT  07-29-2011   dr Valaria Good (dual)  . CARDIOVERSION  03/02/2012   Procedure: CARDIOVERSION;  Surgeon: Lelon Perla, MD;  Location: [facility] ENDOSCOPY;  Service: Cardiovascular;  Laterality: N/A;  . CARDIOVERSION  03/13/2012   Procedure: CARDIOVERSION;  Surgeon: Josue Hector, MD;  Location: [place];  Service: Cardiovascular;  Laterality: N/A;  . CARDIOVERSION  04/15/2012   Procedure: CARDIOVERSION;  Surgeon: [name], MD;  Location: [place];  Service: Cardiovascular;  Laterality: N/A;  . CARDIOVERSION N/A 06/07/2014   Procedure: CARDIOVERSION;  Surgeon: [name], MD;  Location: [facility] ENDOSCOPY;  Service: Cardiovascular;  Laterality: N/A;  . CARDIOVERSION N/A 08/18/2014   Procedure: CARDIOVERSION;  Surgeon: [name], MD;  Location: [place];  Service: Cardiovascular;  Laterality: N/A;  . CARDIOVERSION N/A 09/06/2015   Procedure: CARDIOVERSION;  Surgeon: Jerline Pain, MD;  Location: [place];  Service: Cardiovascular;  Laterality: N/A;  . COLONOSCOPY N/A 02/23/2020   Procedure: COLONOSCOPY possible flex sig only;  Surgeon: Clarene Essex, MD;  Location: [facility];  Service: Endoscopy;  Laterality: N/A;  . CYSTOSCOPY WITH RETROGRADE PYELOGRAM, URETEROSCOPY AND STENT PLACEMENT Right 10/ 2017  @[facility]   . CYSTOSCOPY WITH STENT PLACEMENT Right 04/22/2016   Procedure: CYSTOSCOPY WITH RIGHT URETERAL STENT INSERTION;  Surgeon: [name], MD;  Location: [place];  Service: Urology;  Laterality: Right;  . CYSTOSCOPY WITH URETEROSCOPY, STONE BASKETRY AND STENT PLACEMENT Right 04/30/2016   Procedure: CYSTOSCOPY WITH URETEROSCOPY, STENT PLACEMENT REMOVAL;  Surgeon: [name], MD;  Location: [facility];  Service: Urology;  Laterality: Right;  . HEMORRHOID SURGERY N/A 03/15/2020   Procedure: 3 COLUMN HEMORRHOIDECTOMY;  Surgeon: Leighton Ruff, MD;  Location: [facility];  Service: General;  Laterality: N/A;  . [place]  . LEFT HEART CATH AND CORONARY ANGIOGRAPHY N/A 10/28/2017   Procedure: LEFT HEART CATH AND CORONARY ANGIOGRAPHY;  Surgeon: Lorretta Harp, MD;  Location: [place] [facility];  Service: Cardiovascular;  Laterality: N/A;  . PERMANENT PACEMAKER INSERTION N/A 07/29/2011   Procedure: PERMANENT PACEMAKER INSERTION;  Surgeon: [name], MD;  Location: [facility] CATH LAB;  Service: Cardiovascular;  Laterality: N/A;  . UMBILICAL HERNIA REPAIR  1950's    Social History:  reports that she quit smoking about 29 years ago. Her smoking use included cigarettes. She quit after 20.00 years of use. She has never used smokeless tobacco. She reports that she does not drink alcohol and does not use drugs.  Allergies  Allergen Reactions  . Penicillins Anaphylaxis    Has patient had a PCN reaction causing immediate rash, facial/tongue/throat swelling, SOB or lightheadedness with hypotension: Yes Has patient had a PCN reaction causing severe rash involving mucus membranes or skin necrosis: No Has patient had a PCN reaction that required hospitalization No Has patient had a PCN  reaction occurring within the last 10 years: No If all of the above answers are "NO", then may proceed with Cephalosporin use.   . Statins Other (See Comments)    REACTION: elevated LFT's  . Tylenol [Acetaminophen] Other (See Comments)    If taken with Vytorin at risk for liver damage  . Adhesive [Tape] Other (See Comments)    Irritates skin  . Amiodarone Other (See Comments)    unk  . Quinolones Other (See Comments)    UNk  . Tape Other (See Comments)    Redness, Please use "paper" tape only.  . Codeine Other (See Comments)    feel funny, head is fuzzy    Family History  Problem Relation Age of Onset  . Coronary artery disease Mother   . Coronary artery disease Other   . Alzheimer's disease Other   . Coronary artery disease  Brother   . Coronary artery disease Brother      Prior to Admission medications   Medication Sig Start Date End Date Taking? Authorizing Provider  acetaminophen (TYLENOL) 500 MG tablet Take 1,000 mg by mouth every 6 (six) hours as needed for mild pain.    [provider]  apixaban (ELIQUIS) 5 MG TABS tablet Take 1 tablet (5 mg total) by mouth 2 (two) times daily. 02/27/20   Nita Sells, MD  diltiazem (CARDIZEM CD) 120 MG 24 hr capsule Take 1 capsule (120 mg total) by mouth 2 (two) times daily. Patient taking differently: Take 120 mg by mouth 2 (two) times daily.  02/16/20   [name], MD  hydrocortisone (ANUSOL-HC) 2.5 % rectal cream Place rectally 3 (three) times daily. Patient not taking: Reported on 03/14/2020 02/25/20   Nita Sells, MD  hydrocortisone (ANUSOL-HC) 25 MG suppository Place 1 suppository (25 mg total) rectally at bedtime and may repeat dose one time if needed. Patient not taking: Reported on 03/14/2020 02/25/20   Nita Sells, MD  labetalol (NORMODYNE) 300 MG tablet TAKE 1 TABLET BY MOUTH IN THE MORNING AND 1 & 1/2 TABLET IN THE EVENING. Please make yearly appt with Dr. Caryl Comes for May before anymore refills. 1st attempt Patient taking differently: Take by mouth 2 (two) times daily. TAKE 1 TABLET BY MOUTH IN THE MORNING AND 1 & 1/2 TABLET IN THE EVENING. Please make yearly appt with Dr. Caryl Comes for May before anymore refills. 1st attempt 08/23/19   [name], MD  levothyroxine (SYNTHROID, LEVOTHROID) 25 MCG tablet Take 25 mcg by mouth daily before breakfast.     [provider]  losartan (COZAAR) 25 MG tablet Take 0.5 tablets (12.5 mg total) by mouth daily. Patient taking differently: Take 25 mg by mouth at bedtime.  02/25/20   Nita Sells, MD  oxyCODONE (OXY IR/ROXICODONE) 5 MG immediate release tablet Take 1 tablet (5 mg total) by mouth every 6 (six) hours as needed. 5/0/93   Leighton Ruff, MD  polyethylene glycol [facility]  Floria Raveling) 17 g packet Take 17 g by mouth QID. 02/25/20   Nita Sells, MD    Physical Exam: Vitals:   03/16/20 2215 03/16/20 2252 03/16/20 2330 03/17/20 0015  BP: (!) 114/48 (!) 101/48 (!) 83/38 (!) 94/39  Pulse: 80 66 73 69  Resp: 16 (!) 21 19 (!) 21  Temp:      TempSrc:      SpO2: 97% 97% 97% 98%   Constitutional: Elderly woman resting supine in bed, NAD, calm, comfortable Eyes: PERRL, lids and conjunctivae normal ENMT: Mucous membranes are  dry. Posterior pharynx clear of any exudate or lesions.Normal dentition.  Neck: normal, supple, no masses. Respiratory: clear to auscultation bilaterally, no wheezing, no crackles. Normal respiratory effort. No accessory muscle use.  Cardiovascular: Regular rate and rhythm, no murmurs / rubs / gallops.  Trace nonpitting bilateral lower extremity edema. 2+ pedal pulses.  PPM in place left chest wall. Abdomen: Mild RLQ tenderness, no masses palpated. No hepatosplenomegaly. Bowel sounds positive.  Musculoskeletal: no clubbing / cyanosis. No joint deformity upper and lower extremities. Good ROM, no contractures. Normal muscle tone.  Skin: Bruising upper and lower extremities bilaterally in various stages, no rashes, ulcers. No induration Neurologic: CN 2-12 grossly intact. Sensation intact, Strength 5/5 in all 4.  Psychiatric: Normal judgment and insight. Alert and oriented x 3. Normal mood.   Labs on Admission: I have personally reviewed following labs and imaging studies  CBC: Recent Labs  Lab 03/15/20 1017 03/16/20 1706  WBC  --  13.7*  NEUTROABS  --  11.3*  HGB 10.2* 7.6*  HCT 30.0* 24.4*  MCV  --  95.7  PLT  --  366   Basic Metabolic Panel: Recent Labs  Lab 03/15/20 1017 03/16/20 1706  NA 142 134*  K 3.5 3.2*  CL 107 105  CO2  --  21*  GLUCOSE 96 86  BUN 11 19  CREATININE 1.10* 1.83*  CALCIUM  --  8.8*  MG  --  1.8   GFR: Estimated Creatinine Clearance: 20.7 mL/min (A) (by C-G formula based on SCr of 1.83 mg/dL  (H)). Liver Function Tests: No results for input(s): AST, ALT, ALKPHOS, BILITOT, PROT, ALBUMIN in the last 168 hours. No results for input(s): LIPASE, AMYLASE in the last 168 hours. No results for input(s): AMMONIA in the last 168 hours. Coagulation Profile: No results for input(s): INR, PROTIME in the last 168 hours. Cardiac Enzymes: No results for input(s): CKTOTAL, CKMB, CKMBINDEX, TROPONINI in the last 168 hours. BNP (last 3 results) No results for input(s): PROBNP in the last 8760 hours. HbA1C: No results for input(s): HGBA1C in the last 72 hours. CBG: No results for input(s): GLUCAP in the last 168 hours. Lipid Profile: No results for input(s): CHOL, HDL, LDLCALC, TRIG, CHOLHDL, LDLDIRECT in the last 72 hours. Thyroid Function Tests: No results for input(s): TSH, T4TOTAL, FREET4, T3FREE, THYROIDAB in the last 72 hours. Anemia Panel: No results for input(s): VITAMINB12, FOLATE, FERRITIN, TIBC, IRON, RETICCTPCT in the last 72 hours. Urine analysis:    Component Value Date/Time   COLORURINE RED (A) 04/29/2016 0130   APPEARANCEUR TURBID (A) 04/29/2016 0130   LABSPEC 1.017 04/29/2016 0130   PHURINE 5.5 04/29/2016 0130   GLUCOSEU NEGATIVE 04/29/2016 0130   HGBUR LARGE (A) 04/29/2016 0130   BILIRUBINUR LARGE (A) 04/29/2016 0130   KETONESUR 15 (A) 04/29/2016 0130   PROTEINUR 100 (A) 04/29/2016 0130   UROBILINOGEN 0.2 01/14/2016 1337   NITRITE NEGATIVE 04/29/2016 0130   LEUKOCYTESUR MODERATE (A) 04/29/2016 0130    Radiological Exams on Admission: No results found.  EKG: Independently reviewed. Atrial fibrillation, rate 76, PVC.  Not significantly changed when compared to prior.  Assessment/Plan Principal Problem:   AKI (acute kidney injury) ([place]) Active Problems:   Essential hypertension   Atrial fibrillation (HCC)   Chronic diastolic heart failure (HCC)   Hypothyroidism   Symptomatic anemia   Hypokalemia  [name] [name] is a 86 y.o. female with medical history  significant for permanent atrial fibrillation on Eliquis, SSS s/p PPM, chronic diastolic CHF (EF 29-47%  by TTE 09/09/2019), history of left parietal ICH/CVA, nonobstructive CAD, hypothyroidism, HTN, and HLD who is admitted with AKI.  Acute kidney injury: Unclear etiology, possibly secondary hypotensive episodes in addition to dehydration and home ARB. -Start IV fluid hydration with 1 L bolus followed by maintenance normal saline overnight -Check urine studies -Check bladder scan and renal ultrasound to assess for retention and/or obstruction -Home hold losartan  Symptomatic anemia Rectal bleeding s/p hemorrhoidectomy 03/15/2020: Patient with perioperative anemia with drop in hemoglobin from 10.2-7.6.  She denies any significant further bleeding. -Transfusing 1 unit PRBC -Hold Eliquis for now and resume as soon as able -Repeat CBC in a.m.  Persistent atrial fibrillation SSS s/p PPM: CHA2DS2-VASc score 6.  Remains in A. fib with controlled rate. -Eliquis on hold as above -Labetalol and Cardizem on hold due to hypotension  Chronic diastolic CHF: EF 12-24% by TTE 09/09/2019.  Appears volume depleted on admission.  Continue IV fluid hydration.  Monitor strict I/O's and daily weights.  Hypokalemia: Replete orally and recheck in a.m.  Hypertension: Diltiazem, labetalol, losartan on hold due to hypotension.  Hypothyroidism: Continue Synthroid.  Hyperlipidemia: Not currently on statin therapy due to previous rise in LFTs.  Generalized weakness: PT/OT eval.  Continue fall precautions.  DVT prophylaxis: SCDs Code Status: DNR, confirmed with patient Family Communication: Discussed with patient, she has discussed with family Disposition Plan: From home, discharge pending improvement in renal function, anemia, and PT/OT eval Consults called: None Admission status:  Status is: Observation  The patient remains OBS appropriate and will d/c before 2 midnights.  Dispo: The patient is from:  Home              Anticipated d/c is to: Pending PT/OT eval              Anticipated d/c date is: 1 day              Patient currently is not medically stable to d/c.  Zada Finders MD [facility]  If 7PM-7AM, please contact night-coverage [email or web address]  03/17/2020, 12:37 AM

## 2020-03-16 NOTE — ED Provider Notes (Signed)
Medical screening examination/treatment/procedure(s) were conducted as a shared visit with non-physician practitioner(s) and myself.  I personally evaluated the patient during the encounter.      Patient status post hemorrhoidectomy yesterday.  Patient sent in for marginal blood pressures.  But serial blood pressures here show that systolic is around 325.  Patient denied any significant bleeding from the hemorrhoidectomy.  But her hemoglobin here today is 7.6 down from 10.1 preop.  And also patient's renal function is significantly worse.  With GFR around less than 25.  With a creatinine up to 1.83.  Patient will require admission and blood transfusion.  We will also require fluids to see if there is correction of the acute kidney injury.  Patient otherwise nontoxic.  Will order blood transfusion while in the emergency department.  CRITICAL CARE Performed by: Fredia Sorrow Total critical care time: 35 minutes Critical care time was exclusive of separately billable procedures and treating other patients. Critical care was necessary to treat or prevent imminent or life-threatening deterioration. Critical care was time spent personally by me on the following activities: development of treatment plan with patient and/or surrogate as well as nursing, discussions with consultants, evaluation of patient's response to treatment, examination of patient, obtaining history from patient or surrogate, ordering and performing treatments and interventions, ordering and review of laboratory studies, ordering and review of radiographic studies, pulse oximetry and re-evaluation of patient's condition. Fredia Sorrow, MD 03/16/20 2043

## 2020-03-17 ENCOUNTER — Observation Stay (HOSPITAL_COMMUNITY): Payer: Medicare PPO

## 2020-03-17 DIAGNOSIS — I480 Paroxysmal atrial fibrillation: Secondary | ICD-10-CM

## 2020-03-17 DIAGNOSIS — I5032 Chronic diastolic (congestive) heart failure: Secondary | ICD-10-CM | POA: Diagnosis not present

## 2020-03-17 DIAGNOSIS — N179 Acute kidney failure, unspecified: Secondary | ICD-10-CM | POA: Diagnosis not present

## 2020-03-17 DIAGNOSIS — D649 Anemia, unspecified: Secondary | ICD-10-CM | POA: Diagnosis not present

## 2020-03-17 LAB — CREATININE, URINE, RANDOM: Creatinine, Urine: 196.68 mg/dL

## 2020-03-17 LAB — SODIUM, URINE, RANDOM: Sodium, Ur: 26 mmol/L

## 2020-03-17 LAB — CBC
HCT: 28.8 % — ABNORMAL LOW (ref 36.0–46.0)
Hemoglobin: 9.2 g/dL — ABNORMAL LOW (ref 12.0–15.0)
MCH: 30.1 pg (ref 26.0–34.0)
MCHC: 31.9 g/dL (ref 30.0–36.0)
MCV: 94.1 fL (ref 80.0–100.0)
Platelets: 204 10*3/uL (ref 150–400)
RBC: 3.06 MIL/uL — ABNORMAL LOW (ref 3.87–5.11)
RDW: 16.9 % — ABNORMAL HIGH (ref 11.5–15.5)
WBC: 11.9 10*3/uL — ABNORMAL HIGH (ref 4.0–10.5)
nRBC: 0 % (ref 0.0–0.2)

## 2020-03-17 LAB — PREPARE RBC (CROSSMATCH)

## 2020-03-17 LAB — BASIC METABOLIC PANEL
Anion gap: 7 (ref 5–15)
BUN: 19 mg/dL (ref 8–23)
CO2: 21 mmol/L — ABNORMAL LOW (ref 22–32)
Calcium: 8.8 mg/dL — ABNORMAL LOW (ref 8.9–10.3)
Chloride: 108 mmol/L (ref 98–111)
Creatinine, Ser: 1.29 mg/dL — ABNORMAL HIGH (ref 0.44–1.00)
GFR calc Af Amer: 43 mL/min — ABNORMAL LOW (ref >60)
GFR calc non Af Amer: 37 mL/min — ABNORMAL LOW (ref >60)
Glucose, Bld: 92 mg/dL (ref 70–99)
Potassium: 4 mmol/L (ref 3.5–5.1)
Sodium: 136 mmol/L (ref 135–145)

## 2020-03-17 LAB — URINALYSIS, ROUTINE W REFLEX MICROSCOPIC
Bilirubin Urine: NEGATIVE
Bilirubin Urine: NEGATIVE
Glucose, UA: NEGATIVE mg/dL
Glucose, UA: NEGATIVE mg/dL
Ketones, ur: 5 mg/dL — AB
Ketones, ur: NEGATIVE mg/dL
Nitrite: NEGATIVE
Nitrite: NEGATIVE
Protein, ur: 30 mg/dL — AB
Protein, ur: 30 mg/dL — AB
Specific Gravity, Urine: 1.017 (ref 1.005–1.030)
Specific Gravity, Urine: 1.013 (ref 1.005–1.030)
WBC, UA: >50 WBC/hpf — ABNORMAL HIGH (ref 0–5)
WBC, UA: >50 WBC/hpf — ABNORMAL HIGH (ref 0–5)
pH: 5 (ref 5.0–8.0)
pH: 5 (ref 5.0–8.0)

## 2020-03-17 LAB — HEPARIN LEVEL (UNFRACTIONATED): Heparin Unfractionated: 1.56 IU/mL — ABNORMAL HIGH (ref 0.30–0.70)

## 2020-03-17 LAB — APTT: aPTT: 99 seconds — ABNORMAL HIGH (ref 24–36)

## 2020-03-17 MED ORDER — HEPARIN (PORCINE) 25000 UT/250ML-% IV SOLN
900.0000 [IU]/h | INTRAVENOUS | Status: DC
  Administered 2020-03-17: 900 [IU]/h via INTRAVENOUS
  Filled 2020-03-17: qty 250

## 2020-03-17 MED ORDER — HEPARIN (PORCINE) 25000 UT/250ML-% IV SOLN: 900.0000 [IU]/h | INTRAVENOUS | Status: DC

## 2020-03-17 MED ORDER — SULFAMETHOXAZOLE-TRIMETHOPRIM 800-160 MG PO TABS
1.0000 | ORAL_TABLET | Freq: Once | ORAL | Status: DC
  Filled 2020-03-17 (×2): qty 1

## 2020-03-17 MED ORDER — LACTATED RINGERS IV BOLUS
1000.0000 mL | Freq: Once | INTRAVENOUS | Status: AC
  Administered 2020-03-17: 1000 mL via INTRAVENOUS

## 2020-03-17 MED ORDER — SULFAMETHOXAZOLE-TRIMETHOPRIM 400-80 MG PO TABS
1.0000 | ORAL_TABLET | Freq: Two times a day (BID) | ORAL | Status: DC
  Filled 2020-03-17: qty 1

## 2020-03-17 MED ORDER — SENNOSIDES-DOCUSATE SODIUM 8.6-50 MG PO TABS
1.0000 | ORAL_TABLET | Freq: Two times a day (BID) | ORAL | Status: DC
  Administered 2020-03-17 – 2020-03-19 (×5): 1 via ORAL
  Filled 2020-03-17 (×5): qty 1

## 2020-03-17 MED ORDER — LEVOTHYROXINE SODIUM 25 MCG PO TABS
25.0000 ug | ORAL_TABLET | Freq: Every day | ORAL | Status: DC
  Administered 2020-03-17 – 2020-03-24 (×7): 25 ug via ORAL
  Filled 2020-03-17 (×8): qty 1

## 2020-03-17 NOTE — ED Notes (Signed)
PT at bedside.

## 2020-03-17 NOTE — Progress Notes (Signed)
ANTICOAGULATION CONSULT NOTE - Initial Consult  Pharmacy Consult for IV heparin Indication: atrial fibrillation (while off Eliquis  Allergies  Allergen Reactions  . Penicillins Anaphylaxis    Has patient had a PCN reaction causing immediate rash, facial/tongue/throat swelling, SOB or lightheadedness with hypotension: Yes Has patient had a PCN reaction causing severe rash involving mucus membranes or skin necrosis: No Has patient had a PCN reaction that required hospitalization No Has patient had a PCN reaction occurring within the last 10 years: No If all of the above answers are "NO", then may proceed with Cephalosporin use.   . Statins Other (See Comments)    REACTION: elevated LFT's  . Tylenol [Acetaminophen] Other (See Comments)    If taken with Vytorin at risk for liver damage  . Adhesive [Tape] Other (See Comments)    Irritates skin  . Amiodarone Other (See Comments)    unk  . Quinolones Other (See Comments)    UNk  . Tape Other (See Comments)    Redness, Please use "paper" tape only.  . Codeine Other (See Comments)    feel funny, head is fuzzy    Patient Measurements:   Heparin Dosing Weight: TBW  Vital Signs: Temp: 98.2 F (36.8 C) (09/03 1152) Temp Source: Oral (09/03 1152) BP: 128/64 (09/03 1152) Pulse Rate: 76 (09/03 1152)  Labs: Recent Labs    03/15/20 1017 03/15/20 1017 03/16/20 1706 03/17/20 0623  HGB 10.2*   < > 7.6* 9.2*  HCT 30.0*  --  24.4* 28.8*  PLT  --   --  215 204  CREATININE 1.10*  --  1.83* 1.29*   < > = values in this interval not displayed.    Estimated Creatinine Clearance: 29.4 mL/min (A) (by C-G formula based on SCr of 1.29 mg/dL (H)).   Medical History: Past Medical History:  Diagnosis Date  . Anemia due to chronic blood loss 12/2019   rectal bleeding  . Bilateral lower extremity edema   . CAD (coronary artery disease) cardiologist-- dr Caryl Comes   a. LHC 10/2017 30% RCA otherwise OK.  (nonobstructive cad)  . Chronic  diastolic CHF (congestive heart failure) ([place])    followed by dr Caryl Comes  . Chronic venous insufficiency   . Hemorrhoids   . History of adenomatous polyp of colon   . History of hemorrhagic cerebrovascular accident (CVA) without residual deficits 03/25/2015   (previous seen by neuologist--- dr Erlinda Hong   ICH left posterior parietal in setting  HTN/ and pt taking coumadin;   per pt no residual  . History of kidney stones   . History of recurrent UTIs   . HLD (hyperlipidemia)   . HTN (hypertension)    followed by dr Caryl Comes and pcp  . Hypothyroidism    followed by pcp  . NASH (nonalcoholic steatohepatitis)    hx medication induced hepatitis (vytorin per pt)  . OA (osteoarthritis)   . Pacemaker-[employer]tr[employer] 07/29/2011  implanted   followed by dr Caryl Comes  . Permanent atrial fibrillation [facility])    cardiology--- dr Caryl Comes--- hx several DCCVs  . PMR (polymyalgia rheumatica) ([place])    03-14-2020  per pt has not had any issues in several years  . Rectal bleeding   . sinus node dysfunction//post termination pauses   . SSS (sick sinus syndrome) ([place])    s/p  PPM 07-29-2011  . Thoracic aortic aneurysm (TAA) ([place])    a. followed by Dr. Servando Snare.    Medications:  (Not in a hospital admission)  Scheduled:  .  levothyroxine  25 mcg Oral Q0600  . senna-docusate  1 tablet Oral BID  . sodium chloride flush  3 mL Intravenous Q12H   Infusions:  . sodium chloride    . heparin      Assessment: 23 yoF presenting with general weakness. PMH permanent Afib on Eliquis, SSS s/p pacer, dCHF, Hx CVA, CAD, HTN, HLD. Recently underwent OP hemorrhoidectomy with Eliquis held 24 hr preop and resumed afterward. Patient now with acute 3.5g drop in Hgb, although no frank bleeding noted. Given concerns for bleeding and new AKI, pharmacy consulted to transition anticoagulation to IV heparin. PRBC ordered.   Baseline INR, aPTT: not done, baseline HL pending  Prior anticoagulation: Eliquis 5 mg PO bid, LD 9/2 ~7-8  AM  Significant events:  9/3: PRBC x 1  Today, 03/17/2020:  CBC: Hgb low but improved after transfusion; Plt stable WNL  SCr mildly elevated about 1.5x baseline  No bleeding or infusion issues per nursing  Goal of Therapy: Heparin level 0.3-0.7 units/ml Monitor platelets by anticoagulation protocol: Yes  Plan:  Heparin 900 units/hr IV infusion, no bolus  Check aPTT 8 hrs after start  Daily CBC and heparin level, aPTT as needed until effects of DOAC on heparin level have dissipated  Monitor for signs of bleeding or thrombosis  Reuel Boom, PharmD, BCPS [phone] 03/17/2020, 12:13 PM

## 2020-03-17 NOTE — Care Management Obs Status (Signed)
[place] NOTIFICATION   Patient Details  Name: [name] [name] MRN: [id number] Date of Birth: Apr 25, 1931   Medicare Observation Status Notification Given:  Yes    Erenest Rasher, RN 03/17/2020, 12:39 PM

## 2020-03-17 NOTE — Progress Notes (Signed)
PROGRESS NOTE    [name] [name]  AYT:[id number] DOB: 05-Feb-1931 DOA: 03/16/2020 PCP: [name], MD    Chief Complaint  Patient presents with  . Post-op Problem  . Hypotension    Brief Narrative:  [name] [name] is a 86 y.o. female with medical history significant for permanent atrial fibrillation on Eliquis, SSS s/p PPM, chronic diastolic CHF (EF 35-57% by TTE 09/09/2019), history of left parietal ICH/CVA, nonobstructive CAD, hypothyroidism, HTN, and HLD who presents to the ED for evaluation of generalized weakness.  Patient was recently admitted from 02/21/2020-02/25/2020 for acute lower GI bleeding secondary to internal hemorrhoids.  During admission she was transfused 1 unit PRBC.  She underwent outpatient hemorrhoidectomy by general surgery, Dr. Leighton Ruff, yesterday (03/15/2020).  Eliquis was held 24 hours prior to the procedure.  Preoperative labs were notable for hemoglobin 10.2 and creatinine 1.10 (baseline creatinine 0.8-0.9).  She was discharged to home same day after surgery.  She says this morning she felt generally weak including knee pain at her right upper thigh/hip.  She did not have any injury or fall.  She has seen some scant rectal bleeding which is significantly less compared to prior.  She has not had any chest pain, dyspnea, cough, abdominal pain, nausea, or vomiting.  She has been feeling dehydrated and has been drinking a lot of water.  She is urinating less than she expects.  She has not had any dysuria.  She says she did resume her Eliquis this morning per instructions.  She is brought to the ED via EMS.  Per ED triage notes, she was given 800 cc normal saline by EMS on route to the ED.  ED Course:  Initial vitals showed BP 94/44, pulse 70, RR 18, temp 98.1 Fahrenheit, SPO2 94% on room air.  Labs notable for hemoglobin 7.6, WBC 13.7, platelets 215,000, creatinine 1.83, sodium 134, potassium 3.2, bicarb 21, BUN 19, serum glucose 86.  SARS-CoV-2 PCR was  obtained and pending.  Patient was ordered to receive 1 unit PRBC transfusion.  The hospitalist service was consulted to admit for further evaluation management of AKI.   Subjective:  RN report blood spotting on pads Patient had not had bowel movement for several days She continue to report feeling poorly, very weak  Husband at bedside   Assessment & Plan:   Principal Problem:   AKI (acute kidney injury) ([place]) Active Problems:   Essential hypertension   Atrial fibrillation ([place])   Chronic diastolic heart failure ([facility])   Hypothyroidism   Symptomatic anemia   Hypokalemia  Symptomatic anemia With recent history of rectal bleeding status post hemorrhoidectomy on September 1 Patient with perioperative anemia with drop in hemoglobin from 10.2-7.6.  She denies any significant further bleeding. -Transfusing 1 unit PRBC, hemoglobin increased to 9.2 today -Eliquis held on admission , we will rechallenge with anticoagulation with heparin drip, close monitor hemoglobin and sign of GI bleed -If no more bleeding and hemoglobin stable on heparin dripx24 hr, plan to resume Eliquis  AKI;  Possible hemodynamically related, she initially presented with hypotension, blood pressure 83/48 11 PM last night, has been borderline hypotension for a few hours from midnight to about 3:00 in the morning  ua is concerning for UTI She is allergic to penicillin and Cipro, will start Bactrim empirically , waiting for urine culture Continue hold losartan  Hypokalemia Replaced and normalized  Persistent atrial fibrillation with h/o CVA SSS s/p PPM: CHA2DS2-VASc score 6.  Remains in A. fib with controlled rate.  Labetalol and Cardizem has been on hold due to hypotension She is currently normal tensive without BP meds , will start labetalol at lower dose with holding parameter -will start heparin drip due to high risk of CVA without anticoagulation   Chronic diastolic CHF: EF 32-67% by TTE 09/09/2019.   Appears volume depleted on admission.  she received IV fluid hydration on admission.   Currently she has bilateral lower extremity edema however I think she is intravascularly dry Will hold off IV hydration, encourage oral intake, elevate legs It appears that Lasix and spironolactone was discontinued on August 13 on her last discharge ,monitor strict I/O's and daily weights.  Hypertension: Diltiazem, labetalol, losartan on hold due to hypotension initially on presentation Today she is normotensive , start low-dose labetalol with holding parameter today  Hypothyroidism: Continue Synthroid.  Hyperlipidemia: Not currently on statin therapy due to previous rise in LFTs.  Failure to thrive She appears very weak to me, will repeat PT eval in the morning  DVT prophylaxis: SCDs Start: 03/16/20 2133   Code Status: DNR Family Communication: Husband at bedside Disposition:   Status is: Inpatient  Dispo: The patient is from: Home              Anticipated d/c is to: To be determined, she  appears very weak              Anticipated d/c date is: To be determined             Currently treating anemia, AKI, possible UTI  Consultants:   None  Procedures:   None  Antimicrobials:        Objective: Vitals:   03/17/20 0937 03/17/20 1000 03/17/20 1036 03/17/20 1044  BP: (!) 133/104 (!) 131/56 (!) 137/55   Pulse: 80 77 87 76  Resp: 15 (!) 22 (!) 23 (!) 24  Temp:    98.2 F (36.8 C)  TempSrc:    Oral  SpO2: 97% 97% 98% 98%    Intake/Output Summary (Last 24 hours) at 03/17/2020 1138 Last data filed at 03/17/2020 1004 Gross per 24 hour  Intake 2360.83 ml  Output --  Net 2360.83 ml   There were no vitals filed for this visit.  Examination:  General exam: Very weak , frail , chronically ill-appearing  Respiratory system: Clear to auscultation. Respiratory effort normal. Cardiovascular system: IRRR, + pedal edema. Gastrointestinal system: Abdomen is nondistended, soft and  nontender. No organomegaly or masses felt. Normal bowel sounds heard. Central nervous system: Alert and oriented. No focal neurological deficits. Extremities: Generalized weakness Skin: Scattered ecchymosis bilateral lower extremities Psychiatry: Judgement and insight appear normal. Mood & affect appropriate.     Data Reviewed: I have personally reviewed following labs and imaging studies  CBC: Recent Labs  Lab 03/15/20 1017 03/16/20 1706 03/17/20 0623  WBC  --  13.7* 11.9*  NEUTROABS  --  11.3*  --   HGB 10.2* 7.6* 9.2*  HCT 30.0* 24.4* 28.8*  MCV  --  95.7 94.1  PLT  --  215 124    Basic Metabolic Panel: Recent Labs  Lab 03/15/20 1017 03/16/20 1706 03/17/20 0623  NA 142 134* 136  K 3.5 3.2* 4.0  CL 107 105 108  CO2  --  21* 21*  GLUCOSE 96 86 92  BUN 11 19 19   CREATININE 1.10* 1.83* 1.29*  CALCIUM  --  8.8* 8.8*  MG  --  1.8  --     GFR: Estimated Creatinine Clearance: 29.4  mL/min (A) (by C-G formula based on SCr of 1.29 mg/dL (H)).  Liver Function Tests: No results for input(s): AST, ALT, ALKPHOS, BILITOT, PROT, ALBUMIN in the last 168 hours.  CBG: No results for input(s): GLUCAP in the last 168 hours.   Recent Results (from the past 240 hour(s))  SARS CORONAVIRUS 2 (TAT 6-24 HRS) Nasopharyngeal Nasopharyngeal Swab     Status: None   Collection Time: 03/13/20  2:57 PM   Specimen: Nasopharyngeal Swab  Result Value Ref Range Status   SARS Coronavirus 2 NEGATIVE NEGATIVE Final    Comment: (NOTE) SARS-CoV-2 target nucleic acids are NOT DETECTED.  The SARS-CoV-2 RNA is generally detectable in upper and lower respiratory specimens during the acute phase of infection. Negative results do not preclude SARS-CoV-2 infection, do not rule out co-infections with other pathogens, and should not be used as the sole basis for treatment or other patient management decisions. Negative results must be combined with clinical observations, patient history, and  epidemiological information. The expected result is Negative.  Fact Sheet for Patients: SugarRoll.be  Fact Sheet for Healthcare Providers: https://www.woods-mathews.com/  This test is not yet approved or cleared by the [place] FDA and  has been authorized for detection and/or diagnosis of SARS-CoV-2 by FDA under an Emergency Use Authorization (EUA). This EUA will remain  in effect (meaning this test can be used) for the duration of the COVID-19 declaration under Se ction 564(b)(1) of the Act, 21 U.S.C. section 360bbb-3(b)(1), unless the authorization is terminated or revoked sooner.  Performed at [facility], [place] [address] [place], [place] [address]   SARS Coronavirus 2 by RT PCR (hospital order, performed in [facility] hospital lab) Nasopharyngeal Nasopharyngeal Swab     Status: None   Collection Time: 03/16/20  8:42 PM   Specimen: Nasopharyngeal Swab  Result Value Ref Range Status   SARS Coronavirus 2 NEGATIVE NEGATIVE Final    Comment: (NOTE) SARS-CoV-2 target nucleic acids are NOT DETECTED.  The SARS-CoV-2 RNA is generally detectable in upper and lower respiratory specimens during the acute phase of infection. The lowest concentration of SARS-CoV-2 viral copies this assay can detect is 250 copies / mL. A negative result does not preclude SARS-CoV-2 infection and should not be used as the sole basis for treatment or other patient management decisions.  A negative result may occur with improper specimen collection / handling, submission of specimen other than nasopharyngeal swab, presence of viral mutation(s) within the areas targeted by this assay, and inadequate number of viral copies (<250 copies / mL). A negative result must be combined with clinical observations, patient history, and epidemiological information.  Fact Sheet for Patients:   StrictlyIdeas.no  Fact Sheet for Healthcare  Providers: BankingDealers.co.za  This test is not yet approved or  cleared by the [place] FDA and has been authorized for detection and/or diagnosis of SARS-CoV-2 by FDA under an Emergency Use Authorization (EUA).  This EUA will remain in effect (meaning this test can be used) for the duration of the COVID-19 declaration under Section 564(b)(1) of the Act, 21 U.S.C. section 360bbb-3(b)(1), unless the authorization is terminated or revoked sooner.  Performed at [facility], [place] [address], [place], [place] [address]          Radiology Studies: US RENAL  Result Date: 03/17/2020 CLINICAL DATA:  86 year old female with acute kidney injury. EXAM: RENAL / URINARY TRACT ULTRASOUND COMPLETE COMPARISON:  04/22/2016 ultrasound FINDINGS: Right Kidney: Renal measurements: 10.8 x 5.3 x 5.4  cm = volume: 160 mL. Moderate cortical atrophy noted. A 1.9 cm UPPER pole cyst is present. No solid mass or hydronephrosis identified. Echogenicity is within normal limits. Left Kidney: Renal measurements: 11.7 x 6.1 x 5.5 cm = volume: 202 mL. Moderate cortical atrophy noted. Echogenicity within normal limits. No mass or hydronephrosis visualized. Bladder: Appears normal for degree of bladder distention. Other: None. IMPRESSION: 1. Moderate bilateral renal cortical atrophy. 2. No evidence of hydronephrosis. Electronically Signed   By: Margarette Canada M.D.   On: 03/17/2020 07:59        Scheduled Meds: . levothyroxine  25 mcg Oral Q0600  . sodium chloride flush  3 mL Intravenous Q12H   Continuous Infusions: . sodium chloride       LOS: 0 days   Time spent: 84mns Greater than 50% of this time was spent in counseling, explanation of diagnosis, planning of further management, and coordination of care.  I have personally reviewed and interpreted on  03/17/2020 daily labs, tele strips, imagings as discussed above under date review session and assessment and  plans.  I reviewed all nursing notes, pharmacy notes, consultant notes,  vitals, pertinent old records  I have discussed plan of care as described above with RN , patient and family on 03/17/2020  Voice Recognition /Dragon dictation system was used to create this note, attempts have been made to correct errors. Please contact the author with questions and/or clarifications.   FFlorencia Reasons MD PhD FACP [facility]  Available via Epic secure chat 7am-7pm for nonurgent issues Please page for urgent issues To page the attending provider between 7A-7P or the covering provider during after hours 7P-7A, please log into the web site [email or web address] and access using universal [place] password for that web site. If you do not have the password, please call the hospital operator.    03/17/2020, 11:38 AM

## 2020-03-17 NOTE — Evaluation (Signed)
Occupational Therapy Evaluation Patient Details Name: [name] [name] MRN: [id number] DOB: [birth year] Today's Date: 03/17/2020    History of Present Illness [name] [name] is a 86 y.o. female with medical history significant for permanent atrial fibrillation on Eliquis, SSS s/p PPM, chronic diastolic CHF (EF 17-51% by TTE 09/09/2019), history of left parietal ICH/CVA, nonobstructive CAD, hypothyroidism, HTN, and HLD . Patient was recently admitted from 02/21/2020-02/25/2020 for acute lower GI bleeding secondary to internal hemorrhoids  She underwent outpatient hemorrhoidectomy (03/15/2020) and was discharged home. returned to ER for generalized weakness.   Clinical Impression   [name] [name] presents with generalized weakness, decreased activity tolerance and complaints of pain resulting in decreased ability to perform baseline mobility and independence with ADLs. Patient exhibits functional ROM of upper extremities and required mod assist for supine to sit off of stretcher and mod x 2 for sit to supnie (guiding of extremities and verbal cues mostly). Patient anxious and reporting back pain, hip and rectum pain that limited participation today and patient somewhat self limiting. Patient required max assist for lower body ADLs and set up for bed level ADLs - unable to tolerate sitting position due to pain. Patient will benefit from skilled OT services to improve deficits and learn compensatory strategies as needed in order to return home at discharge. Expect that patient will improve with pain control and reduction of anxiety in order to return home at discharge with [facility] services.    Follow Up Recommendations  Home health OT    Equipment Recommendations  None recommended by OT    Recommendations for Other Services       Precautions / Restrictions Precautions Precautions: None Restrictions Weight Bearing Restrictions: No      Mobility Bed Mobility Overal bed mobility: Needs Assistance Bed  Mobility: Supine to Sit;Sit to Supine     Supine to sit: HOB elevated;Mod assist Sit to supine: +2 for safety/equipment;Mod assist      Transfers Overall transfer level: Needs assistance   Transfers: Sit to/from Stand;Stand Pivot Transfers Sit to Stand: Min guard Stand pivot transfers: Min guard            Balance Overall balance assessment: Mild deficits observed, not formally tested                                         ADL either performed or assessed with clinical judgement   ADL Overall ADL's : Needs assistance/impaired Eating/Feeding: Independent   Grooming: Set up;Bed level   Upper Body Bathing: Set up;Bed level   Lower Body Bathing: Maximal assistance;Bed level   Upper Body Dressing : Set up;Bed level   Lower Body Dressing: Maximal assistance;Bed level   Toilet Transfer: RW;BSC;Stand-pivot;Min guard   Toileting- Clothing Manipulation and Hygiene: Min guard;Maximal assistance;Sit to/from stand               Vision Baseline Vision/History: Wears glasses Wears Glasses: At all times Patient Visual Report: No change from baseline       Perception     Praxis      Pertinent Vitals/Pain Pain Assessment: 0-10 Pain Score: 6  Pain Location: rectum Pain Descriptors / Indicators: Discomfort;Sore;Guarding Pain Intervention(s): Limited activity within patient's tolerance;Monitored during session;Patient requesting pain meds-RN notified     Hand Dominance Right   Extremity/Trunk Assessment Upper Extremity Assessment Upper Extremity Assessment: Overall WFL for tasks assessed   Lower Extremity Assessment  Lower Extremity Assessment: Defer to PT evaluation   Cervical / Trunk Assessment Cervical / Trunk Assessment: Normal   Communication Communication Communication: No difficulties   Cognition Arousal/Alertness: Awake/alert Behavior During Therapy: Anxious Overall Cognitive Status: Within Functional Limits for tasks assessed                                      General Comments       Exercises     Shoulder Instructions      Home Living Family/patient expects to be discharged to:: Private residence Living Arrangements: Spouse/significant other Available Help at Discharge: Family;Available 24 hours/day Type of Home: House Home Access: Stairs to enter [employer] of Steps: 2   Home Layout: One level     Bathroom Shower/Tub: Tub/shower unit;Walk-in shower (uses walk in shower)   Bathroom Toilet: Handicapped height Bathroom Accessibility: Yes   Home Equipment: Environmental consultant - 2 wheels;Cane - single point   Additional Comments: Was getting [place] PT prior to surgery. Had hemorrhoid surgery and reports "not been the same since."      Prior Functioning/Environment Level of Independence: Independent                 OT Problem List: Decreased activity tolerance;Impaired balance (sitting and/or standing);Decreased knowledge of use of DME or AE;Pain      OT Treatment/Interventions: Self-care/ADL training;Patient/family education;Therapeutic activities;Balance training;DME and/or AE instruction    OT Goals(Current goals can be found in the care plan section) Acute Rehab OT Goals Patient Stated Goal: Less pain OT Goal Formulation: With patient Time For Goal Achievement: 03/31/20 Potential to Achieve Goals: Good  OT Frequency: Min 2X/week   Barriers to D/C:            Co-evaluation PT/OT/SLP Co-Evaluation/Treatment: Yes Reason for Co-Treatment: To address functional/ADL transfers;For patient/therapist safety PT goals addressed during session: Mobility/safety with mobility OT goals addressed during session: ADL's and self-care      AM-PAC OT "6 Clicks" Daily Activity     Outcome Measure Help from another person eating meals?: None Help from another person taking care of personal grooming?: A Little Help from another person toileting, which includes using toliet, bedpan,  or urinal?: A Lot Help from another person bathing (including washing, rinsing, drying)?: A Lot Help from another person to put on and taking off regular upper body clothing?: A Little Help from another person to put on and taking off regular lower body clothing?: A Lot 6 Click Score: 16   End of Session Equipment Utilized During Treatment: Rolling walker Nurse Communication: Mobility status  Activity Tolerance: Patient limited by pain Patient left: in bed;with call bell/phone within reach  OT Visit Diagnosis: Pain;History of falling (Z91.81) Pain - part of body:  (rectum)                Time: 3833-3832 OT Time Calculation (min): 24 min Charges:  OT General Charges $OT Visit: 1 Visit OT Evaluation $OT Eval Low Complexity: 1 Low  Kensy Blizard, OTR/L [place]  Office [phone] Pager: [place] 03/17/2020, 9:33 AM

## 2020-03-17 NOTE — Progress Notes (Signed)
PHARMACY NOTE:  ANTIMICROBIAL RENAL DOSAGE ADJUSTMENT  Current antimicrobial regimen includes a mismatch between antimicrobial dosage and estimated renal function.  As per policy approved by the Pharmacy & Therapeutics and Medical Executive Committees, the antimicrobial dosage will be adjusted accordingly.  Current antimicrobial dosage:  Septra DS bid  Indication: presumed UTI, Urine cx pending  Renal Function:   Estimated Creatinine Clearance: 29.4 mL/min (A) (by C-G formula based on SCr of 1.29 mg/dL (H)). []      On intermittent HD, scheduled: []      On CRRT    Antimicrobial dosage has been changed to:  Septra DS x1, followed by Septra SS bid  Additional Comments: If clearance consistently above 30 ml/min, could consider increase to Septra DS bid. Follow urine culture  Thank you for allowing pharmacy to be a part of this patient's care.  Minda Ditto PharmD 03/17/2020 7:43 PM

## 2020-03-17 NOTE — TOC Initial Note (Addendum)
Transition of Care [facility]) - Initial/Assessment Note    Patient Details  Name: [name] [name] MRN: [id number] Date of Birth: [birth year]  Transition of Care [facility]) CM/SW Contact:    Erenest Rasher, RN Phone Number: [phone] 03/17/2020, 12:44 PM  Clinical Narrative:                 TOC CM received referral to arrange [facility]. Pt did have [place] in the past. Referral sent to Lattie Haw, [facility] with referral. Pt has RW and bedside commode at home. Attending updated. Will need Home Health order for [facility], PT, OT with F2F.   Expected Discharge Plan: [place] Barriers to Discharge: Continued Medical Work up   Patient Goals and CMS Choice Patient states their goals for this hospitalization and ongoing recovery are:: will be able to care for patient at home CMS Medicare.gov Compare Post Acute Care list provided to:: Patient Represenative (must comment) Cherre Huger) Choice offered to / list presented to : Spouse  Expected Discharge Plan and Services Expected Discharge Plan: [place] In-house Referral: Clinical Social Work Discharge Planning Services: CM Consult Post Acute Care Choice: [place] arrangements for the past 2 months: [place]: PT, OT, RN [place] Agency: [facility] ([facility] [employer]) Date [place]: 03/17/20 Time [place]: 1243 Representative spoke with at [place]: Deatra Ina  Prior Living Arrangements/Services Living arrangements for the past 2 months: [place] Lives with:: Spouse Patient language and need for interpreter reviewed:: Yes Do you feel safe going back to the place where you live?: Yes      Need for Family Participation in Patient Care: Yes (Comment) Care giver support system in place?: Yes (comment) Current home services: DME (rollator. bedside commode) Criminal Activity/Legal Involvement Pertinent  to Current Situation/Hospitalization: No - Comment as needed  Activities of Daily Living Home Assistive Devices/Equipment: Cane (specify quad or straight) (straight) ADL Screening (condition at time of admission) Patient's cognitive ability adequate to safely complete daily activities?: No Is the patient deaf or have difficulty hearing?: No Does the patient have difficulty seeing, even when wearing glasses/contacts?: No Does the patient have difficulty concentrating, remembering, or making decisions?: No Patient able to express need for assistance with ADLs?: No Does the patient have difficulty dressing or bathing?: No Independently performs ADLs?: No Communication: Independent Dressing (OT): Independent Grooming: Independent Feeding: Independent Bathing: Independent Toileting: Independent with device (comment) (uses walking stick at home) In/Out Bed: Independent Walks in Home: Dependent (pt states she can't walk today) Is this a change from baseline?: Change from baseline, expected to last >3 days Does the patient have difficulty walking or climbing stairs?: No Weakness of Legs: None Weakness of Arms/Hands: None  Permission Sought/Granted Permission sought to share information with : Case Manager, PCP, Family Supports Permission granted to share information with : Yes, Verbal Permission Granted  Share Information with NAME: Amiayah Giebel  Permission granted to share info w AGENCY: [place] granted to share info w Relationship: husband  Permission granted to share info w Contact Information: [phone]  Emotional Assessment       Orientation: : Oriented to Self, Oriented to Place, Oriented to  Time, Oriented to Situation   Psych Involvement: No (comment)  Admission  diagnosis:  AKI (acute kidney injury) ([place]) [N17.9] Symptomatic anemia [D64.9] Patient Active Problem List   Diagnosis Date Noted   AKI (acute kidney injury) ([place]) 03/16/2020   Hypokalemia  03/16/2020   Lower GI bleed    Acute blood loss anemia    Acute lower GI bleeding 02/21/2020   Symptomatic anemia 02/21/2020   Migraine equivalent 01/06/2017   Calculus of kidney    Chronic diastolic CHF (congestive heart failure) ([place])    AAA (abdominal aortic aneurysm) without rupture (HCC)    PMR (polymyalgia rheumatica) (HCC)    Hypothyroidism    Calculus of ureter    CAD in native artery    Other specified hypothyroidism    Elevated LFTs 04/22/2016   Hydronephrosis 04/22/2016   Renal lithiasis 04/22/2016   UTI (urinary tract infection) 04/22/2016   Acute kidney injury ([place]) 04/22/2016   Atrial flutter ([place])    Nontraumatic cortical hemorrhage of left cerebral hemisphere ([place]) 05/03/2015   Chronic anticoagulation 05/03/2015   Cardiac pacemaker in situ 05/03/2015   Encounter for therapeutic drug monitoring 10/04/2013   Dyspnea on exertion 07/26/2013   Chest pain 02/04/2013   Orthostatic lightheadedness 06/25/2012   Pacemaker-Medtronic 11/12/2011   Ascending aorta enlargement ([place]) 08/12/2011   Chronic diastolic heart failure ([place]) 12/27/2010   Long term (current) use of anticoagulants 10/15/2010   PREMATURE VENTRICULAR CONTRACTIONS 10/17/2009   post termination pauses/.sinus bradycardia 11/17/2008   Essential hypertension 04/28/2008   Atrial fibrillation ([place]) 04/28/2008   PCP:  [name], MD Pharmacy:   [facility], [place] - [id number] [place] [address] Phone: [phone] Fax: [phone]     Social Determinants of Health (SDOH) Interventions    Readmission Risk Interventions No flowsheet data found.

## 2020-03-17 NOTE — Care Management CC44 (Signed)
Condition Code 44 Documentation Completed  Patient Details  Name: [name] [name] MRN: [id number] Date of Birth: September 23, 1930   Condition Code 44 given:  Yes Patient signature on Condition Code 44 notice:  Yes Documentation of 2 MD's agreement:  Yes Code 44 added to claim:  Yes    Erenest Rasher, RN 03/17/2020, 12:39 PM

## 2020-03-17 NOTE — ED Notes (Signed)
Patient received breakfast tray/ husband at bedside

## 2020-03-17 NOTE — Evaluation (Signed)
Physical Therapy Evaluation Patient Details Name: [name] [name] MRN: [id number] DOB: 01-30-1931 Today's Date: 03/17/2020   History of Present Illness  [name] [name] is a 86 y.o. female with medical history significant for permanent atrial fibrillation on Eliquis, SSS s/p PPM, chronic diastolic CHF (EF 16-01% by TTE 09/09/2019), history of left parietal ICH/CVA, nonobstructive CAD, hypothyroidism, HTN, and HLD . Patient was recently admitted from 02/21/2020-02/25/2020 for acute lower GI bleeding secondary to internal hemorrhoids  She underwent outpatient hemorrhoidectomy (03/15/2020) and was discharged home. returned to ER for generalized weakness.  Clinical Impression  Mrs. [name] [name] presents with generalized weakness, decreased activity tolerance and complaints of pain resulting in decreased ability to perform baseline mobility. Patient exhibits functional ROM of extremities and required mod assist for supine to sit off of stretcher and mod x 2 for sit to supine. Patient with elevated anxiety level and reporting back, hip and rectum pain that limited participation today.  With improved pain control, pt should progress to dc home with assist of spouse and would benefit from follow up HHPT to further address deficits.    Follow Up Recommendations Home health PT    Equipment Recommendations  None recommended by PT    Recommendations for Other Services       Precautions / Restrictions Precautions Precautions: None Restrictions Weight Bearing Restrictions: No      Mobility  Bed Mobility Overal bed mobility: Needs Assistance Bed Mobility: Supine to Sit;Sit to Supine     Supine to sit: HOB elevated;Mod assist Sit to supine: +2 for safety/equipment;Mod assist   General bed mobility comments: increased time with assist to manage LEs and to control trunk  Transfers Overall transfer level: Needs assistance Equipment used: Rolling walker (2 wheeled) Transfers: Sit to/from [employer] Sit to Stand: Min assist;Min guard Stand pivot transfers: Min guard       General transfer comment: INcreased time with cues for use of UEs to self assist  Ambulation/Gait Ambulation/Gait assistance: Min assist Gait Distance (Feet): 3 Feet Assistive device: Rolling walker (2 wheeled) Gait Pattern/deviations: Step-to pattern;Decreased step length - right;Decreased step length - left;Shuffle;Trunk flexed Gait velocity: decrea   General Gait Details: INcreased time.  Pt stood x 3 and tolerated side- step up side of bed only but requesting to sit 2* increased rectal pain in standing  Stairs            Wheelchair Mobility    Modified Rankin (Stroke Patients Only)       Balance Overall balance assessment: Mild deficits observed, not formally tested                                           Pertinent Vitals/Pain Pain Assessment: 0-10 Pain Score: 6  Pain Location: rectum - increased with standing Pain Descriptors / Indicators: Discomfort;Sore;Guarding;Grimacing Pain Intervention(s): Limited activity within patient's tolerance;Monitored during session;Patient requesting pain meds-RN notified;Repositioned    Home Living Family/patient expects to be discharged to:: Private residence Living Arrangements: Spouse/significant other Available Help at Discharge: Family;Available 24 hours/day Type of Home: House Home Access: Stairs to enter Entrance Stairs-Rails: None Entrance Stairs-Number of Steps: 2 Home Layout: One level Home Equipment: Walker - 2 wheels;Cane - single point Additional Comments: Was getting [place] PT prior to surgery. Had hemorrhoid surgery and reports "not been the same since."    Prior Function Level of Independence: Independent  Hand Dominance   Dominant Hand: Right    Extremity/Trunk Assessment   Upper Extremity Assessment Upper Extremity Assessment: Overall WFL for tasks assessed    Lower  Extremity Assessment Lower Extremity Assessment: Overall WFL for tasks assessed    Cervical / Trunk Assessment Cervical / Trunk Assessment: Normal  Communication   Communication: No difficulties  Cognition Arousal/Alertness: Awake/alert Behavior During Therapy: Anxious Overall Cognitive Status: Within Functional Limits for tasks assessed                                        General Comments      Exercises     Assessment/Plan    PT Assessment Patient needs continued PT services  PT Problem List Decreased activity tolerance;Decreased balance;Decreased mobility;Decreased knowledge of use of DME;Pain       PT Treatment Interventions DME instruction;Gait training;Stair training;Functional mobility training;Therapeutic activities;Therapeutic exercise;Patient/family education    PT Goals (Current goals can be found in the Care Plan section)  Acute Rehab PT Goals Patient Stated Goal: Less pain PT Goal Formulation: With patient Time For Goal Achievement: 03/31/20 Potential to Achieve Goals: Good    Frequency Min 3X/week   Barriers to discharge        Co-evaluation PT/OT/SLP Co-Evaluation/Treatment: Yes Reason for Co-Treatment: To address functional/ADL transfers PT goals addressed during session: Mobility/safety with mobility OT goals addressed during session: ADL's and self-care       AM-PAC PT "6 Clicks" Mobility  Outcome Measure Help needed turning from your back to your side while in a flat bed without using bedrails?: A Little Help needed moving from lying on your back to sitting on the side of a flat bed without using bedrails?: A Lot Help needed moving to and from a bed to a chair (including a wheelchair)?: A Lot Help needed standing up from a chair using your arms (e.g., wheelchair or bedside chair)?: A Little Help needed to walk in hospital room?: A Lot Help needed climbing 3-5 steps with a railing? : A Lot 6 Click Score: 14    End of  Session Equipment Utilized During Treatment: Gait belt Activity Tolerance: Patient limited by pain Patient left: in bed;with call bell/phone within reach Nurse Communication: Mobility status PT Visit Diagnosis: Unsteadiness on feet (R26.81);Difficulty in walking, not elsewhere classified (R26.2);Pain Pain - part of body:  (rectum)    Time: 8185-6314 PT Time Calculation (min) (ACUTE ONLY): 25 min   Charges:   PT Evaluation $PT Eval Low Complexity: 1 Low          Debe Coder PT Acute Rehabilitation Services Pager [phone] Office [phone]   Silvina Hackleman 03/17/2020, 11:05 AM

## 2020-03-17 NOTE — Progress Notes (Signed)
Pharmacy Brief Note - Anticoagulation Follow Up:  Patient on a heparin drip for atrial fibrillation while apixaban on hold.   Assessment:  APTT = 99 seconds is therapeutic on heparin infusion of 900 units/hr  Confirmed with RN that heparin infusing at correct rate. No signs of bleeding.   Goal: aPTT 66-102 seconds  Plan:  Continue heparin infusion at current rate of 900 units/hr  Check confirmatory aPTT in 8 hours  HL, aPTT daily. Once they correlate, can monitor using HL only  Monitor for signs of bleeding  Lenis Noon, PharmD 03/17/20 9:21 PM

## 2020-03-17 NOTE — Progress Notes (Signed)
Patient not on home bp/afib meds here (presuming due to low bp on admission). Will continue to monitor.  Patient did not want to take new antibiotic pill without first speaking with provider about it per pt.  Patient states she hasnt had a BM in at least 5 days. Ordered bowel med given. Pt concerned she will not make it to toliet/ be able to get up. Pt. Instructed to call and a bed pan left at bedside when needed.

## 2020-03-18 ENCOUNTER — Observation Stay (HOSPITAL_COMMUNITY): Payer: Medicare PPO

## 2020-03-18 DIAGNOSIS — M1611 Unilateral primary osteoarthritis, right hip: Secondary | ICD-10-CM | POA: Diagnosis not present

## 2020-03-18 DIAGNOSIS — N179 Acute kidney failure, unspecified: Secondary | ICD-10-CM | POA: Diagnosis not present

## 2020-03-18 LAB — BASIC METABOLIC PANEL
Anion gap: 8 (ref 5–15)
BUN: 15 mg/dL (ref 8–23)
CO2: 21 mmol/L — ABNORMAL LOW (ref 22–32)
Calcium: 9 mg/dL (ref 8.9–10.3)
Chloride: 111 mmol/L (ref 98–111)
Creatinine, Ser: 0.92 mg/dL (ref 0.44–1.00)
GFR calc Af Amer: >60 mL/min (ref >60)
GFR calc non Af Amer: 56 mL/min — ABNORMAL LOW (ref >60)
Glucose, Bld: 70 mg/dL (ref 70–99)
Potassium: 3.9 mmol/L (ref 3.5–5.1)
Sodium: 140 mmol/L (ref 135–145)

## 2020-03-18 LAB — TYPE AND SCREEN
ABO/RH(D): A POS
Antibody Screen: NEGATIVE
Unit division: 0

## 2020-03-18 LAB — CBC
HCT: 29.3 % — ABNORMAL LOW (ref 36.0–46.0)
Hemoglobin: 9.2 g/dL — ABNORMAL LOW (ref 12.0–15.0)
MCH: 29.4 pg (ref 26.0–34.0)
MCHC: 31.4 g/dL (ref 30.0–36.0)
MCV: 93.6 fL (ref 80.0–100.0)
Platelets: 210 10*3/uL (ref 150–400)
RBC: 3.13 MIL/uL — ABNORMAL LOW (ref 3.87–5.11)
RDW: 17.5 % — ABNORMAL HIGH (ref 11.5–15.5)
WBC: 11.4 10*3/uL — ABNORMAL HIGH (ref 4.0–10.5)
nRBC: 0 % (ref 0.0–0.2)

## 2020-03-18 LAB — MAGNESIUM: Magnesium: 1.7 mg/dL (ref 1.7–2.4)

## 2020-03-18 LAB — BPAM RBC
Blood Product Expiration Date: 202109192359
ISSUE DATE / TIME: 202109030200
Unit Type and Rh: 6200

## 2020-03-18 LAB — APTT: aPTT: 86 seconds — ABNORMAL HIGH (ref 24–36)

## 2020-03-18 LAB — HEPARIN LEVEL (UNFRACTIONATED): Heparin Unfractionated: 0.96 IU/mL — ABNORMAL HIGH (ref 0.30–0.70)

## 2020-03-18 MED ORDER — MAGNESIUM HYDROXIDE 400 MG/5ML PO SUSP
30.0000 mL | Freq: Two times a day (BID) | ORAL | Status: AC
  Administered 2020-03-18 (×2): 30 mL via ORAL
  Filled 2020-03-18 (×2): qty 30

## 2020-03-18 MED ORDER — LIDOCAINE 5 % EX PTCH
1.0000 | MEDICATED_PATCH | CUTANEOUS | Status: DC
  Administered 2020-03-18: 1 via TRANSDERMAL
  Filled 2020-03-18: qty 1

## 2020-03-18 MED ORDER — LIDOCAINE 4 % EX CREA
TOPICAL_CREAM | Freq: Two times a day (BID) | CUTANEOUS | Status: DC | PRN
  Administered 2020-03-19: 2 via TOPICAL
  Administered 2020-03-20 – 2020-03-23 (×3): 1 via TOPICAL
  Filled 2020-03-18 (×3): qty 5

## 2020-03-18 MED ORDER — SULFAMETHOXAZOLE-TRIMETHOPRIM 800-160 MG PO TABS
1.0000 | ORAL_TABLET | Freq: Two times a day (BID) | ORAL | Status: DC
  Administered 2020-03-18 – 2020-03-21 (×7): 1 via ORAL
  Filled 2020-03-18 (×6): qty 1

## 2020-03-18 MED ORDER — MAGNESIUM SULFATE 2 GM/50ML IV SOLN
2.0000 g | Freq: Once | INTRAVENOUS | Status: AC
  Administered 2020-03-18: 2 g via INTRAVENOUS
  Filled 2020-03-18: qty 50

## 2020-03-18 MED ORDER — LABETALOL HCL 100 MG PO TABS
100.0000 mg | ORAL_TABLET | Freq: Two times a day (BID) | ORAL | Status: DC
  Administered 2020-03-18 – 2020-03-19 (×3): 100 mg via ORAL
  Filled 2020-03-18 (×3): qty 1

## 2020-03-18 MED ORDER — APIXABAN 5 MG PO TABS
5.0000 mg | ORAL_TABLET | Freq: Two times a day (BID) | ORAL | Status: DC
  Administered 2020-03-18 (×2): 5 mg via ORAL
  Filled 2020-03-18 (×3): qty 1

## 2020-03-18 NOTE — TOC Progression Note (Signed)
Transition of Care [facility]) - Progression Note    Patient Details  Name: [name] [name] MRN: [id number] Date of Birth: 07-01-1931  Transition [facility]) CM/SW Contact  Ross Ludwig, [place] Phone Number: 03/18/2020, 5:26 PM  Clinical Narrative:      CSW spoke to patient's husband, they are still undecided regarding SNF verse going home with home health.  CSW discussed the benefits of going to SNF verse going home with home health.  Patient's husband gave CSW permission to begin bed search in [facility] in case they are agreeable to SNF placement.  If patient is not agreeable to SNF, [facility].  CSW to continue to follow patient's progress throughout discharge planning.   Expected Discharge Plan: [place] Barriers to Discharge: Continued Medical Work up  Expected Discharge Plan and Services Expected Discharge Plan: [place] In-house Referral: Clinical Social Work Discharge Planning Services: CM Consult Post Acute Care Choice: [place] arrangements for the past 2 months: [place]: PT, OT, RN [place] Agency: [facility] [employer] Date [place]: 03/17/20 Time [place]: 1243 Representative spoke with at [place]: [place] ([place]) Interventions    Readmission Risk Interventions No flowsheet data found.

## 2020-03-18 NOTE — Progress Notes (Signed)
Physical Therapy Treatment Patient Details Name: [name] [name] MRN: [id number] DOB: 18-Mar-1931 Today's Date: 03/18/2020    History of Present Illness [name] [name] is a 86 y.o. female with medical history significant for permanent atrial fibrillation on Eliquis, SSS s/p PPM, chronic diastolic CHF (EF 97-67% by TTE 09/09/2019), history of left parietal ICH/CVA, nonobstructive CAD, hypothyroidism, HTN, and HLD . Patient was recently admitted from 02/21/2020-02/25/2020 for acute lower GI bleeding secondary to internal hemorrhoids  She underwent outpatient hemorrhoidectomy (03/15/2020) and was discharged home. returned to ER for generalized weakness.    PT Comments    Pt pre-medicated prior to session and nurse applied pain patch at beginning of session. Pt needed MIN to MOD A for bed mobility, transfers, and gait. She demonstrates poor activity tolerance and had to sit down after standing for 3 mins for orthostatics due to fatigue. At this time, do not feel pt's husband could assist pt at her current level. Pt and husband already attempted to manage at home after the surgery and pt was back in the hospital the next day.  Recommend SNF and discussed with husband, pt, and daughter.    Follow Up Recommendations  SNF;Supervision/Assistance - 24 hour     Equipment Recommendations  None recommended by PT    Recommendations for Other Services       Precautions / Restrictions Precautions Precautions: Fall Restrictions Weight Bearing Restrictions: No    Mobility  Bed Mobility Overal bed mobility: Needs Assistance Bed Mobility: Supine to Sit     Supine to sit: Mod assist;HOB elevated     General bed mobility comments: Pt able to move legs, but needed assistance at trunk and for hips  Transfers Overall transfer level: Needs assistance Equipment used: Rolling walker (2 wheeled) Transfers: Sit to/from Stand Sit to Stand: Min assist;From elevated surface         General transfer comment:  Slow transition from sit to stand. Stood for orthostatics and after standing 3 mins she had to sit down due to fatigue.  Ambulation/Gait Ambulation/Gait assistance: Min assist;Mod assist Gait Distance (Feet): 15 Feet Assistive device: Rolling walker (2 wheeled) Gait Pattern/deviations: Decreased step length - right;Decreased step length - left;Trunk flexed Gait velocity: decreased   General Gait Details: slow gait pattern with MIN A and up to MOD A for turning   Stairs             Wheelchair Mobility    Modified Rankin (Stroke Patients Only)       Balance Overall balance assessment: Needs assistance   Sitting balance-Leahy Scale: Fair     Standing balance support: Bilateral upper extremity supported Standing balance-Leahy Scale: Poor                              Cognition Arousal/Alertness: Lethargic;Awake/alert (Initially lethargic, but increased alertness) Behavior During Therapy: Anxious Overall Cognitive Status: Within Functional Limits for tasks assessed                                        Exercises      General Comments General comments (skin integrity, edema, etc.): Orthostatics taken and entered by nursing. Nurse had to attend to IV in R hand      Pertinent Vitals/Pain Pain Assessment: Faces Faces Pain Scale: Hurts little more Pain Location: R thigh Pain Descriptors / Indicators: Discomfort;Sore  Pain Intervention(s): Limited activity within patient's tolerance;Monitored during session;Premedicated before session;Repositioned;Other (comment) (RN applied pain patch at beginning of session)    Home Living                      Prior Function            PT Goals (current goals can now be found in the care plan section) Progress towards PT goals: Progressing toward goals    Frequency    Min 3X/week      PT Plan Discharge plan needs to be updated    Co-evaluation              AM-PAC PT "6  Clicks" Mobility   Outcome Measure  Help needed turning from your back to your side while in a flat bed without using bedrails?: A Little Help needed moving from lying on your back to sitting on the side of a flat bed without using bedrails?: A Lot Help needed moving to and from a bed to a chair (including a wheelchair)?: A Lot Help needed standing up from a chair using your arms (e.g., wheelchair or bedside chair)?: A Little Help needed to walk in hospital room?: A Little Help needed climbing 3-5 steps with a railing? : A Lot 6 Click Score: 15    End of Session Equipment Utilized During Treatment: Gait belt Activity Tolerance: Patient tolerated treatment well Patient left: in chair;with call bell/phone within reach;with family/visitor present Nurse Communication: Mobility status PT Visit Diagnosis: Unsteadiness on feet (R26.81);Difficulty in walking, not elsewhere classified (R26.2);Pain Pain - Right/Left: Right Pain - part of body: Leg     Time: 1235-1315 (no charge for time RN attending to IV) PT Time Calculation (min) (ACUTE ONLY): 40 min  Charges:  $Gait Training: 8-22 mins $Therapeutic Activity: 8-22 mins                     Lesley Atkin L. Tamala Julian, [place] Pager [phone] 03/18/2020    Galen Manila 03/18/2020, 1:29 PM

## 2020-03-18 NOTE — Progress Notes (Signed)
PHARMACY NOTE:  ANTIMICROBIAL RENAL DOSAGE ADJUSTMENT  Current antimicrobial regimen includes a mismatch between antimicrobial dosage and estimated renal function.  As per policy approved by the Pharmacy & Therapeutics and Medical Executive Committees, the antimicrobial dosage will be adjusted accordingly.  Current antimicrobial dosage:  Bactrim 1 tab SS bid  Indication: UTI   Renal Function:  Estimated Creatinine Clearance: 42.1 mL/min (by C-G formula based on SCr of 0.92 mg/dL). []      On intermittent HD, scheduled: []      On CRRT    Antimicrobial dosage has been changed to:  Bactrim 1 DS bid   Thank you for allowing pharmacy to be a part of this patient's care.  Lynelle Doctor, [facility] 03/18/2020 10:43 AM

## 2020-03-18 NOTE — Progress Notes (Addendum)
PROGRESS NOTE    [name] [name]  ZDG:[id number] DOB: 1931-05-07 DOA: 03/16/2020 PCP: [name], MD    Chief Complaint  Patient presents with  . Post-op Problem  . Hypotension    Brief Narrative:  [name] [name] is a 86 y.o. female with medical history significant for permanent atrial fibrillation on Eliquis, SSS s/p PPM, chronic diastolic CHF (EF 95-18% by TTE 09/09/2019), history of left parietal ICH/CVA, nonobstructive CAD, hypothyroidism, HTN, and HLD who presents to the ED for evaluation of generalized weakness.  Patient was recently admitted from 02/21/2020-02/25/2020 for acute lower GI bleeding secondary to internal hemorrhoids.  During admission she was transfused 1 unit PRBC.  She underwent outpatient hemorrhoidectomy by general surgery, Dr. Leighton Ruff, yesterday (03/15/2020).  Eliquis was held 24 hours prior to the procedure.  Preoperative labs were notable for hemoglobin 10.2 and creatinine 1.10 (baseline creatinine 0.8-0.9).  She was discharged to home same day after surgery.  She says this morning she felt generally weak including knee pain at her right upper thigh/hip.  She did not have any injury or fall.  She has seen some scant rectal bleeding which is significantly less compared to prior.  She has not had any chest pain, dyspnea, cough, abdominal pain, nausea, or vomiting.  She has been feeling dehydrated and has been drinking a lot of water.  She is urinating less than she expects.  She has not had any dysuria.  She says she did resume her Eliquis this morning per instructions.  She is brought to the ED via EMS.  Per ED triage notes, she was given 800 cc normal saline by EMS on route to the ED.  ED Course:  Initial vitals showed BP 94/44, pulse 70, RR 18, temp 98.1 Fahrenheit, SPO2 94% on room air.  Labs notable for hemoglobin 7.6, WBC 13.7, platelets 215,000, creatinine 1.83, sodium 134, potassium 3.2, bicarb 21, BUN 19, serum glucose 86.  SARS-CoV-2 PCR was  obtained and pending.  Patient was ordered to receive 1 unit PRBC transfusion.  The hospitalist service was consulted to admit for further evaluation management of AKI.   Subjective:  No BM since after the surgery, report rectal pain, no active bleeding, blood pressure is improving, renal function is improving Continue to report feeling poorly , very weak,  She complained of right hip pain  Per RN and PT notes, she need 2 person assist for bed mobility, she reports this is not her baseline  Husband at bedside   Assessment & Plan:   Principal Problem:   AKI (acute kidney injury) ([place]) Active Problems:   Essential hypertension   Atrial fibrillation (HCC)   Chronic diastolic heart failure (HCC)   Hypothyroidism   Symptomatic anemia   Hypokalemia  Symptomatic anemia -With recent history of rectal bleeding status post hemorrhoidectomy on September 1 -Hemoglobin 7.6 on presentation, she received 1 unit blood, repeat hemoglobin 9.2 --Eliquis held on admission , she was started on heparin drip due to high risk of stroke , she did not have any sign of GI bleed while on heparin drip , DC heparin drip ,resume Eliquis today    Constipation x5days With rectal pain, status post hemorrhoidectomy on September 1 Case discussed with general surgery on-call Dr. Ninfa Linden recommend topical lidocaine, bowel regimen, sitz bath. Avoid suppository.  AKI on CKD 2: -Possible hemodynamically related and UTI -Renal US: Moderate bilateral renal cortical atrophy. No evidence of hydronephrosis. -Urine is dark and cloudy, urine culture positive for > 100,000 gram  negative rods,  -She is allergic to penicillin and Cipro, started on Bactrim empirically , waiting for final urine culture result -Continue hold losartan -Creatinine improving  Hypokalemia Replaced and normalized  Hypomagnesemia; replace iv mag  Persistent atrial fibrillation with h/o CVA SSS s/p PPM: CHA2DS2-VASc score 6.  Remains in  A. fib with controlled rate. Labetalol and Cardizem held on admission due to hypotension She is currently normal tensive without BP meds ,  start labetalol at lower dose with holding parameter, continue hold Cardizem -She is on heparin drip yesterday, transitioned to Eliquis today since no active bleeding.  Chronic diastolic CHF: EF 02-63% by TTE 09/09/2019.  Appears volume depleted on admission.  she received IV fluid hydration on admission.   has bilateral lower extremity edema on yesterday, edema resolved today   I think she is intravascularly dry initially , she received LR bolus 1 L on presentation Will hold off IV hydration, encourage oral intake, elevate legs It appears that Lasix and spironolactone was discontinued on August 13 on her last discharge ,monitor strict I/O's and daily weights.  Hypertension: she initially presented with hypotension, blood pressure 83/48 11 PM on 9/2,  has been borderline hypotension for a few hours from midnight to about 3:00 on 9/3, bp improved after holding bp meds and ivf bolus  Home BP meds diltiazem, labetalol, losartan on hold due to hypotension initially on presentation she is normotensive today, start low-dose labetalol with holding parameter today, continue hold Cardizem and losartan  Hypothyroidism: Continue Synthroid.  Hyperlipidemia: Not currently on statin therapy due to previous rise in LFTs.  Right hip pain Ordered right hip x-ray  Failure to thrive She appears very weak to me, will repeat PT eval in the morning  DVT prophylaxis: SCDs Start: 03/16/20 2133   Code Status: DNR Family Communication: Husband at bedside Disposition:   Status is: Inpatient  Dispo: The patient is from: Home              Anticipated d/c is to: To be determined, she  appears very weak              Anticipated d/c date is: Likely September 5, awaiting urine culture, PT reeval               Consultants:   None  Procedures:    None  Antimicrobials:   Bactrim     Objective: Vitals:   03/17/20 2242 03/18/20 0200 03/18/20 0500 03/18/20 0629  BP: 139/72 132/74  (!) 146/73  Pulse: 82 90  99  Resp: (!) 22 20  18   Temp: 99.4 F (37.4 C) 99.8 F (37.7 C)  98.7 F (37.1 C)  TempSrc: Oral Oral  Oral  SpO2: 97% 97%  97%  Weight:   70 kg   Height:        Intake/Output Summary (Last 24 hours) at 03/18/2020 1214 Last data filed at 03/18/2020 7858 Gross per 24 hour  Intake 42.27 ml  Output 450 ml  Net -407.73 ml   Filed Weights   03/17/20 1718 03/18/20 0500  Weight: 66.6 kg 70 kg    Examination:  General exam: Very weak , frail , chronically ill-appearing  Respiratory system: Clear to auscultation. Respiratory effort normal. Cardiovascular system: IRRR, pedal edema has improved Gastrointestinal system: Abdomen is nondistended, soft and nontender. No organomegaly or masses felt. Normal bowel sounds heard. Central nervous system: Alert and oriented. No focal neurological deficits. Extremities: Generalized weakness Skin: Scattered ecchymosis bilateral lower extremities Psychiatry: Judgement  and insight appear normal. Mood & affect appropriate.     Data Reviewed: I have personally reviewed following labs and imaging studies  CBC: Recent Labs  Lab 03/15/20 1017 03/16/20 1706 03/17/20 0623 03/18/20 0527  WBC  --  13.7* 11.9* 11.4*  NEUTROABS  --  11.3*  --   --   HGB 10.2* 7.6* 9.2* 9.2*  HCT 30.0* 24.4* 28.8* 29.3*  MCV  --  95.7 94.1 93.6  PLT  --  215 204 428    Basic Metabolic Panel: Recent Labs  Lab 03/15/20 1017 03/16/20 1706 03/17/20 0623 03/18/20 0527  NA 142 134* 136 140  K 3.5 3.2* 4.0 3.9  CL 107 105 108 111  CO2  --  21* 21* 21*  GLUCOSE 96 86 92 70  BUN 11 19 19 15   CREATININE 1.10* 1.83* 1.29* 0.92  CALCIUM  --  8.8* 8.8* 9.0  MG  --  1.8  --  1.7    GFR: Estimated Creatinine Clearance: 42.1 mL/min (by C-G formula based on SCr of 0.92 mg/dL).  Liver Function  Tests: No results for input(s): AST, ALT, ALKPHOS, BILITOT, PROT, ALBUMIN in the last 168 hours.  CBG: No results for input(s): GLUCAP in the last 168 hours.   Recent Results (from the past 240 hour(s))  SARS CORONAVIRUS 2 (TAT 6-24 HRS) Nasopharyngeal Nasopharyngeal Swab     Status: None   Collection Time: 03/13/20  2:57 PM   Specimen: Nasopharyngeal Swab  Result Value Ref Range Status   SARS Coronavirus 2 NEGATIVE NEGATIVE Final    Comment: (NOTE) SARS-CoV-2 target nucleic acids are NOT DETECTED.  The SARS-CoV-2 RNA is generally detectable in upper and lower respiratory specimens during the acute phase of infection. Negative results do not preclude SARS-CoV-2 infection, do not rule out co-infections with other pathogens, and should not be used as the sole basis for treatment or other patient management decisions. Negative results must be combined with clinical observations, patient history, and epidemiological information. The expected result is Negative.  Fact Sheet for Patients: SugarRoll.be  Fact Sheet for Healthcare Providers: https://www.woods-mathews.com/  This test is not yet approved or cleared by the [place] FDA and  has been authorized for detection and/or diagnosis of SARS-CoV-2 by FDA under an Emergency Use Authorization (EUA). This EUA will remain  in effect (meaning this test can be used) for the duration of the COVID-19 declaration under Se ction 564(b)(1) of the Act, 21 U.S.C. section 360bbb-3(b)(1), unless the authorization is terminated or revoked sooner.  Performed at [facility], [place] [address], [place], [place] [address]   SARS Coronavirus 2 by RT PCR (hospital order, performed in [facility] hospital lab) Nasopharyngeal Nasopharyngeal Swab     Status: None   Collection Time: 03/16/20  8:42 PM   Specimen: Nasopharyngeal Swab  Result Value Ref Range Status   SARS Coronavirus 2 NEGATIVE NEGATIVE  Final    Comment: (NOTE) SARS-CoV-2 target nucleic acids are NOT DETECTED.  The SARS-CoV-2 RNA is generally detectable in upper and lower respiratory specimens during the acute phase of infection. The lowest concentration of SARS-CoV-2 viral copies this assay can detect is 250 copies / mL. A negative result does not preclude SARS-CoV-2 infection and should not be used as the sole basis for treatment or other patient management decisions.  A negative result may occur with improper specimen collection / handling, submission of specimen other than nasopharyngeal swab, presence of viral mutation(s) within the areas targeted by this assay,  and inadequate number of viral copies (<250 copies / mL). A negative result must be combined with clinical observations, patient history, and epidemiological information.  Fact Sheet for Patients:   StrictlyIdeas.no  Fact Sheet for Healthcare Providers: BankingDealers.co.za  This test is not yet approved or  cleared by the [place] FDA and has been authorized for detection and/or diagnosis of SARS-CoV-2 by FDA under an Emergency Use Authorization (EUA).  This EUA will remain in effect (meaning this test can be used) for the duration of the COVID-19 declaration under Section 564(b)(1) of the Act, 21 U.S.C. section 360bbb-3(b)(1), unless the authorization is terminated or revoked sooner.  Performed at [facility], [place] [address], [place], [place] [address]   Culture, Urine     Status: Abnormal (Preliminary result)   Collection Time: 03/17/20 11:25 AM   Specimen: Urine, Clean Catch  Result Value Ref Range Status   Specimen Description   Final    URINE, CLEAN CATCH Performed at [facility], [place] [address] [place], [place] [address]    Special Requests   Final    NONE Performed at [facility], [place] [address], [place], [place] [address]     Culture >=100,000 COLONIES/mL GRAM NEGATIVE RODS (A)  Final   Report Status PENDING  Incomplete         Radiology Studies: US RENAL  Result Date: 03/17/2020 CLINICAL DATA:  86 year old female with acute kidney injury. EXAM: RENAL / URINARY TRACT ULTRASOUND COMPLETE COMPARISON:  04/22/2016 ultrasound FINDINGS: Right Kidney: Renal measurements: 10.8 x 5.3 x 5.4 cm = volume: 160 mL. Moderate cortical atrophy noted. A 1.9 cm UPPER pole cyst is present. No solid mass or hydronephrosis identified. Echogenicity is within normal limits. Left Kidney: Renal measurements: 11.7 x 6.1 x 5.5 cm = volume: 202 mL. Moderate cortical atrophy noted. Echogenicity within normal limits. No mass or hydronephrosis visualized. Bladder: Appears normal for degree of bladder distention. Other: None. IMPRESSION: 1. Moderate bilateral renal cortical atrophy. 2. No evidence of hydronephrosis. Electronically Signed   By: Margarette Canada M.D.   On: 03/17/2020 07:59   DG HIP UNILAT WITH PELVIS 2-3 VIEWS RIGHT  Result Date: 03/18/2020 CLINICAL DATA:  Acute right hip pain after surgery. EXAM: DG HIP (WITH OR WITHOUT PELVIS) 2-3V RIGHT COMPARISON:  None. FINDINGS: There is no evidence of hip fracture or dislocation. No significant joint space narrowing is noted. Mild osteophyte formation is noted involving the acetabulum. IMPRESSION: Mild degenerative changes are noted. No acute abnormality is noted. Electronically Signed   By: Marijo Conception M.D.   On: 03/18/2020 11:34        Scheduled Meds: . labetalol  100 mg Oral BID  . levothyroxine  25 mcg Oral Q0600  . lidocaine  1 patch Transdermal Q24H  . magnesium hydroxide  30 mL Oral BID  . senna-docusate  1 tablet Oral BID  . sodium chloride flush  3 mL Intravenous Q12H  . sulfamethoxazole-trimethoprim  1 tablet Oral Once  . sulfamethoxazole-trimethoprim  1 tablet Oral Q12H   Continuous Infusions: . sodium chloride    . heparin 900 Units/hr (03/17/20 1242)  . magnesium  sulfate bolus IVPB 2 g (03/18/20 1129)     LOS: 1 day   Time spent: 36mns Greater than 50% of this time was spent in counseling, explanation of diagnosis, planning of further management, and coordination of care.  I have personally reviewed and interpreted on  03/18/2020 daily labs, tele strips, imagings as  discussed above under date review session and assessment and plans.  I reviewed all nursing notes, pharmacy notes, consultant notes,  vitals, pertinent old records  I have discussed plan of care as described above with RN , patient and family on 03/18/2020  Voice Recognition /Dragon dictation system was used to create this note, attempts have been made to correct errors. Please contact the author with questions and/or clarifications.   Florencia Reasons, MD PhD FACP [facility]  Available via Epic secure chat 7am-7pm for nonurgent issues Please page for urgent issues To page the attending provider between 7A-7P or the covering provider during after hours 7P-7A, please log into the web site [email or web address] and access using universal [place] password for that web site. If you do not have the password, please call the hospital operator.    03/18/2020, 12:14 PM

## 2020-03-18 NOTE — Progress Notes (Signed)
ANTICOAGULATION CONSULT NOTE - Follow Up Consult  Pharmacy Consult for heparin Indication: hx atrial fibrillation (home Eliquis on hold)  Allergies  Allergen Reactions  . Penicillins Anaphylaxis    Has patient had a PCN reaction causing immediate rash, facial/tongue/throat swelling, SOB or lightheadedness with hypotension: Yes Has patient had a PCN reaction causing severe rash involving mucus membranes or skin necrosis: No Has patient had a PCN reaction that required hospitalization No Has patient had a PCN reaction occurring within the last 10 years: No If all of the above answers are "NO", then may proceed with Cephalosporin use.   . Statins Other (See Comments)    REACTION: elevated LFT's  . Tylenol [Acetaminophen] Other (See Comments)    If taken with Vytorin at risk for liver damage  . Adhesive [Tape] Other (See Comments)    Irritates skin  . Amiodarone Other (See Comments)    unk  . Quinolones Other (See Comments)    UNk  . Tape Other (See Comments)    Redness, Please use "paper" tape only.  . Codeine Other (See Comments)    feel funny, head is fuzzy    Patient Measurements: Height: 5' 4"  (162.6 cm) Weight: 70 kg (154 lb 5.2 oz) IBW/kg (Calculated) : 54.7 Heparin Dosing Weight: 70 kg  Vital Signs: Temp: 98.7 F (37.1 C) (09/04 0629) Temp Source: Oral (09/04 0629) BP: 146/73 (09/04 0629) Pulse Rate: 99 (09/04 0629)  Labs: Recent Labs    03/16/20 1706 03/16/20 1706 03/17/20 0623 03/17/20 1243 03/17/20 2024 03/18/20 0527  HGB 7.6*   < > 9.2*  --   --  9.2*  HCT 24.4*  --  28.8*  --   --  29.3*  PLT 215  --  204  --   --  210  APTT  --   --   --   --  99* 86*  HEPARINUNFRC  --   --   --  1.56*  --  0.96*  CREATININE 1.83*  --  1.29*  --   --  0.92   < > = values in this interval not displayed.    Estimated Creatinine Clearance: 42.1 mL/min (by C-G formula based on SCr of 0.92 mg/dL).   Medications:  - on Eliquis 5 mg bid PTA (last dose taken on 9/2  at 0700)  Assessment: Patient is an 86 y.o F s/p outpatient hemorrhoidectomy on 9/1, presented to the ED on 9/2 with generalized weakness.  She was found to have symptomatic anemia. Anticoagulation was transitioned to heparin drip on 9/3.  - 9/3: 1 unit PRBC  Today, 03/18/2020: - aPTT is therapeutic at 86 secs; Heparin level is elevated at 0.96 but this is likely from effect of residual Eliquis.  Will adjust heparin rate using aPTT at this time. Once they correlate, will monitor using HL only - scr improves to 092 - cbc stable - no bleeding documented   Goal of Therapy:  Heparin level 0.3-0.7 units/ml aPTT 66-102 seconds Monitor platelets by anticoagulation protocol: Yes   Plan:  - continue heparin drip at 900 units/hr - daily heparin level, cbc - aPTT with AM labs on 9/5 - monitor for s/sx bleeding  Prince Olivier P 03/18/2020,10:32 AM

## 2020-03-18 NOTE — NC FL2 (Signed)
[place] LEVEL OF CARE SCREENING TOOL     IDENTIFICATION  Patient Name: [name] [name] Birthdate: 03/31/1931 Sex: female Admission Date (Current Location): 03/16/2020  [facility] [place] and [place] Number:  Herbalist and Address:  [facility],  [place], [place]      Provider Number: [id number]  Attending Physician Name and Address:  Florencia Reasons, MD  Relative Name and Phone Number:  Mahdiya, [place] [phone]  [phone]    Current Level of Care: Hospital Recommended Level of Care: [place] Prior Approval Number:    Date Approved/Denied:   PASRR Number: [id number]  Discharge Plan: SNF    Current Diagnoses: Patient Active Problem List   Diagnosis Date Noted  . AKI (acute kidney injury) ([place]) 03/16/2020  . Hypokalemia 03/16/2020  . Lower GI bleed   . Acute blood loss anemia   . Acute lower GI bleeding 02/21/2020  . Symptomatic anemia 02/21/2020  . Migraine equivalent 01/06/2017  . Calculus of kidney   . Chronic diastolic CHF (congestive heart failure) ([place])   . AAA (abdominal aortic aneurysm) without rupture ([place])   . PMR (polymyalgia rheumatica) (HCC)   . Hypothyroidism   . Calculus of ureter   . CAD in native artery   . Other specified hypothyroidism   . Elevated LFTs 04/22/2016  . Hydronephrosis 04/22/2016  . Renal lithiasis 04/22/2016  . UTI (urinary tract infection) 04/22/2016  . Acute kidney injury ([place]) 04/22/2016  . Atrial flutter ([place])   . Nontraumatic cortical hemorrhage of left cerebral hemisphere ([place]) 05/03/2015  . Chronic anticoagulation 05/03/2015  . Cardiac pacemaker in situ 05/03/2015  . Encounter for therapeutic drug monitoring 10/04/2013  . Dyspnea on exertion 07/26/2013  . Chest pain 02/04/2013  . Orthostatic lightheadedness 06/25/2012  . Pacemaker-Medtronic 11/12/2011  . Ascending aorta enlargement ([place]) 08/12/2011  . Chronic diastolic heart failure ([place]) 12/27/2010   . Long term (current) use of anticoagulants 10/15/2010  . PREMATURE VENTRICULAR CONTRACTIONS 10/17/2009  . post termination pauses/.sinus bradycardia 11/17/2008  . Essential hypertension 04/28/2008  . Atrial fibrillation ([place]) 04/28/2008    Orientation RESPIRATION BLADDER Height & Weight     Self, Time, Place, Situation  Normal Incontinent Weight: 154 lb 5.2 oz (70 kg) Height:  5' 4"  (162.6 cm)  BEHAVIORAL SYMPTOMS/MOOD NEUROLOGICAL BOWEL NUTRITION STATUS      Continent Diet (Regular diet)  AMBULATORY STATUS COMMUNICATION OF NEEDS Skin   Limited Assist Verbally Surgical wounds                       Personal Care Assistance Level of Assistance  Bathing, Dressing, Feeding Bathing Assistance: Limited assistance Feeding assistance: Independent Dressing Assistance: Limited assistance     Functional Limitations Info  Sight, Hearing, Speech Sight Info: Adequate Hearing Info: Adequate Speech Info: Adequate    SPECIAL CARE FACTORS FREQUENCY  OT (By licensed OT), PT (By licensed PT)     PT Frequency: Minimum 5x a week OT Frequency: Minimum 5x a week            Contractures Contractures Info: Not present    Additional Factors Info  Code Status, Allergies Code Status Info: DNR Allergies Info: Penicillins Statins Tylenol Adhesive tape Amiodarone Quinolones Tape Codeine           Current Medications (03/18/2020):  This is the current hospital active medication list Current Facility-Administered Medications  Medication Dose Route Frequency Provider Last Rate Last Admin  . 0.9 %  sodium  chloride infusion  10 mL/hr Intravenous Once Robinson, [place] N, PA-C      . acetaminophen (TYLENOL) tablet 650 mg  650 mg Oral Q6H PRN Lenore Cordia, MD   650 mg at 03/18/20 1106   Or  . acetaminophen (TYLENOL) suppository 650 mg  650 mg Rectal Q6H PRN Lenore Cordia, MD      . apixaban (ELIQUIS) tablet 5 mg  5 mg Oral BID Angela Adam, RPH   5 mg at 03/18/20 1400  .  labetalol (NORMODYNE) tablet 100 mg  100 mg Oral BID Florencia Reasons, MD      . levothyroxine (SYNTHROID) tablet 25 mcg  25 mcg Oral Q0600 Lenore Cordia, MD   25 mcg at 03/18/20 0527  . lidocaine (LIDODERM) 5 % 1 patch  1 patch Transdermal Q24H Florencia Reasons, MD   1 patch at 03/18/20 1237  . lidocaine (LMX) 4 % cream   Topical BID PRN Florencia Reasons, MD      . magnesium hydroxide (MILK OF MAGNESIA) suspension 30 mL  30 mL Oral BID Florencia Reasons, MD   30 mL at 03/18/20 1236  . ondansetron (ZOFRAN) tablet 4 mg  4 mg Oral Q6H PRN Lenore Cordia, MD       Or  . ondansetron (ZOFRAN) injection 4 mg  4 mg Intravenous Q6H PRN Lenore Cordia, MD      . senna-docusate (Senokot-S) tablet 1 tablet  1 tablet Oral BID Florencia Reasons, MD   1 tablet at 03/18/20 1107  . sodium chloride flush (NS) 0.9 % injection 3 mL  3 mL Intravenous Q12H Zada Finders R, MD   3 mL at 03/17/20 2026  . sulfamethoxazole-trimethoprim (BACTRIM DS) 800-160 MG per tablet 1 tablet  1 tablet Oral Once Florencia Reasons, MD      . sulfamethoxazole-trimethoprim (BACTRIM DS) 800-160 MG per tablet 1 tablet  1 tablet Oral Q12H Florencia Reasons, MD   1 tablet at 03/18/20 1106     Discharge Medications: Please see discharge summary for a list of discharge medications.  Relevant Imaging Results:  Relevant Lab Results:   Additional Information SSN [id number]  Ross Ludwig, LCSW

## 2020-03-18 NOTE — Progress Notes (Signed)
ANTICOAGULATION CONSULT NOTE - Initial Consult  Pharmacy Consult for apixaban Indication: atrial fibrillation  Allergies  Allergen Reactions  . Penicillins Anaphylaxis    Has patient had a PCN reaction causing immediate rash, facial/tongue/throat swelling, SOB or lightheadedness with hypotension: Yes Has patient had a PCN reaction causing severe rash involving mucus membranes or skin necrosis: No Has patient had a PCN reaction that required hospitalization No Has patient had a PCN reaction occurring within the last 10 years: No If all of the above answers are "NO", then may proceed with Cephalosporin use.   . Statins Other (See Comments)    REACTION: elevated LFT's  . Tylenol [Acetaminophen] Other (See Comments)    If taken with Vytorin at risk for liver damage  . Adhesive [Tape] Other (See Comments)    Irritates skin  . Amiodarone Other (See Comments)    unk  . Quinolones Other (See Comments)    UNk  . Tape Other (See Comments)    Redness, Please use "paper" tape only.  . Codeine Other (See Comments)    feel funny, head is fuzzy    Patient Measurements: Height: 5' 4"  (162.6 cm) Weight: 70 kg (154 lb 5.2 oz) IBW/kg (Calculated) : 54.7   Vital Signs: Temp: 98.7 F (37.1 C) (09/04 0629) Temp Source: Oral (09/04 0629) BP: 146/73 (09/04 0629) Pulse Rate: 99 (09/04 0629)  Labs: Recent Labs    03/16/20 1706 03/16/20 1706 03/17/20 0623 03/17/20 1243 03/17/20 2024 03/18/20 0527  HGB 7.6*   < > 9.2*  --   --  9.2*  HCT 24.4*  --  28.8*  --   --  29.3*  PLT 215  --  204  --   --  210  APTT  --   --   --   --  99* 86*  HEPARINUNFRC  --   --   --  1.56*  --  0.96*  CREATININE 1.83*  --  1.29*  --   --  0.92   < > = values in this interval not displayed.    Estimated Creatinine Clearance: 42.1 mL/min (by C-G formula based on SCr of 0.92 mg/dL).   Medical History: Past Medical History:  Diagnosis Date  . Anemia due to chronic blood loss 12/2019   rectal bleeding   . Bilateral lower extremity edema   . CAD (coronary artery disease) cardiologist-- dr Caryl Comes   a. LHC 10/2017 30% RCA otherwise OK.  (nonobstructive cad)  . Chronic diastolic CHF (congestive heart failure) ([place])    followed by dr Caryl Comes  . Chronic venous insufficiency   . Hemorrhoids   . History of adenomatous polyp of colon   . History of hemorrhagic cerebrovascular accident (CVA) without residual deficits 03/25/2015   (previous seen by neuologist--- dr Erlinda Hong   ICH left posterior parietal in setting  HTN/ and pt taking coumadin;   per pt no residual  . History of kidney stones   . History of recurrent UTIs   . HLD (hyperlipidemia)   . HTN (hypertension)    followed by dr Caryl Comes and pcp  . Hypothyroidism    followed by pcp  . NASH (nonalcoholic steatohepatitis)    hx medication induced hepatitis (vytorin per pt)  . OA (osteoarthritis)   . Pacemaker-[employer]tr[employer] 07/29/2011  implanted   followed by dr Caryl Comes  . Permanent atrial fibrillation [facility])    cardiology--- dr Caryl Comes--- hx several DCCVs  . PMR (polymyalgia rheumatica) ([place])    03-14-2020  per pt has  not had any issues in several years  . Rectal bleeding   . sinus node dysfunction//post termination pauses   . SSS (sick sinus syndrome) ([place])    s/p  PPM 07-29-2011  . Thoracic aortic aneurysm (TAA) ([place])    a. followed by Dr. Servando Snare.    Assessment: Patient is an 86 y.o F s/p outpatient hemorrhoidectomy on 9/1, presented to the ED on 9/2 with generalized weakness.  She was found to have symptomatic anemia. Anticoagulation was transitioned to heparin drip on 9/3. Pharmacy consulted to resume home apixaban.   Plan:  Stop heparin drip and labs start apixaban 83m po twice daily  EDolly RiasRPh 03/18/2020, 12:38 PM

## 2020-03-18 NOTE — Progress Notes (Signed)
Patient complains of general acheyness that seems like bone pain. She had Dexa scans done several times in the past. Maybe OP she could have vit D checked/ take supplements ?

## 2020-03-18 NOTE — Progress Notes (Signed)
Patient has ambulated today in room with PT and RN, tolerated very well.  Patient up to chair for the afternoon, washed up in chair and back to bed this evening.  Tolerated well.  Tylenol administered x2, once for R upper leg pain, and again for the same pain as well as rectal pain.  PRN lidocaine cream applied perirectally as directed but patient states no relief.  Dr. Erlinda Hong aware, sitz bath ordered for patient.  Very small amount of bleeding noticed on pad from rectal area.  Patient remains anxious, but reassured, emotional support provided from RN. Continues to have little appetite and poor PO intake.  Will continue to monitor.

## 2020-03-19 DIAGNOSIS — I495 Sick sinus syndrome: Secondary | ICD-10-CM | POA: Diagnosis present

## 2020-03-19 DIAGNOSIS — I4821 Permanent atrial fibrillation: Secondary | ICD-10-CM | POA: Diagnosis present

## 2020-03-19 DIAGNOSIS — E876 Hypokalemia: Secondary | ICD-10-CM | POA: Diagnosis present

## 2020-03-19 DIAGNOSIS — E860 Dehydration: Secondary | ICD-10-CM | POA: Diagnosis present

## 2020-03-19 DIAGNOSIS — D649 Anemia, unspecified: Secondary | ICD-10-CM | POA: Diagnosis not present

## 2020-03-19 DIAGNOSIS — I480 Paroxysmal atrial fibrillation: Secondary | ICD-10-CM | POA: Diagnosis not present

## 2020-03-19 DIAGNOSIS — I130 Hypertensive heart and chronic kidney disease with heart failure and stage 1 through stage 4 chronic kidney disease, or unspecified chronic kidney disease: Secondary | ICD-10-CM | POA: Diagnosis present

## 2020-03-19 DIAGNOSIS — K921 Melena: Secondary | ICD-10-CM | POA: Diagnosis present

## 2020-03-19 DIAGNOSIS — N179 Acute kidney failure, unspecified: Secondary | ICD-10-CM | POA: Diagnosis present

## 2020-03-19 DIAGNOSIS — K625 Hemorrhage of anus and rectum: Secondary | ICD-10-CM

## 2020-03-19 DIAGNOSIS — D62 Acute posthemorrhagic anemia: Secondary | ICD-10-CM | POA: Diagnosis present

## 2020-03-19 DIAGNOSIS — E785 Hyperlipidemia, unspecified: Secondary | ICD-10-CM | POA: Diagnosis present

## 2020-03-19 DIAGNOSIS — K7581 Nonalcoholic steatohepatitis (NASH): Secondary | ICD-10-CM | POA: Diagnosis present

## 2020-03-19 DIAGNOSIS — Z880 Allergy status to penicillin: Secondary | ICD-10-CM | POA: Diagnosis not present

## 2020-03-19 DIAGNOSIS — E861 Hypovolemia: Secondary | ICD-10-CM | POA: Diagnosis present

## 2020-03-19 DIAGNOSIS — E162 Hypoglycemia, unspecified: Secondary | ICD-10-CM | POA: Diagnosis present

## 2020-03-19 DIAGNOSIS — Z66 Do not resuscitate: Secondary | ICD-10-CM | POA: Diagnosis present

## 2020-03-19 DIAGNOSIS — Z881 Allergy status to other antibiotic agents status: Secondary | ICD-10-CM | POA: Diagnosis not present

## 2020-03-19 DIAGNOSIS — I959 Hypotension, unspecified: Secondary | ICD-10-CM | POA: Diagnosis present

## 2020-03-19 DIAGNOSIS — B9620 Unspecified Escherichia coli [E. coli] as the cause of diseases classified elsewhere: Secondary | ICD-10-CM | POA: Diagnosis present

## 2020-03-19 DIAGNOSIS — K922 Gastrointestinal hemorrhage, unspecified: Secondary | ICD-10-CM | POA: Diagnosis not present

## 2020-03-19 DIAGNOSIS — Z950 Presence of cardiac pacemaker: Secondary | ICD-10-CM | POA: Diagnosis not present

## 2020-03-19 DIAGNOSIS — N1831 Chronic kidney disease, stage 3a: Secondary | ICD-10-CM | POA: Diagnosis present

## 2020-03-19 DIAGNOSIS — E039 Hypothyroidism, unspecified: Secondary | ICD-10-CM | POA: Diagnosis present

## 2020-03-19 DIAGNOSIS — Z20822 Contact with and (suspected) exposure to covid-19: Secondary | ICD-10-CM | POA: Diagnosis present

## 2020-03-19 DIAGNOSIS — I4891 Unspecified atrial fibrillation: Secondary | ICD-10-CM | POA: Diagnosis not present

## 2020-03-19 DIAGNOSIS — I5032 Chronic diastolic (congestive) heart failure: Secondary | ICD-10-CM | POA: Diagnosis present

## 2020-03-19 DIAGNOSIS — N390 Urinary tract infection, site not specified: Secondary | ICD-10-CM | POA: Diagnosis present

## 2020-03-19 LAB — BASIC METABOLIC PANEL
Anion gap: 10 (ref 5–15)
BUN: 14 mg/dL (ref 8–23)
CO2: 20 mmol/L — ABNORMAL LOW (ref 22–32)
Calcium: 8.8 mg/dL — ABNORMAL LOW (ref 8.9–10.3)
Chloride: 108 mmol/L (ref 98–111)
Creatinine, Ser: 0.87 mg/dL (ref 0.44–1.00)
GFR calc Af Amer: >60 mL/min (ref >60)
GFR calc non Af Amer: >60 mL/min (ref >60)
Glucose, Bld: 66 mg/dL — ABNORMAL LOW (ref 70–99)
Potassium: 4 mmol/L (ref 3.5–5.1)
Sodium: 138 mmol/L (ref 135–145)

## 2020-03-19 LAB — CBC WITH DIFFERENTIAL/PLATELET
Abs Immature Granulocytes: 0.05 10*3/uL (ref 0.00–0.07)
Basophils Absolute: 0 10*3/uL (ref 0.0–0.1)
Basophils Relative: 0 %
Eosinophils Absolute: 0.1 10*3/uL (ref 0.0–0.5)
Eosinophils Relative: 1 %
HCT: 29.3 % — ABNORMAL LOW (ref 36.0–46.0)
Hemoglobin: 9.3 g/dL — ABNORMAL LOW (ref 12.0–15.0)
Immature Granulocytes: 1 %
Lymphocytes Relative: 8 %
Lymphs Abs: 0.8 10*3/uL (ref 0.7–4.0)
MCH: 29.7 pg (ref 26.0–34.0)
MCHC: 31.7 g/dL (ref 30.0–36.0)
MCV: 93.6 fL (ref 80.0–100.0)
Monocytes Absolute: 1.1 10*3/uL — ABNORMAL HIGH (ref 0.1–1.0)
Monocytes Relative: 11 %
Neutro Abs: 7.3 10*3/uL (ref 1.7–7.7)
Neutrophils Relative %: 79 %
Platelets: 233 10*3/uL (ref 150–400)
RBC: 3.13 MIL/uL — ABNORMAL LOW (ref 3.87–5.11)
RDW: 17.2 % — ABNORMAL HIGH (ref 11.5–15.5)
WBC: 9.3 10*3/uL (ref 4.0–10.5)
nRBC: 0 % (ref 0.0–0.2)

## 2020-03-19 LAB — HEMOGLOBIN AND HEMATOCRIT, BLOOD
HCT: 29.8 % — ABNORMAL LOW (ref 36.0–46.0)
Hemoglobin: 9.4 g/dL — ABNORMAL LOW (ref 12.0–15.0)

## 2020-03-19 LAB — URINE CULTURE: Culture: >=100000 — AB

## 2020-03-19 LAB — TSH: TSH: 1.587 u[IU]/mL (ref 0.350–4.500)

## 2020-03-19 LAB — MAGNESIUM: Magnesium: 2.2 mg/dL (ref 1.7–2.4)

## 2020-03-19 MED ORDER — SENNOSIDES-DOCUSATE SODIUM 8.6-50 MG PO TABS: 1.0000 | ORAL_TABLET | Freq: Every day | ORAL | Status: DC

## 2020-03-19 MED ORDER — FUROSEMIDE 40 MG PO TABS
40.0000 mg | ORAL_TABLET | Freq: Once | ORAL | Status: AC
  Administered 2020-03-19: 40 mg via ORAL
  Filled 2020-03-19: qty 1

## 2020-03-19 MED ORDER — LABETALOL HCL 200 MG PO TABS
200.0000 mg | ORAL_TABLET | Freq: Two times a day (BID) | ORAL | Status: DC
  Administered 2020-03-19: 200 mg via ORAL
  Filled 2020-03-19 (×2): qty 1

## 2020-03-19 MED ORDER — ENSURE ENLIVE PO LIQD
237.0000 mL | Freq: Two times a day (BID) | ORAL | Status: DC
  Administered 2020-03-19 – 2020-03-24 (×10): 237 mL via ORAL

## 2020-03-19 NOTE — Progress Notes (Addendum)
PROGRESS NOTE    [name] [name]  DDU:[id number] DOB: 10/12/1930 DOA: 03/16/2020 PCP: [name], MD    Chief Complaint  Patient presents with  . Post-op Problem  . Hypotension    Brief Narrative:  [name] [name] is a 86 y.o. female with medical history significant for permanent atrial fibrillation on Eliquis, SSS s/p PPM, chronic diastolic CHF (EF 23-76% by TTE 09/09/2019), history of left parietal ICH/CVA, nonobstructive CAD, hypothyroidism, HTN, and HLD who presents to the ED for evaluation of generalized weakness.  Patient was recently admitted from 02/21/2020-02/25/2020 for acute lower GI bleeding secondary to internal hemorrhoids.  During admission she was transfused 1 unit PRBC.  She underwent outpatient hemorrhoidectomy by general surgery, Dr. Leighton Ruff, yesterday (03/15/2020).  Eliquis was held 24 hours prior to the procedure.  Preoperative labs were notable for hemoglobin 10.2 and creatinine 1.10 (baseline creatinine 0.8-0.9).  She was discharged to home same day after surgery.  She says this morning she felt generally weak including knee pain at her right upper thigh/hip.  She did not have any injury or fall.  She has seen some scant rectal bleeding which is significantly less compared to prior.  She has not had any chest pain, dyspnea, cough, abdominal pain, nausea, or vomiting.  She has been feeling dehydrated and has been drinking a lot of water.  She is urinating less than she expects.  She has not had any dysuria.  She says she did resume her Eliquis this morning per instructions.  She is brought to the ED via EMS.  Per ED triage notes, she was given 800 cc normal saline by EMS on route to the ED.  ED Course:  Initial vitals showed BP 94/44, pulse 70, RR 18, temp 98.1 Fahrenheit, SPO2 94% on room air.  Labs notable for hemoglobin 7.6, WBC 13.7, platelets 215,000, creatinine 1.83, sodium 134, potassium 3.2, bicarb 21, BUN 19, serum glucose 86.  SARS-CoV-2 PCR was  obtained and pending.  Patient was ordered to receive 1 unit PRBC transfusion.  The hospitalist service was consulted to admit for further evaluation management of AKI.   Subjective:  BMx1 last night with 100cc bright red blood in stool,   She reports topical lidocaine cream helped rectal pain She has poor appetite, she has hypoglycemia this am less right hip/leg  pain    Assessment & Plan:   Principal Problem:   AKI (acute kidney injury) ([place]) Active Problems:   Essential hypertension   Atrial fibrillation (HCC)   Chronic diastolic heart failure (HCC)   Hypothyroidism   Symptomatic anemia   Hypokalemia  Symptomatic anemia -With recent history of rectal bleeding status post hemorrhoidectomy on September 1 -Hemoglobin 7.6 on presentation, she received 1 unit blood, repeat hemoglobin 9.2 --Eliquis held on admission , she was started on heparin drip due to high risk of stroke , she did not have any sign of GI bleed while on heparin drip , heparin drip d/ced ,resumed Eliquis  on 9/4 -has another episode of BRBPR with 100cc blood last night while having bm, then had 4 dark stool in the morning , hgb remains stable so far, monitor hgb bid, transfuse if hgb less than 8 -she might need EGD  She continues to have dark stool and hgb drops, discussed with family   Constipation With rectal pain, status post hemorrhoidectomy on September 1, reports no bm since surgery Case discussed with general surgery on-call Dr. Ninfa Linden on 9/4 who recommend topical lidocaine, bowel regimen, sitz  bath. Avoid suppository. BMx1 9/4-9/5night with 100cc bright red blood in stool, general surgery formally consulted on 9/5 Addendum: Case discussed with general surgery Dr Bobbye Morton who recommend hold eliquis for 5 days  Hypoglycemia: Fasting Blood glucose 66 this am Likely Due to poor oral intake Start ensure supplement, encourage oral intake   AKI on CKD 2/UTI: -Possible hemodynamically related and  UTI -Renal US: Moderate bilateral renal cortical atrophy. No evidence of hydronephrosis. -Urine is dark and cloudy, urine culture positive for > 100,000 ecoli -She is allergic to penicillin and Cipro, started on Bactrim empirically , waiting for final urine culture result -Continue hold losartan -Creatinine elevated at 1.83 on presentation, cr normalized, today cr is 0.87 . -wbc 13.7 on presentation, wbc normalized today at 9.3  Hypokalemia Replaced and normalized  Hypomagnesemia; replaced, normalized  Persistent atrial fibrillation with h/o CVA SSS s/p PPM: CHA2DS2-VASc score 6.  Remains in A. fib with controlled rate. Labetalol and Cardizem held on admission due to hypotension bp started to go up, slowly increase lobetalol , continue hold Cardizem -was on heparin drip initially, transitioned to Eliquis on 9/5 with another BRBPR with bm on 94-9/5night . -she is reluctant to hold eliquis, she requests to talk to her cardiologist, case discussed with cardiology PA who agreed with holding eliquis today, cardiology will see patient in consult   Chronic diastolic CHF: EF 02-63% by TTE 09/09/2019.  Appears volume depleted on admission.  she received IV fluid hydration on admission.     I think she is intravascularly dry initially , she received LR bolus 1 L on presentation Will hold off IV hydration, encourage oral intake, elevate legs It appears that Lasix and spironolactone was discontinued on August 13 on her last discharge, she states has not restarted back on these,  Today her legs appear with some edema, I still think she is intravascularly dry due to poor oral intake, but will give one does of oral lasix 90m , will provide nutrition supplement ,monitor strict I/O's and daily weights.  Hypertension: she initially presented with hypotension, blood pressure 83/48 11 PM on 9/2,  has been borderline hypotension for a few hours from midnight to about 3:00 on 9/3, bp improved after holding  bp meds and ivf bolus Home BP meds diltiazem, labetalol, losartan on hold due to hypotension initially on presentation bp start to go up, slowly increase  labetalol , continue hold Cardizem and losartan  Hypothyroidism: tsh 1.58 Continue Synthroid.  Hyperlipidemia: Not currently on statin therapy due to previous rise in LFTs.  Right hip/leg pain right hip x-ray no acute findings Improving   Failure to thrive PT recommend SNF, social worker notified  DVT prophylaxis: SCDs Start: 03/16/20 2133   Code Status: DNR Family Communication: Husband at bedside Disposition:   Status is: Inpatient  Dispo: The patient is from: Home              Anticipated d/c is to: SNF              Anticipated d/c date is: awaiting urine culture result, monitor bleeding issues, hgb, needs SNF placement               Consultants:   General surgery  cardiology  Procedures:   None  Antimicrobials:   Bactrim     Objective: Vitals:   03/18/20 1254 03/18/20 1949 03/19/20 0231 03/19/20 0557  BP: 113/82 122/63 135/68   Pulse: (!) 108 97    Resp:  (!) 24 18  Temp:  98.8 F (37.1 C) 98.7 F (37.1 C)   TempSrc:  Oral Oral   SpO2:  97% 96%   Weight:    70.4 kg  Height:        Intake/Output Summary (Last 24 hours) at 03/19/2020 0917 Last data filed at 03/19/2020 0500 Gross per 24 hour  Intake 250 ml  Output 351 ml  Net -101 ml   Filed Weights   03/17/20 1718 03/18/20 0500 03/19/20 0557  Weight: 66.6 kg 70 kg 70.4 kg    Examination:  General exam: Very weak , frail , chronically ill-appearing  Respiratory system: Clear to auscultation. Respiratory effort normal. Cardiovascular system: IRRR, trace ankle/pedal edema  Gastrointestinal system: Abdomen is nondistended, soft and nontender. No organomegaly or masses felt. Normal bowel sounds heard. Central nervous system: Alert and oriented. No focal neurological deficits. Extremities: Generalized weakness Skin: Scattered ecchymosis  bilateral lower extremities Psychiatry: Judgement and insight appear normal. Mood & affect appropriate.     Data Reviewed: I have personally reviewed following labs and imaging studies  CBC: Recent Labs  Lab 03/15/20 1017 03/16/20 1706 03/17/20 0623 03/18/20 0527 03/19/20 0431  WBC  --  13.7* 11.9* 11.4* 9.3  NEUTROABS  --  11.3*  --   --  7.3  HGB 10.2* 7.6* 9.2* 9.2* 9.3*  HCT 30.0* 24.4* 28.8* 29.3* 29.3*  MCV  --  95.7 94.1 93.6 93.6  PLT  --  215 204 210 119    Basic Metabolic Panel: Recent Labs  Lab 03/15/20 1017 03/16/20 1706 03/17/20 0623 03/18/20 0527 03/19/20 0431  NA 142 134* 136 140 138  K 3.5 3.2* 4.0 3.9 4.0  CL 107 105 108 111 108  CO2  --  21* 21* 21* 20*  GLUCOSE 96 86 92 70 66*  BUN 11 19 19 15 14   CREATININE 1.10* 1.83* 1.29* 0.92 0.87  CALCIUM  --  8.8* 8.8* 9.0 8.8*  MG  --  1.8  --  1.7 2.2    GFR: Estimated Creatinine Clearance: 44.7 mL/min (by C-G formula based on SCr of 0.87 mg/dL).  Liver Function Tests: No results for input(s): AST, ALT, ALKPHOS, BILITOT, PROT, ALBUMIN in the last 168 hours.  CBG: No results for input(s): GLUCAP in the last 168 hours.   Recent Results (from the past 240 hour(s))  SARS CORONAVIRUS 2 (TAT 6-24 HRS) Nasopharyngeal Nasopharyngeal Swab     Status: None   Collection Time: 03/13/20  2:57 PM   Specimen: Nasopharyngeal Swab  Result Value Ref Range Status   SARS Coronavirus 2 NEGATIVE NEGATIVE Final    Comment: (NOTE) SARS-CoV-2 target nucleic acids are NOT DETECTED.  The SARS-CoV-2 RNA is generally detectable in upper and lower respiratory specimens during the acute phase of infection. Negative results do not preclude SARS-CoV-2 infection, do not rule out co-infections with other pathogens, and should not be used as the sole basis for treatment or other patient management decisions. Negative results must be combined with clinical observations, patient history, and epidemiological information. The  expected result is Negative.  Fact Sheet for Patients: SugarRoll.be  Fact Sheet for Healthcare Providers: https://www.woods-mathews.com/  This test is not yet approved or cleared by the [place] FDA and  has been authorized for detection and/or diagnosis of SARS-CoV-2 by FDA under an Emergency Use Authorization (EUA). This EUA will remain  in effect (meaning this test can be used) for the duration of the COVID-19 declaration under Se ction 564(b)(1) of the Act, 21 U.S.C. section  360bbb-3(b)(1), unless the authorization is terminated or revoked sooner.  Performed at [facility], [place] [address], [place], [place] [address]   SARS Coronavirus 2 by RT PCR (hospital order, performed in [facility] hospital lab) Nasopharyngeal Nasopharyngeal Swab     Status: None   Collection Time: 03/16/20  8:42 PM   Specimen: Nasopharyngeal Swab  Result Value Ref Range Status   SARS Coronavirus 2 NEGATIVE NEGATIVE Final    Comment: (NOTE) SARS-CoV-2 target nucleic acids are NOT DETECTED.  The SARS-CoV-2 RNA is generally detectable in upper and lower respiratory specimens during the acute phase of infection. The lowest concentration of SARS-CoV-2 viral copies this assay can detect is 250 copies / mL. A negative result does not preclude SARS-CoV-2 infection and should not be used as the sole basis for treatment or other patient management decisions.  A negative result may occur with improper specimen collection / handling, submission of specimen other than nasopharyngeal swab, presence of viral mutation(s) within the areas targeted by this assay, and inadequate number of viral copies (<250 copies / mL). A negative result must be combined with clinical observations, patient history, and epidemiological information.  Fact Sheet for Patients:   StrictlyIdeas.no  Fact Sheet for Healthcare  Providers: BankingDealers.co.za  This test is not yet approved or  cleared by the [place] FDA and has been authorized for detection and/or diagnosis of SARS-CoV-2 by FDA under an Emergency Use Authorization (EUA).  This EUA will remain in effect (meaning this test can be used) for the duration of the COVID-19 declaration under Section 564(b)(1) of the Act, 21 U.S.C. section 360bbb-3(b)(1), unless the authorization is terminated or revoked sooner.  Performed at [facility], [place] [address], [place], [place] [address]   Culture, Urine     Status: Abnormal (Preliminary result)   Collection Time: 03/17/20 11:25 AM   Specimen: Urine, Clean Catch  Result Value Ref Range Status   Specimen Description   Final    URINE, CLEAN CATCH Performed at [facility], [place] [address] [place], [place] [address]    Special Requests   Final    NONE Performed at [facility], [place] [address], [place], [place] [address]    Culture >=100,000 COLONIES/mL ESCHERICHIA COLI (A)  Final   Report Status PENDING  Incomplete         Radiology Studies: DG HIP UNILAT WITH PELVIS 2-3 VIEWS RIGHT  Result Date: 03/18/2020 CLINICAL DATA:  Acute right hip pain after surgery. EXAM: DG HIP (WITH OR WITHOUT PELVIS) 2-3V RIGHT COMPARISON:  None. FINDINGS: There is no evidence of hip fracture or dislocation. No significant joint space narrowing is noted. Mild osteophyte formation is noted involving the acetabulum. IMPRESSION: Mild degenerative changes are noted. No acute abnormality is noted. Electronically Signed   By: Marijo Conception M.D.   On: 03/18/2020 11:34        Scheduled Meds: . apixaban  5 mg Oral BID  . feeding supplement (ENSURE ENLIVE)  237 mL Oral BID BM  . furosemide  40 mg Oral Once  . labetalol  100 mg Oral BID  . levothyroxine  25 mcg Oral Q0600  . senna-docusate  1 tablet Oral BID  . sodium chloride flush  3 mL  Intravenous Q12H  . sulfamethoxazole-trimethoprim  1 tablet Oral Once  . sulfamethoxazole-trimethoprim  1 tablet Oral Q12H   Continuous Infusions: . sodium chloride       LOS: 1 day  Time spent: 2mns Greater than 50% of this time was spent in counseling, explanation of diagnosis, planning of further management, and coordination of care.  I have personally reviewed and interpreted on  03/19/2020 daily labs, tele strips, imagings as discussed above under date review session and assessment and plans.  I reviewed all nursing notes, pharmacy notes, consultant notes,  vitals, pertinent old records  I have discussed plan of care as described above with RN , patient and family on 03/19/2020  Voice Recognition /Dragon dictation system was used to create this note, attempts have been made to correct errors. Please contact the author with questions and/or clarifications.   FFlorencia Reasons MD PhD FACP [facility]  Available via Epic secure chat 7am-7pm for nonurgent issues Please page for urgent issues To page the attending provider between 7A-7P or the covering provider during after hours 7P-7A, please log into the web site [email or web address] and access using universal [place] password for that web site. If you do not have the password, please call the hospital operator.    03/19/2020, 9:17 AM

## 2020-03-19 NOTE — Progress Notes (Signed)
Pt has had a total of 4 BMs since 2am this morning. Stools have been mushy in consistence with a black color. MD aware.

## 2020-03-19 NOTE — Progress Notes (Addendum)
Urine culture came back posistive for E.coli. Night hospitalist notified. Pt. Currently on PO antibiotic. Will continue to monitor.   Patient had large BM which was painful for her after rectal surgery. Tylenol and lidocaine cream applied. Fissures around rectum appear more open/ bloody than before. Approx. 100 cc bright red blood in stool.  Can we do anything to close / heal area faster ? Medi honey or skin glue?

## 2020-03-19 NOTE — Progress Notes (Signed)
Patient evaluated due to call from primary team regarding rectal bleeding s/p 3 column hemorrhoidectomy. Patient remains on Eliquis for history of stroke and I would recommend cessation of this medication until hemostasis has been achieved clinically. I discussed this with the patient who is very hesitant to hold Eliquis and we discussed engaging her cardiologist, Dr. Caryl Comes, to weigh in. This was communicated to the primary team who will reach out to cardiology. Primary team states the Eliquis was only held ~24h peri-operatively, so cardiac risks may outweigh benefits of completely discontinuing anticoagulation, in which case, a longer bridge with a heparin drip would be a reasonable compromise to be able to discontinue/reverse more rapidly. Transfuse PRN hgb<7 and symptomatic. Please call with additional questions/concerns.   Jesusita Oka, MD General and [facility]

## 2020-03-20 DIAGNOSIS — I5032 Chronic diastolic (congestive) heart failure: Secondary | ICD-10-CM

## 2020-03-20 DIAGNOSIS — N179 Acute kidney failure, unspecified: Secondary | ICD-10-CM

## 2020-03-20 DIAGNOSIS — I10 Essential (primary) hypertension: Secondary | ICD-10-CM

## 2020-03-20 DIAGNOSIS — D649 Anemia, unspecified: Secondary | ICD-10-CM

## 2020-03-20 DIAGNOSIS — E039 Hypothyroidism, unspecified: Secondary | ICD-10-CM

## 2020-03-20 DIAGNOSIS — I4891 Unspecified atrial fibrillation: Secondary | ICD-10-CM

## 2020-03-20 DIAGNOSIS — K921 Melena: Secondary | ICD-10-CM

## 2020-03-20 DIAGNOSIS — I4821 Permanent atrial fibrillation: Secondary | ICD-10-CM

## 2020-03-20 LAB — GLUCOSE, CAPILLARY: Glucose-Capillary: 109 mg/dL — ABNORMAL HIGH (ref 70–99)

## 2020-03-20 LAB — BASIC METABOLIC PANEL
Anion gap: 9 (ref 5–15)
BUN: 12 mg/dL (ref 8–23)
CO2: 21 mmol/L — ABNORMAL LOW (ref 22–32)
Calcium: 8.6 mg/dL — ABNORMAL LOW (ref 8.9–10.3)
Chloride: 106 mmol/L (ref 98–111)
Creatinine, Ser: 0.87 mg/dL (ref 0.44–1.00)
GFR calc Af Amer: >60 mL/min (ref >60)
GFR calc non Af Amer: >60 mL/min (ref >60)
Glucose, Bld: 80 mg/dL (ref 70–99)
Potassium: 3.8 mmol/L (ref 3.5–5.1)
Sodium: 136 mmol/L (ref 135–145)

## 2020-03-20 LAB — HEMOGLOBIN AND HEMATOCRIT, BLOOD
HCT: 27.7 % — ABNORMAL LOW (ref 36.0–46.0)
Hemoglobin: 9 g/dL — ABNORMAL LOW (ref 12.0–15.0)

## 2020-03-20 LAB — CBC
HCT: 30.2 % — ABNORMAL LOW (ref 36.0–46.0)
Hemoglobin: 9.4 g/dL — ABNORMAL LOW (ref 12.0–15.0)
MCH: 29.1 pg (ref 26.0–34.0)
MCHC: 31.1 g/dL (ref 30.0–36.0)
MCV: 93.5 fL (ref 80.0–100.0)
Platelets: 237 10*3/uL (ref 150–400)
RBC: 3.23 MIL/uL — ABNORMAL LOW (ref 3.87–5.11)
RDW: 17.4 % — ABNORMAL HIGH (ref 11.5–15.5)
WBC: 6 10*3/uL (ref 4.0–10.5)
nRBC: 0 % (ref 0.0–0.2)

## 2020-03-20 MED ORDER — LABETALOL HCL 300 MG PO TABS
150.0000 mg | ORAL_TABLET | Freq: Every day | ORAL | Status: DC
  Administered 2020-03-20: 150 mg via ORAL
  Filled 2020-03-20: qty 1

## 2020-03-20 MED ORDER — LABETALOL HCL 300 MG PO TABS
450.0000 mg | ORAL_TABLET | Freq: Every day | ORAL | Status: DC
  Administered 2020-03-21 – 2020-03-23 (×3): 450 mg via ORAL
  Filled 2020-03-20 (×3): qty 2

## 2020-03-20 MED ORDER — LABETALOL HCL 300 MG PO TABS
300.0000 mg | ORAL_TABLET | Freq: Every morning | ORAL | Status: DC
  Administered 2020-03-21 – 2020-03-24 (×4): 300 mg via ORAL
  Filled 2020-03-20 (×4): qty 1

## 2020-03-20 MED ORDER — LABETALOL HCL 300 MG PO TABS
300.0000 mg | ORAL_TABLET | Freq: Two times a day (BID) | ORAL | Status: DC
  Administered 2020-03-20 (×2): 300 mg via ORAL
  Filled 2020-03-20 (×2): qty 1

## 2020-03-20 MED ORDER — HYDROXYZINE HCL 10 MG PO TABS
10.0000 mg | ORAL_TABLET | Freq: Three times a day (TID) | ORAL | Status: DC | PRN
  Administered 2020-03-20 – 2020-03-23 (×3): 10 mg via ORAL
  Filled 2020-03-20 (×4): qty 1

## 2020-03-20 MED ORDER — POLYVINYL ALCOHOL 1.4 % OP SOLN
1.0000 [drp] | OPHTHALMIC | Status: DC | PRN
  Administered 2020-03-20: 1 [drp] via OPHTHALMIC
  Filled 2020-03-20: qty 15

## 2020-03-20 NOTE — Progress Notes (Signed)
Patient ID: [name] [name], female   DOB: 1930-08-26, 86 y.o.   MRN: [id number]   Denies rectal bleeding overnight On exam, no bleeding seen at the sight of surgery Hgb stable  Would continue current care If she does not have bright blood from bottom today, could consider restarting Eliquis tomorrow

## 2020-03-20 NOTE — Progress Notes (Signed)
Update given to patients daughter, Amy.

## 2020-03-20 NOTE — Progress Notes (Signed)
Physical Therapy Treatment Patient Details Name: [name] [name] MRN: [id number] DOB: [birth year] Today's Date: 03/20/2020    History of Present Illness 86 y.o. female with medical history significant for permanent atrial fibrillation on Eliquis, SSS s/p PPM, chronic diastolic CHF (EF 94-17% by TTE 09/09/2019), history of left parietal ICH/CVA, nonobstructive CAD, hypothyroidism, HTN, and HLD . Patient was recently admitted from 02/21/2020-02/25/2020 for acute lower GI bleeding secondary to internal hemorrhoids  She underwent outpatient hemorrhoidectomy (03/15/2020) and was discharged home. returned to ER for generalized weakness.    PT Comments    Progressing with mobility. Pt c/o general malaise on today. She was agreeable to mobilize with some encouragement. She likely needs to be up mobilizing more (walking to bathroom, OOB to chair with nursing). Discussed d/c plan-pt stated she plans to return home. She feels that she and her husband can manage at home. She is agreeable to HHPT. Will continue to follow.    Follow Up Recommendations  Home health PT;Supervision/Assistance - 24 hour (pt prefers to d/c home)     Equipment Recommendations  None recommended by PT    Recommendations for Other Services       Precautions / Restrictions Precautions Precautions: Fall Restrictions Weight Bearing Restrictions: No    Mobility  Bed Mobility Overal bed mobility: Needs Assistance Bed Mobility: Supine to Sit     Supine to sit: Supervision     General bed mobility comments: Increased time.  Transfers Overall transfer level: Needs assistance Equipment used: Rolling walker (2 wheeled) Transfers: Sit to/from Stand Sit to Stand: Supervision            Ambulation/Gait Ambulation/Gait assistance: Min guard Gait Distance (Feet): 20 Feet Assistive device: Rolling walker (2 wheeled) Gait Pattern/deviations: Step-through pattern;Decreased stride length     General Gait Details: walked a  lap around room. distance limited by fatigue. mild lightheadedness.   Stairs             Wheelchair Mobility    Modified Rankin (Stroke Patients Only)       Balance Overall balance assessment: Needs assistance         Standing balance support: Bilateral upper extremity supported Standing balance-Leahy Scale: Poor                              Cognition Arousal/Alertness: Awake/alert Behavior During Therapy: WFL for tasks assessed/performed Overall Cognitive Status: Within Functional Limits for tasks assessed                                 General Comments: c/o general malaise      Exercises      General Comments        Pertinent Vitals/Pain Pain Assessment: 0-10 Faces Pain Scale: Hurts little more Pain Location: bil anterior lega Pain Descriptors / Indicators: Discomfort;Sore Pain Intervention(s): Monitored during session;Repositioned    Home Living                      Prior Function            PT Goals (current goals can now be found in the care plan section) Progress towards PT goals: Progressing toward goals    Frequency    Min 3X/week      PT Plan Discharge plan needs to be updated    Co-evaluation  AM-PAC PT "6 Clicks" Mobility   Outcome Measure  Help needed turning from your back to your side while in a flat bed without using bedrails?: A Little Help needed moving from lying on your back to sitting on the side of a flat bed without using bedrails?: A Little Help needed moving to and from a bed to a chair (including a wheelchair)?: A Little Help needed standing up from a chair using your arms (e.g., wheelchair or bedside chair)?: A Little Help needed to walk in hospital room?: A Little Help needed climbing 3-5 steps with a railing? : A Little 6 Click Score: 18    End of Session Equipment Utilized During Treatment: Gait belt Activity Tolerance: Patient limited by  fatigue Patient left: in chair;with call bell/phone within reach;with family/visitor present   PT Visit Diagnosis: Muscle weakness (generalized) (M62.81);Difficulty in walking, not elsewhere classified (R26.2) Pain - Right/Left:  (bil) Pain - part of body: Leg     Time: 3300-7622 PT Time Calculation (min) (ACUTE ONLY): 21 min  Charges:  $Gait Training: 8-22 mins                         Doreatha Massed, PT Acute Rehabilitation  Office: [phone] Pager: [phone]

## 2020-03-20 NOTE — Progress Notes (Addendum)
PROGRESS NOTE    [name] [name]  YSA:[id number]  DOB: 1930-08-28  PCP: [name], MD Admit date:03/16/2020 Chief compliant: Weakness 86 y.o.femalewith medical history significant forpermanent atrial fibrillation on Eliquis, SSS s/p PPM, chronic diastolic CHF (EF 32-35% by TTE 09/09/2019), history of left parietal ICH/CVA, nonobstructive CAD, hypothyroidism, HTN, and HLD who presents to the ED for evaluation of generalized weakness and also complained of right upper thigh/hip pain but denied any falls or injuries. Patient was recently admitted from 02/21/2020-02/25/2020 for acute lower GI bleeding secondary to internal hemorrhoids. During admission she was transfused 1 unit PRBC. She underwent outpatient hemorrhoidectomy by general surgery, Dr. Leighton Ruff, yesterday (03/15/2020). Eliquis was held 24hours prior to the procedure.Preoperative labs were notable for hemoglobin 10.2 and creatinine 1.10 (baseline creatinine 0.8-0.9). She was dischargedtohome same day after surgery.  She had resumed her home dose of Eliquis on the morning of admission. ED Course: Afebrile,BP 94/44, pulse 70, RR 18, 94% on room air.Labs notable for hemoglobin 7.6, WBC 13.7, platelets 215,000, creatinine 1.83, sodium 134, potassium 3.2, bicarb 21, BUN 19, serum glucose 86.  She had received 800 cc normal saline by EMS in route, started on 1 unit PRBC transfusion in the ED Hospital course: Patient admitted to [facility] for further evaluation and management of AKI and anemia. Eliquis was held on admission and she was started on heparin drip.  No evidence of GI bleed on heparin, her Eliquis was restarted on 9/4.  Subsequently had another episode of bright red blood per rectum on 9/4 and Eliquis was again held.  General surgery was consulted who felt recurrent GI bleed was due to premature resumption of anticoagulation and recommended holding Eliquis for 5 days.  Seen by physical therapy and initial hospital course and recommended  SNF but since then patient has had some improvement.  Subjective:  Patient resting comfortably in no acute distress.  Appears anxious about holding her anticoagulation but is relieved after talking to cardiologist and general surgery this morning.  She did have 1 episode of hematochezia this morning-very mild per patient and per bedside nurse.  Objective: Vitals:   03/20/20 0439 03/20/20 0952 03/20/20 1456 03/20/20 1524  BP: 118/80 (!) 132/92 118/71 123/73  Pulse:  (!) 124  94  Resp: 19   19  Temp: 98.4 F (36.9 C)   98.2 F (36.8 C)  TempSrc: Oral   Oral  SpO2: 99%  97% 98%  Weight:      Height:        Intake/Output Summary (Last 24 hours) at 03/20/2020 1849 Last data filed at 03/20/2020 1806 Gross per 24 hour  Intake 820 ml  Output 500 ml  Net 320 ml   Filed Weights   03/17/20 1718 03/18/20 0500 03/19/20 0557  Weight: 66.6 kg 70 kg 70.4 kg    Physical Examination:  General: Moderately built, no acute distress noted Head ENT: Atraumatic normocephalic, PERRLA, neck supple Heart: S1-S2 heard, irregular rate and rhythm, no murmurs.  No leg edema noted Lungs: Equal air entry bilaterally, no rhonchi or rales on exam, no accessory muscle use Abdomen: Bowel sounds heard, soft, nontender, nondistended. No organomegaly.  No CVA tenderness Extremities: No pedal edema.  No cyanosis or clubbing. Neurological: Awake alert oriented x3, no focal weakness or numbness, strength and sensations to crude touch intact Skin: No wounds or rashes.  Data Reviewed: I have personally reviewed following labs and imaging studies  CBC: Recent Labs  Lab 03/16/20 1706 03/16/20 1706 03/17/20 5732 03/17/20 2025  03/18/20 0527 03/19/20 0431 03/19/20 1714 03/20/20 0507 03/20/20 1713  WBC 13.7*  --  11.9*  --  11.4* 9.3  --  6.0  --   NEUTROABS 11.3*  --   --   --   --  7.3  --   --   --   HGB 7.6*   < > 9.2*   < > 9.2* 9.3* 9.4* 9.4* 9.0*  HCT 24.4*   < > 28.8*   < > 29.3* 29.3* 29.8* 30.2*  27.7*  MCV 95.7  --  94.1  --  93.6 93.6  --  93.5  --   PLT 215  --  204  --  210 233  --  237  --    < > = values in this interval not displayed.   Basic Metabolic Panel: Recent Labs  Lab 03/16/20 1706 03/17/20 0623 03/18/20 0527 03/19/20 0431 03/20/20 0507  NA 134* 136 140 138 136  K 3.2* 4.0 3.9 4.0 3.8  CL 105 108 111 108 106  CO2 21* 21* 21* 20* 21*  GLUCOSE 86 92 70 66* 80  BUN 19 19 15 14 12   CREATININE 1.83* 1.29* 0.92 0.87 0.87  CALCIUM 8.8* 8.8* 9.0 8.8* 8.6*  MG 1.8  --  1.7 2.2  --    GFR: Estimated Creatinine Clearance: 44.7 mL/min (by C-G formula based on SCr of 0.87 mg/dL). Liver Function Tests: No results for input(s): AST, ALT, ALKPHOS, BILITOT, PROT, ALBUMIN in the last 168 hours. No results for input(s): LIPASE, AMYLASE in the last 168 hours. No results for input(s): AMMONIA in the last 168 hours. Coagulation Profile: No results for input(s): INR, PROTIME in the last 168 hours. Cardiac Enzymes: No results for input(s): CKTOTAL, CKMB, CKMBINDEX, TROPONINI in the last 168 hours. BNP (last 3 results) No results for input(s): PROBNP in the last 8760 hours. HbA1C: No results for input(s): HGBA1C in the last 72 hours. CBG: Recent Labs  Lab 03/20/20 1502  GLUCAP 109*   Lipid Profile: No results for input(s): CHOL, HDL, LDLCALC, TRIG, CHOLHDL, LDLDIRECT in the last 72 hours. Thyroid Function Tests: Recent Labs    03/19/20 0431  TSH 1.587   Anemia Panel: No results for input(s): VITAMINB12, FOLATE, FERRITIN, TIBC, IRON, RETICCTPCT in the last 72 hours. Sepsis Labs: No results for input(s): PROCALCITON, LATICACIDVEN in the last 168 hours.  Recent Results (from the past 240 hour(s))  SARS CORONAVIRUS 2 (TAT 6-24 HRS) Nasopharyngeal Nasopharyngeal Swab     Status: None   Collection Time: 03/13/20  2:57 PM   Specimen: Nasopharyngeal Swab  Result Value Ref Range Status   SARS Coronavirus 2 NEGATIVE NEGATIVE Final    Comment: (NOTE) SARS-CoV-2  target nucleic acids are NOT DETECTED.  The SARS-CoV-2 RNA is generally detectable in upper and lower respiratory specimens during the acute phase of infection. Negative results do not preclude SARS-CoV-2 infection, do not rule out co-infections with other pathogens, and should not be used as the sole basis for treatment or other patient management decisions. Negative results must be combined with clinical observations, patient history, and epidemiological information. The expected result is Negative.  Fact Sheet for Patients: SugarRoll.be  Fact Sheet for Healthcare Providers: https://www.woods-mathews.com/  This test is not yet approved or cleared by the [place] FDA and  has been authorized for detection and/or diagnosis of SARS-CoV-2 by FDA under an Emergency Use Authorization (EUA). This EUA will remain  in effect (meaning this test can be used) for the  duration of the COVID-19 declaration under Se ction 564(b)(1) of the Act, 21 U.S.C. section 360bbb-3(b)(1), unless the authorization is terminated or revoked sooner.  Performed at [facility], [place] [address] [place], [place] [address]   SARS Coronavirus 2 by RT PCR (hospital order, performed in [facility] hospital lab) Nasopharyngeal Nasopharyngeal Swab     Status: None   Collection Time: 03/16/20  8:42 PM   Specimen: Nasopharyngeal Swab  Result Value Ref Range Status   SARS Coronavirus 2 NEGATIVE NEGATIVE Final    Comment: (NOTE) SARS-CoV-2 target nucleic acids are NOT DETECTED.  The SARS-CoV-2 RNA is generally detectable in upper and lower respiratory specimens during the acute phase of infection. The lowest concentration of SARS-CoV-2 viral copies this assay can detect is 250 copies / mL. A negative result does not preclude SARS-CoV-2 infection and should not be used as the sole basis for treatment or other patient management decisions.  A negative result may occur  with improper specimen collection / handling, submission of specimen other than nasopharyngeal swab, presence of viral mutation(s) within the areas targeted by this assay, and inadequate number of viral copies (<250 copies / mL). A negative result must be combined with clinical observations, patient history, and epidemiological information.  Fact Sheet for Patients:   StrictlyIdeas.no  Fact Sheet for Healthcare Providers: BankingDealers.co.za  This test is not yet approved or  cleared by the [place] FDA and has been authorized for detection and/or diagnosis of SARS-CoV-2 by FDA under an Emergency Use Authorization (EUA).  This EUA will remain in effect (meaning this test can be used) for the duration of the COVID-19 declaration under Section 564(b)(1) of the Act, 21 U.S.C. section 360bbb-3(b)(1), unless the authorization is terminated or revoked sooner.  Performed at [facility], [place] [address], [place], [place] [address]   Culture, Urine     Status: Abnormal   Collection Time: 03/17/20 11:25 AM   Specimen: Urine, Clean Catch  Result Value Ref Range Status   Specimen Description   Final    URINE, CLEAN CATCH Performed at [facility], [place] [address] [place], [place] [address]    Special Requests   Final    NONE Performed at [facility], [place] [address], [place],  [address]    Culture >=100,000 COLONIES/mL ESCHERICHIA COLI (A)  Final   Report Status 03/19/2020 FINAL  Final   Organism ID, Bacteria ESCHERICHIA COLI (A)  Final      Susceptibility   Escherichia coli - MIC*    AMPICILLIN <=2 SENSITIVE Sensitive     CEFAZOLIN <=4 SENSITIVE Sensitive     CEFTRIAXONE <=0.25 SENSITIVE Sensitive     CIPROFLOXACIN <=0.25 SENSITIVE Sensitive     GENTAMICIN <=1 SENSITIVE Sensitive     IMIPENEM <=0.25 SENSITIVE Sensitive     NITROFURANTOIN <=16 SENSITIVE Sensitive      TRIMETH/SULFA <=20 SENSITIVE Sensitive     AMPICILLIN/SULBACTAM <=2 SENSITIVE Sensitive     PIP/TAZO <=4 SENSITIVE Sensitive     * >=100,000 COLONIES/mL ESCHERICHIA COLI      Radiology Studies: No results found.    Scheduled Meds: . feeding supplement (ENSURE ENLIVE)  237 mL Oral BID BM  . labetalol  150 mg Oral QHS  . labetalol  300 mg Oral BID  . levothyroxine  25 mcg Oral Q0600  . sodium chloride flush  3 mL Intravenous Q12H  . sulfamethoxazole-trimethoprim  1 tablet Oral Once  . sulfamethoxazole-trimethoprim  1 tablet  Oral Q12H   Continuous Infusions: . sodium chloride        Assessment/Plan:  1.  Recurrent rectal bleeding with symptomatic anemia: Patient had hemoglobin 7.6 on presentation, improved to 9.2 with 1 unit PRBC transfusion.  Eliquis held on admission and patient monitored on heparin drip due to high stroke risk.  She did well on heparin drip without GI bleed but had recurrence when resumed on Eliquis 9/4 as well as 9/5.  Seen by general surgery yesterday and recommended to hold anticoagulation for 5 days.  Patient reluctant given high stroke risk, seen by Dr. Ninfa Linden with GS today and given no further recurrence overnight (albeit a small amount this morning), feel okay to resume in the morning while weighing in cardiac/stroke risks.  Continue to monitor today and repeat H&H in a.m.  2.  AKI on CKD stage IIIa: Creatinine elevated at 1.83 on presentation likely due to bleeding/hypotension/dehydration and now normalized.  No evidence of hydronephrosis and moderate bilateral renal cortical atrophy on renal ultrasound.    3.  E. coli UTI: Being treated with Bactrim (allergic to Cipro/penicillin).  ACE inhibitor was held.  Follow-up final ID and sensitivity, titrate as needed.  4.  Chronic atrial fibrillation, sick sinus syndrome status post pacemaker: Patient has high CHA2DS2-VASc 2 score of 7.  Calcium channel blockers and labetalol held on admission due to hypotension.   Now resumed in cardiology adjusted labetalol dosage back to home dose.  Cardiology also recommends resuming anticoagulation with Eliquis if no further bleeding and cleared by surgery in a.m.  If still concerned for recurrent bleeding, recommend IV heparin and monitoring.  5.  Rectal hemorrhoids: Patient underwent hemorrhoidectomy on 9/1, hemoglobin preoperatively was 10.2.  Now dropped as discussed above and received PRBC transfusion in the ED.  Anticoagulation currently on hold as discussed above  6.  Chronic diastolic CHF: Hypovolemic/hypotensive on presentation with AKI requiring IV hydration.  Now renal function normalized and off IV fluids.  Echo in February showed preserved EF.  7.  History of hypertension with hypovolemic hypotension: Present on admission.  Home medications including diltiazem, labetalol and losartan were held on admission now resumed on labetalol home dose.  Losartan remains on hold.  Check orthostatics.  8. Hypothyroidism: Resume home medications  DVT prophylaxis: SCDs, resume anticoagulation hopefully in a.m. Code Status: DNR Family / Patient Communication: Discussed with patient and bedside nurse Disposition Plan:   Status is: Inpatient  Remains inpatient appropriate because:Inpatient level of care appropriate due to severity of illness   Dispo: The patient is from: Home              Anticipated d/c is to: home with home health PT/24-hour supervision per PT recommendations today              Anticipated d/c date is: 2 days              Patient currently is not medically stable to d/c.     Time spent: 25 mins     >50% time spent in discussions with care team and coordination of care.    Guilford Shi, MD [facility] Pager in [place]  If 7PM-7AM, please contact night-coverage [email or web address] 03/20/2020, 6:49 PM

## 2020-03-20 NOTE — Consult Note (Signed)
Cardiology Consultation:   Patient ID: ADAMA IVINS MRN: [id number]; DOB: Jun 24, 1931  Admit date: 03/16/2020 Date of Consult: 03/20/2020  Primary Care Provider: [name], MD [facility] Cardiologist: Virl Axe, MD  [facility] Electrophysiologist:  Virl Axe, MD    Patient Profile:   [name] [name] is a 86 y.o. female with a hx of permanent atrial fibrillation on Eliquis, sick sinus syndrome status post PPM, chronic diastolic heart failure (EF 49 to 60%), CVA, nonobstructive CAD, hypertension, hypothyroidism who is being seen today for the evaluation of atrial fibrillationt at the request of Dr Erlinda Hong.  History of Present Illness:   [name] [name] had recent admission from 02/21/20 through 02/25/2020 with GI bleed secondary to hemorrhoids.  She was transfused 1 unit PRBCs during that admission.  Underwent hemorrhoidectomy as outpatient on 03/15/2020.  Eliquis was held for 24 hours prior to procedure.  She was discharged home same day after surgery.  She restarted her Eliquis on 9/2.  She presented to [facility] long ED on 03/16/2020 with generalized weakness.  She did report a small amount of rectal bleeding.  Labs notable for hemoglobin 7.6 (preoperative hemoglobin 10.2), creatinine 1.8.  She was given 1 unit PRBCs.  Eliquis was held on admission and she was started on heparin drip.  No evidence of GI bleed on heparin, her Eliquis was restarted on 9/4.  Subsequently had another episode of bright red blood per rectum on 9/4 and Eliquis was again held.  General surgery was consulted and recommended holding Eliquis for now.  Hemoglobin stable at 9.4 today.  She denies any chest pain or dyspnea.   Past Medical History:  Diagnosis Date  . Anemia due to chronic blood loss 12/2019   rectal bleeding  . Bilateral lower extremity edema   . CAD (coronary artery disease) cardiologist-- dr Caryl Comes   a. LHC 10/2017 30% RCA otherwise OK.  (nonobstructive cad)  . Chronic diastolic CHF (congestive heart  failure) ([place])    followed by dr Caryl Comes  . Chronic venous insufficiency   . Hemorrhoids   . History of adenomatous polyp of colon   . History of hemorrhagic cerebrovascular accident (CVA) without residual deficits 03/25/2015   (previous seen by neuologist--- dr Erlinda Hong   ICH left posterior parietal in setting  HTN/ and pt taking coumadin;   per pt no residual  . History of kidney stones   . History of recurrent UTIs   . HLD (hyperlipidemia)   . HTN (hypertension)    followed by dr Caryl Comes and pcp  . Hypothyroidism    followed by pcp  . NASH (nonalcoholic steatohepatitis)    hx medication induced hepatitis (vytorin per pt)  . OA (osteoarthritis)   . Pacemaker-Medtronic 07/29/2011  implanted   followed by dr Caryl Comes  . Permanent atrial fibrillation [facility])    cardiology--- dr Caryl Comes--- hx several DCCVs  . PMR (polymyalgia rheumatica) ([place])    03-14-2020  per pt has not had any issues in several years  . Rectal bleeding   . sinus node dysfunction//post termination pauses   . SSS (sick sinus syndrome) ([place])    s/p  PPM 07-29-2011  . Thoracic aortic aneurysm (TAA) ([place])    a. followed by Dr. Servando Snare.    Past Surgical History:  Procedure Laterality Date  . BIOPSY  02/23/2020   Procedure: BIOPSY;  Surgeon: Clarene Essex, MD;  Location: [facility] ENDOSCOPY;  Service: Endoscopy;;  . CARDIAC CATHETERIZATION  12-05-2006  dr cooper   nonobstructive cad  . CARDIAC  PACEMAKER PLACEMENT  07-29-2011   dr Valaria Good (dual)  . CARDIOVERSION  03/02/2012   Procedure: CARDIOVERSION;  Surgeon: Lelon Perla, MD;  Location: [facility] ENDOSCOPY;  Service: Cardiovascular;  Laterality: N/A;  . CARDIOVERSION  03/13/2012   Procedure: CARDIOVERSION;  Surgeon: Josue Hector, MD;  Location: [place];  Service: Cardiovascular;  Laterality: N/A;  . CARDIOVERSION  04/15/2012   Procedure: CARDIOVERSION;  Surgeon: [name], MD;  Location: [place];  Service: Cardiovascular;  Laterality: N/A;  . CARDIOVERSION N/A  06/07/2014   Procedure: CARDIOVERSION;  Surgeon: [name], MD;  Location: [facility] ENDOSCOPY;  Service: Cardiovascular;  Laterality: N/A;  . CARDIOVERSION N/A 08/18/2014   Procedure: CARDIOVERSION;  Surgeon: [name], MD;  Location: [place];  Service: Cardiovascular;  Laterality: N/A;  . CARDIOVERSION N/A 09/06/2015   Procedure: CARDIOVERSION;  Surgeon: Jerline Pain, MD;  Location: [place];  Service: Cardiovascular;  Laterality: N/A;  . COLONOSCOPY N/A 02/23/2020   Procedure: COLONOSCOPY possible flex sig only;  Surgeon: Clarene Essex, MD;  Location: [facility] ENDOSCOPY;  Service: Endoscopy;  Laterality: N/A;  . CYSTOSCOPY WITH RETROGRADE PYELOGRAM, URETEROSCOPY AND STENT PLACEMENT Right 10/ 2017  @[facility]   . CYSTOSCOPY WITH STENT PLACEMENT Right 04/22/2016   Procedure: CYSTOSCOPY WITH RIGHT URETERAL STENT INSERTION;  Surgeon: [name], MD;  Location: [place];  Service: Urology;  Laterality: Right;  . CYSTOSCOPY WITH URETEROSCOPY, STONE BASKETRY AND STENT PLACEMENT Right 04/30/2016   Procedure: CYSTOSCOPY WITH URETEROSCOPY, STENT PLACEMENT REMOVAL;  Surgeon: [name], MD;  Location: [facility] ORS;  Service: Urology;  Laterality: Right;  . HEMORRHOID SURGERY N/A 03/15/2020   Procedure: 3 COLUMN HEMORRHOIDECTOMY;  Surgeon: Leighton Ruff, MD;  Location: [facility];  Service: General;  Laterality: N/A;  . [place]  . LEFT HEART CATH AND CORONARY ANGIOGRAPHY N/A 10/28/2017   Procedure: LEFT HEART CATH AND CORONARY ANGIOGRAPHY;  Surgeon: Lorretta Harp, MD;  Location: [place] CV LAB;  Service: Cardiovascular;  Laterality: N/A;  . PERMANENT PACEMAKER INSERTION N/A 07/29/2011   Procedure: PERMANENT PACEMAKER INSERTION;  Surgeon: [name], MD;  Location: [facility] CATH LAB;  Service: Cardiovascular;  Laterality: N/A;  . UMBILICAL HERNIA REPAIR  1950's      Inpatient Medications: Scheduled Meds: . feeding supplement (ENSURE ENLIVE)  237 mL Oral BID  BM  . labetalol  200 mg Oral BID  . levothyroxine  25 mcg Oral Q0600  . sodium chloride flush  3 mL Intravenous Q12H  . sulfamethoxazole-trimethoprim  1 tablet Oral Once  . sulfamethoxazole-trimethoprim  1 tablet Oral Q12H   Continuous Infusions: . sodium chloride     PRN Meds: acetaminophen **OR** acetaminophen, lidocaine, ondansetron **OR** ondansetron (ZOFRAN) IV  Allergies:    Allergies  Allergen Reactions  . Penicillins Anaphylaxis    Has patient had a PCN reaction causing immediate rash, facial/tongue/throat swelling, SOB or lightheadedness with hypotension: Yes Has patient had a PCN reaction causing severe rash involving mucus membranes or skin necrosis: No Has patient had a PCN reaction that required hospitalization No Has patient had a PCN reaction occurring within the last 10 years: No If all of the above answers are "NO", then may proceed with Cephalosporin use.   . Statins Other (See Comments)    REACTION: elevated LFT's  . Tylenol [Acetaminophen] Other (See Comments)    If taken with Vytorin at risk for liver damage  . Adhesive [Tape] Other (See Comments)    Irritates skin  .  Amiodarone Other (See Comments)    unk  . Quinolones Other (See Comments)    UNk  . Tape Other (See Comments)    Redness, Please use "paper" tape only.  . Codeine Other (See Comments)    feel funny, head is fuzzy    Social History:   Social History   Socioeconomic History  . Marital status: Married    Spouse name: Not on file  . Number of children: Not on file  . Years of education: Not on file  . Highest education level: Not on file  Occupational History  . Not on file  Tobacco Use  . Smoking status: Former Smoker    Years: 20.00    Types: Cigarettes    Quit date: 07/16/1990    Years since quitting: 29.6  . Smokeless tobacco: Never Used  Vaping Use  . Vaping Use: Never used  Substance and Sexual Activity  . Alcohol use: No  . Drug use: No  . Sexual activity: Not on file    Other Topics Concern  . Not on file  Social History Narrative   ** Merged History Encounter **       Pt lives in [place] with spouse.  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 Difficulty of Paying Living Expenses: Not on file  Food Insecurity:   . Worried About Charity fundraiser in the Last Year: Not on file  . Ran Out of Food in the Last Year: Not on file  Transportation Needs:   . Lack of Transportation (Medical): Not on file  . Lack of Transportation (Non-Medical): Not on file  Physical Activity:   . Days of Exercise per Week: Not on file  . Minutes of Exercise per Session: Not on file  Stress:   . Feeling of Stress : Not on file  Social Connections:   . Frequency of Communication with Friends and Family: Not on file  . Frequency of Social Gatherings with Friends and Family: Not on file  . Attends Religious Services: Not on file  . Active Member of Clubs or Organizations: Not on file  . Attends Archivist Meetings: Not on file  . Marital Status: Not on file  Intimate Partner Violence:   . Fear of Current or Ex-Partner: Not on file  . Emotionally Abused: Not on file  . Physically Abused: Not on file  . Sexually Abused: Not on file    Family History:    Family History  Problem Relation Age of Onset  . Coronary artery disease Mother   . Coronary artery disease Other   . Alzheimer's disease Other   . Coronary artery disease Brother   . Coronary artery disease Brother      ROS:  Please see the history of present illness.   All other ROS reviewed and negative.     Physical Exam/Data:   Vitals:   03/19/20 1321 03/19/20 2140 03/19/20 2228 03/20/20 0439  BP: (!) 106/58 (!) 155/81 (!) 155/81 118/80  Pulse: 87     Resp: 20 20 (!) 22 19  Temp: 98.2 F (36.8 C) 99 F (37.2 C) 99 F (37.2 C) 98.4 F (36.9 C)  TempSrc: Oral Oral  Oral  SpO2: 100%  97% 99%  Weight:       Height:        Intake/Output Summary (Last 24 hours) at 03/20/2020 7048 Last data filed at 03/20/2020  9169 Gross per 24 hour  Intake 820 ml  Output 1103 ml  Net -283 ml   Last 3 Weights 03/19/2020 03/18/2020 03-22-20  Weight (lbs) 155 lb 3.3 oz 154 lb 5.2 oz 146 lb 13.2 oz  Weight (kg) 70.4 kg 70 kg 66.6 kg     Body mass index is 26.64 kg/m.  General:   in no acute distress HEENT: normal Neck: no JVD Cardiac:  Irregular, tachycardic no murmur  Lungs:  clear to auscultation bilaterally, no wheezing, rhonchi or rales  Abd: soft Ext: no edema Musculoskeletal:  Bruising on BLE Skin: warm and dry  Neuro:   no focal abnormalities noted Psych:  Normal affect   EKG:  The EKG was personally reviewed and demonstrates:  AF, rate 76, PVC, nonspecific intraventricular conduction delay Telemetry:  Telemetry was personally reviewed and demonstrates:  AF 90s-120s  Relevant CV Studies: Echo 09/09/19: 1. Left ventricular ejection fraction, by estimation, is 55 to 60%. The  left ventricle has normal function. The left ventricle has no regional  wall motion abnormalities. Left ventricular diastolic function could not  be evaluated. Left ventricular  diastolic function could not be evaluated.  2. Right ventricular systolic function is normal. The right ventricular  size is mildly enlarged. There is mildly elevated pulmonary artery  systolic pressure. The estimated right ventricular systolic pressure is  45.0 mmHg.  3. Left atrial size was severely dilated.  4. The mitral valve is grossly normal. Mild mitral valve regurgitation.  No evidence of mitral stenosis.  5. Tricuspid valve regurgitation is mild to moderate.  6. The aortic valve is tricuspid. Aortic valve regurgitation is not  visualized. Mild to moderate aortic valve sclerosis/calcification is  present, without any evidence of aortic stenosis.  7. Aneurysm of the ascending aorta, measuring 46 mm.  8. The inferior vena cava is  normal in size with greater than 50%  respiratory variability, suggesting right atrial pressure of 3 mmHg.   Laboratory Data:  High Sensitivity Troponin:  No results for input(s): TROPONINIHS in the last 720 hours.   Chemistry Recent Labs  Lab 03/18/20 0527 03/19/20 0431 03/20/20 0507  NA 140 138 136  K 3.9 4.0 3.8  CL 111 108 106  CO2 21* 20* 21*  GLUCOSE 70 66* 80  BUN 15 14 12   CREATININE 0.92 0.87 0.87  CALCIUM 9.0 8.8* 8.6*  GFRNONAA 56* >60 >60  GFRAA >60 >60 >60  ANIONGAP 8 10 9     No results for input(s): PROT, ALBUMIN, AST, ALT, ALKPHOS, BILITOT in the last 168 hours. Hematology Recent Labs  Lab 03/18/20 0527 03/18/20 0527 03/19/20 0431 03/19/20 1714 03/20/20 0507  WBC 11.4*  --  9.3  --  6.0  RBC 3.13*  --  3.13*  --  3.23*  HGB 9.2*   < > 9.3* 9.4* 9.4*  HCT 29.3*   < > 29.3* 29.8* 30.2*  MCV 93.6  --  93.6  --  93.5  MCH 29.4  --  29.7  --  29.1  MCHC 31.4  --  31.7  --  31.1  RDW 17.5*  --  17.2*  --  17.4*  PLT 210  --  233  --  237   < > = values in this interval not displayed.   BNPNo results for input(s): BNP, PROBNP in the last 168 hours.  DDimer No results for input(s): DDIMER in the last 168 hours.   Radiology/Studies:  US RENAL  Result Date: Mar 22, 2020 CLINICAL DATA:  86 year old female with acute kidney injury. EXAM: RENAL / URINARY TRACT ULTRASOUND COMPLETE COMPARISON:  04/22/2016 ultrasound FINDINGS: Right Kidney: Renal measurements: 10.8 x 5.3 x 5.4 cm = volume: 160 mL. Moderate cortical atrophy noted. A 1.9 cm UPPER pole cyst is present. No solid mass or hydronephrosis identified. Echogenicity is within normal limits. Left Kidney: Renal measurements: 11.7 x 6.1 x 5.5 cm = volume: 202 mL. Moderate cortical atrophy noted. Echogenicity within normal limits. No mass or hydronephrosis visualized. Bladder: Appears normal for degree of bladder distention. Other: None. IMPRESSION: 1. Moderate bilateral renal cortical atrophy. 2. No evidence of  hydronephrosis. Electronically Signed   By: Margarette Canada M.D.   On: 03/17/2020 07:59   DG HIP UNILAT WITH PELVIS 2-3 VIEWS RIGHT  Result Date: 03/18/2020 CLINICAL DATA:  Acute right hip pain after surgery. EXAM: DG HIP (WITH OR WITHOUT PELVIS) 2-3V RIGHT COMPARISON:  None. FINDINGS: There is no evidence of hip fracture or dislocation. No significant joint space narrowing is noted. Mild osteophyte formation is noted involving the acetabulum. IMPRESSION: Mild degenerative changes are noted. No acute abnormality is noted. Electronically Signed   By: Marijo Conception M.D.   On: 03/18/2020 11:34   {   Assessment and Plan:   Permanent atrial fibrillation: CHA2DS2-VASc score 7 (CHF, hypertension, age x2, stroke, female).  On labetalol and diltiazem for rate control as outpatient -Rates elevated, labetalol restarted but at 200 mg BID, will increase to home 300 mg qam/450 mg qhs.  Can add back home diltiazem if rates still elevated on labetalol -Restart Eliquis once okay from surgical perspective.  Per note from surgery, plan to restart tomorrow if no further bleeding.  If further delay in starting anticoagulation, would favor starting heparin drip  Anemia: Underwent hemorrhoidectomy on 9/1, presented with hemoglobin 7.6 on 9/2 (down from 10.2 on preop labs on 9/1).  Transfused 1 unit PRBCs, hemoglobin currently stable at 9.4.  Started heparin drip on admission, was changed to Eliquis on 9/4 but had episode of BRBPR on 9/4.  Eliquis currently on hold. -Agree with plan per surgery, restart Eliquis tomorrow if no further bleeding.  If surgery recommends longer hold of Eliquis, would favor trial of heparin drip to ensure tolerating anticoagulation prior to restarting Eliquis  AKI: Resolved.  Creatinine 1.8 on admission, resolved with IV fluids, now down to 0.86  Chronic diastolic heart failure: EF 55 to 60% on echo 09/09/2019.  Hypovolemic on presentation with AKI as above, resolved with IV fluids.  Currently  appears euvolemic.  Hypertension: Hypotensive on admission, home BP meds (diltiazem, labetalol, losartan) were held.  Hypotension likely 2/2 hypovolemia, has resolved with IV fluids.  Labetalol has been restarted.    For questions or updates, please contact [place] Please consult [email or web address] for contact info under    Signed, Donato Heinz, MD  03/20/2020 8:17 AM

## 2020-03-20 NOTE — Progress Notes (Signed)
Pt called out of room and stated she, "just didn't feel well" and "This is the worst I've felt since being here". Vital signs stable. CBG 109. Pt reports poor PO intake. Cranberry juice and water given to patient to drink and patient was repositioned. MD made aware. New orders placed. Will continue to monitor.

## 2020-03-21 ENCOUNTER — Encounter (HOSPITAL_COMMUNITY): Payer: Self-pay | Admitting: Internal Medicine

## 2020-03-21 LAB — HEMOGLOBIN AND HEMATOCRIT, BLOOD
HCT: 27.1 % — ABNORMAL LOW (ref 36.0–46.0)
HCT: 29 % — ABNORMAL LOW (ref 36.0–46.0)
Hemoglobin: 8.8 g/dL — ABNORMAL LOW (ref 12.0–15.0)
Hemoglobin: 9 g/dL — ABNORMAL LOW (ref 12.0–15.0)

## 2020-03-21 MED ORDER — HEPARIN (PORCINE) 25000 UT/250ML-% IV SOLN
900.0000 [IU]/h | INTRAVENOUS | Status: DC
  Administered 2020-03-21: 900 [IU]/h via INTRAVENOUS
  Filled 2020-03-21: qty 250

## 2020-03-21 MED ORDER — PSYLLIUM 95 % PO PACK
1.0000 | PACK | Freq: Every day | ORAL | Status: DC
  Administered 2020-03-21 – 2020-03-24 (×3): 1 via ORAL
  Filled 2020-03-21 (×4): qty 1

## 2020-03-21 MED ORDER — HYDROCORTISONE (PERIANAL) 2.5 % EX CREA
TOPICAL_CREAM | Freq: Two times a day (BID) | CUTANEOUS | Status: DC
  Administered 2020-03-23: 1 via RECTAL
  Filled 2020-03-21 (×2): qty 28.35

## 2020-03-21 NOTE — Progress Notes (Signed)
Progress Note  Patient Name: [name] [name] Date of Encounter: 03/21/2020  Primary Cardiologist: Virl Axe, MD   Subjective   Feels better today. Worried about being off of AC. Seen with husband Deidre Ala present.   Inpatient Medications    Scheduled Meds: . feeding supplement (ENSURE ENLIVE)  237 mL Oral BID BM  . hydrocortisone   Rectal BID  . labetalol  300 mg Oral q morning - 10a  . labetalol  450 mg Oral QHS  . levothyroxine  25 mcg Oral Q0600  . psyllium  1 packet Oral Daily  . sodium chloride flush  3 mL Intravenous Q12H  . sulfamethoxazole-trimethoprim  1 tablet Oral Q12H   Continuous Infusions:  PRN Meds: acetaminophen **OR** acetaminophen, hydrOXYzine, lidocaine, ondansetron **OR** ondansetron (ZOFRAN) IV, polyvinyl alcohol   Vital Signs    Vitals:   03/21/20 0821 03/21/20 0826 03/21/20 1210 03/21/20 1306  BP:   123/65 (!) 112/53  Pulse: 79 (!) 106 100 88  Resp:    (!) 22  Temp:    98.5 F (36.9 C)  TempSrc:    Oral  SpO2:   98% 98%  Weight:      Height:        Intake/Output Summary (Last 24 hours) at 03/21/2020 1315 Last data filed at 03/20/2020 2243 Gross per 24 hour  Intake 720 ml  Output 400 ml  Net 320 ml   Filed Weights   03/18/20 0500 03/19/20 0557 03/21/20 0541  Weight: 70 kg 70.4 kg 69 kg    Telemetry    afib rates 80-90 - Personally Reviewed  ECG    afib rate 76 anteroseptal infarct pattern - Personally Reviewed  Physical Exam   GEN: No acute distress.   Neck: No JVD Cardiac: irregular rhythm, normal rate, no murmurs, rubs, or gallops.  Respiratory: Clear to auscultation bilaterally. GI: Soft, nontender, non-distended  MS: trace ankle edema; No deformity. Neuro:  Nonfocal  Psych: Normal affect   Labs    Chemistry Recent Labs  Lab 03/18/20 0527 03/19/20 0431 03/20/20 0507  NA 140 138 136  K 3.9 4.0 3.8  CL 111 108 106  CO2 21* 20* 21*  GLUCOSE 70 66* 80  BUN 15 14 12   CREATININE 0.92 0.87 0.87  CALCIUM 9.0 8.8*  8.6*  GFRNONAA 56* >60 >60  GFRAA >60 >60 >60  ANIONGAP 8 10 9      Hematology Recent Labs  Lab 03/18/20 0527 03/18/20 0527 03/19/20 0431 03/19/20 1714 03/20/20 0507 03/20/20 1713 03/21/20 0511  WBC 11.4*  --  9.3  --  6.0  --   --   RBC 3.13*  --  3.13*  --  3.23*  --   --   HGB 9.2*   < > 9.3*   < > 9.4* 9.0* 8.8*  HCT 29.3*   < > 29.3*   < > 30.2* 27.7* 27.1*  MCV 93.6  --  93.6  --  93.5  --   --   MCH 29.4  --  29.7  --  29.1  --   --   MCHC 31.4  --  31.7  --  31.1  --   --   RDW 17.5*  --  17.2*  --  17.4*  --   --   PLT 210  --  233  --  237  --   --    < > = values in this interval not displayed.    Cardiac EnzymesNo results for  input(s): TROPONINI in the last 168 hours. No results for input(s): TROPIPOC in the last 168 hours.   BNPNo results for input(s): BNP, PROBNP in the last 168 hours.   DDimer No results for input(s): DDIMER in the last 168 hours.   Radiology    No results found.  Cardiac Studies  Echo  1. Left ventricular ejection fraction, by estimation, is 55 to 60%. The  left ventricle has normal function. The left ventricle has no regional  wall motion abnormalities. Left ventricular diastolic function could not  be evaluated. Left ventricular  diastolic function could not be evaluated.  2. Right ventricular systolic function is normal. The right ventricular  size is mildly enlarged. There is mildly elevated pulmonary artery  systolic pressure. The estimated right ventricular systolic pressure is  24.5 mmHg.  3. Left atrial size was severely dilated.  4. The mitral valve is grossly normal. Mild mitral valve regurgitation.  No evidence of mitral stenosis.  5. Tricuspid valve regurgitation is mild to moderate.  6. The aortic valve is tricuspid. Aortic valve regurgitation is not  visualized. Mild to moderate aortic valve sclerosis/calcification is  present, without any evidence of aortic stenosis.  7. Aneurysm of the ascending aorta,  measuring 46 mm.  8. The inferior vena cava is normal in size with greater than 50%  respiratory variability, suggesting right atrial pressure of 3 mmHg.   Comparison(s): A prior study was performed on 03/28/2015. Prior images  reviewed side by side. Changes from prior study are noted. EF unchanged.  Aorta up to 46 mm from 44 mm in 2016. Afib now present.   Patient Profile     [name] [name] is a 86 y.o. female with a hx of permanent atrial fibrillation on Eliquis, sick sinus syndrome status post PPM, chronic diastolic heart failure (EF 55 to 60%), CVA, nonobstructive CAD, hypertension, hypothyroidism who is being seen today for the evaluation of atrial fibrillationt at the request of Dr Erlinda Hong.  Assessment & Plan   Principal Problem:   AKI (acute kidney injury) ([place]) Active Problems:   Essential hypertension   Atrial fibrillation (HCC)   Chronic diastolic heart failure (HCC)   Hypothyroidism   Symptomatic anemia   Hypokalemia   Permanent Afib - rate controlled on labetalol 300 mg AM/450 mg PM. AC on hold per surgery- restart when stable from surgery standpoint. Consider IV heparin initially if concerns for recurrent bleeding are present. CHADS2VASC is high, 7, CHF, hypertension, age x2, stroke, female.    Anemia - s/p hemorrhoidectomy 9/1. Recurrent bleeding afterward requiring transfusion. Consider IV heparin to ensure patient can tolerate AC prior to transition back to eliquis.   Cr - AKI resolved.   Chronic diastolic HF - euvolemic, no SOB.   Hypotension/hypertension - BP overall stable, continue labetalol for rate control.       For questions or updates, please contact [place] Please consult [email or web address] for contact info under        Signed, Elouise Munroe, MD  03/21/2020, 1:15 PM

## 2020-03-21 NOTE — Progress Notes (Signed)
Occupational Therapy Treatment Patient Details Name: [name] [name] MRN: [id number] DOB: 11-Oct-1930 Today's Date: 03/21/2020    History of present illness 86 y.o. female with medical history significant for permanent atrial fibrillation on Eliquis, SSS s/p PPM, chronic diastolic CHF (EF 23-76% by TTE 09/09/2019), history of left parietal ICH/CVA, nonobstructive CAD, hypothyroidism, HTN, and HLD . Patient was recently admitted from 02/21/2020-02/25/2020 for acute lower GI bleeding secondary to internal hemorrhoids  She underwent outpatient hemorrhoidectomy (03/15/2020) and was discharged home. returned to ER for generalized weakness.   OT comments  Pt needed max encouragement to get OOB and to chair.    Follow Up Recommendations  Home health OT    Equipment Recommendations  None recommended by OT       Precautions / Restrictions Precautions Precautions: Fall       Mobility Bed Mobility Overal bed mobility: Needs Assistance Bed Mobility: Supine to Sit     Supine to sit: Min assist     General bed mobility comments: Increased time and effortful.  Transfers Overall transfer level: Needs assistance Equipment used: Rolling walker (2 wheeled) Transfers: Sit to/from [facility] Sit to Stand: Min assist;Mod assist Stand pivot transfers: Min assist            Balance Overall balance assessment: Needs assistance         Standing balance support: Bilateral upper extremity supported Standing balance-Leahy Scale: Poor                             ADL either performed or assessed with clinical judgement   ADL Overall ADL's : Needs assistance/impaired Eating/Feeding: Independent;Sitting   Grooming: Set up;Sitting   Upper Body Bathing: Set up;Sitting   Lower Body Bathing: Maximal assistance;Sit to/from stand;Cueing for safety;Cueing for sequencing   Upper Body Dressing : Set up;Sitting   Lower Body Dressing: Maximal assistance;Sit to/from  stand   Toilet Transfer: RW;BSC;Minimal assistance;Cueing for sequencing;Cueing for safety   Toileting- Clothing Manipulation and Hygiene: Moderate assistance;Sit to/from stand;Cueing for safety;Cueing for sequencing;Cueing for compensatory techniques         General ADL Comments: pt needed encouragment to get OOB.  Needed much encouragement. Pt verbalized wanting to go home but stating family wants pt to go to rehab     Vision Baseline Vision/History: Wears glasses Wears Glasses: At all times Patient Visual Report: No change from baseline            Cognition Arousal/Alertness: Awake/alert Behavior During Therapy: WFL for tasks assessed/performed Overall Cognitive Status: Within Functional Limits for tasks assessed                                                     Pertinent Vitals/ Pain       Pain Score: 3  Faces Pain Scale: Hurts little more Pain Location: RLE Pain Descriptors / Indicators: Discomfort;Sore Pain Intervention(s): Limited activity within patient's tolerance         Frequency  Min 2X/week        Progress Toward Goals  OT Goals(current goals can now be found in the care plan section)  Progress towards OT goals: Progressing toward goals     Plan Discharge plan remains appropriate       AM-PAC OT "6 Clicks" Daily Activity  Outcome Measure   Help from another person eating meals?: None Help from another person taking care of personal grooming?: A Little Help from another person toileting, which includes using toliet, bedpan, or urinal?: A Lot Help from another person bathing (including washing, rinsing, drying)?: A Lot Help from another person to put on and taking off regular upper body clothing?: A Little Help from another person to put on and taking off regular lower body clothing?: A Lot 6 Click Score: 16    End of Session Equipment Utilized During Treatment: Rolling walker  OT Visit Diagnosis: History of  falling (Z91.81);Unsteadiness on feet (R26.81) Pain - part of body:  (rectum)   Activity Tolerance Patient tolerated treatment well   Patient Left with call bell/phone within reach;in chair   Nurse Communication Mobility status        Time: 6168-3729 OT Time Calculation (min): 28 min  Charges: OT General Charges $OT Visit: 1 Visit OT Treatments $Self Care/Home Management : 23-37 mins  Kari Baars, [place] [phone] Office- [phone]      Delvecchio Madole, Edwena Felty D 03/21/2020, 5:17 PM

## 2020-03-21 NOTE — Evaluation (Signed)
Physical Therapy Evaluation Patient Details Name: [name] [name] MRN: [id number] DOB: [birth year] Today's Date: 03/21/2020   History of Present Illness  86 y.o. female with medical history significant for permanent atrial fibrillation on Eliquis, SSS s/p PPM, chronic diastolic CHF (EF 56-38% by TTE 09/09/2019), history of left parietal ICH/CVA, nonobstructive CAD, hypothyroidism, HTN, and HLD . Patient was recently admitted from 02/21/2020-02/25/2020 for acute lower GI bleeding secondary to internal hemorrhoids  She underwent outpatient hemorrhoidectomy (03/15/2020) and was discharged home. returned to ER for generalized weakness.  Clinical Impression  Pt reports feeling better than yesterday but she remains weak. Pt was only able to walk a short distance due to weakness/feeling faint. BP WNL. Pt continues to present with general weakness, decreased activity tolerance, and impaired gait and balance. She exhibits risk for falls when ambulating. Pt seemed surprised by how weak she is compared to her baseline. She reports that she has not been eating very much. Discussed d/c plan again on today with pt and husband-pt is now agreeable to SNF placement. Husband feels she needs to go to rehab as well. PT recommendations have been updated to [facility] SN[facility]. Made RN and CSW aware.     Follow Up Recommendations SNF    Equipment Recommendations  None recommended by PT    Recommendations for Other Services       Precautions / Restrictions Precautions Precautions: Fall Restrictions Weight Bearing Restrictions: No      Mobility  Bed Mobility Overal bed mobility: Needs Assistance Bed Mobility: Supine to Sit     Supine to sit: Supervision;HOB elevated     General bed mobility comments: Increased time and effortful.  Transfers Overall transfer level: Needs assistance Equipment used: Rolling walker (2 wheeled) Transfers: Sit to/from Stand Sit to Stand: Min assist;Mod assist         General transfer  comment: x2. Mod assist from standard chair with no arms. Min assist from elevated bed. VCs safety, technique, sequence.  Ambulation/Gait Ambulation/Gait assistance: Min assist Gait Distance (Feet): 45 Feet (45'x1; 15'x1) Assistive device: Rolling walker (2 wheeled) Gait Pattern/deviations: Step-through pattern;Decreased stride length     General Gait Details: walked ~15 feet then had to sit immediately due to weakness/"feeling faint"-chair brought up for pt to sit. BP WNL. After rest, pt was able to walk ~45 feet with chair following.  Stairs            Wheelchair Mobility    Modified Rankin (Stroke Patients Only)       Balance Overall balance assessment: Needs assistance         Standing balance support: Bilateral upper extremity supported Standing balance-Leahy Scale: Poor                               Pertinent Vitals/Pain Pain Assessment: 0-10 Pain Score: 4  Pain Location: bil anterior legs Pain Descriptors / Indicators: Discomfort;Sore Pain Intervention(s): Monitored during session    Home Living                        Prior Function                 Hand Dominance        Extremity/Trunk Assessment                Communication      Cognition Arousal/Alertness: Awake/alert Behavior During Therapy: WFL for tasks assessed/performed Overall Cognitive Status:  Within Functional Limits for tasks assessed                                        General Comments      Exercises     Assessment/Plan    PT Assessment Patient needs continued PT services  PT Problem List Decreased activity tolerance;Decreased balance;Decreased mobility;Decreased knowledge of use of DME;Pain       PT Treatment Interventions      PT Goals (Current goals can be found in the Care Plan section)  Acute Rehab PT Goals Patient Stated Goal: Less pain. regain plof PT Goal Formulation: With patient/family Time For Goal  Achievement: 04/04/20 Potential to Achieve Goals: Good    Frequency Min 3X/week   Barriers to discharge        Co-evaluation               AM-PAC PT "6 Clicks" Mobility  Outcome Measure Help needed turning from your back to your side while in a flat bed without using bedrails?: A Little Help needed moving from lying on your back to sitting on the side of a flat bed without using bedrails?: A Little Help needed moving to and from a bed to a chair (including a wheelchair)?: A Little Help needed standing up from a chair using your arms (e.g., wheelchair or bedside chair)?: A Little Help needed to walk in hospital room?: A Little Help needed climbing 3-5 steps with a railing? : A Lot 6 Click Score: 17    End of Session Equipment Utilized During Treatment: Gait belt Activity Tolerance: Patient limited by fatigue Patient left: in bed;with call bell/phone within reach;with bed alarm set;with family/visitor present   PT Visit Diagnosis: Muscle weakness (generalized) (M62.81);Difficulty in walking, not elsewhere classified (R26.2) Pain - Right/Left:  (bil) Pain - part of body: Leg    Time: 4742-5956 PT Time Calculation (min) (ACUTE ONLY): 37 min   Charges:     PT Treatments $Gait Training: 23-37 mins           Doreatha Massed, PT Acute Rehabilitation  Office: [phone] Pager: [phone]

## 2020-03-21 NOTE — Progress Notes (Signed)
CC:  Subjective: 86 year old female who was hospitalized 8/9-8/13/2021 for rectal bleeding secondary to hemorrhoids, on chronic anticoagulation for atrial fibrillation.  S/p outpatient hemorrhoidectomy 03/15/2020.  Readmitted 03/16/2020, with weakness, and H/H 7.6/24.  Patient transfused 1 unit of packed RBCs 03/16/2020.  H/H 8.8/27.1 currently  She reports some blood occasionally on the bed covers, but no significant bleeding.  She has not had a Sitz bath since she has been here.  She is having very loose stools, and is able to get up and walk now.    Objective: Vital signs in last 24 hours: Temp:  [98.2 F (36.8 C)-99.1 F (37.3 C)] 99.1 F (37.3 C) (09/07 0541) Pulse Rate:  [76-124] 76 (09/07 0541) Resp:  [16-20] 20 (09/07 0541) BP: (112-146)/(58-92) 112/58 (09/07 0541) SpO2:  [97 %-99 %] 97 % (09/07 0541) Weight:  [69 kg] 69 kg (09/07 0541) Last BM Date: 03/19/20 . sodium chloride     720 p.o. recorded Voided x2 recorded No BM recorded CBC Latest Ref Rng & Units 03/21/2020 03/20/2020 03/20/2020  Hemoglobin 12.0 - 15.0 g/dL 8.8(L) 9.0(L) 9.4(L)  Hematocrit 36 - 46 % 27.1(L) 27.7(L) 30.2(L)  Platelets 150 - 400 K/uL - - 237    Intake/Output from previous day: 09/06 0701 - 09/07 0700 In: 720 [P.O.:720] Out: 400 [Urine:400] Intake/Output this shift: No intake/output data recorded.  General appearance: alert, cooperative and no distress Rectal: Her sites look fine, no blood on external exam.  Her skin is moist around the rectum and a bit irritated.    Lab Results:  Recent Labs    03/19/20 0431 03/19/20 1714 03/20/20 0507 03/20/20 0507 03/20/20 1713 03/21/20 0511  WBC 9.3  --  6.0  --   --   --   HGB 9.3*   < > 9.4*   < > 9.0* 8.8*  HCT 29.3*   < > 30.2*   < > 27.7* 27.1*  PLT 233  --  237  --   --   --    < > = values in this interval not displayed.    BMET Recent Labs    03/19/20 0431 03/20/20 0507  NA 138 136  K 4.0 3.8  CL 108 106  CO2 20* 21*  GLUCOSE  66* 80  BUN 14 12  CREATININE 0.87 0.87  CALCIUM 8.8* 8.6*   PT/INR No results for input(s): LABPROT, INR in the last 72 hours.  No results for input(s): AST, ALT, ALKPHOS, BILITOT, PROT, ALBUMIN in the last 168 hours.   Lipase     Component Value Date/Time   LIPASE 39 10/27/2017 1706     Medications: . feeding supplement (ENSURE ENLIVE)  237 mL Oral BID BM  . labetalol  300 mg Oral q morning - 10a  . labetalol  450 mg Oral QHS  . levothyroxine  25 mcg Oral Q0600  . sodium chloride flush  3 mL Intravenous Q12H  . sulfamethoxazole-trimethoprim  1 tablet Oral Once  . sulfamethoxazole-trimethoprim  1 tablet Oral Q12H    Assessment/Plan Hx atrial fibrillation on Eliquis  -Eliquis on hold/heparin on hold Anemia secondary to rectal bleeding   - H/H Chronic diastolic congestive heart failure Permanent transvenous pacemaker Hypothyroid Hx CVA Hx hypertension Hx hyperlipidemia Hx intracranial hemorrhage Hx NASH Hx polymyalgia rheumatica Multiple medical allergies E coli UTI  Recurrent rectal bleeding S/p 3 column hemorrhoidectomy 0/11/3974, Dr. Yetta Barre  FEN: Heart healthy diet ID: Bactrim DS 9/4>> day 4 DVT: SCDs -Eliquis/heparin on hold Follow-up: Dr.  Leighton Ruff  Plan:  From our standpoint, she can use the Sitz bath 2-3 times per day, she can use the Anusol as directed after surgery.  I ask the nurse to get some skin cream for her buttocks also to help with the wetness.  From our standpoint you can restart anticoagulant when you are comfortable with her H/H.  Add Metamucil for her loose stools.        LOS: 3 days    [name] [name] 03/21/2020 Please see Amion

## 2020-03-21 NOTE — Progress Notes (Addendum)
[facility] for IV heparin  Indication: hx of atrial fibrillation  Allergies  Allergen Reactions   Penicillins Anaphylaxis    Has patient had a PCN reaction causing immediate rash, facial/tongue/throat swelling, SOB or lightheadedness with hypotension: Yes Has patient had a PCN reaction causing severe rash involving mucus membranes or skin necrosis: No Has patient had a PCN reaction that required hospitalization No Has patient had a PCN reaction occurring within the last 10 years: No If all of the above answers are "NO", then may proceed with Cephalosporin use.    Statins Other (See Comments)    REACTION: elevated LFT's   Tylenol [Acetaminophen] Other (See Comments)    If taken with Vytorin at risk for liver damage   Adhesive [Tape] Other (See Comments)    Irritates skin   Amiodarone Other (See Comments)    unk   Quinolones Other (See Comments)    UNk   Tape Other (See Comments)    Redness, Please use "paper" tape only.   Codeine Other (See Comments)    feel funny, head is fuzzy    Patient Measurements: Height: 5' 4"  (162.6 cm) Weight: 69 kg (152 lb 3.2 oz) IBW/kg (Calculated) : 54.7 Heparin Dosing Weight: 68.6 kg  Vital Signs: Temp: 98.5 F (36.9 C) (09/07 1306) Temp Source: Oral (09/07 1306) BP: 112/53 (09/07 1306) Pulse Rate: 88 (09/07 1306)  Labs: Recent Labs    03/19/20 0431 03/19/20 1714 03/20/20 0507 03/20/20 0507 03/20/20 1713 03/20/20 1713 03/21/20 0511 03/21/20 1633  HGB 9.3*   < > 9.4*   < > 9.0*   < > 8.8* 9.0*  HCT 29.3*   < > 30.2*   < > 27.7*  --  27.1* 29.0*  PLT 233  --  237  --   --   --   --   --   CREATININE 0.87  --  0.87  --   --   --   --   --    < > = values in this interval not displayed.    Estimated Creatinine Clearance: 44.3 mL/min (by C-G formula based on SCr of 0.87 mg/dL).   Medical History: Past Medical History:  Diagnosis Date   Anemia due to chronic blood loss 12/2019    rectal bleeding   Bilateral lower extremity edema    CAD (coronary artery disease) cardiologist-- dr Caryl Comes   a. LHC 10/2017 30% RCA otherwise OK.  (nonobstructive cad)   Chronic diastolic CHF (congestive heart failure) ([place])    followed by dr Caryl Comes   Chronic venous insufficiency    Hemorrhoids    History of adenomatous polyp of colon    History of hemorrhagic cerebrovascular accident (CVA) without residual deficits 03/25/2015   (previous seen by neuologist--- dr Erlinda Hong   ICH left posterior parietal in setting  HTN/ and pt taking coumadin;   per pt no residual   History of kidney stones    History of recurrent UTIs    HLD (hyperlipidemia)    HTN (hypertension)    followed by dr Caryl Comes and pcp   Hypothyroidism    followed by pcp   NASH (nonalcoholic steatohepatitis)    hx medication induced hepatitis (vytorin per pt)   OA (osteoarthritis)    Pacemaker-[employer] 07/29/2011  implanted   followed by dr Caryl Comes   Permanent atrial fibrillation [facility])    cardiology--- dr Caryl Comes--- hx several DCCVs   PMR (polymyalgia rheumatica) ([place])    03-14-2020  per pt  has not had any issues in several years   Rectal bleeding    sinus node dysfunction//post termination pauses    SSS (sick sinus syndrome) ([facility])    s/p  PPM 07-29-2011   Thoracic aortic aneurysm (TAA) ([place])    a. followed by Dr. Servando Snare.    Assessment: 48 y/oF on Apixaban PTA for atrial fibrillation s/p outpatient hemorrhoidectomy on 9/1, presented to the ED on 9/2 with generalized weakness. She was found to have symptomatic anemia. Anticoagulation was transitioned to heparin infusion on 9/3. Was transitioned back to Apixaban on 9/4, but subsequently had another episode of bright red blood per rectum and Apixaban held again (received last dose 9/4 at 2139). General surgery recommended to hold anticoagulation for 5 days, but patient reluctant given high stroke risk. Pharmacy consulted to resume IV heparin infusion today. Hgb  low but stable at 9.    Goal of Therapy:  Heparin level 0.3-0.7 units/ml  APTT 66-102 seconds Monitor platelets by anticoagulation protocol: Yes   Plan:   Start heparin infusion at 900 units/hr (no bolus per MD request)  Heparin level and aPTT 8 hours after initiation-use aPTT for monitoring/dose titration until they correlate, then can use heparin levels for monitoring thereafter  Daily CBC, heparin level  Monitor closely for s/sx of bleeding   Lindell Spar M 03/21/2020,5:18 PM

## 2020-03-21 NOTE — Progress Notes (Addendum)
PROGRESS NOTE    [name] [name]  YYQ:[id number]  DOB: [birth year]  PCP: [name], MD Admit date:03/16/2020 Chief compliant: Weakness 86 y.o.femalewith medical history significant forpermanent atrial fibrillation on Eliquis, SSS s/p PPM, chronic diastolic CHF (EF 88-89% by TTE 09/09/2019), history of left parietal ICH/CVA, nonobstructive CAD, hypothyroidism, HTN, and HLD who presents to the ED for evaluation of generalized weakness and also complained of right upper thigh/hip pain but denied any falls or injuries. Patient was recently admitted from 02/21/2020-02/25/2020 for acute lower GI bleeding secondary to internal hemorrhoids. During admission she was transfused 1 unit PRBC. She underwent outpatient hemorrhoidectomy by general surgery, Dr. Leighton Ruff, yesterday (03/15/2020). Eliquis was held 24hours prior to the procedure.Preoperative labs were notable for hemoglobin 10.2 and creatinine 1.10 (baseline creatinine 0.8-0.9). She was dischargedtohome same day after surgery.  She had resumed her home dose of Eliquis on the morning of admission. ED Course: Afebrile,BP 94/44, pulse 70, RR 18, 94% on room air.Labs notable for hemoglobin 7.6, WBC 13.7, platelets 215,000, creatinine 1.83, sodium 134, potassium 3.2, bicarb 21, BUN 19, serum glucose 86.  She had received 800 cc normal saline by EMS in route, started on 1 unit PRBC transfusion in the ED Hospital course: Patient admitted to [facility] for further evaluation and management of AKI and anemia. Eliquis was held on admission and she was started on heparin drip.  No evidence of GI bleed on heparin, her Eliquis was restarted on 9/4.  Subsequently had another episode of bright red blood per rectum on 9/4 and Eliquis was again held.  General surgery was consulted who felt recurrent GI bleed was due to premature resumption of anticoagulation and recommended holding Eliquis for 5 days.  Seen by physical therapy and initial hospital course and recommended  SNF but since then patient has had some improvement.  Subjective:  Patient resting comfortably in no acute distress.  She did have 1 episode of hematochezia this morning again-very mild per patient and per bedside nurse.  Objective: Vitals:   03/21/20 0821 03/21/20 0826 03/21/20 1210 03/21/20 1306  BP:   123/65 (!) 112/53  Pulse: 79 (!) 106 100 88  Resp:    (!) 22  Temp:    98.5 F (36.9 C)  TempSrc:    Oral  SpO2:   98% 98%  Weight:      Height:        Intake/Output Summary (Last 24 hours) at 03/21/2020 1949 Last data filed at 03/21/2020 1850 Gross per 24 hour  Intake 361.91 ml  Output 400 ml  Net -38.09 ml   Filed Weights   03/18/20 0500 03/19/20 0557 03/21/20 0541  Weight: 70 kg 70.4 kg 69 kg    Physical Examination:  General: Moderately built, no acute distress noted Head ENT: Atraumatic normocephalic, PERRLA, neck supple Heart: S1-S2 heard, irregular rate and rhythm, no murmurs.  No leg edema noted Lungs: Equal air entry bilaterally, no rhonchi or rales on exam, no accessory muscle use Abdomen: Bowel sounds heard, soft, nontender, nondistended. No organomegaly.  No CVA tenderness Extremities: No pedal edema.  No cyanosis or clubbing. Neurological: Awake alert oriented x3, no focal weakness or numbness, strength and sensations to crude touch intact Skin: No wounds or rashes.  Data Reviewed: I have personally reviewed following labs and imaging studies  CBC: Recent Labs  Lab 03/16/20 1706 03/16/20 1706 03/17/20 1694 03/17/20 5038 03/18/20 0527 03/18/20 0527 03/19/20 0431 03/19/20 0431 03/19/20 1714 03/20/20 0507 03/20/20 1713 03/21/20 0511 03/21/20 1633  WBC  13.7*  --  11.9*  --  11.4*  --  9.3  --   --  6.0  --   --   --   NEUTROABS 11.3*  --   --   --   --   --  7.3  --   --   --   --   --   --   HGB 7.6*   < > 9.2*   < > 9.2*   < > 9.3*   < > 9.4* 9.4* 9.0* 8.8* 9.0*  HCT 24.4*   < > 28.8*   < > 29.3*   < > 29.3*   < > 29.8* 30.2* 27.7* 27.1* 29.0*    MCV 95.7  --  94.1  --  93.6  --  93.6  --   --  93.5  --   --   --   PLT 215  --  204  --  210  --  233  --   --  237  --   --   --    < > = values in this interval not displayed.   Basic Metabolic Panel: Recent Labs  Lab 03/16/20 1706 03/17/20 0623 03/18/20 0527 03/19/20 0431 03/20/20 0507  NA 134* 136 140 138 136  K 3.2* 4.0 3.9 4.0 3.8  CL 105 108 111 108 106  CO2 21* 21* 21* 20* 21*  GLUCOSE 86 92 70 66* 80  BUN 19 19 15 14 12   CREATININE 1.83* 1.29* 0.92 0.87 0.87  CALCIUM 8.8* 8.8* 9.0 8.8* 8.6*  MG 1.8  --  1.7 2.2  --    GFR: Estimated Creatinine Clearance: 44.3 mL/min (by C-G formula based on SCr of 0.87 mg/dL). Liver Function Tests: No results for input(s): AST, ALT, ALKPHOS, BILITOT, PROT, ALBUMIN in the last 168 hours. No results for input(s): LIPASE, AMYLASE in the last 168 hours. No results for input(s): AMMONIA in the last 168 hours. Coagulation Profile: No results for input(s): INR, PROTIME in the last 168 hours. Cardiac Enzymes: No results for input(s): CKTOTAL, CKMB, CKMBINDEX, TROPONINI in the last 168 hours. BNP (last 3 results) No results for input(s): PROBNP in the last 8760 hours. HbA1C: No results for input(s): HGBA1C in the last 72 hours. CBG: Recent Labs  Lab 03/20/20 1502  GLUCAP 109*   Lipid Profile: No results for input(s): CHOL, HDL, LDLCALC, TRIG, CHOLHDL, LDLDIRECT in the last 72 hours. Thyroid Function Tests: Recent Labs    03/19/20 0431  TSH 1.587   Anemia Panel: No results for input(s): VITAMINB12, FOLATE, FERRITIN, TIBC, IRON, RETICCTPCT in the last 72 hours. Sepsis Labs: No results for input(s): PROCALCITON, LATICACIDVEN in the last 168 hours.  Recent Results (from the past 240 hour(s))  SARS CORONAVIRUS 2 (TAT 6-24 HRS) Nasopharyngeal Nasopharyngeal Swab     Status: None   Collection Time: 03/13/20  2:57 PM   Specimen: Nasopharyngeal Swab  Result Value Ref Range Status   SARS Coronavirus 2 NEGATIVE NEGATIVE Final     Comment: (NOTE) SARS-CoV-2 target nucleic acids are NOT DETECTED.  The SARS-CoV-2 RNA is generally detectable in upper and lower respiratory specimens during the acute phase of infection. Negative results do not preclude SARS-CoV-2 infection, do not rule out co-infections with other pathogens, and should not be used as the sole basis for treatment or other patient management decisions. Negative results must be combined with clinical observations, patient history, and epidemiological information. The expected result is Negative.  Fact Sheet for  Patients: SugarRoll.be  Fact Sheet for Healthcare Providers: https://www.woods-mathews.com/  This test is not yet approved or cleared by the [place] FDA and  has been authorized for detection and/or diagnosis of SARS-CoV-2 by FDA under an Emergency Use Authorization (EUA). This EUA will remain  in effect (meaning this test can be used) for the duration of the COVID-19 declaration under Se ction 564(b)(1) of the Act, 21 U.S.C. section 360bbb-3(b)(1), unless the authorization is terminated or revoked sooner.  Performed at [facility], [place] [address], [place], [place] [address]   SARS Coronavirus 2 by RT PCR (hospital order, performed in [facility] hospital lab) Nasopharyngeal Nasopharyngeal Swab     Status: None   Collection Time: 03/16/20  8:42 PM   Specimen: Nasopharyngeal Swab  Result Value Ref Range Status   SARS Coronavirus 2 NEGATIVE NEGATIVE Final    Comment: (NOTE) SARS-CoV-2 target nucleic acids are NOT DETECTED.  The SARS-CoV-2 RNA is generally detectable in upper and lower respiratory specimens during the acute phase of infection. The lowest concentration of SARS-CoV-2 viral copies this assay can detect is 250 copies / mL. A negative result does not preclude SARS-CoV-2 infection and should not be used as the sole basis for treatment or other patient management decisions.  A  negative result may occur with improper specimen collection / handling, submission of specimen other than nasopharyngeal swab, presence of viral mutation(s) within the areas targeted by this assay, and inadequate number of viral copies (<250 copies / mL). A negative result must be combined with clinical observations, patient history, and epidemiological information.  Fact Sheet for Patients:   StrictlyIdeas.no  Fact Sheet for Healthcare Providers: BankingDealers.co.za  This test is not yet approved or  cleared by the [place] FDA and has been authorized for detection and/or diagnosis of SARS-CoV-2 by FDA under an Emergency Use Authorization (EUA).  This EUA will remain in effect (meaning this test can be used) for the duration of the COVID-19 declaration under Section 564(b)(1) of the Act, 21 U.S.C. section 360bbb-3(b)(1), unless the authorization is terminated or revoked sooner.  Performed at [facility], [place] [address], [place], [place] [address]   Culture, Urine     Status: Abnormal   Collection Time: 03/17/20 11:25 AM   Specimen: Urine, Clean Catch  Result Value Ref Range Status   Specimen Description   Final    URINE, CLEAN CATCH Performed at [facility], [place] [address] [place], [place] [address]    Special Requests   Final    NONE Performed at [facility], [place] [address] [place],  [address]    Culture >=100,000 COLONIES/mL ESCHERICHIA COLI (A)  Final   Report Status 03/19/2020 FINAL  Final   Organism ID, Bacteria ESCHERICHIA COLI (A)  Final      Susceptibility   Escherichia coli - MIC*    AMPICILLIN <=2 SENSITIVE Sensitive     CEFAZOLIN <=4 SENSITIVE Sensitive     CEFTRIAXONE <=0.25 SENSITIVE Sensitive     CIPROFLOXACIN <=0.25 SENSITIVE Sensitive     GENTAMICIN <=1 SENSITIVE Sensitive     IMIPENEM <=0.25 SENSITIVE Sensitive     NITROFURANTOIN  <=16 SENSITIVE Sensitive     TRIMETH/SULFA <=20 SENSITIVE Sensitive     AMPICILLIN/SULBACTAM <=2 SENSITIVE Sensitive     PIP/TAZO <=4 SENSITIVE Sensitive     * >=100,000 COLONIES/mL ESCHERICHIA COLI      Radiology Studies: No results found.    Scheduled Meds: . feeding  supplement (ENSURE ENLIVE)  237 mL Oral BID BM  . hydrocortisone   Rectal BID  . labetalol  300 mg Oral q morning - 10a  . labetalol  450 mg Oral QHS  . levothyroxine  25 mcg Oral Q0600  . psyllium  1 packet Oral Daily  . sodium chloride flush  3 mL Intravenous Q12H   Continuous Infusions: . heparin 900 Units/hr (03/21/20 1837)      Assessment/Plan:  1.  Recurrent rectal bleeding with symptomatic anemia: Patient had hemoglobin 7.6 on presentation, improved to 9.2 with 1 unit PRBC transfusion.  Eliquis held on admission and patient monitored on heparin drip due to high stroke risk.  She did well on heparin drip without GI bleed but had recurrence when resumed on Eliquis 9/4 as well as 9/5.  Seen by general surgery yesterday and recommended to hold anticoagulation for 5 days.  Patient reluctant given high stroke risk, seen by Dr. Ninfa Linden with GS today and given no further recurrence overnight (albeit a small amount this morning), feel okay to resume in the morning while weighing in cardiac/stroke risks.  Since patient had recurrent albeit small amount of hematochezia with serial hemoglobin showing downtrend 9.4-> 8.8 today, will initiate IV heparin and monitor for 24 hours before transitioning to Eliquis.  Pharmacy consult placed.  2.  AKI on CKD stage IIIa: Creatinine elevated at 1.83 on presentation likely due to bleeding/hypotension/dehydration and now normalized.  No evidence of hydronephrosis and moderate bilateral renal cortical atrophy on renal ultrasound.    3.  E. coli UTI: Being treated with Bactrim (allergic to Cipro/penicillin).  ACE inhibitor was held.  Follow-up final ID and sensitivity, titrate as  needed.  4.  Chronic atrial fibrillation, sick sinus syndrome status post pacemaker: Patient has high CHA2DS2-VASc 2 score of 7.  Calcium channel blockers and labetalol held on admission due to hypotension.  Now resumed in cardiology adjusted labetalol dosage back to home dose.  Cardiology also recommends resuming anticoagulation with Eliquis if no further bleeding and cleared by surgery in a.m.  If still concerned for recurrent bleeding, recommend IV heparin and monitoring.  5.  Rectal hemorrhoids: Patient underwent hemorrhoidectomy on 9/1, hemoglobin preoperatively was 10.2.  Now dropped as discussed above and received PRBC transfusion in the ED.  Anticoagulation held last few days and being resumed as discussed above  6.  Chronic diastolic CHF: Hypovolemic/hypotensive on presentation with AKI requiring IV hydration.  Now renal function normalized and off IV fluids.  Echo in February showed preserved EF.  7.  History of hypertension with hypovolemic hypotension: Present on admission.  Home medications including diltiazem, labetalol and losartan were held on admission now resumed on labetalol home dose.  Losartan remains on hold.  Check orthostatics.  8. Hypothyroidism: Resume home medications  DVT prophylaxis: SCDs, resume anticoagulation hopefully in a.m. Code Status: DNR Family / Patient Communication: Discussed with patient and husband bedside Disposition Plan:   Status is: Inpatient  Remains inpatient appropriate because:Inpatient level of care appropriate due to severity of illness   Dispo: The patient is from: Home              Anticipated d/c is to: home with home health PT/24-hour supervision per PT recommendations today              Anticipated d/c date is: 1- 2 days              Patient currently is not medically stable to d/c.  Time spent: 25 mins     >50% time spent in discussions with care team and coordination of care.    Guilford Shi, MD [facility] Pager in [place]  If 7PM-7AM, please contact night-coverage [email or web address] 03/21/2020, 7:49 PM

## 2020-03-21 NOTE — Plan of Care (Signed)
  Problem: Clinical Measurements: Goal: Ability to maintain clinical measurements within normal limits will improve Outcome: Progressing   Problem: Coping: Goal: Level of anxiety will decrease Outcome: Completed/Met   Problem: Pain Managment: Goal: General experience of comfort will improve Outcome: Completed/Met

## 2020-03-22 ENCOUNTER — Inpatient Hospital Stay (HOSPITAL_COMMUNITY): Payer: Medicare PPO

## 2020-03-22 LAB — CBC
HCT: 28.5 % — ABNORMAL LOW (ref 36.0–46.0)
Hemoglobin: 9.1 g/dL — ABNORMAL LOW (ref 12.0–15.0)
MCH: 29.4 pg (ref 26.0–34.0)
MCHC: 31.9 g/dL (ref 30.0–36.0)
MCV: 92.2 fL (ref 80.0–100.0)
Platelets: 283 10*3/uL (ref 150–400)
RBC: 3.09 MIL/uL — ABNORMAL LOW (ref 3.87–5.11)
RDW: 17.6 % — ABNORMAL HIGH (ref 11.5–15.5)
WBC: 5.9 10*3/uL (ref 4.0–10.5)
nRBC: 0 % (ref 0.0–0.2)

## 2020-03-22 LAB — HEPARIN LEVEL (UNFRACTIONATED): Heparin Unfractionated: 0.24 IU/mL — ABNORMAL LOW (ref 0.30–0.70)

## 2020-03-22 LAB — HEMOGLOBIN AND HEMATOCRIT, BLOOD
HCT: 30 % — ABNORMAL LOW (ref 36.0–46.0)
Hemoglobin: 9.2 g/dL — ABNORMAL LOW (ref 12.0–15.0)

## 2020-03-22 LAB — APTT
aPTT: 101 seconds — ABNORMAL HIGH (ref 24–36)
aPTT: 89 seconds — ABNORMAL HIGH (ref 24–36)
aPTT: 106 seconds — ABNORMAL HIGH (ref 24–36)

## 2020-03-22 MED ORDER — LACTULOSE 10 GM/15ML PO SOLN
20.0000 g | Freq: Once | ORAL | Status: AC
  Administered 2020-03-22: 20 g via ORAL
  Filled 2020-03-22: qty 30

## 2020-03-22 MED ORDER — APIXABAN 5 MG PO TABS
5.0000 mg | ORAL_TABLET | Freq: Two times a day (BID) | ORAL | Status: DC
  Administered 2020-03-22 – 2020-03-24 (×5): 5 mg via ORAL
  Filled 2020-03-22 (×5): qty 1

## 2020-03-22 NOTE — Progress Notes (Addendum)
[place] for IV heparin  Indication: hx of atrial fibrillation  Allergies  Allergen Reactions   Penicillins Anaphylaxis    Has patient had a PCN reaction causing immediate rash, facial/tongue/throat swelling, SOB or lightheadedness with hypotension: Yes Has patient had a PCN reaction causing severe rash involving mucus membranes or skin necrosis: No Has patient had a PCN reaction that required hospitalization No Has patient had a PCN reaction occurring within the last 10 years: No If all of the above answers are "NO", then may proceed with Cephalosporin use.    Statins Other (See Comments)    REACTION: elevated LFT's   Tylenol [Acetaminophen] Other (See Comments)    If taken with Vytorin at risk for liver damage   Adhesive [Tape] Other (See Comments)    Irritates skin   Amiodarone Other (See Comments)    unk   Quinolones Other (See Comments)    UNk   Tape Other (See Comments)    Redness, Please use "paper" tape only.   Codeine Other (See Comments)    feel funny, head is fuzzy    Patient Measurements: Height: 5' 4"  (162.6 cm) Weight: 69 kg (152 lb 3.2 oz) IBW/kg (Calculated) : 54.7 Heparin Dosing Weight: 68.6 kg  Vital Signs: Temp: 98.4 F (36.9 C) (09/08 0420) Temp Source: Oral (09/07 2037) BP: 122/87 (09/08 0420) Pulse Rate: 96 (09/08 0420)  Labs: Recent Labs    03/20/20 0507 03/20/20 1713 03/21/20 0511 03/21/20 0511 03/21/20 1633 03/22/20 0300  HGB 9.4*   < > 8.8*   < > 9.0* 9.1*  HCT 30.2*   < > 27.1*  --  29.0* 28.5*  PLT 237  --   --   --   --  283  APTT  --   --   --   --   --  101*  HEPARINUNFRC  --   --   --   --   --  0.24*  CREATININE 0.87  --   --   --   --   --    < > = values in this interval not displayed.    Estimated Creatinine Clearance: 44.3 mL/min (by C-G formula based on SCr of 0.87 mg/dL).   Medical History: Past Medical History:  Diagnosis Date   Anemia due to chronic blood loss  12/2019   rectal bleeding   Bilateral lower extremity edema    CAD (coronary artery disease) cardiologist-- dr Caryl Comes   a. LHC 10/2017 30% RCA otherwise OK.  (nonobstructive cad)   Chronic diastolic CHF (congestive heart failure) ([place])    followed by dr Caryl Comes   Chronic venous insufficiency    Hemorrhoids    History of adenomatous polyp of colon    History of hemorrhagic cerebrovascular accident (CVA) without residual deficits 03/25/2015   (previous seen by neuologist--- dr Erlinda Hong   ICH left posterior parietal in setting  HTN/ and pt taking coumadin;   per pt no residual   History of kidney stones    History of recurrent UTIs    HLD (hyperlipidemia)    HTN (hypertension)    followed by dr Caryl Comes and pcp   Hypothyroidism    followed by pcp   NASH (nonalcoholic steatohepatitis)    hx medication induced hepatitis (vytorin per pt)   OA (osteoarthritis)    Pacemaker-[employer] 07/29/2011  implanted   followed by dr Caryl Comes   Permanent atrial fibrillation [facility])    cardiology--- dr Caryl Comes--- hx several DCCVs  PMR (polymyalgia rheumatica) ([place])    03-14-2020  per pt has not had any issues in several years   Rectal bleeding    sinus node dysfunction//post termination pauses    SSS (sick sinus syndrome) ([facility])    s/p  PPM 07-29-2011   Thoracic aortic aneurysm (TAA) ([place])    a. followed by Dr. Servando Snare.    Assessment: 27 y/oF on Apixaban PTA for atrial fibrillation s/p outpatient hemorrhoidectomy on 9/1, presented to the ED on 9/2 with generalized weakness. She was found to have symptomatic anemia. Anticoagulation was transitioned to heparin infusion on 9/3. Was transitioned back to Apixaban on 9/4, but subsequently had another episode of bright red blood per rectum and Apixaban held again (received last dose 9/4 at 2139). General surgery recommended to hold anticoagulation for 5 days, but patient reluctant given high stroke risk. Pharmacy consulted to resume IV heparin infusion  today. Hgb low but stable at 9.   Today, 03/22/20  APTT 101, therapeutic  Hgb low but stable  Plt WNL  No line or bleeding issues per RN    Goal of Therapy:  Heparin level 0.3-0.7 units/ml  APTT 66-102 seconds Monitor platelets by anticoagulation protocol: Yes   Plan:   Continue heparin infusion at 900 units/hr (no bolus per MD request)  Confirmatory  aPTT  In 8 hours    Use aPTT for monitoring/dose titration until they correlate, then can use heparin levels for monitoring thereafter  Daily CBC, heparin level  Monitor closely for s/sx of bleeding     Royetta Asal, PharmD, BCPS 03/22/2020 4:42 AM

## 2020-03-22 NOTE — Progress Notes (Signed)
PROGRESS NOTE    [name] [name]  VZD:[id number]  DOB: [birth year]  PCP: [name], MD Admit date:03/16/2020 Chief compliant: Weakness 86 y.o.femalewith medical history significant forpermanent atrial fibrillation on Eliquis, SSS s/p PPM, chronic diastolic CHF (EF 29-51% by TTE 09/09/2019), history of left parietal ICH/CVA, nonobstructive CAD, hypothyroidism, HTN, and HLD who presents to the ED for evaluation of generalized weakness and also complained of right upper thigh/hip pain but denied any falls or injuries. Patient was recently admitted from 02/21/2020-02/25/2020 for acute lower GI bleeding secondary to internal hemorrhoids. During admission she was transfused 1 unit PRBC. She underwent outpatient hemorrhoidectomy by general surgery, Dr. Leighton Ruff, yesterday (03/15/2020). Eliquis was held 24hours prior to the procedure.Preoperative labs were notable for hemoglobin 10.2 and creatinine 1.10 (baseline creatinine 0.8-0.9). She was dischargedtohome same day after surgery.  She had resumed her home dose of Eliquis on the morning of admission. ED Course: Afebrile,BP 94/44, pulse 70, RR 18, 94% on room air.Labs notable for hemoglobin 7.6, WBC 13.7, platelets 215,000, creatinine 1.83, sodium 134, potassium 3.2, bicarb 21, BUN 19, serum glucose 86.  She had received 800 cc normal saline by EMS in route, started on 1 unit PRBC transfusion in the ED Hospital course: Patient admitted to [facility] for further evaluation and management of AKI and anemia. Eliquis was held on admission and she was started on heparin drip.  No evidence of GI bleed on heparin, her Eliquis was restarted on 9/4.  Subsequently had another episode of bright red blood per rectum on 9/4 and Eliquis was again held.  General surgery was consulted who felt recurrent GI bleed was due to premature resumption of anticoagulation and recommended holding Eliquis for 5 days.  Seen by physical therapy and initial hospital course and recommended  SNF but since then patient has had some improvement.  Subjective:  Patient resting comfortably in no acute distress when seen in rounds this morning.  Per nurse no further evidence of rectal bleeding overnight and cleared by general surgery for resuming oral anticoagulation.  Husband at bedside and feels she is not strong enough to go home with home PT. Objective: Vitals:   03/22/20 0420 03/22/20 0500 03/22/20 0911 03/22/20 1419  BP: 122/87  134/79 126/65  Pulse: 96  75 98  Resp: (!) 24   18  Temp: 98.4 F (36.9 C)   98.2 F (36.8 C)  TempSrc:    Oral  SpO2: 98%   94%  Weight:  68.8 kg    Height:        Intake/Output Summary (Last 24 hours) at 03/22/2020 1821 Last data filed at 03/22/2020 1721 Gross per 24 hour  Intake 1.91 ml  Output 600 ml  Net -598.09 ml   Filed Weights   03/19/20 0557 03/21/20 0541 03/22/20 0500  Weight: 70.4 kg 69 kg 68.8 kg    Physical Examination:  General: Moderately built, no acute distress noted Head ENT: Atraumatic normocephalic, PERRLA, neck supple Heart: S1-S2 heard, irregular rate and rhythm, no murmurs.  No leg edema noted Lungs: Equal air entry bilaterally, no rhonchi or rales on exam, no accessory muscle use Abdomen: Bowel sounds heard, soft, nontender, nondistended. No organomegaly.  No CVA tenderness Extremities: No pedal edema.  No cyanosis or clubbing. Neurological: Awake alert oriented x3, no focal weakness or numbness, strength and sensations to crude touch intact Skin: No wounds or rashes.  Data Reviewed: I have personally reviewed following labs and imaging studies  CBC: Recent Labs  Lab 03/16/20 1706 03/16/20  1706 03/17/20 0923 03/17/20 3007 03/18/20 0527 03/18/20 0527 03/19/20 0431 03/19/20 1714 03/20/20 0507 03/20/20 0507 03/20/20 1713 03/21/20 0511 03/21/20 1633 03/22/20 0300 03/22/20 1641  WBC 13.7*   < > 11.9*  --  11.4*  --  9.3  --  6.0  --   --   --   --  5.9  --   NEUTROABS 11.3*  --   --   --   --   --   7.3  --   --   --   --   --   --   --   --   HGB 7.6*   < > 9.2*   < > 9.2*   < > 9.3*   < > 9.4*   < > 9.0* 8.8* 9.0* 9.1* 9.2*  HCT 24.4*   < > 28.8*   < > 29.3*   < > 29.3*   < > 30.2*   < > 27.7* 27.1* 29.0* 28.5* 30.0*  MCV 95.7   < > 94.1  --  93.6  --  93.6  --  93.5  --   --   --   --  92.2  --   PLT 215   < > 204  --  210  --  233  --  237  --   --   --   --  283  --    < > = values in this interval not displayed.   Basic Metabolic Panel: Recent Labs  Lab 03/16/20 1706 03/17/20 0623 03/18/20 0527 03/19/20 0431 03/20/20 0507  NA 134* 136 140 138 136  K 3.2* 4.0 3.9 4.0 3.8  CL 105 108 111 108 106  CO2 21* 21* 21* 20* 21*  GLUCOSE 86 92 70 66* 80  BUN 19 19 15 14 12   CREATININE 1.83* 1.29* 0.92 0.87 0.87  CALCIUM 8.8* 8.8* 9.0 8.8* 8.6*  MG 1.8  --  1.7 2.2  --    GFR: Estimated Creatinine Clearance: 44.2 mL/min (by C-G formula based on SCr of 0.87 mg/dL). Liver Function Tests: No results for input(s): AST, ALT, ALKPHOS, BILITOT, PROT, ALBUMIN in the last 168 hours. No results for input(s): LIPASE, AMYLASE in the last 168 hours. No results for input(s): AMMONIA in the last 168 hours. Coagulation Profile: No results for input(s): INR, PROTIME in the last 168 hours. Cardiac Enzymes: No results for input(s): CKTOTAL, CKMB, CKMBINDEX, TROPONINI in the last 168 hours. BNP (last 3 results) No results for input(s): PROBNP in the last 8760 hours. HbA1C: No results for input(s): HGBA1C in the last 72 hours. CBG: Recent Labs  Lab 03/20/20 1502  GLUCAP 109*   Lipid Profile: No results for input(s): CHOL, HDL, LDLCALC, TRIG, CHOLHDL, LDLDIRECT in the last 72 hours. Thyroid Function Tests: No results for input(s): TSH, T4TOTAL, FREET4, T3FREE, THYROIDAB in the last 72 hours. Anemia Panel: No results for input(s): VITAMINB12, FOLATE, FERRITIN, TIBC, IRON, RETICCTPCT in the last 72 hours. Sepsis Labs: No results for input(s): PROCALCITON, LATICACIDVEN in the last 168  hours.  Recent Results (from the past 240 hour(s))  SARS CORONAVIRUS 2 (TAT 6-24 HRS) Nasopharyngeal Nasopharyngeal Swab     Status: None   Collection Time: 03/13/20  2:57 PM   Specimen: Nasopharyngeal Swab  Result Value Ref Range Status   SARS Coronavirus 2 NEGATIVE NEGATIVE Final    Comment: (NOTE) SARS-CoV-2 target nucleic acids are NOT DETECTED.  The SARS-CoV-2 RNA is generally detectable in upper and lower  respiratory specimens during the acute phase of infection. Negative results do not preclude SARS-CoV-2 infection, do not rule out co-infections with other pathogens, and should not be used as the sole basis for treatment or other patient management decisions. Negative results must be combined with clinical observations, patient history, and epidemiological information. The expected result is Negative.  Fact Sheet for Patients: SugarRoll.be  Fact Sheet for Healthcare Providers: https://www.woods-mathews.com/  This test is not yet approved or cleared by the [place] FDA and  has been authorized for detection and/or diagnosis of SARS-CoV-2 by FDA under an Emergency Use Authorization (EUA). This EUA will remain  in effect (meaning this test can be used) for the duration of the COVID-19 declaration under Se ction 564(b)(1) of the Act, 21 U.S.C. section 360bbb-3(b)(1), unless the authorization is terminated or revoked sooner.  Performed at [facility], [place] [address] [place], [place] [address]   SARS Coronavirus 2 by RT PCR (hospital order, performed in [facility] hospital lab) Nasopharyngeal Nasopharyngeal Swab     Status: None   Collection Time: 03/16/20  8:42 PM   Specimen: Nasopharyngeal Swab  Result Value Ref Range Status   SARS Coronavirus 2 NEGATIVE NEGATIVE Final    Comment: (NOTE) SARS-CoV-2 target nucleic acids are NOT DETECTED.  The SARS-CoV-2 RNA is generally detectable in upper and lower respiratory  specimens during the acute phase of infection. The lowest concentration of SARS-CoV-2 viral copies this assay can detect is 250 copies / mL. A negative result does not preclude SARS-CoV-2 infection and should not be used as the sole basis for treatment or other patient management decisions.  A negative result may occur with improper specimen collection / handling, submission of specimen other than nasopharyngeal swab, presence of viral mutation(s) within the areas targeted by this assay, and inadequate number of viral copies (<250 copies / mL). A negative result must be combined with clinical observations, patient history, and epidemiological information.  Fact Sheet for Patients:   StrictlyIdeas.no  Fact Sheet for Healthcare Providers: BankingDealers.co.za  This test is not yet approved or  cleared by the [place] FDA and has been authorized for detection and/or diagnosis of SARS-CoV-2 by FDA under an Emergency Use Authorization (EUA).  This EUA will remain in effect (meaning this test can be used) for the duration of the COVID-19 declaration under Section 564(b)(1) of the Act, 21 U.S.C. section 360bbb-3(b)(1), unless the authorization is terminated or revoked sooner.  Performed at [facility], [place] [address] [place], [place] [address]   Culture, Urine     Status: Abnormal   Collection Time: 03/17/20 11:25 AM   Specimen: Urine, Clean Catch  Result Value Ref Range Status   Specimen Description   Final    URINE, CLEAN CATCH Performed at [facility], [place] [address] [place], [place] [address]    Special Requests   Final    NONE Performed at [facility], [place] [address] [place], [place] [address]    Culture >=100,000 COLONIES/mL ESCHERICHIA COLI (A)  Final   Report Status 03/19/2020 FINAL  Final   Organism ID, Bacteria ESCHERICHIA COLI (A)  Final      Susceptibility     Escherichia coli - MIC*    AMPICILLIN <=2 SENSITIVE Sensitive     CEFAZOLIN <=4 SENSITIVE Sensitive     CEFTRIAXONE <=0.25 SENSITIVE Sensitive     CIPROFLOXACIN <=0.25 SENSITIVE Sensitive     GENTAMICIN <=1 SENSITIVE Sensitive  IMIPENEM <=0.25 SENSITIVE Sensitive     NITROFURANTOIN <=16 SENSITIVE Sensitive     TRIMETH/SULFA <=20 SENSITIVE Sensitive     AMPICILLIN/SULBACTAM <=2 SENSITIVE Sensitive     PIP/TAZO <=4 SENSITIVE Sensitive     * >=100,000 COLONIES/mL ESCHERICHIA COLI      Radiology Studies: No results found.    Scheduled Meds:  apixaban  5 mg Oral BID   feeding supplement (ENSURE ENLIVE)  237 mL Oral BID BM   hydrocortisone   Rectal BID   labetalol  300 mg Oral q morning - 10a   labetalol  450 mg Oral QHS   levothyroxine  25 mcg Oral Q0600   psyllium  1 packet Oral Daily   sodium chloride flush  3 mL Intravenous Q12H   Continuous Infusions:     Assessment/Plan:  1.  Recurrent rectal bleeding with symptomatic anemia: Patient had hemoglobin 7.6 on presentation, improved to 9.2 with 1 unit PRBC transfusion.  Eliquis held on admission and patient monitored on heparin drip due to high stroke risk.  She did well on heparin drip without GI bleed but had recurrence when resumed on Eliquis 9/4 as well as 9/5.  Seen by general surgery yesterday and recommended to hold anticoagulation for 5 days.  Seen by general surgery on 9/6 and recommended supportive management for rectal bleeding but okayed for resumption of anticoagulation.  Since patient had recurrent albeit small amount of hematochezia with serial hemoglobin showing downtrend 9.4-> 8.8 ,   IV heparin again initiated on 9/7->cleared by general surgery this morning for transition to oral anticoagulation->started on Eliquis->reported by nurse later in the day that patient had pelvic pressure and had bloody bowel movement again.  General surgery notified and they recommended another sitz bath and to continue  current management.  Patient frustrated that she is having persistent rectal bleeding even after hemorrhoidectomy.  She may need sigmoidoscopy to identify source of bleeding if continues to have issues.  Defer management to general surgery.  Continue sitz bath, Anusol and other conservative management measures recommended by them for now.  2.  AKI on CKD stage IIIa: Creatinine elevated at 1.83 on presentation likely due to bleeding/hypotension/dehydration and now normalized.  No evidence of hydronephrosis and moderate bilateral renal cortical atrophy on renal ultrasound.    3.  E. coli UTI: S/p treatment with Bactrim (allergic to Cipro/penicillin) for 3 days.    4.  Chronic atrial fibrillation, sick sinus syndrome status post pacemaker: Patient has high CHA2DS2-VASc 2 score of 7.  Calcium channel blockers and labetalol held on admission due to hypotension.  Now resumed in cardiology adjusted labetalol dosage back to home dose.  Cardiology also recommends resuming anticoagulation with Eliquis as long as okay with GS.  Concerns and plan as discussed in #1  5.  Rectal hemorrhoids: Patient underwent hemorrhoidectomy on 9/1, hemoglobin preoperatively was 10.2.  Now dropped as discussed above and received PRBC transfusion in the ED.  Anticoagulation held last few days and being resumed as discussed above  6.  Chronic diastolic CHF: Hypovolemic/hypotensive on presentation with AKI requiring IV hydration.  Now renal function normalized and off IV fluids.  Echo in February showed preserved EF.  7.  History of hypertension with hypovolemic hypotension: Present on admission.  Home medications including diltiazem, labetalol and losartan were held on admission now resumed on labetalol home dose.  Losartan remains on hold.  Check orthostatics.  8. Hypothyroidism: Resume home medications  DVT prophylaxis: SCDs, resume anticoagulation hopefully in a.m. Code Status:  DNR Family / Patient Communication: Discussed  with patient and husband bedside. Disposition Plan:   Status is: Inpatient  Remains inpatient appropriate because:Inpatient level of care appropriate due to severity of illness   Dispo: The patient is from: Home              Anticipated d/c is to: home with home health PT/24-hour supervision per PT recommendations initially-now recommend SNF again.  Social worker informed.               Anticipated d/c date is: 1- 2 days              Patient currently is not medically stable to d/c due to recurrent rectal bleeding.     Time spent: 25 mins     >50% time spent in discussions with care team and coordination of care.    Guilford Shi, MD [facility] Pager in [place]  If 7PM-7AM, please contact night-coverage [email or web address] 03/22/2020, 6:21 PM

## 2020-03-22 NOTE — TOC Progression Note (Addendum)
Transition of Care [facility]) - Progression Note    Patient Details  Name: [name] [name] MRN: [id number] Date of Birth: 1931/01/17  Transition of Care [facility]) CM/SW Contact  Ross Ludwig, [place] Phone Number: 03/22/2020, 12:31 PM  Clinical Narrative:     CSW was informed that patient and family have decided they want to go to SNF now.  CSW spoke to patient's husband to discuss bed offers for SNF.  Patient's husband deferred decision to their daughter Ruthell Rummage, [phone].  CSW contacted patient's daughter, and explained process for short term rehab and how insurance will pay for stay. CSW presented bed offers, and she requested that [place] email choices to her so she can discuss with other family members and the husband.  CSW continuing to follow patient's progress thoughout discharge planning.  3:30pm CSW spoke to patient's daughter Ruthell Rummage, she is requesting [facility] if bed is available.  CSW spoke to [facility] admissions, and she said it will depend on availability when patient is medically ready for discharge.  CSW continuing to follow patient's progress throughout discharge planning.  CSW will need to start insurance authorization.   Expected Discharge Plan: [place] Barriers to Discharge: Continued Medical Work up  Expected Discharge Plan and Services Expected Discharge Plan: [place] In-house Referral: Clinical Social Work Discharge Planning Services: CM Consult Post Acute Care Choice: [place] arrangements for the past 2 months: [place]: PT, OT, RN [place] Agency: [facility] [employer]) Date [place]: 03/17/20 Time [place]: 1243 Representative spoke with at [place]: [place] Interventions    Readmission Risk Interventions No flowsheet data found.

## 2020-03-22 NOTE — Progress Notes (Signed)
[place] for IV heparin >>apixaban Indication: hx of atrial fibrillation  Allergies  Allergen Reactions  . Penicillins Anaphylaxis    Has patient had a PCN reaction causing immediate rash, facial/tongue/throat swelling, SOB or lightheadedness with hypotension: Yes Has patient had a PCN reaction causing severe rash involving mucus membranes or skin necrosis: No Has patient had a PCN reaction that required hospitalization No Has patient had a PCN reaction occurring within the last 10 years: No If all of the above answers are "NO", then may proceed with Cephalosporin use.   . Statins Other (See Comments)    REACTION: elevated LFT's  . Tylenol [Acetaminophen] Other (See Comments)    If taken with Vytorin at risk for liver damage  . Adhesive [Tape] Other (See Comments)    Irritates skin  . Amiodarone Other (See Comments)    unk  . Quinolones Other (See Comments)    UNk  . Tape Other (See Comments)    Redness, Please use "paper" tape only.  . Codeine Other (See Comments)    feel funny, head is fuzzy    Patient Measurements: Height: 5' 4"  (162.6 cm) Weight: 68.8 kg (151 lb 11.2 oz) IBW/kg (Calculated) : 54.7 Heparin Dosing Weight: 68.6 kg  Vital Signs: Temp: 98.4 F (36.9 C) (09/08 0420) BP: 134/79 (09/08 0911) Pulse Rate: 75 (09/08 0911)  Labs: Recent Labs    03/20/20 0507 03/20/20 1713 03/21/20 0511 03/21/20 0511 03/21/20 1633 03/22/20 0300 03/22/20 0504  HGB 9.4*   < > 8.8*   < > 9.0* 9.1*  --   HCT 30.2*   < > 27.1*  --  29.0* 28.5*  --   PLT 237  --   --   --   --  283  --   APTT  --   --   --   --   --  101* 89*  HEPARINUNFRC  --   --   --   --   --  0.24*  --   CREATININE 0.87  --   --   --   --   --   --    < > = values in this interval not displayed.    Estimated Creatinine Clearance: 44.2 mL/min (by C-G formula based on SCr of 0.87 mg/dL).   Assessment: 52 y/oF on Apixaban PTA for atrial fibrillation s/p  outpatient hemorrhoidectomy on 9/1, presented to the ED on 9/2 with generalized weakness. She was found to have symptomatic anemia. Anticoagulation was transitioned to heparin infusion on 9/3. Was transitioned back to Apixaban on 9/4, but subsequently had another episode of bright red blood per rectum and Apixaban held again (received last dose 9/4 at 2139). General surgery recommended to hold anticoagulation for 5 days, but patient reluctant given high stroke risk. Pharmacy consulted to resume IV heparin infusion today. Hgb low but stable at 9.   Today, 03/22/20  To transition back to apixaban Age > 80, wt > 60 kg, SCr < 1.5> still qualifies for PTA dose of 5 mg po bid Hg stable at 9.1 PLTC stable at 283 No significant rectal bleeding during CCS exam> OK to transition to PO anticoag per CCS   Goal of Therapy:  Heparin level 0.3-0.7 units/ml  APTT 66-102 seconds Monitor platelets by anticoagulation protocol: Yes   Plan: Dc heparin drip Resume apixaban 5 mg po bid Dc aPTTs Pharmacy to sign off  Eudelia Bunch, Pharm.D 03/22/2020 11:56 AM

## 2020-03-22 NOTE — Progress Notes (Signed)
Patient not seen. Chart reviewed.   Rates mildly elevated today however continues on labetalol. Hb stable.   Continue to monitor for bleeding. Resume AC when safe from surgical standpoint.   Cardiology will follow.

## 2020-03-22 NOTE — Progress Notes (Signed)
HPI: 86 year old female who was hospitalized 8/9-8/13/2021 for rectal bleeding secondary to hemorrhoids, on chronic anticoagulation for atrial fibrillation.  S/p outpatient hemorrhoidectomy 03/15/2020.  Readmitted 03/16/2020, with weakness, and H/H 7.6/24.  Patient transfused 1 unit of packed RBCs 03/16/2020.  CC: Subjective: cc rectal pressure and discomfort. Had a couple of BMs yesterday that were less watery but not formed. Reports scant bright red blood this AM on chuck pad. Had a sitz bath this AM. Hep gtt restarted yesterday and pt is hemodynamically stable, hgb 9.1 from 9.4 on 9/6. APTT 89.     Objective: Vital signs in last 24 hours: Temp:  [98.4 F (36.9 C)-98.8 F (37.1 C)] 98.4 F (36.9 C) (09/08 0420) Pulse Rate:  [75-105] 75 (09/08 0911) Resp:  [18-24] 24 (09/08 0420) BP: (112-134)/(53-87) 134/79 (09/08 0911) SpO2:  [97 %-98 %] 98 % (09/08 0420) Weight:  [68.8 kg] 68.8 kg (09/08 0500) Last BM Date: 03/21/20 . heparin 900 Units/hr (03/21/20 1837)   720 p.o. recorded Voided x2 recorded No BM recorded CBC Latest Ref Rng & Units 03/21/2020 03/20/2020 03/20/2020  Hemoglobin 12.0 - 15.0 g/dL 8.8(L) 9.0(L) 9.4(L)  Hematocrit 36 - 46 % 27.1(L) 27.7(L) 30.2(L)  Platelets 150 - 400 K/uL - - 237    Intake/Output from previous day: 09/07 0701 - 09/08 0700 In: 361.9 [P.O.:360; I.V.:1.9] Out: 250 [Urine:250] Intake/Output this shift: No intake/output data recorded.  General appearance: alert, cooperative and no distress Rectal: Her sites look fine, no blood on external exam, scant old blood on the pad in her mesh underwear.  Mild erythema/skin irritation of skin on buttock - but not blanching erythema or cellulitis.  Lab Results:  Recent Labs    03/20/20 0507 03/20/20 1713 03/21/20 1633 03/22/20 0300  WBC 6.0  --   --  5.9  HGB 9.4*   < > 9.0* 9.1*  HCT 30.2*   < > 29.0* 28.5*  PLT 237  --   --  283   < > = values in this interval not displayed.    BMET Recent Labs     03/20/20 0507  NA 136  K 3.8  CL 106  CO2 21*  GLUCOSE 80  BUN 12  CREATININE 0.87  CALCIUM 8.6*   PT/INR No results for input(s): LABPROT, INR in the last 72 hours.  No results for input(s): AST, ALT, ALKPHOS, BILITOT, PROT, ALBUMIN in the last 168 hours.   Lipase     Component Value Date/Time   LIPASE 39 10/27/2017 1706     Medications: . feeding supplement (ENSURE ENLIVE)  237 mL Oral BID BM  . hydrocortisone   Rectal BID  . labetalol  300 mg Oral q morning - 10a  . labetalol  450 mg Oral QHS  . levothyroxine  25 mcg Oral Q0600  . psyllium  1 packet Oral Daily  . sodium chloride flush  3 mL Intravenous Q12H    Assessment/Plan Hx atrial fibrillation on Eliquis  -Eliquis on hold/heparin on hold Anemia secondary to rectal bleeding   - H/H Chronic diastolic congestive heart failure Permanent transvenous pacemaker Hypothyroid Hx CVA Hx hypertension Hx hyperlipidemia Hx intracranial hemorrhage Hx NASH Hx polymyalgia rheumatica Multiple medical allergies E coli UTI  Recurrent rectal bleeding S/p 3 column hemorrhoidectomy 09/12/5398, Dr. Yetta Barre -continue sitz bath 2-3 times per day, Anusol BID - ensure 2-3x times daily while her appetite is poor - no significant rectal bleeding during my exam, H&H stable on hep gtt, transition to PO anticoagulation per  primary team.   FEN: Heart healthy diet ID: Bactrim DS 9/4>> day 4 DVT: SCDs -Eliquis/heparin on hold Follow-up: Dr. Leighton Ruff     LOS: 3 days    Jill Alexanders 03/22/2020 Please see Amion

## 2020-03-22 NOTE — Progress Notes (Signed)
Pt informed RN that she has began having pressure in her abdominal area. Feels like pressure from the top and pushes down towards her bottom. Pt states that she "has not had a good bm since she has been here". States that she has had a few spots of bm but nothing more. RN assessed pt bottom and saw a small smear of mushy bm with blood mixed in. Cleaned pt bottom and more red blood leaked out. Made provider Kamineni and Provider Gerkin aware. Charge RN was also made aware. Provider Gerkin states that he wants the pt to have another sitz bath and to continue monitoring the bleeding for the next couple of hours. If the bleeding continues the RN is to page the on call provider back. Pt now states that the belly pressure is easing up and not as intense.  Will continue to monitor and assess pt for changes.

## 2020-03-23 LAB — CBC
HCT: 27.8 % — ABNORMAL LOW (ref 36.0–46.0)
Hemoglobin: 8.8 g/dL — ABNORMAL LOW (ref 12.0–15.0)
MCH: 29.4 pg (ref 26.0–34.0)
MCHC: 31.7 g/dL (ref 30.0–36.0)
MCV: 93 fL (ref 80.0–100.0)
Platelets: 267 10*3/uL (ref 150–400)
RBC: 2.99 MIL/uL — ABNORMAL LOW (ref 3.87–5.11)
RDW: 17.8 % — ABNORMAL HIGH (ref 11.5–15.5)
WBC: 5.5 10*3/uL (ref 4.0–10.5)
nRBC: 0 % (ref 0.0–0.2)

## 2020-03-23 MED ORDER — GERHARDT'S BUTT CREAM
TOPICAL_CREAM | Freq: Three times a day (TID) | CUTANEOUS | Status: DC
  Administered 2020-03-23: 1 via TOPICAL
  Filled 2020-03-23: qty 1

## 2020-03-23 NOTE — Progress Notes (Signed)
CC: Bleeding post hemorrhoidectomy  Subjective: Her bleeding issue appears to have pretty much resolved.  She is having a lot of loose stools sometimes just leaking loose stools.  Enough to irritate her skin.  She is complaining of abdominal cramps which precedes loose stools and voiding.  Her skin is fairly irritated around her rectum and is staying wet.  There is no ointment on her rectum or skin around it which we requested they do earlier this week.  She now has an order for it.  Objective: Vital signs in last 24 hours: Temp:  [98.2 F (36.8 C)-99.2 F (37.3 C)] 99.2 F (37.3 C) (09/09 0527) Pulse Rate:  [98-110] 98 (09/09 0527) Resp:  [18-20] 18 (09/09 0527) BP: (126-131)/(65-71) 131/71 (09/09 0527) SpO2:  [93 %-96 %] 93 % (09/09 0527) Weight:  [67.5 kg] 67.5 kg (09/09 0527) Last BM Date: 03/21/20 350 urine Stool x 7 Afebrile, tachycardic BP stable CBC Latest Ref Rng & Units 03/23/2020 03/22/2020 03/22/2020  WBC 4.0 - 10.5 K/uL 5.5 - 5.9  Hemoglobin 12.0 - 15.0 g/dL 8.8(L) 9.2(L) 9.1(L)  Hematocrit 36 - 46 % 27.8(L) 30.0(L) 28.5(L)  Platelets 150 - 400 K/uL 267 - 283    Intake/Output from previous day: 09/08 0701 - 09/09 0700 In: -  Out: 350 [Urine:350] Intake/Output this shift: No intake/output data recorded.  General appearance: alert, cooperative, no distress and anxious rectum:  No bleeding, some loose stool, no swelling or irritation .  Skin around the rectum is wet and irritated, she looks like she has some redness from bed pressure.   Lab Results:  Recent Labs    03/22/20 0300 03/22/20 0300 03/22/20 1641 03/23/20 0514  WBC 5.9  --   --  5.5  HGB 9.1*   < > 9.2* 8.8*  HCT 28.5*   < > 30.0* 27.8*  PLT 283  --   --  267   < > = values in this interval not displayed.    BMET No results for input(s): NA, K, CL, CO2, GLUCOSE, BUN, CREATININE, CALCIUM in the last 72 hours. PT/INR No results for input(s): LABPROT, INR in the last 72 hours.  No results for  input(s): AST, ALT, ALKPHOS, BILITOT, PROT, ALBUMIN in the last 168 hours.   Lipase     Component Value Date/Time   LIPASE 39 10/27/2017 1706     Medications:  apixaban  5 mg Oral BID   feeding supplement (ENSURE ENLIVE)  237 mL Oral BID BM   hydrocortisone   Rectal BID   labetalol  300 mg Oral q morning - 10a   labetalol  450 mg Oral QHS   levothyroxine  25 mcg Oral Q0600   psyllium  1 packet Oral Daily   sodium chloride flush  3 mL Intravenous Q12H    Assessment/Plan Hx atrial fibrillation on Eliquis  -Eliquis on hold/heparin on hold Anemia secondary to rectal bleeding  - H/H 6.5/78.4>>6.9/62>>9.5/28.4>>1.3/24>>4.0/10.2(7/2) Chronic diastolic congestive heart failure Permanent transvenous pacemaker Hypothyroid Hx CVA Hx hypertension Hx hyperlipidemia Hx intracranial hemorrhage Hx NASH Hx polymyalgia rheumatica Multiple medical allergies E coli UTI  Recurrent rectal bleeding S/p 3 column hemorrhoidectomy 11/15/6642, Dr. Yetta Barre -continue sitz bath 2-3 times per day, Anusol BID - ensure 2-3x times daily while her appetite is poor - no significant rectal bleeding during my exam, H&H stable on hep gtt, transition to PO anticoagulation per primary team.   FEN: Heart healthy diet ID: Bactrim DS 9/4-9/7 DVT: SCDs -Eliquis restarted 9/8 Follow-up:  Dr. Leighton Ruff   Plan:  I talked with her husband who has some hearing issues and her daughter on the phone in the room.  I told them from our standpoint her H/H was stable, the best Rx for her rectal pain in the Sitz baths.  They have refused the Metamucil we started mostly to help absorb fluid in the colon.  We could also try Questran.  I hesitate to give a fresh hemorrhoidectomy imodium.  She can follow up with Dr. Marcello Moores in the office as scheduled.           LOS: 4 days    Eulas Schweitzer 03/23/2020 Please see Amion

## 2020-03-23 NOTE — Progress Notes (Addendum)
Progress Note  Patient Name: [name] [name] Date of Encounter: 03/23/2020  Primary Cardiologist: Virl Axe, MD  Subjective   Having significant lower abdomen/urinary pain>>possible spasm. Chuck pad with brown liquid>>>reports approximately 3-4 episodes this AM. Hb dropped to 8.8 from 9.2 yesterday. Imaging overnight normal.    Inpatient Medications    Scheduled Meds:  apixaban  5 mg Oral BID   feeding supplement (ENSURE ENLIVE)  237 mL Oral BID BM   hydrocortisone   Rectal BID   labetalol  300 mg Oral q morning - 10a   labetalol  450 mg Oral QHS   levothyroxine  25 mcg Oral Q0600   psyllium  1 packet Oral Daily   sodium chloride flush  3 mL Intravenous Q12H   Continuous Infusions:  PRN Meds: acetaminophen **OR** acetaminophen, hydrOXYzine, lidocaine, ondansetron **OR** ondansetron (ZOFRAN) IV, polyvinyl alcohol   Vital Signs    Vitals:   03/22/20 0911 03/22/20 1419 03/22/20 2122 03/23/20 0527  BP: 134/79 126/65 128/67 131/71  Pulse: 75 98 (!) 110 98  Resp:  18 20 18   Temp:  98.2 F (36.8 C) 98.6 F (37 C) 99.2 F (37.3 C)  TempSrc:  Oral Oral Oral  SpO2:  94% 96% 93%  Weight:    67.5 kg  Height:        Intake/Output Summary (Last 24 hours) at 03/23/2020 0749 Last data filed at 03/22/2020 1721 Gross per 24 hour  Intake --  Output 350 ml  Net -350 ml   Filed Weights   03/21/20 0541 03/22/20 0500 03/23/20 0527  Weight: 69 kg 68.8 kg 67.5 kg    Physical Exam   General: Elderly, in pain  Neck: Negative for carotid bruits. No JVD Lungs:Clear to ausculation bilaterally. No wheezes, rales, or rhonchi. Breathing is unlabored. Cardiovascular: Irregularly irregular with S1 S2. No murmurs Abdomen: Soft, non-tender, non-distended. No obvious abdominal masses. Extremities: No edema. Radial pulses 2+ bilaterally Neuro: Alert and oriented. No focal deficits. No facial asymmetry. MAE spontaneously. Psych: Responds to questions appropriately with normal  affect.    Labs    Chemistry Recent Labs  Lab 03/18/20 0527 03/19/20 0431 03/20/20 0507  NA 140 138 136  K 3.9 4.0 3.8  CL 111 108 106  CO2 21* 20* 21*  GLUCOSE 70 66* 80  BUN 15 14 12   CREATININE 0.92 0.87 0.87  CALCIUM 9.0 8.8* 8.6*  GFRNONAA 56* >60 >60  GFRAA >60 >60 >60  ANIONGAP 8 10 9      Hematology Recent Labs  Lab 03/20/20 0507 03/20/20 1713 03/22/20 0300 03/22/20 1641 03/23/20 0514  WBC 6.0  --  5.9  --  5.5  RBC 3.23*  --  3.09*  --  2.99*  HGB 9.4*   < > 9.1* 9.2* 8.8*  HCT 30.2*   < > 28.5* 30.0* 27.8*  MCV 93.5  --  92.2  --  93.0  MCH 29.1  --  29.4  --  29.4  MCHC 31.1  --  31.9  --  31.7  RDW 17.4*  --  17.6*  --  17.8*  PLT 237  --  283  --  267   < > = values in this interval not displayed.    Cardiac EnzymesNo results for input(s): TROPONINI in the last 168 hours. No results for input(s): TROPIPOC in the last 168 hours.   BNPNo results for input(s): BNP, PROBNP in the last 168 hours.   DDimer No results for input(s): DDIMER  in the last 168 hours.   Radiology    DG Abd Portable 1V  Result Date: 03/22/2020 CLINICAL DATA:  Constipation EXAM: PORTABLE ABDOMEN - 1 VIEW COMPARISON:  None. FINDINGS: Bowel gas pattern is unremarkable. Stool burden is mild. No acute osseous abnormality. IMPRESSION: Normal bowel gas pattern.  Mild stool burden. Electronically Signed   By: Macy Mis M.D.   On: 03/22/2020 18:57   Telemetry    03/23/20 AF with elevated rates in the 120 range - Personally Reviewed  ECG    No new tracing as of 03/23/20 - Personally Reviewed  Cardiac Studies   Echocardiogram   1. Left ventricular ejection fraction, by estimation, is 55 to 60%. The  left ventricle has normal function. The left ventricle has no regional  wall motion abnormalities. Left ventricular diastolic function could not  be evaluated. Left ventricular  diastolic function could not be evaluated.  2. Right ventricular systolic function is normal. The  right ventricular  size is mildly enlarged. There is mildly elevated pulmonary artery  systolic pressure. The estimated right ventricular systolic pressure is  70.2 mmHg.  3. Left atrial size was severely dilated.  4. The mitral valve is grossly normal. Mild mitral valve regurgitation.  No evidence of mitral stenosis.  5. Tricuspid valve regurgitation is mild to moderate.  6. The aortic valve is tricuspid. Aortic valve regurgitation is not  visualized. Mild to moderate aortic valve sclerosis/calcification is  present, without any evidence of aortic stenosis.  7. Aneurysm of the ascending aorta, measuring 46 mm.  8. The inferior vena cava is normal in size with greater than 50%  respiratory variability, suggesting right atrial pressure of 3 mmHg.   Comparison(s): A prior study was performed on 03/28/2015. Prior images  reviewed side by side. Changes from prior study are noted. EF unchanged.  Aorta up to 46 mm from 44 mm in 2016. Afib now present.   Patient Profile     86 y.o. female with a hx of permanent atrial fibrillation on Eliquis, sick sinus syndrome status post PPM, chronic diastolic heart failure (EF 55 to 60%), CVA, nonobstructive CAD, hypertension, hypothyroidismwho is being seen today for the evaluation ofatrial fibrillationtat the request of Dr Erlinda Hong.  Assessment & Plan    1. Permanent AF: -Rates elevated on telemetry today however patient in pain -Eliquis resumed however continues to have lower abdominal/rectal pain with spastic episodes of brown liquid (not stool) concerning for continued bleeding -Hb from 9.2 to 8.8 today  -CHADS2VASC= 7 (CHF,hypertension, age x2, stroke, female)   2. Anemia: -s/p hemorrhoidectomy 03/15/2020 -Recurrent bleeding which requiring transfusion. -Eliquis resumed however Hb continues to drop>>>8.8 today   3. Chronic diastolic HF: -Appears euvolemic on exam with no edema or SOB  4. Hypotension/hypertension: -Stabilized,  131/71>128/67>126/65 -Continue labetalol for rate control  Signed, Kathyrn Drown NP-C [facility] Pager: [phone] 03/23/2020, 7:49 AM     For questions or updates, please contact   Please consult [email or web address] for contact info under Cardiology/STEMI.  Patient seen and examined with Kathyrn Drown, NP-C.  Agree as above, with the following exceptions and changes as noted below.  She is feeling well today, and tells me the bleeding has stopped.. Gen: NAD, CV: iRRR, no murmurs, Lungs: clear, Abd: soft, Extrem: Warm, well perfused, no edema, Neuro/Psych: alert and oriented x 3, normal mood and affect. All available labs, radiology testing, previous records reviewed.  She has been continued on Eliquis without recurrent bleeding.  She is anticipating dismissal to SNF.  She  is continued on labetalol for rate control, and rate control is reasonable.  Could uptitrate labetalol if needed to aim for heart rate less than 110 bpm.  Elouise Munroe, MD 03/23/20 6:33 PM

## 2020-03-23 NOTE — Progress Notes (Signed)
Pt had to large brown stool with blood around stool post hemorrhoid surgery. No acute bleeding noted will cont to monitor. SRP, RN

## 2020-03-23 NOTE — Plan of Care (Signed)
  Problem: Elimination: Goal: Will not experience complications related to bowel motility Outcome: Adequate for Discharge   

## 2020-03-23 NOTE — Progress Notes (Signed)
PROGRESS NOTE    [name] [name]  JYN:[id number]  DOB: April 23, 1931  PCP: [name], MD Admit date:03/16/2020 Chief compliant: Weakness 86 y.o.femalewith medical history significant forpermanent atrial fibrillation on Eliquis, SSS s/p PPM, chronic diastolic CHF (EF 86-57% by TTE 09/09/2019), history of left parietal ICH/CVA, nonobstructive CAD, hypothyroidism, HTN, and HLD who presents to the ED for evaluation of generalized weakness and also complained of right upper thigh/hip pain but denied any falls or injuries. Patient was recently admitted from 02/21/2020-02/25/2020 for acute lower GI bleeding secondary to internal hemorrhoids. During admission she was transfused 1 unit PRBC. She underwent outpatient hemorrhoidectomy by general surgery, Dr. Leighton Ruff, yesterday (03/15/2020). Eliquis was held 24hours prior to the procedure.Preoperative labs were notable for hemoglobin 10.2 and creatinine 1.10 (baseline creatinine 0.8-0.9). She was dischargedtohome same day after surgery.  She had resumed her home dose of Eliquis on the morning of admission. ED Course: Afebrile,BP 94/44, pulse 70, RR 18, 94% on room air.Labs notable for hemoglobin 7.6, WBC 13.7, platelets 215,000, creatinine 1.83, sodium 134, potassium 3.2, bicarb 21, BUN 19, serum glucose 86.  She had received 800 cc normal saline by EMS in route, started on 1 unit PRBC transfusion in the ED Hospital course: Patient admitted to [facility] for further evaluation and management of AKI and anemia. Eliquis was held on admission and she was started on heparin drip.  No evidence of GI bleed on heparin, her Eliquis was restarted on 9/4.  Subsequently had another episode of bright red blood per rectum on 9/4 and Eliquis was again held.  General surgery was consulted who felt recurrent GI bleed was due to premature resumption of anticoagulation and recommended holding Eliquis for 5 days.  Seen by physical therapy and initial hospital course and recommended  SNF but since then patient has had some improvement.  Subjective:  Patient and husband somewhat frustrated regarding recurrent hematochezia even after hemorrhoidectomy.  She has been receiving sitz bath and bedside nurse getting ready to give her Anusol treatment for rectal pain.  Hemoglobin stable around 8.8-9.2 over the last 3 days.  Now on oral anticoagulation.  Objective: Vitals:   03/22/20 0911 03/22/20 1419 03/22/20 2122 03/23/20 0527  BP: 134/79 126/65 128/67 131/71  Pulse: 75 98 (!) 110 98  Resp:  18 20 18   Temp:  98.2 F (36.8 C) 98.6 F (37 C) 99.2 F (37.3 C)  TempSrc:  Oral Oral Oral  SpO2:  94% 96% 93%  Weight:    67.5 kg  Height:        Intake/Output Summary (Last 24 hours) at 03/23/2020 1357 Last data filed at 03/22/2020 1721 Gross per 24 hour  Intake --  Output 350 ml  Net -350 ml   Filed Weights   03/21/20 0541 03/22/20 0500 03/23/20 0527  Weight: 69 kg 68.8 kg 67.5 kg    Physical Examination:  General: Moderately built, no acute distress noted Head ENT: Atraumatic normocephalic, PERRLA, neck supple Heart: S1-S2 heard, irregular rate and rhythm, no murmurs.  No leg edema noted Lungs: Equal air entry bilaterally, no rhonchi or rales on exam, no accessory muscle use Abdomen: Bowel sounds heard, soft, nontender, nondistended. No organomegaly.  No CVA tenderness Extremities: No pedal edema.  No cyanosis or clubbing. Neurological: Awake alert oriented x3, no focal weakness or numbness, strength and sensations to crude touch intact Skin: No wounds or rashes.  Data Reviewed: I have personally reviewed following labs and imaging studies  CBC: Recent Labs  Lab 03/16/20 1706 03/17/20  [id number] 03/18/20 0527 03/18/20 0527 03/19/20 0431 03/19/20 1714 03/20/20 0507 03/20/20 1713 03/21/20 0511 03/21/20 1633 03/22/20 0300 03/22/20 1641 03/23/20 0514  WBC 13.7*   < > 11.4*  --  9.3  --  6.0  --   --   --  5.9  --  5.5  NEUTROABS 11.3*  --   --   --  7.3  --    --   --   --   --   --   --   --   HGB 7.6*   < > 9.2*   < > 9.3*   < > 9.4*   < > 8.8* 9.0* 9.1* 9.2* 8.8*  HCT 24.4*   < > 29.3*   < > 29.3*   < > 30.2*   < > 27.1* 29.0* 28.5* 30.0* 27.8*  MCV 95.7   < > 93.6  --  93.6  --  93.5  --   --   --  92.2  --  93.0  PLT 215   < > 210  --  233  --  237  --   --   --  283  --  267   < > = values in this interval not displayed.   Basic Metabolic Panel: Recent Labs  Lab 03/16/20 1706 03/17/20 0623 03/18/20 0527 03/19/20 0431 03/20/20 0507  NA 134* 136 140 138 136  K 3.2* 4.0 3.9 4.0 3.8  CL 105 108 111 108 106  CO2 21* 21* 21* 20* 21*  GLUCOSE 86 92 70 66* 80  BUN 19 19 15 14 12   CREATININE 1.83* 1.29* 0.92 0.87 0.87  CALCIUM 8.8* 8.8* 9.0 8.8* 8.6*  MG 1.8  --  1.7 2.2  --    GFR: Estimated Creatinine Clearance: 43.8 mL/min (by C-G formula based on SCr of 0.87 mg/dL). Liver Function Tests: No results for input(s): AST, ALT, ALKPHOS, BILITOT, PROT, ALBUMIN in the last 168 hours. No results for input(s): LIPASE, AMYLASE in the last 168 hours. No results for input(s): AMMONIA in the last 168 hours. Coagulation Profile: No results for input(s): INR, PROTIME in the last 168 hours. Cardiac Enzymes: No results for input(s): CKTOTAL, CKMB, CKMBINDEX, TROPONINI in the last 168 hours. BNP (last 3 results) No results for input(s): PROBNP in the last 8760 hours. HbA1C: No results for input(s): HGBA1C in the last 72 hours. CBG: Recent Labs  Lab 03/20/20 1502  GLUCAP 109*   Lipid Profile: No results for input(s): CHOL, HDL, LDLCALC, TRIG, CHOLHDL, LDLDIRECT in the last 72 hours. Thyroid Function Tests: No results for input(s): TSH, T4TOTAL, FREET4, T3FREE, THYROIDAB in the last 72 hours. Anemia Panel: No results for input(s): VITAMINB12, FOLATE, FERRITIN, TIBC, IRON, RETICCTPCT in the last 72 hours. Sepsis Labs: No results for input(s): PROCALCITON, LATICACIDVEN in the last 168 hours.  Recent Results (from the past 240 hour(s))   SARS CORONAVIRUS 2 (TAT 6-24 HRS) Nasopharyngeal Nasopharyngeal Swab     Status: None   Collection Time: 03/13/20  2:57 PM   Specimen: Nasopharyngeal Swab  Result Value Ref Range Status   SARS Coronavirus 2 NEGATIVE NEGATIVE Final    Comment: (NOTE) SARS-CoV-2 target nucleic acids are NOT DETECTED.  The SARS-CoV-2 RNA is generally detectable in upper and lower respiratory specimens during the acute phase of infection. Negative results do not preclude SARS-CoV-2 infection, do not rule out co-infections with other pathogens, and should not be used as the sole basis for treatment or other patient management  decisions. Negative results must be combined with clinical observations, patient history, and epidemiological information. The expected result is Negative.  Fact Sheet for Patients: SugarRoll.be  Fact Sheet for Healthcare Providers: https://www.woods-mathews.com/  This test is not yet approved or cleared by the [place] FDA and  has been authorized for detection and/or diagnosis of SARS-CoV-2 by FDA under an Emergency Use Authorization (EUA). This EUA will remain  in effect (meaning this test can be used) for the duration of the COVID-19 declaration under Se ction 564(b)(1) of the Act, 21 U.S.C. section 360bbb-3(b)(1), unless the authorization is terminated or revoked sooner.  Performed at [facility], [place] [address], [place], [place] [address]   SARS Coronavirus 2 by RT PCR (hospital order, performed in [facility] hospital lab) Nasopharyngeal Nasopharyngeal Swab     Status: None   Collection Time: 03/16/20  8:42 PM   Specimen: Nasopharyngeal Swab  Result Value Ref Range Status   SARS Coronavirus 2 NEGATIVE NEGATIVE Final    Comment: (NOTE) SARS-CoV-2 target nucleic acids are NOT DETECTED.  The SARS-CoV-2 RNA is generally detectable in upper and lower respiratory specimens during the acute phase of infection. The  lowest concentration of SARS-CoV-2 viral copies this assay can detect is 250 copies / mL. A negative result does not preclude SARS-CoV-2 infection and should not be used as the sole basis for treatment or other patient management decisions.  A negative result may occur with improper specimen collection / handling, submission of specimen other than nasopharyngeal swab, presence of viral mutation(s) within the areas targeted by this assay, and inadequate number of viral copies (<250 copies / mL). A negative result must be combined with clinical observations, patient history, and epidemiological information.  Fact Sheet for Patients:   StrictlyIdeas.no  Fact Sheet for Healthcare Providers: BankingDealers.co.za  This test is not yet approved or  cleared by the [place] FDA and has been authorized for detection and/or diagnosis of SARS-CoV-2 by FDA under an Emergency Use Authorization (EUA).  This EUA will remain in effect (meaning this test can be used) for the duration of the COVID-19 declaration under Section 564(b)(1) of the Act, 21 U.S.C. section 360bbb-3(b)(1), unless the authorization is terminated or revoked sooner.  Performed at [facility], [place] [address] [place], [place] [address]   Culture, Urine     Status: Abnormal   Collection Time: 03/17/20 11:25 AM   Specimen: Urine, Clean Catch  Result Value Ref Range Status   Specimen Description   Final    URINE, CLEAN CATCH Performed at [facility], [place] [address] [place], [place] [address]    Special Requests   Final    NONE Performed at [facility], [place] [address] [place],  [place] [address]    Culture >=100,000 COLONIES/mL ESCHERICHIA COLI (A)  Final   Report Status 03/19/2020 FINAL  Final   Organism ID, Bacteria ESCHERICHIA COLI (A)  Final      Susceptibility   Escherichia coli - MIC*    AMPICILLIN <=2  SENSITIVE Sensitive     CEFAZOLIN <=4 SENSITIVE Sensitive     CEFTRIAXONE <=0.25 SENSITIVE Sensitive     CIPROFLOXACIN <=0.25 SENSITIVE Sensitive     GENTAMICIN <=1 SENSITIVE Sensitive     IMIPENEM <=0.25 SENSITIVE Sensitive     NITROFURANTOIN <=16 SENSITIVE Sensitive     TRIMETH/SULFA <=20 SENSITIVE Sensitive     AMPICILLIN/SULBACTAM <=2 SENSITIVE Sensitive     PIP/TAZO <=4 SENSITIVE Sensitive     * >=  100,000 COLONIES/mL ESCHERICHIA COLI      Radiology Studies: DG Abd Portable 1V  Result Date: 03/22/2020 CLINICAL DATA:  Constipation EXAM: PORTABLE ABDOMEN - 1 VIEW COMPARISON:  None. FINDINGS: Bowel gas pattern is unremarkable. Stool burden is mild. No acute osseous abnormality. IMPRESSION: Normal bowel gas pattern.  Mild stool burden. Electronically Signed   By: Macy Mis M.D.   On: 03/22/2020 18:57      Scheduled Meds: . apixaban  5 mg Oral BID  . feeding supplement (ENSURE ENLIVE)  237 mL Oral BID BM  . Gerhardt's butt cream   Topical TID  . hydrocortisone   Rectal BID  . labetalol  300 mg Oral q morning - 10a  . labetalol  450 mg Oral QHS  . levothyroxine  25 mcg Oral Q0600  . psyllium  1 packet Oral Daily  . sodium chloride flush  3 mL Intravenous Q12H   Continuous Infusions:     Assessment/Plan:  1.  Recurrent rectal bleeding with symptomatic anemia: Patient had hemoglobin 7.6 on presentation, improved to 9.2 with 1 unit PRBC transfusion.  Eliquis held on admission and patient monitored on heparin drip due to high stroke risk.  She did well on heparin drip without GI bleed but had recurrence when resumed on Eliquis 9/4 as well as 9/5.  Seen by general surgery yesterday and recommended to hold anticoagulation for 5 days.  Seen by general surgery on 9/6 and recommended supportive management for rectal bleeding but okayed for resumption of anticoagulation.  Since patient had recurrent albeit small amount of hematochezia with serial hemoglobin showing downtrend 9.4-> 8.8  ,   IV heparin again initiated on 9/7->cleared by general surgery this morning for transition to oral anticoagulation->started on Eliquis->reported by nurse later in the day that patient had pelvic pressure and had bloody bowel movement again.  Although hemoglobin lower at 8.8 today, overall stable given the trend in last 3 days.  Continue Eliquis.  General surgery notified and they recommended another sitz bath and to continue current management.  Patient frustrated that she is having persistent rectal bleeding even after hemorrhoidectomy.  She may need sigmoidoscopy to identify source of bleeding if continues to have issues.  Defer management to general surgery.  Continue sitz bath, Anusol and other conservative management measures recommended by them for now.  2.  AKI on CKD stage IIIa: Creatinine elevated at 1.83 on presentation likely due to bleeding/hypotension/dehydration and now normalized.  No evidence of hydronephrosis and moderate bilateral renal cortical atrophy on renal ultrasound.    3.  E. coli UTI: S/p treatment with Bactrim (allergic to Cipro/penicillin) for 3 days.    4.  Chronic atrial fibrillation, sick sinus syndrome status post pacemaker: Patient has high CHA2DS2-VASc 2 score of 7.  Calcium channel blockers and labetalol held on admission due to hypotension.  Now resumed in cardiology adjusted labetalol dosage back to home dose.  Cardiology also recommends resuming anticoagulation with Eliquis as long as okay with GS.  Concerns and plan as discussed in #1.  Discussed with Dr. Margaretann Loveless and they have signed off.  5.  Rectal hemorrhoids: Patient underwent hemorrhoidectomy on 9/1, hemoglobin preoperatively was 10.2.  Now dropped as discussed above and received PRBC transfusion in the ED.  Anticoagulation held last few days and being resumed as discussed above  6.  Chronic diastolic CHF: Hypovolemic/hypotensive on presentation with AKI requiring IV hydration.  Now renal function normalized  and off IV fluids.  Echo in February showed preserved EF.  7.  History of hypertension with hypovolemic hypotension: Present on admission.  Home medications including diltiazem, labetalol and losartan were held on admission now resumed on labetalol home dose.  Losartan remains on hold.  Will check orthostatics.    8. Hypothyroidism: Resume home medications  DVT prophylaxis: SCDs, resume anticoagulation hopefully in a.m. Code Status: DNR Family / Patient Communication: Discussed with patient and husband bedside.  Requested general surgery follow-up to talk to husband again as he has several questions regarding persistent rectal pain and recurrent hematochezia. Disposition Plan:   Status is: Inpatient  Remains inpatient appropriate because:Inpatient level of care appropriate due to severity of illness   Dispo: The patient is from: Home              Anticipated d/c is to: SNF now per PT. Social worker informed.               Anticipated d/c date is: 1- 2 days              Patient currently is medically cleared for discharge but awaiting SNF placement.    Time spent: 25 mins     >50% time spent in discussions with care team and coordination of care.    Guilford Shi, MD [facility] Pager in [place]  If 7PM-7AM, please contact night-coverage [email or web address] 03/23/2020, 1:57 PM

## 2020-03-23 NOTE — Progress Notes (Signed)
OT Cancellation Note  Patient Details Name: ALIZANDRA LOH MRN: [id number] DOB: Aug 18, 1930   Cancelled Treatment:    Reason Eval/Treat Not Completed: Other (comment) Attempted to see patient twice today. This morning patient refused due to pain. This afternoon patient refused due to just receiving her meal tray. Will f/u as able.  Sharyn Brilliant L Kyeshia Zinn 03/23/2020, 2:50 PM

## 2020-03-24 DIAGNOSIS — E876 Hypokalemia: Secondary | ICD-10-CM

## 2020-03-24 DIAGNOSIS — K922 Gastrointestinal hemorrhage, unspecified: Secondary | ICD-10-CM

## 2020-03-24 DIAGNOSIS — K649 Unspecified hemorrhoids: Secondary | ICD-10-CM

## 2020-03-24 LAB — CBC
HCT: 27.6 % — ABNORMAL LOW (ref 36.0–46.0)
Hemoglobin: 8.5 g/dL — ABNORMAL LOW (ref 12.0–15.0)
MCH: 29.1 pg (ref 26.0–34.0)
MCHC: 30.8 g/dL (ref 30.0–36.0)
MCV: 94.5 fL (ref 80.0–100.0)
Platelets: 297 10*3/uL (ref 150–400)
RBC: 2.92 MIL/uL — ABNORMAL LOW (ref 3.87–5.11)
RDW: 18 % — ABNORMAL HIGH (ref 11.5–15.5)
WBC: 5.1 10*3/uL (ref 4.0–10.5)
nRBC: 0.4 % — ABNORMAL HIGH (ref 0.0–0.2)

## 2020-03-24 LAB — HEMOGLOBIN AND HEMATOCRIT, BLOOD
HCT: 29.9 % — ABNORMAL LOW (ref 36.0–46.0)
Hemoglobin: 9.4 g/dL — ABNORMAL LOW (ref 12.0–15.0)

## 2020-03-24 LAB — SARS CORONAVIRUS 2 BY RT PCR (HOSPITAL ORDER, PERFORMED IN ~~LOC~~ HOSPITAL LAB): SARS Coronavirus 2: NEGATIVE

## 2020-03-24 MED ORDER — POLYVINYL ALCOHOL 1.4 % OP SOLN: 1.0000 [drp] | OPHTHALMIC | 0 refills | Status: DC | PRN

## 2020-03-24 MED ORDER — HYDROXYZINE HCL 10 MG PO TABS: 10.0000 mg | ORAL_TABLET | Freq: Three times a day (TID) | ORAL | 0 refills | Status: AC | PRN

## 2020-03-24 MED ORDER — ENSURE ENLIVE PO LIQD: 237.0000 mL | Freq: Two times a day (BID) | ORAL | 12 refills | Status: AC

## 2020-03-24 MED ORDER — LIDOCAINE 4 % EX CREA: TOPICAL_CREAM | Freq: Two times a day (BID) | CUTANEOUS | 0 refills | Status: DC | PRN

## 2020-03-24 MED ORDER — GERHARDT'S BUTT CREAM: 1.0000 "application " | TOPICAL_CREAM | Freq: Three times a day (TID) | CUTANEOUS | Status: DC

## 2020-03-24 MED ORDER — PSYLLIUM 95 % PO PACK: 1.0000 | PACK | Freq: Every day | ORAL | Status: AC

## 2020-03-24 MED ORDER — DILTIAZEM HCL ER COATED BEADS 120 MG PO CP24
120.0000 mg | ORAL_CAPSULE | Freq: Every day | ORAL | 2 refills | Status: DC
Start: 2020-03-24 — End: 2020-04-04

## 2020-03-24 NOTE — Discharge Instructions (Signed)
HEMORRHOIDS  The rectum is the last foot of your colon, and it naturally stretches to hold stool.  Hemorrhoidal piles are natural clusters of blood vessels that help the rectum and anal canal stretch to hold stool and allow bowel movements to eliminate feces.   Hemorrhoids are abnormally swollen blood vessels in the rectum.  Too much pressure in the rectum causes hemorrhoids by forcing blood to stretch and bulge the walls of the veins, sometimes even rupturing them.  Hemorrhoids can become like varicose veins you might see on a person's legs.  Most people will develop a flare of hemorrhoids in their lifetime.  When bulging hemorrhoidal veins are irritated, they can swell, burn, itch, cause pain, and bleed.  Most flares will calm down gradually own within a few weeks.  However, once hemorrhoids are created, they are difficult to get rid of completely and tend to flare more easily than the first flare.   Fortunately, good habits and simple medical treatment usually control hemorrhoids well, and surgery is needed only in severe cases. Types of Hemorrhoids:  Internal hemorrhoids usually don't initially hurt or itch; they are deep inside the rectum and usually have no sensation. If they begin to push out (prolapse), pain and burning can occur.  However, internal hemorrhoids can bleed.  Anal bleeding should not be ignored since bleeding could come from a dangerous source like colorectal cancer, so persistent rectal bleeding should be investigated by a doctor, sometimes with a colonoscopy.  External hemorrhoids cause most of the symptoms - pain, burning, and itching. Nonirritated hemorrhoids can look like small skin tags coming out of the anus.   Thrombosed hemorrhoids can form when a hemorrhoid blood vessel bursts and causes the hemorrhoid to suddenly swell.  A purple blood clot can form in it and become an excruciatingly painful lump at the anus. Because of these unpleasant symptoms, immediate incision and  drainage by a surgeon at an office visit can provide much relief of the pain.    PREVENTION Avoiding the most frequent causes listed below will prevent most cases of hemorrhoids: Constipation Hard stools Diarrhea  Constant sitting  Straining with bowel movements Sitting on the toilet for a long time  Severe coughing  episodes Pregnancy / Childbirth  Heavy Lifting  Sometimes avoiding the above triggers is difficult:  How can you avoid sitting all day if you have a seated job? Also, we try to avoid coughing and diarrhea, but sometimes its beyond your control.  Still, there are some practical hints to help: Keep the anal and genital area clean.  Moistened tissues such as flushable wet wipes are less irritating than toilet paper.  Using irrigating showers or bottle irrigation washing gently cleans this sensitive area.   Avoid dry toilet paper when cleaning after bowel movements.  Marland Kitchen Keep the anal and genital area dry.  Lightly pat the rectal area dry.  Avoid rubbing.  Talcum or baby powders can help GET YOUR STOOLS SOFT.   This is the most important way to prevent irritated hemorrhoids.  Hard stools are like sandpaper to the anorectal canal and will cause more problems.  The goal: ONE SOFT BOWEL MOVEMENT A DAY!  BMs from every other day to 3 times a day is a tolerable range Treat coughing, diarrhea and constipation early since irritated hemorrhoids may soon follow.  If your main job activity is seated, always stand or walk during your breaks. Make it a point to stand and walk at least 5 minutes every hour  and try to shift frequently in your chair to avoid direct rectal pressure.  Always exhale as you strain or lift. Don't hold your breath.  Do not delay or try to prevent a bowel movement when the urge is present. Exercise regularly (walking or jogging 60 minutes a day) to stimulate the bowels to move. No reading or other activity while on the toilet. If bowel movements take longer than 5 minutes,  you are too constipated. AVOID CONSTIPATION Drink plenty of liquids (1 1/2 to 2 quarts of water and other fluids a day unless fluid restricted for another medical condition). Liquids that contain caffeine (coffee a, tea, soft drinks) can be dehydrating and should be avoided until constipation is controlled. Consider minimizing milk, as dairy products may be constipating. Eat plenty of fiber (30g a day ideal, more if needed).  Fiber is the undigested part of plant food that passes into the colon, acting as natures broom to encourage bowel motility and movement.  Fiber can absorb and hold large amounts of water. This results in a larger, bulkier stool, which is soft and easier to pass.  Eating foods high in fiber - 12 servings - such as  Vegetables: Root (potatoes, carrots, turnips), Leafy green (lettuce, salad greens, celery, spinach), High residue (cabbage, broccoli, etc.) Fruit: Fresh, Dried (prunes, apricots, cherries), Stewed (applesauce)  Whole grain breads, pasta, whole wheat Bran cereals, muffins, etc. Consider adding supplemental bulking fiber which retains large volumes of water: Psyllium ground seeds (native plant from [place])--available as Metamucil, Konsyl, Effersyllium, Per Diem Fiber, or the less expensive generic forms.  Citrucel  (methylcellulose wood fiber) . FiberCon (Polycarbophil) Polyethylene Glycol - and artificial fiber commonly called Miralax or Glycolax.  It is helpful for people with gassy or bloated feelings with regular fiber Flax Seed - a less gassy natural fiber  Laxatives can be useful for a short period if constipation is severe Osmotics (Milk of Magnesia, Fleets Phospho-Soda, Magnesium Citrate)  Stimulants (Senokot,   Castor Oil,  Dulcolax, Ex-Lax)    Laxatives are not a good long-term solution as it can stress the bowels and cause too much mineral loss and dehydration.   Avoid taking laxatives for more than 7 days in a row.  AVOID DIARRHEA Switch to  liquids and simpler foods for a few days to avoid stressing your intestines further. Avoid dairy products (especially milk & ice cream) for a short time.  The intestines often can lose the ability to digest lactose when stressed. Avoid foods that cause gassiness or bloating.  Typical foods include beans and other legumes, cabbage, broccoli, and dairy foods.  Every person has some sensitivity to other foods, so listen to your body and avoid those foods that trigger problems for you. Adding fiber (Citrucel, Metamucil, FiberCon, Flax seed, Miralax) gradually can help thicken stools by absorbing excess fluid and retrain the intestines to act more normally.  Slowly increase the dose over a few weeks.  Too much fiber too soon can backfire and cause cramping & bloating. Probiotics (such as active yogurt, Align, etc) may help repopulate the intestines and colon with normal bacteria and calm down a sensitive digestive tract.  Most studies show it to be of mild help, though, and such products can be costly. Medicines: Bismuth subsalicylate (ex. Kayopectate, Pepto Bismol) every 30 minutes for up to 6 doses can help control diarrhea.  Avoid if pregnant. Loperamide (Immodium) can slow down diarrhea.  Start with two tablets (69m total) first and then try one tablet  every 6 hours.  Avoid if you are having fevers or severe pain.  If you are not better or start feeling worse, stop all medicines and call your doctor for advice Call your doctor if you are getting worse or not better.  Sometimes further testing (cultures, endoscopy, X-ray studies, bloodwork, etc) may be needed to help diagnose and treat the cause of the diarrhea.  TROUBLESHOOTING IRREGULAR BOWELS 1) Avoid extremes of bowel movements (no bad constipation/diarrhea) 2) Miralax 17gm mixed in 8oz. water or juice-daily. May use BID as needed.  3) Gas-x,Phazyme, etc. as needed for gas & bloating.  4) Soft,bland diet. No spicy,greasy,fried foods.  5) Prilosec  over-the-counter as needed  6) May hold gluten/wheat products from diet to see if symptoms improve.  7)  May try probiotics (Align, Activa, etc) to help calm the bowels down 7) If symptoms become worse call back immediately.   TREATMENT OF HEMORRHOID FLARE If these preventive measures fail, you must take action right away! Hemorrhoids are one condition that can be mild in the morning and become intolerable by nightfall. Most hemorrhoidal flares take several weeks to calm down.  These suggestions can help: Warm soaks.  This helps more than any topical medication.  Use up to 8 times a day.  Usually sitz baths or sitting in a warm bathtub helps.  Sitting on moist warm towels are helpful.  Switching to ice packs/cool compresses can be helpful  Use a Sitz Bath 4-8 times a day for relief A sitz bath is a warm water bath taken in the sitting position that covers only the hips and buttocks. It may be used for either healing or hygiene purposes. Sitz baths are also used to relieve pain, itching, or muscle spasms. The water may contain medicine. Moist heat will help you heal and relax.  HOME CARE INSTRUCTIONS  Take 3 to 4 sitz baths a day. 1. Fill the bathtub half full with warm water. 2. Sit in the water and open the drain a little. 3. Turn on the warm water to keep the tub half full. Keep the water running constantly. 4. Soak in the water for 15 to 20 minutes. 5. After the sitz bath, pat the affected area dry first. SEEK MEDICAL CARE IF:  You get worse instead of better. Stop the sitz baths if you get worse.  Normalize your bowels.  Extremes of diarrhea or constipation will make hemorrhoids worse.  One soft bowel movement a day is the goal.  Fiber can help get your bowels regular Wet wipes instead of toilet paper Pain control with a NSAID such as ibuprofen (Advil) or naproxen (Aleve) or acetaminophen (Tylenol) around the clock.  Narcotics are constipating and should be minimized if possible Topical  creams contain steroids (bydrocortisone) or local anesthetic (xylocaine) can help make pain and itching more tolerable.   EVALUATION I

## 2020-03-24 NOTE — Care Management Important Message (Signed)
Important Message  Patient Details IM Letter given to the Patient Name: MAURIANNA BENARD MRN: [id number] Date of Birth: August 19, 1930   Medicare Important Message Given:  Yes     [name] 03/24/2020, 11:50 AM

## 2020-03-24 NOTE — Progress Notes (Addendum)
Physical Therapy Treatment Patient Details Name: [name] [name] MRN: [id number] DOB: [birth year] Today's Date: 03/24/2020    History of Present Illness 86 y.o. female with medical history significant for permanent atrial fibrillation on Eliquis, SSS s/p PPM, chronic diastolic CHF (EF 46-27% by TTE 09/09/2019), history of left parietal ICH/CVA, nonobstructive CAD, hypothyroidism, HTN, and HLD . Patient was recently admitted from 02/21/2020-02/25/2020 for acute lower GI bleeding secondary to internal hemorrhoids  She underwent outpatient hemorrhoidectomy (03/15/2020) and was discharged home. returned to ER for generalized weakness.    PT Comments    Pt very motivated to ambulate with therapy despite pain. Pt tolerates therapeutic exercises without complaints. Pt ambulates around room with RW, able to clear past obstacles and make turns with safe speed and good RW maneuvering. Pt denies dizziness, headache and only reports pain in bottom area without relief with isometric glute activation. Pt's spouse in room during session. Pt with elevated HR 103-148 during session, but without complaints so possible poor attachment of heart monitor; RN notified. Pt verbalizes wanting to d/c home over SNF, therapist not providing physical assistance at this time and pt demonstrates good functional strength; pt appears safe to d/c home with good family support. RN in room at [place] and pt returned to supine due to discomfort with sitting. Patient will benefit from continued physical therapy in hospital and recommendations below to increase strength, balance, endurance for safe ADLs and gait.    Follow Up Recommendations  Home health PT;Supervision - Intermittent     Equipment Recommendations  None recommended by PT    Recommendations for Other Services       Precautions / Restrictions Precautions Precautions: None Restrictions Weight Bearing Restrictions: No    Mobility  Bed Mobility Overal bed mobility: Needs  Assistance Bed Mobility: Supine to Sit;Sit to Supine  Supine to sit: Supervision Sit to supine: Supervision   General bed mobility comments: slow and increasead time due to pain, use of bedrail to upright trunk, no physical assist  Transfers Overall transfer level: Needs assistance Equipment used: Rolling walker (2 wheeled) Transfers: Sit to/from Stand Sit to Stand: Supervision  General transfer comment: BUE assisting to power up from seated EOB  Ambulation/Gait Ambulation/Gait assistance: Min guard;Supervision Gait Distance (Feet): 75 Feet Assistive device: Rolling walker (2 wheeled) Gait Pattern/deviations: WFL(Within Functional Limits);Decreased stride length Gait velocity: decreased   General Gait Details: pt ambulates around room, able to navigate past furniture safely, no LOB, denies any complaints other than pain in standing, declines rest breaks and no SOB noted   Stairs             Wheelchair Mobility    Modified Rankin (Stroke Patients Only)       Balance Overall balance assessment: Needs assistance Sitting-balance support: Feet supported;No upper extremity supported Sitting balance-Leahy Scale: Good Sitting balance - Comments: seated EOB   Standing balance support: Bilateral upper extremity supported;During functional activity Standing balance-Leahy Scale: Fair Standing balance comment: static without UE support, dynamic reliant on UE support       Cognition Arousal/Alertness: Awake/alert Behavior During Therapy: WFL for tasks assessed/performed Overall Cognitive Status: Within Functional Limits for tasks assessed  General Comments: Pt very motivated to mobilize with therapy and d/c home.      Exercises General Exercises - Lower Extremity Long Arc Quad: AROM;Strengthening;Both;10 reps;Seated Toe Raises: AROM;Strengthening;Both;10 reps;Seated Heel Raises: AROM;Strengthening;Both;10 reps;Seated    General Comments General comments (skin  integrity, edema, etc.): HR 103-148 during session, RN notified, pt without complaints  Pertinent Vitals/Pain Pain Assessment: 0-10 Pain Score: 10-Worst pain ever Pain Location: bottom with standing Pain Descriptors / Indicators: Discomfort;Sore;Pressure Pain Intervention(s): Limited activity within patient's tolerance;Monitored during session;Premedicated before session;Repositioned    Home Living                      Prior Function            PT Goals (current goals can now be found in the care plan section) Acute Rehab PT Goals Patient Stated Goal: Less pain. regain plof PT Goal Formulation: With patient/family Time For Goal Achievement: 04/04/20 Potential to Achieve Goals: Good Progress towards PT goals: Progressing toward goals    Frequency    Min 3X/week      PT Plan Discharge plan needs to be updated    Co-evaluation              AM-PAC PT "6 Clicks" Mobility   Outcome Measure  Help needed turning from your back to your side while in a flat bed without using bedrails?: A Little Help needed moving from lying on your back to sitting on the side of a flat bed without using bedrails?: A Little Help needed moving to and from a bed to a chair (including a wheelchair)?: None Help needed standing up from a chair using your arms (e.g., wheelchair or bedside chair)?: None Help needed to walk in hospital room?: None Help needed climbing 3-5 steps with a railing? : A Little 6 Click Score: 21    End of Session Equipment Utilized During Treatment: Gait belt Activity Tolerance: Patient tolerated treatment well Patient left: in bed;with call bell/phone within reach;with bed alarm set;with family/visitor present;with nursing/sitter in room Nurse Communication: Mobility status PT Visit Diagnosis: Muscle weakness (generalized) (M62.81);Difficulty in walking, not elsewhere classified (R26.2) Pain - Right/Left:  (bil) Pain - part of body: Leg     Time:  3361-2244 PT Time Calculation (min) (ACUTE ONLY): 25 min  Charges:  $Gait Training: 8-22 mins $Therapeutic Exercise: 8-22 mins                      Tori Jakwon Gayton PT, DPT 03/24/20, 10:57 AM

## 2020-03-24 NOTE — TOC Progression Note (Signed)
Transition of Care [facility]) - Progression Note    Patient Details  Name: [name] [name] MRN: [id number] Date of Birth: December 31, 1930  [facility] CM/SW Contact  Ross Ludwig, [place] Phone Number: 03/24/2020, 9:20 AM  Clinical Narrative:     CSW received phone call from patient's daughter Amy [phone].  She stated her dad will bring patient's Covid vaccine card.  [facility] would like copy of it sent to them before patient admits.     Expected Discharge Plan: [place] Barriers to Discharge: Continued Medical Work up  Expected Discharge Plan and Services Expected Discharge Plan: [place] In-house Referral: Clinical Social Work Discharge Planning Services: CM Consult Post Acute Care Choice: [place] arrangements for the past 2 months: [place]: PT, OT, RN [place] Agency: [facility] [employer]) Date [place]: 03/17/20 Time [place]: 1243 Representative spoke with at [place]: [place] ([place]) Interventions    Readmission Risk Interventions No flowsheet data found.

## 2020-03-24 NOTE — Progress Notes (Addendum)
Subjective: Had a small amount of blood on stool this am, but not overtly bleeding any other time.  Has pressure in her rectum when she stands, but no other pain.  ROS: See above, otherwise other systems negative  Objective: Vital signs in last 24 hours: Temp:  [98.4 F (36.9 C)-99.2 F (37.3 C)] 99.2 F (37.3 C) (09/10 0501) Pulse Rate:  [98-108] 108 (09/10 0501) Resp:  [18-20] 18 (09/10 0501) BP: (118-128)/(60-80) 126/60 (09/10 0501) SpO2:  [93 %-98 %] 96 % (09/10 0501) Weight:  [69.8 kg] 69.8 kg (09/10 0501) Last BM Date: 03/23/20  Intake/Output from previous day: 09/09 0701 - 09/10 0700 In: -  Out: 400 [Urine:400] Intake/Output this shift: No intake/output data recorded.  PE: Rectal: normal appearing rectum.  No bleeding present or blood noted.  nontender to palpation externally  Lab Results:  Recent Labs    03/23/20 0514 03/24/20 0458  WBC 5.5 5.1  HGB 8.8* 8.5*  HCT 27.8* 27.6*  PLT 267 297   BMET No results for input(s): NA, K, CL, CO2, GLUCOSE, BUN, CREATININE, CALCIUM in the last 72 hours. PT/INR No results for input(s): LABPROT, INR in the last 72 hours. CMP     Component Value Date/Time   NA 136 03/20/2020 0507   NA 141 11/26/2019 1245   K 3.8 03/20/2020 0507   CL 106 03/20/2020 0507   CO2 21 (L) 03/20/2020 0507   GLUCOSE 80 03/20/2020 0507   BUN 12 03/20/2020 0507   BUN 17 11/26/2019 1245   CREATININE 0.87 03/20/2020 0507   CREATININE 1.23 (H) 06/20/2016 1130   CALCIUM 8.6 (L) 03/20/2020 0507   PROT 5.7 (L) 02/21/2020 1849   ALBUMIN 3.4 (L) 02/21/2020 1849   AST 16 02/21/2020 1849   ALT 13 02/21/2020 1849   ALKPHOS 52 02/21/2020 1849   BILITOT 0.6 02/21/2020 1849   GFRNONAA >60 03/20/2020 0507   GFRAA >60 03/20/2020 0507   Lipase     Component Value Date/Time   LIPASE 39 10/27/2017 1706       Studies/Results: DG Abd Portable 1V  Result Date: 03/22/2020 CLINICAL DATA:  Constipation EXAM: PORTABLE ABDOMEN - 1 VIEW  COMPARISON:  None. FINDINGS: Bowel gas pattern is unremarkable. Stool burden is mild. No acute osseous abnormality. IMPRESSION: Normal bowel gas pattern.  Mild stool burden. Electronically Signed   By: Macy Mis M.D.   On: 03/22/2020 18:57    Anti-infectives: Anti-infectives (From admission, onward)   Start     Dose/Rate Route Frequency Ordered Stop   03/18/20 1200  sulfamethoxazole-trimethoprim (BACTRIM DS) 800-160 MG per tablet 1 tablet  Status:  Discontinued        1 tablet Oral Every 12 hours 03/18/20 1046 03/21/20 1403   03/18/20 1000  sulfamethoxazole-trimethoprim (BACTRIM) 400-80 MG per tablet 1 tablet  Status:  Discontinued        1 tablet Oral Every 12 hours 03/17/20 1946 03/18/20 1046   03/17/20 2030  sulfamethoxazole-trimethoprim (BACTRIM DS) 800-160 MG per tablet 1 tablet  Status:  Discontinued        1 tablet Oral  Once 03/17/20 1928 03/21/20 1244       Assessment/Plan Hx atrial fibrillation on Eliquis -Eliquis on hold/heparin on hold Anemia secondary to rectal bleeding - H/H 4.6/96.2>>9.5/28>>4.1/32.4>>4.0/10>>2.7/25.3(6/6) Chronic diastolic congestive heart failure Permanent transvenous pacemaker Hypothyroid Hx CVA Hx hypertension Hx hyperlipidemia Hx intracranial hemorrhage Hx NASH Hx polymyalgia rheumatica Multiple medical allergies E coli UTI  Recurrent rectal bleeding POD 9,  S/p 3 column hemorrhoidectomy 07/19/8680, Dr. Yetta Barre -continue sitz bath 2-3 times per day, AnusolBID - ensure 2-3x times daily while her appetite is poor - no significant rectal bleeding except some blood on her otherwise brown stools.  Her hgb is trending down slightly, but not unexpected given resumption of her eliquis and the procedure itself.  We discussed that it is not uncommon to have bleeding with BMs after a hemorrhoidectomy, especially in the setting up a blood thinner.   -given she is scared to stop her eliquis for what was initially the time period recommended,  she may continue to have oozing a little longer than others who aren't on blood thinners.  If this continues to cause her anxiety, then she will need to have her blood thinner held again; however, she is high risk for having her thinner held.  At this point, if her hgb continues to slowly trend down she can be transfused as needed, but she is still at 8.5, only from 8.8 yesterday, which is essentially relatively stable. -I tried to call the husband on his home phone and his cell phone with no answer.  Will try later -Surgically the patient is stable for DC to SNF   FEN: Heart healthy diet ID: Bactrim DS 9/4-9/7 DVT: SCDs -Eliquis restarted 9/8 Follow-up: Dr. Leighton Ruff   LOS: 5 days    Henreitta Cea , [facility] 03/24/2020, 8:56 AM Please see Amion for pager number during day hours 7:00am-4:30pm or 7:00am -11:30am on weekends

## 2020-03-24 NOTE — TOC Progression Note (Signed)
Transition of Care [facility]) - Progression Note    Patient Details  Name: [name] [name] MRN: [id number] Date of Birth: [birth year]  [facility]) CM/SW Contact  Purcell Mouton, RN Phone Number: 03/24/2020, 11:29 AM  Clinical Narrative:    A call to pt's daughter Ebony Hail [phone] to inform her that pt will discharge today to [facility].    Expected Discharge Plan: [place] Barriers to Discharge: Continued Medical Work up  Expected Discharge Plan and Services Expected Discharge Plan: [place] In-house Referral: Clinical Social Work Discharge Planning Services: CM Consult Post Acute Care Choice: [place] arrangements for the past 2 months: [place]: PT, OT, RN [place] Agency: [facility] ([facility] [employer]) Date [place]: 03/17/20 Time [place]: 1243 Representative spoke with at [place]: [place] ([place]) Interventions    Readmission Risk Interventions No flowsheet data found.

## 2020-03-24 NOTE — Progress Notes (Signed)
Pt discharged to home instructions reviewed with pt. Pt transferred to [facility], report called to [facility]. SRp, RN

## 2020-03-24 NOTE — Discharge Summary (Signed)
Physician Discharge Summary  [name] [name] SVX:[id number] DOB: 1930-12-26 DOA: 03/16/2020  PCP: [name], MD  Admit date: 03/16/2020 Discharge date: 03/24/2020 Consultations: General Surgery, Cardiology Admitted From: Home Disposition: SNF  Discharge Diagnoses:  Principal Problem:   Symptomatic anemia Active Problems:   AKI (acute kidney injury) ([place])   Atrial fibrillation ([place])   Acute lower GI bleeding   Essential hypertension   Chronic diastolic heart failure ([place])   Hypothyroidism   Hypokalemia   Hemorrhoids   Hospital Course Summary: Chief compliant: Weakness 86 y.o.femalewith medical history significant forpermanent atrial fibrillation on Eliquis, SSS s/p PPM, chronic diastolic CHF (EF 23-30% by TTE 09/09/2019), history of left parietal ICH/CVA, nonobstructive CAD, hypothyroidism, HTN, and HLD who presents to the ED for evaluation of generalized weakness and also complained of right upper thigh/hip pain but denied any falls or injuries. Patient was recently admitted from 02/21/2020-02/25/2020 for acute lower GI bleeding secondary to internal hemorrhoids. During admission she was transfused 1 unit PRBC. She underwent outpatient hemorrhoidectomy by general surgery, Dr. Leighton Ruff, yesterday (03/15/2020). Eliquis was held 24hours prior to the procedure.Preoperative labs were notable for hemoglobin 10.2 and creatinine 1.10 (baseline creatinine 0.8-0.9). She was dischargedtohome same day after surgery.  She had resumed her home dose of Eliquis on the morning of admission. ED Course: Afebrile,BP 94/44, pulse 70, RR 18, 94% on room air.Labs notable for hemoglobin 7.6, WBC 13.7, platelets 215,000, creatinine 1.83, sodium 134, potassium 3.2, bicarb 21, BUN 19, serum glucose 86.  She had received 800 cc normal saline by EMS in route, started on 1 unit PRBC transfusion in the ED Hospital course: Patient admitted to [facility] for further evaluation and management of AKI and acute blood loss  anemia.Eliquis was held on admission and she was started on heparin drip. No evidence of GI bleed on heparin, her Eliquis was restarted on 9/4. Subsequently had another episode of bright red blood per rectum on 9/4 and Eliquis was again held. General surgery was consulted who felt recurrent GI bleed was due to premature resumption of anticoagulation and recommended holding Eliquis for 5 days.  However patient got very anxious about prolonged holding of anticoagulation and requested cardiology input.  Primary cardiology service was consulted and patient was resumed on anticoagulation with IV heparin while monitoring for bleeding. Seen by physical therapy and initial hospital course and recommended SNF.  1.  Recurrent rectal bleeding with symptomatic anemia: Patient had hemoglobin 7.6 on presentation, improved to 9.2 with 1 unit PRBC transfusion.  Eliquis held on admission and patient monitored on heparin drip due to high stroke risk.  She did well on heparin drip without GI bleed but had recurrence when resumed on Eliquis 9/4 as well as 9/5.  Seen by general surgery yesterday and recommended to hold anticoagulation for 5 days.  Seen by general surgery on 9/6 and recommended supportive management for rectal bleeding but okayed for resumption of anticoagulation.  Since patient had recurrent albeit small amount of hematochezia with serial hemoglobin showing downtrend 9.4-> 8.8 ,   IV heparin again initiated on 9/7->cleared by general surgery 9/8 morning for transition to oral anticoagulation->started on Eliquis->reported by nurse later in the day that patient had pelvic pressure and had bloody bowel movement again. Although hemoglobin again slowly downtrending at 8.8 yesterday, 8.5 today with very mild ongoing hematochezia mostly with bowel movements, general surgery and cardiology services agreed that the best approach for this patient would be to continue anticoagulation (given risk of stroke outweighing risk of  mild bleeding) and periodically check hemoglobin, support with blood transfusion if less than 7.5 (given cardiac issues) at SNF.  She will be followed by general surgery service Dr. Marcello Moores in 2 weeks by which time post hemorrhoidectomy bleeding should subside even in the setting of anticoagulation.  If not, anoscopy would be considered as outpatient per discussion with surgery APP today.  Patient advised of the same and she feels comfortable with this plan.  She is cleared to go to skilled nursing facility by [facility] and cardiology services today.  2.  AKI on CKD stage IIIa: Creatinine elevated at 1.83 on presentation likely due to bleeding/hypotension/dehydration and now normalized.  Losartan held and patient received hydration. No evidence of hydronephrosis and moderate bilateral renal cortical atrophy on renal ultrasound.    3.  E. coli UTI: S/p treatment with Bactrim (allergic to Cipro/penicillin) for 3 days.    4.  Chronic atrial fibrillation, sick sinus syndrome status post pacemaker: Patient has high CHA2DS2-VASc 2 score of 7.  Calcium channel blockers and labetalol held on admission due to hypotension.  Now resumed in cardiology adjusted labetalol dosage back to home dose.  Heart rate was controlled until yesterday when it started going up again 98-104.  Will resume home dose of Cardizem CD 120 mg with holding parameters as blood pressure now improved. Cardiology also recommends resuming anticoagulation with Eliquis as long as okay with GS.  Concerns and plan as discussed in #1.  Discussed with Dr. Margaretann Loveless and they have signed off.  5.  Rectal hemorrhoids: Patient underwent hemorrhoidectomy on 9/1, hemoglobin preoperatively was 10.2.  Now dropped as discussed above and received PRBC transfusion in the ED.  Anticoagulation held last few days and  resumed now as discussed above.  General surgery recommends to continue Anusol and lidocaine cream as needed for rectal pain.  MiraLAX daily and stool  softeners might help reducing contact excoriation related bleeding.  Patient also advised to do 2-3 sitz baths per day.  Patient will follow up with Dr. Marcello Moores in 2 weeks.  6.  Chronic diastolic CHF: Hypovolemic/hypotensive on presentation with AKI requiring IV hydration.  Now renal function normalized and off IV fluids.  Echo in February showed preserved EF.  May remain off losartan for now, follow-up PCP and cardiology as outpatient before resuming to ensure blood pressure tolerance.  7.  History of hypertension with hypovolemic hypotension: Present on admission.  Home medications including diltiazem, labetalol and losartan were held on admission now resumed on labetalol/Cardizem home dose.  Losartan remains on hold.  .    8. Hypothyroidism: Resume home medications  Discharge Exam:   Vitals:   03/23/20 0527 03/23/20 1443 03/23/20 2132 03/24/20 0501  BP: 131/71 118/74 128/80 126/60  Pulse: 98 98 (!) 104 (!) 108  Resp: 18 20 18 18   Temp: 99.2 F (37.3 C) 98.5 F (36.9 C) 98.4 F (36.9 C) 99.2 F (37.3 C)  TempSrc: Oral  Oral Oral  SpO2: 93% 98% 93% 96%  Weight: 67.5 kg   69.8 kg  Height:        General: Pt is alert, awake, not in acute distress Cardiovascular: Irregular, S1/S2 +, no rubs, no gallops Respiratory: CTA bilaterally, no wheezing, no rhonchi Abdominal: Soft, NT, ND, bowel sounds + Extremities: no edema, no cyanosis  Discharge Condition:Stable CODE STATUS: DNR Diet recommendation: Heart healthy Recommendations for Outpatient Follow-up:  1. Follow up with PCP: At skilled nursing facility in 2 days 2. Follow up with consultants: Cardiology and GS Dr  Thomas in 2 weeks 3. Please obtain follow up labs including: H&H daily for next 3 to 4 days, transfuse less than 7.5 as discussed above.    Discharge Instructions:  Discharge Instructions    Call MD for:  difficulty breathing, headache or visual disturbances   Complete by: As directed    Call MD for:  extreme  fatigue   Complete by: As directed    Call MD for:  persistant dizziness or light-headedness   Complete by: As directed    Call MD for:  persistant nausea and vomiting   Complete by: As directed    Call MD for:  severe uncontrolled pain   Complete by: As directed    Call MD for:  temperature >100.4   Complete by: As directed    Diet - low sodium heart healthy   Complete by: As directed    Increase activity slowly   Complete by: As directed    Increase activity slowly   Complete by: As directed    No wound care   Complete by: As directed      Allergies as of 03/24/2020      Reactions   Penicillins Anaphylaxis   Has patient had a PCN reaction causing immediate rash, facial/tongue/throat swelling, SOB or lightheadedness with hypotension: Yes Has patient had a PCN reaction causing severe rash involving mucus membranes or skin necrosis: No Has patient had a PCN reaction that required hospitalization No Has patient had a PCN reaction occurring within the last 10 years: No If all of the above answers are "NO", then may proceed with Cephalosporin use.   Statins Other (See Comments)   REACTION: elevated LFT's   Tylenol [acetaminophen] Other (See Comments)   If taken with Vytorin at risk for liver damage   Adhesive [tape] Other (See Comments)   Irritates skin   Amiodarone Other (See Comments)   unk   Quinolones Other (See Comments)   UNk   Tape Other (See Comments)   Redness, Please use "paper" tape only.   Codeine Other (See Comments)   feel funny, head is fuzzy      Medication List    STOP taking these medications   hydrocortisone 25 MG suppository Commonly known as: ANUSOL-HC   losartan 25 MG tablet Commonly known as: COZAAR   oxyCODONE 5 MG immediate release tablet Commonly known as: Oxy IR/ROXICODONE   polyethylene glycol 17 g packet Commonly known as: MIRALAX / GLYCOLAX     TAKE these medications   acetaminophen 500 MG tablet Commonly known as: TYLENOL Take  1,000 mg by mouth every 6 (six) hours as needed for mild pain.   apixaban 5 MG Tabs tablet Commonly known as: Eliquis Take 1 tablet (5 mg total) by mouth 2 (two) times daily.   diltiazem 120 MG 24 hr capsule Commonly known as: CARDIZEM CD Take 1 capsule (120 mg total) by mouth daily. Hold SBP<100, HR <60 What changed:   when to take this  additional instructions   feeding supplement (ENSURE ENLIVE) Liqd Take 237 mLs by mouth 2 (two) times daily between meals.   Gerhardt's butt cream Crea Apply 1 application topically 3 (three) times daily.   hydrocortisone 2.5 % rectal cream Commonly known as: ANUSOL-HC Place rectally 3 (three) times daily.   hydrOXYzine 10 MG tablet Commonly known as: ATARAX/VISTARIL Take 1 tablet (10 mg total) by mouth 3 (three) times daily as needed for anxiety.   labetalol 300 MG tablet Commonly known as: NORMODYNE TAKE 1  TABLET BY MOUTH IN THE MORNING AND 1 & 1/2 TABLET IN THE EVENING. Please make yearly appt with Dr. Caryl Comes for May before anymore refills. 1st attempt What changed:   how to take this  when to take this  additional instructions   levothyroxine 25 MCG tablet Commonly known as: SYNTHROID Take 25 mcg by mouth daily before breakfast.   lidocaine 4 % cream Commonly known as: LMX Apply topically 2 (two) times daily as needed (rectal pain).   polyvinyl alcohol 1.4 % ophthalmic solution Commonly known as: LIQUIFILM TEARS Place 1 drop into both eyes as needed for dry eyes.   psyllium 95 % Pack Commonly known as: HYDROCIL/METAMUCIL Take 1 packet by mouth daily. Start taking on: March 25, 2020       Follow-up Information    Leighton Ruff, MD Follow up on 04/13/2020.   Specialty: General Surgery Why: 1:30pm, arrive by 1:00pm for paperwork and check in process.  please bring photo ID and insurance card Contact information: [address] [place] [address] [phone]              Allergies  Allergen  Reactions  . Penicillins Anaphylaxis    Has patient had a PCN reaction causing immediate rash, facial/tongue/throat swelling, SOB or lightheadedness with hypotension: Yes Has patient had a PCN reaction causing severe rash involving mucus membranes or skin necrosis: No Has patient had a PCN reaction that required hospitalization No Has patient had a PCN reaction occurring within the last 10 years: No If all of the above answers are "NO", then may proceed with Cephalosporin use.   . Statins Other (See Comments)    REACTION: elevated LFT's  . Tylenol [Acetaminophen] Other (See Comments)    If taken with Vytorin at risk for liver damage  . Adhesive [Tape] Other (See Comments)    Irritates skin  . Amiodarone Other (See Comments)    unk  . Quinolones Other (See Comments)    UNk  . Tape Other (See Comments)    Redness, Please use "paper" tape only.  . Codeine Other (See Comments)    feel funny, head is fuzzy      The results of significant diagnostics from this hospitalization (including imaging, microbiology, ancillary and laboratory) are listed below for reference.    Labs: BNP (last 3 results) No results for input(s): BNP in the last 8760 hours. Basic Metabolic Panel: Recent Labs  Lab 03/18/20 0527 03/19/20 0431 03/20/20 0507  NA 140 138 136  K 3.9 4.0 3.8  CL 111 108 106  CO2 21* 20* 21*  GLUCOSE 70 66* 80  BUN 15 14 12   CREATININE 0.92 0.87 0.87  CALCIUM 9.0 8.8* 8.6*  MG 1.7 2.2  --    Liver Function Tests: No results for input(s): AST, ALT, ALKPHOS, BILITOT, PROT, ALBUMIN in the last 168 hours. No results for input(s): LIPASE, AMYLASE in the last 168 hours. No results for input(s): AMMONIA in the last 168 hours. CBC: Recent Labs  Lab 03/19/20 0431 03/19/20 1714 03/20/20 0507 03/20/20 1713 03/21/20 1633 03/22/20 0300 03/22/20 1641 03/23/20 0514 03/24/20 0458  WBC 9.3  --  6.0  --   --  5.9  --  5.5 5.1  NEUTROABS 7.3  --   --   --   --   --   --   --    --   HGB 9.3*   < > 9.4*   < > 9.0* 9.1* 9.2* 8.8* 8.5*  HCT 29.3*   < > 30.2*   < > 29.0* 28.5* 30.0* 27.8* 27.6*  MCV 93.6  --  93.5  --   --  92.2  --  93.0 94.5  PLT 233  --  237  --   --  283  --  267 297   < > = values in this interval not displayed.   Cardiac Enzymes: No results for input(s): CKTOTAL, CKMB, CKMBINDEX, TROPONINI in the last 168 hours. BNP: Invalid input(s): POCBNP CBG: Recent Labs  Lab 03/20/20 1502  GLUCAP 109*   D-Dimer No results for input(s): DDIMER in the last 72 hours. Hgb A1c No results for input(s): HGBA1C in the last 72 hours. Lipid Profile No results for input(s): CHOL, HDL, LDLCALC, TRIG, CHOLHDL, LDLDIRECT in the last 72 hours. Thyroid function studies No results for input(s): TSH, T4TOTAL, T3FREE, THYROIDAB in the last 72 hours.  Invalid input(s): FREET3 Anemia work up No results for input(s): VITAMINB12, FOLATE, FERRITIN, TIBC, IRON, RETICCTPCT in the last 72 hours. Urinalysis    Component Value Date/Time   COLORURINE YELLOW 03/17/2020 1125   APPEARANCEUR CLEAR 03/17/2020 1125   LABSPEC 1.013 03/17/2020 1125   PHURINE 5.0 03/17/2020 1125   GLUCOSEU NEGATIVE 03/17/2020 1125   HGBUR MODERATE (A) 03/17/2020 1125   BILIRUBINUR NEGATIVE 03/17/2020 1125   KETONESUR NEGATIVE 03/17/2020 1125   PROTEINUR 30 (A) 03/17/2020 1125   UROBILINOGEN 0.2 01/14/2016 1337   NITRITE NEGATIVE 03/17/2020 1125   LEUKOCYTESUR LARGE (A) 03/17/2020 1125   Sepsis Labs Invalid input(s): PROCALCITONIN,  WBC,  LACTICIDVEN Microbiology Recent Results (from the past 240 hour(s))  SARS Coronavirus 2 by RT PCR (hospital order, performed in [place] hospital lab) Nasopharyngeal Nasopharyngeal Swab     Status: None   Collection Time: 03/16/20  8:42 PM   Specimen: Nasopharyngeal Swab  Result Value Ref Range Status   SARS Coronavirus 2 NEGATIVE NEGATIVE Final    Comment: (NOTE) SARS-CoV-2 target nucleic acids are NOT DETECTED.  The SARS-CoV-2 RNA is generally  detectable in upper and lower respiratory specimens during the acute phase of infection. The lowest concentration of SARS-CoV-2 viral copies this assay can detect is 250 copies / mL. A negative result does not preclude SARS-CoV-2 infection and should not be used as the sole basis for treatment or other patient management decisions.  A negative result may occur with improper specimen collection / handling, submission of specimen other than nasopharyngeal swab, presence of viral mutation(s) within the areas targeted by this assay, and inadequate number of viral copies (<250 copies / mL). A negative result must be combined with clinical observations, patient history, and epidemiological information.  Fact Sheet for Patients:   StrictlyIdeas.no  Fact Sheet for Healthcare Providers: BankingDealers.co.za  This test is not yet approved or  cleared by the [place] FDA and has been authorized for detection and/or diagnosis of SARS-CoV-2 by FDA under an Emergency Use Authorization (EUA).  This EUA will remain in effect (meaning this test can be used) for the duration of the COVID-19 declaration under Section 564(b)(1) of the Act, 21 U.S.C. section 360bbb-3(b)(1), unless the authorization is terminated or revoked sooner.  Performed at [facility], [place] [address], [place], [place] [address]   Culture, Urine     Status: Abnormal   Collection Time: 03/17/20 11:25 AM   Specimen: Urine, Clean Catch  Result Value Ref Range Status   Specimen Description   Final    URINE, CLEAN CATCH Performed at [facility]  [facility], [place] [address], [place], [place] [address]    Special Requests   Final    NONE Performed at [facility], [place] [address], [place], [place] [address]    Culture >=100,000 COLONIES/mL ESCHERICHIA COLI (A)  Final   Report Status 03/19/2020 FINAL  Final   Organism ID, Bacteria  ESCHERICHIA COLI (A)  Final      Susceptibility   Escherichia coli - MIC*    AMPICILLIN <=2 SENSITIVE Sensitive     CEFAZOLIN <=4 SENSITIVE Sensitive     CEFTRIAXONE <=0.25 SENSITIVE Sensitive     CIPROFLOXACIN <=0.25 SENSITIVE Sensitive     GENTAMICIN <=1 SENSITIVE Sensitive     IMIPENEM <=0.25 SENSITIVE Sensitive     NITROFURANTOIN <=16 SENSITIVE Sensitive     TRIMETH/SULFA <=20 SENSITIVE Sensitive     AMPICILLIN/SULBACTAM <=2 SENSITIVE Sensitive     PIP/TAZO <=4 SENSITIVE Sensitive     * >=100,000 COLONIES/mL ESCHERICHIA COLI  SARS Coronavirus 2 by RT PCR (hospital order, performed in [place] hospital lab) Nasopharyngeal Nasopharyngeal Swab     Status: None   Collection Time: 03/24/20 11:12 AM   Specimen: Nasopharyngeal Swab  Result Value Ref Range Status   SARS Coronavirus 2 NEGATIVE NEGATIVE Final    Comment: (NOTE) SARS-CoV-2 target nucleic acids are NOT DETECTED.  The SARS-CoV-2 RNA is generally detectable in upper and lower respiratory specimens during the acute phase of infection. The lowest concentration of SARS-CoV-2 viral copies this assay can detect is 250 copies / mL. A negative result does not preclude SARS-CoV-2 infection and should not be used as the sole basis for treatment or other patient management decisions.  A negative result may occur with improper specimen collection / handling, submission of specimen other than nasopharyngeal swab, presence of viral mutation(s) within the areas targeted by this assay, and inadequate number of viral copies (<250 copies / mL). A negative result must be combined with clinical observations, patient history, and epidemiological information.  Fact Sheet for Patients:   StrictlyIdeas.no  Fact Sheet for Healthcare Providers: BankingDealers.co.za  This test is not yet approved or  cleared by the [place] FDA and has been authorized for detection and/or diagnosis of  SARS-CoV-2 by FDA under an Emergency Use Authorization (EUA).  This EUA will remain in effect (meaning this test can be used) for the duration of the COVID-19 declaration under Section 564(b)(1) of the Act, 21 U.S.C. section 360bbb-3(b)(1), unless the authorization is terminated or revoked sooner.  Performed at [facility], [place] [address], [place],  [address]     Procedures/Studies: US RENAL  Result Date: 03/17/2020 CLINICAL DATA:  86 year old female with acute kidney injury. EXAM: RENAL / URINARY TRACT ULTRASOUND COMPLETE COMPARISON:  04/22/2016 ultrasound FINDINGS: Right Kidney: Renal measurements: 10.8 x 5.3 x 5.4 cm = volume: 160 mL. Moderate cortical atrophy noted. A 1.9 cm UPPER pole cyst is present. No solid mass or hydronephrosis identified. Echogenicity is within normal limits. Left Kidney: Renal measurements: 11.7 x 6.1 x 5.5 cm = volume: 202 mL. Moderate cortical atrophy noted. Echogenicity within normal limits. No mass or hydronephrosis visualized. Bladder: Appears normal for degree of bladder distention. Other: None. IMPRESSION: 1. Moderate bilateral renal cortical atrophy. 2. No evidence of hydronephrosis. Electronically Signed   By: Margarette Canada M.D.   On: 03/17/2020 07:59   DG Abd Portable 1V  Result Date: 03/22/2020 CLINICAL DATA:  Constipation EXAM: PORTABLE ABDOMEN - 1 VIEW COMPARISON:  None. FINDINGS: Bowel gas pattern is  unremarkable. Stool burden is mild. No acute osseous abnormality. IMPRESSION: Normal bowel gas pattern.  Mild stool burden. Electronically Signed   By: Macy Mis M.D.   On: 03/22/2020 18:57   DG HIP UNILAT WITH PELVIS 2-3 VIEWS RIGHT  Result Date: 03/18/2020 CLINICAL DATA:  Acute right hip pain after surgery. EXAM: DG HIP (WITH OR WITHOUT PELVIS) 2-3V RIGHT COMPARISON:  None. FINDINGS: There is no evidence of hip fracture or dislocation. No significant joint space narrowing is noted. Mild osteophyte formation is noted involving  the acetabulum. IMPRESSION: Mild degenerative changes are noted. No acute abnormality is noted. Electronically Signed   By: Marijo Conception M.D.   On: 03/18/2020 11:34    Time coordinating discharge: Over 30 minutes  SIGNED:   Guilford Shi, MD  [facility] 03/24/2020, 1:01 PM

## 2020-03-27 ENCOUNTER — Other Ambulatory Visit (HOSPITAL_COMMUNITY): Payer: Medicare PPO

## 2020-03-31 ENCOUNTER — Telehealth: Payer: Self-pay | Admitting: Internal Medicine

## 2020-03-31 NOTE — Telephone Encounter (Signed)
Spoke with pt who states she was discharged from hospital and taken off her Lasix, spironolactone,Welchol and Losartan.  Pt is concerned these medications were discontinued.  Appointment scheduled with Tommye Standard, PA-C for 04/04/2020 at 1145am for hospital f/u.  Pt verbalizes understanding and agrees with current plan.

## 2020-03-31 NOTE — Telephone Encounter (Signed)
Pt c/o medication issue:  1. Name of Medication:  apixaban (ELIQUIS) 5 MG TABS tablet diltiazem (CARDIZEM CD) 120 MG 24 hr capsule Carvediolol   2. How are you currently taking this medication (dosage and times per day)?   3. Are you having a reaction (difficulty breathing--STAT)?   4. What is your medication issue? Pt was in the hospital and the  Hospital staff adjusted her medications. The staff reduced her medications to just the ones above. She wanted to make sure Dr. Caryl Comes is OK with the changes made. Please advise

## 2020-04-02 NOTE — Progress Notes (Signed)
Cardiology Office Note Date:  04/02/2020  Patient ID:  [name] [name], [name] [name] 1930/10/11, MRN [id number] PCP:  [name], MD  Cardiologist/Electrophysiologist: Dr. Caryl Comes    Chief Complaint:  post hospital  History of Present Illness: [name] [name] is a 86 y.o. female with history of HTN, stroke, hypothyroidism, AFib (permanent), SSSx w/PPM, chronic CHF (diastolic), non-obstructive CAD, PMR, CKD (III)  She comes in to be seen for Dr. Caryl Comes, last seen by him April 2021 via tele health, had developed edema and taken additional diurectics resulting on some orthostatic dizziness, recommended lasix QOD  She was recently hospitalized with LGIB (post colonoscopy hemorrhoidectomy), AKI, and UTI, her Eliquis held discharged 03/24/2020 to SNF back on her Eliquis.  TODAY She is accompanied by her husband. Cardiac-wise she reports feeling "fine", no cardiac awareness, no CP or palpitations No CP, no dizzy spells, near syncope or syncope. No SOB or overt DOE.  She is frustrated by what she feels is a slow recover from her GIB, silll generally weak and without energy. Wants to be back to her baseline faster. She saw her PMD yesterday had labs, they have not yet called her with results  She denies any ongoing bleedin or stool changes  She noted today that she was taking the diltiazem BID when was Rx only daily. She has developed significant ankle/LE swelling and resumed her prior lasix dose (69m daily) yesterday and did not an up-tick in urine OP.  We reviewed her meds from her last couple hospital stays Her prior diltiazem was written for 1244mBID and now after the last reduced to daily and she had not realized that Her furosemide was stopped at her August hospital discharge   Device information MDT dual chamber PPM, implanted 2013 RV lead is a StJ  lead  Past Medical History:  Diagnosis Date   Anemia due to chronic blood loss 12/2019   rectal bleeding   Bilateral lower extremity  edema    CAD (coronary artery disease) cardiologist-- dr klCaryl Comes a. LHC 10/2017 30% RCA otherwise OK.  (nonobstructive cad)   Chronic diastolic CHF (congestive heart failure) ([facility]   followed by dr klCaryl Comes Chronic venous insufficiency    Hemorrhoids    History of adenomatous polyp of colon    History of hemorrhagic cerebrovascular accident (CVA) without residual deficits 03/25/2015   (previous seen by neuologist--- dr xuErlinda Hong ICH left posterior parietal in setting  HTN/ and pt taking coumadin;   per pt no residual   History of kidney stones    History of recurrent UTIs    HLD (hyperlipidemia)    HTN (hypertension)    followed by dr klCaryl Comesnd pcp   Hypothyroidism    followed by pcp   NASH (nonalcoholic steatohepatitis)    hx medication induced hepatitis (vytorin per pt)   OA (osteoarthritis)    Pacemaker-Medtronic 07/29/2011  implanted   followed by dr klCaryl Comes [facility] dr klCaryl Comes- hx several DCCVs   PMR (polymyalgia rheumatica) ([facility]   03-14-2020  per pt has not had any issues in several years   Rectal bleeding    sinus node dysfunction//post termination pauses    SSS (sick sinus syndrome) ([facility]   s/p  PPM 07-29-2011   Thoracic aortic aneurysm (TAA) ([facility]   a. followed by Dr. GeServando Snare   Past Surgical History:  Procedure Laterality Date   BIOPSY  02/23/2020  Procedure: BIOPSY;  Surgeon: Clarene Essex, MD;  Location: [facility];  Service: Endoscopy;;   CARDIAC CATHETERIZATION  12-05-2006  dr cooper   nonobstructive cad   CARDIAC PACEMAKER PLACEMENT  07-29-2011   dr Valaria Good (dual)   CARDIOVERSION  03/02/2012   Procedure: CARDIOVERSION;  Surgeon: Lelon Perla, MD;  Location: [place];  Service: Cardiovascular;  Laterality: N/A;   CARDIOVERSION  03/13/2012   Procedure: CARDIOVERSION;  Surgeon: Josue Hector, MD;  Location: [place];  Service: Cardiovascular;  Laterality: N/A;    CARDIOVERSION  04/15/2012   Procedure: CARDIOVERSION;  Surgeon: [name], MD;  Location: [place];  Service: Cardiovascular;  Laterality: N/A;   CARDIOVERSION N/A 06/07/2014   Procedure: CARDIOVERSION;  Surgeon: [name], MD;  Location: [facility] ENDOSCOPY;  Service: Cardiovascular;  Laterality: N/A;   CARDIOVERSION N/A 08/18/2014   Procedure: CARDIOVERSION;  Surgeon: [name], MD;  Location: [place];  Service: Cardiovascular;  Laterality: N/A;   CARDIOVERSION N/A 09/06/2015   Procedure: CARDIOVERSION;  Surgeon: Jerline Pain, MD;  Location: [place];  Service: Cardiovascular;  Laterality: N/A;   COLONOSCOPY N/A 02/23/2020   Procedure: COLONOSCOPY possible flex sig only;  Surgeon: Clarene Essex, MD;  [facility];  Service: Endoscopy;  Laterality: N/A;   CYSTOSCOPY WITH RETROGRADE PYELOGRAM, URETEROSCOPY AND STENT PLACEMENT Right 10/ 2017  @[facility]    CYSTOSCOPY WITH STENT PLACEMENT Right 04/22/2016   Procedure: CYSTOSCOPY WITH RIGHT URETERAL STENT INSERTION;  Surgeon: [name], MD;  Location: [place];  Service: Urology;  Laterality: Right;   CYSTOSCOPY WITH URETEROSCOPY, STONE BASKETRY AND STENT PLACEMENT Right 04/30/2016   Procedure: CYSTOSCOPY WITH URETEROSCOPY, STENT PLACEMENT REMOVAL;  Surgeon: [name], MD;  [facility]: [facility];  Service: Urology;  Laterality: Right;   HEMORRHOID SURGERY N/A 03/15/2020   Procedure: 3 COLUMN HEMORRHOIDECTOMY;  Surgeon: Leighton Ruff, MD;  Location: [place];  Service: General;  Laterality: N/A;   [place] CATH AND CORONARY ANGIOGRAPHY N/A 10/28/2017   Procedure: LEFT HEART CATH AND CORONARY ANGIOGRAPHY;  Surgeon: Lorretta Harp, MD;  Location: [place] CV LAB;  Service: Cardiovascular;  Laterality: N/A;   PERMANENT PACEMAKER INSERTION N/A 07/29/2011   Procedure: PERMANENT PACEMAKER INSERTION;  Surgeon: [name], MD;  [facility];   Service: Cardiovascular;  Laterality: N/A;   UMBILICAL HERNIA REPAIR  1950's    Current Outpatient Medications  Medication Sig Dispense Refill   acetaminophen (TYLENOL) 500 MG tablet Take 1,000 mg by mouth every 6 (six) hours as needed for mild pain.     apixaban (ELIQUIS) 5 MG TABS tablet Take 1 tablet (5 mg total) by mouth 2 (two) times daily. 180 tablet 1   diltiazem (CARDIZEM CD) 120 MG 24 hr capsule Take 1 capsule (120 mg total) by mouth daily. Hold SBP<100, HR <60 180 capsule 2   feeding supplement, ENSURE ENLIVE, (ENSURE ENLIVE) LIQD Take 237 mLs by mouth 2 (two) times daily between meals. 237 mL 12   hydrocortisone (ANUSOL-HC) 2.5 % rectal cream Place rectally 3 (three) times daily. (Patient not taking: Reported on 03/14/2020) 30 g 0   hydrOXYzine (ATARAX/VISTARIL) 10 MG tablet Take 1 tablet (10 mg total) by mouth 3 (three) times daily as needed for anxiety. 30 tablet 0   labetalol (NORMODYNE) 300 MG tablet TAKE 1 TABLET BY MOUTH IN THE MORNING AND 1 & 1/2 TABLET IN THE EVENING. Please make yearly appt with Dr.  Caryl Comes for May before anymore refills. 1st attempt (Patient taking differently: Take by mouth See admin instructions. TAKE 1 TABLET BY MOUTH IN THE MORNING AND 1 & 1/2 TABLET IN THE EVENING. appt) 225 tablet 1   levothyroxine (SYNTHROID, LEVOTHROID) 25 MCG tablet Take 25 mcg by mouth daily before breakfast.      lidocaine (LMX) 4 % cream Apply topically 2 (two) times daily as needed (rectal pain). 30 g 0   Nystatin (GERHARDT'S BUTT CREAM) CREA Apply 1 application topically 3 (three) times daily.     polyvinyl alcohol (LIQUIFILM TEARS) 1.4 % ophthalmic solution Place 1 drop into both eyes as needed for dry eyes. 15 mL 0   psyllium (HYDROCIL/METAMUCIL) 95 % PACK Take 1 packet by mouth daily. 240 each    No current facility-administered medications for this visit.    Allergies:   Penicillins, Statins, Tylenol [acetaminophen], Adhesive [tape], Amiodarone, Quinolones, Tape,  and Codeine   Social History:  The patient  reports that she quit smoking about 29 years ago. Her smoking use included cigarettes. She quit after 20.00 years of use. She has never used smokeless tobacco. She reports that she does not drink alcohol and does not use drugs.   Family History:  The patient's family history includes Alzheimer's disease in an other family member; Coronary artery disease in her brother, brother, mother, and another family member.  ROS:  Please see the history of present illness.    All other systems are reviewed and otherwise negative.   PHYSICAL EXAM:  VS:  There were no vitals taken for this visit. BMI: There is no height or weight on file to calculate BMI. Well nourished, well developed, in no acute distress HEENT: normocephalic, atraumatic Neck: no JVD, carotid bruits or masses Cardiac:  RRR; no significant murmurs, no rubs, or gallops Lungs:  CTA b/l, no wheezing, rhonchi or rales Abd: soft, nontender MS: no deformity, age appropriate atrophy Ext: 1++ edema Skin: warm and dry, no rash Neuro:  No gross deficits appreciated Psych: euthymic mood, full affect  PPM site is stable, no tethering or discomfort   EKG:  Not done today  PPM interrogation done today and reviewed by myself: Battery and lead measurements are good HR generally 60-100, most 80's, some 100-120 VP 31.9%   Echo 09/09/19: 1. Left ventricular ejection fraction, by estimation, is 55 to 60%. The  left ventricle has normal function. The left ventricle has no regional  wall motion abnormalities. Left ventricular diastolic function could not  be evaluated. Left ventricular  diastolic function could not be evaluated.  2. Right ventricular systolic function is normal. The right ventricular  size is mildly enlarged. There is mildly elevated pulmonary artery  systolic pressure. The estimated right ventricular systolic pressure is  47.4 mmHg.  3. Left atrial size was severely dilated.   4. The mitral valve is grossly normal. Mild mitral valve regurgitation.  No evidence of mitral stenosis.  5. Tricuspid valve regurgitation is mild to moderate.  6. The aortic valve is tricuspid. Aortic valve regurgitation is not  visualized. Mild to moderate aortic valve sclerosis/calcification is  present, without any evidence of aortic stenosis.  7. Aneurysm of the ascending aorta, measuring 46 mm.  8. The inferior vena cava is normal in size with greater than 50%  respiratory variability, suggesting right atrial pressure of 3 mmHg.   Recent Labs: 02/21/2020: ALT 13 03/19/2020: Magnesium 2.2; TSH 1.587 03/20/2020: BUN 12; Creatinine, Ser 0.87; Potassium 3.8; Sodium 136 03/24/2020: Hemoglobin 9.4;  Platelets 297  No results found for requested labs within last 8760 hours.   Estimated Creatinine Clearance: 44.5 mL/min (by C-G formula based on SCr of 0.87 mg/dL).   Wt Readings from Last 3 Encounters:  03/24/20 153 lb 14.1 oz (69.8 kg)  03/15/20 147 lb 1.6 oz (66.7 kg)  02/22/20 153 lb 10.6 oz (69.7 kg)     Other studies reviewed: Additional studies/records reviewed today include: summarized above  ASSESSMENT AND PLAN:  1. PPM     Intact function, no programming changes made  2. AFib (permanent)     CHA2DS2Vasc is 7, on eliquis appropriately dosed     Has some faster rates, mostly controlled  3. Chronic CHF (diastolic)     Edema has returned off her furosemide     No SOB  4. HTN     A recheck by myself is 112/60   She has had 2 recent hospitalizations with GIB Hgb in march was 12 >> aug hospital stay  Down to 7.0 >> Sep stay 10.2 > 7.6, recovered to  8.5-9.4 Discussed that it will take time to re-condition after 2 lengthy hospital stays and her marked anemia. She had labs yesterday with her PMD and will follow up there   I think she has BP to stay on BID diltiazem her HR generally controlled with this, and her last creat 0.87 should tolerate the lasix, will check a  BMET in a week    Disposition: F/u with [place] in a month, sooner if needed.  I have staff messaged my MA to call the pt and call in [phone] [phone] daily with the resumption of her lasix, not discussed while she was here today  Current medicines are reviewed at length with the patient today.  The patient did not have any concerns regarding medicines.  SVenetia Night PA-C 04/02/2020 8:09 AM     [place] [phone](office)  [phone](fax)

## 2020-04-03 DIAGNOSIS — D500 Iron deficiency anemia secondary to blood loss (chronic): Secondary | ICD-10-CM | POA: Diagnosis not present

## 2020-04-03 DIAGNOSIS — F329 Major depressive disorder, single episode, unspecified: Secondary | ICD-10-CM | POA: Diagnosis not present

## 2020-04-03 DIAGNOSIS — K649 Unspecified hemorrhoids: Secondary | ICD-10-CM | POA: Diagnosis not present

## 2020-04-03 DIAGNOSIS — N178 Other acute kidney failure: Secondary | ICD-10-CM | POA: Diagnosis not present

## 2020-04-04 ENCOUNTER — Ambulatory Visit (INDEPENDENT_AMBULATORY_CARE_PROVIDER_SITE_OTHER): Payer: Medicare PPO | Admitting: Physician Assistant

## 2020-04-04 ENCOUNTER — Other Ambulatory Visit: Payer: Self-pay

## 2020-04-04 VITALS — BP 102/44 | HR 96 | Ht 65.0 in | Wt 150.0 lb

## 2020-04-04 DIAGNOSIS — I10 Essential (primary) hypertension: Secondary | ICD-10-CM | POA: Diagnosis not present

## 2020-04-04 DIAGNOSIS — I5032 Chronic diastolic (congestive) heart failure: Secondary | ICD-10-CM

## 2020-04-04 DIAGNOSIS — Z950 Presence of cardiac pacemaker: Secondary | ICD-10-CM | POA: Diagnosis not present

## 2020-04-04 DIAGNOSIS — I4821 Permanent atrial fibrillation: Secondary | ICD-10-CM | POA: Diagnosis not present

## 2020-04-04 MED ORDER — FUROSEMIDE 40 MG PO TABS: 60.0000 mg | ORAL_TABLET | Freq: Every day | ORAL | 1 refills | Status: DC

## 2020-04-04 MED ORDER — DILTIAZEM HCL ER COATED BEADS 120 MG PO CP24: 120.0000 mg | ORAL_CAPSULE | Freq: Two times a day (BID) | ORAL | 1 refills | Status: AC

## 2020-04-04 NOTE — Patient Instructions (Addendum)
Medication Instructions:   START TAKING DILTIAZEM 120 MG TWICE A DAY   START TAKING FUROSEMIDE 60 MG ( A TABLET AND HALF ONCE A DAY )  *If you need a refill on your cardiac medications before your next appointment, please call your pharmacy*   Lab Work: RETURN IN 7- [place] BMET   If you have labs (blood work) drawn today and your tests are completely normal, you will receive your results only by: Marland Kitchen MyChart Message (if you have MyChart) OR . A paper copy in the mail If you have any lab test that is abnormal or we need to change your treatment, we will call you to review the results.   Testing/Procedures:NONE ORDERED  TODAY   Follow-Up: At [facility], you and your health needs are our priority.  As part of our continuing mission to provide you with exceptional heart care, we have created designated Provider Care Teams.  These Care Teams include your primary Cardiologist (physician) and Advanced Practice Providers (APPs -  Physician Assistants and Nurse Practitioners) who all work together to provide you with the care you need, when you need it.  We recommend signing up for the patient portal called "MyChart".  Sign up information is provided on this After Visit Summary.  MyChart is used to connect with patients for Virtual Visits (Telemedicine).  Patients are able to view lab/test results, encounter notes, upcoming appointments, etc.  Non-urgent messages can be sent to your provider as well.   To learn more about what you can do with MyChart, go to [email or web address].    Your next appointment:   4-6 week(s)  The format for your next appointment:   In Person  Provider:   You will see one of the following Advanced Practice Providers on your designated Care Team:    Tommye Standard, [place]      Other Instructions

## 2020-04-06 ENCOUNTER — Other Ambulatory Visit: Payer: Self-pay | Admitting: *Deleted

## 2020-04-06 ENCOUNTER — Telehealth: Payer: Self-pay | Admitting: *Deleted

## 2020-04-06 MED ORDER — POTASSIUM CHLORIDE ER 10 MEQ PO TBCR: 10.0000 meq | EXTENDED_RELEASE_TABLET | Freq: Every day | ORAL | 1 refills | Status: DC

## 2020-04-06 NOTE — Telephone Encounter (Signed)
Spoke with patient and aware of medication recommendation to start potassium 10 meq since taking furosemide 60 mg daily

## 2020-04-10 ENCOUNTER — Telehealth: Payer: Self-pay | Admitting: Physician Assistant

## 2020-04-10 DIAGNOSIS — E876 Hypokalemia: Secondary | ICD-10-CM

## 2020-04-10 NOTE — Telephone Encounter (Signed)
Pt c/o swelling: STAT is pt has developed SOB within 24 hours  1) How much weight have you gained and in what time span? Not sure  2) If swelling, where is the swelling located? Feet and ankles  3) Are you currently taking a fluid pill?  Yes  4) Are you currently SOB? No  5) Do you have a log of your daily weights (if so, list)? No  6) Have you gained 3 pounds in a day or 5 pounds in a week? NOT SURE  7) Have you traveled recently? no

## 2020-04-10 NOTE — Telephone Encounter (Signed)
Spoke with pt who states she continues to have swelling in her feet and ankles despite starting on the Furosemide 15m daily after seeing PA-C on 04/04/2020.  Pt reports she is also wearing her compression hose and elevating feet and legs while sitting.  Pt denies current SOB and CP.  Reviewed ED precautions with pt and advised will forward information to RTommye StandardPA-C for review and any further recommendations.  Pt verbalizes understanding and agrees with current plan.

## 2020-04-12 ENCOUNTER — Other Ambulatory Visit: Payer: Self-pay

## 2020-04-12 ENCOUNTER — Other Ambulatory Visit: Payer: Medicare PPO | Admitting: *Deleted

## 2020-04-12 DIAGNOSIS — M25561 Pain in right knee: Secondary | ICD-10-CM | POA: Diagnosis not present

## 2020-04-12 DIAGNOSIS — M25562 Pain in left knee: Secondary | ICD-10-CM | POA: Diagnosis not present

## 2020-04-12 DIAGNOSIS — M25551 Pain in right hip: Secondary | ICD-10-CM | POA: Diagnosis not present

## 2020-04-12 DIAGNOSIS — I4821 Permanent atrial fibrillation: Secondary | ICD-10-CM | POA: Diagnosis not present

## 2020-04-12 DIAGNOSIS — M25552 Pain in left hip: Secondary | ICD-10-CM | POA: Diagnosis not present

## 2020-04-12 DIAGNOSIS — I5032 Chronic diastolic (congestive) heart failure: Secondary | ICD-10-CM | POA: Diagnosis not present

## 2020-04-12 DIAGNOSIS — M7061 Trochanteric bursitis, right hip: Secondary | ICD-10-CM | POA: Diagnosis not present

## 2020-04-12 DIAGNOSIS — M170 Bilateral primary osteoarthritis of knee: Secondary | ICD-10-CM | POA: Diagnosis not present

## 2020-04-13 LAB — BASIC METABOLIC PANEL
BUN/Creatinine Ratio: 10 — ABNORMAL LOW (ref 12–28)
BUN: 9 mg/dL (ref 8–27)
CO2: 28 mmol/L (ref 20–29)
Calcium: 9.8 mg/dL (ref 8.7–10.3)
Chloride: 100 mmol/L (ref 96–106)
Creatinine, Ser: 0.88 mg/dL (ref 0.57–1.00)
GFR calc Af Amer: 69 mL/min/{1.73_m2} (ref >59)
GFR calc non Af Amer: 60 mL/min/{1.73_m2} (ref >59)
Glucose: 98 mg/dL (ref 65–99)
Potassium: 3.2 mmol/L — ABNORMAL LOW (ref 3.5–5.2)
Sodium: 141 mmol/L (ref 134–144)

## 2020-04-13 NOTE — Telephone Encounter (Signed)
-----   Message from Shellia Cleverly, RN sent at 04/13/2020 11:49 AM EDT ----- Regarding: RE: abnormal labs Please follow standard potassium supplement orders. ----- Message ----- From: Thora Lance, RN Sent: 04/13/2020  10:35 AM EDT To: Jerline Pain, MD, Shellia Cleverly, RN, # Subject: FW: abnormal labs                              Please review, advise and return to triage.  Thank you!  ----- Message ----- From: Thora Lance, RN Sent: 04/13/2020  10:32 AM EDT To: Thora Lance, RN Subject: abnormal labs                                  Please review and advise then send to triage ----- Message ----- From: Interface, [facility] Lab Results In Sent: 04/13/2020  12:36 AM EDT To: [place], PA-C

## 2020-04-13 NOTE — Telephone Encounter (Signed)
Spoke with pt and advised pt of low K+ level.  Pt states she had not been taking K+ supplement until about 2 days ago when she realized she had not been taking as prescribed.  Pt reports swelling in her feet and ankles is much better and she is wearing compression hose.  Pt advised will forward information to Tommye Standard PA-C and Dr Caryl Comes for further direction before advising pt to increase K+ per DOD.  Pt verbalizes understanding and agrees with current plan.

## 2020-04-14 NOTE — Telephone Encounter (Signed)
Spoke with pt and advised per Tommye Standard to take an extra K+ supplement tomorrow (10/02) and Sunday (10/03) and repeat labs on Wednesday between 8 and 430.  Pt verbalizes understanding and agrees with current plan.

## 2020-04-19 ENCOUNTER — Other Ambulatory Visit: Payer: Medicare PPO | Admitting: *Deleted

## 2020-04-19 ENCOUNTER — Other Ambulatory Visit: Payer: Self-pay

## 2020-04-19 DIAGNOSIS — M25561 Pain in right knee: Secondary | ICD-10-CM | POA: Diagnosis not present

## 2020-04-19 DIAGNOSIS — M25562 Pain in left knee: Secondary | ICD-10-CM | POA: Diagnosis not present

## 2020-04-19 DIAGNOSIS — E876 Hypokalemia: Secondary | ICD-10-CM

## 2020-04-19 DIAGNOSIS — M170 Bilateral primary osteoarthritis of knee: Secondary | ICD-10-CM | POA: Diagnosis not present

## 2020-04-20 DIAGNOSIS — J069 Acute upper respiratory infection, unspecified: Secondary | ICD-10-CM | POA: Diagnosis not present

## 2020-04-20 LAB — BASIC METABOLIC PANEL
BUN/Creatinine Ratio: 23 (ref 12–28)
BUN: 17 mg/dL (ref 8–27)
CO2: 27 mmol/L (ref 20–29)
Calcium: 9.5 mg/dL (ref 8.7–10.3)
Chloride: 103 mmol/L (ref 96–106)
Creatinine, Ser: 0.73 mg/dL (ref 0.57–1.00)
GFR calc Af Amer: 86 mL/min/{1.73_m2} (ref >59)
GFR calc non Af Amer: 75 mL/min/{1.73_m2} (ref >59)
Glucose: 97 mg/dL (ref 65–99)
Potassium: 3.7 mmol/L (ref 3.5–5.2)
Sodium: 142 mmol/L (ref 134–144)

## 2020-04-25 DIAGNOSIS — Z23 Encounter for immunization: Secondary | ICD-10-CM | POA: Diagnosis not present

## 2020-04-25 DIAGNOSIS — M549 Dorsalgia, unspecified: Secondary | ICD-10-CM | POA: Diagnosis not present

## 2020-04-27 DIAGNOSIS — M25561 Pain in right knee: Secondary | ICD-10-CM | POA: Diagnosis not present

## 2020-04-27 DIAGNOSIS — M7062 Trochanteric bursitis, left hip: Secondary | ICD-10-CM | POA: Diagnosis not present

## 2020-04-27 DIAGNOSIS — M25562 Pain in left knee: Secondary | ICD-10-CM | POA: Diagnosis not present

## 2020-04-27 DIAGNOSIS — M170 Bilateral primary osteoarthritis of knee: Secondary | ICD-10-CM | POA: Diagnosis not present

## 2020-04-28 ENCOUNTER — Ambulatory Visit (INDEPENDENT_AMBULATORY_CARE_PROVIDER_SITE_OTHER): Payer: Medicare PPO

## 2020-04-28 DIAGNOSIS — I4821 Permanent atrial fibrillation: Secondary | ICD-10-CM

## 2020-04-29 LAB — CUP PACEART REMOTE DEVICE CHECK
Battery Impedance: 1162 Ohm
Battery Remaining Longevity: 67 mo
Battery Voltage: 2.77 V
Brady Statistic RV Percent Paced: 21 %
Date Time Interrogation Session: 20211015094622
Implantable Lead Implant Date: 20130114
Implantable Lead Implant Date: 20130114
Implantable Lead Location: 753859
Implantable Lead Location: 753860
Implantable Lead Model: 1948
Implantable Lead Model: 5076
Implantable Pulse Generator Implant Date: 20130114
Lead Channel Impedance Value: 67 Ohm
Lead Channel Impedance Value: 703 Ohm
Lead Channel Pacing Threshold Amplitude: 0.625 V
Lead Channel Pacing Threshold Pulse Width: 0.4 ms
Lead Channel Setting Pacing Amplitude: 2.5 V
Lead Channel Setting Pacing Pulse Width: 0.4 ms
Lead Channel Setting Sensing Sensitivity: 5.6 mV

## 2020-05-03 ENCOUNTER — Other Ambulatory Visit: Payer: Self-pay

## 2020-05-03 ENCOUNTER — Ambulatory Visit: Payer: Medicare PPO | Admitting: Physician Assistant

## 2020-05-03 VITALS — BP 136/86 | HR 99 | Ht 65.0 in | Wt 145.0 lb

## 2020-05-03 DIAGNOSIS — I5032 Chronic diastolic (congestive) heart failure: Secondary | ICD-10-CM | POA: Diagnosis not present

## 2020-05-03 DIAGNOSIS — I10 Essential (primary) hypertension: Secondary | ICD-10-CM

## 2020-05-03 DIAGNOSIS — Z950 Presence of cardiac pacemaker: Secondary | ICD-10-CM | POA: Diagnosis not present

## 2020-05-03 DIAGNOSIS — I4821 Permanent atrial fibrillation: Secondary | ICD-10-CM

## 2020-05-03 MED ORDER — FUROSEMIDE 40 MG PO TABS: 40.0000 mg | ORAL_TABLET | Freq: Every day | ORAL | 1 refills | Status: AC

## 2020-05-03 NOTE — Patient Instructions (Signed)
Medication Instructions:    START TAKING LASIX 40 MG ONCE A DAY   *If you need a refill on your cardiac medications before your next appointment, please call your pharmacy*   Lab Work: NONE ORDERED  TODAY   If you have labs (blood work) drawn today and your tests are completely normal, you will receive your results only by: Marland Kitchen MyChart Message (if you have MyChart) OR . A paper copy in the mail If you have any lab test that is abnormal or we need to change your treatment, we will call you to review the results.   Testing/Procedures: NONE ORDERED  TODAY   Follow-Up: At [facility], you and your health needs are our priority.  As part of our continuing mission to provide you with exceptional heart care, we have created designated Provider Care Teams.  These Care Teams include your primary Cardiologist (physician) and Advanced Practice Providers (APPs -  Physician Assistants and Nurse Practitioners) who all work together to provide you with the care you need, when you need it.  We recommend signing up for the patient portal called "MyChart".  Sign up information is provided on this After Visit Summary.  MyChart is used to connect with patients for Virtual Visits (Telemedicine).  Patients are able to view lab/test results, encounter notes, upcoming appointments, etc.  Non-urgent messages can be sent to your provider as well.   To learn more about what you can do with MyChart, go to [email or web address].    Your next appointment:   6 month(s)  The format for your next appointment:   In Person  Provider:   You may see Virl Axe, MD or one of the following Advanced Practice Providers on your designated Care Team:    Chanetta Marshall, NP  Tommye Standard, [place]  Legrand Como "Oda Kilts, [place]    Other Instructions

## 2020-05-03 NOTE — Progress Notes (Signed)
Remote pacemaker transmission.   

## 2020-05-03 NOTE — Progress Notes (Signed)
Cardiology Office Note Date:  05/03/2020  Patient ID:  [name] [name], [name] [name] Sep 26, 1930, MRN [id number] PCP:  [name], MD  Cardiologist/Electrophysiologist: Dr. Caryl Comes    Chief Complaint:  F/u medication changes  History of Present Illness: [name] [name] is a 86 y.o. female with history of HTN, stroke, hypothyroidism, AFib (permanent), SSSx w/PPM, chronic CHF (diastolic), non-obstructive CAD, PMR, CKD (III)  She comes in to be seen for Dr. Caryl Comes, last seen by him April 2021 via tele health, had developed edema and taken additional diurectics resulting on some orthostatic dizziness, recommended lasix QOD  She was recently hospitalized with LGIB (post colonoscopy hemorrhoidectomy), AKI, and UTI, her Eliquis held discharged 03/24/2020 to SNF back on her Eliquis.  I saw her 04/04/20 She is accompanied by her husband. Cardiac-wise she reports feeling "fine", no cardiac awareness, no CP or palpitations No CP, no dizzy spells, near syncope or syncope. No SOB or overt DOE. She is frustrated by what she feels is a slow recover from her GIB, silll generally weak and without energy. Wants to be back to her baseline faster. She saw her PMD yesterday had labs, they have not yet called her with results She denies any ongoing bleedin or stool changes She noted today that she was taking the diltiazem BID when was Rx only daily. She has developed significant ankle/LE swelling and resumed her prior lasix dose (26m daily) yesterday and did not an up-tick in urine OP. We reviewed her meds from her last couple hospital stays Her prior diltiazem was written for 1237mBID and now after the last reduced to daily and she had not realized that Her furosemide was stopped at her August hospital discharge Final f/u BMET noted stable K+ and Creat.  TODAY She is doing much better. Finally feels more like herself, better energy and overall strength.   No CP, palpitations or cardiac awareness.  No SOB or  SOE. She had good response to the lasix and feels like her feet/legs are about back to normal.  The right seems always a bit puffier.  They both are better in the AM with a little swelling by end of day. No dizzy spells, near syncope or syncope. She will ave slight tinge of blood with straining/constipataion, no bleeding otherwise. She would like to reduce the lasix some, secondary to urinary urgency.  Device information MDT dual chamber PPM, implanted 2013 RV lead is a StJ  lead  Past Medical History:  Diagnosis Date   Anemia due to chronic blood loss 12/2019   rectal bleeding   Bilateral lower extremity edema    CAD (coronary artery disease) cardiologist-- dr klCaryl Comes a. LHC 10/2017 30% RCA otherwise OK.  (nonobstructive cad)   Chronic diastolic CHF ([place]estive heart [place]   followed by dr klCaryl Comes Chronic venous insufficiency    Hemorrhoids    History of adenomatous polyp of colon    History of hemorrhagic cerebrovascular accident (CVA) without residual deficits 03/25/2015   (previous seen by neuologist--- dr xuErlinda Hong ICH left posterior parietal in setting  HTN/ and pt taking coumadin;   per pt no residual   History of kidney stones    History of recurrent UTIs    HLD (hyperlipidemia)    HTN (hypertension)    followed by dr klCaryl Comesnd pcp   Hypothyroidism    followed by pcp   NASH (nonalcoholic steatohepatitis)    hx medication induced hepatitis (vytorin per pt)  OA (osteoarthritis)    Pacemaker-Medtronic 07/29/2011  implanted   followed by dr Caryl Comes   Permanent atrial fibrillation [facility])    cardiology--- dr Caryl Comes--- hx several DCCVs   PMR (polymyalgia rheumatica) ([place])    03-14-2020  per pt has not had any issues in several years   Rectal bleeding    sinus node dysfunction//post termination pauses    SSS (sick sinus syndrome) ([address])    s/p  PPM 07-29-2011   Thoracic aortic aneurysm (TAA) ([place])    a. followed by Dr. Servando Snare.    Past Surgical  History:  Procedure Laterality Date   BIOPSY  02/23/2020   Procedure: BIOPSY;  Surgeon: Clarene Essex, MD;  Location: [facility];  Service: Endoscopy;;   CARDIAC CATHETERIZATION  12-05-2006  dr cooper   nonobstructive cad   CARDIAC PACEMAKER PLACEMENT  07-29-2011   dr Caryl Comes   medtronic (dual)   CARDIOVERSION  03/02/2012   Procedure: CARDIOVERSION;  Surgeon: Lelon Perla, MD;  Location: [place];  Service: Cardiovascular;  Laterality: N/A;   CARDIOVERSION  03/13/2012   Procedure: CARDIOVERSION;  Surgeon: Josue Hector, MD;  Location: [place];  Service: Cardiovascular;  Laterality: N/A;   CARDIOVERSION  04/15/2012   Procedure: CARDIOVERSION;  Surgeon: [name], MD;  Location: [place];  Service: Cardiovascular;  Laterality: N/A;   CARDIOVERSION N/A 06/07/2014   Procedure: CARDIOVERSION;  Surgeon: [name], MD;  Location: [facility] ENDOSCOPY;  Service: Cardiovascular;  Laterality: N/A;   CARDIOVERSION N/A 08/18/2014   Procedure: CARDIOVERSION;  Surgeon: [name], MD;  Location: [place];  Service: Cardiovascular;  Laterality: N/A;   CARDIOVERSION N/A 09/06/2015   Procedure: CARDIOVERSION;  Surgeon: Jerline Pain, MD;  Location: [place];  Service: Cardiovascular;  Laterality: N/A;   COLONOSCOPY N/A 02/23/2020   Procedure: COLONOSCOPY possible flex sig only;  Surgeon: Clarene Essex, MD;  Location: [facility];  Service: Endoscopy;  Laterality: N/A;   CYSTOSCOPY WITH RETROGRADE PYELOGRAM, URETEROSCOPY AND STENT PLACEMENT Right 10/ 2017  @[facility]    CYSTOSCOPY WITH STENT PLACEMENT Right 04/22/2016   Procedure: CYSTOSCOPY WITH RIGHT URETERAL STENT INSERTION;  Surgeon: [name], MD;  Location: [place];  Service: Urology;  Laterality: Right;   CYSTOSCOPY WITH URETEROSCOPY, STONE BASKETRY AND STENT PLACEMENT Right 04/30/2016   Procedure: CYSTOSCOPY WITH URETEROSCOPY, STENT PLACEMENT REMOVAL;  Surgeon: [name], MD;  Location: [facility];  Service: Urology;   Laterality: Right;   HEMORRHOID SURGERY N/A 03/15/2020   Procedure: 3 COLUMN HEMORRHOIDECTOMY;  Surgeon: Leighton Ruff, MD;  Location: [place];  Service: General;  Laterality: N/A;   [place] CATH AND CORONARY ANGIOGRAPHY N/A 10/28/2017   Procedure: LEFT HEART CATH AND CORONARY ANGIOGRAPHY;  Surgeon: Lorretta Harp, MD;  Location: [place] CV LAB;  Service: Cardiovascular;  Laterality: N/A;   PERMANENT PACEMAKER INSERTION N/A 07/29/2011   Procedure: PERMANENT PACEMAKER INSERTION;  Surgeon: [name], MD;  Location: [facility] CATH LAB;  Service: Cardiovascular;  Laterality: N/A;   UMBILICAL HERNIA REPAIR  1950's    Current Outpatient Medications  Medication Sig Dispense Refill   acetaminophen (TYLENOL) 500 MG tablet Take 1,000 mg by mouth every 6 (six) hours as needed for mild pain.     apixaban (ELIQUIS) 5 MG TABS tablet Take 1 tablet (5 mg total) by mouth 2 (two) times daily. 180 tablet 1   diltiazem (CARDIZEM CD) 120 MG 24 hr capsule Take 1 capsule (120 mg total) by  mouth 2 (two) times daily. 180 capsule 1   feeding supplement, ENSURE ENLIVE, (ENSURE ENLIVE) LIQD Take 237 mLs by mouth 2 (two) times daily between meals. 237 mL 12   furosemide (LASIX) 40 MG tablet Take 1.5 tablets (60 mg total) by mouth daily. 135 tablet 1   hydrocortisone (ANUSOL-HC) 2.5 % rectal cream Place rectally 3 (three) times daily. 30 g 0   hydrOXYzine (ATARAX/VISTARIL) 10 MG tablet Take 1 tablet (10 mg total) by mouth 3 (three) times daily as needed for anxiety. 30 tablet 0   labetalol (NORMODYNE) 300 MG tablet TAKE 1 TABLET BY MOUTH IN THE MORNING AND 1 & 1/2 TABLET IN THE EVENING. Please make yearly appt with Dr. Caryl Comes for May before anymore refills. 1st attempt (Patient taking differently: Take by mouth See admin instructions. TAKE 1 TABLET BY MOUTH IN THE MORNING AND 1 & 1/2 TABLET IN THE EVENING. appt) 225 tablet 1   levothyroxine  (SYNTHROID, LEVOTHROID) 25 MCG tablet Take 25 mcg by mouth daily before breakfast.      lidocaine (LMX) 4 % cream Apply topically 2 (two) times daily as needed (rectal pain). 30 g 0   Nystatin (GERHARDT'S BUTT CREAM) CREA Apply 1 application topically 3 (three) times daily.     polyvinyl alcohol (LIQUIFILM TEARS) 1.4 % ophthalmic solution Place 1 drop into both eyes as needed for dry eyes. 15 mL 0   potassium chloride (KLOR-CON) 10 MEQ tablet Take 1 tablet (10 mEq total) by mouth daily. 90 tablet 1   psyllium (HYDROCIL/METAMUCIL) 95 % PACK Take 1 packet by mouth daily. 240 each    No current facility-administered medications for this visit.    Allergies:   Penicillins, Statins, Tylenol [acetaminophen], Adhesive [tape], Amiodarone, Quinolones, Tape, and Codeine   Social History:  The patient  reports that she quit smoking about 29 years ago. Her smoking use included cigarettes. She quit after 20.00 years of use. She has never used smokeless tobacco. She reports that she does not drink alcohol and does not use drugs.   Family History:  The patient's family history includes Alzheimer's disease in an other family member; Coronary artery disease in her brother, brother, mother, and another family member.  ROS:  Please see the history of present illness.    All other systems are reviewed and otherwise negative.   PHYSICAL EXAM:  VS:  There were no vitals taken for this visit. BMI: There is no height or weight on file to calculate BMI. Well nourished, well developed, in no acute distress HEENT: normocephalic, atraumatic Neck: no JVD, carotid bruits or masses Cardiac:  irreg-irreg; no significant murmurs, no rubs, or gallops Lungs:  CTA b/l, no wheezing, rhonchi or rales Abd: soft, nontender MS: no deformity, age appropriate atrophy Ext: trace edema Skin: warm and dry, no rash Neuro:  No gross deficits appreciated Psych: euthymic mood, full affect  PPM site is stable, no tethering or  discomfort   EKG:  Not done today  PPM interrogation done today and reviewed by myself: Battery and lead measurements are good HR generally 60-100, most 80's, some fater VP 22.7% No HVR   Echo 09/09/19: 1. Left ventricular ejection fraction, by estimation, is 55 to 60%. The  left ventricle has normal function. The left ventricle has no regional  wall motion abnormalities. Left ventricular diastolic function could not  be evaluated. Left ventricular  diastolic function could not be evaluated.  2. Right ventricular systolic function is normal. The right ventricular  size is  mildly enlarged. There is mildly elevated pulmonary artery  systolic pressure. The estimated right ventricular systolic pressure is  83.2 mmHg.  3. Left atrial size was severely dilated.  4. The mitral valve is grossly normal. Mild mitral valve regurgitation.  No evidence of mitral stenosis.  5. Tricuspid valve regurgitation is mild to moderate.  6. The aortic valve is tricuspid. Aortic valve regurgitation is not  visualized. Mild to moderate aortic valve sclerosis/calcification is  present, without any evidence of aortic stenosis.  7. Aneurysm of the ascending aorta, measuring 46 mm.  8. The inferior vena cava is normal in size with greater than 50%  respiratory variability, suggesting right atrial pressure of 3 mmHg.   Recent Labs: 02/21/2020: ALT 13 03/19/2020: Magnesium 2.2; TSH 1.587 03/24/2020: Hemoglobin 9.4; Platelets 297 04/19/2020: BUN 17; Creatinine, Ser 0.73; Potassium 3.7; Sodium 142  No results found for requested labs within last 8760 hours.   CrCl cannot be calculated (Unknown ideal weight.).   Wt Readings from Last 3 Encounters:  04/04/20 150 lb (68 kg)  03/24/20 153 lb 14.1 oz (69.8 kg)  03/15/20 147 lb 1.6 oz (66.7 kg)     Other studies reviewed: Additional studies/records reviewed today include: summarized above  ASSESSMENT AND PLAN:  1. PPM     Intact function, no  programming changes made  2. AFib (permanent)     CHA2DS2Vasc is 7, on eliquis appropriately dosed     Rates generally look OK  3. Chronic CHF (diastolic)     Compensated by exam, no symptoms of volume OL     Will reduce her lasix to 22m daily  4. HTN     No changes today      Disposition: We will see her back in [place] if needed.  Continue remotes as usual  Current medicines are reviewed at length with the patient today.  The patient did not have any concerns regarding medicines.  SiVenetia NightPA-C 05/03/2020 6:41 AM     [place] [place] [phone])  [phone])

## 2020-05-15 ENCOUNTER — Other Ambulatory Visit: Payer: Self-pay

## 2020-05-15 MED ORDER — LABETALOL HCL 300 MG PO TABS: ORAL_TABLET | ORAL | 3 refills | Status: AC

## 2020-05-26 DIAGNOSIS — M546 Pain in thoracic spine: Secondary | ICD-10-CM | POA: Diagnosis not present

## 2020-06-13 ENCOUNTER — Observation Stay (HOSPITAL_COMMUNITY)
Admission: EM | Admit: 2020-06-13 | Discharge: 2020-06-15 | Disposition: A | Discharge status: Home / Self Care | Payer: Medicare PPO | Attending: Internal Medicine | Admitting: Internal Medicine

## 2020-06-13 ENCOUNTER — Emergency Department (HOSPITAL_COMMUNITY): Payer: Medicare PPO

## 2020-06-13 ENCOUNTER — Other Ambulatory Visit: Payer: Self-pay

## 2020-06-13 DIAGNOSIS — Z7901 Long term (current) use of anticoagulants: Secondary | ICD-10-CM | POA: Insufficient documentation

## 2020-06-13 DIAGNOSIS — Z955 Presence of coronary angioplasty implant and graft: Secondary | ICD-10-CM | POA: Insufficient documentation

## 2020-06-13 DIAGNOSIS — I4581 Long QT syndrome: Secondary | ICD-10-CM | POA: Diagnosis not present

## 2020-06-13 DIAGNOSIS — Z950 Presence of cardiac pacemaker: Secondary | ICD-10-CM | POA: Insufficient documentation

## 2020-06-13 DIAGNOSIS — Z87891 Personal history of nicotine dependence: Secondary | ICD-10-CM | POA: Insufficient documentation

## 2020-06-13 DIAGNOSIS — E039 Hypothyroidism, unspecified: Secondary | ICD-10-CM | POA: Diagnosis not present

## 2020-06-13 DIAGNOSIS — I110 Hypertensive heart disease with heart failure: Secondary | ICD-10-CM | POA: Insufficient documentation

## 2020-06-13 DIAGNOSIS — I4891 Unspecified atrial fibrillation: Secondary | ICD-10-CM | POA: Diagnosis present

## 2020-06-13 DIAGNOSIS — R531 Weakness: Secondary | ICD-10-CM | POA: Diagnosis not present

## 2020-06-13 DIAGNOSIS — M6281 Muscle weakness (generalized): Secondary | ICD-10-CM | POA: Insufficient documentation

## 2020-06-13 DIAGNOSIS — U071 COVID-19: Secondary | ICD-10-CM | POA: Diagnosis not present

## 2020-06-13 DIAGNOSIS — I2510 Atherosclerotic heart disease of native coronary artery without angina pectoris: Secondary | ICD-10-CM | POA: Insufficient documentation

## 2020-06-13 DIAGNOSIS — R0902 Hypoxemia: Secondary | ICD-10-CM | POA: Diagnosis not present

## 2020-06-13 DIAGNOSIS — E876 Hypokalemia: Secondary | ICD-10-CM

## 2020-06-13 DIAGNOSIS — I5032 Chronic diastolic (congestive) heart failure: Secondary | ICD-10-CM | POA: Diagnosis not present

## 2020-06-13 DIAGNOSIS — I10 Essential (primary) hypertension: Secondary | ICD-10-CM | POA: Diagnosis present

## 2020-06-13 DIAGNOSIS — Z79899 Other long term (current) drug therapy: Secondary | ICD-10-CM | POA: Insufficient documentation

## 2020-06-13 DIAGNOSIS — R9431 Abnormal electrocardiogram [ECG] [EKG]: Secondary | ICD-10-CM

## 2020-06-13 DIAGNOSIS — Z23 Encounter for immunization: Principal | ICD-10-CM | POA: Insufficient documentation

## 2020-06-13 DIAGNOSIS — R0602 Shortness of breath: Secondary | ICD-10-CM | POA: Diagnosis not present

## 2020-06-13 DIAGNOSIS — J90 Pleural effusion, not elsewhere classified: Secondary | ICD-10-CM | POA: Diagnosis not present

## 2020-06-13 DIAGNOSIS — R000 Tachycardia, unspecified: Secondary | ICD-10-CM | POA: Diagnosis not present

## 2020-06-13 HISTORY — DX: COVID-19: U07.1

## 2020-06-13 LAB — CBC WITH DIFFERENTIAL/PLATELET
Abs Immature Granulocytes: 0.01 10*3/uL (ref 0.00–0.07)
Basophils Absolute: 0 10*3/uL (ref 0.0–0.1)
Basophils Relative: 0 %
Eosinophils Absolute: 0 10*3/uL (ref 0.0–0.5)
Eosinophils Relative: 1 %
HCT: 33.2 % — ABNORMAL LOW (ref 36.0–46.0)
Hemoglobin: 11.1 g/dL — ABNORMAL LOW (ref 12.0–15.0)
Immature Granulocytes: 0 %
Lymphocytes Relative: 18 %
Lymphs Abs: 0.7 10*3/uL (ref 0.7–4.0)
MCH: 31.7 pg (ref 26.0–34.0)
MCHC: 33.4 g/dL (ref 30.0–36.0)
MCV: 94.9 fL (ref 80.0–100.0)
Monocytes Absolute: 0.5 10*3/uL (ref 0.1–1.0)
Monocytes Relative: 11 %
Neutro Abs: 2.8 10*3/uL (ref 1.7–7.7)
Neutrophils Relative %: 70 %
Platelets: 180 10*3/uL (ref 150–400)
RBC: 3.5 MIL/uL — ABNORMAL LOW (ref 3.87–5.11)
RDW: 14.8 % (ref 11.5–15.5)
WBC: 4.1 10*3/uL (ref 4.0–10.5)
nRBC: 0 % (ref 0.0–0.2)

## 2020-06-13 LAB — COMPREHENSIVE METABOLIC PANEL
ALT: 15 U/L (ref 0–44)
AST: 25 U/L (ref 15–41)
Albumin: 2.4 g/dL — ABNORMAL LOW (ref 3.5–5.0)
Alkaline Phosphatase: 58 U/L (ref 38–126)
Anion gap: 13 (ref 5–15)
BUN: 8 mg/dL (ref 8–23)
CO2: 24 mmol/L (ref 22–32)
Calcium: 8.6 mg/dL — ABNORMAL LOW (ref 8.9–10.3)
Chloride: 99 mmol/L (ref 98–111)
Creatinine, Ser: 0.87 mg/dL (ref 0.44–1.00)
GFR, Estimated: >60 mL/min (ref >60)
Glucose, Bld: 140 mg/dL — ABNORMAL HIGH (ref 70–99)
Potassium: 2.2 mmol/L — CL (ref 3.5–5.1)
Sodium: 136 mmol/L (ref 135–145)
Total Bilirubin: 1 mg/dL (ref 0.3–1.2)
Total Protein: 4.7 g/dL — ABNORMAL LOW (ref 6.5–8.1)

## 2020-06-13 LAB — POC SARS CORONAVIRUS 2 AG -  ED: SARS Coronavirus 2 Ag: POSITIVE — AB

## 2020-06-13 LAB — BRAIN NATRIURETIC PEPTIDE: B Natriuretic Peptide: 349.8 pg/mL — ABNORMAL HIGH (ref 0.0–100.0)

## 2020-06-13 LAB — MAGNESIUM: Magnesium: 1.4 mg/dL — ABNORMAL LOW (ref 1.7–2.4)

## 2020-06-13 LAB — PROCALCITONIN: Procalcitonin: <0.1 ng/mL

## 2020-06-13 LAB — LACTIC ACID, PLASMA: Lactic Acid, Venous: 1.5 mmol/L (ref 0.5–1.9)

## 2020-06-13 LAB — TRIGLYCERIDES: Triglycerides: 73 mg/dL (ref <150)

## 2020-06-13 LAB — FERRITIN: Ferritin: 145 ng/mL (ref 11–307)

## 2020-06-13 LAB — FIBRINOGEN: Fibrinogen: 532 mg/dL — ABNORMAL HIGH (ref 210–475)

## 2020-06-13 LAB — LACTATE DEHYDROGENASE: LDH: 166 U/L (ref 98–192)

## 2020-06-13 LAB — C-REACTIVE PROTEIN: CRP: 12.4 mg/dL — ABNORMAL HIGH (ref <1.0)

## 2020-06-13 LAB — D-DIMER, QUANTITATIVE: D-Dimer, Quant: 0.8 ug/mL-FEU — ABNORMAL HIGH (ref 0.00–0.50)

## 2020-06-13 MED ORDER — DEXAMETHASONE SODIUM PHOSPHATE 4 MG/ML IJ SOLN
4.0000 mg | Freq: Once | INTRAMUSCULAR | Status: AC
  Administered 2020-06-13: 4 mg via INTRAVENOUS
  Filled 2020-06-13: qty 1

## 2020-06-13 MED ORDER — POTASSIUM CHLORIDE 10 MEQ/100ML IV SOLN
10.0000 meq | INTRAVENOUS | Status: AC
  Administered 2020-06-13 – 2020-06-14 (×3): 10 meq via INTRAVENOUS
  Filled 2020-06-13 (×3): qty 100

## 2020-06-13 MED ORDER — ALBUTEROL SULFATE HFA 108 (90 BASE) MCG/ACT IN AERS
2.0000 | INHALATION_SPRAY | Freq: Once | RESPIRATORY_TRACT | Status: AC
  Administered 2020-06-13: 2 via RESPIRATORY_TRACT
  Filled 2020-06-13: qty 6.7

## 2020-06-13 MED ORDER — POTASSIUM CHLORIDE CRYS ER 20 MEQ PO TBCR
40.0000 meq | EXTENDED_RELEASE_TABLET | Freq: Once | ORAL | Status: AC
  Administered 2020-06-13: 40 meq via ORAL
  Filled 2020-06-13: qty 2

## 2020-06-13 NOTE — ED Provider Notes (Signed)
[facility] EMERGENCY DEPARTMENT Provider Note   CSN: [id number] Arrival date & time: 06/13/20  2044     History Chief Complaint  Patient presents with  . Shortness of Breath    [name] [name] is a 86 y.o. female hx of CAD, Afib on eliquis, CHF, here presenting with shortness of breath and positive Covid.  Patient states that she got together with her family.  She states that her daughter tested positive for Covid 2 days ago.  She started having shortness of breath yesterday.  She the antigen test and tested positive for Covid today.  She states that she has some chills as well.  She received vaginal vaccine back in December.  She did not get the booster shot.  She was given Tylenol prior to arrival.  The history is provided by the patient.       Past Medical History:  Diagnosis Date  . Anemia due to chronic blood loss 12/2019   rectal bleeding  . Bilateral lower extremity edema   . CAD (coronary artery disease) cardiologist-- dr Caryl Comes   a. LHC 10/2017 30% RCA otherwise OK.  (nonobstructive cad)  . Chronic diastolic CHF (congestive heart failure) ([place])    followed by dr Caryl Comes  . Chronic venous insufficiency   . Hemorrhoids   . History of adenomatous polyp of colon   . History of hemorrhagic cerebrovascular accident (CVA) without residual deficits 03/25/2015   (previous seen by neuologist--- dr Erlinda Hong   ICH left posterior parietal in setting  HTN/ and pt taking coumadin;   per pt no residual  . History of kidney stones   . History of recurrent UTIs   . HLD (hyperlipidemia)   . HTN (hypertension)    followed by dr Caryl Comes and pcp  . Hypothyroidism    followed by pcp  . NASH (nonalcoholic steatohepatitis)    hx medication induced hepatitis (vytorin per pt)  . OA (osteoarthritis)   . Pacemaker-Medtronic 07/29/2011  implanted   followed by dr Caryl Comes  . Permanent atrial fibrillation [facility])    cardiology--- dr Caryl Comes--- hx several DCCVs  . PMR (polymyalgia  rheumatica) ([place])    03-14-2020  per pt has not had any issues in several years  . Rectal bleeding   . sinus node dysfunction//post termination pauses   . SSS (sick sinus syndrome) ([place])    s/p  PPM 07-29-2011  . Thoracic aortic aneurysm (TAA) ([place])    a. followed by Dr. Servando Snare.    Patient Active Problem List   Diagnosis Date Noted  . Hemorrhoids 03/24/2020  . AKI (acute kidney injury) ([place]) 03/16/2020  . Hypokalemia 03/16/2020  . Lower GI bleed   . Acute blood loss anemia   . Acute lower GI bleeding 02/21/2020  . Symptomatic anemia 02/21/2020  . Migraine equivalent 01/06/2017  . Calculus of kidney   . Chronic diastolic CHF (congestive heart failure) ([place])   . AAA (abdominal aortic aneurysm) without rupture ([place])   . PMR (polymyalgia rheumatica) ([facility])   . Hypothyroidism   . Calculus of ureter   . CAD in native artery   . Other specified hypothyroidism   . Elevated LFTs 04/22/2016  . Hydronephrosis 04/22/2016  . Renal lithiasis 04/22/2016  . UTI (urinary tract infection) 04/22/2016  . Acute kidney injury ([place]) 04/22/2016  . Atrial flutter ([place])   . Nontraumatic cortical hemorrhage of left cerebral hemisphere ([place]) 05/03/2015  . Chronic anticoagulation 05/03/2015  . Cardiac pacemaker in situ 05/03/2015  . Encounter  for therapeutic drug monitoring 10/04/2013  . Dyspnea on exertion 07/26/2013  . Chest pain 02/04/2013  . Orthostatic lightheadedness 06/25/2012  . Pacemaker-Medtronic 11/12/2011  . Ascending aorta enlargement ([place]) 08/12/2011  . Chronic diastolic heart failure ([place]) 12/27/2010  . Long term (current) use of anticoagulants 10/15/2010  . PREMATURE VENTRICULAR CONTRACTIONS 10/17/2009  . post termination pauses/.sinus bradycardia 11/17/2008  . Essential hypertension 04/28/2008  . Atrial fibrillation ([place]) 04/28/2008    Past Surgical History:  Procedure Laterality Date  . BIOPSY  02/23/2020   Procedure: BIOPSY;  Surgeon: Clarene Essex, MD;  Location: [facility]  ENDOSCOPY;  Service: Endoscopy;;  . CARDIAC CATHETERIZATION  12-05-2006  dr cooper   nonobstructive cad  . CARDIAC PACEMAKER PLACEMENT  07-29-2011   dr Valaria Good (dual)  . CARDIOVERSION  03/02/2012   Procedure: CARDIOVERSION;  Surgeon: Lelon Perla, MD;  Location: [facility] ENDOSCOPY;  Service: Cardiovascular;  Laterality: N/A;  . CARDIOVERSION  03/13/2012   Procedure: CARDIOVERSION;  Surgeon: Josue Hector, MD;  Location: [place];  Service: Cardiovascular;  Laterality: N/A;  . CARDIOVERSION  04/15/2012   Procedure: CARDIOVERSION;  Surgeon: [name], MD;  Location: [place];  Service: Cardiovascular;  Laterality: N/A;  . CARDIOVERSION N/A 06/07/2014   Procedure: CARDIOVERSION;  Surgeon: [name], MD;  Location: [facility] ENDOSCOPY;  Service: Cardiovascular;  Laterality: N/A;  . CARDIOVERSION N/A 08/18/2014   Procedure: CARDIOVERSION;  Surgeon: [name], MD;  Location: [place];  Service: Cardiovascular;  Laterality: N/A;  . CARDIOVERSION N/A 09/06/2015   Procedure: CARDIOVERSION;  Surgeon: Jerline Pain, MD;  Location: [place];  Service: Cardiovascular;  Laterality: N/A;  . COLONOSCOPY N/A 02/23/2020   Procedure: COLONOSCOPY possible flex sig only;  Surgeon: Clarene Essex, MD;  Location: [facility] ENDOSCOPY;  Service: Endoscopy;  Laterality: N/A;  . CYSTOSCOPY WITH RETROGRADE PYELOGRAM, URETEROSCOPY AND STENT PLACEMENT Right 10/ 2017  @[facility]   . CYSTOSCOPY WITH STENT PLACEMENT Right 04/22/2016   Procedure: CYSTOSCOPY WITH RIGHT URETERAL STENT INSERTION;  Surgeon: [name], MD;  Location: [place];  Service: Urology;  Laterality: Right;  . CYSTOSCOPY WITH URETEROSCOPY, STONE BASKETRY AND STENT PLACEMENT Right 04/30/2016   Procedure: CYSTOSCOPY WITH URETEROSCOPY, STENT PLACEMENT REMOVAL;  Surgeon: [name], MD;  Location: [facility] ORS;  Service: Urology;  Laterality: Right;  . HEMORRHOID SURGERY N/A 03/15/2020   Procedure: 3 COLUMN HEMORRHOIDECTOMY;  Surgeon: Leighton Ruff, MD;   Location[facility];  Service: General;  Laterality: N/A;  . [place]  . LEFT HEART CATH AND CORONARY ANGIOGRAPHY N/A 10/28/2017   Procedure: LEFT HEART CATH AND CORONARY ANGIOGRAPHY;  Surgeon: Lorretta Harp, MD;  Location: [place] CV LAB;  Service: Cardiovascular;  Laterality: N/A;  . PERMANENT PACEMAKER INSERTION N/A 07/29/2011   Procedure: PERMANENT PACEMAKER INSERTION;  Surgeon: [name], MD;  Location: [facility] CATH LAB;  Service: Cardiovascular;  Laterality: N/A;  . UMBILICAL HERNIA REPAIR  1950's     OB History   No obstetric history on file.     Family History  Problem Relation Age of Onset  . Coronary artery disease Mother   . Coronary artery disease Other   . Alzheimer's disease Other   . Coronary artery disease Brother   . Coronary artery disease Brother     Social History   Tobacco Use  . Smoking status: Former Smoker    Years: 20.00    Types: Cigarettes    Quit date: 07/16/1990    Years  since quitting: 29.9  . Smokeless tobacco: Never Used  Vaping Use  . Vaping Use: Never used  Substance Use Topics  . Alcohol use: No  . Drug use: No    Home Medications Prior to Admission medications   Medication Sig Start Date End Date Taking? Authorizing Provider  acetaminophen (TYLENOL) 500 MG tablet Take 1,000 mg by mouth every 6 (six) hours as needed for mild pain.    [provider]  apixaban (ELIQUIS) 5 MG TABS tablet Take 1 tablet (5 mg total) by mouth 2 (two) times daily. 02/27/20   Nita Sells, MD  diltiazem (CARDIZEM CD) 120 MG 24 hr capsule Take 1 capsule (120 mg total) by mouth 2 (two) times daily. 04/04/20   Baldwin Jamaica, PA-C  escitalopram (LEXAPRO) 10 MG tablet Take 10 mg by mouth daily.    [provider]  feeding supplement, ENSURE ENLIVE, (ENSURE ENLIVE) LIQD Take 237 mLs by mouth 2 (two) times daily between meals. 03/24/20   Guilford Shi, MD  furosemide (LASIX) 40 MG  tablet Take 1 tablet (40 mg total) by mouth daily. 05/03/20 08/01/20  Baldwin Jamaica, PA-C  hydrOXYzine (ATARAX/VISTARIL) 10 MG tablet Take 1 tablet (10 mg total) by mouth 3 (three) times daily as needed for anxiety. 03/24/20   Guilford Shi, MD  labetalol (NORMODYNE) 300 MG tablet TAKE 1 TABLET BY MOUTH IN THE MORNING AND 1 & 1/2 TABLET IN THE EVENING. 05/15/20   [name], MD  levothyroxine (SYNTHROID, LEVOTHROID) 25 MCG tablet Take 25 mcg by mouth daily before breakfast.     [provider]  polyvinyl alcohol (LIQUIFILM TEARS) 1.4 % ophthalmic solution Place 1 drop into both eyes as needed for dry eyes. 03/24/20   Guilford Shi, MD  potassium chloride (KLOR-CON) 10 MEQ tablet Take 1 tablet (10 mEq total) by mouth daily. 04/06/20 07/05/20  Baldwin Jamaica, PA-C  psyllium (HYDROCIL/METAMUCIL) 95 % PACK Take 1 packet by mouth daily. 03/25/20   Guilford Shi, MD  traMADol (ULTRAM) 50 MG tablet Take 50 mg by mouth as needed. 04/25/20   [provider]    Allergies    Penicillins, Statins, Tylenol [acetaminophen], Adhesive [tape], Amiodarone, Quinolones, Tape, and Codeine  Review of Systems   Review of Systems  Respiratory: Positive for shortness of breath.   All other systems reviewed and are negative.   Physical Exam Updated Vital Signs BP (!) 169/99   Pulse (!) 122   Temp 99.8 F (37.7 C) (Oral)   Resp (!) 35   Ht 5' 5"  (1.651 m)   Wt 65.8 kg   SpO2 94%   BMI 24.14 kg/m   Physical Exam Vitals and nursing note reviewed.  Constitutional:      Comments: Tachypneic, slightly uncomfortable  HENT:     Head: Normocephalic.     Mouth/Throat:     Mouth: Mucous membranes are moist.  Eyes:     Extraocular Movements: Extraocular movements intact.     Pupils: Pupils are equal, round, and reactive to light.  Cardiovascular:     Rate and Rhythm: Tachycardia present. Rhythm irregular.  Pulmonary:     Comments: Diminished breath sounds bilaterally with  some dry crackles bilaterally Abdominal:     General: Bowel sounds are normal.     Palpations: Abdomen is soft.  Musculoskeletal:        General: Normal range of motion.  Skin:    General: Skin is warm.     Capillary Refill: Capillary refill takes  less than 2 seconds.  Neurological:     General: No focal deficit present.     Mental Status: She is oriented to person, place, and time.  Psychiatric:        Mood and Affect: Mood normal.        Behavior: Behavior normal.     ED Results / Procedures / Treatments   Labs (all labs ordered are listed, but only abnormal results are displayed) Labs Reviewed  CULTURE, BLOOD (ROUTINE X 2)  CULTURE, BLOOD (ROUTINE X 2)  LACTIC ACID, PLASMA  LACTIC ACID, PLASMA  CBC WITH DIFFERENTIAL/PLATELET  COMPREHENSIVE METABOLIC PANEL  D-DIMER, QUANTITATIVE (NOT AT [facility])  PROCALCITONIN  LACTATE DEHYDROGENASE  FERRITIN  TRIGLYCERIDES  FIBRINOGEN  C-REACTIVE PROTEIN  POC SARS CORONAVIRUS 2 AG -  ED    EKG None  Radiology No results found.  Procedures Procedures (including critical care time)  Medications Ordered in ED Medications  dexamethasone (DECADRON) injection 4 mg (has no administration in time range)    ED Course  I have reviewed the triage vital signs and the nursing notes.  Pertinent labs & imaging results that were available during my care of the patient were reviewed by me and considered in my medical decision making (see chart for details).    MDM Rules/Calculators/A&P                         [name] [name] is a 86 y.o. female here with SOB. Patient tested positive for COVID at home. She is fully vaccinated. Appears to be in rapid afib in the ED. Patient's O2 is borderline around low 90s.  Plan to formally test for Covid and get Covid preadmission labs and chest x-ray  11:07 PM WBC is 4. COVID positive. K is 2.2. She has prolonged QTc of 520 that is new from previous. Given multiple rounds of IV potassium. Also O2 is  borderline around 91-92% and put on 2 L [place]. Hospitalist to admit for COVID, hypokalemia and prolonged QTc   Final Clinical Impression(s) / ED Diagnoses Final diagnoses:  None    Rx / DC Orders ED Discharge Orders    None       Drenda Freeze, MD 06/13/20 2308

## 2020-06-13 NOTE — ED Triage Notes (Signed)
Pt to ED today from home. Family called 911 r/t SOB and fever.  Pt's daughter tested positive for COVID Sunday and pt took home test today that was positive.  PT febrile for EMS 1082m tylenol administered.  Pt alert and oriented x4 on room air.

## 2020-06-13 NOTE — ED Notes (Signed)
CRITICAL VALUE ALERT  Critical Value:  Potassium 2.2  Date & Time Notied:  06/13/20 2243  Provider Notified: Dr. Darl Householder

## 2020-06-14 ENCOUNTER — Encounter (HOSPITAL_COMMUNITY): Payer: Self-pay | Admitting: Internal Medicine

## 2020-06-14 DIAGNOSIS — I5032 Chronic diastolic (congestive) heart failure: Secondary | ICD-10-CM

## 2020-06-14 DIAGNOSIS — E876 Hypokalemia: Secondary | ICD-10-CM | POA: Diagnosis not present

## 2020-06-14 DIAGNOSIS — Z23 Encounter for immunization: Secondary | ICD-10-CM | POA: Diagnosis not present

## 2020-06-14 DIAGNOSIS — U071 COVID-19: Secondary | ICD-10-CM | POA: Diagnosis not present

## 2020-06-14 DIAGNOSIS — I4891 Unspecified atrial fibrillation: Secondary | ICD-10-CM | POA: Diagnosis not present

## 2020-06-14 DIAGNOSIS — R0602 Shortness of breath: Secondary | ICD-10-CM | POA: Diagnosis present

## 2020-06-14 DIAGNOSIS — I10 Essential (primary) hypertension: Secondary | ICD-10-CM

## 2020-06-14 LAB — CBC WITH DIFFERENTIAL/PLATELET
Abs Immature Granulocytes: 0.01 10*3/uL (ref 0.00–0.07)
Basophils Absolute: 0 10*3/uL (ref 0.0–0.1)
Basophils Relative: 0 %
Eosinophils Absolute: 0 10*3/uL (ref 0.0–0.5)
Eosinophils Relative: 0 %
HCT: 37.3 % (ref 36.0–46.0)
Hemoglobin: 12 g/dL (ref 12.0–15.0)
Immature Granulocytes: 0 %
Lymphocytes Relative: 22 %
Lymphs Abs: 0.5 10*3/uL — ABNORMAL LOW (ref 0.7–4.0)
MCH: 31.4 pg (ref 26.0–34.0)
MCHC: 32.2 g/dL (ref 30.0–36.0)
MCV: 97.6 fL (ref 80.0–100.0)
Monocytes Absolute: 0.2 10*3/uL (ref 0.1–1.0)
Monocytes Relative: 7 %
Neutro Abs: 1.8 10*3/uL (ref 1.7–7.7)
Neutrophils Relative %: 71 %
Platelets: 188 10*3/uL (ref 150–400)
RBC: 3.82 MIL/uL — ABNORMAL LOW (ref 3.87–5.11)
RDW: 14.8 % (ref 11.5–15.5)
WBC: 2.5 10*3/uL — ABNORMAL LOW (ref 4.0–10.5)
nRBC: 0 % (ref 0.0–0.2)

## 2020-06-14 LAB — COMPREHENSIVE METABOLIC PANEL
ALT: 14 U/L (ref 0–44)
AST: 28 U/L (ref 15–41)
Albumin: 2.4 g/dL — ABNORMAL LOW (ref 3.5–5.0)
Alkaline Phosphatase: 55 U/L (ref 38–126)
Anion gap: 13 (ref 5–15)
BUN: 9 mg/dL (ref 8–23)
CO2: 20 mmol/L — ABNORMAL LOW (ref 22–32)
Calcium: 8.4 mg/dL — ABNORMAL LOW (ref 8.9–10.3)
Chloride: 102 mmol/L (ref 98–111)
Creatinine, Ser: 0.75 mg/dL (ref 0.44–1.00)
GFR, Estimated: >60 mL/min (ref >60)
Glucose, Bld: 159 mg/dL — ABNORMAL HIGH (ref 70–99)
Potassium: 5 mmol/L (ref 3.5–5.1)
Sodium: 135 mmol/L (ref 135–145)
Total Bilirubin: 0.9 mg/dL (ref 0.3–1.2)
Total Protein: 4.9 g/dL — ABNORMAL LOW (ref 6.5–8.1)

## 2020-06-14 LAB — LACTIC ACID, PLASMA: Lactic Acid, Venous: 1.4 mmol/L (ref 0.5–1.9)

## 2020-06-14 LAB — MAGNESIUM
Magnesium: 1.6 mg/dL — ABNORMAL LOW (ref 1.7–2.4)
Magnesium: 2.2 mg/dL (ref 1.7–2.4)

## 2020-06-14 LAB — C-REACTIVE PROTEIN: CRP: 13.5 mg/dL — ABNORMAL HIGH (ref <1.0)

## 2020-06-14 LAB — TSH: TSH: 1.219 u[IU]/mL (ref 0.350–4.500)

## 2020-06-14 LAB — TROPONIN I (HIGH SENSITIVITY)
Troponin I (High Sensitivity): 52 ng/L — ABNORMAL HIGH (ref <18)
Troponin I (High Sensitivity): 36 ng/L — ABNORMAL HIGH (ref <18)

## 2020-06-14 LAB — D-DIMER, QUANTITATIVE: D-Dimer, Quant: 0.62 ug/mL-FEU — ABNORMAL HIGH (ref 0.00–0.50)

## 2020-06-14 LAB — PHOSPHORUS: Phosphorus: 2.8 mg/dL (ref 2.5–4.6)

## 2020-06-14 MED ORDER — METHYLPREDNISOLONE SODIUM SUCC 125 MG IJ SOLR: 125.0000 mg | Freq: Once | INTRAMUSCULAR | Status: DC | PRN

## 2020-06-14 MED ORDER — TRAMADOL HCL 50 MG PO TABS
50.0000 mg | ORAL_TABLET | Freq: Two times a day (BID) | ORAL | Status: DC | PRN
  Administered 2020-06-14: 50 mg via ORAL
  Filled 2020-06-14: qty 1

## 2020-06-14 MED ORDER — LABETALOL HCL 300 MG PO TABS
300.0000 mg | ORAL_TABLET | Freq: Two times a day (BID) | ORAL | Status: DC
  Administered 2020-06-14 – 2020-06-15 (×4): 300 mg via ORAL
  Filled 2020-06-14 (×2): qty 2
  Filled 2020-06-14: qty 1
  Filled 2020-06-14: qty 2

## 2020-06-14 MED ORDER — SODIUM CHLORIDE 0.9 % IV SOLN
Freq: Once | INTRAVENOUS | Status: DC
  Filled 2020-06-14: qty 5

## 2020-06-14 MED ORDER — APIXABAN 5 MG PO TABS
5.0000 mg | ORAL_TABLET | Freq: Two times a day (BID) | ORAL | Status: DC
  Administered 2020-06-14 – 2020-06-15 (×3): 5 mg via ORAL
  Filled 2020-06-14 (×3): qty 1

## 2020-06-14 MED ORDER — ESCITALOPRAM OXALATE 10 MG PO TABS: 10.0000 mg | ORAL_TABLET | Freq: Every day | ORAL | Status: DC

## 2020-06-14 MED ORDER — ZINC SULFATE 220 (50 ZN) MG PO CAPS
220.0000 mg | ORAL_CAPSULE | Freq: Every day | ORAL | Status: DC
  Administered 2020-06-14 – 2020-06-15 (×2): 220 mg via ORAL
  Filled 2020-06-14 (×2): qty 1

## 2020-06-14 MED ORDER — SODIUM CHLORIDE 0.9 % IV SOLN
1200.0000 mg | Freq: Once | INTRAVENOUS | Status: AC
  Administered 2020-06-14: 1200 mg via INTRAVENOUS
  Filled 2020-06-14: qty 10

## 2020-06-14 MED ORDER — HYDROXYZINE HCL 10 MG PO TABS: 10.0000 mg | ORAL_TABLET | Freq: Three times a day (TID) | ORAL | Status: DC | PRN

## 2020-06-14 MED ORDER — MAGNESIUM SULFATE 2 GM/50ML IV SOLN
2.0000 g | Freq: Once | INTRAVENOUS | Status: AC
  Administered 2020-06-14: 2 g via INTRAVENOUS
  Filled 2020-06-14: qty 50

## 2020-06-14 MED ORDER — FUROSEMIDE 40 MG PO TABS
40.0000 mg | ORAL_TABLET | Freq: Every day | ORAL | Status: DC
  Administered 2020-06-15: 40 mg via ORAL
  Filled 2020-06-14: qty 1

## 2020-06-14 MED ORDER — LEVOTHYROXINE SODIUM 25 MCG PO TABS
25.0000 ug | ORAL_TABLET | Freq: Every day | ORAL | Status: DC
  Administered 2020-06-14 – 2020-06-15 (×2): 25 ug via ORAL
  Filled 2020-06-14 (×2): qty 1

## 2020-06-14 MED ORDER — ACETAMINOPHEN 325 MG PO TABS
650.0000 mg | ORAL_TABLET | Freq: Four times a day (QID) | ORAL | Status: DC | PRN
  Administered 2020-06-14: 650 mg via ORAL
  Filled 2020-06-14: qty 2

## 2020-06-14 MED ORDER — SODIUM CHLORIDE 0.9 % IV SOLN: INTRAVENOUS | Status: DC | PRN

## 2020-06-14 MED ORDER — DILTIAZEM HCL ER COATED BEADS 120 MG PO CP24
120.0000 mg | ORAL_CAPSULE | Freq: Every day | ORAL | Status: DC
  Administered 2020-06-14 – 2020-06-15 (×2): 120 mg via ORAL
  Filled 2020-06-14 (×3): qty 1

## 2020-06-14 MED ORDER — POTASSIUM CHLORIDE 10 MEQ/100ML IV SOLN
10.0000 meq | INTRAVENOUS | Status: AC
  Administered 2020-06-14 (×2): 10 meq via INTRAVENOUS
  Filled 2020-06-14 (×2): qty 100

## 2020-06-14 MED ORDER — ASCORBIC ACID 500 MG PO TABS
500.0000 mg | ORAL_TABLET | Freq: Every day | ORAL | Status: DC
  Administered 2020-06-14 – 2020-06-15 (×2): 500 mg via ORAL
  Filled 2020-06-14 (×2): qty 1

## 2020-06-14 NOTE — Evaluation (Signed)
Occupational Therapy Evaluation Patient Details Name: [name] [name] MRN: [id number] DOB: 1931/03/23 Today's Date: 06/14/2020    History of Present Illness Pt is an 86 y/o female admitted secondary to SOB and weakness. Found to have a fib with RVR and is covid +. PMH includes HTN, dCHF, CAD.    Clinical Impression   Pt PTA: Pt living with spouse at home and reports independence with ADL and mobility. Pt currently limited by decreased strength, decreased ability to care for self and decreased mobility. Pt with bowel incontinence today. Pt minguardA +2 for stability with OOB sit to stands. Pt very anxious and requires encouragement. Pt minA overall for ADL and minguardA for mobility.  Pt would benefit from continued OT skilled services. OT following acutely.    Follow Up Recommendations  Home health OT;Supervision/Assistance - 24 hour    Equipment Recommendations  3 in 1 bedside commode    Recommendations for Other Services       Precautions / Restrictions Precautions Precautions: Fall Precaution Comments: Pt very fearful of falling. Has increased anxiety over fear of falling.  Restrictions Weight Bearing Restrictions: No      Mobility Bed Mobility Overal bed mobility: Needs Assistance Bed Mobility: Supine to Sit;Sit to Supine;Rolling Rolling: Supervision;+2 for safety/equipment   Supine to sit: Min assist;+2 for safety/equipment Sit to supine: Supervision   General bed mobility comments: use of rail; minA for trunk elevation. Incontinence episode and rolling side to side with assist to reach rails.    Transfers Overall transfer level: Needs assistance Equipment used: 1 person hand held assist Transfers: Sit to/from Stand Sit to Stand: Min guard;+2 safety/equipment         General transfer comment: Min guard for safety to stand from stretcher. Pt stood X2. Pt incontinent of bowels. Pt very anxious upon standing.     Balance Overall balance assessment: Needs  assistance Sitting-balance support: No upper extremity supported;Feet supported Sitting balance-Leahy Scale: Fair     Standing balance support: No upper extremity supported;Single extremity supported Standing balance-Leahy Scale: Fair Standing balance comment: Able to maintain static standing without UE support                            ADL either performed or assessed with clinical judgement   ADL Overall ADL's : Needs assistance/impaired Eating/Feeding: Set up;Sitting   Grooming: Set up;Sitting   Upper Body Bathing: Set up;Sitting   Lower Body Bathing: Moderate assistance;Sitting/lateral leans;Sit to/from stand   Upper Body Dressing : Set up;Sitting   Lower Body Dressing: Moderate assistance;Sitting/lateral leans;Sit to/from stand   Toilet Transfer: Min guard;+2 for physical assistance;Cueing for safety;Ambulation;Regular Toilet;Grab bars   Toileting- Clothing Manipulation and Hygiene: Min guard;+2 for safety/equipment;Sitting/lateral lean;Sit to/from stand       Functional mobility during ADLs: Min guard;+2 for physical assistance;Rolling walker;Cueing for safety General ADL Comments: Pt limited by decreased strength, decreased ability to care for self and decreased mobility. Pt with bowel incontinence today.     Vision Baseline Vision/History: No visual deficits Vision Assessment?: No apparent visual deficits     Perception     Praxis      Pertinent Vitals/Pain Pain Assessment: No/denies pain     Hand Dominance Right   Extremity/Trunk Assessment Upper Extremity Assessment Upper Extremity Assessment: Generalized weakness   Lower Extremity Assessment Lower Extremity Assessment: Generalized weakness   Cervical / Trunk Assessment Cervical / Trunk Assessment: Kyphotic   Communication Communication Communication: No difficulties  Cognition Arousal/Alertness: Awake/alert Behavior During Therapy: Anxious Overall Cognitive Status: Within  Functional Limits for tasks assessed                                 General Comments: Pt very anxious about performing mobility tasks. Also reports she is very fearful of falling.    General Comments  VSS on RA    Exercises     Shoulder Instructions      Home Living Family/patient expects to be discharged to:: Private residence Living Arrangements: Spouse/significant other Available Help at Discharge: Family;Available 24 hours/day Type of Home: House Home Access: Stairs to enter [employer] of Steps: 2 Entrance Stairs-Rails: None Home Layout: One level     Bathroom Shower/Tub: Tub/shower unit;Walk-in shower   Bathroom Toilet: Handicapped height     Home Equipment: Environmental consultant - 2 wheels;Cane - single point;Walker - 4 wheels          Prior Functioning/Environment Level of Independence: Independent                 OT Problem List: Decreased strength;Decreased activity tolerance;Impaired balance (sitting and/or standing);Decreased cognition;Cardiopulmonary status limiting activity      OT Treatment/Interventions: Self-care/ADL training;Therapeutic exercise;Energy conservation;DME and/or AE instruction;Therapeutic activities;Balance training    OT Goals(Current goals can be found in the care plan section) Acute Rehab OT Goals Patient Stated Goal: to feel better OT Goal Formulation: With patient Time For Goal Achievement: 06/28/20 Potential to Achieve Goals: Good ADL Goals Pt Will Perform Grooming: with supervision;standing Pt Will Perform Lower Body Dressing: with min guard assist;sit to/from stand Pt Will Transfer to Toilet: with min guard assist;ambulating;bedside commode Pt Will Perform Toileting - Clothing Manipulation and hygiene: sitting/lateral leans;sit to/from stand;with supervision Additional ADL Goal #1: Pt will tolerate x10 mins of OOB ADL tasks with minguardA overall for stability and less than 2 seated rest breaks to continue to  assess safety and independence with ADL. Additional ADL Goal #2: Pt will state and utilize 3 energy conservation techniques for ADL and mobility in order to increase activity tolerance.  OT Frequency: Min 2X/week   Barriers to D/C:            Co-evaluation PT/OT/SLP Co-Evaluation/Treatment: Yes Reason for Co-Treatment: To address functional/ADL transfers;For patient/therapist safety PT goals addressed during session: Mobility/safety with mobility;Balance OT goals addressed during session: ADL's and self-care      AM-PAC OT "6 Clicks" Daily Activity     Outcome Measure Help from another person eating meals?: None Help from another person taking care of personal grooming?: A Little Help from another person toileting, which includes using toliet, bedpan, or urinal?: A Lot Help from another person bathing (including washing, rinsing, drying)?: A Little Help from another person to put on and taking off regular upper body clothing?: A Little Help from another person to put on and taking off regular lower body clothing?: A Lot 6 Click Score: 17   End of Session Nurse Communication: Mobility status  Activity Tolerance: Patient tolerated treatment well Patient left: in bed;with call bell/phone within reach  OT Visit Diagnosis: Unsteadiness on feet (R26.81);Muscle weakness (generalized) (M62.81)                Time: 1030-1109 OT Time Calculation (min): 39 min Charges:  OT General Charges $OT Visit: 1 Visit OT Evaluation $OT Eval Moderate Complexity: [place], OTR/L Acute Rehabilitation Services Pager: [phone] Office: [phone]  Nasire Reali C 06/14/2020, 1:58 PM

## 2020-06-14 NOTE — Progress Notes (Signed)
   06/14/20 2301  Assess: MEWS Score  Temp 97.9 F (36.6 C)  BP (!) 81/62  Pulse Rate 81  ECG Heart Rate 84  Resp (!) 29  Level of Consciousness Alert  SpO2 92 %  O2 Device Room Air  Assess: MEWS Score  MEWS Temp 0  MEWS Systolic 1  MEWS Pulse 0  MEWS RR 2  MEWS LOC 0  MEWS Score 3  MEWS Score Color Yellow  Assess: if the MEWS score is Yellow or Red  Were vital signs taken at a resting state? Yes  Focused Assessment No change from prior assessment  Early Detection of Sepsis Score *See Row Information* Low  MEWS guidelines implemented *See Row Information* Yes  Take Vital Signs  Increase Vital Sign Frequency  Yellow: Q 2hr X 2 then Q 4hr X 2, if remains yellow, continue Q 4hrs  Escalate  MEWS: Escalate Yellow: discuss with charge nurse/RN and consider discussing with provider and RRT  Notify: Charge Nurse/RN  Name of Charge Nurse/RN Notified Tressie Ellis, RN  Date Charge Nurse/RN Notified 06/14/20  Time Charge Nurse/RN Notified 2301  Document  Patient Outcome Not stable and remains on department  Progress note created (see row info) Yes

## 2020-06-14 NOTE — Progress Notes (Addendum)
PROGRESS NOTE                                                                                                                                                                                                             Patient Demographics:    [name] [name], is a 86 y.o. female, DOB - [birth year], APO:[id number]  Outpatient Primary MD for the patient is [name], MD   Admit date - 06/13/2020   LOS - 0  Chief Complaint  Patient presents with  . Shortness of Breath       Brief Narrative: Patient is a 86 y.o. female with PMHx of chronic diastolic heart failure, chronic atrial fibrillation, sick sinus syndrome-s/p PPM implantation, nonobstructive CAD,  presented to the hospital with a 3-day history of fever, weakness and poor oral intake.  Her daughter was recently diagnosed with COVID-19.  She was found to have COVID-19 infection, severe hypokalemia along with A. fib with RVR and admitted to the hospitalist service.  See below for further details.   COVID-19 vaccinated status: Vaccinated  Significant Events: 11/30>> Admit to [facility] for A. fib with RVR, hyperkalemia-COVID-19 positive.  Significant studies: 11/30>> chest x-ray: Small left pleural effusion/mid left lung atelectatic changes.  COVID-19 medications: None  Antibiotics: None  Microbiology data: 11/30 >>blood culture: No growth  Procedures: None  Consults: None  DVT prophylaxis: apixaban (ELIQUIS) tablet 5 mg     Subjective:    [name] today feels somewhat better-still complains of weakness-no nausea-had some diarrhea overnight.  Thinks her appetite has improved somewhat but has not yet eaten breakfast this morning when I had seen her.   Assessment  & Plan :   Breakthrough COVID-19 infection: Fully vaccinated-claims second dose of vaccine was in February 2021.  He recently exposed-daughter found to be COVID-19 positive.  Thankfully she does  not have any major respiratory symptoms-chest x-ray without pneumonia.  Stable on room air.  Given advanced age and medical comorbidities-she remains at risk for severe disease.  Discussed risk/benefits/rationale for monoclonal antibody infusion-she consents-have consulted pharmacy in the ED.  Monitor closely-if she were to start having respiratory symptoms-we will then start steroid/Remdesivir.Marland Kitchen   COVID-19 Labs: Recent Labs    06/13/20 2149 06/14/20 0748  DDIMER 0.80* 0.62*  FERRITIN 145  --   LDH 166  --   CRP 12.4* 13.5*  Component Value Date/Time   BNP 349.8 (H) 06/13/2020 2141   BNP 207.3 (H) 06/20/2016 1130    Recent Labs  Lab 06/13/20 2149  PROCALCITON <0.10    Lab Results  Component Value Date   SARSCOV2NAA NEGATIVE 03/24/2020   SARSCOV2NAA NEGATIVE 03/16/2020   SARSCOV2NAA NEGATIVE 03/13/2020   [place] NEGATIVE 02/21/2020    Chronic atrial fibrillation with RVR: Rate better controlled-on Cardizem-and Eliquis.  Per patient-she has not taken any oral medications for the past 2 days due to poor oral intake and nausea.  Chronic diastolic heart failure: Clinically euvolemic-resume diuretics.  History of sick sinus syndrome-s/p permanent pacemaker implantation  HTN: BP reasonably well-controlled-continue Cardizem, labetalol  Hypokalemia/hypomagnesemia: Secondary to nausea/vomiting-along with poor oral intake-repleted.  Prolonged QTC: Secondary to electrolyte abnormalities-repeat EKG today  Hypothyroidism: Continue with Synthroid  Depression: Stable-resume Lexapro once QTC normalizes.  Deconditioning/debility: Needs PT/OT eval to determine safe disposition-seems weak/deconditioned due to acute COVID-19 infection.   ABG:    Component Value Date/Time   TCO2 22 03/15/2020 1017    Vent Settings: N/A  Condition -Guarded  Family Communication  :  Spouse Zulema Pulaski [phone]) over the phone 12/1  Code Status : DNR  Diet :  Diet Order             Diet Heart Room service appropriate? Yes; Fluid consistency: Thin; Fluid restriction: 1200 mL Fluid  Diet effective now                  Disposition Plan  :   Status is: Observation  The patient remains OBS appropriate and will d/c before 2 midnights.  Dispo: The patient is from: Home              Anticipated d/c is to: HHPT              Anticipated d/c date is: 1 day              Patient currently is not medically stable to d/c.   Barriers to discharge: Profoundly weak-but improving-getting monoclonal antibody today-needs PT/OT eval-another 24 hours of monitoring to ensure that she is clinically improving-to ensure safe discharge plan.    Antimicorbials  :    Anti-infectives (From admission, onward)   None      Inpatient Medications  Scheduled Meds: . apixaban  5 mg Oral BID  . vitamin C  500 mg Oral Daily  . diltiazem  120 mg Oral Daily  . escitalopram  10 mg Oral Daily  . labetalol  300 mg Oral BID  . levothyroxine  25 mcg Oral QAC breakfast  . zinc sulfate  220 mg Oral Daily   Continuous Infusions: . sodium chloride    . casirivimab-imdevimab (REGEN-COV) IVPB     PRN Meds:.sodium chloride, hydrOXYzine, methylPREDNISolone (SOLU-MEDROL) injection, traMADol   Time Spent in minutes  25  See all Orders from today for further details   Oren Binet M.D on 06/14/2020 at 10:21 AM  To page go to [email or web address] - use universal password  [facility] -  Office  [phone]    Objective:   Vitals:   06/14/20 0801 06/14/20 0830 06/14/20 0915 06/14/20 0929  BP: 123/79 124/74 (!) 116/101 139/61  Pulse: 81 84 76 69  Resp: (!) 33 (!) 23 (!) 24 (!) 25  Temp:      TempSrc:      SpO2: 93% 96% 97% 98%  Weight:      Height:        Wt  Readings from Last 3 Encounters:  06/13/20 65.8 kg  05/03/20 65.8 kg  04/04/20 68 kg     Intake/Output Summary (Last 24 hours) at 06/14/2020 1021 Last data filed at 06/14/2020 0005 Gross per 24 hour  Intake 100 ml   Output --  Net 100 ml     Physical Exam Gen Exam:Alert awake-not in any distress HEENT:atraumatic, normocephalic Chest: B/L clear to auscultation anteriorly CVS:S1S2 regular Abdomen:soft non tender, non distended Extremities:no edema Neurology: Non focal Skin: no rash   Data Review:    CBC Recent Labs  Lab 06/13/20 2149 06/14/20 0748  WBC 4.1 2.5*  HGB 11.1* 12.0  HCT 33.2* 37.3  PLT 180 188  MCV 94.9 97.6  MCH 31.7 31.4  MCHC 33.4 32.2  RDW 14.8 14.8  LYMPHSABS 0.7 0.5*  MONOABS 0.5 0.2  EOSABS 0.0 0.0  BASOSABS 0.0 0.0    Chemistries  Recent Labs  Lab 06/13/20 2149 06/13/20 2304 06/14/20 0254 06/14/20 0748  NA 136  --   --  135  K 2.2*  --   --  5.0  CL 99  --   --  102  CO2 24  --   --  20*  GLUCOSE 140*  --   --  159*  BUN 8  --   --  9  CREATININE 0.87  --   --  0.75  CALCIUM 8.6*  --   --  8.4*  MG  --  1.4* 1.6* 2.2  AST 25  --   --  28  ALT 15  --   --  14  ALKPHOS 58  --   --  55  BILITOT 1.0  --   --  0.9   ------------------------------------------------------------------------------------------------------------------ Recent Labs    06/13/20 2149  TRIG 73    Lab Results  Component Value Date   HGBA1C 5.5 03/27/2015   ------------------------------------------------------------------------------------------------------------------ Recent Labs    06/14/20 0254  TSH 1.219   ------------------------------------------------------------------------------------------------------------------ Recent Labs    06/13/20 2149  FERRITIN 145    Coagulation profile No results for input(s): INR, PROTIME in the last 168 hours.  Recent Labs    06/13/20 2149 06/14/20 0748  DDIMER 0.80* 0.62*    Cardiac Enzymes No results for input(s): CKMB, TROPONINI, MYOGLOBIN in the last 168 hours.  Invalid input(s): CK ------------------------------------------------------------------------------------------------------------------     Component Value Date/Time   BNP 349.8 (H) 06/13/2020 2141   BNP 207.3 (H) 06/20/2016 1130    Micro Results Recent Results (from the past 240 hour(s))  Blood Culture (routine x 2)     Status: None (Preliminary result)   Collection Time: 06/13/20  9:49 PM   Specimen: BLOOD LEFT HAND  Result Value Ref Range Status   Specimen Description BLOOD LEFT HAND  Final   Special Requests   Final    BOTTLES DRAWN AEROBIC AND ANAEROBIC Blood Culture adequate volume   Culture   Final    NO GROWTH < 12 HOURS Performed at [facility], [place] [address], [place], [place] [address]    Report Status PENDING  Incomplete  Blood Culture (routine x 2)     Status: None (Preliminary result)   Collection Time: 06/13/20 11:26 PM   Specimen: BLOOD RIGHT HAND  Result Value Ref Range Status   Specimen Description BLOOD RIGHT HAND  Final   Special Requests   Final    BOTTLES DRAWN AEROBIC AND ANAEROBIC Blood Culture adequate volume   Culture   Final  NO GROWTH < 12 HOURS Performed at [facility] [address], [place], [place] [address]    Report Status PENDING  Incomplete    Radiology Reports DG Chest Port 1 View  Result Date: 06/13/2020 CLINICAL DATA:  Shortness of breath and weakness for 1 week, COVID-19 positivity, initial encounter EXAM: PORTABLE CHEST 1 VIEW COMPARISON:  01/13/2019 FINDINGS: Cardiac shadow is enlarged. Pacing device is again seen and stable. Aortic calcifications are noted. Right lung is clear. Small left pleural effusion is noted. Mild left midlung atelectatic changes are noted. No bony abnormality is noted. IMPRESSION: Small left pleural effusion and left mid lung atelectatic changes. Electronically Signed   By: Inez Catalina M.D.   On: 06/13/2020 21:35

## 2020-06-14 NOTE — H&P (Addendum)
History and Physical    [name] [name] HMC:[id number] DOB: [birth year] DOA: 06/13/2020   PCP: [name], MD  Patient coming from: Home.  Chief Complaint: Fever weakness and shortness of breath.  HPI: [name] [name] is a 86 y.o. female with history of chronic diastolic CHF last EF measured was 55 to 60% in February 2021 recently admitted for recurrent GI bleed from hemorrhoids and after hemorrhoidectomy has been experiencing fatigue and weakness for the last 2 to 3 days.  Patient states her daughter was recently diagnosed with Covid infection and she was exposed to her.  Last 2 to 3 days patient has been feeling weak had random temperatures of around 101 degrees at home and has been mildly short of breath.  Denies any chest pain.  Has been having poor appetite with at times vomiting no abdominal pain or diarrhea.  Has not taken her medicines for last 2 days because of the nausea and vomiting.  ED Course: In the ER patient was not febrile or hypoxic.  By the time I examined the patient patient was in A. fib with RVR at the rate of 120 bpm.  Chest x-ray shows mild pleural effusion Covid test is positive.  Labs are significant for severe hypokalemia and hypomagnesemia with potassium of 2.2 and magnesium of 1.4.  BNP is 349.8 lactic acid normal procalcitonin negative CRP 12.4.  Review of Systems: As per HPI, rest all negative.   Past Medical History:  Diagnosis Date  . Anemia due to chronic blood loss 12/2019   rectal bleeding  . Bilateral lower extremity edema   . CAD (coronary artery disease) cardiologist-- dr Caryl Comes   a. LHC 10/2017 30% RCA otherwise OK.  (nonobstructive cad)  . Chronic diastolic CHF (congestive heart failure) ([place])    followed by dr Caryl Comes  . Chronic venous insufficiency   . Hemorrhoids   . History of adenomatous polyp of colon   . History of hemorrhagic cerebrovascular accident (CVA) without residual deficits 03/25/2015   (previous seen by neuologist--- dr Erlinda Hong   ICH  left posterior parietal in setting  HTN/ and pt taking coumadin;   per pt no residual  . History of kidney stones   . History of recurrent UTIs   . HLD (hyperlipidemia)   . HTN (hypertension)    followed by dr Caryl Comes and pcp  . Hypothyroidism    followed by pcp  . NASH (nonalcoholic steatohepatitis)    hx medication induced hepatitis (vytorin per pt)  . OA (osteoarthritis)   . Pacemaker-[employer] 07/29/2011  implanted   followed by dr Caryl Comes  . Permanent atrial fibrillation [facility])    cardiology--- dr Caryl Comes--- hx several DCCVs  . PMR (polymyalgia rheumatica) ([place])    03-14-2020  per pt has not had any issues in several years  . Rectal bleeding   . sinus node dysfunction//post termination pauses   . SSS (sick sinus syndrome) ([place])    s/p  PPM 07-29-2011  . Thoracic aortic aneurysm (TAA) ([place])    a. followed by Dr. Servando Snare.    Past Surgical History:  Procedure Laterality Date  . BIOPSY  02/23/2020   Procedure: BIOPSY;  Surgeon: Clarene Essex, MD;  Location: [facility] ENDOSCOPY;  Service: Endoscopy;;  . CARDIAC CATHETERIZATION  12-05-2006  dr cooper   nonobstructive cad  . CARDIAC PACEMAKER PLACEMENT  07-29-2011   dr Valaria Good (dual)  . CARDIOVERSION  03/02/2012   Procedure: CARDIOVERSION;  Surgeon: Lelon Perla, MD;  Location[facility]  ENDOSCOPY;  Service: Cardiovascular;  Laterality: N/A;  . CARDIOVERSION  03/13/2012   Procedure: CARDIOVERSION;  Surgeon: Josue Hector, MD;  Location: [place];  Service: Cardiovascular;  Laterality: N/A;  . CARDIOVERSION  04/15/2012   Procedure: CARDIOVERSION;  Surgeon: [name], MD;  Location: [place];  Service: Cardiovascular;  Laterality: N/A;  . CARDIOVERSION N/A 06/07/2014   Procedure: CARDIOVERSION;  Surgeon: [name], MD;  Location: [facility] ENDOSCOPY;  Service: Cardiovascular;  Laterality: N/A;  . CARDIOVERSION N/A 08/18/2014   Procedure: CARDIOVERSION;  Surgeon: [name], MD;  Location: [place];  Service:  Cardiovascular;  Laterality: N/A;  . CARDIOVERSION N/A 09/06/2015   Procedure: CARDIOVERSION;  Surgeon: Jerline Pain, MD;  Location: [place];  Service: Cardiovascular;  Laterality: N/A;  . COLONOSCOPY N/A 02/23/2020   Procedure: COLONOSCOPY possible flex sig only;  Surgeon: Clarene Essex, MD;  Location: [facility] ENDOSCOPY;  Service: Endoscopy;  Laterality: N/A;  . CYSTOSCOPY WITH RETROGRADE PYELOGRAM, URETEROSCOPY AND STENT PLACEMENT Right 10/ 2017  @[facility]   . CYSTOSCOPY WITH STENT PLACEMENT Right 04/22/2016   Procedure: CYSTOSCOPY WITH RIGHT URETERAL STENT INSERTION;  Surgeon: [name], MD;  Location: [place];  Service: Urology;  Laterality: Right;  . CYSTOSCOPY WITH URETEROSCOPY, STONE BASKETRY AND STENT PLACEMENT Right 04/30/2016   Procedure: CYSTOSCOPY WITH URETEROSCOPY, STENT PLACEMENT REMOVAL;  Surgeon: [name], MD;  Location: [facility] ORS;  Service: Urology;  Laterality: Right;  . HEMORRHOID SURGERY N/A 03/15/2020   Procedure: 3 COLUMN HEMORRHOIDECTOMY;  Surgeon: Leighton Ruff, MD;  Location: [facility];  Service: General;  Laterality: N/A;  . [place]  . LEFT HEART CATH AND CORONARY ANGIOGRAPHY N/A 10/28/2017   Procedure: LEFT HEART CATH AND CORONARY ANGIOGRAPHY;  Surgeon: Lorretta Harp, MD;  Location: [place] CV LAB;  Service: Cardiovascular;  Laterality: N/A;  . PERMANENT PACEMAKER INSERTION N/A 07/29/2011   Procedure: PERMANENT PACEMAKER INSERTION;  Surgeon: [name], MD;  Location: [facility] CATH LAB;  Service: Cardiovascular;  Laterality: N/A;  . UMBILICAL HERNIA REPAIR  1950's     reports that she quit smoking about 29 years ago. Her smoking use included cigarettes. She quit after 20.00 years of use. She has never used smokeless tobacco. She reports that she does not drink alcohol and does not use drugs.  Allergies  Allergen Reactions  . Penicillins Anaphylaxis    Has patient had a PCN reaction causing immediate rash,  facial/tongue/throat swelling, SOB or lightheadedness with hypotension: Yes Has patient had a PCN reaction causing severe rash involving mucus membranes or skin necrosis: No Has patient had a PCN reaction that required hospitalization No Has patient had a PCN reaction occurring within the last 10 years: No If all of the above answers are "NO", then may proceed with Cephalosporin use.   . Statins Other (See Comments)    REACTION: elevated LFT's  . Tylenol [Acetaminophen] Other (See Comments)    If taken with Vytorin at risk for liver damage  . Adhesive [Tape] Other (See Comments)    Irritates skin  . Amiodarone Other (See Comments)    unk  . Quinolones Other (See Comments)    UNk  . Tape Other (See Comments)    Redness, Please use "paper" tape only.  . Codeine Other (See Comments)    feel funny, head is fuzzy    Family History  Problem Relation Age of Onset  . Coronary artery disease Mother   . Coronary artery disease Other   .  Alzheimer's disease Other   . Coronary artery disease Brother   . Coronary artery disease Brother     Prior to Admission medications   Medication Sig Start Date End Date Taking? Authorizing Provider  acetaminophen (TYLENOL) 500 MG tablet Take 1,000 mg by mouth every 6 (six) hours as needed for mild pain.    [provider]  apixaban (ELIQUIS) 5 MG TABS tablet Take 1 tablet (5 mg total) by mouth 2 (two) times daily. 02/27/20   Nita Sells, MD  brompheniramine-pseudoephedrine-DM 30-2-10 MG/5ML syrup Take by mouth. 06/12/20   [provider]  diltiazem (CARDIZEM CD) 120 MG 24 hr capsule Take 1 capsule (120 mg total) by mouth 2 (two) times daily. 04/04/20   Baldwin Jamaica, PA-C  escitalopram (LEXAPRO) 10 MG tablet Take 10 mg by mouth daily.    [provider]  feeding supplement, ENSURE ENLIVE, (ENSURE ENLIVE) LIQD Take 237 mLs by mouth 2 (two) times daily between meals. 03/24/20   Guilford Shi, MD  furosemide (LASIX)  40 MG tablet Take 1 tablet (40 mg total) by mouth daily. 05/03/20 08/01/20  Baldwin Jamaica, PA-C  hydrOXYzine (ATARAX/VISTARIL) 10 MG tablet Take 1 tablet (10 mg total) by mouth 3 (three) times daily as needed for anxiety. 03/24/20   Guilford Shi, MD  labetalol (NORMODYNE) 300 MG tablet TAKE 1 TABLET BY MOUTH IN THE MORNING AND 1 & 1/2 TABLET IN THE EVENING. 05/15/20   [name], MD  levothyroxine (SYNTHROID, LEVOTHROID) 25 MCG tablet Take 25 mcg by mouth daily before breakfast.     [provider]  polyvinyl alcohol (LIQUIFILM TEARS) 1.4 % ophthalmic solution Place 1 drop into both eyes as needed for dry eyes. 03/24/20   Guilford Shi, MD  potassium chloride (KLOR-CON) 10 MEQ tablet Take 1 tablet (10 mEq total) by mouth daily. 04/06/20 07/05/20  Baldwin Jamaica, PA-C  psyllium (HYDROCIL/METAMUCIL) 95 % PACK Take 1 packet by mouth daily. 03/25/20   Guilford Shi, MD  traMADol (ULTRAM) 50 MG tablet Take 50 mg by mouth as needed. 04/25/20   [provider]    Physical Exam: Constitutional: Moderately built and nourished. Vitals:   06/13/20 2345 06/14/20 0015 06/14/20 0030 06/14/20 0145  BP: 110/67 (!) 138/126 137/78 (!) 157/94  Pulse: (!) 108 87 (!) 107 (!) 118  Resp: (!) 29 (!) 28 (!) 27 20  Temp:      TempSrc:      SpO2: 97% 96% 96% 96%  Weight:      Height:       Eyes: Anicteric no pallor. ENMT: No discharge from the ears eyes nose or mouth. Neck: No mass felt.  No neck rigidity. Respiratory: No rhonchi or crepitations. Cardiovascular: S1-S2 heard. Abdomen: Soft nontender bowel sounds present. Musculoskeletal: No edema. Skin: No rash. Neurologic: Alert awake oriented to time place and person.  Moves all extremities. Psychiatric: Appears normal.  Normal affect.   Labs on Admission: I have personally reviewed following labs and imaging studies  CBC: Recent Labs  Lab 06/13/20 2149  WBC 4.1  NEUTROABS 2.8  HGB 11.1*  HCT 33.2*  MCV 94.9    PLT 532   Basic Metabolic Panel: Recent Labs  Lab 06/13/20 2149 06/13/20 2304  NA 136  --   K 2.2*  --   CL 99  --   CO2 24  --   GLUCOSE 140*  --   BUN 8  --   CREATININE 0.87  --   CALCIUM 8.6*  --  MG  --  1.4*   GFR: Estimated Creatinine Clearance: 41 mL/min (by C-G formula based on SCr of 0.87 mg/dL). Liver Function Tests: Recent Labs  Lab 06/13/20 2149  AST 25  ALT 15  ALKPHOS 58  BILITOT 1.0  PROT 4.7*  ALBUMIN 2.4*   No results for input(s): LIPASE, AMYLASE in the last 168 hours. No results for input(s): AMMONIA in the last 168 hours. Coagulation Profile: No results for input(s): INR, PROTIME in the last 168 hours. Cardiac Enzymes: No results for input(s): CKTOTAL, CKMB, CKMBINDEX, TROPONINI in the last 168 hours. BNP (last 3 results) No results for input(s): PROBNP in the last 8760 hours. HbA1C: No results for input(s): HGBA1C in the last 72 hours. CBG: No results for input(s): GLUCAP in the last 168 hours. Lipid Profile: Recent Labs    06/13/20 2149  TRIG 73   Thyroid Function Tests: No results for input(s): TSH, T4TOTAL, FREET4, T3FREE, THYROIDAB in the last 72 hours. Anemia Panel: Recent Labs    06/13/20 2149  FERRITIN 145   Urine analysis:    Component Value Date/Time   COLORURINE YELLOW 03/17/2020 1125   APPEARANCEUR CLEAR 03/17/2020 1125   LABSPEC 1.013 03/17/2020 1125   PHURINE 5.0 03/17/2020 1125   GLUCOSEU NEGATIVE 03/17/2020 1125   HGBUR MODERATE (A) 03/17/2020 1125   BILIRUBINUR NEGATIVE 03/17/2020 1125   KETONESUR NEGATIVE 03/17/2020 1125   PROTEINUR 30 (A) 03/17/2020 1125   UROBILINOGEN 0.2 01/14/2016 1337   NITRITE NEGATIVE 03/17/2020 1125   LEUKOCYTESUR LARGE (A) 03/17/2020 1125   Sepsis Labs: @LABRCNTIP (procalcitonin:4,lacticidven:4) )No results found for this or any previous visit (from the past 240 hour(s)).   Radiological Exams on Admission: DG Chest Port 1 View  Result Date: 06/13/2020 CLINICAL DATA:   Shortness of breath and weakness for 1 week, COVID-19 positivity, initial encounter EXAM: PORTABLE CHEST 1 VIEW COMPARISON:  01/13/2019 FINDINGS: Cardiac shadow is enlarged. Pacing device is again seen and stable. Aortic calcifications are noted. Right lung is clear. Small left pleural effusion is noted. Mild left midlung atelectatic changes are noted. No bony abnormality is noted. IMPRESSION: Small left pleural effusion and left mid lung atelectatic changes. Electronically Signed   By: Inez Catalina M.D.   On: 06/13/2020 21:35    EKG: Independently reviewed.  A. fib rate of 104 bpm.  Assessment/Plan Principal Problem:   Atrial fibrillation with RVR (HCC) Active Problems:   Essential hypertension   Chronic diastolic heart failure (HCC)   Hypothyroidism   COVID    1. A. fib with RVR likely precipitated by patient not taking her medications for last 2 to 3 days.  I have ordered Cardizem CD 120 mg now which patient usually takes at home.  We will closely monitor in progressive care check TSH cardiac markers.  Likely precipitating her shortness of breath.  Patient is on apixaban has not taken for last 2 days. 2. Chronic diastolic CHF last EF measured in February 2021 was 55 to 60%.  Usually takes Lasix.  Will start Lasix once patient potassium is corrected since patient has significantly low potassium.  Patient's CHF may be mildly exacerbated because of the A. fib with RVR likely contributing to her shortness of breath. 3. COVID-19 infection presently not hypoxic and not febrile.  Influenza marker CRP is mildly elevated at 12.  We will continue to monitor respiratory status closely follow including markers and also monitor oxygen saturation.  I have not started steroids at this time.  But if there is any  signs of hypoxia or developing infiltrates in the x-rays will start. 4. Hypertensive urgency likely from not taking her medications including labetalol and Cardizem which I have restarted.  Likely  causing her shortness of breath. 5. Hypokalemia and hypomagnesemia likely from poor oral intake and nausea vomiting and also could be from diuretics.  Replace and recheck. 6. Nausea vomiting.  We will closely monitor.  Abdomen appears benign at this time.  Could be from Covid infection. 7. History of recurrent GI bleed from hemorrhoids recently with anemia follow CBC. 8. History of pacemaker placement for sick sinus syndrome.   DVT prophylaxis: Apixaban. Code Status: DNR. Family Communication: Discussed with patient. Disposition Plan: Home. Consults called: None. Admission status: Observation.   Rise Patience MD [facility] Pager [phone].  If 7PM-7AM, please contact night-coverage [email or web address] Password [facility]  06/14/2020, 2:48 AM

## 2020-06-14 NOTE — Evaluation (Signed)
Physical Therapy Evaluation Patient Details Name: [name] [name] MRN: [id number] DOB: [birth year] Today's Date: 06/14/2020   History of Present Illness  Pt is an 87 y/o female admitted secondary to SOB and weakness. Found to have a fib with RVR and is covid +. PMH includes HTN, dCHF, CAD.   Clinical Impression  Pt admitted secondary to problem above with deficits below. Pt very anxious with mobility and incontinent of bowels upon standing, so further mobility limited. Required min guard A +2 for safety to stand from higher stretcher and min A for bed mobility. Anticipate pt will progress well and be able to d/c home with family and HHPT. Will continue to follow acutely.     Follow Up Recommendations Home health PT;Supervision for mobility/OOB    Equipment Recommendations  Other (comment) (TBD)    Recommendations for Other Services       Precautions / Restrictions Precautions Precautions: Fall Precaution Comments: Pt very fearful of falling. Has increased anxiety over fear of falling.  Restrictions Weight Bearing Restrictions: No      Mobility  Bed Mobility Overal bed mobility: Needs Assistance Bed Mobility: Supine to Sit;Sit to Supine;Rolling Rolling: Supervision;+2 for safety/equipment   Supine to sit: Min assist;+2 for safety/equipment Sit to supine: Supervision   General bed mobility comments: Min A for trunk elevation. Heavy use of rail to come to sitting. Supervision to return to supine. Pt with incontence upon standing, so after return to supine, required supervision and +2 for assist with clean up.     Transfers Overall transfer level: Needs assistance Equipment used: 1 person hand held assist Transfers: Sit to/from Stand Sit to Stand: Min guard;+2 safety/equipment         General transfer comment: Min guard for safety to stand from stretcher. Pt stood X2. Pt incontinent of bowels. Pt very anxious upon standing.   Ambulation/Gait                 Stairs            Wheelchair Mobility    Modified Rankin (Stroke Patients Only)       Balance Overall balance assessment: Needs assistance Sitting-balance support: No upper extremity supported;Feet supported Sitting balance-Leahy Scale: Fair     Standing balance support: No upper extremity supported;Single extremity supported Standing balance-Leahy Scale: Fair Standing balance comment: Able to maintain static standing without UE support                              Pertinent Vitals/Pain Pain Assessment: No/denies pain    Home Living Family/patient expects to be discharged to:: Private residence Living Arrangements: Spouse/significant other Available Help at Discharge: Family;Available 24 hours/day Type of Home: House Home Access: Stairs to enter Entrance Stairs-Rails: None Entrance Stairs-Number of Steps: 2 Home Layout: One level Home Equipment: Walker - 2 wheels;Cane - single point;Walker - 4 wheels      Prior Function Level of Independence: Independent               Hand Dominance        Extremity/Trunk Assessment   Upper Extremity Assessment Upper Extremity Assessment: Defer to OT evaluation    Lower Extremity Assessment Lower Extremity Assessment: Generalized weakness    Cervical / Trunk Assessment Cervical / Trunk Assessment: Kyphotic  Communication   Communication: No difficulties  Cognition Arousal/Alertness: Awake/alert Behavior During Therapy: Anxious Overall Cognitive Status: Within Functional Limits for tasks assessed  General Comments: Pt very anxious about performing mobility tasks. Also reports she is very fearful of falling.       General Comments General comments (skin integrity, edema, etc.): VSS throughout     Exercises     Assessment/Plan    PT Assessment Patient needs continued PT services  PT Problem List Decreased strength;Decreased balance;Decreased  activity tolerance;Decreased mobility;Decreased knowledge of use of DME;Decreased knowledge of precautions       PT Treatment Interventions Gait training;DME instruction;Functional mobility training;Stair training;Therapeutic activities;Therapeutic exercise;Balance training;Patient/family education    PT Goals (Current goals can be found in the Care Plan section)  Acute Rehab PT Goals Patient Stated Goal: to feel better PT Goal Formulation: With patient Time For Goal Achievement: 06/28/20 Potential to Achieve Goals: Good    Frequency Min 3X/week   Barriers to discharge        Co-evaluation PT/OT/SLP Co-Evaluation/Treatment: Yes Reason for Co-Treatment: For patient/therapist safety;To address functional/ADL transfers PT goals addressed during session: Mobility/safety with mobility;Balance         AM-PAC PT "6 Clicks" Mobility  Outcome Measure Help needed turning from your back to your side while in a flat bed without using bedrails?: A Little Help needed moving from lying on your back to sitting on the side of a flat bed without using bedrails?: A Little Help needed moving to and from a bed to a chair (including a wheelchair)?: A Little Help needed standing up from a chair using your arms (e.g., wheelchair or bedside chair)?: A Little Help needed to walk in hospital room?: A Little Help needed climbing 3-5 steps with a railing? : A Lot 6 Click Score: 17    End of Session   Activity Tolerance: Patient tolerated treatment well Patient left: in bed;with call bell/phone within reach (on stretcher in ED ) Nurse Communication: Mobility status PT Visit Diagnosis: Muscle weakness (generalized) (M62.81);Other abnormalities of gait and mobility (R26.89);Difficulty in walking, not elsewhere classified (R26.2)    Time: 1583-0940 PT Time Calculation (min) (ACUTE ONLY): 33 min   Charges:   PT Evaluation $PT Eval Moderate Complexity: 1 Mod          [name], PT, DPT  Acute  Rehabilitation Services  Pager: [phone] Office: [phone]   [name] 06/14/2020, 12:39 PM

## 2020-06-14 NOTE — ED Notes (Addendum)
Patient found to be on room air with O2 sat of 97%.

## 2020-06-15 DIAGNOSIS — Z23 Encounter for immunization: Secondary | ICD-10-CM | POA: Diagnosis not present

## 2020-06-15 DIAGNOSIS — Z950 Presence of cardiac pacemaker: Secondary | ICD-10-CM | POA: Diagnosis not present

## 2020-06-15 DIAGNOSIS — U071 COVID-19: Secondary | ICD-10-CM

## 2020-06-15 DIAGNOSIS — I2510 Atherosclerotic heart disease of native coronary artery without angina pectoris: Secondary | ICD-10-CM | POA: Diagnosis not present

## 2020-06-15 DIAGNOSIS — I5032 Chronic diastolic (congestive) heart failure: Secondary | ICD-10-CM | POA: Diagnosis not present

## 2020-06-15 DIAGNOSIS — I10 Essential (primary) hypertension: Secondary | ICD-10-CM | POA: Diagnosis not present

## 2020-06-15 DIAGNOSIS — E039 Hypothyroidism, unspecified: Secondary | ICD-10-CM | POA: Diagnosis not present

## 2020-06-15 DIAGNOSIS — Z87891 Personal history of nicotine dependence: Secondary | ICD-10-CM | POA: Diagnosis not present

## 2020-06-15 DIAGNOSIS — I4891 Unspecified atrial fibrillation: Secondary | ICD-10-CM | POA: Diagnosis not present

## 2020-06-15 DIAGNOSIS — Z955 Presence of coronary angioplasty implant and graft: Secondary | ICD-10-CM | POA: Diagnosis not present

## 2020-06-15 DIAGNOSIS — I110 Hypertensive heart disease with heart failure: Secondary | ICD-10-CM | POA: Diagnosis not present

## 2020-06-15 LAB — BASIC METABOLIC PANEL
Anion gap: 14 (ref 5–15)
BUN: 12 mg/dL (ref 8–23)
CO2: 23 mmol/L (ref 22–32)
Calcium: 9 mg/dL (ref 8.9–10.3)
Chloride: 100 mmol/L (ref 98–111)
Creatinine, Ser: 0.81 mg/dL (ref 0.44–1.00)
GFR, Estimated: >60 mL/min (ref >60)
Glucose, Bld: 114 mg/dL — ABNORMAL HIGH (ref 70–99)
Potassium: 3.7 mmol/L (ref 3.5–5.1)
Sodium: 137 mmol/L (ref 135–145)

## 2020-06-15 LAB — CBC
HCT: 34.2 % — ABNORMAL LOW (ref 36.0–46.0)
Hemoglobin: 11.7 g/dL — ABNORMAL LOW (ref 12.0–15.0)
MCH: 32 pg (ref 26.0–34.0)
MCHC: 34.2 g/dL (ref 30.0–36.0)
MCV: 93.4 fL (ref 80.0–100.0)
Platelets: 200 10*3/uL (ref 150–400)
RBC: 3.66 MIL/uL — ABNORMAL LOW (ref 3.87–5.11)
RDW: 14.7 % (ref 11.5–15.5)
WBC: 7.1 10*3/uL (ref 4.0–10.5)
nRBC: 0 % (ref 0.0–0.2)

## 2020-06-15 LAB — MRSA PCR SCREENING: MRSA by PCR: NEGATIVE

## 2020-06-15 LAB — MAGNESIUM: Magnesium: 2 mg/dL (ref 1.7–2.4)

## 2020-06-15 MED ORDER — POTASSIUM CHLORIDE ER 10 MEQ PO TBCR: 20.0000 meq | EXTENDED_RELEASE_TABLET | Freq: Every day | ORAL | 1 refills | Status: AC

## 2020-06-15 MED ORDER — POTASSIUM CHLORIDE CRYS ER 20 MEQ PO TBCR
40.0000 meq | EXTENDED_RELEASE_TABLET | Freq: Once | ORAL | Status: AC
  Administered 2020-06-15: 40 meq via ORAL
  Filled 2020-06-15: qty 2

## 2020-06-15 NOTE — Discharge Summary (Signed)
PATIENT DETAILS Name: [name] [name] Age: 86 y.o. Sex: female Date of Birth: Jun 29, 1931 MRN: [id number]. Admitting Physician: Rise Patience, MD PCP:Kim, Jeneen Rinks, MD  Admit Date: 06/13/2020 Discharge date: 06/15/2020  Recommendations for Outpatient Follow-up:  1. Follow up with PCP in 1-2 weeks 2. Please obtain CMP/CBC in one week 3. Lexapro on hold-as QTC prolonged-please reassess at next visit. 4. Follow blood cultures until final  Admitted From:  Home  Disposition: Home with home health services   [place]:  Yes  Equipment/Devices: None  Discharge Condition: Stable  CODE STATUS: DNR  Diet recommendation:  Diet Order            Diet - low sodium heart healthy           Diet Heart Room service appropriate? Yes; Fluid consistency: Thin; Fluid restriction: 1200 mL Fluid  Diet effective now                  Brief Narrative: Patient is a 86 y.o. female with PMHx of chronic diastolic heart failure, chronic atrial fibrillation, sick sinus syndrome-s/p PPM implantation, nonobstructive CAD,  presented to the hospital with a 3-day history of fever, weakness and poor oral intake.  Her daughter was recently diagnosed with COVID-19.  She was found to have COVID-19 infection, severe hypokalemia along with A. fib with RVR and admitted to the hospitalist service.  See below for further details.   COVID-19 vaccinated status: Vaccinated  Significant Events: 11/30>> Admit to [facility] for A. fib with RVR, hyperkalemia-COVID-19 positive.  Significant studies: 11/30>> chest x-ray: Small left pleural effusion/mid left lung atelectatic changes.  COVID-19 medications: 12/1>> monoclonal antibody infusion x1  Antibiotics: None  Microbiology data: 11/30 >>blood culture: No growth  Procedures: None  Consults: None  Brief Hospital Course: Breakthrough COVID-19 infection: Fully vaccinated-claims second dose of vaccine was in February 2021.    No respiratory  symptoms-chest x-ray without pneumonia-remains stable on room air.  She was given monoclonal antibody infusion x1 on 12/1.  This morning she feels much better-she claims she has more energy-no nausea or vomiting-she is stable to be discharged home for close monitoring.  Patient aware that if she starts developing shortness of breath-she needs to seek immediate medical attention (also spoke with spouse and daughter (Amy) who is a Therapist, sports over the phone -explained above)  COVID-19 Labs:  Recent Labs    06/13/20 2149 06/14/20 0748  DDIMER 0.80* 0.62*  FERRITIN 145  --   LDH 166  --   CRP 12.4* 13.5*    Lab Results  Component Value Date   SARSCOV2NAA NEGATIVE 03/24/2020   [place] NEGATIVE 03/16/2020   [place] NEGATIVE 03/13/2020   [place] NEGATIVE 02/21/2020    Chronic atrial fibrillation with RVR: RVR was secondary to patient not been able to take oral medications due to nausea/poor oral intake-rate now better controlled with rate better controlled-has been resumed on Cardizem-remains on Eliquis.    Chronic diastolic heart failure: Clinically euvolemic-resume diuretics.  History of sick sinus syndrome-s/p permanent pacemaker implantation  HTN: BP reasonably well-controlled-continue Cardizem, labetalol  Hypokalemia/hypomagnesemia: Secondary to nausea/vomiting-along with poor oral intake-repleted.  Prolonged QTC: Secondary to electrolyte abnormalities-continues to have mild QTC prolongation-hold Lexapro-follow with PCP.  Recheck electrolytes in 1 week.  Hypothyroidism: Continue with Synthroid  Depression: Stable-resume Lexapro once QTC normalizes.  Defer to PCP  Deconditioning/debility:  Secondary to acute infection with COVID-Home health ordered.  Discharge Diagnoses:  Principal Problem:   Atrial fibrillation with RVR (HCC) Active Problems:  Essential hypertension   Chronic diastolic heart failure ([place])   Hypothyroidism   COVID   Discharge  Instructions:    Person Under Monitoring Name: NADELYN ENRIQUES  Location: [place] [address]   Infection Prevention Recommendations for Individuals Confirmed to have, or Being Evaluated for, 2019 Novel Coronavirus (COVID-19) Infection Who Receive Care at Home  Individuals who are confirmed to have, or are being evaluated for, COVID-19 should follow the prevention steps below until a healthcare provider or local or state health department says they can return to normal activities.  Stay home except to get medical care You should restrict activities outside your home, except for getting medical care. Do not go to work, school, or public areas, and do not use public transportation or taxis.  Call ahead before visiting your doctor Before your medical appointment, call the healthcare provider and tell them that you have, or are being evaluated for, COVID-19 infection. This will help the healthcare provider's office take steps to keep other people from getting infected. Ask your healthcare provider to call the local or state health department.  Monitor your symptoms Seek prompt medical attention if your illness is worsening (e.g., difficulty breathing). Before going to your medical appointment, call the healthcare provider and tell them that you have, or are being evaluated for, COVID-19 infection. Ask your healthcare provider to call the local or state health department.  Wear a facemask You should wear a facemask that covers your nose and mouth when you are in the same room with other people and when you visit a healthcare provider. People who live with or visit you should also wear a facemask while they are in the same room with you.  Separate yourself from other people in your home As much as possible, you should stay in a different room from other people in your home. Also, you should use a separate bathroom, if available.  Avoid sharing household  items You should not share dishes, drinking glasses, cups, eating utensils, towels, bedding, or other items with other people in your home. After using these items, you should wash them thoroughly with soap and water.  Cover your coughs and sneezes Cover your mouth and nose with a tissue when you cough or sneeze, or you can cough or sneeze into your sleeve. Throw used tissues in a lined trash can, and immediately wash your hands with soap and water for at least 20 seconds or use an alcohol-based hand rub.  Wash your [facility] your hands often and thoroughly with soap and water for at least 20 seconds. You can use an alcohol-based hand sanitizer if soap and water are not available and if your hands are not visibly dirty. Avoid touching your eyes, nose, and mouth with unwashed hands.   Prevention Steps for Caregivers and Household Members of Individuals Confirmed to have, or Being Evaluated for, COVID-19 Infection Being Cared for in the Home  If you live with, or provide care at home for, a person confirmed to have, or being evaluated for, COVID-19 infection please follow these guidelines to prevent infection:  Follow healthcare provider's instructions Make sure that you understand and can help the patient follow any healthcare provider instructions for all care.  Provide for the patient's basic needs You should help the patient with basic needs in the home and provide support for getting groceries, prescriptions, and other personal needs.  Monitor the patient's symptoms If they are getting sicker, call his or her medical  provider and tell them that the patient has, or is being evaluated for, COVID-19 infection. This will help the healthcare provider's office take steps to keep other people from getting infected. Ask the healthcare provider to call the local or state health department.  Limit the number of people who have contact with the patient  If possible, have only one  caregiver for the patient.  Other household members should stay in another home or place of residence. If this is not possible, they should stay  in another room, or be separated from the patient as much as possible. Use a separate bathroom, if available.  Restrict visitors who do not have an essential need to be in the home.  Keep older adults, very young children, and other sick people away from the patient Keep older adults, very young children, and those who have compromised immune systems or chronic health conditions away from the patient. This includes people with chronic heart, lung, or kidney conditions, diabetes, and cancer.  Ensure good ventilation Make sure that shared spaces in the home have good air flow, such as from an air conditioner or an opened window, weather permitting.  Wash your hands often  Wash your hands often and thoroughly with soap and water for at least 20 seconds. You can use an alcohol based hand sanitizer if soap and water are not available and if your hands are not visibly dirty.  Avoid touching your eyes, nose, and mouth with unwashed hands.  Use disposable paper towels to dry your hands. If not available, use dedicated cloth towels and replace them when they become wet.  Wear a facemask and gloves  Wear a disposable facemask at all times in the room and gloves when you touch or have contact with the patient's blood, body fluids, and/or secretions or excretions, such as sweat, saliva, sputum, nasal mucus, vomit, urine, or feces.  Ensure the mask fits over your nose and mouth tightly, and do not touch it during use.  Throw out disposable facemasks and gloves after using them. Do not reuse.  Wash your hands immediately after removing your facemask and gloves.  If your personal clothing becomes contaminated, carefully remove clothing and launder. Wash your hands after handling contaminated clothing.  Place all used disposable facemasks, gloves, and  other waste in a lined container before disposing them with other household waste.  Remove gloves and wash your hands immediately after handling these items.  Do not share dishes, glasses, or other household items with the patient  Avoid sharing household items. You should not share dishes, drinking glasses, cups, eating utensils, towels, bedding, or other items with a patient who is confirmed to have, or being evaluated for, COVID-19 infection.  After the person uses these items, you should wash them thoroughly with soap and water.  Wash laundry thoroughly  Immediately remove and wash clothes or bedding that have blood, body fluids, and/or secretions or excretions, such as sweat, saliva, sputum, nasal mucus, vomit, urine, or feces, on them.  Wear gloves when handling laundry from the patient.  Read and follow directions on labels of laundry or clothing items and detergent. In general, wash and dry with the warmest temperatures recommended on the label.  Clean all areas the individual has used often  Clean all touchable surfaces, such as counters, tabletops, doorknobs, bathroom fixtures, toilets, phones, keyboards, tablets, and bedside tables, every day. Also, clean any surfaces that may have blood, body fluids, and/or secretions or excretions on them.  Wear gloves  when cleaning surfaces the patient has come in contact with.  Use a diluted bleach solution (e.g., dilute bleach with 1 part bleach and 10 parts water) or a household disinfectant with a label that says EPA-registered for coronaviruses. To make a bleach solution at home, add 1 tablespoon of bleach to 1 quart (4 cups) of water. For a larger supply, add  cup of bleach to 1 gallon (16 cups) of water.  Read labels of cleaning products and follow recommendations provided on product labels. Labels contain instructions for safe and effective use of the cleaning product including precautions you should take when applying the product,  such as wearing gloves or eye protection and making sure you have good ventilation during use of the product.  Remove gloves and wash hands immediately after cleaning.  Monitor yourself for signs and symptoms of illness Caregivers and household members are considered close contacts, should monitor their health, and will be asked to limit movement outside of the home to the extent possible. Follow the monitoring steps for close contacts listed on the symptom monitoring form.   ? If you have additional questions, contact your local health department or call the epidemiologist on call at [phone] (available 24/7). ? This guidance is subject to change. For the most up-to-date guidance from CDC, please refer to their website: YouBlogs.pl    Activity:  As tolerated with Full fall precautions use walker/cane & assistance as needed  Discharge Instructions    (HEART FAILURE PATIENTS) Call MD:  Anytime you have any of the following symptoms: 1) 3 pound weight gain in 24 hours or 5 pounds in 1 week 2) shortness of breath, with or without a dry hacking cough 3) swelling in the hands, feet or stomach 4) if you have to sleep on extra pillows at night in order to breathe.   Complete by: As directed    Call MD for:  difficulty breathing, headache or visual disturbances   Complete by: As directed    Diet - low sodium heart healthy   Complete by: As directed    Discharge instructions   Complete by: As directed    Follow with Primary MD  [name], MD in 1-2 weeks  Please get a complete blood count and chemistry panel checked by your Primary MD at your next visit, and again as instructed by your Primary MD.  Get Medicines reviewed and adjusted: Please take all your medications with you for your next visit with your Primary MD  Laboratory/radiological data: Please request your Primary MD to go over all hospital tests and  procedure/radiological results at the follow up, please ask your Primary MD to get all Hospital records sent to his/her office.  In some cases, they will be blood work, cultures and biopsy results pending at the time of your discharge. Please request that your primary care M.D. follows up on these results.  Also Note the following: If you experience worsening of your admission symptoms, develop shortness of breath, life threatening emergency, suicidal or homicidal thoughts you must seek medical attention immediately by calling 911 or calling your MD immediately  if symptoms less severe.  You must read complete instructions/literature along with all the possible adverse reactions/side effects for all the Medicines you take and that have been prescribed to you. Take any new Medicines after you have completely understood and accpet all the possible adverse reactions/side effects.   Do not drive when taking Pain medications or sleeping medications (Benzodaizepines)  Do not take more  than prescribed Pain, Sleep and Anxiety Medications. It is not advisable to combine anxiety,sleep and pain medications without talking with your primary care practitioner  Special Instructions: If you have smoked or chewed Tobacco  in the last 2 yrs please stop smoking, stop any regular Alcohol  and or any Recreational drug use.  Wear Seat belts while driving.  Please note: You were cared for by a hospitalist during your hospital stay. Once you are discharged, your primary care physician will handle any further medical issues. Please note that NO REFILLS for any discharge medications will be authorized once you are discharged, as it is imperative that you return to your primary care physician (or establish a relationship with a primary care physician if you do not have one) for your post hospital discharge needs so that they can reassess your need for medications and monitor your lab values.   1.)  If you develop shortness  of breath-please seek immediate medical attention.  2.)  Lexapro remains on hold at the time of discharge-please ask your primary care practitioner to repeat twelve-lead EKG, check electrolytes at next visit before resuming.   Increase activity slowly   Complete by: As directed      Allergies as of 06/15/2020      Reactions   Penicillins Anaphylaxis   Has patient had a PCN reaction causing immediate rash, facial/tongue/throat swelling, SOB or lightheadedness with hypotension: Yes Has patient had a PCN reaction causing severe rash involving mucus membranes or skin necrosis: No Has patient had a PCN reaction that required hospitalization No Has patient had a PCN reaction occurring within the last 10 years: No If all of the above answers are "NO", then may proceed with Cephalosporin use.   Statins Other (See Comments)   Elevated LFT's   Tylenol [acetaminophen] Other (See Comments)   If taken with Vytorin at risk for liver damage   Adhesive [tape] Other (See Comments)   Irritates skin, redness   Amiodarone Other (See Comments)   Unknown reaction   Quinolones Other (See Comments)   Unknown reaction   Codeine Other (See Comments)   feel funny, head is fuzzy      Medication List    STOP taking these medications   escitalopram 10 MG tablet Commonly known as: LEXAPRO   polyvinyl alcohol 1.4 % ophthalmic solution Commonly known as: LIQUIFILM TEARS     TAKE these medications   acetaminophen 500 MG tablet Commonly known as: TYLENOL Take 1,000 mg by mouth every 6 (six) hours as needed for mild pain.   apixaban 5 MG Tabs tablet Commonly known as: Eliquis Take 1 tablet (5 mg total) by mouth 2 (two) times daily.   brompheniramine-pseudoephedrine-DM 30-2-10 MG/5ML syrup Take 5 mLs by mouth daily as needed (cough).   diltiazem 120 MG 24 hr capsule Commonly known as: CARDIZEM CD Take 1 capsule (120 mg total) by mouth 2 (two) times daily.   feeding supplement Liqd Take 237 mLs by  mouth 2 (two) times daily between meals. What changed: when to take this   furosemide 40 MG tablet Commonly known as: LASIX Take 1 tablet (40 mg total) by mouth daily. What changed: when to take this   hydrOXYzine 10 MG tablet Commonly known as: ATARAX/VISTARIL Take 1 tablet (10 mg total) by mouth 3 (three) times daily as needed for anxiety. What changed: when to take this   labetalol 300 MG tablet Commonly known as: NORMODYNE TAKE 1 TABLET BY MOUTH IN THE MORNING AND 1 &  1/2 TABLET IN THE EVENING. What changed:   how much to take  how to take this  when to take this  additional instructions   levothyroxine 25 MCG tablet Commonly known as: SYNTHROID Take 25 mcg by mouth daily before breakfast.   potassium chloride 10 MEQ tablet Commonly known as: KLOR-CON Take 2 tablets (20 mEq total) by mouth daily. What changed: how much to take   psyllium 95 % Pack Commonly known as: HYDROCIL/METAMUCIL Take 1 packet by mouth daily. What changed:   when to take this  reasons to take this   traMADol 50 MG tablet Commonly known as: ULTRAM Take 50 mg by mouth every 6 (six) hours as needed for moderate pain.       Follow-up Information    [name], MD. Schedule an appointment as soon as possible for a visit in 1 week(s).   Specialty: Internal Medicine Contact information: [place] [address] [phone]        [name], MD Follow up in 1 month(s).   Specialty: Cardiology Contact information: [address] [place] [address] [phone]              Allergies  Allergen Reactions  . Penicillins Anaphylaxis    Has patient had a PCN reaction causing immediate rash, facial/tongue/throat swelling, SOB or lightheadedness with hypotension: Yes Has patient had a PCN reaction causing severe rash involving mucus membranes or skin necrosis: No Has patient had a PCN reaction that required hospitalization  No Has patient had a PCN reaction occurring within the last 10 years: No If all of the above answers are "NO", then may proceed with Cephalosporin use.   . Statins Other (See Comments)    Elevated LFT's  . Tylenol [Acetaminophen] Other (See Comments)    If taken with Vytorin at risk for liver damage  . Adhesive [Tape] Other (See Comments)    Irritates skin, redness  . Amiodarone Other (See Comments)    Unknown reaction  . Quinolones Other (See Comments)    Unknown reaction  . Codeine Other (See Comments)    feel funny, head is fuzzy     Other Procedures/Studies: DG Chest Port 1 View  Result Date: 06/13/2020 CLINICAL DATA:  Shortness of breath and weakness for 1 week, COVID-19 positivity, initial encounter EXAM: PORTABLE CHEST 1 VIEW COMPARISON:  01/13/2019 FINDINGS: Cardiac shadow is enlarged. Pacing device is again seen and stable. Aortic calcifications are noted. Right lung is clear. Small left pleural effusion is noted. Mild left midlung atelectatic changes are noted. No bony abnormality is noted. IMPRESSION: Small left pleural effusion and left mid lung atelectatic changes. Electronically Signed   By: Inez Catalina M.D.   On: 06/13/2020 21:35     TODAY-DAY OF DISCHARGE:  Subjective:   Layton Tappan today has no headache,no chest abdominal pain,no new weakness tingling or numbness, feels much better wants to go home today.   Objective:   Blood pressure 127/71, pulse 98, temperature 97.9 F (36.6 C), temperature source Oral, resp. rate (!) 32, height 5' 5"  (1.651 m), weight 61.8 kg, SpO2 93 %.  Intake/Output Summary (Last 24 hours) at 06/15/2020 0830 Last data filed at 06/15/2020 0651 Gross per 24 hour  Intake 110 ml  Output 0 ml  Net 110 ml   Filed Weights   06/13/20 2047 06/14/20 2301  Weight: 65.8 kg 61.8 kg    Exam: Awake Alert, Oriented *3, No new F.N deficits, Normal  affect [place].AT,PERRAL Supple Neck,No JVD, No cervical lymphadenopathy appriciated.   Symmetrical Chest wall movement, Good air movement bilaterally, CTAB RRR,No Gallops,Rubs or new Murmurs, No Parasternal Heave +ve B.Sounds, Abd Soft, Non tender, No organomegaly appriciated, No rebound -guarding or rigidity. No Cyanosis, Clubbing or edema, No new Rash or bruise   PERTINENT RADIOLOGIC STUDIES: DG Chest Port 1 View  Result Date: 06/13/2020 CLINICAL DATA:  Shortness of breath and weakness for 1 week, COVID-19 positivity, initial encounter EXAM: PORTABLE CHEST 1 VIEW COMPARISON:  01/13/2019 FINDINGS: Cardiac shadow is enlarged. Pacing device is again seen and stable. Aortic calcifications are noted. Right lung is clear. Small left pleural effusion is noted. Mild left midlung atelectatic changes are noted. No bony abnormality is noted. IMPRESSION: Small left pleural effusion and left mid lung atelectatic changes. Electronically Signed   By: Inez Catalina M.D.   On: 06/13/2020 21:35     PERTINENT LAB RESULTS: CBC: Recent Labs    06/14/20 0748 06/15/20 0557  WBC 2.5* 7.1  HGB 12.0 11.7*  HCT 37.3 34.2*  PLT 188 200   CMET CMP     Component Value Date/Time   NA 137 06/15/2020 0557   NA 142 04/19/2020 1423   K 3.7 06/15/2020 0557   CL 100 06/15/2020 0557   CO2 23 06/15/2020 0557   GLUCOSE 114 (H) 06/15/2020 0557   BUN 12 06/15/2020 0557   BUN 17 04/19/2020 1423   CREATININE 0.81 06/15/2020 0557   CREATININE 1.23 (H) 06/20/2016 1130   CALCIUM 9.0 06/15/2020 0557   PROT 4.9 (L) 06/14/2020 0748   ALBUMIN 2.4 (L) 06/14/2020 0748   AST 28 06/14/2020 0748   ALT 14 06/14/2020 0748   ALKPHOS 55 06/14/2020 0748   BILITOT 0.9 06/14/2020 0748   GFRNONAA >60 06/15/2020 0557   GFRAA 86 04/19/2020 1423    GFR Estimated Creatinine Clearance: 44 mL/min (by C-G formula based on SCr of 0.81 mg/dL). No results for input(s): LIPASE, AMYLASE in the last 72 hours. No results for input(s): CKTOTAL, CKMB, CKMBINDEX, TROPONINI in the last 72 hours. Invalid input(s):  [place]    06/13/20 2149 06/14/20 0748  DDIMER 0.80* 0.62*   No results for input(s): HGBA1C in the last 72 hours. Recent Labs    06/13/20 2149  TRIG 73   Recent Labs    06/14/20 0254  TSH 1.219   Recent Labs    06/13/20 2149  FERRITIN 145   Coags: No results for input(s): INR in the last 72 hours.  Invalid input(s): PT Microbiology: Recent Results (from the past 240 hour(s))  Blood Culture (routine x 2)     Status: None (Preliminary result)   Collection Time: 06/13/20  9:49 PM   Specimen: BLOOD LEFT HAND  Result Value Ref Range Status   Specimen Description BLOOD LEFT HAND  Final   Special Requests   Final    BOTTLES DRAWN AEROBIC AND ANAEROBIC Blood Culture adequate volume   Culture   Final    NO GROWTH < 12 HOURS Performed at [facility], [place] [address] [place], [place] [address]    Report Status PENDING  Incomplete  Blood Culture (routine x 2)     Status: None (Preliminary result)   Collection Time: 06/13/20 11:26 PM   Specimen: BLOOD RIGHT HAND  Result Value Ref Range Status   Specimen Description BLOOD RIGHT HAND  Final   Special Requests   Final    BOTTLES DRAWN AEROBIC AND ANAEROBIC Blood Culture  adequate volume   Culture   Final    NO GROWTH < 12 HOURS Performed at [place] [address] [address], [place], [place] [address]    Report Status PENDING  Incomplete  MRSA PCR Screening     Status: None   Collection Time: 06/14/20 11:04 PM   Specimen: Nasopharyngeal  Result Value Ref Range Status   MRSA by PCR NEGATIVE NEGATIVE Final    Comment:        The GeneXpert MRSA Assay (FDA approved for NASAL specimens only), is one component of a comprehensive MRSA colonization surveillance program. It is not intended to diagnose MRSA infection nor to guide or monitor treatment for MRSA infections. Performed at [facility], [place] [address], [place], [place] [address]     FURTHER DISCHARGE INSTRUCTIONS:  Get Medicines  reviewed and adjusted: Please take all your medications with you for your next visit with your Primary MD  Laboratory/radiological data: Please request your Primary MD to go over all hospital tests and procedure/radiological results at the follow up, please ask your Primary MD to get all Hospital records sent to his/her office.  In some cases, they will be blood work, cultures and biopsy results pending at the time of your discharge. Please request that your primary care M.D. goes through all the records of your hospital data and follows up on these results.  Also Note the following: If you experience worsening of your admission symptoms, develop shortness of breath, life threatening emergency, suicidal or homicidal thoughts you must seek medical attention immediately by calling 911 or calling your MD immediately  if symptoms less severe.  You must read complete instructions/literature along with all the possible adverse reactions/side effects for all the Medicines you take and that have been prescribed to you. Take any new Medicines after you have completely understood and accpet all the possible adverse reactions/side effects.   Do not drive when taking Pain medications or sleeping medications (Benzodaizepines)  Do not take more than prescribed Pain, Sleep and Anxiety Medications. It is not advisable to combine anxiety,sleep and pain medications without talking with your primary care practitioner  Special Instructions: If you have smoked or chewed Tobacco  in the last 2 yrs please stop smoking, stop any regular Alcohol  and or any Recreational drug use.  Wear Seat belts while driving.  Please note: You were cared for by a hospitalist during your hospital stay. Once you are discharged, your primary care physician will handle any further medical issues. Please note that NO REFILLS for any discharge medications will be authorized once you are discharged, as it is imperative that you return to  your primary care physician (or establish a relationship with a primary care physician if you do not have one) for your post hospital discharge needs so that they can reassess your need for medications and monitor your lab values.  Total Time spent coordinating discharge including counseling, education and face to face time equals 35 minutes.  Signed: Caillou Minus 06/15/2020 8:30 AM

## 2020-06-15 NOTE — Care Management Obs Status (Signed)
MEDICARE OBSERVATION STATUS NOTIFICATION   Patient Details  Name: [name] [name] MRN: [id number] Date of Birth: April 22, 1931   Medicare Observation Status Notification Given:       Zenon Mayo, RN 06/15/2020, 10:27 AM

## 2020-06-15 NOTE — TOC Transition Note (Signed)
Transition of Care [facility]) - CM/SW Discharge Note   Patient Details  Name: [name] [name] MRN: [id number] Date of Birth: [birth year]  Transition of Care [facility]) CM/SW Contact:  Zenon Mayo, RN Phone Number: 06/15/2020, 10:03 AM   Clinical Narrative:    NCM spoke with patient, offered to set up [place] for her she states she does not want HHPT or [place] right now.  NCM informed her that this was recommended for her, she states "no not right now".  She states she has a bsc at home, her spouse is on the way to take her home.  She has no issues with getting her medications or transportation.   Final next level of care: Home/Self Care Barriers to Discharge: No Barriers Identified   Patient Goals and CMS Choice Patient states their goals for this hospitalization and ongoing recovery are:: get better      Discharge Placement                       Discharge Plan and Services   Discharge Planning Services: CM Consult              DME Agency: NA       HH Arranged: Refused HH, PT, OT          Social Determinants of Health (SDOH) Interventions     Readmission Risk Interventions No flowsheet data found.

## 2020-06-15 NOTE — Discharge Instructions (Signed)
Person Under Monitoring Name: [name] [name]  Location: [place] [address]   Infection Prevention Recommendations for Individuals Confirmed to have, or Being Evaluated for, 2019 Novel Coronavirus (COVID-19) Infection Who Receive Care at Home  Individuals who are confirmed to have, or are being evaluated for, COVID-19 should follow the prevention steps below until a healthcare provider or local or state health department says they can return to normal activities.  Stay home except to get medical care You should restrict activities outside your home, except for getting medical care. Do not go to work, school, or public areas, and do not use public transportation or taxis.  Call ahead before visiting your doctor Before your medical appointment, call the healthcare provider and tell them that you have, or are being evaluated for, COVID-19 infection. This will help the healthcare provider's office take steps to keep other people from getting infected. Ask your healthcare provider to call the local or state health department.  Monitor your symptoms Seek prompt medical attention if your illness is worsening (e.g., difficulty breathing). Before going to your medical appointment, call the healthcare provider and tell them that you have, or are being evaluated for, COVID-19 infection. Ask your healthcare provider to call the local or state health department.  Wear a facemask You should wear a facemask that covers your nose and mouth when you are in the same room with other people and when you visit a healthcare provider. People who live with or visit you should also wear a facemask while they are in the same room with you.  Separate yourself from other people in your home As much as possible, you should stay in a different room from other people in your home. Also, you should use a separate bathroom, if available.  Avoid sharing household items You should  not share dishes, drinking glasses, cups, eating utensils, towels, bedding, or other items with other people in your home. After using these items, you should wash them thoroughly with soap and water.  Cover your coughs and sneezes Cover your mouth and nose with a tissue when you cough or sneeze, or you can cough or sneeze into your sleeve. Throw used tissues in a lined trash can, and immediately wash your hands with soap and water for at least 20 seconds or use an alcohol-based hand rub.  Wash your [facility] your hands often and thoroughly with soap and water for at least 20 seconds. You can use an alcohol-based hand sanitizer if soap and water are not available and if your hands are not visibly dirty. Avoid touching your eyes, nose, and mouth with unwashed hands.   Prevention Steps for Caregivers and Household Members of Individuals Confirmed to have, or Being Evaluated for, COVID-19 Infection Being Cared for in the Home  If you live with, or provide care at home for, a person confirmed to have, or being evaluated for, COVID-19 infection please follow these guidelines to prevent infection:  Follow healthcare provider's instructions Make sure that you understand and can help the patient follow any healthcare provider instructions for all care.  Provide for the patient's basic needs You should help the patient with basic needs in the home and provide support for getting groceries, prescriptions, and other personal needs.  Monitor the patient's symptoms If they are getting sicker, call his or her medical provider and tell them that the patient has, or is being evaluated for, COVID-19 infection. This will help the healthcare provider's  office take steps to keep other people from getting infected. Ask the healthcare provider to call the local or state health department.  Limit the number of people who have contact with the patient  If possible, have only one caregiver for the  patient.  Other household members should stay in another home or place of residence. If this is not possible, they should stay  in another room, or be separated from the patient as much as possible. Use a separate bathroom, if available.  Restrict visitors who do not have an essential need to be in the home.  Keep older adults, very young children, and other sick people away from the patient Keep older adults, very young children, and those who have compromised immune systems or chronic health conditions away from the patient. This includes people with chronic heart, lung, or kidney conditions, diabetes, and cancer.  Ensure good ventilation Make sure that shared spaces in the home have good air flow, such as from an air conditioner or an opened window, weather permitting.  Wash your hands often  Wash your hands often and thoroughly with soap and water for at least 20 seconds. You can use an alcohol based hand sanitizer if soap and water are not available and if your hands are not visibly dirty.  Avoid touching your eyes, nose, and mouth with unwashed hands.  Use disposable paper towels to dry your hands. If not available, use dedicated cloth towels and replace them when they become wet.  Wear a facemask and gloves  Wear a disposable facemask at all times in the room and gloves when you touch or have contact with the patient's blood, body fluids, and/or secretions or excretions, such as sweat, saliva, sputum, nasal mucus, vomit, urine, or feces.  Ensure the mask fits over your nose and mouth tightly, and do not touch it during use.  Throw out disposable facemasks and gloves after using them. Do not reuse.  Wash your hands immediately after removing your facemask and gloves.  If your personal clothing becomes contaminated, carefully remove clothing and launder. Wash your hands after handling contaminated clothing.  Place all used disposable facemasks, gloves, and other waste in a lined  container before disposing them with other household waste.  Remove gloves and wash your hands immediately after handling these items.  Do not share dishes, glasses, or other household items with the patient  Avoid sharing household items. You should not share dishes, drinking glasses, cups, eating utensils, towels, bedding, or other items with a patient who is confirmed to have, or being evaluated for, COVID-19 infection.  After the person uses these items, you should wash them thoroughly with soap and water.  Wash laundry thoroughly  Immediately remove and wash clothes or bedding that have blood, body fluids, and/or secretions or excretions, such as sweat, saliva, sputum, nasal mucus, vomit, urine, or feces, on them.  Wear gloves when handling laundry from the patient.  Read and follow directions on labels of laundry or clothing items and detergent. In general, wash and dry with the warmest temperatures recommended on the label.  Clean all areas the individual has used often  Clean all touchable surfaces, such as counters, tabletops, doorknobs, bathroom fixtures, toilets, phones, keyboards, tablets, and bedside tables, every day. Also, clean any surfaces that may have blood, body fluids, and/or secretions or excretions on them.  Wear gloves when cleaning surfaces the patient has come in contact with.  Use a diluted bleach solution (e.g., dilute bleach with 1  part bleach and 10 parts water) or a household disinfectant with a label that says EPA-registered for coronaviruses. To make a bleach solution at home, add 1 tablespoon of bleach to 1 quart (4 cups) of water. For a larger supply, add  cup of bleach to 1 gallon (16 cups) of water.  Read labels of cleaning products and follow recommendations provided on product labels. Labels contain instructions for safe and effective use of the cleaning product including precautions you should take when applying the product, such as wearing gloves or  eye protection and making sure you have good ventilation during use of the product.  Remove gloves and wash hands immediately after cleaning.  Monitor yourself for signs and symptoms of illness Caregivers and household members are considered close contacts, should monitor their health, and will be asked to limit movement outside of the home to the extent possible. Follow the monitoring steps for close contacts listed on the symptom monitoring form.   ? If you have additional questions, contact your local health department or call the epidemiologist on call at [phone] (available 24/7). ? This guidance is subject to change. For the most up-to-date guidance from [facility], please refer to their website: [email or web address].pl

## 2020-06-15 NOTE — TOC Initial Note (Signed)
Transition of Care [facility]) - Initial/Assessment Note    Patient Details  Name: [name] [name] MRN: [id number] Date of Birth: 10/07/1930  Transition of Care [facility]) CM/SW Contact:    Zenon Mayo, RN Phone Number: 06/15/2020, 10:01 AM  Clinical Narrative:                 NCM spoke with patient, offered to set up [place] for her she states she does not want HHPT or [place] right now.  NCM informed her that this was recommended for her, she states "no not right now".  She states she has a bsc at home, her spouse is on the way to take her home.  She has no issues with getting her medications or transportation.   Expected Discharge Plan: [place] Barriers to Discharge: No Barriers Identified   Patient Goals and CMS Choice Patient states their goals for this hospitalization and ongoing recovery are:: get better      Expected Discharge Plan and Services Expected Discharge Plan: [place]   Discharge Planning Services: CM Consult   Living arrangements for the past 2 months: Single Family Home Expected Discharge Date: 06/15/20                 DME Agency: NA       HH Arranged: Refused HH, PT, OT          Prior Living Arrangements/Services Living arrangements for the past 2 months: Single Family Home Lives with:: Spouse Patient language and need for interpreter reviewed:: Yes Do you feel safe going back to the place where you live?: Yes      Need for Family Participation in Patient Care: Yes (Comment) Care giver support system in place?: Yes (comment) Current home services: DME (has bsc) Criminal Activity/Legal Involvement Pertinent to Current Situation/Hospitalization: No - Comment as needed  Activities of Daily Living Home Assistive Devices/Equipment: Other (Comment) ("tall walking stick") ADL Screening (condition at time of admission) Patient's cognitive ability adequate to safely complete daily activities?: Yes Is the patient  deaf or have difficulty hearing?: No Does the patient have difficulty seeing, even when wearing glasses/contacts?: No Does the patient have difficulty concentrating, remembering, or making decisions?: No Patient able to express need for assistance with ADLs?: Yes Does the patient have difficulty dressing or bathing?: No Independently performs ADLs?: Yes (appropriate for developmental age) Does the patient have difficulty walking or climbing stairs?: Yes Weakness of Legs: Both Weakness of Arms/Hands: None  Permission Sought/Granted                  Emotional Assessment   Attitude/Demeanor/Rapport: Engaged Affect (typically observed): Appropriate Orientation: : Oriented to Self, Oriented to Place, Oriented to  Time, Oriented to Situation Alcohol / Substance Use: Not Applicable Psych Involvement: No (comment)  Admission diagnosis:  Hypokalemia [E87.6] Prolonged QT interval [R94.31] Atrial fibrillation with RVR ([place]) [I48.91] COVID [U07.1] Patient Active Problem List   Diagnosis Date Noted  . Atrial fibrillation with RVR ([place]) 06/14/2020  . COVID 06/13/2020  . Hemorrhoids 03/24/2020  . AKI (acute kidney injury) ([place]) 03/16/2020  . Hypokalemia 03/16/2020  . Lower GI bleed   . Acute blood loss anemia   . Acute lower GI bleeding 02/21/2020  . Symptomatic anemia 02/21/2020  . Migraine equivalent 01/06/2017  . Calculus of kidney   . Chronic diastolic CHF (congestive heart failure) ([place])   . AAA (abdominal aortic aneurysm) without rupture ([place])   . PMR (polymyalgia  rheumatica) ([place])   . Hypothyroidism   . Calculus of ureter   . CAD in native artery   . Other specified hypothyroidism   . Elevated LFTs 04/22/2016  . Hydronephrosis 04/22/2016  . Renal lithiasis 04/22/2016  . UTI (urinary tract infection) 04/22/2016  . Acute kidney injury ([place]) 04/22/2016  . Atrial flutter ([place])   . Nontraumatic cortical hemorrhage of left cerebral hemisphere ([place]) 05/03/2015  . Chronic  anticoagulation 05/03/2015  . Cardiac pacemaker in situ 05/03/2015  . Encounter for therapeutic drug monitoring 10/04/2013  . Dyspnea on exertion 07/26/2013  . Chest pain 02/04/2013  . Orthostatic lightheadedness 06/25/2012  . Pacemaker-Medtronic 11/12/2011  . Ascending aorta enlargement ([place]) 08/12/2011  . Chronic diastolic heart failure ([place]) 12/27/2010  . Long term (current) use of anticoagulants 10/15/2010  . PREMATURE VENTRICULAR CONTRACTIONS 10/17/2009  . post termination pauses/.sinus bradycardia 11/17/2008  . Essential hypertension 04/28/2008  . Atrial fibrillation ([place]) 04/28/2008   PCP:  [name], MD Pharmacy:   [facility],  - [address] [place] [address] Phone: [phone] Fax: [phone]     Social Determinants of Health (SDOH) Interventions    Readmission Risk Interventions No flowsheet data found.

## 2020-06-16 ENCOUNTER — Other Ambulatory Visit (HOSPITAL_COMMUNITY): Payer: Self-pay

## 2020-06-16 ENCOUNTER — Other Ambulatory Visit: Payer: Self-pay

## 2020-06-18 LAB — CULTURE, BLOOD (ROUTINE X 2)
Culture: NO GROWTH
Culture: NO GROWTH
Special Requests: ADEQUATE
Special Requests: ADEQUATE

## 2020-06-27 DIAGNOSIS — R634 Abnormal weight loss: Secondary | ICD-10-CM | POA: Diagnosis not present

## 2020-06-27 DIAGNOSIS — S22068A Other fracture of T7-T8 thoracic vertebra, initial encounter for closed fracture: Secondary | ICD-10-CM | POA: Diagnosis not present

## 2020-06-27 DIAGNOSIS — M40204 Unspecified kyphosis, thoracic region: Secondary | ICD-10-CM | POA: Diagnosis not present

## 2020-06-27 DIAGNOSIS — R630 Anorexia: Secondary | ICD-10-CM | POA: Diagnosis not present

## 2020-06-27 DIAGNOSIS — I700 Atherosclerosis of aorta: Secondary | ICD-10-CM | POA: Diagnosis not present

## 2020-06-27 DIAGNOSIS — L989 Disorder of the skin and subcutaneous tissue, unspecified: Secondary | ICD-10-CM | POA: Diagnosis not present

## 2020-06-27 DIAGNOSIS — Z8616 Personal history of COVID-19: Secondary | ICD-10-CM | POA: Diagnosis not present

## 2020-06-27 DIAGNOSIS — M546 Pain in thoracic spine: Secondary | ICD-10-CM | POA: Diagnosis not present

## 2020-06-27 DIAGNOSIS — S22078A Other fracture of T9-T10 vertebra, initial encounter for closed fracture: Secondary | ICD-10-CM | POA: Diagnosis not present

## 2020-07-03 DIAGNOSIS — M546 Pain in thoracic spine: Secondary | ICD-10-CM | POA: Diagnosis not present

## 2020-07-03 DIAGNOSIS — M4854XA Collapsed vertebra, not elsewhere classified, thoracic region, initial encounter for fracture: Secondary | ICD-10-CM | POA: Diagnosis not present

## 2020-07-04 ENCOUNTER — Other Ambulatory Visit: Payer: Self-pay | Admitting: Orthopedic Surgery

## 2020-07-04 DIAGNOSIS — M546 Pain in thoracic spine: Secondary | ICD-10-CM

## 2020-07-05 ENCOUNTER — Telehealth: Payer: Self-pay | Admitting: *Deleted

## 2020-07-05 NOTE — Telephone Encounter (Signed)
   [facility] Pre-operative Risk Assessment    HEARTCARE STAFF: - Please ensure there is not already an duplicate clearance open for this procedure. - Under Visit Info/Reason for Call, type in Other and utilize the format Clearance MM/DD/YY or Clearance TBD. Do not use dashes or single digits. - If request is for dental extraction, please clarify the # of teeth to be extracted.  Request for surgical clearance:  1. What type of surgery is being performed? KYPHOPLASTY   2. When is this surgery scheduled? TBD   3. What type of clearance is required (medical clearance vs. Pharmacy clearance to hold med vs. Both)? BOTH  4. Are there any medications that need to be held prior to surgery and how long? ELIQUIS   5. Practice name and name of physician performing surgery? [facility]; AMANDA WARD, PAC   6. What is the office phone number? [phone]   7.   What is the office fax number? [name]  8.   Anesthesia type (None, local, MAC, general) ? IV SEDATION, LOCAL   [name] [name] 07/05/2020, 4:07 PM  _________________________________________________________________   (provider comments below)

## 2020-07-05 NOTE — Telephone Encounter (Signed)
Patient with diagnosis of afib on Eliquis for anticoagulation.    Procedure: kyphoplasty Date of procedure: TBD  CHA2DS2-VASc Score = 8  This indicates a 10.8% annual risk of stroke. The patient's score is based upon: CHF History: Yes HTN History: Yes Diabetes History: No Stroke History: Yes Vascular Disease History: Yes Age Score: 2 Gender Score: 1  CrCl 77m/min Platelet count 200K  Typically hold DOACs for 3 days prior to spinal procedures, however given elevated CV risk including history of stroke, will defer to MD for input.

## 2020-07-10 NOTE — Telephone Encounter (Signed)
Dr. Caryl Comes,  [name] [name] is an 86 yo female with PMH of permanent atrial fibrillation, SSS s/p PPM, HTN, diastolic heart failure. She was last seen 05/03/20 by Tommye Standard, PA doing well with no changes made and recommended to follow up in 6 months. Since that time she was admitted 06/13/20-06/15/20 with COVID 19 and atrial fibrillation with RVR in setting of not being able to tolerate oral medications. She was treated with monoclonal antibodies, home medications resumed, and rate controlled. Of note, her Lexapro was held due to QTc prolongation in setting of hypokalemia. She is on Eliquis for her atrial fibrillation with CHADS2VASc of at least 8 (CHF, HTN, ICHx2, agex2, gender). Please comment on holding Eliquis 3 days prior to kyphoplasty given elevated CV risk including intracerebral hemorrhage in 2016.   Please route your response to 'P CV DIV PREOP'.  Loel Dubonnet, NP

## 2020-07-11 NOTE — Telephone Encounter (Signed)
If symptoms necessitate a procedure, then should hold Apixoban for 3 days as noted

## 2020-07-12 ENCOUNTER — Ambulatory Visit
Admission: RE | Admit: 2020-07-12 | Discharge: 2020-07-12 | Disposition: A | Discharge status: Home / Self Care | Payer: Medicare PPO | Source: Ambulatory Visit | Attending: Orthopedic Surgery | Admitting: Orthopedic Surgery

## 2020-07-12 DIAGNOSIS — S22060A Wedge compression fracture of T7-T8 vertebra, initial encounter for closed fracture: Secondary | ICD-10-CM | POA: Diagnosis not present

## 2020-07-12 DIAGNOSIS — J90 Pleural effusion, not elsewhere classified: Secondary | ICD-10-CM | POA: Diagnosis not present

## 2020-07-12 DIAGNOSIS — M546 Pain in thoracic spine: Secondary | ICD-10-CM

## 2020-07-12 DIAGNOSIS — I712 Thoracic aortic aneurysm, without rupture: Secondary | ICD-10-CM | POA: Diagnosis not present

## 2020-07-12 DIAGNOSIS — J9811 Atelectasis: Secondary | ICD-10-CM | POA: Diagnosis not present

## 2020-07-12 NOTE — Telephone Encounter (Signed)
Left VM requesting call back 07/12/20 at 1:15pm.   Loel Dubonnet, NP

## 2020-07-17 NOTE — Telephone Encounter (Signed)
Patient returning phone call, was disconnected when connecting with Charlaine Dalton is reaching out to patient.

## 2020-07-17 NOTE — Telephone Encounter (Signed)
   Primary Cardiologist: Virl Axe, MD  Chart reviewed as part of pre-operative protocol coverage. Patient was contacted 07/17/2020 in reference to pre-operative risk assessment for pending surgery as outlined below.  [name] [name] was last seen on 05/03/20 by Tommye Standard, PA-C.  Since that day, ASHELY GOOSBY has done fine from a cardiac standpoint. She has been quite limited in activity by back pain over the past month. Prior to this she was able to complete 4 METs without anginal complaints. She's had a very reassuring work-up in the past including an echocardiogram 08/2019 with EF 55-60% and no RWMA, as well as a heart catheterization in 2019 which showed mild RCA disease.   Therefore, based on ACC/AHA guidelines, the patient would be at acceptable risk for the planned procedure without further cardiovascular testing.   The patient was advised that if she develops new symptoms prior to surgery to contact our office to arrange for a follow-up visit, and she verbalized understanding.  Per pharmacy and Dr. Olin Pia recommendations, patient can hold eliquis 3 days prior to her upcoming procedure with plans to restart when cleared to do so by her surgeon.  I will route this recommendation to the requesting party via Epic fax function and remove from pre-op pool. Please call with questions.  Abigail Butts, PA-C 07/17/2020, 9:01 AM

## 2020-07-27 DIAGNOSIS — M546 Pain in thoracic spine: Secondary | ICD-10-CM | POA: Diagnosis not present

## 2020-07-27 DIAGNOSIS — M4854XD Collapsed vertebra, not elsewhere classified, thoracic region, subsequent encounter for fracture with routine healing: Secondary | ICD-10-CM | POA: Diagnosis not present

## 2020-07-28 ENCOUNTER — Ambulatory Visit (INDEPENDENT_AMBULATORY_CARE_PROVIDER_SITE_OTHER): Payer: Medicare PPO

## 2020-07-28 ENCOUNTER — Telehealth: Payer: Self-pay | Admitting: Internal Medicine

## 2020-07-28 DIAGNOSIS — I5032 Chronic diastolic (congestive) heart failure: Secondary | ICD-10-CM

## 2020-07-28 NOTE — Telephone Encounter (Signed)
  1. Has your device fired? no  2. Is you device beeping? no  3. Are you experiencing draining or swelling at device site? no  4. Are you calling to see if we received your device transmission? yes  5. Have you passed out? no  Patient states she has been trying to send a transmission, but it is not working.  Please route to [place]

## 2020-07-28 NOTE — Telephone Encounter (Signed)
Assisted patient with sending transmission.

## 2020-07-31 LAB — CUP PACEART REMOTE DEVICE CHECK
Battery Impedance: 1243 Ohm
Battery Remaining Longevity: 63 mo
Battery Voltage: 2.76 V
Brady Statistic RV Percent Paced: 26 %
Date Time Interrogation Session: 20220114133737
Implantable Lead Implant Date: 20130114
Implantable Lead Implant Date: 20130114
Implantable Lead Location: 753859
Implantable Lead Location: 753860
Implantable Lead Model: 1948
Implantable Lead Model: 5076
Implantable Pulse Generator Implant Date: 20130114
Lead Channel Impedance Value: 621 Ohm
Lead Channel Impedance Value: 67 Ohm
Lead Channel Pacing Threshold Amplitude: 0.75 V
Lead Channel Pacing Threshold Pulse Width: 0.4 ms
Lead Channel Setting Pacing Amplitude: 2.5 V
Lead Channel Setting Pacing Pulse Width: 0.4 ms
Lead Channel Setting Sensing Sensitivity: 5.6 mV

## 2020-08-01 ENCOUNTER — Ambulatory Visit: Payer: Self-pay | Admitting: Orthopedic Surgery

## 2020-08-08 ENCOUNTER — Ambulatory Visit: Payer: Self-pay | Admitting: Orthopedic Surgery

## 2020-08-08 NOTE — H&P (Signed)
Subjective:   [name] [name] is a pleasant 87 year old female With past medical history significant for stroke, on Eliquis was in her normal state of health until September she began having significant thoracic back pain. Imaging studies reveal T7 & T8 Compression fractures. Her pain is severe and altering her quality-of-life despite appropriate attempts at conservative care including over-the-counter medication and prescription medication. After discussing risks and benefits the patient has elected to move forward with surgical intervention. She is scheduled for T7 and T8 kyphoplasty At [facility] On 08/16/20 With Dr. Rolena Infante.  Patient Active Problem List   Diagnosis Date Noted  . Atrial fibrillation with RVR ([place]) 06/14/2020  . COVID 06/13/2020  . Hemorrhoids 03/24/2020  . AKI (acute kidney injury) ([place]) 03/16/2020  . Hypokalemia 03/16/2020  . Lower GI bleed   . Acute blood loss anemia   . Acute lower GI bleeding 02/21/2020  . Symptomatic anemia 02/21/2020  . Migraine equivalent 01/06/2017  . Calculus of kidney   . Chronic diastolic CHF (congestive heart failure) ([place])   . AAA (abdominal aortic aneurysm) without rupture ([place])   . PMR (polymyalgia rheumatica) ([facility])   . Hypothyroidism   . Calculus of ureter   . CAD in native artery   . Other specified hypothyroidism   . Elevated LFTs 04/22/2016  . Hydronephrosis 04/22/2016  . Renal lithiasis 04/22/2016  . UTI (urinary tract infection) 04/22/2016  . Acute kidney injury ([place]) 04/22/2016  . Atrial flutter ([place])   . Nontraumatic cortical hemorrhage of left cerebral hemisphere ([place]) 05/03/2015  . Chronic anticoagulation 05/03/2015  . Cardiac pacemaker in situ 05/03/2015  . Encounter for therapeutic drug monitoring 10/04/2013  . Dyspnea on exertion 07/26/2013  . Chest pain 02/04/2013  . Orthostatic lightheadedness 06/25/2012  . Pacemaker-Medtronic 11/12/2011  . Ascending aorta enlargement ([place]) 08/12/2011  . Chronic diastolic heart failure ([place])  12/27/2010  . Long term (current) use of anticoagulants 10/15/2010  . PREMATURE VENTRICULAR CONTRACTIONS 10/17/2009  . post termination pauses/.sinus bradycardia 11/17/2008  . Essential hypertension 04/28/2008  . Atrial fibrillation ([place]) 04/28/2008   Past Medical History:  Diagnosis Date  . Anemia due to chronic blood loss 12/2019   rectal bleeding  . Bilateral lower extremity edema   . CAD (coronary artery disease) cardiologist-- dr Caryl Comes   a. LHC 10/2017 30% RCA otherwise OK.  (nonobstructive cad)  . Chronic diastolic CHF (congestive heart failure) ([employer])    followed by dr Caryl Comes  . Chronic venous insufficiency   . Hemorrhoids   . History of adenomatous polyp of colon   . History of hemorrhagic cerebrovascular accident (CVA) without residual deficits 03/25/2015   (previous seen by neuologist--- dr Erlinda Hong   ICH left posterior parietal in setting  HTN/ and pt taking coumadin;   per pt no residual  . History of kidney stones   . History of recurrent UTIs   . HLD (hyperlipidemia)   . HTN (hypertension)    followed by dr Caryl Comes and pcp  . Hypothyroidism    followed by pcp  . NASH (nonalcoholic steatohepatitis)    hx medication induced hepatitis (vytorin per pt)  . OA (osteoarthritis)   . Pacemaker-Medtronic 07/29/2011  implanted   followed by dr Caryl Comes  . Permanent atrial fibrillation [facility])    cardiology--- dr Caryl Comes--- hx several DCCVs  . PMR (polymyalgia rheumatica) ([place])    03-14-2020  per pt has not had any issues in several years  . Rectal bleeding   . sinus node dysfunction//post termination pauses   . SSS (sick  sinus syndrome) ([place])    s/p  PPM 07-29-2011  . Thoracic aortic aneurysm (TAA) ([place])    a. followed by Dr. Servando Snare.    Past Surgical History:  Procedure Laterality Date  . BIOPSY  02/23/2020   Procedure: BIOPSY;  Surgeon: Clarene Essex, MD;  Location: [facility] ENDOSCOPY;  Service: Endoscopy;;  . CARDIAC CATHETERIZATION  12-05-2006  dr cooper   nonobstructive cad  . CARDIAC  PACEMAKER PLACEMENT  07-29-2011   dr Valaria Good (dual)  . CARDIOVERSION  03/02/2012   Procedure: CARDIOVERSION;  Surgeon: Lelon Perla, MD;  Location: [facility] ENDOSCOPY;  Service: Cardiovascular;  Laterality: N/A;  . CARDIOVERSION  03/13/2012   Procedure: CARDIOVERSION;  Surgeon: Josue Hector, MD;  Location: [place];  Service: Cardiovascular;  Laterality: N/A;  . CARDIOVERSION  04/15/2012   Procedure: CARDIOVERSION;  Surgeon: [name], MD;  Location: [place];  Service: Cardiovascular;  Laterality: N/A;  . CARDIOVERSION N/A 06/07/2014   Procedure: CARDIOVERSION;  Surgeon: [name], MD;  Location: [facility] ENDOSCOPY;  Service: Cardiovascular;  Laterality: N/A;  . CARDIOVERSION N/A 08/18/2014   Procedure: CARDIOVERSION;  Surgeon: [name], MD;  Location: [place];  Service: Cardiovascular;  Laterality: N/A;  . CARDIOVERSION N/A 09/06/2015   Procedure: CARDIOVERSION;  Surgeon: Jerline Pain, MD;  Location: [place];  Service: Cardiovascular;  Laterality: N/A;  . COLONOSCOPY N/A 02/23/2020   Procedure: COLONOSCOPY possible flex sig only;  Surgeon: Clarene Essex, MD;  [facility]: [facility] ENDOSCOPY;  Service: Endoscopy;  Laterality: N/A;  . CYSTOSCOPY WITH RETROGRADE PYELOGRAM, URETEROSCOPY AND STENT PLACEMENT Right 10/ 2017  @[facility]   . CYSTOSCOPY WITH STENT PLACEMENT Right 04/22/2016   Procedure: CYSTOSCOPY WITH RIGHT URETERAL STENT INSERTION;  Surgeon: [name], MD;  Location: [place];  Service: Urology;  Laterality: Right;  . CYSTOSCOPY WITH URETEROSCOPY, STONE BASKETRY AND STENT PLACEMENT Right 04/30/2016   Procedure: CYSTOSCOPY WITH URETEROSCOPY, STENT PLACEMENT REMOVAL;  Surgeon: [name], MD;  Location: [facility] ORS;  Service: Urology;  Laterality: Right;  . HEMORRHOID SURGERY N/A 03/15/2020   Procedure: 3 COLUMN HEMORRHOIDECTOMY;  Surgeon: Leighton Ruff, MD;  Location: [facility];  Service: General;  Laterality: N/A;  . [place]  . LEFT HEART CATH AND CORONARY ANGIOGRAPHY N/A 10/28/2017   Procedure: LEFT HEART CATH AND CORONARY ANGIOGRAPHY;  Surgeon: Lorretta Harp, MD;  Location: [place] CV LAB;  Service: Cardiovascular;  Laterality: N/A;  . PERMANENT PACEMAKER INSERTION N/A 07/29/2011   Procedure: PERMANENT PACEMAKER INSERTION;  Surgeon: [name], MD;  Location: [facility] CATH LAB;  Service: Cardiovascular;  Laterality: N/A;  . UMBILICAL HERNIA REPAIR  1950's    Current Outpatient Medications  Medication Sig Dispense Refill Last Dose  . acetaminophen (TYLENOL) 500 MG tablet Take 1,000 mg by mouth every 6 (six) hours as needed for mild pain.     Marland Kitchen apixaban (ELIQUIS) 5 MG TABS tablet Take 1 tablet (5 mg total) by mouth 2 (two) times daily. 180 tablet 1   . diltiazem (CARDIZEM CD) 120 MG 24 hr capsule Take 1 capsule (120 mg total) by mouth 2 (two) times daily. 180 capsule 1   . escitalopram (LEXAPRO) 10 MG tablet Take 10 mg by mouth at bedtime.     . feeding supplement, ENSURE ENLIVE, (ENSURE ENLIVE) LIQD Take 237 mLs by mouth 2 (two) times daily between meals. (Patient taking differently: Take 237 mLs by mouth daily as needed (need something extra).) 237 mL 12   .  furosemide (LASIX) 40 MG tablet Take 1 tablet (40 mg total) by mouth daily. (Patient taking differently: Take 40 mg by mouth every other day.) 90 tablet 1   . hydrOXYzine (ATARAX/VISTARIL) 10 MG tablet Take 1 tablet (10 mg total) by mouth 3 (three) times daily as needed for anxiety. (Patient not taking: Reported on 08/07/2020) 30 tablet 0   . labetalol (NORMODYNE) 300 MG tablet TAKE 1 TABLET BY MOUTH IN THE MORNING AND 1 & 1/2 TABLET IN THE EVENING. (Patient taking differently: Take 300 mg by mouth See admin instructions. Take 1 tablet (300 mg totally) by mouth in the morning; take 1.5 tablets (450 mg totally) by mouth in the evening) 225 tablet 3   . levothyroxine (SYNTHROID, LEVOTHROID) 25 MCG tablet Take 25 mcg by mouth daily before breakfast.      .  potassium chloride (KLOR-CON) 10 MEQ tablet Take 2 tablets (20 mEq total) by mouth daily. 90 tablet 1   . psyllium (HYDROCIL/METAMUCIL) 95 % PACK Take 1 packet by mouth daily. (Patient taking differently: Take 1 packet by mouth daily as needed for mild constipation.) 240 each    . traMADol (ULTRAM) 50 MG tablet Take 50 mg by mouth every 6 (six) hours as needed for moderate pain.       No current facility-administered medications for this visit.   Allergies  Allergen Reactions  . Penicillins Anaphylaxis    Has patient had a PCN reaction causing immediate rash, facial/tongue/throat swelling, SOB or lightheadedness with hypotension: Yes Has patient had a PCN reaction causing severe rash involving mucus membranes or skin necrosis: No Has patient had a PCN reaction that required hospitalization No Has patient had a PCN reaction occurring within the last 10 years: No If all of the above answers are "NO", then may proceed with Cephalosporin use.   . Statins Other (See Comments)    Elevated LFT's  . Tylenol [Acetaminophen] Other (See Comments)    If taken with Vytorin at risk for liver damage  . Adhesive [Tape] Other (See Comments)    Irritates skin, redness  . Amiodarone Other (See Comments)    Unknown reaction  . Fosamax [Alendronate] Other (See Comments)  . Quinolones Other (See Comments)    Unknown reaction  . Codeine Other (See Comments)    feel funny, head is fuzzy    Social History   Tobacco Use  . Smoking status: Former Smoker    Years: 20.00    Types: Cigarettes    Quit date: 07/16/1990    Years since quitting: 30.0  . Smokeless tobacco: Never Used  Substance Use Topics  . Alcohol use: No    Family History  Problem Relation Age of Onset  . Coronary artery disease Mother   . Coronary artery disease Other   . Alzheimer's disease Other   . Coronary artery disease Brother   . Coronary artery disease Brother     Review of Systems Pertinent items are noted in  HPI.  Objective:   Vitals: Ht: 5 ft 1 in 08/08/2020 01:28 pm BP: 98/58 R arm 08/08/2020 01:32 pm Pulse: 67 bpm 08/08/2020 01:30 pm  General: AAOX3, well developed and well nourished, NAD Ambulation: normal gait pattern, uses walker assistive device. Inspection: Kyphosis deformity Heart: Regular rate and rhythm, no rubs, murmurs, or gallops Lungs: Clear to auscultation bilaterally Abdomen: Bowel sounds 4, nondistended, nontender, no rebound tenderness. Palpation: Significantly tender to palpation over midline thoracic spine at approximately T7/T8 AROM: - Significant pain with range of motion  of the thoracic spine - Knee: flexion and extension normal and pain free bilaterally. - Ankle: Dorsiflexion, plantarflexion, inversion, eversion normal and pain free. Dermatomes: Lower extremity sensation to light touch intact bilaterally. No radicular leg pain. Patient does not complain of pain radiating into the rib cage or abdomen. Myotomes: - Generalized lower extremity weakness secondary to age and deconditioning. No obvious motor deficits. Reflexes: - Babinski: Left Ngative, Right Negative - Clonus: Negative PV: Extremities warm and well profused. Posterior and dorsalis pedis pulse 2+ bilaterally, No pitting Edema, discoloration, calf tenderness X-Ray impression: Significant kyphosis deformity of the thoracic spine with a wedge compression deformity, time indeterminate, appears to be at T7 or T8 approximately. No other obvious fractures are seen. Patient does have a pacemaker. CT scan of the thoracic spine: completed on 07/12/20 was reviewed with the patient. It was completed at [place]; I have independently reviewed the images as well as the radiology report. endplate compression fractures of T7 and T8. At T7 there is 25% loss of anterior height, and T8 there is 50% loss of height. No significant retropulsion. Small left pleural effusion positive cardiomegaly. Unchanged ascending  thoracic aortic aneurysm. No other significant findings.   Assessment:   Min is a pleasant 87 year old female With past medical history significant for stroke, on Eliquis was in her normal state of health until September she began having significant thoracic back pain. Imaging studies reveal T7 & T8 Compression fractures. Her pain is severe and altering her quality-of-life despite appropriate attempts at conservative care including over-the-counter medication and prescription medication. After discussing risks and benefits the patient has elected to move forward with surgical intervention.  Plan:   T7 and T8 kyphoplasty under local and IV sedation  Risks of surgery include: Infection, bleeding, death, stroke, paralysis, nerve damage, leak of cement, need for additional surgery including open decompression. Ongoing or worse pain. Goals of surgery: Reduction in pain, and improvement in quality of life  We have obtained preoperative medical clearance from the patient's primary care provider and cardiologist. Further cardiology recommendations she may hold Eliquis 3 days prior to surgery and will resume when safe after this will likely be 24 hours after surgery.  I have also reviewed her medication list with her. She is not on any anti-inflammatories. She will hold her vitamin C and vitamin D supplements and continue her potassium.  We have also discussed the post-operative recovery period to include: bathing/showering restrictions, wound healing, activity (and driving) restrictions, medications/pain mangement.  We have also discussed post-operative redflags to include: signs and symptoms of postoperative infection, DVT/PE.  Follow-up: 2 weeks postop

## 2020-08-08 NOTE — H&P (Deleted)
  The note originally documented on this encounter has been moved the the encounter in which it belongs.  

## 2020-08-10 ENCOUNTER — Encounter: Payer: Self-pay | Admitting: Internal Medicine

## 2020-08-10 NOTE — Progress Notes (Signed)
[facility] - [place] [address] - [address] [place] [address] Phone: [phone] Fax: [phone]      Your procedure is scheduled on February 2  Report to [place] Entrance "A" at 1:00 P.M., and check in at the Admitting office.  Call this number if you have problems the morning of surgery:  [phone]  Call [phone] if you have any questions prior to your surgery date Monday-Friday 8am-4pm    Remember:  Do not eat  Or drink after midnight the night before your surgery    Take these medicines the morning of surgery with A SIP OF WATER  acetaminophen (TYLENOL)  If needed diltiazem (CARDIZEM CD) labetalol (NORMODYNE) levothyroxine (SYNTHROID, LEVOTHROID) traMADol (ULTRAM)  If needed for pain  Follow your surgeon's instructions on when to stop apixaban (ELIQUIS) .  If no instructions were given by your surgeon then you will need to call the office to get those instructions.     As of today, STOP taking any Aspirin (unless otherwise instructed by your surgeon) Aleve, Naproxen, Ibuprofen, Motrin, Advil, Goody's, BC's, all herbal medications, fish oil, and all vitamins.                      Do not wear jewelry, make up, or nail polish            Do not wear lotions, powders, perfumes/colognes, or deodorant.            Do not shave 48 hours prior to surgery.             Do not bring valuables to the hospital.            [facility] is not responsible for any belongings or valuables.  Do NOT Smoke (Tobacco/Vaping) or drink Alcohol 24 hours prior to your procedure If you use a CPAP at night, you may bring all equipment for your overnight stay.   Contacts, glasses, dentures or bridgework may not be worn into surgery.      For patients admitted to the hospital, discharge time will be determined by your treatment team.   Patients discharged the day of surgery will not be allowed to drive home, and  someone needs to stay with them for 24 hours.    Special instructions:   [facility]- Preparing For Surgery  Before surgery, you can play an important role. Because skin is not sterile, your skin needs to be as free of germs as possible. You can reduce the number of germs on your skin by washing with CHG (chlorahexidine gluconate) Soap before surgery.  CHG is an antiseptic cleaner which kills germs and bonds with the skin to continue killing germs even after washing.    Oral Hygiene is also important to reduce your risk of infection.  Remember - BRUSH YOUR TEETH THE MORNING OF SURGERY WITH YOUR REGULAR TOOTHPASTE  Please do not use if you have an allergy to CHG or antibacterial soaps. If your skin becomes reddened/irritated stop using the CHG.  Do not shave (including legs and underarms) for at least 48 hours prior to first CHG shower. It is OK to shave your face.  Please follow these instructions carefully.   1. Shower the NIGHT BEFORE SURGERY and the MORNING OF SURGERY with CHG Soap.   2. If you chose to wash your hair, wash your hair first as usual with your normal shampoo.  3. After you  shampoo, rinse your hair and body thoroughly to remove the shampoo.  4. Use CHG as you would any other liquid soap. You can apply CHG directly to the skin and wash gently with a scrungie or a clean washcloth.   5. Apply the CHG Soap to your body ONLY FROM THE NECK DOWN.  Do not use on open wounds or open sores. Avoid contact with your eyes, ears, mouth and genitals (private parts). Wash Face and genitals (private parts)  with your normal soap.   6. Wash thoroughly, paying special attention to the area where your surgery will be performed.  7. Thoroughly rinse your body with warm water from the neck down.  8. DO NOT shower/wash with your normal soap after using and rinsing off the CHG Soap.  9. Pat yourself dry with a CLEAN TOWEL.  10. Wear CLEAN PAJAMAS to bed the night before  surgery  11. Place CLEAN SHEETS on your bed the night of your first shower and DO NOT SLEEP WITH PETS.   Day of Surgery: Wear Clean/Comfortable clothing the morning of surgery Do not apply any deodorants/lotions.   Remember to brush your teeth WITH YOUR REGULAR TOOTHPASTE.   Please read over the following fact sheets that you were given.

## 2020-08-10 NOTE — Progress Notes (Signed)
PCP -  Cardiologist -   PPM/ICD - ICD Device Orders - sent to clinic Rep Notified - [employer] email sent to [facility]  Chest x-ray -  EKG -  Stress Test -  ECHO -  Cardiac Cath -   Sleep Study -  CPAP -   Fasting Blood Sugar -  Checks Blood Sugar _____ times a day  Blood Thinner Instructions: Aspirin Instructions:  ERAS Protcol - PRE-SURGERY Ensure or G2-   COVID TEST-    Anesthesia review:   Patient denies shortness of breath, fever, cough and chest pain at PAT appointment   All instructions explained to the patient, with a verbal understanding of the material. Patient agrees to go over the instructions while at home for a better understanding. Patient also instructed to self quarantine after being tested for COVID-19. The opportunity to ask questions was provided.

## 2020-08-10 NOTE — Progress Notes (Signed)
PERIOPERATIVE PRESCRIPTION FOR IMPLANTED CARDIAC DEVICE PROGRAMMING  Patient Information: Name:  [name] [name]  DOB:  05-28-1931  MRN:  [id number]    Planned Procedure: T7-8 kyphoplasty  Surgeon: Melina Schools  Date of Procedure: 08/16/20  Cautery will be used.  Position during surgery: prone   Please send documentation back to:  [facility] (Fax # [phone])   Sheralyn Boatman, RN  08/10/2020 9:57 AM   Device Information:  Clinic EP Physician:  Virl Axe, MD   Device Type:  Pacemaker Manufacturer and Phone #:  Medtronic: [phone] Pacemaker Dependent?:  No. Date of Last Device Check:  07/28/20 Normal Device Function?:  Yes.    Electrophysiologist's Recommendations:   Have magnet available.  Provide continuous ECG monitoring when magnet is used or reprogramming is to be performed.  Procedure will likely interfere with device function.  Device should be programmed:  Asynchronous pacing during procedure and returned to normal programming after procedure   Please have Medtronic rep present for procedure  Per Device Clinic Standing Orders, York Ram, RN  5:29 PM 08/10/2020

## 2020-08-11 ENCOUNTER — Encounter (HOSPITAL_COMMUNITY): Payer: Self-pay

## 2020-08-11 ENCOUNTER — Encounter (HOSPITAL_COMMUNITY)
Admission: RE | Admit: 2020-08-11 | Discharge: 2020-08-11 | Disposition: A | Discharge status: Home / Self Care | Payer: Medicare PPO | Source: Ambulatory Visit | Attending: Orthopedic Surgery | Admitting: Orthopedic Surgery

## 2020-08-11 ENCOUNTER — Ambulatory Visit (HOSPITAL_COMMUNITY)
Admission: RE | Admit: 2020-08-11 | Discharge: 2020-08-11 | Disposition: A | Discharge status: Home / Self Care | Payer: Medicare PPO | Source: Ambulatory Visit | Attending: Orthopedic Surgery | Admitting: Orthopedic Surgery

## 2020-08-11 ENCOUNTER — Other Ambulatory Visit: Payer: Self-pay

## 2020-08-11 DIAGNOSIS — K7581 Nonalcoholic steatohepatitis (NASH): Secondary | ICD-10-CM | POA: Insufficient documentation

## 2020-08-11 DIAGNOSIS — E039 Hypothyroidism, unspecified: Secondary | ICD-10-CM | POA: Insufficient documentation

## 2020-08-11 DIAGNOSIS — I712 Thoracic aortic aneurysm, without rupture: Secondary | ICD-10-CM | POA: Diagnosis not present

## 2020-08-11 DIAGNOSIS — Z01818 Encounter for other preprocedural examination: Secondary | ICD-10-CM | POA: Diagnosis not present

## 2020-08-11 DIAGNOSIS — I872 Venous insufficiency (chronic) (peripheral): Secondary | ICD-10-CM | POA: Insufficient documentation

## 2020-08-11 DIAGNOSIS — I2510 Atherosclerotic heart disease of native coronary artery without angina pectoris: Secondary | ICD-10-CM | POA: Insufficient documentation

## 2020-08-11 DIAGNOSIS — Z79899 Other long term (current) drug therapy: Secondary | ICD-10-CM | POA: Insufficient documentation

## 2020-08-11 DIAGNOSIS — I5032 Chronic diastolic (congestive) heart failure: Secondary | ICD-10-CM | POA: Diagnosis not present

## 2020-08-11 DIAGNOSIS — M4854XA Collapsed vertebra, not elsewhere classified, thoracic region, initial encounter for fracture: Secondary | ICD-10-CM | POA: Insufficient documentation

## 2020-08-11 DIAGNOSIS — I700 Atherosclerosis of aorta: Secondary | ICD-10-CM | POA: Diagnosis not present

## 2020-08-11 DIAGNOSIS — Z87891 Personal history of nicotine dependence: Secondary | ICD-10-CM | POA: Insufficient documentation

## 2020-08-11 DIAGNOSIS — Z950 Presence of cardiac pacemaker: Secondary | ICD-10-CM | POA: Insufficient documentation

## 2020-08-11 DIAGNOSIS — Z7901 Long term (current) use of anticoagulants: Secondary | ICD-10-CM | POA: Insufficient documentation

## 2020-08-11 DIAGNOSIS — J90 Pleural effusion, not elsewhere classified: Secondary | ICD-10-CM | POA: Insufficient documentation

## 2020-08-11 DIAGNOSIS — I110 Hypertensive heart disease with heart failure: Secondary | ICD-10-CM | POA: Insufficient documentation

## 2020-08-11 DIAGNOSIS — Z8616 Personal history of COVID-19: Secondary | ICD-10-CM | POA: Diagnosis not present

## 2020-08-11 DIAGNOSIS — E785 Hyperlipidemia, unspecified: Secondary | ICD-10-CM | POA: Insufficient documentation

## 2020-08-11 HISTORY — DX: Cardiac arrhythmia, unspecified: I49.9

## 2020-08-11 LAB — URINALYSIS, COMPLETE (UACMP) WITH MICROSCOPIC
Bilirubin Urine: NEGATIVE
Glucose, UA: NEGATIVE mg/dL
Hgb urine dipstick: NEGATIVE
Ketones, ur: NEGATIVE mg/dL
Nitrite: NEGATIVE
Protein, ur: NEGATIVE mg/dL
Specific Gravity, Urine: 1.01 (ref 1.005–1.030)
pH: 5 (ref 5.0–8.0)

## 2020-08-11 LAB — CBC
HCT: 30.6 % — ABNORMAL LOW (ref 36.0–46.0)
Hemoglobin: 10.2 g/dL — ABNORMAL LOW (ref 12.0–15.0)
MCH: 31.4 pg (ref 26.0–34.0)
MCHC: 33.3 g/dL (ref 30.0–36.0)
MCV: 94.2 fL (ref 80.0–100.0)
Platelets: 303 10*3/uL (ref 150–400)
RBC: 3.25 MIL/uL — ABNORMAL LOW (ref 3.87–5.11)
RDW: 13.8 % (ref 11.5–15.5)
WBC: 6.7 10*3/uL (ref 4.0–10.5)
nRBC: 0 % (ref 0.0–0.2)

## 2020-08-11 LAB — BASIC METABOLIC PANEL
Anion gap: 11 (ref 5–15)
BUN: 8 mg/dL (ref 8–23)
CO2: 25 mmol/L (ref 22–32)
Calcium: 9.8 mg/dL (ref 8.9–10.3)
Chloride: 102 mmol/L (ref 98–111)
Creatinine, Ser: 0.95 mg/dL (ref 0.44–1.00)
GFR, Estimated: 58 mL/min — ABNORMAL LOW (ref >60)
Glucose, Bld: 90 mg/dL (ref 70–99)
Potassium: 2.7 mmol/L — CL (ref 3.5–5.1)
Sodium: 138 mmol/L (ref 135–145)

## 2020-08-11 LAB — SURGICAL PCR SCREEN
MRSA, PCR: NEGATIVE
Staphylococcus aureus: NEGATIVE

## 2020-08-11 LAB — APTT: aPTT: 47 seconds — ABNORMAL HIGH (ref 24–36)

## 2020-08-11 LAB — PROTIME-INR
INR: 1.9 — ABNORMAL HIGH (ref 0.8–1.2)
Prothrombin Time: 21.5 seconds — ABNORMAL HIGH (ref 11.4–15.2)

## 2020-08-11 NOTE — Progress Notes (Signed)
Called and left message for Dr. Rolena Infante surgery scheduler about pt refusal of providing urinalysis.

## 2020-08-11 NOTE — Progress Notes (Signed)
Called Dr. Rolena Infante office to notify them of elevated PT-INR and APTT as well as critical value of potassium at 2.7.

## 2020-08-11 NOTE — Progress Notes (Addendum)
PCP - [name] Cardiologist - Adam Phenix  PPM/ICD - ICD (email sent to [facility]) Device Orders - received on 08/10/20 (found in chart review) Rep Notified - yes  Chest x-ray - 08/11/20 EKG - 06/16/20 Stress Test - 02/11/13 ECHO - 09/09/19 Cardiac Cath - 10/28/17  Sleep Study - denies  Last dose of eliquis will be Saturday, January 29th. Hold 3 days prior to surgery.   ERAS Protcol -no   COVID TEST- positive on 06/13/20   Anesthesia review: yes, history of atrial fibrillation and needs review per dr brooks. APTT and PT-INR elevated.   Patient denies shortness of breath, fever, cough and chest pain at PAT appointment   All instructions explained to the patient, with a verbal understanding of the material. Patient agrees to go over the instructions while at home for a better understanding. Patient also instructed to self quarantine after being tested for COVID-19. The opportunity to ask questions was provided.

## 2020-08-11 NOTE — Progress Notes (Addendum)
[facility] - [place] - [address] [place] [address] Phone: [phone] Fax: [phone]      Your procedure is scheduled on February 2  Report to [facility] Main Entrance "A" at 06:30 A.M., and check in at the Admitting office.  Call this number if you have problems the morning of surgery:  [phone]  Call [phone] if you have any questions prior to your surgery date Monday-Friday 8am-4pm    Remember:  Do not eat or drink after midnight the night before your surgery    Take these medicines the morning of surgery with A SIP OF WATER  acetaminophen (TYLENOL)  If needed diltiazem (CARDIZEM CD) labetalol (NORMODYNE) levothyroxine (SYNTHROID, LEVOTHROID) traMADol (ULTRAM)  If needed for pain  Follow your surgeon's instructions on when to stop apixaban (ELIQUIS) . Last dose of eliquis will be on Saturday, January 29th. Do not take for 3 days prior to surgery.     As of today, STOP taking any Aspirin (unless otherwise instructed by your surgeon) Aleve, Naproxen, Ibuprofen, Motrin, Advil, Goody's, BC's, all herbal medications, fish oil, and all vitamins.                      Do not wear jewelry, make up, or nail polish            Do not wear lotions, powders, perfumes/colognes, or deodorant.            Do not shave 48 hours prior to surgery.             Do not bring valuables to the hospital.            [facility] is not responsible for any belongings or valuables.  Do NOT Smoke (Tobacco/Vaping) or drink Alcohol 24 hours prior to your procedure If you use a CPAP at night, you may bring all equipment for your overnight stay.   Contacts, glasses, dentures or bridgework may not be worn into surgery.      For patients admitted to the hospital, discharge time will be determined by your treatment team.   Patients discharged the day of surgery will not be allowed to drive home, and someone needs to stay  with them for 24 hours.    Special instructions:   [facility]- Preparing For Surgery  Before surgery, you can play an important role. Because skin is not sterile, your skin needs to be as free of germs as possible. You can reduce the number of germs on your skin by washing with CHG (chlorahexidine gluconate) Soap before surgery.  CHG is an antiseptic cleaner which kills germs and bonds with the skin to continue killing germs even after washing.    Oral Hygiene is also important to reduce your risk of infection.  Remember - BRUSH YOUR TEETH THE MORNING OF SURGERY WITH YOUR REGULAR TOOTHPASTE  Please do not use if you have an allergy to CHG or antibacterial soaps. If your skin becomes reddened/irritated stop using the CHG.  Do not shave (including legs and underarms) for at least 48 hours prior to first CHG shower. It is OK to shave your face.  Please follow these instructions carefully.   1. Shower the NIGHT BEFORE SURGERY and the MORNING OF SURGERY with CHG Soap.   2. If you chose to wash your hair, wash your hair first as usual with your normal shampoo.  3. After you shampoo, rinse your  hair and body thoroughly to remove the shampoo.  4. Use CHG as you would any other liquid soap. You can apply CHG directly to the skin and wash gently with a scrungie or a clean washcloth.   5. Apply the CHG Soap to your body ONLY FROM THE NECK DOWN.  Do not use on open wounds or open sores. Avoid contact with your eyes, ears, mouth and genitals (private parts). Wash Face and genitals (private parts)  with your normal soap.   6. Wash thoroughly, paying special attention to the area where your surgery will be performed.  7. Thoroughly rinse your body with warm water from the neck down.  8. DO NOT shower/wash with your normal soap after using and rinsing off the CHG Soap.  9. Pat yourself dry with a CLEAN TOWEL.  10. Wear CLEAN PAJAMAS to bed the night before surgery  11. Place CLEAN SHEETS on your  bed the night of your first shower and DO NOT SLEEP WITH PETS.   Day of Surgery: Wear Clean/Comfortable clothing the morning of surgery Do not apply any deodorants/lotions.   Remember to brush your teeth WITH YOUR REGULAR TOOTHPASTE.   Please read over the following fact sheets that you were given.

## 2020-08-12 NOTE — Progress Notes (Signed)
Remote pacemaker transmission.   

## 2020-08-14 ENCOUNTER — Encounter (HOSPITAL_COMMUNITY): Payer: Self-pay | Admitting: Orthopedic Surgery

## 2020-08-14 NOTE — Progress Notes (Signed)
Anesthesia Chart Review:  Case: [id number] Date/Time: 08/24/20 1305   Procedure: KYPHOPLASTY T7 and T8 (N/A ) - 60 mins   Anesthesia type: General   Pre-op diagnosis: T7 and T8 compression fracture   [facility] ROOM 05 / [facility] OR   Surgeons: Melina Schools, MD      DISCUSSION: Patient is an 87 year old female scheduled for the above procedure. Procedure was initially scheduled for 08/16/20 but has been moved to 08/24/20.   History includes former smoker (quit 07/16/90), sinus node dysfunction (s/p dual chamber Medtronic ADDRL1 Adapta PPM 07/29/11), afib (s/p DCCV 12/20/10, 03/13/12, 04/15/12, 06/07/14, 08/18/14, 09/06/15), chronic diastolic CHF, CAD (non-obstructive, 30% RCA 2019), thoracic ascending aortic aneurysm (4.9 cm 01/13/20 CTA; 46 mm 09/09/19 echo), HTN, ICH/hemorrhagic CVA (left posterior parietal in setting of HTN and warfarin, 2016), polymyalgia rheumatica, HLD, hypothyroidism, NASH, BLE edema/chronic venous insufficiency, anemia (due to GI/rectal bleed; s/p hemorrhoidectomy 03/15/20), + COVID-19 06/13/20 (s/p monoclonal antibody 06/14/20).   Preoperative cardiology input outlined by Roby Lofts, PA-C on 07/17/20, "[name] [name] was last seen on 05/03/20 by Tommye Standard, PA-C.  Since that day, [name] [name] has done fine from a cardiac standpoint. She has been quite limited in activity by back pain over the past month. Prior to this she was able to complete 4 METs without anginal complaints. She's had a very reassuring work-up in the past including an echocardiogram 08/2019 with EF 55-60% and no RWMA, as well as a heart catheterization in 2019 which showed mild RCA disease.   Therefore, based on ACC/AHA guidelines, the patient would be at acceptable risk for the planned procedure without further cardiovascular testing...   Per pharmacy and Dr. Olin Pia recommendations, patient can hold eliquis 3 days prior to her upcoming procedure with plans to restart when cleared to do so by her  surgeon."  07/28/20 PPM remote transmission showed normal device function. 69 VHR events; longest 2 minutes 47 seconds w/ rates 150's to 190's bpm; EGMs suggest RVR. History of AF and on apixaban and diltiazem. Overall V rates controlled. Next remote 91 days.  On preoperative CXR, she had an increase in left pleural effusion (small to moderate over the past month) with possible lingular atelectasis versus pneumonia. Preoperative labs showed a critical low K at 2.7. Creatinine was 0.95, H/H 10.2/30.6, down from 11.7/34.2 on 06/15/20. PT 21.5, INR 1.9, PTT 47. Patient refused UA to her PAT visit. PAT RN had already notified surgeon's staff of K+ and coag results.   Since patient's PAT visit, her surgery has been moved back to 08/24/20--possibly due to lab and CXR findings. I have left a voice message for Judeen Hammans at Dr. Rolena Infante' office to clarify that he has reviewed results and how findings will be addressed between now and her new surgery date. If not repeated, she will need updated BMET, PT/PTT prior to surgery.    VS: BP 102/89   Pulse 93   Temp 36.8 C   Resp 17   Ht 5' 3"  (1.6 m)   Wt 57.6 kg   SpO2 98%   BMI 22.50 kg/m     PROVIDERS: [name], MD is PCP  Virl Axe, MD is EP cardiologist Lanelle Bal, MD is CT surgeon   LABS: Preoperative labs noted. See DISCUSSION. (all labs ordered are listed, but only abnormal results are displayed)  Labs Reviewed  CBC - Abnormal; Notable for the following components:      Result Value   RBC 3.25 (*)  Hemoglobin 10.2 (*)    HCT 30.6 (*)    All other components within normal limits  BASIC METABOLIC PANEL - Abnormal; Notable for the following components:   Potassium 2.7 (*)    GFR, Estimated 58 (*)    All other components within normal limits  PROTIME-INR - Abnormal; Notable for the following components:   Prothrombin Time 21.5 (*)    INR 1.9 (*)    All other components within normal limits  APTT - Abnormal; Notable for the  following components:   aPTT 47 (*)    All other components within normal limits  URINALYSIS, COMPLETE (UACMP) WITH MICROSCOPIC - Abnormal; Notable for the following components:   Leukocytes,Ua MODERATE (*)    Bacteria, UA RARE (*)    All other components within normal limits  SURGICAL PCR SCREEN     IMAGES: CXR 08/11/20: FINDINGS: Left-sided pacing device as before. Moderate left-sided pleural effusion with airspace disease at the lingula and left base. Enlarged cardiomediastinal silhouette with aortic atherosclerosis. Probable small right pleural effusion. Redemonstrated compression fractures at T7 and T8 with kyphosis. Degenerative changes elsewhere in the thoracic spine. IMPRESSION: Moderate left and small right pleural effusions with airspace disease at the lingula and left base, atelectasis versus pneumonia. Pleural effusion on the left is increased as compared with scout image from thoracic CT of December 2021.  CT Thoracic Spine 07/12/20: IMPRESSION: - T7 and T8 compression fractures as described above are new since 01/13/2020. The fracture have an appearance most consistent with senile osteoporotic or posttraumatic injuries. - Osteopenia. - Small left pleural effusion. - Cardiomegaly. - Ascending thoracic aortic aneurysm appears unchanged. - Aortic Atherosclerosis (ICD10-I70.0).   EKG: 06/16/20: Sinus rhythm Abnormal R-wave progression, early transition Borderline T abnormalities, diffuse leads Borderline prolonged QT interval LVH from previous tracing resolved Confirmed by Wandra Arthurs [phone]) on 06/17/2020 11:26:09 PM   CV: Echo 09/09/19: IMPRESSIONS  1. Left ventricular ejection fraction, by estimation, is 55 to 60%. The  left ventricle has normal function. The left ventricle has no regional  wall motion abnormalities. Left ventricular diastolic function could not  be evaluated. Left ventricular  diastolic function could not be evaluated.  2. Right  ventricular systolic function is normal. The right ventricular  size is mildly enlarged. There is mildly elevated pulmonary artery  systolic pressure. The estimated right ventricular systolic pressure is  76.5 mmHg.  3. Left atrial size was severely dilated.  4. The mitral valve is grossly normal. Mild mitral valve regurgitation.  No evidence of mitral stenosis.  5. Tricuspid valve regurgitation is mild to moderate.  6. The aortic valve is tricuspid. Aortic valve regurgitation is not  visualized. Mild to moderate aortic valve sclerosis/calcification is  present, without any evidence of aortic stenosis.  7. Aneurysm of the ascending aorta, measuring 46 mm.  8. The inferior vena cava is normal in size with greater than 50%  respiratory variability, suggesting right atrial pressure of 3 mmHg.  - Comparison(s): A prior study was performed on 03/28/2015. Prior images  reviewed side by side. Changes from prior study are noted. EF unchanged.  Aorta up to 46 mm from 44 mm in 2016. Afib now present.    Cardiac cath 10/28/17:  Prox RCA to Mid RCA lesion is 30% stenosed.  The left ventricular systolic function is normal.  LV end diastolic pressure is normal.  The left ventricular ejection fraction is 55-65% by visual estimate.   Past Medical History:  Diagnosis Date  . Anemia due  to chronic blood loss 12/2019   rectal bleeding  . Bilateral lower extremity edema   . CAD (coronary artery disease) cardiologist-- dr Caryl Comes   a. LHC 10/2017 30% RCA otherwise OK.  (nonobstructive cad)  . Chronic diastolic CHF (congestive heart failure) ([place])    followed by dr Caryl Comes  . Chronic venous insufficiency   . COVID-19 06/13/2020  . Dysrhythmia    atrial fibrillation  . Hemorrhoids   . History of adenomatous polyp of colon   . History of hemorrhagic cerebrovascular accident (CVA) without residual deficits 03/25/2015   (previous seen by neuologist--- dr Erlinda Hong   ICH left posterior parietal in setting   HTN/ and pt taking coumadin;   per pt no residual  . History of kidney stones   . History of recurrent UTIs   . HLD (hyperlipidemia)   . HTN (hypertension)    followed by dr Caryl Comes and pcp  . Hypothyroidism    followed by pcp  . NASH (nonalcoholic steatohepatitis)    hx medication induced hepatitis (vytorin per pt)  . OA (osteoarthritis)   . Pacemaker-Medtronic 07/29/2011  implanted   followed by dr Caryl Comes  . Permanent atrial fibrillation [facility])    cardiology--- dr Caryl Comes--- hx several DCCVs  . PMR (polymyalgia rheumatica) ([place])    03-14-2020  per pt has not had any issues in several years  . Rectal bleeding   . sinus node dysfunction//post termination pauses   . SSS (sick sinus syndrome) ([place])    s/p  PPM 07-29-2011  . Thoracic aortic aneurysm (TAA) ([place])    a. followed by Dr. Servando Snare.    Past Surgical History:  Procedure Laterality Date  . BIOPSY  02/23/2020   Procedure: BIOPSY;  Surgeon: Clarene Essex, MD;  Location: [facility] ENDOSCOPY;  Service: Endoscopy;;  . BREAST SURGERY    . CARDIAC CATHETERIZATION  12-05-2006  dr cooper   nonobstructive cad  . CARDIAC PACEMAKER PLACEMENT  07-29-2011   dr Valaria Good (dual)  . CARDIOVERSION  03/02/2012   Procedure: CARDIOVERSION;  Surgeon: Lelon Perla, MD;  Location: [facility] ENDOSCOPY;  Service: Cardiovascular;  Laterality: N/A;  . CARDIOVERSION  03/13/2012   Procedure: CARDIOVERSION;  Surgeon: Josue Hector, MD;  Location: [place];  Service: Cardiovascular;  Laterality: N/A;  . CARDIOVERSION  04/15/2012   Procedure: CARDIOVERSION;  Surgeon: [name], MD;  Location: [place];  Service: Cardiovascular;  Laterality: N/A;  . CARDIOVERSION N/A 06/07/2014   Procedure: CARDIOVERSION;  Surgeon: [name], MD;  Location: [facility] ENDOSCOPY;  Service: Cardiovascular;  Laterality: N/A;  . CARDIOVERSION N/A 08/18/2014   Procedure: CARDIOVERSION;  Surgeon: [name], MD;  Location: [place];  Service: Cardiovascular;   Laterality: N/A;  . CARDIOVERSION N/A 09/06/2015   Procedure: CARDIOVERSION;  Surgeon: Jerline Pain, MD;  Location: [place];  Service: Cardiovascular;  Laterality: N/A;  . COLONOSCOPY N/A 02/23/2020   Procedure: COLONOSCOPY possible flex sig only;  Surgeon: Clarene Essex, MD;  Location: [facility] ENDOSCOPY;  Service: Endoscopy;  Laterality: N/A;  . CYSTOSCOPY WITH RETROGRADE PYELOGRAM, URETEROSCOPY AND STENT PLACEMENT Right 10/ 2017  @[facility]   . CYSTOSCOPY WITH STENT PLACEMENT Right 04/22/2016   Procedure: CYSTOSCOPY WITH RIGHT URETERAL STENT INSERTION;  Surgeon: [name], MD;  Location: [place];  Service: Urology;  Laterality: Right;  . CYSTOSCOPY WITH URETEROSCOPY, STONE BASKETRY AND STENT PLACEMENT Right 04/30/2016   Procedure: CYSTOSCOPY WITH URETEROSCOPY, STENT PLACEMENT REMOVAL;  Surgeon: [name], MD;  Location: [facility] ORS;  Service: Urology;  Laterality: Right;  . HEMORRHOID SURGERY N/A 03/15/2020   Procedure: 3 COLUMN HEMORRHOIDECTOMY;  Surgeon: Leighton Ruff, MD;  Location: [place];  Service: General;  Laterality: N/A;  . HERNIA REPAIR    . INCISION AND DRAINAGE BREAST ABSCESS  1973  . LEFT HEART CATH AND CORONARY ANGIOGRAPHY N/A 10/28/2017   Procedure: LEFT HEART CATH AND CORONARY ANGIOGRAPHY;  Surgeon: Lorretta Harp, MD;  Location: [place] CV LAB;  Service: Cardiovascular;  Laterality: N/A;  . PERMANENT PACEMAKER INSERTION N/A 07/29/2011   Procedure: PERMANENT PACEMAKER INSERTION;  Surgeon: [name], MD;  Location[facility] CATH LAB;  Service: Cardiovascular;  Laterality: N/A;  . UMBILICAL HERNIA REPAIR  1950's    MEDICATIONS: . acetaminophen (TYLENOL) 500 MG tablet  . apixaban (ELIQUIS) 5 MG TABS tablet  . diltiazem (CARDIZEM CD) 120 MG 24 hr capsule  . escitalopram (LEXAPRO) 10 MG tablet  . feeding supplement, ENSURE ENLIVE, (ENSURE ENLIVE) LIQD  . furosemide (LASIX) 40 MG tablet  . hydrOXYzine (ATARAX/VISTARIL) 10 MG tablet  . labetalol (NORMODYNE) 300 MG tablet   . levothyroxine (SYNTHROID, LEVOTHROID) 25 MCG tablet  . potassium chloride (KLOR-CON) 10 MEQ tablet  . psyllium (HYDROCIL/METAMUCIL) 95 % PACK  . traMADol (ULTRAM) 50 MG tablet   No current facility-administered medications for this encounter.    Myra Gianotti, PA-C Surgical Short Stay/Anesthesiology [facility] Phone [phone] [facility] Phone [phone] 08/14/2020 2:17 PM

## 2020-08-22 DIAGNOSIS — E559 Vitamin D deficiency, unspecified: Secondary | ICD-10-CM | POA: Diagnosis not present

## 2020-08-22 NOTE — Anesthesia Preprocedure Evaluation (Addendum)
Anesthesia Evaluation  Patient identified by MRN, date of birth, ID band  Reviewed: Allergy & Precautions, NPO status , Patient's Chart, lab work & pertinent test results  Airway Mallampati: III  TM Distance: >3 FB Neck ROM: Limited    Dental  (+) Partial Lower, Partial Upper   Pulmonary former smoker,  covid 06/13/2020   Pulmonary exam normal breath sounds clear to auscultation       Cardiovascular hypertension, + CAD (non obstructive) and +CHF  Normal cardiovascular exam+ dysrhythmias Atrial Fibrillation + pacemaker (sinus node dysfunction)  Rhythm:Regular Rate:Normal     Neuro/Psych  Headaches, CVA negative psych ROS   GI/Hepatic (+) Hepatitis - (NASH due to vytorin)  Endo/Other  Hypothyroidism   Renal/GU   negative genitourinary   Musculoskeletal  (+) Arthritis  (polymyalgia rheumatica),   Abdominal Normal abdominal exam  (+)   Peds negative pediatric ROS (+)  Hematology  (+) anemia ,   Anesthesia Other Findings   Reproductive/Obstetrics negative OB ROS                           Anesthesia Physical Anesthesia Plan  ASA: IV  Anesthesia Plan: MAC   Post-op Pain Management:    Induction: Intravenous  PONV Risk Score and Plan: 3 and Ondansetron  Airway Management Planned: Natural Airway and Simple Face Mask  Additional Equipment: None  Intra-op Plan:   Post-operative Plan: Extubation in OR  Informed Consent: I have reviewed the patients History and Physical, chart, labs and discussed the procedure including the risks, benefits and alternatives for the proposed anesthesia with the patient or authorized representative who has indicated his/her understanding and acceptance.     Dental advisory given  Plan Discussed with: CRNA and Anesthesiologist  Anesthesia Plan Comments: (See PAT note written by Myra Gianotti, PA-C. )    Anesthesia Quick Evaluation

## 2020-08-22 NOTE — Progress Notes (Signed)
Anesthesia follow-up: See my note signed on 08/14/20. Patient is an 87 year old female who was scheduled for T7 & T8 kyphoplasty on 08/16/20 by Dr. Rolena Infante, but procedure moved to 08/24/20 due to abnormal labs (K 2.7, PT 21.5, INR 1.9, PTT 47, moderate leukocytes on UA) and CXR (small-moderate pleural effusions, lingular airspace disease; she was taking Lasix 40 mg every other day as of 08/07/20). + COVID-19 06/13/20, s/p monoclonal antibodies 06/14/20.  Per update by surgeon's staff, CXR and labs noted and reviewed with primary care which is why surgery delayed until 08/24/20. Hypokalemia and UTI being treated. Records from [name], MD requested, but are pending. If BMET and PT/PTT have not been repeated then will need to be drawn on the day of surgery. (UPDATE 08/23/20 9:13 AM: Primary care records received. It does not appear that labs were repeated there. BMET and PT/PTT lab orders entered for the day of surgery. Her CBC was within 14 days.)  Cardiology input outlined in 08/14/20 APP note. Dr. Caryl Comes had given permission for her to hold Eliquis for 3 days prior to surgery. She had normal Medtronic PPM device function as of 07/28/20.   Myra Gianotti, PA-C Surgical Short Stay/Anesthesiology [facility] Phone [phone] [facility] Phone [phone] 08/22/2020 10:34 AM

## 2020-08-23 ENCOUNTER — Other Ambulatory Visit: Payer: Self-pay

## 2020-08-23 ENCOUNTER — Encounter (HOSPITAL_COMMUNITY): Payer: Self-pay | Admitting: Orthopedic Surgery

## 2020-08-23 ENCOUNTER — Encounter: Payer: Self-pay | Admitting: Internal Medicine

## 2020-08-23 NOTE — Progress Notes (Signed)
PCP Maudie Mercury, MD Cardiologist - Caryl Comes, MD  Device orders requested, Gae Dry emailed, Jovita Kussmaul, RN cc'd.   Chest x-ray - 08/11/20 EKG - 06/16/20 Stress Test - 02/11/13 ECHO - 09/09/19 Cardiac Cath - 10/28/17  Eliquis last dose 08/20/20 per pt and pt husband.  COVID TEST- positive 06/13/20  Anesthesia review: yes  -------------  SDW INSTRUCTIONS:  Your procedure is scheduled on 08/24/20. Please report to [facility] Main Entrance "A" at 10:50 A.M., and check in at the Admitting office. Call this number if you have problems the morning of surgery: [phone]   Remember: Do not eat or drink after midnight the night before your surgery  Medications to take morning of surgery with a sip of water include: acetaminophen (TYLENOL) - prn diltiazem (CARDIZEM CD)  labetalol (NORMODYNE)  levothyroxine (SYNTHROID, LEVOTHROID) traMADol (ULTRAM) - prn  As of today, STOP taking any Aspirin (unless otherwise instructed by your surgeon), Aleve, Naproxen, Ibuprofen, Motrin, Advil, Goody's, BC's, all herbal medications, fish oil, and all vitamins.    The Morning of Surgery Do not wear jewelry, make-up or nail polish. Do not wear lotions, powders, or perfumes, or deodorant Do not shave 48 hours prior to surgery.   Do not bring valuables to the hospital. [facility] is not responsible for any belongings or valuables. If you are a smoker, DO NOT Smoke 24 hours prior to surgery If you wear a CPAP at night please bring your mask the morning of surgery  Remember that you must have someone to transport you home after your surgery, and remain with you for 24 hours if you are discharged the same day. Please bring cases for contacts, glasses, hearing aids, dentures or bridgework because it cannot be worn into surgery.   Patients discharged the day of surgery will not be allowed to drive home.   Please shower the NIGHT BEFORE SURGERY and the MORNING OF SURGERY with DIAL Soap. Wear comfortable clothes  the morning of surgery. Oral Hygiene is also important to reduce your risk of infection.  Remember - BRUSH YOUR TEETH THE MORNING OF SURGERY WITH YOUR REGULAR TOOTHPASTE  Patient denies shortness of breath, fever, cough and chest pain.

## 2020-08-23 NOTE — Progress Notes (Signed)
PERIOPERATIVE PRESCRIPTION FOR IMPLANTED CARDIAC DEVICE PROGRAMMING   Patient Information: Name: [name] [name], [name] [name]  DOB: [birth year]  MBB:[id number]  Planned Procedure: Thoracic seven and eight kyphoplasty  Surgeon: Dr. Melina Schools  Date of Procedure: 08/24/20  Cautery will be used.  Position during surgery:    Please send documentation back to:  [facility] (Fax # [phone])   Kayleen Memos, RN  08/23/2020 2:36 PM        Device Information:   Clinic EP Physician:   Virl Axe, MD Device Type:  Pacemaker Manufacturer and Phone #:  Medtronic: [id number] Pacemaker Dependent?:  No Date of Last Device Check:  07/28/20        Normal Device Function?:  Yes     Electrophysiologist's Recommendations:    Have magnet available.  Provide continuous ECG monitoring when magnet is used or reprogramming is to be performed.   Procedure may interfere with device function.  Magnet should be placed over device during procedure.  Per Device Clinic Standing Orders, Drake Leach  08/23/2020 3:00 PM

## 2020-08-24 ENCOUNTER — Ambulatory Visit (HOSPITAL_COMMUNITY): Payer: Medicare PPO | Admitting: Vascular Surgery

## 2020-08-24 ENCOUNTER — Encounter (HOSPITAL_COMMUNITY)
Admission: RE | Disposition: A | Discharge status: Home / Self Care | Payer: Self-pay | Source: Home / Self Care | Attending: Orthopedic Surgery

## 2020-08-24 ENCOUNTER — Ambulatory Visit (HOSPITAL_COMMUNITY)
Admission: RE | Admit: 2020-08-24 | Discharge: 2020-08-24 | Disposition: A | Discharge status: Home / Self Care | Payer: Medicare PPO | Attending: Orthopedic Surgery | Admitting: Orthopedic Surgery

## 2020-08-24 ENCOUNTER — Encounter (HOSPITAL_COMMUNITY): Payer: Self-pay | Admitting: Orthopedic Surgery

## 2020-08-24 ENCOUNTER — Ambulatory Visit: Payer: Self-pay | Admitting: Orthopedic Surgery

## 2020-08-24 ENCOUNTER — Ambulatory Visit (HOSPITAL_COMMUNITY): Payer: Medicare PPO | Admitting: Physician Assistant

## 2020-08-24 ENCOUNTER — Ambulatory Visit (HOSPITAL_COMMUNITY): Payer: Medicare PPO

## 2020-08-24 DIAGNOSIS — M8008XA Age-related osteoporosis with current pathological fracture, vertebra(e), initial encounter for fracture: Secondary | ICD-10-CM | POA: Insufficient documentation

## 2020-08-24 DIAGNOSIS — Z950 Presence of cardiac pacemaker: Secondary | ICD-10-CM | POA: Insufficient documentation

## 2020-08-24 DIAGNOSIS — Z885 Allergy status to narcotic agent status: Secondary | ICD-10-CM | POA: Diagnosis not present

## 2020-08-24 DIAGNOSIS — Z888 Allergy status to other drugs, medicaments and biological substances status: Secondary | ICD-10-CM | POA: Insufficient documentation

## 2020-08-24 DIAGNOSIS — J90 Pleural effusion, not elsewhere classified: Secondary | ICD-10-CM | POA: Insufficient documentation

## 2020-08-24 DIAGNOSIS — Z886 Allergy status to analgesic agent status: Secondary | ICD-10-CM | POA: Insufficient documentation

## 2020-08-24 DIAGNOSIS — Z880 Allergy status to penicillin: Secondary | ICD-10-CM | POA: Diagnosis not present

## 2020-08-24 DIAGNOSIS — Z87892 Personal history of anaphylaxis: Secondary | ICD-10-CM | POA: Diagnosis not present

## 2020-08-24 DIAGNOSIS — M40299 Other kyphosis, site unspecified: Secondary | ICD-10-CM | POA: Diagnosis not present

## 2020-08-24 DIAGNOSIS — Z87891 Personal history of nicotine dependence: Secondary | ICD-10-CM | POA: Diagnosis not present

## 2020-08-24 DIAGNOSIS — Z79899 Other long term (current) drug therapy: Secondary | ICD-10-CM | POA: Diagnosis not present

## 2020-08-24 DIAGNOSIS — E039 Hypothyroidism, unspecified: Secondary | ICD-10-CM | POA: Diagnosis not present

## 2020-08-24 DIAGNOSIS — Z8673 Personal history of transient ischemic attack (TIA), and cerebral infarction without residual deficits: Secondary | ICD-10-CM | POA: Insufficient documentation

## 2020-08-24 DIAGNOSIS — Z8616 Personal history of COVID-19: Secondary | ICD-10-CM | POA: Insufficient documentation

## 2020-08-24 DIAGNOSIS — Z419 Encounter for procedure for purposes other than remedying health state, unspecified: Secondary | ICD-10-CM

## 2020-08-24 DIAGNOSIS — Z79890 Hormone replacement therapy (postmenopausal): Secondary | ICD-10-CM | POA: Diagnosis not present

## 2020-08-24 DIAGNOSIS — R791 Abnormal coagulation profile: Secondary | ICD-10-CM | POA: Diagnosis not present

## 2020-08-24 DIAGNOSIS — I110 Hypertensive heart disease with heart failure: Secondary | ICD-10-CM | POA: Diagnosis not present

## 2020-08-24 DIAGNOSIS — I509 Heart failure, unspecified: Secondary | ICD-10-CM | POA: Diagnosis not present

## 2020-08-24 DIAGNOSIS — S22060A Wedge compression fracture of T7-T8 vertebra, initial encounter for closed fracture: Secondary | ICD-10-CM | POA: Diagnosis not present

## 2020-08-24 HISTORY — PX: KYPHOPLASTY: SHX5884

## 2020-08-24 LAB — BASIC METABOLIC PANEL
Anion gap: 10 (ref 5–15)
BUN: 7 mg/dL — ABNORMAL LOW (ref 8–23)
CO2: 22 mmol/L (ref 22–32)
Calcium: 10.2 mg/dL (ref 8.9–10.3)
Chloride: 108 mmol/L (ref 98–111)
Creatinine, Ser: 0.93 mg/dL (ref 0.44–1.00)
GFR, Estimated: 59 mL/min — ABNORMAL LOW (ref >60)
Glucose, Bld: 88 mg/dL (ref 70–99)
Potassium: 3.9 mmol/L (ref 3.5–5.1)
Sodium: 140 mmol/L (ref 135–145)

## 2020-08-24 LAB — CBC
HCT: 33.6 % — ABNORMAL LOW (ref 36.0–46.0)
Hemoglobin: 10.6 g/dL — ABNORMAL LOW (ref 12.0–15.0)
MCH: 30 pg (ref 26.0–34.0)
MCHC: 31.5 g/dL (ref 30.0–36.0)
MCV: 95.2 fL (ref 80.0–100.0)
Platelets: 270 10*3/uL (ref 150–400)
RBC: 3.53 MIL/uL — ABNORMAL LOW (ref 3.87–5.11)
RDW: 14.7 % (ref 11.5–15.5)
WBC: 6.4 10*3/uL (ref 4.0–10.5)
nRBC: 0 % (ref 0.0–0.2)

## 2020-08-24 LAB — PROTIME-INR
INR: 1.2 (ref 0.8–1.2)
Prothrombin Time: 14.3 seconds (ref 11.4–15.2)

## 2020-08-24 LAB — APTT: aPTT: 33 seconds (ref 24–36)

## 2020-08-24 SURGERY — KYPHOPLASTY
Anesthesia: General | Site: Thoracic

## 2020-08-24 MED ORDER — TRAMADOL HCL 50 MG PO TABS: 50.0000 mg | ORAL_TABLET | Freq: Four times a day (QID) | ORAL | 0 refills | Status: AC | PRN

## 2020-08-24 MED ORDER — LACTATED RINGERS IV SOLN: INTRAVENOUS | Status: DC

## 2020-08-24 MED ORDER — CHLORHEXIDINE GLUCONATE 0.12 % MT SOLN
15.0000 mL | Freq: Once | OROMUCOSAL | Status: AC
  Filled 2020-08-24: qty 15

## 2020-08-24 MED ORDER — CHLORHEXIDINE GLUCONATE 0.12 % MT SOLN
15.0000 mL | Freq: Once | OROMUCOSAL | Status: AC
  Administered 2020-08-24: 15 mL via OROMUCOSAL
  Filled 2020-08-24: qty 15

## 2020-08-24 MED ORDER — ONDANSETRON HCL 4 MG PO TABS: 4.0000 mg | ORAL_TABLET | Freq: Three times a day (TID) | ORAL | 0 refills | Status: AC | PRN

## 2020-08-24 MED ORDER — BUPIVACAINE-EPINEPHRINE 0.25% -1:200000 IJ SOLN
INTRAMUSCULAR | Status: DC | PRN
  Administered 2020-08-24: 10 mL

## 2020-08-24 MED ORDER — PROPOFOL 1000 MG/100ML IV EMUL
INTRAVENOUS | Status: AC
  Filled 2020-08-24: qty 100

## 2020-08-24 MED ORDER — PROPOFOL 10 MG/ML IV BOLUS
INTRAVENOUS | Status: AC
  Filled 2020-08-24: qty 20

## 2020-08-24 MED ORDER — BUPIVACAINE-EPINEPHRINE (PF) 0.25% -1:200000 IJ SOLN
INTRAMUSCULAR | Status: AC
  Filled 2020-08-24: qty 20

## 2020-08-24 MED ORDER — BUPIVACAINE LIPOSOME 1.3 % IJ SUSP
20.0000 mL | INTRAMUSCULAR | Status: AC
  Administered 2020-08-24: 10 mL
  Filled 2020-08-24: qty 20

## 2020-08-24 MED ORDER — FENTANYL CITRATE (PF) 250 MCG/5ML IJ SOLN
INTRAMUSCULAR | Status: AC
  Filled 2020-08-24: qty 5

## 2020-08-24 MED ORDER — FENTANYL CITRATE (PF) 100 MCG/2ML IJ SOLN
INTRAMUSCULAR | Status: DC | PRN
  Administered 2020-08-24: 25 ug via INTRAVENOUS

## 2020-08-24 MED ORDER — PROPOFOL 500 MG/50ML IV EMUL
INTRAVENOUS | Status: DC | PRN
  Administered 2020-08-24: 75 ug/kg/min via INTRAVENOUS

## 2020-08-24 MED ORDER — VANCOMYCIN HCL IN DEXTROSE 1-5 GM/200ML-% IV SOLN
1000.0000 mg | INTRAVENOUS | Status: AC
  Administered 2020-08-24: 1000 mg via INTRAVENOUS
  Filled 2020-08-24: qty 200

## 2020-08-24 MED ORDER — ONDANSETRON HCL 4 MG/2ML IJ SOLN
INTRAMUSCULAR | Status: DC | PRN
  Administered 2020-08-24: 4 mg via INTRAVENOUS

## 2020-08-24 MED ORDER — ORAL CARE MOUTH RINSE: 15.0000 mL | Freq: Once | OROMUCOSAL | Status: AC

## 2020-08-24 MED ORDER — DEXMEDETOMIDINE (PRECEDEX) IN NS 20 MCG/5ML (4 MCG/ML) IV SYRINGE
PREFILLED_SYRINGE | INTRAVENOUS | Status: DC | PRN
  Administered 2020-08-24 (×3): 4 ug via INTRAVENOUS

## 2020-08-24 MED ORDER — ORAL CARE MOUTH RINSE
15.0000 mL | Freq: Once | OROMUCOSAL | Status: DC
  Administered 2020-08-24: 15 mL via OROMUCOSAL

## 2020-08-24 MED ORDER — IOPAMIDOL (ISOVUE-300) INJECTION 61%
INTRAVENOUS | Status: DC | PRN
  Administered 2020-08-24: 100 mL

## 2020-08-24 MED ORDER — PROPOFOL 10 MG/ML IV BOLUS
INTRAVENOUS | Status: DC | PRN
  Administered 2020-08-24 (×2): 10 mg via INTRAVENOUS

## 2020-08-24 MED ORDER — TRAMADOL HCL 50 MG PO TABS
50.0000 mg | ORAL_TABLET | Freq: Once | ORAL | Status: AC | PRN
  Administered 2020-08-24: 50 mg via ORAL

## 2020-08-24 MED ORDER — ONDANSETRON HCL 4 MG/2ML IJ SOLN: 4.0000 mg | Freq: Once | INTRAMUSCULAR | Status: DC | PRN

## 2020-08-24 MED ORDER — TRAMADOL HCL 50 MG PO TABS
ORAL_TABLET | ORAL | Status: AC
  Filled 2020-08-24: qty 1

## 2020-08-24 MED ORDER — 0.9 % SODIUM CHLORIDE (POUR BTL) OPTIME
TOPICAL | Status: DC | PRN
  Administered 2020-08-24 (×2): 1000 mL

## 2020-08-24 MED ORDER — FENTANYL CITRATE (PF) 100 MCG/2ML IJ SOLN: 25.0000 ug | INTRAMUSCULAR | Status: DC | PRN

## 2020-08-24 SURGICAL SUPPLY — 43 items
BLADE SURG 15 STRL LF DISP TIS (BLADE) ×1 IMPLANT
BLADE SURG 15 STRL SS (BLADE) ×2
BNDG ADH 1X3 SHEER STRL LF (GAUZE/BANDAGES/DRESSINGS) ×4 IMPLANT
CEMENT KYPHON CX01A KIT/MIXER (Cement) ×2 IMPLANT
COVER MAYO STAND STRL (DRAPES) ×2 IMPLANT
COVER SURGICAL LIGHT HANDLE (MISCELLANEOUS) ×2 IMPLANT
COVER WAND RF STERILE (DRAPES) IMPLANT
CURETTE EXPRESS SZ2 7MM (INSTRUMENTS) IMPLANT
CURETTE WEDGE 8.5MM KYPHX (MISCELLANEOUS) IMPLANT
CURRETTE EXPRESS SZ2 7MM (INSTRUMENTS)
DERMABOND ADVANCED (GAUZE/BANDAGES/DRESSINGS) ×1
DERMABOND ADVANCED .7 DNX12 (GAUZE/BANDAGES/DRESSINGS) ×1 IMPLANT
DRAPE C-ARM 42X72 X-RAY (DRAPES) ×4 IMPLANT
DRAPE INCISE IOBAN 66X45 STRL (DRAPES) ×2 IMPLANT
DRAPE LAPAROTOMY T 102X78X121 (DRAPES) ×2 IMPLANT
DRAPE WARM FLUID 44X44 (DRAPES) ×2 IMPLANT
DURAPREP 26ML APPLICATOR (WOUND CARE) ×2 IMPLANT
GLOVE BIO SURGEON STRL SZ 6.5 (GLOVE) ×2 IMPLANT
GLOVE BIOGEL PI IND STRL 8.5 (GLOVE) IMPLANT
GLOVE BIOGEL PI INDICATOR 8.5 (GLOVE)
GLOVE SS BIOGEL STRL SZ 8.5 (GLOVE) ×1 IMPLANT
GLOVE SUPERSENSE BIOGEL SZ 8.5 (GLOVE) ×1
GLOVE SURG UNDER POLY LF SZ6.5 (GLOVE) ×2 IMPLANT
GOWN STRL REUS W/ TWL LRG LVL3 (GOWN DISPOSABLE) ×2 IMPLANT
GOWN STRL REUS W/TWL 2XL LVL3 (GOWN DISPOSABLE) ×2 IMPLANT
GOWN STRL REUS W/TWL LRG LVL3 (GOWN DISPOSABLE) ×4
KIT BASIN OR (CUSTOM PROCEDURE TRAY) ×2 IMPLANT
KIT TURNOVER KIT B (KITS) ×2 IMPLANT
NEEDLE HYPO 22GX1.5 SAFETY (NEEDLE) ×2 IMPLANT
NEEDLE SPNL 22GX3.5 QUINCKE BK (NEEDLE) ×2 IMPLANT
NS IRRIG 1000ML POUR BTL (IV SOLUTION) ×2 IMPLANT
PACK SURGICAL SETUP 50X90 (CUSTOM PROCEDURE TRAY) ×2 IMPLANT
PAD ARMBOARD 7.5X6 YLW CONV (MISCELLANEOUS) ×4 IMPLANT
SPONGE LAP 4X18 RFD (DISPOSABLE) ×2 IMPLANT
SUT MNCRL AB 3-0 PS2 18 (SUTURE) ×2 IMPLANT
SYR 30ML BONE CEMENT XPNDR (MISCELLANEOUS) ×4
SYR CONTROL 10ML LL (SYRINGE) ×2 IMPLANT
SYRINGE 30ML BONE CEMENT XPNDR (MISCELLANEOUS) ×2 IMPLANT
TAMP BONE INFLATABLE 10/3 (BALLOONS) ×8 IMPLANT
TOWEL GREEN STERILE (TOWEL DISPOSABLE) ×2 IMPLANT
TRAY KYPHOPAK 15/3 ONESTEP 1ST (MISCELLANEOUS) ×4 IMPLANT
TRAY KYPHOPAK 20/3 ONESTEP 1ST (MISCELLANEOUS) IMPLANT
WATER STERILE IRR 1000ML POUR (IV SOLUTION) ×2 IMPLANT

## 2020-08-24 NOTE — Op Note (Signed)
Operative report  Preoperative diagnosis: T7 and T8 osteoporotic compression fractures  Postoperative diagnosis: Same  Operative procedure: T7 and T8 kyphoplasty  Complications: None  Condition: Stable  Indications: [name] [name] is a very pleasant 87 year old woman who is having significant increased midthoracic pain.  Imaging studies confirm T7 and T8 compression fractures.  Patient has underlying degenerative kyphosis and the pain has become significantly worse and unbearable.  As result of the significant pain we elected to move forward with a two-level kyphoplasty.  All appropriate risks, benefits, alternatives to surgery were discussed with the patient and consent was obtained.  Operative report  Patient was brought to the operating room and turned prone onto the operating room table.  Once she was comfortable the back was prepped and draped in a standard fashion.  Timeout was taken and all important pertinent data was discussed and reviewed.  IV sedation was provided and then using fluoroscopic guidance identified the T7 and T8 vertebral bodies.  I marked clearly lateral aspect of the pedicle sites on the AP and infiltrated the area with cortisone Marcaine with epinephrine.  I then took a spinal needle and advanced it down to the lateral aspect of the pedicle/facet area and infiltrated this with quarter percent Marcaine with Exparel.  I then placed a Jamshidi needle on the lateral aspect of the pedicle and took an extrapedicular approach to the T8 vertebral body.  I confirmed trajectory and position with the AP and lateral views.  Once I was properly situated I repeated this on the contralateral side.  With the T8 vertebral body cannulated I then cannulated the T7 vertebral body in the same fashion.  Great care was taken to remain in an extrapedicular trajectory so as not to cause any neurological injury.  With all 4 pedicles cannulated I then drilled and then sounded the canal and then placed the 10  cc inflatable bone balloons into the deeper bodies.  I inflated the balloons until I had adequate fill.  I then removed the balloon and then inserted the cement.  Cement was allowed to harden.  Approximately 3 cc of cement was placed into both T7 and T8 vertebral bodies.  There was no leak of cement noted.  Once the cement was allowed to harden the Jamshidi needle was removed and the skin was cleaned.  Simple sutures were placed to close the stab incisions and then Dermabond and Band-Aids.  The patient was then transferred to PACU without incident.  The end of the case all needle and sponge counts were correct.  There were no adverse intraoperative events.

## 2020-08-24 NOTE — H&P (Signed)
Addendum H&P  Patient continues to have thoracic pain due to a two-level compression fracture.  Plan on moving forward with a T7 and T8 kyphoplasty.  Preoperative blood work was satisfactory with improvement in her potassium level and PT PTT/INR.  Plan on moving forward with a two-level kyphoplasty.  I reviewed the procedure as well as the risks and benefits and the patient has expressed understanding as well as willingness to move forward with surgery.  There is been no change in her clinical exam since her preoperative visit on 08/08/2020 except for her improvement in blood work.

## 2020-08-24 NOTE — Anesthesia Procedure Notes (Signed)
Procedure Name: MAC Date/Time: 08/24/2020 1:18 PM Performed by: Janene Harvey, CRNA Pre-anesthesia Checklist: Patient identified, Emergency Drugs available, Suction available and Patient being monitored Patient Re-evaluated:Patient Re-evaluated prior to induction Oxygen Delivery Method: Nasal cannula Induction Type: IV induction Placement Confirmation: positive ETCO2 Dental Injury: Teeth and Oropharynx as per pre-operative assessment

## 2020-08-24 NOTE — Progress Notes (Signed)
Spoke with patient's daughter, Konrad Penta, who stated that patient had labs drawn on 08/22/20 but she was not able to see the results at this time.

## 2020-08-24 NOTE — Transfer of Care (Signed)
Immediate Anesthesia Transfer of Care Note  Patient: [name] [name]  Procedure(s) Performed: KYPHOPLASTY THORACIC SEVEN AND EIGHT (N/A Thoracic)  Patient Location: PACU  Anesthesia Type:MAC  Level of Consciousness: drowsy and patient cooperative  Airway & Oxygen Therapy: Patient Spontanous Breathing and Patient connected to nasal cannula oxygen  Post-op Assessment: Report given to RN and Post -op Vital signs reviewed and stable  Post vital signs: Reviewed  Last Vitals:  Vitals Value Taken Time  BP 103/91 08/24/20 1430  Temp    Pulse 66 08/24/20 1432  Resp 28 08/24/20 1432  SpO2 100 % 08/24/20 1432  Vitals shown include unvalidated device data.  Last Pain:  Vitals:   08/24/20 1104  TempSrc:   PainSc: 0-No pain      Patients Stated Pain Goal: 3 (79/15/05 6979)  Complications: No complications documented.

## 2020-08-24 NOTE — Progress Notes (Signed)
[name] [name] did well walking with 2 person standby assist. Her 2nd attempt was @ 50 feet, she did tire but maintained her strength/endurance. Her first attempt was @20  feet with varied complaints. Again, not able to void ( ok  Per Dr Rolena Infante.)

## 2020-08-25 ENCOUNTER — Encounter (HOSPITAL_COMMUNITY): Payer: Self-pay | Admitting: Orthopedic Surgery

## 2020-08-25 NOTE — Anesthesia Postprocedure Evaluation (Signed)
Anesthesia Post Note  Patient: [name] [name]  Procedure(s) Performed: KYPHOPLASTY THORACIC SEVEN AND EIGHT (N/A Thoracic)     Patient location during evaluation: PACU Anesthesia Type: General Level of consciousness: awake and alert Pain management: pain level controlled Vital Signs Assessment: post-procedure vital signs reviewed and stable Respiratory status: spontaneous breathing, nonlabored ventilation and respiratory function stable Cardiovascular status: blood pressure returned to baseline and stable Postop Assessment: no apparent nausea or vomiting Anesthetic complications: no   No complications documented.  Last Vitals:  Vitals:   08/24/20 1430 08/24/20 1502  BP: (!) 103/91 122/60  Pulse: 66 68  Resp: (!) 41 (!) 23  Temp: (!) 36.3 C   SpO2: 98% 94%    Last Pain:  Vitals:   08/24/20 1430  TempSrc:   PainSc: 0-No pain   Pain Goal: Patients Stated Pain Goal: 3 (08/24/20 1056)                 Merlinda Frederick

## 2020-09-05 DIAGNOSIS — M25561 Pain in right knee: Secondary | ICD-10-CM | POA: Diagnosis not present

## 2020-09-05 DIAGNOSIS — M25562 Pain in left knee: Secondary | ICD-10-CM | POA: Diagnosis not present

## 2020-09-14 DIAGNOSIS — Z8673 Personal history of transient ischemic attack (TIA), and cerebral infarction without residual deficits: Secondary | ICD-10-CM | POA: Diagnosis not present

## 2020-09-14 DIAGNOSIS — S8011XD Contusion of right lower leg, subsequent encounter: Secondary | ICD-10-CM | POA: Diagnosis not present

## 2020-09-14 DIAGNOSIS — M8448XD Pathological fracture, other site, subsequent encounter for fracture with routine healing: Secondary | ICD-10-CM | POA: Diagnosis not present

## 2020-09-14 DIAGNOSIS — E7800 Pure hypercholesterolemia, unspecified: Secondary | ICD-10-CM | POA: Diagnosis not present

## 2020-09-14 DIAGNOSIS — I509 Heart failure, unspecified: Secondary | ICD-10-CM | POA: Diagnosis not present

## 2020-09-14 DIAGNOSIS — S8012XD Contusion of left lower leg, subsequent encounter: Secondary | ICD-10-CM | POA: Diagnosis not present

## 2020-09-14 DIAGNOSIS — M170 Bilateral primary osteoarthritis of knee: Secondary | ICD-10-CM | POA: Diagnosis not present

## 2020-09-14 DIAGNOSIS — Z950 Presence of cardiac pacemaker: Secondary | ICD-10-CM | POA: Diagnosis not present

## 2020-09-14 DIAGNOSIS — Z87891 Personal history of nicotine dependence: Secondary | ICD-10-CM | POA: Diagnosis not present

## 2020-09-20 DIAGNOSIS — Z87891 Personal history of nicotine dependence: Secondary | ICD-10-CM | POA: Diagnosis not present

## 2020-09-20 DIAGNOSIS — E7800 Pure hypercholesterolemia, unspecified: Secondary | ICD-10-CM | POA: Diagnosis not present

## 2020-09-20 DIAGNOSIS — Z950 Presence of cardiac pacemaker: Secondary | ICD-10-CM | POA: Diagnosis not present

## 2020-09-20 DIAGNOSIS — S8012XD Contusion of left lower leg, subsequent encounter: Secondary | ICD-10-CM | POA: Diagnosis not present

## 2020-09-20 DIAGNOSIS — I509 Heart failure, unspecified: Secondary | ICD-10-CM | POA: Diagnosis not present

## 2020-09-20 DIAGNOSIS — Z8673 Personal history of transient ischemic attack (TIA), and cerebral infarction without residual deficits: Secondary | ICD-10-CM | POA: Diagnosis not present

## 2020-09-20 DIAGNOSIS — M8448XD Pathological fracture, other site, subsequent encounter for fracture with routine healing: Secondary | ICD-10-CM | POA: Diagnosis not present

## 2020-09-20 DIAGNOSIS — M170 Bilateral primary osteoarthritis of knee: Secondary | ICD-10-CM | POA: Diagnosis not present

## 2020-09-20 DIAGNOSIS — S8011XD Contusion of right lower leg, subsequent encounter: Secondary | ICD-10-CM | POA: Diagnosis not present

## 2020-09-22 DIAGNOSIS — Z950 Presence of cardiac pacemaker: Secondary | ICD-10-CM | POA: Diagnosis not present

## 2020-09-22 DIAGNOSIS — S8012XD Contusion of left lower leg, subsequent encounter: Secondary | ICD-10-CM | POA: Diagnosis not present

## 2020-09-22 DIAGNOSIS — M170 Bilateral primary osteoarthritis of knee: Secondary | ICD-10-CM | POA: Diagnosis not present

## 2020-09-22 DIAGNOSIS — I509 Heart failure, unspecified: Secondary | ICD-10-CM | POA: Diagnosis not present

## 2020-09-22 DIAGNOSIS — Z87891 Personal history of nicotine dependence: Secondary | ICD-10-CM | POA: Diagnosis not present

## 2020-09-22 DIAGNOSIS — E7800 Pure hypercholesterolemia, unspecified: Secondary | ICD-10-CM | POA: Diagnosis not present

## 2020-09-22 DIAGNOSIS — S8011XD Contusion of right lower leg, subsequent encounter: Secondary | ICD-10-CM | POA: Diagnosis not present

## 2020-09-22 DIAGNOSIS — Z8673 Personal history of transient ischemic attack (TIA), and cerebral infarction without residual deficits: Secondary | ICD-10-CM | POA: Diagnosis not present

## 2020-09-22 DIAGNOSIS — M8448XD Pathological fracture, other site, subsequent encounter for fracture with routine healing: Secondary | ICD-10-CM | POA: Diagnosis not present

## 2020-09-25 DIAGNOSIS — E7800 Pure hypercholesterolemia, unspecified: Secondary | ICD-10-CM | POA: Diagnosis not present

## 2020-09-25 DIAGNOSIS — Z87891 Personal history of nicotine dependence: Secondary | ICD-10-CM | POA: Diagnosis not present

## 2020-09-25 DIAGNOSIS — M8448XD Pathological fracture, other site, subsequent encounter for fracture with routine healing: Secondary | ICD-10-CM | POA: Diagnosis not present

## 2020-09-25 DIAGNOSIS — M170 Bilateral primary osteoarthritis of knee: Secondary | ICD-10-CM | POA: Diagnosis not present

## 2020-09-25 DIAGNOSIS — Z950 Presence of cardiac pacemaker: Secondary | ICD-10-CM | POA: Diagnosis not present

## 2020-09-25 DIAGNOSIS — S8011XD Contusion of right lower leg, subsequent encounter: Secondary | ICD-10-CM | POA: Diagnosis not present

## 2020-09-25 DIAGNOSIS — I509 Heart failure, unspecified: Secondary | ICD-10-CM | POA: Diagnosis not present

## 2020-09-25 DIAGNOSIS — S8012XD Contusion of left lower leg, subsequent encounter: Secondary | ICD-10-CM | POA: Diagnosis not present

## 2020-09-25 DIAGNOSIS — Z8673 Personal history of transient ischemic attack (TIA), and cerebral infarction without residual deficits: Secondary | ICD-10-CM | POA: Diagnosis not present

## 2020-09-27 DIAGNOSIS — Z87891 Personal history of nicotine dependence: Secondary | ICD-10-CM | POA: Diagnosis not present

## 2020-09-27 DIAGNOSIS — E7800 Pure hypercholesterolemia, unspecified: Secondary | ICD-10-CM | POA: Diagnosis not present

## 2020-09-27 DIAGNOSIS — M8448XD Pathological fracture, other site, subsequent encounter for fracture with routine healing: Secondary | ICD-10-CM | POA: Diagnosis not present

## 2020-09-27 DIAGNOSIS — Z950 Presence of cardiac pacemaker: Secondary | ICD-10-CM | POA: Diagnosis not present

## 2020-09-27 DIAGNOSIS — I509 Heart failure, unspecified: Secondary | ICD-10-CM | POA: Diagnosis not present

## 2020-09-27 DIAGNOSIS — S8012XD Contusion of left lower leg, subsequent encounter: Secondary | ICD-10-CM | POA: Diagnosis not present

## 2020-09-27 DIAGNOSIS — S8011XD Contusion of right lower leg, subsequent encounter: Secondary | ICD-10-CM | POA: Diagnosis not present

## 2020-09-27 DIAGNOSIS — M170 Bilateral primary osteoarthritis of knee: Secondary | ICD-10-CM | POA: Diagnosis not present

## 2020-09-27 DIAGNOSIS — Z8673 Personal history of transient ischemic attack (TIA), and cerebral infarction without residual deficits: Secondary | ICD-10-CM | POA: Diagnosis not present

## 2020-10-03 DIAGNOSIS — Z4889 Encounter for other specified surgical aftercare: Secondary | ICD-10-CM | POA: Diagnosis not present

## 2020-10-04 DIAGNOSIS — E7800 Pure hypercholesterolemia, unspecified: Secondary | ICD-10-CM | POA: Diagnosis not present

## 2020-10-04 DIAGNOSIS — S8011XD Contusion of right lower leg, subsequent encounter: Secondary | ICD-10-CM | POA: Diagnosis not present

## 2020-10-04 DIAGNOSIS — M8448XD Pathological fracture, other site, subsequent encounter for fracture with routine healing: Secondary | ICD-10-CM | POA: Diagnosis not present

## 2020-10-04 DIAGNOSIS — S8012XD Contusion of left lower leg, subsequent encounter: Secondary | ICD-10-CM | POA: Diagnosis not present

## 2020-10-04 DIAGNOSIS — Z87891 Personal history of nicotine dependence: Secondary | ICD-10-CM | POA: Diagnosis not present

## 2020-10-04 DIAGNOSIS — Z8673 Personal history of transient ischemic attack (TIA), and cerebral infarction without residual deficits: Secondary | ICD-10-CM | POA: Diagnosis not present

## 2020-10-04 DIAGNOSIS — I509 Heart failure, unspecified: Secondary | ICD-10-CM | POA: Diagnosis not present

## 2020-10-04 DIAGNOSIS — M170 Bilateral primary osteoarthritis of knee: Secondary | ICD-10-CM | POA: Diagnosis not present

## 2020-10-04 DIAGNOSIS — Z950 Presence of cardiac pacemaker: Secondary | ICD-10-CM | POA: Diagnosis not present

## 2020-10-06 DIAGNOSIS — M8448XD Pathological fracture, other site, subsequent encounter for fracture with routine healing: Secondary | ICD-10-CM | POA: Diagnosis not present

## 2020-10-06 DIAGNOSIS — Z950 Presence of cardiac pacemaker: Secondary | ICD-10-CM | POA: Diagnosis not present

## 2020-10-06 DIAGNOSIS — Z87891 Personal history of nicotine dependence: Secondary | ICD-10-CM | POA: Diagnosis not present

## 2020-10-06 DIAGNOSIS — I509 Heart failure, unspecified: Secondary | ICD-10-CM | POA: Diagnosis not present

## 2020-10-06 DIAGNOSIS — M170 Bilateral primary osteoarthritis of knee: Secondary | ICD-10-CM | POA: Diagnosis not present

## 2020-10-06 DIAGNOSIS — S8012XD Contusion of left lower leg, subsequent encounter: Secondary | ICD-10-CM | POA: Diagnosis not present

## 2020-10-06 DIAGNOSIS — S8011XD Contusion of right lower leg, subsequent encounter: Secondary | ICD-10-CM | POA: Diagnosis not present

## 2020-10-06 DIAGNOSIS — E7800 Pure hypercholesterolemia, unspecified: Secondary | ICD-10-CM | POA: Diagnosis not present

## 2020-10-06 DIAGNOSIS — Z8673 Personal history of transient ischemic attack (TIA), and cerebral infarction without residual deficits: Secondary | ICD-10-CM | POA: Diagnosis not present

## 2020-10-10 DIAGNOSIS — Z8673 Personal history of transient ischemic attack (TIA), and cerebral infarction without residual deficits: Secondary | ICD-10-CM | POA: Diagnosis not present

## 2020-10-10 DIAGNOSIS — M8448XD Pathological fracture, other site, subsequent encounter for fracture with routine healing: Secondary | ICD-10-CM | POA: Diagnosis not present

## 2020-10-10 DIAGNOSIS — M170 Bilateral primary osteoarthritis of knee: Secondary | ICD-10-CM | POA: Diagnosis not present

## 2020-10-10 DIAGNOSIS — Z87891 Personal history of nicotine dependence: Secondary | ICD-10-CM | POA: Diagnosis not present

## 2020-10-10 DIAGNOSIS — E7800 Pure hypercholesterolemia, unspecified: Secondary | ICD-10-CM | POA: Diagnosis not present

## 2020-10-10 DIAGNOSIS — I509 Heart failure, unspecified: Secondary | ICD-10-CM | POA: Diagnosis not present

## 2020-10-10 DIAGNOSIS — S8012XD Contusion of left lower leg, subsequent encounter: Secondary | ICD-10-CM | POA: Diagnosis not present

## 2020-10-10 DIAGNOSIS — Z950 Presence of cardiac pacemaker: Secondary | ICD-10-CM | POA: Diagnosis not present

## 2020-10-10 DIAGNOSIS — S8011XD Contusion of right lower leg, subsequent encounter: Secondary | ICD-10-CM | POA: Diagnosis not present

## 2020-10-11 ENCOUNTER — Emergency Department (HOSPITAL_COMMUNITY)
Admission: EM | Admit: 2020-10-11 | Discharge: 2020-10-13 | Disposition: E | Payer: Medicare PPO | Attending: Emergency Medicine | Admitting: Emergency Medicine

## 2020-10-11 ENCOUNTER — Emergency Department (HOSPITAL_COMMUNITY): Payer: Medicare PPO

## 2020-10-11 ENCOUNTER — Encounter (HOSPITAL_COMMUNITY): Payer: Self-pay | Admitting: Nephrology

## 2020-10-11 DIAGNOSIS — R404 Transient alteration of awareness: Secondary | ICD-10-CM | POA: Diagnosis not present

## 2020-10-11 DIAGNOSIS — I619 Nontraumatic intracerebral hemorrhage, unspecified: Secondary | ICD-10-CM | POA: Diagnosis not present

## 2020-10-11 DIAGNOSIS — Z87891 Personal history of nicotine dependence: Secondary | ICD-10-CM | POA: Insufficient documentation

## 2020-10-11 DIAGNOSIS — I4891 Unspecified atrial fibrillation: Secondary | ICD-10-CM | POA: Diagnosis not present

## 2020-10-11 DIAGNOSIS — J90 Pleural effusion, not elsewhere classified: Secondary | ICD-10-CM | POA: Diagnosis not present

## 2020-10-11 DIAGNOSIS — R4182 Altered mental status, unspecified: Secondary | ICD-10-CM | POA: Diagnosis present

## 2020-10-11 DIAGNOSIS — R400 Somnolence: Secondary | ICD-10-CM

## 2020-10-11 DIAGNOSIS — E039 Hypothyroidism, unspecified: Secondary | ICD-10-CM | POA: Insufficient documentation

## 2020-10-11 DIAGNOSIS — S06360A Traumatic hemorrhage of cerebrum, unspecified, without loss of consciousness, initial encounter: Secondary | ICD-10-CM | POA: Diagnosis not present

## 2020-10-11 DIAGNOSIS — Z79899 Other long term (current) drug therapy: Secondary | ICD-10-CM | POA: Diagnosis not present

## 2020-10-11 DIAGNOSIS — R410 Disorientation, unspecified: Secondary | ICD-10-CM | POA: Diagnosis present

## 2020-10-11 DIAGNOSIS — R4020 Unspecified coma: Secondary | ICD-10-CM | POA: Diagnosis not present

## 2020-10-11 DIAGNOSIS — Z950 Presence of cardiac pacemaker: Secondary | ICD-10-CM

## 2020-10-11 DIAGNOSIS — Z8616 Personal history of COVID-19: Secondary | ICD-10-CM | POA: Diagnosis not present

## 2020-10-11 DIAGNOSIS — I110 Hypertensive heart disease with heart failure: Secondary | ICD-10-CM | POA: Diagnosis not present

## 2020-10-11 DIAGNOSIS — I517 Cardiomegaly: Secondary | ICD-10-CM | POA: Diagnosis not present

## 2020-10-11 DIAGNOSIS — R0689 Other abnormalities of breathing: Secondary | ICD-10-CM | POA: Diagnosis not present

## 2020-10-11 DIAGNOSIS — G936 Cerebral edema: Secondary | ICD-10-CM | POA: Diagnosis not present

## 2020-10-11 DIAGNOSIS — J811 Chronic pulmonary edema: Secondary | ICD-10-CM | POA: Diagnosis not present

## 2020-10-11 DIAGNOSIS — R000 Tachycardia, unspecified: Secondary | ICD-10-CM | POA: Diagnosis not present

## 2020-10-11 DIAGNOSIS — I639 Cerebral infarction, unspecified: Secondary | ICD-10-CM | POA: Diagnosis not present

## 2020-10-11 DIAGNOSIS — I5032 Chronic diastolic (congestive) heart failure: Secondary | ICD-10-CM | POA: Insufficient documentation

## 2020-10-11 DIAGNOSIS — I509 Heart failure, unspecified: Secondary | ICD-10-CM | POA: Diagnosis not present

## 2020-10-11 DIAGNOSIS — I10 Essential (primary) hypertension: Secondary | ICD-10-CM | POA: Diagnosis not present

## 2020-10-11 DIAGNOSIS — I2510 Atherosclerotic heart disease of native coronary artery without angina pectoris: Secondary | ICD-10-CM | POA: Insufficient documentation

## 2020-10-11 DIAGNOSIS — R29810 Facial weakness: Secondary | ICD-10-CM | POA: Diagnosis not present

## 2020-10-11 LAB — COMPREHENSIVE METABOLIC PANEL
ALT: 16 U/L (ref 0–44)
AST: 18 U/L (ref 15–41)
Albumin: 3.3 g/dL — ABNORMAL LOW (ref 3.5–5.0)
Alkaline Phosphatase: 55 U/L (ref 38–126)
Anion gap: 7 (ref 5–15)
BUN: 14 mg/dL (ref 8–23)
CO2: 26 mmol/L (ref 22–32)
Calcium: 9.5 mg/dL (ref 8.9–10.3)
Chloride: 110 mmol/L (ref 98–111)
Creatinine, Ser: 0.74 mg/dL (ref 0.44–1.00)
GFR, Estimated: >60 mL/min (ref >60)
Glucose, Bld: 146 mg/dL — ABNORMAL HIGH (ref 70–99)
Potassium: 3.5 mmol/L (ref 3.5–5.1)
Sodium: 143 mmol/L (ref 135–145)
Total Bilirubin: 0.8 mg/dL (ref 0.3–1.2)
Total Protein: 5.6 g/dL — ABNORMAL LOW (ref 6.5–8.1)

## 2020-10-11 LAB — CBC WITH DIFFERENTIAL/PLATELET
Abs Immature Granulocytes: 0.02 10*3/uL (ref 0.00–0.07)
Basophils Absolute: 0 10*3/uL (ref 0.0–0.1)
Basophils Relative: 0 %
Eosinophils Absolute: 0.1 10*3/uL (ref 0.0–0.5)
Eosinophils Relative: 1 %
HCT: 34.9 % — ABNORMAL LOW (ref 36.0–46.0)
Hemoglobin: 11.3 g/dL — ABNORMAL LOW (ref 12.0–15.0)
Immature Granulocytes: 0 %
Lymphocytes Relative: 17 %
Lymphs Abs: 1.1 10*3/uL (ref 0.7–4.0)
MCH: 32.6 pg (ref 26.0–34.0)
MCHC: 32.4 g/dL (ref 30.0–36.0)
MCV: 100.6 fL — ABNORMAL HIGH (ref 80.0–100.0)
Monocytes Absolute: 0.7 10*3/uL (ref 0.1–1.0)
Monocytes Relative: 11 %
Neutro Abs: 4.4 10*3/uL (ref 1.7–7.7)
Neutrophils Relative %: 71 %
Platelets: 233 10*3/uL (ref 150–400)
RBC: 3.47 MIL/uL — ABNORMAL LOW (ref 3.87–5.11)
RDW: 16.5 % — ABNORMAL HIGH (ref 11.5–15.5)
WBC: 6.3 10*3/uL (ref 4.0–10.5)
nRBC: 0 % (ref 0.0–0.2)

## 2020-10-11 LAB — URINALYSIS, ROUTINE W REFLEX MICROSCOPIC
Bilirubin Urine: NEGATIVE
Glucose, UA: 50 mg/dL — AB
Hgb urine dipstick: NEGATIVE
Ketones, ur: 5 mg/dL — AB
Leukocytes,Ua: NEGATIVE
Nitrite: NEGATIVE
Protein, ur: 100 mg/dL — AB
Specific Gravity, Urine: 1.01 (ref 1.005–1.030)
pH: 8 (ref 5.0–8.0)

## 2020-10-11 LAB — ETHANOL: Alcohol, Ethyl (B): <10 mg/dL (ref <10)

## 2020-10-11 MED ORDER — HALOPERIDOL 1 MG PO TABS: 0.5000 mg | ORAL_TABLET | ORAL | Status: DC | PRN

## 2020-10-11 MED ORDER — BIOTENE DRY MOUTH MT LIQD
15.0000 mL | OROMUCOSAL | Status: DC | PRN
Start: 2020-10-11 — End: 2020-10-11

## 2020-10-11 MED ORDER — MORPHINE SULFATE (PF) 2 MG/ML IV SOLN: 2.0000 mg | Freq: Once | INTRAVENOUS | Status: DC

## 2020-10-11 MED ORDER — HALOPERIDOL LACTATE 5 MG/ML IJ SOLN: 0.5000 mg | INTRAMUSCULAR | Status: DC | PRN

## 2020-10-11 MED ORDER — ACETAMINOPHEN 650 MG RE SUPP: 650.0000 mg | Freq: Four times a day (QID) | RECTAL | Status: DC | PRN

## 2020-10-11 MED ORDER — HALOPERIDOL LACTATE 2 MG/ML PO CONC
0.5000 mg | ORAL | Status: DC | PRN
  Filled 2020-10-11: qty 0.3

## 2020-10-11 MED ORDER — GLYCOPYRROLATE 0.2 MG/ML IJ SOLN: 0.2000 mg | INTRAMUSCULAR | Status: DC | PRN

## 2020-10-11 MED ORDER — GLYCOPYRROLATE 1 MG PO TABS
1.0000 mg | ORAL_TABLET | ORAL | Status: DC | PRN
  Filled 2020-10-11: qty 1

## 2020-10-11 MED ORDER — ACETAMINOPHEN 325 MG PO TABS: 650.0000 mg | ORAL_TABLET | Freq: Four times a day (QID) | ORAL | Status: DC | PRN

## 2020-10-11 MED ORDER — MORPHINE SULFATE (PF) 2 MG/ML IV SOLN: 1.0000 mg | INTRAVENOUS | Status: DC | PRN

## 2020-10-11 MED ORDER — ONDANSETRON HCL 4 MG/2ML IJ SOLN: 4.0000 mg | Freq: Four times a day (QID) | INTRAMUSCULAR | Status: DC | PRN

## 2020-10-11 MED ORDER — POLYVINYL ALCOHOL 1.4 % OP SOLN
1.0000 [drp] | Freq: Four times a day (QID) | OPHTHALMIC | Status: DC | PRN
  Filled 2020-10-11: qty 15

## 2020-10-11 MED ORDER — ONDANSETRON 4 MG PO TBDP: 4.0000 mg | ORAL_TABLET | Freq: Four times a day (QID) | ORAL | Status: DC | PRN

## 2020-10-13 NOTE — ED Triage Notes (Signed)
Pt arrives home via EMS. Husband called EMS due to altered mental status and slurred speech when he found her this morning. LKW 2300 last night. EMS reports nothing remarkable on stroke screen. Pt hx of UTI, appears anxious at this time. 20g IV established in LFA. Pt HTN with EMS but has not had BP meds this morning, hx of CHF. BP 150/100 HR 102 O2 100% RA RR 32 CBG 139 Temp 97.0 f

## 2020-10-13 NOTE — ED Notes (Signed)
All family has arrived at bedside comfort measures given to all family present

## 2020-10-13 NOTE — Consult Note (Signed)
[facility] ED Visit/ Consultation  Kelly Splinter MD  Referring Provider: Dr. Jeronimo Norma 27-Oct-2020  Chief Complaint: Slurred speech, AMS HPI: The patient is a 87 y.o. year-old w/ hx of HTN, SSS sp PPM, PMR, NASH, HL, recurrent UTI's, hx hemorrhagic CVA (2016), COVID-19 infection Nov 2021, chronic diast CHF sent to ER by EMS for AMS.  Reportedly some confusion baseline but not usually this confused. Pt was not able to provide hx to ED providers.  In ED SpO2 was 83% and 2L O2 placed. Then pt became deviated to the L side and showed WOB.  Sent for head CT which showed a large area of acute hemorrhage present centered within the left temporoparietal lobes,  8.6 x 5.1 x 6.4 cm w/ +surrounding edema, intraventricular extension w/ rightward midline shift measuring 1.5 cm at the level of the septum pellucidum; +medialization of the left uncus and trapping of the right lateral ventricle. NSurg called by ED provider and recommended that there was no Rx and patient would likely succumb from the bleed in a short period of time.  Pt was made a DNR and comfort medications were ordered and palliative care team was consulted.  We were asked to see the patient for admission and comfort care.    Pt seen in ED.  Shortly after meeting the family the patient's breathing pattern increased for a few minutes, and then decreased, then pt stopped breathing.  Auscultation and palpation showed no heart sounds and no pulses. she had occassional agonal breath, then that subsided.  Pt was pronounced dead at 2:33 pm.  Family was in the room at the time and questions were answered.    ROS n/a  Past Medical History      Past Medical History:  Diagnosis Date  . Anemia due to chronic blood loss 12/2019   rectal bleeding  . Bilateral lower extremity edema   . CAD (coronary artery disease) cardiologist-- dr Caryl Comes   a. LHC 10/2017 30% RCA otherwise OK.  (nonobstructive cad)  . Chronic diastolic  CHF (congestive heart failure) ([name])    followed by dr Caryl Comes  . Chronic venous insufficiency   . COVID-19 06/13/2020  . Dysrhythmia    atrial fibrillation  . Hemorrhoids   . History of adenomatous polyp of colon   . History of hemorrhagic cerebrovascular accident (CVA) without residual deficits 03/25/2015   (previous seen by neuologist--- dr Erlinda Hong   ICH left posterior parietal in setting  HTN/ and pt taking coumadin;   per pt no residual  . History of kidney stones   . History of recurrent UTIs   . HLD (hyperlipidemia)   . HTN (hypertension)    followed by dr Caryl Comes and pcp  . Hypothyroidism    followed by pcp  . NASH (nonalcoholic steatohepatitis)    hx medication induced hepatitis (vytorin per pt)  . OA (osteoarthritis)   . Pacemaker-[employer] 07/29/2011  implanted   followed by dr Caryl Comes  . Permanent atrial fibrillation [facility])    cardiology--- dr Caryl Comes--- hx several DCCVs  . PMR (polymyalgia rheumatica) ([place])    03-14-2020  per pt has not had any issues in several years  . Rectal bleeding   . sinus node dysfunction//post termination pauses   . SSS (sick sinus syndrome) ([place])    s/p  PPM 07-29-2011  . Thoracic aortic aneurysm (TAA) ([place])    a. followed by Dr. Servando Snare.   Past Surgical History       Past  Surgical History:  Procedure Laterality Date  . BIOPSY  02/23/2020   Procedure: BIOPSY;  Surgeon: Clarene Essex, MD;  Location: [facility] ENDOSCOPY;  Service: Endoscopy;;  . BREAST SURGERY    . CARDIAC CATHETERIZATION  12-05-2006  dr cooper   nonobstructive cad  . CARDIAC PACEMAKER PLACEMENT  07-29-2011   dr Valaria Good (dual)  . CARDIOVERSION  03/02/2012   Procedure: CARDIOVERSION;  Surgeon: Lelon Perla, MD;  Location: [facility] ENDOSCOPY;  Service: Cardiovascular;  Laterality: N/A;  . CARDIOVERSION  03/13/2012   Procedure: CARDIOVERSION;  Surgeon: Josue Hector, MD;  Location: [place];  Service: Cardiovascular;  Laterality: N/A;  .  CARDIOVERSION  04/15/2012   Procedure: CARDIOVERSION;  Surgeon: [name], MD;  Location: [place];  Service: Cardiovascular;  Laterality: N/A;  . CARDIOVERSION N/A 06/07/2014   Procedure: CARDIOVERSION;  Surgeon: [name], MD;  Location: [facility] ENDOSCOPY;  Service: Cardiovascular;  Laterality: N/A;  . CARDIOVERSION N/A 08/18/2014   Procedure: CARDIOVERSION;  Surgeon: [name], MD;  Location: [place];  Service: Cardiovascular;  Laterality: N/A;  . CARDIOVERSION N/A 09/06/2015   Procedure: CARDIOVERSION;  Surgeon: Jerline Pain, MD;  Location: [place];  Service: Cardiovascular;  Laterality: N/A;  . COLONOSCOPY N/A 02/23/2020   Procedure: COLONOSCOPY possible flex sig only;  Surgeon: Clarene Essex, MD;  Location: [facility] ENDOSCOPY;  Service: Endoscopy;  Laterality: N/A;  . CYSTOSCOPY WITH RETROGRADE PYELOGRAM, URETEROSCOPY AND STENT PLACEMENT Right 10/ 2017  @[facility]   . CYSTOSCOPY WITH STENT PLACEMENT Right 04/22/2016   Procedure: CYSTOSCOPY WITH RIGHT URETERAL STENT INSERTION;  Surgeon: [name], MD;  Location: [place];  Service: Urology;  Laterality: Right;  . CYSTOSCOPY WITH URETEROSCOPY, STONE BASKETRY AND STENT PLACEMENT Right 04/30/2016   Procedure: CYSTOSCOPY WITH URETEROSCOPY, STENT PLACEMENT REMOVAL;  Surgeon: [name], MD;  Location: [facility] ORS;  Service: Urology;  Laterality: Right;  . HEMORRHOID SURGERY N/A 03/15/2020   Procedure: 3 COLUMN HEMORRHOIDECTOMY;  Surgeon: Leighton Ruff, MD;  Location: [place];  Service: General;  Laterality: N/A;  . HERNIA REPAIR    . INCISION AND DRAINAGE BREAST ABSCESS  1973  . KYPHOPLASTY N/A 08/24/2020   Procedure: KYPHOPLASTY THORACIC SEVEN AND EIGHT;  Surgeon: Melina Schools, MD;  Location: [place];  Service: Orthopedics;  Laterality: N/A;  60 mins  . LEFT HEART CATH AND CORONARY ANGIOGRAPHY N/A 10/28/2017   Procedure: LEFT HEART CATH AND CORONARY ANGIOGRAPHY;  Surgeon: Lorretta Harp, MD;  Location: [place] CV LAB;  Service: Cardiovascular;  Laterality: N/A;  . PERMANENT PACEMAKER INSERTION N/A 07/29/2011   Procedure: PERMANENT PACEMAKER INSERTION;  Surgeon: [name], MD;  Location: [facility] CATH LAB;  Service: Cardiovascular;  Laterality: N/A;  . UMBILICAL HERNIA REPAIR  1950's   Family History       Family History  Problem Relation Age of Onset  . Coronary artery disease Mother   . Coronary artery disease Other   . Alzheimer's disease Other   . Coronary artery disease Brother   . Coronary artery disease Brother    Social History  reports that she quit smoking about 30 years ago. Her smoking use included cigarettes. She quit after 20.00 years of use. She has never used smokeless tobacco. She reports that she does not drink alcohol and does not use drugs. Allergies       Allergies  Allergen Reactions  . Penicillins Anaphylaxis    Has patient had a PCN reaction causing immediate rash, facial/tongue/throat swelling, SOB or  lightheadedness with hypotension: Yes Has patient had a PCN reaction causing severe rash involving mucus membranes or skin necrosis: No Has patient had a PCN reaction that required hospitalization No Has patient had a PCN reaction occurring within the last 10 years: No If all of the above answers are "NO", then may proceed with Cephalosporin use.   . Statins Other (See Comments)    Elevated LFT's  . Tylenol [Acetaminophen] Other (See Comments)    If taken with Vytorin at risk for liver damage  . Adhesive [Tape] Other (See Comments)    Irritates skin, redness  . Amiodarone Other (See Comments)    Unknown reaction  . Fosamax [Alendronate] Other (See Comments)  . Quinolones Other (See Comments)    Unknown reaction  . Codeine Other (See Comments)    feel funny, head is fuzzy   Home medications        Prior to Admission medications   Medication Sig Start Date End Date Taking? Authorizing Provider  acetaminophen (TYLENOL) 500 MG  tablet Take 1,000 mg by mouth every 6 (six) hours as needed for mild pain.    [provider]  diltiazem (CARDIZEM CD) 120 MG 24 hr capsule Take 1 capsule (120 mg total) by mouth 2 (two) times daily. 04/04/20   Baldwin Jamaica, PA-C  escitalopram (LEXAPRO) 10 MG tablet Take 10 mg by mouth at bedtime.    [provider]  feeding supplement, ENSURE ENLIVE, (ENSURE ENLIVE) LIQD Take 237 mLs by mouth 2 (two) times daily between meals. Patient taking differently: Take 237 mLs by mouth daily as needed (need something extra). 03/24/20   Guilford Shi, MD  furosemide (LASIX) 40 MG tablet Take 1 tablet (40 mg total) by mouth daily. Patient taking differently: Take 40 mg by mouth every other day. 05/03/20 08/01/20  Baldwin Jamaica, PA-C  hydrOXYzine (ATARAX/VISTARIL) 10 MG tablet Take 1 tablet (10 mg total) by mouth 3 (three) times daily as needed for anxiety. Patient not taking: Reported on 08/07/2020 03/24/20   Guilford Shi, MD  labetalol (NORMODYNE) 300 MG tablet TAKE 1 TABLET BY MOUTH IN THE MORNING AND 1 & 1/2 TABLET IN THE EVENING. Patient taking differently: Take 300 mg by mouth See admin instructions. Take 1 tablet (300 mg totally) by mouth in the morning; take 1.5 tablets (450 mg totally) by mouth in the evening 05/15/20   [name], MD  levothyroxine (SYNTHROID, LEVOTHROID) 25 MCG tablet Take 25 mcg by mouth daily before breakfast.     [provider]  potassium chloride (KLOR-CON) 10 MEQ tablet Take 2 tablets (20 mEq total) by mouth daily. 06/15/20 09/13/20  Jonetta Osgood, MD  psyllium (HYDROCIL/METAMUCIL) 95 % PACK Take 1 packet by mouth daily. Patient taking differently: Take 1 packet by mouth daily as needed for mild constipation. 03/25/20   Guilford Shi, MD  traMADol (ULTRAM) 50 MG tablet Take 50 mg by mouth every 6 (six) hours as needed for moderate pain.  04/25/20   [provider]       Exam Gen elderly  WF, not responsive, left gaze, tachypneic No rash, cyanosis or gangrene Sclera anicteric, throat w/ saliva collecting No jvd or bruits Chest clear bilat to bases RRR no MRG Abd soft ntnd no mass or ascites +bs GU defer MS no joint effusions or deformity Ext no LE edema, no wounds or ulcer Neuro is not responsive, not moving extremities No response to command    Na 143 K 3.5  Creat 0.7  Alb 3.3 Hb 11  WBC 6K   UA negative   CXR - IMPRESSION: Persistent pleural effusions bilaterally with consolidation concerning for superimposed pneumonia left lower lobe. Cardiomegaly with pulmonary vascular congestion. Mild interstitial edema. Question a degree of underlying congestive heart failure.   CT head 12:00 pm > IMPRESSION: Large area of acute hemorrhage centered within the left temporoparietal lobes with surrounding edema and mass effect and intraventricular extension. 1.5 cm rightward midline shift. Medialization of the left uncus. Trapping of light lateral ventricle.   BP 184/105  HR 109  RR 35  97.7 temp    Assessment/ Plan: 1. Hemorrhagic CVA - large L tempoparietal bleeding CVA w/ intraventricular extension and midline shift. NSurg called by ED and grave prognosis was given based on the head CT findings. Pt was made DNR and comfort measures were ordered.  Patient died shortly in the ED shortly after examination was completed. See death summary.  2. H/o prior hemorrhagic CVA - in 2016.   3. Hypothyroid - synthroid 4. HTN - on labetalol , lasix and Cardizem and Kdur 5. SSS sp PPM 6. Depression - lexapro     Kelly Splinter  MD 11-04-20, 2:42 PM

## 2020-10-13 NOTE — ED Notes (Signed)
Rounded on pt family, pt with gurgling respirations at this time. NRB placed as daughter stated they are waiting for rest of family to come. Dr Alvino Chapel aware

## 2020-10-13 NOTE — Progress Notes (Signed)
Responded to ED page to support family at bedside.  Patient passed.  Provided emotional and spiritual support as needed. Husband and other family presence. Facilitated information sharing between family and staff.  Chaplain available as needed.  Jaclynn Major, [place], [facility], Pager [phone]

## 2020-10-13 NOTE — ED Notes (Signed)
[employer] contacted and made aware of pt and need to turn off ICD

## 2020-10-13 NOTE — H&P (Deleted)
[facility] History & Physical  Kelly Splinter MD  Referring Provider: Dr. Jeronimo Norma 11-03-2020  Chief Complaint: Slurred speech, AMS HPI: The patient is a 87 y.o. year-old w/ hx of HTN, SSS sp PPM, PMR, NASH, HL, recurrent UTI's, hx hemorrhagic CVA (2016), COVID-19 infection Nov 2021, chronic diast CHF sent to ER by EMS for AMS.  Reportedly some confusion baseline but not usually this confused. Pt was not able to provide hx to ED providers.  In ED SpO2 was 83% and 2L O2 placed. Then pt became deviated to the L side and showed WOB.  Sent for head CT which showed a large area of acute hemorrhage present centered within the left temporoparietal lobes,  8.6 x 5.1 x 6.4 cm w/ +surrounding edema, intraventricular extension w/ rightward midline shift measuring 1.5 cm at the level of the septum pellucidum; +medialization of the left uncus and trapping of the right lateral ventricle. NSurg called by ED provider and recommended that there was no Rx and patient would likely succumb from the bleed in a short period of time.  Pt was made a DNR and comfort medications were ordered and palliative care team was consulted.  We were asked to see the patient for admission and comfort care.    Pt seen in ED.  Shortly after meeting the family the patient's breathing pattern increased for a few minutes, and then decreased, then pt stopped breathing.  Auscultation and palpation showed no heart sounds and no pulses. she had occassional agonal breath, then that subsided.  Pt was pronounced dead at 2:33 pm.  Family was in the room at the time and questions were answered.    ROS n/a  Past Medical History  Past Medical History:  Diagnosis Date  . Anemia due to chronic blood loss 12/2019   rectal bleeding  . Bilateral lower extremity edema   . CAD (coronary artery disease) cardiologist-- dr Caryl Comes   a. LHC 10/2017 30% RCA otherwise OK.  (nonobstructive cad)  . Chronic diastolic CHF (congestive  heart failure) ([place])    followed by dr Caryl Comes  . Chronic venous insufficiency   . COVID-19 06/13/2020  . Dysrhythmia    atrial fibrillation  . Hemorrhoids   . History of adenomatous polyp of colon   . History of hemorrhagic cerebrovascular accident (CVA) without residual deficits 03/25/2015   (previous seen by neuologist--- dr Erlinda Hong   ICH left posterior parietal in setting  HTN/ and pt taking coumadin;   per pt no residual  . History of kidney stones   . History of recurrent UTIs   . HLD (hyperlipidemia)   . HTN (hypertension)    followed by dr Caryl Comes and pcp  . Hypothyroidism    followed by pcp  . NASH (nonalcoholic steatohepatitis)    hx medication induced hepatitis (vytorin per pt)  . OA (osteoarthritis)   . Pacemaker-[employer] 07/29/2011  implanted   followed by dr Caryl Comes  . Permanent atrial fibrillation [facility])    cardiology--- dr Caryl Comes--- hx several DCCVs  . PMR (polymyalgia rheumatica) ([place])    03-14-2020  per pt has not had any issues in several years  . Rectal bleeding   . sinus node dysfunction//post termination pauses   . SSS (sick sinus syndrome) ([place])    s/p  PPM 07-29-2011  . Thoracic aortic aneurysm (TAA) ([place])    a. followed by Dr. Servando Snare.   Past Surgical History  Past Surgical History:  Procedure Laterality Date  . BIOPSY  02/23/2020   Procedure: BIOPSY;  Surgeon: Clarene Essex, MD;  Location: [facility] ENDOSCOPY;  Service: Endoscopy;;  . BREAST SURGERY    . CARDIAC CATHETERIZATION  12-05-2006  dr cooper   nonobstructive cad  . CARDIAC PACEMAKER PLACEMENT  07-29-2011   dr Valaria Good (dual)  . CARDIOVERSION  03/02/2012   Procedure: CARDIOVERSION;  Surgeon: Lelon Perla, MD;  Location: [facility] ENDOSCOPY;  Service: Cardiovascular;  Laterality: N/A;  . CARDIOVERSION  03/13/2012   Procedure: CARDIOVERSION;  Surgeon: Josue Hector, MD;  Location: [place];  Service: Cardiovascular;  Laterality: N/A;  . CARDIOVERSION  04/15/2012   Procedure: CARDIOVERSION;  Surgeon:  [name], MD;  Location: [place];  Service: Cardiovascular;  Laterality: N/A;  . CARDIOVERSION N/A 06/07/2014   Procedure: CARDIOVERSION;  Surgeon: [name], MD;  Location: [facility] ENDOSCOPY;  Service: Cardiovascular;  Laterality: N/A;  . CARDIOVERSION N/A 08/18/2014   Procedure: CARDIOVERSION;  Surgeon: [name], MD;  Location: [place];  Service: Cardiovascular;  Laterality: N/A;  . CARDIOVERSION N/A 09/06/2015   Procedure: CARDIOVERSION;  Surgeon: Jerline Pain, MD;  Location: [place];  Service: Cardiovascular;  Laterality: N/A;  . COLONOSCOPY N/A 02/23/2020   Procedure: COLONOSCOPY possible flex sig only;  Surgeon: Clarene Essex, MD;  Location: [facility] ENDOSCOPY;  Service: Endoscopy;  Laterality: N/A;  . CYSTOSCOPY WITH RETROGRADE PYELOGRAM, URETEROSCOPY AND STENT PLACEMENT Right 10/ 2017  @[facility]   . CYSTOSCOPY WITH STENT PLACEMENT Right 04/22/2016   Procedure: CYSTOSCOPY WITH RIGHT URETERAL STENT INSERTION;  Surgeon: [name], MD;  Location: [place];  Service: Urology;  Laterality: Right;  . CYSTOSCOPY WITH URETEROSCOPY, STONE BASKETRY AND STENT PLACEMENT Right 04/30/2016   Procedure: CYSTOSCOPY WITH URETEROSCOPY, STENT PLACEMENT REMOVAL;  Surgeon: [name], MD;  Location: [facility];  Service: Urology;  Laterality: Right;  . HEMORRHOID SURGERY N/A 03/15/2020   Procedure: 3 COLUMN HEMORRHOIDECTOMY;  Surgeon: Leighton Ruff, MD;  Location: [place];  Service: General;  Laterality: N/A;  . HERNIA REPAIR    . INCISION AND DRAINAGE BREAST ABSCESS  1973  . KYPHOPLASTY N/A 08/24/2020   Procedure: KYPHOPLASTY THORACIC SEVEN AND EIGHT;  Surgeon: Melina Schools, MD;  Location: [place];  Service: Orthopedics;  Laterality: N/A;  60 mins  . LEFT HEART CATH AND CORONARY ANGIOGRAPHY N/A 10/28/2017   Procedure: LEFT HEART CATH AND CORONARY ANGIOGRAPHY;  Surgeon: Lorretta Harp, MD;  Location: [place] [facility];  Service: Cardiovascular;  Laterality: N/A;  . PERMANENT  PACEMAKER INSERTION N/A 07/29/2011   Procedure: PERMANENT PACEMAKER INSERTION;  Surgeon: [name], MD;  Location: [facility] CATH LAB;  Service: Cardiovascular;  Laterality: N/A;  . UMBILICAL HERNIA REPAIR  1950's   Family History  Family History  Problem Relation Age of Onset  . Coronary artery disease Mother   . Coronary artery disease Other   . Alzheimer's disease Other   . Coronary artery disease Brother   . Coronary artery disease Brother    Social History  reports that she quit smoking about 30 years ago. Her smoking use included cigarettes. She quit after 20.00 years of use. She has never used smokeless tobacco. She reports that she does not drink alcohol and does not use drugs. Allergies  Allergies  Allergen Reactions  . Penicillins Anaphylaxis    Has patient had a PCN reaction causing immediate rash, facial/tongue/throat swelling, SOB or lightheadedness with hypotension: Yes Has patient had a PCN reaction causing severe rash involving mucus membranes or skin necrosis: No  Has patient had a PCN reaction that required hospitalization No Has patient had a PCN reaction occurring within the last 10 years: No If all of the above answers are "NO", then may proceed with Cephalosporin use.   . Statins Other (See Comments)    Elevated LFT's  . Tylenol [Acetaminophen] Other (See Comments)    If taken with Vytorin at risk for liver damage  . Adhesive [Tape] Other (See Comments)    Irritates skin, redness  . Amiodarone Other (See Comments)    Unknown reaction  . Fosamax [Alendronate] Other (See Comments)  . Quinolones Other (See Comments)    Unknown reaction  . Codeine Other (See Comments)    feel funny, head is fuzzy   Home medications Prior to Admission medications   Medication Sig Start Date End Date Taking? Authorizing Provider  acetaminophen (TYLENOL) 500 MG tablet Take 1,000 mg by mouth every 6 (six) hours as needed for mild pain.    [provider]  diltiazem  (CARDIZEM CD) 120 MG 24 hr capsule Take 1 capsule (120 mg total) by mouth 2 (two) times daily. 04/04/20   Baldwin Jamaica, PA-C  escitalopram (LEXAPRO) 10 MG tablet Take 10 mg by mouth at bedtime.    [provider]  feeding supplement, ENSURE ENLIVE, (ENSURE ENLIVE) LIQD Take 237 mLs by mouth 2 (two) times daily between meals. Patient taking differently: Take 237 mLs by mouth daily as needed (need something extra). 03/24/20   Guilford Shi, MD  furosemide (LASIX) 40 MG tablet Take 1 tablet (40 mg total) by mouth daily. Patient taking differently: Take 40 mg by mouth every other day. 05/03/20 08/01/20  Baldwin Jamaica, PA-C  hydrOXYzine (ATARAX/VISTARIL) 10 MG tablet Take 1 tablet (10 mg total) by mouth 3 (three) times daily as needed for anxiety. Patient not taking: Reported on 08/07/2020 03/24/20   Guilford Shi, MD  labetalol (NORMODYNE) 300 MG tablet TAKE 1 TABLET BY MOUTH IN THE MORNING AND 1 & 1/2 TABLET IN THE EVENING. Patient taking differently: Take 300 mg by mouth See admin instructions. Take 1 tablet (300 mg totally) by mouth in the morning; take 1.5 tablets (450 mg totally) by mouth in the evening 05/15/20   [name], MD  levothyroxine (SYNTHROID, LEVOTHROID) 25 MCG tablet Take 25 mcg by mouth daily before breakfast.     [provider]  potassium chloride (KLOR-CON) 10 MEQ tablet Take 2 tablets (20 mEq total) by mouth daily. 06/15/20 09/13/20  Jonetta Osgood, MD  psyllium (HYDROCIL/METAMUCIL) 95 % PACK Take 1 packet by mouth daily. Patient taking differently: Take 1 packet by mouth daily as needed for mild constipation. 03/25/20   Guilford Shi, MD  traMADol (ULTRAM) 50 MG tablet Take 50 mg by mouth every 6 (six) hours as needed for moderate pain.  04/25/20   [provider]       Exam Gen elderly WF, not responsive, left gaze, tachypneic No rash, cyanosis or gangrene Sclera anicteric, throat w/ saliva collecting No jvd or bruits Chest  clear bilat to bases RRR no MRG Abd soft ntnd no mass or ascites +bs GU defer MS no joint effusions or deformity Ext no LE edema, no wounds or ulcer Neuro is not responsive, not moving extremities No response to command    Na 143 K 3.5  Creat 0.7  Alb 3.3 Hb 11  WBC 6K   UA negative   CXR - IMPRESSION: Persistent pleural effusions bilaterally with consolidation concerning for superimposed pneumonia  left lower lobe. Cardiomegaly with pulmonary vascular congestion. Mild interstitial edema. Question a degree of underlying congestive heart failure.   CT head 12:00 pm > IMPRESSION: Large area of acute hemorrhage centered within the left temporoparietal lobes with surrounding edema and mass effect and intraventricular extension. 1.5 cm rightward midline shift. Medialization of the left uncus. Trapping of light lateral ventricle.   BP 184/105  HR 109  RR 35  97.7 temp    Assessment/ Plan: 1. Hemorrhagic CVA - large L tempoparietal bleeding CVA w/ intraventricular extension and midline shift.  Patient died shortly after examination was completed. See death summary.  2. H/o prior hemorrhagic CVA - in 2016.   3. Hypothyroid - synthroid 4. HTN - on labetalol , lasix and Cardizem and Kdur 5. SSS sp PPM 6. Depression - lexapro      Kelly Splinter  MD 2020-11-08, 2:42 PM

## 2020-10-13 NOTE — ED Notes (Signed)
Help get patient undressed on the monitor did ekg shown to the er doctor patient is resting with call bell in reach

## 2020-10-13 NOTE — ED Notes (Signed)
Upon entering room for urine collection noted pt with increased work of breathing and deviated  to left side. Pt unresponsive to pain Dr Alvino Chapel aware of pt condition.  Ct scan called and pt transported with nurse

## 2020-10-13 NOTE — ED Notes (Signed)
Family at bedside. Put pt on 2L oxygen since o2 was at 83 %

## 2020-10-13 NOTE — ED Provider Notes (Signed)
[facility] EMERGENCY DEPARTMENT Provider Note   CSN: [id number] Arrival date & time: 2020/10/27  0174     History Chief Complaint  Patient presents with  . Altered Mental Status    [name] [name] is a 87 y.o. female.  HPI Level 5 caveat due to altered mental status.  Reportedly sent in for mental status change.  Reportedly some confusion at baseline but usually not as confused.  Family questions whether it could be a urinary tract infection.  Patient is not able to really provide any history.    Past Medical History:  Diagnosis Date  . Anemia due to chronic blood loss 12/2019   rectal bleeding  . Bilateral lower extremity edema   . CAD (coronary artery disease) cardiologist-- dr Caryl Comes   a. LHC 10/2017 30% RCA otherwise OK.  (nonobstructive cad)  . Chronic diastolic CHF (congestive heart failure) ([place])    followed by dr Caryl Comes  . Chronic venous insufficiency   . COVID-19 06/13/2020  . Dysrhythmia    atrial fibrillation  . Hemorrhoids   . History of adenomatous polyp of colon   . History of hemorrhagic cerebrovascular accident (CVA) without residual deficits 03/25/2015   (previous seen by neuologist--- dr Erlinda Hong   ICH left posterior parietal in setting  HTN/ and pt taking coumadin;   per pt no residual  . History of kidney stones   . History of recurrent UTIs   . HLD (hyperlipidemia)   . HTN (hypertension)    followed by dr Caryl Comes and pcp  . Hypothyroidism    followed by pcp  . NASH (nonalcoholic steatohepatitis)    hx medication induced hepatitis (vytorin per pt)  . OA (osteoarthritis)   . Pacemaker-Medtronic 07/29/2011  implanted   followed by dr Caryl Comes  . Permanent atrial fibrillation [facility])    cardiology--- dr Caryl Comes--- hx several DCCVs  . PMR (polymyalgia rheumatica) ([place])    03-14-2020  per pt has not had any issues in several years  . Rectal bleeding   . sinus node dysfunction//post termination pauses   . SSS (sick sinus syndrome) ([place])    s/p  PPM  07-29-2011  . Thoracic aortic aneurysm (TAA) ([place])    a. followed by Dr. Servando Snare.    Patient Active Problem List   Diagnosis Date Noted  . Hemorrhagic stroke ([place]) Oct 27, 2020  . Altered mental status October 27, 2020  . Atrial fibrillation with RVR ([place]) 06/14/2020  . COVID 06/13/2020  . Hemorrhoids 03/24/2020  . AKI (acute kidney injury) ([place]) 03/16/2020  . Hypokalemia 03/16/2020  . Lower GI bleed   . Acute blood loss anemia   . Acute lower GI bleeding 02/21/2020  . Symptomatic anemia 02/21/2020  . Migraine equivalent 01/06/2017  . Calculus of kidney   . Chronic diastolic CHF (congestive heart failure) ([place])   . AAA (abdominal aortic aneurysm) without rupture ([place])   . PMR (polymyalgia rheumatica) ([facility])   . Hypothyroidism   . Calculus of ureter   . CAD in native artery   . Other specified hypothyroidism   . Elevated LFTs 04/22/2016  . Hydronephrosis 04/22/2016  . Renal lithiasis 04/22/2016  . UTI (urinary tract infection) 04/22/2016  . Acute kidney injury ([place]) 04/22/2016  . Atrial flutter ([place])   . Nontraumatic cortical hemorrhage of left cerebral hemisphere ([place]) 05/03/2015  . Chronic anticoagulation 05/03/2015  . Cardiac pacemaker in situ 05/03/2015  . Encounter for therapeutic drug monitoring 10/04/2013  . Dyspnea on exertion 07/26/2013  . Chest pain 02/04/2013  .  Orthostatic lightheadedness 06/25/2012  . Pacemaker-Medtronic 11/12/2011  . Ascending aorta enlargement ([place]) 08/12/2011  . Chronic diastolic heart failure ([place]) 12/27/2010  . Long term (current) use of anticoagulants 10/15/2010  . PREMATURE VENTRICULAR CONTRACTIONS 10/17/2009  . post termination pauses/.sinus bradycardia 11/17/2008  . Essential hypertension 04/28/2008  . Atrial fibrillation ([place]) 04/28/2008    Past Surgical History:  Procedure Laterality Date  . BIOPSY  02/23/2020   Procedure: BIOPSY;  Surgeon: Clarene Essex, MD;  Location: [facility] ENDOSCOPY;  Service: Endoscopy;;  . BREAST SURGERY    .  CARDIAC CATHETERIZATION  12-05-2006  dr cooper   nonobstructive cad  . CARDIAC PACEMAKER PLACEMENT  07-29-2011   dr Valaria Good (dual)  . CARDIOVERSION  03/02/2012   Procedure: CARDIOVERSION;  Surgeon: Lelon Perla, MD;  Location: [facility] ENDOSCOPY;  Service: Cardiovascular;  Laterality: N/A;  . CARDIOVERSION  03/13/2012   Procedure: CARDIOVERSION;  Surgeon: Josue Hector, MD;  Location: [place];  Service: Cardiovascular;  Laterality: N/A;  . CARDIOVERSION  04/15/2012   Procedure: CARDIOVERSION;  Surgeon: [name], MD;  Location: [facility];  Service: Cardiovascular;  Laterality: N/A;  . CARDIOVERSION N/A 06/07/2014   Procedure: CARDIOVERSION;  Surgeon: [name], MD;  Location: [facility] ENDOSCOPY;  Service: Cardiovascular;  Laterality: N/A;  . CARDIOVERSION N/A 08/18/2014   Procedure: CARDIOVERSION;  Surgeon: [name], MD;  Location: [place];  Service: Cardiovascular;  Laterality: N/A;  . CARDIOVERSION N/A 09/06/2015   Procedure: CARDIOVERSION;  Surgeon: Jerline Pain, MD;  Location: [place];  Service: Cardiovascular;  Laterality: N/A;  . COLONOSCOPY N/A 02/23/2020   Procedure: COLONOSCOPY possible flex sig only;  Surgeon: Clarene Essex, MD;  Location: [facility] END[facility];  Service: Endoscopy;  Laterality: N/A;  . CYSTOSCOPY WITH RETROGRADE PYELOGRAM, URETEROSCOPY AND STENT PLACEMENT Right 10/ 2017  @[facility]   . CYSTOSCOPY WITH STENT PLACEMENT Right 04/22/2016   Procedure: CYSTOSCOPY WITH RIGHT URETERAL STENT INSERTION;  Surgeon: [name], MD;  Location: [facility];  Service: Urology;  Laterality: Right;  . CYSTOSCOPY WITH URETEROSCOPY, STONE BASKETRY AND STENT PLACEMENT Right 04/30/2016   Procedure: CYSTOSCOPY WITH URETEROSCOPY, STENT PLACEMENT REMOVAL;  Surgeon: [name], MD;  Location: [facility];  Service: Urology;  Laterality: Right;  . HEMORRHOID SURGERY N/A 03/15/2020   Procedure: 3 COLUMN HEMORRHOIDECTOMY;  Surgeon: Leighton Ruff, MD;  Location: [place];  Service: General;  Laterality: N/A;  . HERNIA REPAIR    . INCISION AND DRAINAGE BREAST ABSCESS  1973  . KYPHOPLASTY N/A 08/24/2020   Procedure: KYPHOPLASTY THORACIC SEVEN AND EIGHT;  Surgeon: Melina Schools, MD;  Location: [facility];  Service: Orthopedics;  Laterality: N/A;  60 mins  . LEFT HEART CATH AND CORONARY ANGIOGRAPHY N/A 10/28/2017   Procedure: LEFT HEART CATH AND CORONARY ANGIOGRAPHY;  Surgeon: Lorretta Harp, MD;  Location: [facility] CV LAB;  Service: Cardiovascular;  Laterality: N/A;  . PERMANENT PACEMAKER INSERTION N/A 07/29/2011   Procedure: PERMANENT PACEMAKER INSERTION;  Surgeon: [name], MD;  Location: [facility] CATH LAB;  Service: Cardiovascular;  Laterality: N/A;  . UMBILICAL HERNIA REPAIR  1950's     OB History   No obstetric history on file.     Family History  Problem Relation Age of Onset  . Coronary artery disease Mother   . Coronary artery disease Other   . Alzheimer's disease Other   . Coronary artery disease Brother   . Coronary artery disease Brother     Social History   Tobacco Use  .  Smoking status: Former Smoker    Years: 20.00    Types: Cigarettes    Quit date: 07/16/1990    Years since quitting: 30.2  . Smokeless tobacco: Never Used  Vaping Use  . Vaping Use: Never used  Substance Use Topics  . Alcohol use: No  . Drug use: No    Home Medications Prior to Admission medications   Medication Sig Start Date End Date Taking? Authorizing Provider  acetaminophen (TYLENOL) 500 MG tablet Take 1,000 mg by mouth every 6 (six) hours as needed for mild pain.    [provider]  diltiazem (CARDIZEM CD) 120 MG 24 hr capsule Take 1 capsule (120 mg total) by mouth 2 (two) times daily. 04/04/20   Baldwin Jamaica, PA-C  escitalopram (LEXAPRO) 10 MG tablet Take 10 mg by mouth at bedtime.    [provider]  feeding supplement, ENSURE ENLIVE, (ENSURE ENLIVE) LIQD Take 237 mLs by mouth 2 (two) times daily between meals. Patient  taking differently: Take 237 mLs by mouth daily as needed (need something extra). 03/24/20   Guilford Shi, MD  furosemide (LASIX) 40 MG tablet Take 1 tablet (40 mg total) by mouth daily. Patient taking differently: Take 40 mg by mouth every other day. 05/03/20 08/01/20  Baldwin Jamaica, PA-C  hydrOXYzine (ATARAX/VISTARIL) 10 MG tablet Take 1 tablet (10 mg total) by mouth 3 (three) times daily as needed for anxiety. Patient not taking: Reported on 08/07/2020 03/24/20   Guilford Shi, MD  labetalol (NORMODYNE) 300 MG tablet TAKE 1 TABLET BY MOUTH IN THE MORNING AND 1 & 1/2 TABLET IN THE EVENING. Patient taking differently: Take 300 mg by mouth See admin instructions. Take 1 tablet (300 mg totally) by mouth in the morning; take 1.5 tablets (450 mg totally) by mouth in the evening 05/15/20   [name], MD  levothyroxine (SYNTHROID, LEVOTHROID) 25 MCG tablet Take 25 mcg by mouth daily before breakfast.     [provider]  potassium chloride (KLOR-CON) 10 MEQ tablet Take 2 tablets (20 mEq total) by mouth daily. 06/15/20 09/13/20  Jonetta Osgood, MD  psyllium (HYDROCIL/METAMUCIL) 95 % PACK Take 1 packet by mouth daily. Patient taking differently: Take 1 packet by mouth daily as needed for mild constipation. 03/25/20   Guilford Shi, MD  traMADol (ULTRAM) 50 MG tablet Take 50 mg by mouth every 6 (six) hours as needed for moderate pain.  04/25/20   [provider]    Allergies    Penicillins, Statins, Tylenol [acetaminophen], Adhesive [tape], Amiodarone, Fosamax [alendronate], Quinolones, and Codeine  Review of Systems   Review of Systems  Unable to perform ROS: Mental status change    Physical Exam Updated Vital Signs BP (!) 184/105   Pulse 60   Temp 97.7 F (36.5 C) (Rectal)   Resp (!) 8   SpO2 (!) 66%   Physical Exam Vitals and nursing note reviewed.  Constitutional:      Appearance: Normal appearance.  HENT:     Head: Atraumatic.     Mouth/Throat:      Mouth: Mucous membranes are moist.  Eyes:     Extraocular Movements: Extraocular movements intact.  Cardiovascular:     Rate and Rhythm: Tachycardia present.  Pulmonary:     Breath sounds: No wheezing or rhonchi.  Abdominal:     Tenderness: There is no abdominal tenderness.  Musculoskeletal:        General: No tenderness.     Cervical back: Neck supple.  Skin:    General: Skin is warm.     Capillary Refill: Capillary refill takes less than 2 seconds.  Neurological:     Mental Status: She is alert.     Comments: Awake and pleasant, but confused.  Moving around.  Appears to be moving bilaterally.  Primarily using left side but per nursing of been using right side more to fight against her IV.     ED Results / Procedures / Treatments   Labs (all labs ordered are listed, but only abnormal results are displayed) Labs Reviewed  COMPREHENSIVE METABOLIC PANEL - Abnormal; Notable for the following components:      Result Value   Glucose, Bld 146 (*)    Total Protein 5.6 (*)    Albumin 3.3 (*)    All other components within normal limits  CBC WITH DIFFERENTIAL/PLATELET - Abnormal; Notable for the following components:   RBC 3.47 (*)    Hemoglobin 11.3 (*)    HCT 34.9 (*)    MCV 100.6 (*)    RDW 16.5 (*)    All other components within normal limits  URINALYSIS, ROUTINE W REFLEX MICROSCOPIC - Abnormal; Notable for the following components:   APPearance CLOUDY (*)    Glucose, UA 50 (*)    Ketones, ur 5 (*)    Protein, ur 100 (*)    Bacteria, UA RARE (*)    All other components within normal limits  ETHANOL    EKG EKG Interpretation  Date/Time:  31-Oct-2020 09:17:10 EDT Ventricular Rate:  96 PR Interval:    QRS Duration: 105 QT Interval:  392 QTC Calculation: 509 R Axis:   -18 Text Interpretation: Atrial fibrillation Ventricular premature complex LVH with secondary repolarization abnormality Anterior infarct, old Prolonged QT interval Confirmed by Davonna Belling [phone]) on 10-31-20 12:28:58 PM   Radiology CT Head Wo Contrast  Result Date: 10/31/20 CLINICAL DATA:  New unresponsiveness EXAM: CT HEAD WITHOUT CONTRAST TECHNIQUE: Contiguous axial images were obtained from the base of the skull through the vertex without intravenous contrast. COMPARISON:  None. FINDINGS: Brain: Large area of acute hemorrhage is present centered within the left temporoparietal lobes. Approximate measurements of 8.6 x 5.1 x 6.4 cm. There is surrounding edema. Intraventricular extension is present. There is rightward midline shift measuring 1.5 cm at the level of the septum pellucidum. There is medial ization of the left uncus. Trapping of the right lateral ventricle. Gray-white differentiation is preserved. Vascular: No hyperdense vessel. There is intracranial atherosclerotic calcification at the skull base. Skull: Calvarium is unremarkable. Sinuses/Orbits: No acute finding. Other: None. IMPRESSION: Large area of acute hemorrhage centered within the left temporoparietal lobes with surrounding edema and mass effect and intraventricular extension. 1.5 cm rightward midline shift. Medialization of the left uncus. Trapping of light lateral ventricle. These results were called by telephone at the time of interpretation on 2020/10/31 at 12:19 pm to provider [facility] , who verbally acknowledged these results. Electronically Signed   By: Macy Mis M.D.   On: 10-31-2020 12:24   DG Chest Portable 1 View  Result Date: 10/31/20 CLINICAL DATA:  Altered mental status EXAM: PORTABLE CHEST 1 VIEW COMPARISON:  August 11, 2020 FINDINGS: There is persistent left pleural effusion with minimal right pleural effusion. There is consolidation in the left lower lobe region. There is suspected mild interstitial edema bilaterally. Heart is prominent with mild pulmonary venous hypertension. Pacemaker leads are attached to the right atrium and right ventricle. No adenopathy. There is aortic  atherosclerosis. Bones are osteoporotic. Status post kyphoplasty procedure in mid to lower thoracic regions. IMPRESSION: Persistent pleural effusions bilaterally with consolidation concerning for superimposed pneumonia left lower lobe. Cardiomegaly with pulmonary vascular congestion. Mild interstitial edema. Question a degree of underlying congestive heart failure. Pacemaker leads attached to right atrium and right ventricle. Bones osteoporotic. Aortic Atherosclerosis (ICD10-I70.0). Electronically Signed   By: Lowella Grip III M.D.   On: 2020/10/15 10:17    Procedures Procedures   Medications Ordered in ED Medications  morphine 2 MG/ML injection 2 mg (has no administration in time range)  acetaminophen (TYLENOL) tablet 650 mg (has no administration in time range)    Or  acetaminophen (TYLENOL) suppository 650 mg (has no administration in time range)  haloperidol (HALDOL) tablet 0.5 mg (has no administration in time range)    Or  haloperidol (HALDOL) 2 MG/ML solution 0.5 mg (has no administration in time range)    Or  haloperidol lactate (HALDOL) injection 0.5 mg (has no administration in time range)  ondansetron (ZOFRAN-ODT) disintegrating tablet 4 mg (has no administration in time range)    Or  ondansetron (ZOFRAN) injection 4 mg (has no administration in time range)  glycopyrrolate (ROBINUL) tablet 1 mg (has no administration in time range)    Or  glycopyrrolate (ROBINUL) injection 0.2 mg (has no administration in time range)    Or  glycopyrrolate (ROBINUL) injection 0.2 mg (has no administration in time range)  antiseptic oral rinse (BIOTENE) solution 15 mL (has no administration in time range)  polyvinyl alcohol (LIQUIFILM TEARS) 1.4 % ophthalmic solution 1 drop (has no administration in time range)  morphine 2 MG/ML injection 1 mg (has no administration in time range)    ED Course  I have reviewed the triage vital signs and the nursing notes.  Pertinent labs & imaging  results that were available during my care of the patient were reviewed by me and considered in my medical decision making (see chart for details).    MDM Rules/Calculators/A&P                          Patient came in with mental status change.  Initial confusion.  May have been some decrease in movement on the right side but per nurse have been wrestling with her for the IV.  However patient was still look to me and still talking.  Chest x-ray showed possible pneumonia but patient's not been coughing.  Patient's husband later arrived and talked with him and stated that she had been complaining of a headache this morning.  Head CT ordered at that time.  Patient had a decompensation in her status and CT done urgently.  Shows large hemorrhagic stroke.  Intraventricular extension with herniation.  Discussed with patient's husband that this is likely fatal.  I think it is best to patient is made DNR and go to comfort care but will discuss with neurosurgery.  Discussed with Dr. Saintclair Halsted.  This is a nonrecoverable injury since it is involving the dominant hemisphere and with the size and extension.  Discussed with patient's husband and her daughter.  Made patient a DNR.  There is another family member that is likely can come from [place] of time course of this but likely does not have long before she dies.  Have consulted palliative care but have not heard back yet.  Will admit to hospitalist Final Clinical Impression(s) / ED Diagnoses Final diagnoses:  Hemorrhagic stroke ([place])  Rx / DC Orders ED Discharge Orders    None       Davonna Belling, MD 10/31/20 1540

## 2020-10-13 NOTE — Progress Notes (Signed)
Palliative Medicine RN Note: Consult order noted for EOL symptom mgmt. Our team does not have designated ED coverage today, and our providers are aware of the consult on [name] [name]. I have messaged Dr Alvino Chapel to let him know.  If symptom mgmt is needed before we can get there, the End of Life order set can be initiated.  Marjie Skiff Shareese Macha, RN, BSN, [facility] Palliative Medicine Team 2020-10-25 1:39 PM Office [phone]

## 2020-10-13 NOTE — Death Summary Note (Signed)
DEATH SUMMARY   Patient Details  Name: [name] [name] MRN: [id number] DOB: [birth year]  Admission/Discharge Information   Admit Date:  11-03-2020  Date of Death:  2020/11/03  Time of Death:  2:33 PM  Length of Stay: 0  Referring Physician: [name], MD   Reason(s) for Hospitalization  Slurred speech and altered mental status  Diagnoses  Preliminary cause of death:  Secondary Diagnoses (including complications and co-morbidities):  Active Problems:   Hemorrhagic stroke [facility])   Altered mental status   Brief Hospital Course (including significant findings, care, treatment, and services provided and events leading to death)  [name] [name] is a 87 y.o. year old female w/  hx of HTN, SSS sp PPM, PMR, NASH, HL, recurrent UTI's, hx hemorrhagic CVA (2016), COVID-19 infection Nov 2021, chronic diast CHF sent to ER by EMS for AMS.  Reportedly some confusion baseline but not usually this confused. Pt was not able to provide hx to ED providers.  In ED SpO2 was 83% and 2L O2 placed. Labs were done and UA pending. Before noon, the pt became deviated to the L side and showed WOB.  Sent for head CT which showed a large area of acute hemorrhage present centered within the left temporoparietal lobes,  8.6 x 5.1 x 6.4 cm w/ +surrounding edema, intraventricular extension w/ rightward midline shift measuring 1.5 cm at the level of the septum pellucidum; +medialization of the left uncus and trapping of the right lateral ventricle. NSurgery was called by ED provider. They recommended that there was no Rx and that the patient would likely succumb from the bleed in a short period of time.  Pt was made a DNR and comfort medications were ordered and palliative care team was consulted.  We were asked to see the patient for admission and comfort care.    Pt seen in ED at around 2:20pm.  The patient was unresponsive w/ a left-sided upwards gaze and mild tachypnea. Exam was done w/o sig findings aside from  non-responsiveness and upward gaze. Shortly after meeting the family the patient's breathing pattern became irregular , fast and hectic. Then her breathing slowed gradually. Then the pt stopped breathing.  Auscultation and palpation showed no heart sounds and no pulses. There was a few occassional agonal breaths, which then subsided.  Pt was pronounced dead at 2:33 pm.  Family was in the room at the time and questions were answered.     Pertinent Labs and Studies  Significant Diagnostic Studies CT Head Wo Contrast  Result Date: November 03, 2020 CLINICAL DATA:  New unresponsiveness EXAM: CT HEAD WITHOUT CONTRAST TECHNIQUE: Contiguous axial images were obtained from the base of the skull through the vertex without intravenous contrast. COMPARISON:  None. FINDINGS: Brain: Large area of acute hemorrhage is present centered within the left temporoparietal lobes. Approximate measurements of 8.6 x 5.1 x 6.4 cm. There is surrounding edema. Intraventricular extension is present. There is rightward midline shift measuring 1.5 cm at the level of the septum pellucidum. There is medial ization of the left uncus. Trapping of the right lateral ventricle. Gray-white differentiation is preserved. Vascular: No hyperdense vessel. There is intracranial atherosclerotic calcification at the skull base. Skull: Calvarium is unremarkable. Sinuses/Orbits: No acute finding. Other: None. IMPRESSION: Large area of acute hemorrhage centered within the left temporoparietal lobes with surrounding edema and mass effect and intraventricular extension. 1.5 cm rightward midline shift. Medialization of the left uncus. Trapping of light lateral ventricle. These results were called by telephone at the time  of interpretation on October 21, 2020 at 12:19 pm to provider Davonna Belling , who verbally acknowledged these results. Electronically Signed   By: Macy Mis M.D.   On: Oct 21, 2020 12:24   DG Chest Portable 1 View  Result Date: 21-Oct-2020 CLINICAL  DATA:  Altered mental status EXAM: PORTABLE CHEST 1 VIEW COMPARISON:  August 11, 2020 FINDINGS: There is persistent left pleural effusion with minimal right pleural effusion. There is consolidation in the left lower lobe region. There is suspected mild interstitial edema bilaterally. Heart is prominent with mild pulmonary venous hypertension. Pacemaker leads are attached to the right atrium and right ventricle. No adenopathy. There is aortic atherosclerosis. Bones are osteoporotic. Status post kyphoplasty procedure in mid to lower thoracic regions. IMPRESSION: Persistent pleural effusions bilaterally with consolidation concerning for superimposed pneumonia left lower lobe. Cardiomegaly with pulmonary vascular congestion. Mild interstitial edema. Question a degree of underlying congestive heart failure. Pacemaker leads attached to right atrium and right ventricle. Bones osteoporotic. Aortic Atherosclerosis (ICD10-I70.0). Electronically Signed   By: Lowella Grip III M.D.   On: 10/21/20 10:17    Microbiology No results found for this or any previous visit (from the past 240 hour(s)).  Lab Basic Metabolic Panel: Recent Labs  Lab Oct 21, 2020 0942  NA 143  K 3.5  CL 110  CO2 26  GLUCOSE 146*  BUN 14  CREATININE 0.74  CALCIUM 9.5   Liver Function Tests: Recent Labs  Lab 2020/10/21 0942  AST 18  ALT 16  ALKPHOS 55  BILITOT 0.8  PROT 5.6*  ALBUMIN 3.3*   No results for input(s): LIPASE, AMYLASE in the last 168 hours. No results for input(s): AMMONIA in the last 168 hours. CBC: Recent Labs  Lab 2020-10-21 0942  WBC 6.3  NEUTROABS 4.4  HGB 11.3*  HCT 34.9*  MCV 100.6*  PLT 233   Cardiac Enzymes: No results for input(s): CKTOTAL, CKMB, CKMBINDEX, TROPONINI in the last 168 hours. Sepsis Labs: Recent Labs  Lab 10-21-20 0942  WBC 6.3    Procedures/Operations  None.    [name] [name] Alvester Eads 10/21/2020, 3:08 PM

## 2020-10-13 DEATH — deceased
